# Patient Record
Sex: Male | Born: 1950 | Race: Black or African American | Hispanic: No | Marital: Single | Smoking: Never smoker
Health system: Southern US, Community
[De-identification: ages and names within clinical notes are randomized; demographics above are authoritative.]

## PROBLEM LIST (undated history)

## (undated) DIAGNOSIS — R569 Unspecified convulsions: Secondary | ICD-10-CM

## (undated) DIAGNOSIS — R7309 Other abnormal glucose: Secondary | ICD-10-CM

## (undated) DIAGNOSIS — I639 Cerebral infarction, unspecified: Secondary | ICD-10-CM

## (undated) DIAGNOSIS — I69391 Dysphagia following cerebral infarction: Secondary | ICD-10-CM

## (undated) DIAGNOSIS — N390 Urinary tract infection, site not specified: Secondary | ICD-10-CM

## (undated) DIAGNOSIS — D62 Acute posthemorrhagic anemia: Secondary | ICD-10-CM

## (undated) DIAGNOSIS — E139 Other specified diabetes mellitus without complications: Secondary | ICD-10-CM

## (undated) DIAGNOSIS — I6930 Unspecified sequelae of cerebral infarction: Secondary | ICD-10-CM

## (undated) DIAGNOSIS — E7800 Pure hypercholesterolemia, unspecified: Secondary | ICD-10-CM

## (undated) DIAGNOSIS — E119 Type 2 diabetes mellitus without complications: Secondary | ICD-10-CM

## (undated) DIAGNOSIS — I10 Essential (primary) hypertension: Secondary | ICD-10-CM

## (undated) HISTORY — DX: Unspecified convulsions: R56.9

## (undated) HISTORY — DX: Other abnormal glucose: R73.09

## (undated) HISTORY — DX: Pure hypercholesterolemia, unspecified: E78.00

---

## 2018-03-30 ENCOUNTER — Inpatient Hospital Stay (HOSPITAL_COMMUNITY): Payer: Medicare Other

## 2018-03-30 ENCOUNTER — Encounter (HOSPITAL_COMMUNITY): Payer: Self-pay | Admitting: Registered Nurse

## 2018-03-30 ENCOUNTER — Emergency Department (HOSPITAL_COMMUNITY): Payer: Medicare Other

## 2018-03-30 ENCOUNTER — Encounter (HOSPITAL_COMMUNITY)
Admission: EM | Disposition: A | Discharge status: Rehabilitation Facility | Payer: Self-pay | Source: Home / Self Care | Attending: Neurology

## 2018-03-30 ENCOUNTER — Other Ambulatory Visit: Payer: Self-pay

## 2018-03-30 ENCOUNTER — Inpatient Hospital Stay (HOSPITAL_COMMUNITY)
Admission: EM | Admit: 2018-03-30 | Discharge: 2018-04-04 | DRG: 061 | Disposition: A | Discharge status: Rehabilitation Facility | Payer: Medicare Other | Attending: Neurology | Admitting: Neurology

## 2018-03-30 ENCOUNTER — Encounter (HOSPITAL_COMMUNITY): Payer: Self-pay

## 2018-03-30 DIAGNOSIS — N390 Urinary tract infection, site not specified: Secondary | ICD-10-CM

## 2018-03-30 DIAGNOSIS — I63312 Cerebral infarction due to thrombosis of left middle cerebral artery: Secondary | ICD-10-CM | POA: Diagnosis not present

## 2018-03-30 DIAGNOSIS — I63412 Cerebral infarction due to embolism of left middle cerebral artery: Secondary | ICD-10-CM | POA: Diagnosis present

## 2018-03-30 DIAGNOSIS — I63512 Cerebral infarction due to unspecified occlusion or stenosis of left middle cerebral artery: Secondary | ICD-10-CM | POA: Diagnosis not present

## 2018-03-30 DIAGNOSIS — G8191 Hemiplegia, unspecified affecting right dominant side: Secondary | ICD-10-CM | POA: Diagnosis present

## 2018-03-30 DIAGNOSIS — N3090 Cystitis, unspecified without hematuria: Secondary | ICD-10-CM | POA: Diagnosis not present

## 2018-03-30 DIAGNOSIS — Z8673 Personal history of transient ischemic attack (TIA), and cerebral infarction without residual deficits: Secondary | ICD-10-CM

## 2018-03-30 DIAGNOSIS — E1165 Type 2 diabetes mellitus with hyperglycemia: Secondary | ICD-10-CM | POA: Diagnosis present

## 2018-03-30 DIAGNOSIS — R1310 Dysphagia, unspecified: Secondary | ICD-10-CM | POA: Diagnosis present

## 2018-03-30 DIAGNOSIS — G936 Cerebral edema: Secondary | ICD-10-CM | POA: Diagnosis present

## 2018-03-30 DIAGNOSIS — R0989 Other specified symptoms and signs involving the circulatory and respiratory systems: Secondary | ICD-10-CM | POA: Diagnosis not present

## 2018-03-30 DIAGNOSIS — E785 Hyperlipidemia, unspecified: Secondary | ICD-10-CM | POA: Diagnosis present

## 2018-03-30 DIAGNOSIS — R29810 Facial weakness: Secondary | ICD-10-CM | POA: Diagnosis present

## 2018-03-30 DIAGNOSIS — R29728 NIHSS score 28: Secondary | ICD-10-CM | POA: Diagnosis present

## 2018-03-30 DIAGNOSIS — I69398 Other sequelae of cerebral infarction: Secondary | ICD-10-CM

## 2018-03-30 DIAGNOSIS — I69391 Dysphagia following cerebral infarction: Secondary | ICD-10-CM

## 2018-03-30 DIAGNOSIS — E1142 Type 2 diabetes mellitus with diabetic polyneuropathy: Secondary | ICD-10-CM | POA: Diagnosis not present

## 2018-03-30 DIAGNOSIS — I6389 Other cerebral infarction: Secondary | ICD-10-CM | POA: Diagnosis not present

## 2018-03-30 DIAGNOSIS — E1159 Type 2 diabetes mellitus with other circulatory complications: Secondary | ICD-10-CM | POA: Diagnosis present

## 2018-03-30 DIAGNOSIS — Z794 Long term (current) use of insulin: Secondary | ICD-10-CM

## 2018-03-30 DIAGNOSIS — I639 Cerebral infarction, unspecified: Secondary | ICD-10-CM | POA: Diagnosis present

## 2018-03-30 DIAGNOSIS — I10 Essential (primary) hypertension: Secondary | ICD-10-CM | POA: Diagnosis present

## 2018-03-30 DIAGNOSIS — R509 Fever, unspecified: Secondary | ICD-10-CM

## 2018-03-30 DIAGNOSIS — E139 Other specified diabetes mellitus without complications: Secondary | ICD-10-CM | POA: Diagnosis present

## 2018-03-30 DIAGNOSIS — I6930 Unspecified sequelae of cerebral infarction: Secondary | ICD-10-CM

## 2018-03-30 DIAGNOSIS — N179 Acute kidney failure, unspecified: Secondary | ICD-10-CM | POA: Diagnosis present

## 2018-03-30 DIAGNOSIS — H5347 Heteronymous bilateral field defects: Secondary | ICD-10-CM | POA: Diagnosis present

## 2018-03-30 DIAGNOSIS — I69320 Aphasia following cerebral infarction: Secondary | ICD-10-CM | POA: Diagnosis not present

## 2018-03-30 DIAGNOSIS — D62 Acute posthemorrhagic anemia: Secondary | ICD-10-CM | POA: Diagnosis not present

## 2018-03-30 DIAGNOSIS — E1169 Type 2 diabetes mellitus with other specified complication: Secondary | ICD-10-CM | POA: Diagnosis present

## 2018-03-30 DIAGNOSIS — I69998 Other sequelae following unspecified cerebrovascular disease: Secondary | ICD-10-CM | POA: Diagnosis not present

## 2018-03-30 DIAGNOSIS — I152 Hypertension secondary to endocrine disorders: Secondary | ICD-10-CM | POA: Diagnosis present

## 2018-03-30 DIAGNOSIS — R4701 Aphasia: Secondary | ICD-10-CM | POA: Diagnosis present

## 2018-03-30 DIAGNOSIS — N289 Disorder of kidney and ureter, unspecified: Secondary | ICD-10-CM

## 2018-03-30 DIAGNOSIS — I6300 Cerebral infarction due to thrombosis of unspecified precerebral artery: Secondary | ICD-10-CM | POA: Diagnosis not present

## 2018-03-30 DIAGNOSIS — R7309 Other abnormal glucose: Secondary | ICD-10-CM | POA: Diagnosis not present

## 2018-03-30 DIAGNOSIS — I69351 Hemiplegia and hemiparesis following cerebral infarction affecting right dominant side: Secondary | ICD-10-CM | POA: Diagnosis not present

## 2018-03-30 DIAGNOSIS — E669 Obesity, unspecified: Secondary | ICD-10-CM | POA: Diagnosis not present

## 2018-03-30 HISTORY — DX: Essential (primary) hypertension: I10

## 2018-03-30 HISTORY — DX: Cerebral infarction, unspecified: I63.9

## 2018-03-30 HISTORY — DX: Type 2 diabetes mellitus without complications: E11.9

## 2018-03-30 HISTORY — PX: IR PATIENT EVAL TECH 0-60 MINS: IMG5564

## 2018-03-30 LAB — PROTIME-INR
INR: 1.04
Prothrombin Time: 13.5 seconds (ref 11.4–15.2)

## 2018-03-30 LAB — DIFFERENTIAL
Abs Immature Granulocytes: 0 10*3/uL (ref 0.00–0.07)
Basophils Absolute: 0 10*3/uL (ref 0.0–0.1)
Basophils Relative: 0 %
Eosinophils Absolute: 0 10*3/uL (ref 0.0–0.5)
Eosinophils Relative: 0 %
Lymphocytes Relative: 41 %
Lymphs Abs: 2.4 10*3/uL (ref 0.7–4.0)
Monocytes Absolute: 0.3 10*3/uL (ref 0.1–1.0)
Monocytes Relative: 5 %
NEUTROS ABS: 3.2 10*3/uL (ref 1.7–7.7)
Neutrophils Relative %: 54 %
nRBC: 0 /100 WBC

## 2018-03-30 LAB — GLUCOSE, CAPILLARY
Glucose-Capillary: 306 mg/dL — ABNORMAL HIGH (ref 70–99)
Glucose-Capillary: 314 mg/dL — ABNORMAL HIGH (ref 70–99)
Glucose-Capillary: 203 mg/dL — ABNORMAL HIGH (ref 70–99)

## 2018-03-30 LAB — CBC
HCT: 45 % (ref 39.0–52.0)
Hemoglobin: 13.9 g/dL (ref 13.0–17.0)
MCH: 28.1 pg (ref 26.0–34.0)
MCHC: 30.9 g/dL (ref 30.0–36.0)
MCV: 91.1 fL (ref 80.0–100.0)
PLATELETS: 224 10*3/uL (ref 150–400)
RBC: 4.94 MIL/uL (ref 4.22–5.81)
RDW: 12.6 % (ref 11.5–15.5)
WBC: 5.9 10*3/uL (ref 4.0–10.5)
nRBC: 0 % (ref 0.0–0.2)

## 2018-03-30 LAB — CBG MONITORING, ED: Glucose-Capillary: 219 mg/dL — ABNORMAL HIGH (ref 70–99)

## 2018-03-30 LAB — COMPREHENSIVE METABOLIC PANEL
ALBUMIN: 4.1 g/dL (ref 3.5–5.0)
ALT: 25 U/L (ref 0–44)
AST: 24 U/L (ref 15–41)
Alkaline Phosphatase: 69 U/L (ref 38–126)
Anion gap: 9 (ref 5–15)
BUN: 23 mg/dL (ref 8–23)
CO2: 23 mmol/L (ref 22–32)
Calcium: 9.4 mg/dL (ref 8.9–10.3)
Chloride: 105 mmol/L (ref 98–111)
Creatinine, Ser: 1.8 mg/dL — ABNORMAL HIGH (ref 0.61–1.24)
GFR calc Af Amer: 44 mL/min — ABNORMAL LOW (ref >60)
GFR calc non Af Amer: 38 mL/min — ABNORMAL LOW (ref >60)
Glucose, Bld: 243 mg/dL — ABNORMAL HIGH (ref 70–99)
Potassium: 3.7 mmol/L (ref 3.5–5.1)
Sodium: 137 mmol/L (ref 135–145)
Total Bilirubin: 0.9 mg/dL (ref 0.3–1.2)
Total Protein: 7.1 g/dL (ref 6.5–8.1)

## 2018-03-30 LAB — APTT: aPTT: 25 seconds (ref 24–36)

## 2018-03-30 LAB — I-STAT CHEM 8, ED
BUN: 24 mg/dL — ABNORMAL HIGH (ref 8–23)
Calcium, Ion: 1.15 mmol/L (ref 1.15–1.40)
Chloride: 104 mmol/L (ref 98–111)
Creatinine, Ser: 1.8 mg/dL — ABNORMAL HIGH (ref 0.61–1.24)
Glucose, Bld: 247 mg/dL — ABNORMAL HIGH (ref 70–99)
HCT: 43 % (ref 39.0–52.0)
Hemoglobin: 14.6 g/dL (ref 13.0–17.0)
POTASSIUM: 3.6 mmol/L (ref 3.5–5.1)
Sodium: 139 mmol/L (ref 135–145)
TCO2: 24 mmol/L (ref 22–32)

## 2018-03-30 LAB — ECHOCARDIOGRAM COMPLETE
Height: 74 in
Weight: 3171.1 oz

## 2018-03-30 LAB — I-STAT TROPONIN, ED: Troponin i, poc: 0.01 ng/mL (ref 0.00–0.08)

## 2018-03-30 LAB — MRSA PCR SCREENING: MRSA by PCR: NEGATIVE

## 2018-03-30 SURGERY — IR WITH ANESTHESIA
Anesthesia: General

## 2018-03-30 MED ORDER — ALTEPLASE (STROKE) FULL DOSE INFUSION
0.9000 mg/kg | Freq: Once | INTRAVENOUS | Status: AC
  Administered 2018-03-30: 80.9 mg via INTRAVENOUS
  Filled 2018-03-30: qty 80.9

## 2018-03-30 MED ORDER — ACETAMINOPHEN 650 MG RE SUPP
650.0000 mg | RECTAL | Status: DC | PRN
  Administered 2018-03-31 – 2018-04-01 (×3): 650 mg via RECTAL
  Filled 2018-03-30 (×4): qty 1

## 2018-03-30 MED ORDER — PANTOPRAZOLE SODIUM 40 MG IV SOLR
40.0000 mg | Freq: Every day | INTRAVENOUS | Status: DC
  Administered 2018-03-30 – 2018-04-01 (×3): 40 mg via INTRAVENOUS
  Filled 2018-03-30 (×3): qty 40

## 2018-03-30 MED ORDER — NICARDIPINE HCL IN NACL 20-0.86 MG/200ML-% IV SOLN
0.0000 mg/h | INTRAVENOUS | Status: DC | PRN
  Administered 2018-03-30: 5 mg/h via INTRAVENOUS
  Administered 2018-03-30 (×2): 7.5 mg/h via INTRAVENOUS
  Administered 2018-03-31 (×7): 10 mg/h via INTRAVENOUS
  Administered 2018-03-31: 7 mg/h via INTRAVENOUS
  Administered 2018-03-31: 10 mg/h via INTRAVENOUS
  Administered 2018-04-01: 7.5 mg/h via INTRAVENOUS
  Administered 2018-04-01 (×2): 10 mg/h via INTRAVENOUS
  Filled 2018-03-30 (×18): qty 200

## 2018-03-30 MED ORDER — INSULIN ASPART 100 UNIT/ML ~~LOC~~ SOLN
0.0000 [IU] | Freq: Three times a day (TID) | SUBCUTANEOUS | Status: DC
  Administered 2018-03-30: 11 [IU] via SUBCUTANEOUS
  Administered 2018-03-31: 3 [IU] via SUBCUTANEOUS
  Administered 2018-03-31 – 2018-04-01 (×2): 2 [IU] via SUBCUTANEOUS
  Administered 2018-04-01: 5 [IU] via SUBCUTANEOUS
  Administered 2018-04-01: 3 [IU] via SUBCUTANEOUS
  Administered 2018-04-02: 5 [IU] via SUBCUTANEOUS
  Administered 2018-04-02 (×2): 3 [IU] via SUBCUTANEOUS
  Administered 2018-04-03 (×2): 5 [IU] via SUBCUTANEOUS
  Administered 2018-04-03: 8 [IU] via SUBCUTANEOUS
  Administered 2018-04-04: 3 [IU] via SUBCUTANEOUS
  Administered 2018-04-04: 5 [IU] via SUBCUTANEOUS

## 2018-03-30 MED ORDER — IOPAMIDOL (ISOVUE-370) INJECTION 76%
80.0000 mL | Freq: Once | INTRAVENOUS | Status: AC | PRN
  Administered 2018-03-30: 80 mL via INTRAVENOUS

## 2018-03-30 MED ORDER — LABETALOL HCL 5 MG/ML IV SOLN
10.0000 mg | Freq: Once | INTRAVENOUS | Status: AC | PRN
  Administered 2018-03-31: 10 mg via INTRAVENOUS
  Filled 2018-03-30: qty 4

## 2018-03-30 MED ORDER — SODIUM CHLORIDE 0.9 % IV SOLN
50.0000 mL/h | INTRAVENOUS | Status: DC
  Administered 2018-03-30 – 2018-03-31 (×2): 50 mL/h via INTRAVENOUS

## 2018-03-30 MED ORDER — STROKE: EARLY STAGES OF RECOVERY BOOK
Freq: Once | Status: AC
  Administered 2018-03-30: 1
  Filled 2018-03-30 (×2): qty 1

## 2018-03-30 MED ORDER — ACETAMINOPHEN 160 MG/5ML PO SOLN: 650.0000 mg | ORAL | Status: DC | PRN

## 2018-03-30 MED ORDER — ACETAMINOPHEN 325 MG PO TABS
650.0000 mg | ORAL_TABLET | ORAL | Status: DC | PRN
  Administered 2018-04-01 – 2018-04-02 (×3): 650 mg via ORAL
  Filled 2018-03-30 (×3): qty 2

## 2018-03-30 NOTE — Progress Notes (Signed)
Patient arrived on 4N ICU to room 18 at 1317 from ED. CHG bath done, sacral foam placed, MRSA swab done, and connected to monitor. 2 IV's in place and Foley catheter.

## 2018-03-30 NOTE — ED Triage Notes (Signed)
Pt from home with c/o R sided facial droop, R sided wkness, AMS, and vomiting x 1 per EMS; pt LSW 0900 per EMS and walked downstairs for bkfst; pt has hx of CVA with bilat wkness and can move all extremities; no blood thinners; CBG 273, 111/68, 74, 100% on RA, 14; no access

## 2018-03-30 NOTE — Progress Notes (Signed)
*  PRELIMINARY RESULTS* Echocardiogram 2D Echocardiogram has been performed.  Jeryl Columbialliott, Shamel Galyean 03/30/2018, 4:03 PM

## 2018-03-30 NOTE — H&P (Signed)
Neurology H&P  CC: Aphasia  History is obtained from: Family  HPI: Gamble Enderle is a 67 y.o. male with a history of previous stroke, hypertension, diabetes, hyperlipidemia who presents with right-sided weakness and aphasia that started abruptly at 9 AM.  He was in his normal state of health and came downstairs and when his eating breakfast when he suddenly stopped responding to his daughters.  He is here visiting from [place] for Thanksgiving.    LKW: 9 AM tpa given?:  Yes Modified Rankin Scale: 3-Moderate disability-requires help but walks WITHOUT assistance  ROS:  Unable to obtain due to altered mental status.   Past medical history: Diabetes Hypertension Hyperlipidemia   Family history: Unable to obtain due to altered mental status   Social History: Lives in [place] with family, here visiting.   Exam: Current vital signs: BP 112/67 (BP Location: Left Arm)   Ht 6\' 2"  (1.88 m)   Wt 89.9 kg   BMI 25.45 kg/m  Vital signs in last 24 hours: BP: (112)/(67) 112/67 (11/29 1026) Weight:  [89.9 kg] 89.9 kg (11/29 1000)  Physical Exam  Constitutional: Appears well-developed and well-nourished.  Psych: Affect appropriate to situation Eyes: No scleral injection HENT: No OP obstrucion Head: Normocephalic.  Cardiovascular: Normal rate and regular rhythm.  Respiratory: Effort normal and breath sounds normal to anterior ascultation GI: Soft.  No distension. There is no tenderness.  Skin: WDI  Neuro: Mental Status: Patient is awake, alert, completely a phasic, says yes repeatedly but does not follow commands Cranial Nerves: II: Right hemianopia pupils are equal, round, and reactive to light.   III,IV, VI: Left gaze preference  VII: Significant right facial weakness  Motor: He has 2/5 weakness of the right upper extremity 1/5 in right lower extremity, he moves his left side against gravity and spontaneously with good strength sensory: He does not respond to noxious  stimulation as much on the right his left  Cerebellar: Does not perform  I have reviewed labs in epic and the results pertinent to this consultation are: CMP creatinine 1.8 CBC-unremarkable   I have reviewed the images obtained: CTA/CTP-large infarct with only slightly larger penumbra.  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  His MRS was 3 already placing him outside the typical criteria for intervention and on CTP/MRI the entire MCA distribution is already infarcted suggesting that interventional radiology would not be of benefit to him.  He is already received IV TPA.  Given the multifocal stenosis of his intracranial vessels I suspect that this was due to large vessel atherosclerosis, though the lack of collateralization would suggest that embolism is also certainly possible.  I suspect that he is going to have significant cerebral edema, I broached the concept of DNR with the family, but they are not ready to make a decision such as that at this time.  Recommendations: - HgbA1c, fasting lipid panel - MRI  of the brain without contrast at the 24-hour mark - Frequent neuro checks - Echocardiogram - Prophylactic therapy-none for 24 hours- Risk factor modification - Telemetry monitoring - PT consult, OT consult, Speech consult - Stroke team to follow - SSI for DM -Labetalol/nicardipine as needed for hypertension  This patient is critically ill and at significant  risk of neurological worsening, death and care requires constant monitoring of vital signs, hemodynamics,respiratory and cardiac monitoring, neurological assessment, discussion with family, other specialists and  medical decision making of high complexity. I spent 90 minutes of neurocritical care time  in the care of  this patient.  Ritta SlotMcNeill Kayona Foor, MD [facility] [phone]  If 7pm- 7am, please page neurology on call as listed in AMION. 03/30/2018  11:51 AM

## 2018-03-30 NOTE — ED Provider Notes (Signed)
[facility] EMERGENCY DEPARTMENT Provider Note   CSN: [id number] Arrival date & time: 03/30/18  1024     History   Chief Complaint Chief Complaint  Patient presents with  . Code Stroke    HPI Hershy Flenner is a 67 y.o. male. Level 5 caveat secondary to sever illness,inablity to communicate HPI 67 yo male with unknown history who presents today with report that he had sudden onset of right sided weakness, ltered mental status, and vomited x1 this am.  [place].  REport is from EMS and no family is present on initial evaluation.  Prehospital blood sugar was reported to be 273 and no report of seizure has been given patient is not speaking. 12:00 PM Is at bedside.  They state patient last known normal at 9 AM this morning.  He is generally weak from previous stroke.  He was eating breakfast when he became abruptly weak on the right side and had an episode of vomiting. History reviewed. No pertinent past medical history.  There are no active problems to display for this patient.    Past medical history- Not known Home Medications   Not known Prior to Admission medications   Not on File   Not known Family History History reviewed. No pertinent family history.  Social History Social History   Tobacco Use  . Smoking status: Not on file  Substance Use Topics  . Alcohol use: Not on file  . Drug use: Not on file   unknown  Allergies   Patient has no allergy information on record.   Review of Systems Review of Systems  Unable to perform ROS: Patient nonverbal     Physical Exam Updated Vital Signs BP 112/67 (BP Location: Left Arm)   Ht 1.88 m (6\' 2" )   Wt 89.9 kg   BMI 25.45 kg/m  Patient in scanner on my initial exam  Physical Exam  Constitutional: He appears well-developed and well-nourished. No distress.  HENT:  Head: Normocephalic.  Neck: Normal range of motion.  Cardiovascular: Normal rate and  regular rhythm.  Pulmonary/Chest: Effort normal.  Breath sounds decreased bilaterally   Abdominal: Soft.  Musculoskeletal: Normal range of motion.  Neurological: He is alert. He displays normal reflexes. A cranial nerve deficit is present. He exhibits normal muscle tone. Coordination normal.  Skin: Skin is warm. Capillary refill takes less than 2 seconds.  Nursing note and vitals reviewed.    ED Treatments / Results  Labs (all labs ordered are listed, but only abnormal results are displayed) Labs Reviewed  CBG MONITORING, ED - Abnormal; Notable for the following components:      Result Value   Glucose-Capillary 219 (*)    All other components within normal limits  I-STAT CHEM 8, ED - Abnormal; Notable for the following components:   BUN 24 (*)    Creatinine, Ser 1.80 (*)    Glucose, Bld 247 (*)    All other components within normal limits  PROTIME-INR  APTT  CBC  DIFFERENTIAL  COMPREHENSIVE METABOLIC PANEL  I-STAT TROPONIN, ED    EKG EKG Interpretation  Date/Time:  Friday March 30 2018 11:56:38 EST Ventricular Rate:  85 PR Interval:    QRS Duration: 98 QT Interval:  374 QTC Calculation: 445 R Axis:   74 Text Interpretation:  Normal sinus rhythm Non-specific ST-t changes No old tracing to compare Confirmed by Margarita Grizzle [phone]) on 03/30/2018 12:02:41 PM   Radiology Ct Head Code Stroke  Wo Contrast  Result Date: 03/30/2018 CLINICAL DATA:  Code stroke. Code stroke. Left arm and leg weakness. Left facial numbness. Symptoms resolved in 15 minutes. EXAM: CT HEAD WITHOUT CONTRAST TECHNIQUE: Contiguous axial images were obtained from the base of the skull through the vertex without intravenous contrast. COMPARISON:  None. FINDINGS: Brain: Moderate diffuse white matter disease is present. A remote left PCA territory infarct is present. Ex vacuo dilation of the left lateral ventricle is evident. No acute or focal cortical abnormality is present. White matter changes extend  into the brainstem. Cerebellum is unremarkable. No significant extra-axial fluid collections are present. Basal ganglia are intact.  Insular ribbon is normal. Vascular: Dense atherosclerotic calcifications are present at the dural margin of the right greater than left vertebral arteries. Right greater than left cavernous calcifications are also present. There is no asymmetric hyperdense vessel. Skull: Calvarium is intact. No focal lytic or blastic lesions are present. Sinuses/Orbits: The paranasal sinuses and mastoid air cells are clear. Globes and orbits are within normal limits. ASPECTS [facility]([place] Stroke Program Early CT Score) - Ganglionic level infarction (caudate, lentiform nuclei, internal capsule, insula, M1-M3 cortex): 7/7 - Supraganglionic infarction (M4-M6 cortex): 3/3 Total score (0-10 with 10 being normal): 10/10 IMPRESSION: 1. Remote left PCA territory nonhemorrhagic infarct 2. Moderate diffuse white matter disease bilaterally. 3. No acute cortical abnormality. 4. ASPECTS is 10/10 These results were called by telephone at the time of interpretation on 03/30/2018 at 10:44 am to Dr. Amada JupiterKIRKPATRICK, who verbally acknowledged these results. Electronically Signed   By: Marin Robertshristopher  Mattern M.D.   On: 03/30/2018 10:45    Procedures .Critical Care Performed by: Margarita Grizzleay, Fenix Ruppe, MD Authorized by: Margarita Grizzleay, Trevione Wert, MD   Critical care provider statement:    Critical care time (minutes):  30   Critical care was necessary to treat or prevent imminent or life-threatening deterioration of the following conditions:  CNS failure or compromise and dehydration   Critical care was time spent personally by me on the following activities:  Discussions with consultants, evaluation of patient's response to treatment, examination of patient, ordering and performing treatments and interventions, ordering and review of laboratory studies, ordering and review of radiographic studies, pulse oximetry, re-evaluation of patient's  condition, obtaining history from patient or surrogate and review of old charts   (including critical care time)  Medications Ordered in ED Medications  iopamidol (ISOVUE-370) 76 % injection 80 mL (80 mLs Intravenous Contrast Given 03/30/18 1040)     Initial Impression / Assessment and Plan / ED Course  I have reviewed the triage vital signs and the nursing notes.  Pertinent labs & imaging results that were available during my care of the patient were reviewed by me and considered in my medical decision making (see chart for details).    Patient with acute large mca stroke presented here as code stroke.  Dr. Amada JupiterKirkpatrick comanaged from arrival.  Patient appears to be managing airway and Dr.Kirpatrick does not want patient intubated at this time. Patient taken to ir but procedure aborted due to completion of infarct. Patient received iv tpa and will be admitted by neurology to neuro icu.  Patient with borderline hypotenison in ED and receiving iv fluids.  1- acute left mca stroke 2- elevated creatinine- baseline unkown, but patient with some apparent volume depleiton with decreased bp and hydration given here 3- hyperglycemia- bs 247 Vitals:   03/30/18 1026 03/30/18 1100  BP: 112/67 122/77  Pulse:  80  Resp:  16  SpO2:  100%  Final Clinical Impressions(s) / ED Diagnoses   Final diagnoses:  Acute embolic stroke [facility])    ED Discharge Orders    None       Margarita Grizzle, MD 03/30/18 1214

## 2018-03-30 NOTE — Procedures (Signed)
IR code stroke team activated, opened supplies, and getting procedure prepared to start and code stroke for IR team cancelled.

## 2018-03-30 NOTE — Code Documentation (Signed)
67 year old male presents to [facility] as code stroke called by EMS in the field.  EMS reports family states patient is here visiting from [place] - he has previous history of a stroke with some residual bilateral weakness and some aphasia - he can communicate but does not talk fluently.  This AM family reports patient had gotten up - dressed himself -  came downstairs and ate breakfast at 0900 and was in his normal state of health.  About 1000 they noticed he was different - would not talk and had increased right side weakness.  On arrival to ED he is somewhat drowsy - will open eyes - does not follow commands - has right facial droop and right arm weakness with right leg weakness - he will not focus or follow commands.  Moves left side spontaneously and purposeful.Has left gaze preference - right field cut - right neglect and sensory loss. Met at the bridge by the stroke team and ED staff.  Was taken to CT scan after labs drawn. CBG 219.  Post initial CT scan 2 IV's were started - left antecubital #18 gauge and right hand #20 angio cath. BP was initially 110/85 - NS 500 cc bolus IV started per protocol - Dr. Leonel Ramsay aware.   CTA and CTP were performed.  Dr. Leonel Ramsay is present - speaking with daughters on the phone.  Initial NIHSS 28.  Post CT he is more alert - will attempt to verbalize but incomprehensible.  tPA ordered at 1055 - to room at 1059 - initiated at 1100 - see MAR for details.  Initial decision was to take patient to IR - foley inserted - pedals marked - and transported to IR suite.  On arrival to IR suite - Dr. Kathi Ludwig and Dr. Leonel Ramsay to bedside - IR cancelled due to CTP findings.  Transported to ED - decision made to do stat DWI/flair MRI - transported to MRI - scans completed - no intervention per Dr. Leonel Ramsay.  Transported back to ED - continued monitoring with tPA infusion.  VSS - now with some effort of right leg - right arm remains without movement although has some tone. Remains  mute - can hold left arm and leg up but not to command - does attempt to lift right leg off bed - gaze preference remains - right neglect.   Daughters in room - updated by Dr. Leonel Ramsay.  Handoff to Trai RN - for admission to ICU.

## 2018-03-30 NOTE — Progress Notes (Signed)
PHARMACIST CODE STROKE RESPONSE  Notified to mix tPA at 1055 by Dr. Amada JupiterKirkpatrick Delivered tPA to RN at 1059  tPA dose = 8.1mg  bolus over 1 minute followed by 72.8mg  for a total dose of 80.9mg  over 1 hour   Daylene PoseyJonathan  Rayelle Armor 03/30/18 11:03 AM

## 2018-03-31 ENCOUNTER — Encounter (HOSPITAL_COMMUNITY): Payer: Self-pay

## 2018-03-31 ENCOUNTER — Inpatient Hospital Stay (HOSPITAL_COMMUNITY): Payer: Medicare Other

## 2018-03-31 DIAGNOSIS — I63312 Cerebral infarction due to thrombosis of left middle cerebral artery: Secondary | ICD-10-CM

## 2018-03-31 LAB — LIPID PANEL
Cholesterol: 109 mg/dL (ref 0–200)
HDL: 36 mg/dL — ABNORMAL LOW (ref >40)
LDL Cholesterol: 60 mg/dL (ref 0–99)
TRIGLYCERIDES: 64 mg/dL (ref <150)
Total CHOL/HDL Ratio: 3 RATIO
VLDL: 13 mg/dL (ref 0–40)

## 2018-03-31 LAB — CBC
HCT: 40.4 % (ref 39.0–52.0)
Hemoglobin: 13.2 g/dL (ref 13.0–17.0)
MCH: 28.8 pg (ref 26.0–34.0)
MCHC: 32.7 g/dL (ref 30.0–36.0)
MCV: 88.2 fL (ref 80.0–100.0)
Platelets: 214 10*3/uL (ref 150–400)
RBC: 4.58 MIL/uL (ref 4.22–5.81)
RDW: 12.6 % (ref 11.5–15.5)
WBC: 9.3 10*3/uL (ref 4.0–10.5)
nRBC: 0 % (ref 0.0–0.2)

## 2018-03-31 LAB — GLUCOSE, CAPILLARY
GLUCOSE-CAPILLARY: 139 mg/dL — AB (ref 70–99)
Glucose-Capillary: 157 mg/dL — ABNORMAL HIGH (ref 70–99)
Glucose-Capillary: 166 mg/dL — ABNORMAL HIGH (ref 70–99)
Glucose-Capillary: 124 mg/dL — ABNORMAL HIGH (ref 70–99)

## 2018-03-31 LAB — SODIUM
Sodium: 140 mmol/L (ref 135–145)
Sodium: 143 mmol/L (ref 135–145)

## 2018-03-31 LAB — RAPID URINE DRUG SCREEN, HOSP PERFORMED
AMPHETAMINES: NOT DETECTED
BENZODIAZEPINES: NOT DETECTED
Barbiturates: NOT DETECTED
COCAINE: NOT DETECTED
Opiates: NOT DETECTED
Tetrahydrocannabinol: NOT DETECTED

## 2018-03-31 LAB — HEMOGLOBIN A1C
Hgb A1c MFr Bld: 7.3 % — ABNORMAL HIGH (ref 4.8–5.6)
Mean Plasma Glucose: 162.81 mg/dL

## 2018-03-31 LAB — OSMOLALITY: Osmolality: 300 mOsm/kg — ABNORMAL HIGH (ref 275–295)

## 2018-03-31 LAB — HIV ANTIBODY (ROUTINE TESTING W REFLEX): HIV SCREEN 4TH GENERATION: NONREACTIVE

## 2018-03-31 MED ORDER — ORAL CARE MOUTH RINSE
15.0000 mL | Freq: Two times a day (BID) | OROMUCOSAL | Status: DC
  Administered 2018-03-31 – 2018-04-04 (×9): 15 mL via OROMUCOSAL

## 2018-03-31 MED ORDER — SODIUM CHLORIDE 3 % IV SOLN
INTRAVENOUS | Status: DC
  Administered 2018-03-31 – 2018-04-01 (×3): 75 mL/h via INTRAVENOUS
  Administered 2018-04-01: 37.5 mL/h via INTRAVENOUS
  Filled 2018-03-31 (×7): qty 500

## 2018-03-31 NOTE — Progress Notes (Signed)
Pt. Oral Temp 100.6, gave tylenol and recheck one hour later.  Temp. 100.3.  Alerted on call Neuro MD.  Orders given to recheck as scheduled and report back.

## 2018-03-31 NOTE — Progress Notes (Signed)
STROKE TEAM PROGRESS NOTE   HISTORY OF PRESENT ILLNESS (per record) Douglas Douglas [name] is Douglas [name] 67 y.o. male with Douglas [name] history of previous stroke, hypertension, diabetes, hyperlipidemia who presents with right-sided weakness and aphasia that started abruptly at 9 AM. 0n 03/30/18  He was in his normal state of health and came downstairs and when his eating breakfast when he suddenly stopped responding to his daughters.  He is here visiting from [place] for Thanksgiving.    LKW: 9 AM tpa given?:  Yes Modified Rankin Scale: 3-Moderate disability-requires help but walks WITHOUT assistance   SUBJECTIVE (INTERVAL HISTORY) Pt is aphasic. Dr Douglas Douglas [name] spoke at length with his 2 daughters.he remains neurologically stable. Blood pressure is well controlled. He is scheduled to have follow-up MRI this morning   OBJECTIVE Vitals:   03/31/18 1015 03/31/18 1029 03/31/18 1045 03/31/18 1100  BP: (!) 185/67 (!) 171/75  (!) 155/70  Pulse:  (!) 106 (!) 107 (!) 102  Resp:   16 16  Temp:    99.4 F (37.4 C)  TempSrc:    Oral  SpO2:   100% 100%  Weight:      Height:        CBC:  Recent Labs  Lab 03/30/18 1026 03/30/18 1036 03/31/18 0223  WBC 5.9  --  9.3  NEUTROABS 3.2  --   --   HGB 13.9 14.6 13.2  HCT 45.0 43.0 40.4  MCV 91.1  --  88.2  PLT 224  --  214    Basic Metabolic Panel:  Recent Labs  Lab 03/30/18 1026 03/30/18 1036 03/31/18 1103  NA 137 139 140  K 3.7 3.6  --   CL 105 104  --   CO2 23  --   --   GLUCOSE 243* 247*  --   BUN 23 24*  --   CREATININE 1.80* 1.80*  --   CALCIUM 9.4  --   --     Lipid Panel:     Component Value Date/Time   CHOL 109 03/31/2018 0223   TRIG 64 03/31/2018 0223   HDL 36 (L) 03/31/2018 0223   CHOLHDL 3.0 03/31/2018 0223   VLDL 13 03/31/2018 0223   LDLCALC 60 03/31/2018 0223   HgbA1c:  Lab Results  Component Value Date   HGBA1C 7.3 (H) 03/31/2018   Urine Drug Screen:     Component Value Date/Time   LABOPIA NONE DETECTED 03/31/2018 0927    COCAINSCRNUR NONE DETECTED 03/31/2018 0927   LABBENZ NONE DETECTED 03/31/2018 0927   AMPHETMU NONE DETECTED 03/31/2018 0927   THCU NONE DETECTED 03/31/2018 0927   LABBARB NONE DETECTED 03/31/2018 0927    Alcohol Level No results found for: ETH  IMAGING  Ct Angio Head W Or Wo Contrast Ct Angio Neck W Or Wo Contrast Ct Cerebral Perfusion W Contrast 03/30/2018  IMPRESSION:  1. Large left MCA territory infarct with extremely poor collaterals.  2. Emergent large vessel occlusion of the proximal left M1 segment.  3. Moderate to high-grade segmental stenoses of the right M1 segment.  4. Moderate proximal A1 stenoses bilaterally.  5. High-grade stenosis and occlusion of the right vertebral artery at the dural margin and V4 segment.  6. High-grade stenosis of the left vertebral artery with small irregular basilar artery feeding the residual right PCA.  7. High-grade stenosis of distal right P2 segment.  8. 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     Mr Brain Wo Contrast  03/31/2018 IMPRESSION:  1. Evolving large acute left MCA infarct with new cytotoxic edema and minimal petechial hemorrhage. No midline shift.  2. Large chronic left PCA infarct.  3. Severe chronic small vessel ischemic disease.  Coexistent multiple sclerosis is not excluded by imaging.    Mr Brain Wo Contrast 03/30/2018 IMPRESSION:  1. Acute nonhemorrhagic large left MCA territory infarct.  2. Remote left PCA territory infarct.  3. Advanced white matter disease consistent with chronic microvascular ischemia.  4. Vascular changes as above.    Ir Patient Eval Tech 0-60 Mins 03/30/2018 Douglas Douglas [name]  03/30/2018 11:19 AM IR code stroke team activated, opened supplies, and getting procedure prepared to start and code stroke for IR team cancelled.   Ct Head Code Stroke Wo Contrast 03/30/2018 IMPRESSION:  1. Remote left PCA territory nonhemorrhagic  infarct  2. Moderate diffuse white matter disease bilaterally.  3. No acute cortical abnormality.  4. ASPECTS is 10/10     Transthoracic Echocardiogram  03/30/2018 Study Conclusions - Left ventricle: The cavity size was normal. Wall thickness was   increased in Douglas [name] pattern of moderate LVH. Systolic function was   normal. The estimated ejection fraction was in the range of 60%   to 65%. Wall motion was normal; there were no regional wall   motion abnormalities. Doppler parameters are consistent with   abnormal left ventricular relaxation (grade 1 diastolic   dysfunction). The Ee&' ratio is between 8-15, suggesting   indeterminate LV filling pressure. - Left atrium: The atrium was normal in size. - Atrial septum: No defect or patent foramen ovale was identified. - Inferior vena cava: The vessel was normal in size. The   respirophasic diameter changes were in the normal range (>= 50%),   consistent with normal central venous pressure. Impressions: - LVEF 60-65%, moderate LVH, normal wall motion, grade 1 DD,   indeterminate LV filling pressure, normal LA size, normal IVC.     PHYSICAL EXAM Blood pressure (!) 155/70, pulse (!) 102, temperature 99.4 F (37.4 C), temperature source Oral, resp. rate 16, height 6\' 2"  (1.88 m), weight 86.7 kg, SpO2 100 %.  Pleasant middle-aged African-American male currently not in distress. . Afebrile. Head is nontraumatic. Neck is supple without bruit.    Cardiac exam no murmur or gallop. Lungs are clear to auscultation. Distal pulses are well felt. Neurological Exam :  Awake alert globally aphasic. Speaks only occasional words but not sentences. Will follow only simple midline and occasional one-step commands. Blinks to threat on the left but not the right. Left gaze preference but able to look to the right past midline. Right lower facial weakness. Dense right hemiplegia with only trace withdrawal in the right lower extremity. Antigravity strength on the  left side with intermittent action tremor of the left upper extremity. Tone is diminished on the right and deep tendon reflexes are depressed on the right. Right plantar upgoing left downgoing. Gait not tested.       ASSESSMENT/PLAN Douglas Douglas [name] is Douglas [name] 67 y.o. male with history of previous stroke, hypertension, diabetes, hyperlipidemia who presents with right-sided weakness and aphasia.  IV tPA 03/30/2018 at 11:59 AM  Stroke:  Large left MCA territory infarct - possibly embolic - unknown source  Resultant  Global aphasia and right hemiplegia  CT head - Remote left PCA territory nonhemorrhagic infarct   MRI head - Acute nonhemorrhagic large left MCA territory infarct.   MRA head - not performed  CTA H&N - diffuse cerebrovascular disease as  described above.  Carotid Doppler - CTA neck performed - carotid dopplers not indicated.  2D Echo - EF 60 - 65%. No cardiac source of emboli identified.   LDL - 60  HgbA1c - 7.3  UDS - negative  VTE prophylaxis - SCDs  Diet - NPO  clopidogrel 75 mg daily prior to admission, now on No antithrombotic S/P tPA  Ongoing aggressive stroke risk factor management  Therapy recommendations:  pending  Disposition:  Pending  Hypertension  Stable (Cardene) . SBP goal < 180 mmHg initially after tPA . Long-term BP goal normotensive  Hyperlipidemia  Lipid lowering medication PTA:  Lipitor 20 mg daily  LDL 60, goal < 70  Current lipid lowering medication: NPO  Continue statin at discharge   Other Stroke Risk Factors  Advanced age  Hx stroke/TIA   Other Active Problems  Creatinine -1.80 - monitor  Hyperglycemia - monitor - monitor - may need to increase insulin  Hypertonic saline 3% at 75 cc/hr - peripheral IV  Plan  Add ASA suppository if F/U imaging is without hemorrhage until swallowing is cleared.  Start hypertonic saline  Check MRI   Hospital day # 1  Delton SeeDavid Rinehuls PA-C [facility] Pager [phone] 03/31/2018, 2:13 PM I have personally examined this patient, reviewed notes, independently viewed imaging studies, participated in medical decision making and plan of care.ROS completed by me personally and pertinent positives fully documented  I have made any additions or clarifications directly to the above note. Agree with note above.  He presented with aphasia and right hemiplegia due to large left MCA infarct due to left M1 occlusion but unfortunately pupils significantly large core infarct on CT perfusion was not Douglas [name] candidate for mechanical thrombectomy. He did receive TPA. Continue strict blood pressure control and close neurological monitoring as per post TPA protocol. Start hypertonic saline or cytotoxic edema with serum sodium goal 150-155. Long discussion at the bedside with the patient's 2 daughters who wanted full support and aggressive care. I discussed patient's poor prognosis and large infarct size with them and answered questions. Discussed with Dr. Michae KavaAgarwal of critical care medicine.This patient is critically ill and at significant risk of neurological worsening, death and care requires constant monitoring of vital signs, hemodynamics,respiratory and cardiac monitoring, extensive review of multiple databases, frequent neurological assessment, discussion with family, other specialists and medical decision making of high complexity.I have made any additions or clarifications directly to the above note.This critical care time does not reflect procedure time, or teaching time or supervisory time of PA/NP/Med Resident etc but could involve care discussion time.  I spent 50 minutes of neurocritical care time  in the care of  this patient.      Delia HeadyPramod , MD Medical Director [facility] Pager: [phone] 03/31/2018 2:50 PM   To contact Stroke Continuity provider, please refer to [email or web address]. After hours, contact General Neurology

## 2018-03-31 NOTE — Evaluation (Addendum)
Physical Therapy Evaluation Patient Details Name: Douglas [name] MRN: [id number] DOB: 04/30/1951 Today's Date: 03/31/2018   History of Present Illness  67 y.o. male with a history of previous stroke, hypertension, diabetes, hyperlipidemia who presents with right-sided weakness and aphasia that started abruptly at 9 AM.  He was in his normal state of health and came downstairs and when his eating breakfast when he suddenly stopped responding to his daughters.  MRI brain 03/31/18 indicated Evolving large acute left MCA infarct with new cytotoxic edema and minimal petechial hemorrhage. No midline shift.  Clinical Impression  Pt admitted with/for s/s of stroke due to Large left MCA CVA with significant right sided weakness.  Basic mobility requiring mod assist of 2 persons at best..  Pt currently limited functionally due to the problems listed. ( See problems list.)   Pt will benefit from PT to maximize function and safety in order to get ready for next venue listed below. I have discussed the patient's current level of function related to pt's stroke with the pt's family.  They acknowledge understanding of this and do not feel the patient would be able to have their care needs met at home.  They are interested in post-acute rehab in an inpatient setting.      Follow Up Recommendations CIR;Supervision/Assistance - 24 hour;Other (comment)(pt from [place])    Equipment Recommendations  Other (comment)(TBA)    Recommendations for Other Services Rehab consult     Precautions / Restrictions Precautions Precautions: Fall      Mobility  Bed Mobility Overal bed mobility: Needs Assistance Bed Mobility: Supine to Sit;Sit to Supine     Supine to sit: Mod assist;+2 for safety/equipment Sit to supine: Mod assist;+2 for safety/equipment   General bed mobility comments: pt followed tactile and gestural cue to get up.  needed truncal assist and scoot assist  Transfers Overall transfer level:  Needs assistance   Transfers: Sit to/from Stand Sit to Stand: Mod assist;+2 physical assistance         General transfer comment: hand over hand cues for hand placement, assist for forward and up.  Ambulation/Gait             General Gait Details: NT  Stairs            Wheelchair Mobility    Modified Rankin (Stroke Patients Only) Modified Rankin (Stroke Patients Only) Pre-Morbid Rankin Score: No significant disability Modified Rankin: Severe disability     Balance Overall balance assessment: Needs assistance Sitting-balance support: No upper extremity supported;Single extremity supported Sitting balance-Leahy Scale: Fair       Standing balance-Leahy Scale: Poor Standing balance comment: stood at EOB for `31mn with 2 person assist working on uprights stance.  Needing assist to fully extend R knee.                             Pertinent Vitals/Pain Pain Assessment: Faces Faces Pain Scale: No hurt Pain Descriptors / Indicators: Discomfort Pain Intervention(s): Limited activity within patient's tolerance;Monitored during session    Home Living Family/patient expects to be discharged to:: Inpatient rehab Living Arrangements: Alone Available Help at Discharge: Family;Friend(s);Available PRN/intermittently Type of Home: Other(Comment)([place]         Home Equipment: Walker - 2 wheels      Prior Function Level of Independence: Independent with assistive device(s)(in a supervised setting)         Comments: per family, pt uses RW to ambulate  independently, does all his basic ADL;s,  the facility does meds and meals.     Hand Dominance   Dominant Hand: Right    Extremity/Trunk Assessment   Upper Extremity Assessment Upper Extremity Assessment: Defer to OT evaluation(some right UE assoc. reactions only)    Lower Extremity Assessment Lower Extremity Assessment: RLE deficits/detail;LLE deficits/detail RLE Deficits / Details:  moves spontaneously in synergy.  pt bore some weight in standing, but unable to fully extend his knee. RLE Coordination: decreased fine motor LLE Deficits / Details: WFL       Communication   Communication: Expressive difficulties  Cognition Arousal/Alertness: Awake/alert Behavior During Therapy: Restless Overall Cognitive Status: Impaired/Different from baseline(NT formally) Area of Impairment: Attention;Following commands;Safety/judgement;Awareness;Problem solving                   Current Attention Level: Focused;Sustained   Following Commands: Follows one step commands inconsistently;Follows one step commands with increased time Safety/Judgement: Decreased awareness of safety;Decreased awareness of deficits Awareness: Intellectual Problem Solving: Slow processing;Requires tactile cues;Difficulty sequencing;Requires verbal cues        General Comments General comments (skin integrity, edema, etc.): pt short on focus or attention.  Has a long delay before following a direction when he does.    Exercises     Assessment/Plan    PT Assessment Patient needs continued PT services  PT Problem List Decreased strength;Decreased activity tolerance;Decreased balance;Decreased mobility;Decreased coordination;Decreased knowledge of use of DME       PT Treatment Interventions DME instruction;Gait training;Functional mobility training;Therapeutic activities;Therapeutic exercise;Balance training;Neuromuscular re-education;Patient/family education    PT Goals (Current goals can be found in the Care Plan section)  Acute Rehab PT Goals PT Goal Formulation: Patient unable to participate in goal setting Time For Goal Achievement: 04/14/18 Potential to Achieve Goals: Good    Frequency Min 3X/week   Barriers to discharge        Co-evaluation               AM-PAC PT "6 Clicks" Mobility  Outcome Measure Help needed turning from your back to your side while in a flat bed  without using bedrails?: A Lot Help needed moving from lying on your back to sitting on the side of a flat bed without using bedrails?: A Lot Help needed moving to and from a bed to a chair (including a wheelchair)?: A Lot Help needed standing up from a chair using your arms (e.g., wheelchair or bedside chair)?: A Lot Help needed to walk in hospital room?: Total Help needed climbing 3-5 steps with a railing? : Total 6 Click Score: 10    End of Session   Activity Tolerance: Patient tolerated treatment well Patient left: in bed;with call bell/phone within reach;with bed alarm set;with family/visitor present Nurse Communication: Mobility status PT Visit Diagnosis: Other abnormalities of gait and mobility (R26.89);Hemiplegia and hemiparesis Hemiplegia - Right/Left: Right Hemiplegia - dominant/non-dominant: Dominant Hemiplegia - caused by: Cerebral infarction    Time: 9323-5573 PT Time Calculation (min) (ACUTE ONLY): 36 min   Charges:   PT Evaluation $PT Eval Moderate Complexity: 1 Mod PT Treatments $Therapeutic Activity: 8-22 mins        03/31/2018  Donnella Sham, PT Acute Rehabilitation Services [phone]  (pager) [phone]  (office)  Tessie Fass Tayvian Holycross 03/31/2018, 4:49 PM

## 2018-03-31 NOTE — Plan of Care (Signed)
  Problem: Education: Goal: Knowledge of General Education information will improve Description Including pain rating scale, medication(s)/side effects and non-pharmacologic comfort measures 03/31/2018 1013 by Faylene MillionWhiteside, Ger Ringenberg A, RN Outcome: Progressing 03/31/2018 1011 by Faylene MillionWhiteside, Laikynn Pollio A, RN Outcome: Progressing   Problem: Clinical Measurements: Goal: Will remain free from infection 03/31/2018 1013 by Faylene MillionWhiteside, Zury Fazzino A, RN Outcome: Progressing 03/31/2018 1011 by Faylene MillionWhiteside, Amand Lemoine A, RN Outcome: Progressing   Problem: Activity: Goal: Risk for activity intolerance will decrease 03/31/2018 1013 by Faylene MillionWhiteside, Jansel Vonstein A, RN Outcome: Progressing 03/31/2018 1011 by Faylene MillionWhiteside, Gerry Heaphy A, RN Outcome: Progressing   Problem: Nutrition: Goal: Adequate nutrition will be maintained Outcome: Progressing   Problem: Coping: Goal: Level of anxiety will decrease 03/31/2018 1013 by Faylene MillionWhiteside, Deionte Spivack A, RN Outcome: Progressing 03/31/2018 1011 by Faylene MillionWhiteside, Briahnna Harries A, RN Outcome: Progressing Note:  Patient displays no signs and symptoms of anxiety.

## 2018-03-31 NOTE — Evaluation (Signed)
Speech Language Pathology Evaluation Patient Details Name: Douglas [name] L Levengood MRN: [id number] DOB: 1950-12-07 Today's Date: 03/31/2018 Time: [id number] SLP Time Calculation (min) (ACUTE ONLY): 37 min  Problem List:  Patient Active Problem List   Diagnosis Date Noted  . Stroke (cerebrum) (HCC) 03/30/2018   Past Medical History: History reviewed. No pertinent past medical history. Past Surgical History:  Past Surgical History:  Procedure Laterality Date  . IR PATIENT EVAL TECH 0-60 MINS  03/30/2018   HPI:  67 y.o. male with a history of previous stroke, hypertension, diabetes, hyperlipidemia who presents with right-sided weakness and aphasia that started abruptly at 9 AM.  He was in his normal state of health and came downstairs and when his eating breakfast when he suddenly stopped responding to his daughters MRI brain 03/31/18 indicated Evolving large acute left MCA infarct with new cytotoxic edema and minimal petechial hemorrhage. No midline shift. 2. Large chronic left PCA infarct. 3. Severe chronic small vessel ischemic disease. Coexistent multiple sclerosis is not excluded by imaging  Assessment / Plan / Recommendation Clinical Impression   Pt presents with global aphasia comprised of inconsistent completion of 1-step directions paired with decreased overall processing of information (pt could follow simple directives with a delay; I.e.: "raise your left hand" if given adequate time to process and complete command); inconsistent yes/no questions re: basic information; pt repeatedly stated "yea" when asked various questions, was unable to repeat words, but would oftentimes laugh if the answer was "no" when asked certain questions vs. Stating "yea"; pt was unable to repeat name, but attempted to say when SLP asked and then located his armband; difficult to assess cognition as pt had difficulty attending to simple tasks during the evaluation.  Graphic expression and reading were not assessed  d/t global aphasia present; pt has a hx of previous CVA per his daughters and residual effects were mild dysarthria and decreased processing of information.  ST will f/u while in acute setting for speech/cognitive deficits and dysphagia; MBS pending; plan is TBD [place]    SLP Assessment  SLP Recommendation/Assessment: Patient needs continued Speech Language Pathology Services SLP Visit Diagnosis: Dysphagia, oropharyngeal phase (R13.12);Aphasia (R47.01);Cognitive communication deficit (R41.841)    Follow Up Recommendations  Other (comment)(TBD )    Frequency and Duration min 2x/week  2 weeks      SLP Evaluation Cognition  Overall Cognitive Status: Impaired/Different from baseline Arousal/Alertness: Awake/alert Orientation Level: Oriented to person;Other (comment)(DTA d/t global aphasia) Attention: Sustained Sustained Attention: Impaired Sustained Attention Impairment: Verbal basic;Functional basic Memory: (DTA) Awareness: (DTA) Behaviors: Impulsive Safety/Judgment: (DTA)       Comprehension  Auditory Comprehension Overall Auditory Comprehension: Impaired Yes/No Questions: Impaired Other Yes/No Questions Comments`: Moderate Commands: Impaired One Step Basic Commands: 50-74% accurate Conversation: Other (comment)(Pt only stated "yea" when asked questions; unable to imitate) Interfering Components: Processing speed;Motor planning;Attention EffectiveTechniques: Extra processing time;Repetition;Pausing;Visual/Gestural cues Visual Recognition/Discrimination Discrimination: Not tested Reading Comprehension Reading Status: Not tested    Expression Expression Primary Mode of Expression: Nonverbal - gestures Verbal Expression Overall Verbal Expression: Impaired Initiation: Impaired Level of Generative/Spontaneous Verbalization: Word Repetition: Impaired Level of Impairment: Word level Naming: Impairment Responsive: 0-25% accurate Confrontation:  Impaired Convergent: 0-24% accurate Divergent: 0-24% accurate Verbal Errors: Not aware of errors;Perseveration Pragmatics: Unable to assess Non-Verbal Means of Communication: Gestures Written Expression Dominant Hand: Right Written Expression: Not tested   Oral / Motor  Oral Motor/Sensory Function Overall Oral Motor/Sensory Function: Moderate impairment Facial ROM: Reduced right Facial Symmetry: Abnormal symmetry  right Facial Strength: Reduced right Facial Sensation: Reduced right Lingual ROM: Other (Comment)(DTA) Lingual Symmetry: Other (Comment)(DTA) Lingual Strength: Reduced Lingual Sensation: Reduced Mandible: Impaired Motor Speech Overall Motor Speech: Impaired Respiration: Within functional limits Phonation: Other (comment)(DTA d/t limited verbalizations) Resonance: (Grossly WFL) Articulation: Impaired Level of Impairment: Word Intelligibility: Unable to assess (comment) Motor Planning: Impaired Level of Impairment: Word Motor Speech Errors: Unaware                       Tressie Stalker, M.S., CCC-SLP 03/31/2018, 2:44 PM

## 2018-03-31 NOTE — Progress Notes (Signed)
3% started at 1400. Patient more consistent with following commands. Patient still repeating "yes" when asked simple questions.

## 2018-03-31 NOTE — Plan of Care (Signed)
  Problem: Education: Goal: Knowledge of General Education information will improve Description Including pain rating scale, medication(s)/side effects and non-pharmacologic comfort measures Outcome: Progressing   Problem: Clinical Measurements: Goal: Will remain free from infection Outcome: Progressing   Problem: Activity: Goal: Risk for activity intolerance will decrease Outcome: Progressing   Problem: Coping: Goal: Level of anxiety will decrease Outcome: Progressing Note:  Patient displays no signs and symptoms of anxiety.

## 2018-03-31 NOTE — Evaluation (Signed)
Clinical/Bedside Swallow Evaluation Patient Details  Name: Douglas [name] MRN: [id number] Date of Birth: 1950-09-07  Today's Date: 03/31/2018 Time: SLP Start Time (ACUTE ONLY): 1312 SLP Stop Time (ACUTE ONLY): 1349 SLP Time Calculation (min) (ACUTE ONLY): 37 min  Past Medical History: History reviewed. No pertinent past medical history. Past Surgical History:  Past Surgical History:  Procedure Laterality Date  . IR PATIENT EVAL TECH 0-60 MINS  03/30/2018   HPI:  67 y.o. male with a history of previous stroke, hypertension, diabetes, hyperlipidemia who presents with right-sided weakness and aphasia that started abruptly at 9 AM.  He was in his normal state of health and came downstairs and when his eating breakfast when he suddenly stopped responding to his daughters  MRI brain 03/31/18 indicated Evolving large acute left MCA infarct with new cytotoxic edema and minimal petechial hemorrhage. No midline shift. 2. Large chronic left PCA infarct. 3. Severe chronic small vessel ischemic disease. Coexistent multiple sclerosis is not excluded by imaging Assessment / Plan / Recommendation Clinical Impression   Pt with overt s/s of aspiration noted including immediate/delayed cough, delay in the initiation of the swallow, right sensory/motor impairment, oral holding, decreased oral manipulation/propulsion, pooling of saliva prior to and post PO intake paired with cognitive deficits placing pt at moderate risk for aspiration during PO intake; recommend NPO until objective assessment is completed to determine aspiration risk and safest diet consistency at this time. SLP Visit Diagnosis: Dysphagia, oropharyngeal phase (R13.12)    Aspiration Risk  Moderate aspiration risk    Diet Recommendation   NPO until objective assessment is completed  Medication Administration: Via alternative means    Other  Recommendations Oral Care Recommendations: Oral care QID;Oral care prior to ice chip/H20   Follow  up Recommendations Other (comment)(TBD)      Frequency and Duration min 2x/week  2 weeks       Prognosis Prognosis for Safe Diet Advancement: Fair Barriers to Reach Goals: Cognitive deficits;Severity of deficits      Swallow Study   General Date of Onset: 03/30/18 HPI: 67 y.o. male with a history of previous stroke, hypertension, diabetes, hyperlipidemia who presents with right-sided weakness and aphasia that started abruptly at 9 AM.  He was in his normal state of health and came downstairs and when his eating breakfast when he suddenly stopped responding to his daughters Type of Study: Bedside Swallow Evaluation Previous Swallow Assessment: (Failed [facility] 03/30/18) Diet Prior to this Study: NPO Temperature Spikes Noted: Yes Respiratory Status: Room air History of Recent Intubation: No Behavior/Cognition: Alert;Cooperative;Distractible;Requires cueing Oral Cavity Assessment: Excessive secretions Oral Care Completed by SLP: Yes Oral Cavity - Dentition: Adequate natural dentition;Missing dentition Vision: Functional for self-feeding Self-Feeding Abilities: Able to feed self;Needs assist Patient Positioning: Upright in bed Baseline Vocal Quality: Low vocal intensity Volitional Cough: Cognitively unable to elicit Volitional Swallow: Unable to elicit    Oral/Motor/Sensory Function Overall Oral Motor/Sensory Function: Moderate impairment Facial ROM: Reduced right Facial Symmetry: Abnormal symmetry right Facial Strength: Reduced right Facial Sensation: Reduced right Lingual ROM: Other (Comment)(DTA) Lingual Symmetry: Other (Comment)(DTA) Lingual Strength: Reduced Lingual Sensation: Reduced Mandible: Impaired   Ice Chips Ice chips: Impaired Presentation: Spoon Oral Phase Impairments: Reduced lingual movement/coordination Oral Phase Functional Implications: Right lateral sulci pocketing;Prolonged oral transit Pharyngeal Phase Impairments: Suspected delayed Swallow;Cough -  Delayed   Thin Liquid Thin Liquid: Impaired Presentation: Cup;Spoon Oral Phase Impairments: Reduced lingual movement/coordination Oral Phase Functional Implications: Prolonged oral transit;Oral holding;Right lateral sulci pocketing Pharyngeal  Phase Impairments: Suspected delayed  Swallow;Cough - Immediate    Nectar Thick Nectar Thick Liquid: Impaired Presentation: Cup Oral Phase Impairments: Reduced lingual movement/coordination Oral phase functional implications: Prolonged oral transit Pharyngeal Phase Impairments: Suspected delayed Swallow;Cough - Delayed   Honey Thick Honey Thick Liquid: Not tested   Puree Puree: Impaired Presentation: Self Fed Oral Phase Impairments: Reduced lingual movement/coordination Oral Phase Functional Implications: Prolonged oral transit;Oral holding Pharyngeal Phase Impairments: Suspected delayed Swallow;Cough - Delayed   Solid     Solid: Not tested      Douglas StalkerPat [name], M.S., CCC-SLP 03/31/2018,2:31 PM

## 2018-04-01 ENCOUNTER — Inpatient Hospital Stay (HOSPITAL_COMMUNITY): Payer: Medicare Other

## 2018-04-01 DIAGNOSIS — I6300 Cerebral infarction due to thrombosis of unspecified precerebral artery: Secondary | ICD-10-CM

## 2018-04-01 LAB — BASIC METABOLIC PANEL
Anion gap: 8 (ref 5–15)
BUN: 8 mg/dL (ref 8–23)
CO2: 23 mmol/L (ref 22–32)
Calcium: 9 mg/dL (ref 8.9–10.3)
Chloride: 113 mmol/L — ABNORMAL HIGH (ref 98–111)
Creatinine, Ser: 1.29 mg/dL — ABNORMAL HIGH (ref 0.61–1.24)
GFR calc Af Amer: >60 mL/min (ref >60)
GFR calc non Af Amer: 57 mL/min — ABNORMAL LOW (ref >60)
Glucose, Bld: 149 mg/dL — ABNORMAL HIGH (ref 70–99)
Potassium: 3.7 mmol/L (ref 3.5–5.1)
Sodium: 144 mmol/L (ref 135–145)

## 2018-04-01 LAB — GLUCOSE, CAPILLARY
Glucose-Capillary: 135 mg/dL — ABNORMAL HIGH (ref 70–99)
Glucose-Capillary: 194 mg/dL — ABNORMAL HIGH (ref 70–99)
Glucose-Capillary: 219 mg/dL — ABNORMAL HIGH (ref 70–99)
Glucose-Capillary: 167 mg/dL — ABNORMAL HIGH (ref 70–99)

## 2018-04-01 LAB — SODIUM
Sodium: 146 mmol/L — ABNORMAL HIGH (ref 135–145)
Sodium: 145 mmol/L (ref 135–145)
Sodium: 138 mmol/L (ref 135–145)

## 2018-04-01 MED ORDER — LABETALOL HCL 5 MG/ML IV SOLN: 20.0000 mg | INTRAVENOUS | Status: DC | PRN

## 2018-04-01 MED ORDER — GLIPIZIDE 5 MG PO TABS
5.0000 mg | ORAL_TABLET | Freq: Two times a day (BID) | ORAL | Status: DC
  Administered 2018-04-01 – 2018-04-04 (×5): 5 mg via ORAL
  Filled 2018-04-01 (×6): qty 1

## 2018-04-01 MED ORDER — CLONIDINE HCL 0.1 MG PO TABS
0.2000 mg | ORAL_TABLET | Freq: Two times a day (BID) | ORAL | Status: DC
  Administered 2018-04-01 – 2018-04-04 (×7): 0.2 mg via ORAL
  Filled 2018-04-01 (×7): qty 2

## 2018-04-01 MED ORDER — ASPIRIN EC 81 MG PO TBEC
81.0000 mg | DELAYED_RELEASE_TABLET | Freq: Every day | ORAL | Status: DC
  Administered 2018-04-01 – 2018-04-02 (×2): 81 mg via ORAL
  Filled 2018-04-01 (×2): qty 1

## 2018-04-01 MED ORDER — ATORVASTATIN CALCIUM 10 MG PO TABS
20.0000 mg | ORAL_TABLET | Freq: Every day | ORAL | Status: DC
  Administered 2018-04-01 – 2018-04-03 (×3): 20 mg via ORAL
  Filled 2018-04-01 (×3): qty 2

## 2018-04-01 MED ORDER — METOPROLOL SUCCINATE ER 25 MG PO TB24
25.0000 mg | ORAL_TABLET | Freq: Every day | ORAL | Status: DC
  Administered 2018-04-01 – 2018-04-02 (×2): 25 mg via ORAL
  Filled 2018-04-01 (×2): qty 1

## 2018-04-01 MED ORDER — RESOURCE THICKENUP CLEAR PO POWD
ORAL | Status: DC | PRN
  Filled 2018-04-01: qty 125

## 2018-04-01 MED ORDER — AMLODIPINE BESYLATE 10 MG PO TABS
10.0000 mg | ORAL_TABLET | Freq: Every day | ORAL | Status: DC
  Administered 2018-04-01 – 2018-04-04 (×4): 10 mg via ORAL
  Filled 2018-04-01 (×4): qty 1

## 2018-04-01 NOTE — Progress Notes (Signed)
STROKE TEAM PROGRESS NOTE   HISTORY OF PRESENT ILLNESS (per record) Douglas [name] is a 67 y.o. male with a history of previous stroke, hypertension, diabetes, hyperlipidemia who presents with right-sided weakness and aphasia that started abruptly at 9 AM. 0n 03/30/18  He was in his normal state of health and came downstairs and when his eating breakfast when he suddenly stopped responding to his daughters.  He is here visiting from [place] for Thanksgiving.    LKW: 9 AM tpa given?:  Yes Modified Rankin Scale: 3-Moderate disability-requires help but walks WITHOUT assistance   SUBJECTIVE (INTERVAL HISTORY) Pt is aphasic. Dr Pearlean Brownie spoke at length with his 2 daughters.he remains neurologically stable. Blood pressure is well controlled. He is scheduled to have follow-up MRI this morning   OBJECTIVE Vitals:   04/01/18 1115 04/01/18 1130 04/01/18 1145 04/01/18 1200  BP: (!) 167/74 (!) 154/70 (!) 151/74 (!) 152/70  Pulse: (!) 103 (!) 101 97 99  Resp: 17 13 11 15   Temp:      TempSrc:      SpO2: 100% 100% 100% 99%  Weight:      Height:        CBC:  Recent Labs  Lab 03/30/18 1026 03/30/18 1036 03/31/18 0223  WBC 5.9  --  9.3  NEUTROABS 3.2  --   --   HGB 13.9 14.6 13.2  HCT 45.0 43.0 40.4  MCV 91.1  --  88.2  PLT 224  --  214    Basic Metabolic Panel:  Recent Labs  Lab 03/30/18 1026 03/30/18 1036  04/01/18 0407 04/01/18 1034  NA 137 139   < > 144 146*  K 3.7 3.6  --  3.7  --   CL 105 104  --  113*  --   CO2 23  --   --  23  --   GLUCOSE 243* 247*  --  149*  --   BUN 23 24*  --  8  --   CREATININE 1.80* 1.80*  --  1.29*  --   CALCIUM 9.4  --   --  9.0  --    < > = values in this interval not displayed.    Lipid Panel:     Component Value Date/Time   CHOL 109 03/31/2018 0223   TRIG 64 03/31/2018 0223   HDL 36 (L) 03/31/2018 0223   CHOLHDL 3.0 03/31/2018 0223   VLDL 13 03/31/2018 0223   LDLCALC 60 03/31/2018 0223   HgbA1c:  Lab Results  Component Value  Date   HGBA1C 7.3 (H) 03/31/2018   Urine Drug Screen:     Component Value Date/Time   LABOPIA NONE DETECTED 03/31/2018 0927   COCAINSCRNUR NONE DETECTED 03/31/2018 0927   LABBENZ NONE DETECTED 03/31/2018 0927   AMPHETMU NONE DETECTED 03/31/2018 0927   THCU NONE DETECTED 03/31/2018 0927   LABBARB NONE DETECTED 03/31/2018 0927    Alcohol Level No results found for: ETH  IMAGING  Ct Angio Head W Or Wo Contrast Ct Angio Neck W Or Wo Contrast Ct Cerebral Perfusion W Contrast 03/30/2018  IMPRESSION:  1. Large left MCA territory infarct with extremely poor collaterals.  2. Emergent large vessel occlusion of the proximal left M1 segment.  3. Moderate to high-grade segmental stenoses of the right M1 segment.  4. Moderate proximal A1 stenoses bilaterally.  5. High-grade stenosis and occlusion of the right vertebral artery at the dural margin and V4 segment.  6. High-grade stenosis of the left vertebral artery  with small irregular basilar artery feeding the residual right PCA.  7. High-grade stenosis of distal right P2 segment.  8. 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     Mr Brain Wo Contrast 03/31/2018 IMPRESSION:  1. Evolving large acute left MCA infarct with new cytotoxic edema and minimal petechial hemorrhage. No midline shift.  2. Large chronic left PCA infarct.  3. Severe chronic small vessel ischemic disease.  Coexistent multiple sclerosis is not excluded by imaging.    Mr Brain Wo Contrast 03/30/2018 IMPRESSION:  1. Acute nonhemorrhagic large left MCA territory infarct.  2. Remote left PCA territory infarct.  3. Advanced white matter disease consistent with chronic microvascular ischemia.  4. Vascular changes as above.    Ir Patient Eval Tech 0-60 Mins 03/30/2018 Gaspar SkeetersSandling, Cory A  03/30/2018 11:19 AM IR code stroke team activated, opened supplies, and getting procedure prepared to start and code stroke  for IR team cancelled.   Ct Head Code Stroke Wo Contrast 03/30/2018 IMPRESSION:  1. Remote left PCA territory nonhemorrhagic infarct  2. Moderate diffuse white matter disease bilaterally.  3. No acute cortical abnormality.  4. ASPECTS is 10/10     Transthoracic Echocardiogram  03/30/2018 Study Conclusions - Left ventricle: The cavity size was normal. Wall thickness was   increased in a pattern of moderate LVH. Systolic function was   normal. The estimated ejection fraction was in the range of 60%   to 65%. Wall motion was normal; there were no regional wall   motion abnormalities. Doppler parameters are consistent with   abnormal left ventricular relaxation (grade 1 diastolic   dysfunction). The Ee&' ratio is between 8-15, suggesting   indeterminate LV filling pressure. - Left atrium: The atrium was normal in size. - Atrial septum: No defect or patent foramen ovale was identified. - Inferior vena cava: The vessel was normal in size. The   respirophasic diameter changes were in the normal range (>= 50%),   consistent with normal central venous pressure. Impressions: - LVEF 60-65%, moderate LVH, normal wall motion, grade 1 DD,   indeterminate LV filling pressure, normal LA size, normal IVC.     PHYSICAL EXAM Blood pressure (!) 152/70, pulse 99, temperature 99.9 F (37.7 C), temperature source Oral, resp. rate 15, height 6\' 2"  (1.88 m), weight 86.7 kg, SpO2 99 %.  Pleasant middle-aged African-American male currently not in distress. . Afebrile. Head is nontraumatic. Neck is supple without bruit.    Cardiac exam no murmur or gallop. Lungs are clear to auscultation. Distal pulses are well felt. Neurological Exam :  Awake alert globally aphasic. Speaks only occasional words but not sentences. Will follow only simple midline and occasional one-step commands. Blinks to threat on the left but not the right. Left gaze preference but able to look to the right past midline. Right lower  facial weakness. Dense right hemiplegia with only trace withdrawal in the right lower extremity. Antigravity strength on the left side with intermittent action tremor of the left upper extremity. Tone is diminished on the right and deep tendon reflexes are depressed on the right. Right plantar upgoing left downgoing. Gait not tested.     ASSESSMENT/PLAN [name] is a 67 y.o. male with history of previous stroke, hypertension, diabetes, hyperlipidemia who presents with right-sided weakness and aphasia.  IV tPA 03/30/2018 at 11:59 AM  Stroke:  Large left MCA territory infarct - possibly embolic - unknown source  Resultant  Global  aphasia and right hemiplegia  CT head - Remote left PCA territory nonhemorrhagic infarct   MRI head - Acute nonhemorrhagic large left MCA territory infarct.   MRA head - not performed  CTA H&N - diffuse cerebrovascular disease as described above.  Carotid Doppler - CTA neck performed - carotid dopplers not indicated.  2D Echo - EF 60 - 65%. No cardiac source of emboli identified.   LDL - 60  HgbA1c - 7.3  UDS - negative  VTE prophylaxis - SCDs  Diet - NPO  clopidogrel 75 mg daily prior to admission, now on No antithrombotic S/P tPA  Ongoing aggressive stroke risk factor management  Therapy recommendations:  pending  Disposition:  Pending  Hypertension  Stable (Cardene) . SBP goal < 180 mmHg initially after tPA . Long-term BP goal normotensive  Hyperlipidemia  Lipid lowering medication PTA:  Lipitor 20 mg daily  LDL 60, goal < 70  Current lipid lowering medication: NPO  Continue statin at discharge   Other Stroke Risk Factors  Advanced age  Hx stroke/TIA   Other Active Problems  Creatinine -1.80 - monitor  Hyperglycemia - monitor - monitor - may need to increase insulin  Hypertonic saline 3% at 75 cc/hr - peripheral IV  Plan  Pt passed swallowing study. Gradually resume home meds.   Now on hypertonic  saline at 75 cc / hr. Will need central line if plan is to continue.  TEE Monday - message sent to Trish (hold glucatrol in AM)  Pt on Plavix PTA - discuss antiplatelet therapy - will start with ASA 81 mg.  Greater than 24 hours post TPA. Allow permissive hypertension.   Hospital day # 2  Delton See [facility] Pager [phone] 04/01/2018, 3:07 PM    Transfer to floor bed. Mobilize out of bed. Pt/OT/Rehab consults.Taper hypertonic saline over next 24 hrs and DC. No family available at bedside. This patient is critically ill and at significant risk of neurological worsening, death and care requires constant monitoring of vital signs, hemodynamics,respiratory and cardiac monitoring, extensive review of multiple databases, frequent neurological assessment, discussion with family, other specialists and medical decision making of high complexity.I have made any additions or clarifications directly to the above note.This critical care time does not reflect procedure time, or teaching time or supervisory time of PA/NP/Med Resident etc but could involve care discussion time.  I spent 30 minutes of neurocritical care time  in the care of  this patient.    Delia Heady, MD        To contact Stroke Continuity provider, please refer to [email or web address]. After hours, contact General Neurology

## 2018-04-01 NOTE — Progress Notes (Addendum)
Modified Barium Swallow Progress Note  Patient Details  Name: Douglas [name] MRN: [id number] Date of Birth: 11-May-1950  Today's Date: 04/01/2018  Modified Barium Swallow completed.  Full report located under Chart Review in the Imaging Section.  Brief recommendations include the following:  Clinical Impression  Pt demosntrates a primary oral dysphagia with sensory/motor impairment on the right leading to decreased labial seal and mild pooling in right buccal cavity. Extra time is needed for bolus formation, but pt uses a poterior head tilt instinctively to facilitate transit. Even with large impulsive sips, though liquids pooled in the pyriforms prior to swallow, only trace silent aspiration occured with thin liquids and not at all with nectar over various trials. Pt could not follow instruction to cough/clear aspirate. Given pts ability to masticate and orally manipulate soldis with extra time, recomemnd intiating a dys 2 (fine chopped) diet with nectar thick liquids with supervision. Exra care needed to clear right buccal cavity after intake. SLP will f/u for tolerance.    Swallow Evaluation Recommendations       SLP Diet Recommendations: Dysphagia 2 (Fine chop) solids;Nectar thick liquid   Liquid Administration via: Cup;Straw   Medication Administration: Whole meds with liquid   Supervision: Staff to assist with self feeding;Full supervision/cueing for compensatory strategies   Compensations: Slow rate;Small sips/bites;Minimize environmental distractions;Monitor for anterior loss;Lingual sweep for clearance of pocketing   Postural Changes: Seated upright at 90 degrees   Oral Care Recommendations: Oral care before and after PO   Other Recommendations: Order thickener from pharmacy;Have oral suction available   Harlon DittyBonnie Jaques Mineer, MA CCC-SLP  Acute Rehabilitation Services Pager [phone] Office [phone]  Claudine MoutonDeBlois, Marlette Curvin Caroline 04/01/2018,10:19 AM

## 2018-04-01 NOTE — Progress Notes (Signed)
Patient passed barium swallow test. Stroke PA paged requesting home medications be resumed.

## 2018-04-01 NOTE — Progress Notes (Signed)
3% infusion decreased by 50% per verbal by Dr. Pearlean BrownieSethi. Neurology request we decrease by 50% one more time at 2030 tonight before discontinuing infusion completely. Neurology states he does not want a central line or PICC due to the aforementioned plan.   Neurology wants to keep current BP parameters (SBP >180/ DBP >105). Will treat with labetalol PRN for heart rate and attempt to wean Cardene gtt.

## 2018-04-01 NOTE — Progress Notes (Signed)
Rehab Admissions Coordinator Note:  Patient was screened by Clois DupesBoyette, Runell Kovich Godwin for appropriateness for an Inpatient Acute Rehab Consult per PT recommendation.  At this time, we are recommending Inpatient Rehab consult.  Clois DupesBoyette, Cortlandt Capuano Godwin 04/01/2018, 9:54 AM  I can be reached at [phone].

## 2018-04-01 NOTE — Plan of Care (Signed)
  Problem: Education: Goal: Knowledge of General Education information will improve Description Including pain rating scale, medication(s)/side effects and non-pharmacologic comfort measures Outcome: Progressing   Problem: Health Behavior/Discharge Planning: Goal: Ability to manage health-related needs will improve Outcome: Progressing   Problem: Clinical Measurements: Goal: Diagnostic test results will improve Outcome: Progressing Goal: Respiratory complications will improve Outcome: Completed/Met   Problem: Activity: Goal: Risk for activity intolerance will decrease Outcome: Progressing Note:  Patient able to follow commands more regularly today.    Problem: Nutrition: Goal: Adequate nutrition will be maintained Outcome: Progressing Note:  Patient passed barium swallow test and placed on diet.    Problem: Safety: Goal: Ability to remain free from injury will improve Outcome: Progressing   Problem: Skin Integrity: Goal: Risk for impaired skin integrity will decrease Outcome: Progressing   Problem: Ischemic Stroke/TIA Tissue Perfusion: Goal: Complications of ischemic stroke/TIA will be minimized Outcome: Progressing Note:  Patient showing an improvement, measured by decreased NIH score today.

## 2018-04-02 ENCOUNTER — Inpatient Hospital Stay (HOSPITAL_COMMUNITY): Payer: Medicare Other

## 2018-04-02 ENCOUNTER — Encounter (HOSPITAL_COMMUNITY): Payer: Self-pay

## 2018-04-02 ENCOUNTER — Inpatient Hospital Stay (HOSPITAL_COMMUNITY): Payer: Medicare Other | Admitting: Certified Registered"

## 2018-04-02 ENCOUNTER — Encounter (HOSPITAL_COMMUNITY)
Admission: EM | Disposition: A | Discharge status: Rehabilitation Facility | Payer: Self-pay | Source: Home / Self Care | Attending: Neurology

## 2018-04-02 DIAGNOSIS — I63412 Cerebral infarction due to embolism of left middle cerebral artery: Principal | ICD-10-CM

## 2018-04-02 DIAGNOSIS — E785 Hyperlipidemia, unspecified: Secondary | ICD-10-CM

## 2018-04-02 DIAGNOSIS — I6389 Other cerebral infarction: Secondary | ICD-10-CM

## 2018-04-02 DIAGNOSIS — I69391 Dysphagia following cerebral infarction: Secondary | ICD-10-CM

## 2018-04-02 DIAGNOSIS — I10 Essential (primary) hypertension: Secondary | ICD-10-CM

## 2018-04-02 DIAGNOSIS — E1159 Type 2 diabetes mellitus with other circulatory complications: Secondary | ICD-10-CM | POA: Diagnosis present

## 2018-04-02 DIAGNOSIS — I639 Cerebral infarction, unspecified: Secondary | ICD-10-CM

## 2018-04-02 DIAGNOSIS — E139 Other specified diabetes mellitus without complications: Secondary | ICD-10-CM | POA: Diagnosis present

## 2018-04-02 DIAGNOSIS — Z8673 Personal history of transient ischemic attack (TIA), and cerebral infarction without residual deficits: Secondary | ICD-10-CM

## 2018-04-02 DIAGNOSIS — I6930 Unspecified sequelae of cerebral infarction: Secondary | ICD-10-CM

## 2018-04-02 DIAGNOSIS — I152 Hypertension secondary to endocrine disorders: Secondary | ICD-10-CM | POA: Diagnosis present

## 2018-04-02 DIAGNOSIS — E1169 Type 2 diabetes mellitus with other specified complication: Secondary | ICD-10-CM | POA: Diagnosis present

## 2018-04-02 HISTORY — PX: TEE WITHOUT CARDIOVERSION: SHX5443

## 2018-04-02 LAB — CBC
HCT: 44.1 % (ref 39.0–52.0)
Hemoglobin: 14.1 g/dL (ref 13.0–17.0)
MCH: 28.8 pg (ref 26.0–34.0)
MCHC: 32 g/dL (ref 30.0–36.0)
MCV: 90.2 fL (ref 80.0–100.0)
Platelets: 202 10*3/uL (ref 150–400)
RBC: 4.89 MIL/uL (ref 4.22–5.81)
RDW: 12.9 % (ref 11.5–15.5)
WBC: 9.6 10*3/uL (ref 4.0–10.5)
nRBC: 0 % (ref 0.0–0.2)

## 2018-04-02 LAB — GLUCOSE, CAPILLARY
Glucose-Capillary: 182 mg/dL — ABNORMAL HIGH (ref 70–99)
Glucose-Capillary: 185 mg/dL — ABNORMAL HIGH (ref 70–99)
Glucose-Capillary: 187 mg/dL — ABNORMAL HIGH (ref 70–99)

## 2018-04-02 LAB — BASIC METABOLIC PANEL
Anion gap: 9 (ref 5–15)
BUN: 10 mg/dL (ref 8–23)
CO2: 28 mmol/L (ref 22–32)
Calcium: 9.7 mg/dL (ref 8.9–10.3)
Chloride: 105 mmol/L (ref 98–111)
Creatinine, Ser: 1.22 mg/dL (ref 0.61–1.24)
GFR calc Af Amer: >60 mL/min (ref >60)
GFR calc non Af Amer: >60 mL/min (ref >60)
Glucose, Bld: 203 mg/dL — ABNORMAL HIGH (ref 70–99)
Potassium: 3.9 mmol/L (ref 3.5–5.1)
Sodium: 142 mmol/L (ref 135–145)

## 2018-04-02 LAB — SODIUM: Sodium: 141 mmol/L (ref 135–145)

## 2018-04-02 SURGERY — ECHOCARDIOGRAM, TRANSESOPHAGEAL
Anesthesia: Monitor Anesthesia Care

## 2018-04-02 MED ORDER — PROPOFOL 10 MG/ML IV BOLUS
INTRAVENOUS | Status: DC | PRN
  Administered 2018-04-02: 50 mg via INTRAVENOUS
  Administered 2018-04-02: 10 mg via INTRAVENOUS

## 2018-04-02 MED ORDER — SODIUM CHLORIDE 0.9 % IV SOLN
INTRAVENOUS | Status: DC
  Administered 2018-04-02 – 2018-04-04 (×5): via INTRAVENOUS

## 2018-04-02 MED ORDER — BUTAMBEN-TETRACAINE-BENZOCAINE 2-2-14 % EX AERO
INHALATION_SPRAY | CUTANEOUS | Status: DC | PRN
  Administered 2018-04-02: 2 via TOPICAL

## 2018-04-02 MED ORDER — LIDOCAINE 2% (20 MG/ML) 5 ML SYRINGE
INTRAMUSCULAR | Status: DC | PRN
  Administered 2018-04-02: 60 mg via INTRAVENOUS

## 2018-04-02 MED ORDER — SODIUM CHLORIDE 0.9 % IV SOLN: INTRAVENOUS | Status: DC

## 2018-04-02 MED ORDER — PANTOPRAZOLE SODIUM 40 MG PO TBEC
40.0000 mg | DELAYED_RELEASE_TABLET | Freq: Every day | ORAL | Status: DC
  Administered 2018-04-02 – 2018-04-04 (×3): 40 mg via ORAL
  Filled 2018-04-02 (×3): qty 1

## 2018-04-02 MED ORDER — ASPIRIN EC 325 MG PO TBEC
325.0000 mg | DELAYED_RELEASE_TABLET | Freq: Every day | ORAL | Status: DC
  Administered 2018-04-03 – 2018-04-04 (×2): 325 mg via ORAL
  Filled 2018-04-02 (×2): qty 1

## 2018-04-02 MED ORDER — PROPOFOL 500 MG/50ML IV EMUL
INTRAVENOUS | Status: DC | PRN
  Administered 2018-04-02: 70 ug/kg/min via INTRAVENOUS

## 2018-04-02 MED ORDER — LACTATED RINGERS IV SOLN: INTRAVENOUS | Status: DC | PRN

## 2018-04-02 MED ORDER — GLYCOPYRROLATE 0.2 MG/ML IJ SOLN
INTRAMUSCULAR | Status: DC | PRN
  Administered 2018-04-02 (×2): 0.1 mg via INTRAVENOUS

## 2018-04-02 MED ORDER — CLOPIDOGREL BISULFATE 75 MG PO TABS
75.0000 mg | ORAL_TABLET | Freq: Every day | ORAL | Status: DC
  Administered 2018-04-02 – 2018-04-04 (×3): 75 mg via ORAL
  Filled 2018-04-02 (×3): qty 1

## 2018-04-02 MED ORDER — ENOXAPARIN SODIUM 40 MG/0.4ML ~~LOC~~ SOLN
40.0000 mg | SUBCUTANEOUS | Status: DC
  Administered 2018-04-02 – 2018-04-04 (×3): 40 mg via SUBCUTANEOUS
  Filled 2018-04-02 (×3): qty 0.4

## 2018-04-02 MED ORDER — PROPOFOL 500 MG/50ML IV EMUL
INTRAVENOUS | Status: AC
  Filled 2018-04-02: qty 100

## 2018-04-02 MED ORDER — PROPOFOL 1000 MG/100ML IV EMUL
INTRAVENOUS | Status: AC
  Filled 2018-04-02: qty 100

## 2018-04-02 MED ORDER — PHENYLEPHRINE HCL 10 MG/ML IJ SOLN
INTRAMUSCULAR | Status: DC | PRN
  Administered 2018-04-02 (×2): 80 ug via INTRAVENOUS

## 2018-04-02 NOTE — CV Procedure (Signed)
Brief TEE Note  LVEF 55-60% Thrombus noted in the left atrial appendage No PFO by color flow Doppler or saline microcavitation study. Trivial MR and AR. Mild PR  For additional details see full report.   Johathon Overturf C. Duke Salviaandolph, MD, [facility]FACC 04/02/2018 12:47 PM

## 2018-04-02 NOTE — Consult Note (Signed)
Physical Medicine and Rehabilitation Consult Reason for Consult: Decreased functional mobility Referring Physician:  Dr. Pearlean Brownie   HPI: Douglas [name] is a 67 y.o.right hand male with history of hypertension, diabetes mellitus, hyperlipidemia and previous CVA maintained on Plavix. Per chart review and daughter, patient lives alone independent with assistive device prior to admission. Patient lives in [place]/ALF and was visiting family in the area. Presented 03/30/2018 with increased speech difficulty and limited mobility. Cranial CT showing remote left PCA territory nonhemorrhagic infarction. CT angiogram of head and neck showed large left MCA territory infarct with extremely poor collaterals. Emergent large vessel occlusion proximal left M1 segment. Echocardiogram with ejection fraction 65% without emboli. Follow-up MRI reviewed, showing large MCA infarct.  Dysphagia #2 nectar thick liquids. Plan for TEE today. Currently on aspirin for CVA prophylaxis. Therapy evaluation completed with recommendations of physical medicine rehabilitation consult.  Review of Systems  Unable to perform ROS: Language   Per chart: Hypertension, diabetes mellitus, hyperlipidemia, previous CVA,  Unable to obtain from patient Past Surgical History:  Procedure Laterality Date  . IR PATIENT EVAL TECH 0-60 MINS  03/30/2018   History reviewed. No pertinent family history.,  Unable to obtain from patient Social History:  has no tobacco, alcohol, and drug history on file.,  Unable to obtain from patient Allergies: No Known Allergies Medications Prior to Admission  Medication Sig Dispense Refill  . amLODipine (NORVASC) 10 MG tablet Take 10 mg by mouth daily.  3  . atorvastatin (LIPITOR) 20 MG tablet Take 20 mg by mouth at bedtime.  3  . busPIRone (BUSPAR) 5 MG tablet Take 7.5 mg by mouth 2 (two) times daily.  3  . cloNIDine (CATAPRES) 0.2 MG tablet Take 0.2 mg by mouth 2 (two) times daily.  6  . diclofenac  (FLECTOR) 1.3 % PTCH Place 1 patch onto the skin every 12 (twelve) hours.  3  . glipiZIDE (GLUCOTROL) 10 MG tablet Take 10 mg by mouth 2 (two) times daily.  3  . hydrALAZINE (APRESOLINE) 100 MG tablet Take 100 mg by mouth 3 (three) times daily.    Marland Kitchen LEVEMIR FLEXTOUCH 100 UNIT/ML Pen Inject 15-30 Units into the skin See admin instructions. Take 30 units in the am and 15 units at bedtime.  3  . metoprolol succinate (TOPROL-XL) 25 MG 24 hr tablet Take 25 mg by mouth daily.  3  . NOVOLIN R 100 UNIT/ML injection Inject 0-12 Units into the skin See admin instructions. Per sliding scale <60 call MD and follow hypoglycemia protocol  60-149=0 units 150-200= 2 units 201-250= 4 units 251-300= 6 units 301-350= 8 units 351-400= 10 units 400> 12 units and call MD  0  . clopidogrel (PLAVIX) 75 MG tablet Take 75 mg by mouth daily.  0    Home: Home Living Family/patient expects to be discharged to:: Inpatient rehab Living Arrangements: Alone Available Help at Discharge: Family, Friend(s), Available PRN/intermittently Type of Home: Other(Comment)(ALF in [place]) Home Equipment: Dan Humphreys - 2 wheels  Functional History: Prior Function Level of Independence: Independent with assistive device(s)(in a supervised setting) Comments: per family, pt uses RW to ambulate independently, does all his basic ADL;s,  the facility does meds and meals. Functional Status:  Mobility: Bed Mobility Overal bed mobility: Needs Assistance Bed Mobility: Supine to Sit, Sit to Supine Supine to sit: Mod assist, +2 for safety/equipment Sit to supine: Mod assist, +2 for safety/equipment General bed mobility comments: pt followed tactile and gestural cue to get up.  needed truncal assist and scoot assist Transfers Overall transfer level: Needs assistance Transfers: Sit to/from Stand Sit to Stand: Mod assist, +2 physical assistance General transfer comment: hand over hand cues for hand placement, assist for forward and  up. Ambulation/Gait General Gait Details: NT    ADL:    Cognition: Cognition Overall Cognitive Status: Impaired/Different from baseline(NT formally) Arousal/Alertness: Awake/alert Orientation Level: Other (comment)(UTA expressive aphasia) Attention: Sustained Sustained Attention: Impaired Sustained Attention Impairment: Verbal basic, Functional basic Memory: (DTA) Awareness: (DTA) Behaviors: Impulsive Safety/Judgment: (DTA) Cognition Arousal/Alertness: Awake/alert Behavior During Therapy: Restless Overall Cognitive Status: Impaired/Different from baseline(NT formally) Area of Impairment: Attention, Following commands, Safety/judgement, Awareness, Problem solving Current Attention Level: Focused, Sustained Following Commands: Follows one step commands inconsistently, Follows one step commands with increased time Safety/Judgement: Decreased awareness of safety, Decreased awareness of deficits Awareness: Intellectual Problem Solving: Slow processing, Requires tactile cues, Difficulty sequencing, Requires verbal cues  Blood pressure (!) 161/82, pulse 76, temperature 99.4 F (37.4 C), temperature source Oral, resp. rate 11, height 6\' 2"  (1.88 m), weight 86.7 kg, SpO2 100 %. Physical Exam  Constitutional: He appears well-developed and well-nourished.  HENT:  Head: Normocephalic and atraumatic.  Eyes:  Only briefly opens eyes  Neck: Normal range of motion. Neck supple. No thyromegaly present.  Cardiovascular: Normal rate and regular rhythm.  Respiratory: Effort normal and breath sounds normal. No respiratory distress.  GI: Soft. Bowel sounds are normal. He exhibits no distension.  Musculoskeletal:  No edema or tenderness in extremities  Neurological:  Patient is extremely lethargic.  Aphasic with limited verbal output.  Does not follow commands. No spontaneous movement noted  Skin: Skin is warm and dry.  Psychiatric:  Unable to assess due to mentation    Results for  orders placed or performed during the hospital encounter of 03/30/18 (from the past 24 hour(s))  Sodium     Status: Abnormal   Collection Time: 04/01/18 10:34 AM  Result Value Ref Range   Sodium 146 (H) 135 - 145 mmol/L  Glucose, capillary     Status: Abnormal   Collection Time: 04/01/18 11:18 AM  Result Value Ref Range   Glucose-Capillary 194 (H) 70 - 99 mg/dL   Comment 1 Notify RN    Comment 2 Document in Chart   Sodium     Status: None   Collection Time: 04/01/18  4:25 PM  Result Value Ref Range   Sodium 145 135 - 145 mmol/L  Glucose, capillary     Status: Abnormal   Collection Time: 04/01/18  4:33 PM  Result Value Ref Range   Glucose-Capillary 219 (H) 70 - 99 mg/dL   Comment 1 Notify RN    Comment 2 Document in Chart   Glucose, capillary     Status: Abnormal   Collection Time: 04/01/18  8:44 PM  Result Value Ref Range   Glucose-Capillary 167 (H) 70 - 99 mg/dL  Sodium     Status: None   Collection Time: 04/01/18 10:40 PM  Result Value Ref Range   Sodium 138 135 - 145 mmol/L  CBC     Status: None   Collection Time: 04/02/18  4:00 AM  Result Value Ref Range   WBC 9.6 4.0 - 10.5 K/uL   RBC 4.89 4.22 - 5.81 MIL/uL   Hemoglobin 14.1 13.0 - 17.0 g/dL   HCT 16.1 09.6 - 04.5 %   MCV 90.2 80.0 - 100.0 fL   MCH 28.8 26.0 - 34.0 pg   MCHC 32.0 30.0 - 36.0 g/dL  RDW 12.9 11.5 - 15.5 %   Platelets 202 150 - 400 K/uL   nRBC 0.0 0.0 - 0.2 %  Basic metabolic panel     Status: Abnormal   Collection Time: 04/02/18  4:00 AM  Result Value Ref Range   Sodium 142 135 - 145 mmol/L   Potassium 3.9 3.5 - 5.1 mmol/L   Chloride 105 98 - 111 mmol/L   CO2 28 22 - 32 mmol/L   Glucose, Bld 203 (H) 70 - 99 mg/dL   BUN 10 8 - 23 mg/dL   Creatinine, Ser 4.091.22 0.61 - 1.24 mg/dL   Calcium 9.7 8.9 - 81.110.3 mg/dL   GFR calc non Af Amer >60 >60 mL/min   GFR calc Af Amer >60 >60 mL/min   Anion gap 9 5 - 15   Ct Head Wo Contrast  Result Date: 04/02/2018 CLINICAL DATA:  Follow-up examination for  acute stroke. EXAM: CT HEAD WITHOUT CONTRAST TECHNIQUE: Contiguous axial images were obtained from the base of the skull through the vertex without intravenous contrast. COMPARISON:  Previous MRI from 03/31/2018. FINDINGS: Brain: Extensive of all veins cytotoxic edema throughout the majority of the left MCA territory, consistent with evolving acute ischemic infarct. Overall, distribution relatively stable from previous. No evidence for hemorrhagic transformation by CT. Localized mass effect with mild attenuation of the adjacent left lateral ventricle with trace 1-2 mm left-to-right shift. No hydrocephalus or ventricular trapping. Basilar cisterns remain patent. No other new acute large vessel territory infarct. No intracranial hemorrhage. Underlying atrophy with chronic microvascular ischemic disease with chronic left PCA territory infarct noted. No extra-axial fluid collection. Vascular: Abnormal hyperdensity within the left MCA branches, compatible thrombus. Calcified atherosclerosis at the skull base. Skull: Scalp soft tissues and calvarium demonstrate no acute finding. Sinuses/Orbits: Globes and orbital soft tissues within normal limits. Paranasal sinuses and mastoids are clear. Other: None. IMPRESSION: 1. Continued interval evolution of large acute left MCA territory infarct. No evidence for hemorrhagic transformation by CT. Localized mass effect with trace 1-2 mm left-to-right shift. 2. Otherwise stable appearance of the brain. No other new acute intracranial abnormality. Electronically Signed   By: Rise MuBenjamin  McClintock M.D.   On: 04/02/2018 06:41   Mr Brain Wo Contrast  Result Date: 03/31/2018 CLINICAL DATA:  Follow-up large left MCA infarct.  Status post tPA. EXAM: MRI HEAD WITHOUT CONTRAST TECHNIQUE: Multiplanar, multiecho pulse sequences of the brain and surrounding structures were obtained without intravenous contrast. COMPARISON:  Abbreviated brain MRI 03/30/2018. Head CT, CTA, and cerebral  perfusion 03/30/2018. FINDINGS: Brain: As seen on yesterday's MRI, there is a large acute infarct involving nearly the entirety of the left MCA territory. There is new cytotoxic edema with mild sulcal effacement but no midline shift or other significant mass effect. There is minimal associated scattered petechial hemorrhage. A large chronic left PCA infarct is again noted with ex vacuo dilatation of the left lateral ventricle. There is a background of extensive chronic small vessel ischemic disease throughout the cerebral white matter and pons. Numerous chronic microhemorrhages are present in the cerebellum, brainstem, and both cerebral hemispheres including deep gray nuclei (particularly right thalamus), likely related to longstanding hypertension. Tiny chronic infarcts are noted at the posterior aspect of the left middle cerebellar peduncle. Small chronic infarcts are also present in the thalami. There is moderate cerebral atrophy, and there is extensive atrophy of the corpus callosum. There is no extra-axial fluid collection. Vascular: Abnormal appearance of the left MCA corresponding to occlusion shown on CTA. Skull and  upper cervical spine: No suspicious marrow lesion. Sinuses/Orbits: Unremarkable orbits. Paranasal sinuses and mastoid air cells are clear. Other: None. IMPRESSION: 1. Evolving large acute left MCA infarct with new cytotoxic edema and minimal petechial hemorrhage. No midline shift. 2. Large chronic left PCA infarct. 3. Severe chronic small vessel ischemic disease. Coexistent multiple sclerosis is not excluded by imaging. Electronically Signed   By: Sebastian Ache M.D.   On: 03/31/2018 11:52   Dg Swallowing Func-speech Pathology  Result Date: 04/01/2018 Objective Swallowing Evaluation: Type of Study: MBS-Modified Barium Swallow Study  Patient Details Name: ATLEY NEUBERT MRN: [id number] Date of Birth: 1950-10-06 Today's Date: 04/01/2018 Time: SLP Start Time (ACUTE ONLY): 0945 -SLP Stop Time  (ACUTE ONLY): 1007 SLP Time Calculation (min) (ACUTE ONLY): 22 min Past Medical History: No past medical history on file. Past Surgical History: Past Surgical History: Procedure Laterality Date . IR PATIENT EVAL TECH 0-60 MINS  03/30/2018 HPI: 67 y.o. male with a history of previous stroke, hypertension, diabetes, hyperlipidemia who presents with right-sided weakness and aphasia that started abruptly at 9 AM.  He was in his normal state of health and came downstairs and when his eating breakfast when he suddenly stopped responding. MRI brain 03/31/18 indicated Evolving large acute left MCA infarct with new cytotoxic edema and minimal petechial hemorrhage. No midline shift. 2. Large chronic left PCA infarct. 3. Severe chronic small vessel ischemic disease.  No data recorded Assessment / Plan / Recommendation CHL IP CLINICAL IMPRESSIONS 04/01/2018 Clinical Impression Pt demosntrates a primary oral dysphagia with sensory/motor impairment on the right leading to decreased labial seal and mild pooling in right buccal cavity. Extra time is needed for bolus formation, but pt uses a poterior head tilt instinctively to facilitate transit. Even with large impulsive sips, though liquids pooled in the pyriforms prior to swallow, only trace silent aspiration occured with thin liquids and not at all with nectar over various trials. Pt could not follow instruction to cough/clear aspirate. Given pts ability to masticate and orally manipulate soldis with extra time, recomemnd intiating a dys 2 (fine chopped) diet with nectar thick liquids with supervision. Exra care needed to clear right buccal cavity after intake. SLP will f/u for tolerance.  SLP Visit Diagnosis Dysphagia, oropharyngeal phase (R13.12) Attention and concentration deficit following -- Frontal lobe and executive function deficit following -- Impact on safety and function Mild aspiration risk   CHL IP TREATMENT RECOMMENDATION 04/01/2018 Treatment Recommendations Therapy  as outlined in treatment plan below   Prognosis 04/01/2018 Prognosis for Safe Diet Advancement Good Barriers to Reach Goals -- Barriers/Prognosis Comment -- CHL IP DIET RECOMMENDATION 04/01/2018 SLP Diet Recommendations Dysphagia 2 (Fine chop) solids;Nectar thick liquid Liquid Administration via Cup;Straw Medication Administration Whole meds with liquid Compensations Slow rate;Small sips/bites;Minimize environmental distractions;Monitor for anterior loss;Lingual sweep for clearance of pocketing Postural Changes Seated upright at 90 degrees   CHL IP OTHER RECOMMENDATIONS 04/01/2018 Recommended Consults -- Oral Care Recommendations Oral care before and after PO Other Recommendations Order thickener from pharmacy;Have oral suction available   CHL IP FOLLOW UP RECOMMENDATIONS 04/01/2018 Follow up Recommendations Inpatient Rehab   CHL IP FREQUENCY AND DURATION 04/01/2018 Speech Therapy Frequency (ACUTE ONLY) min 2x/week Treatment Duration 2 weeks      CHL IP ORAL PHASE 04/01/2018 Oral Phase Impaired Oral - Pudding Teaspoon -- Oral - Pudding Cup -- Oral - Honey Teaspoon -- Oral - Honey Cup -- Oral - Nectar Teaspoon Right pocketing in lateral sulci;Decreased bolus cohesion;Premature spillage;Delayed oral transit Oral - Nectar  Cup Right pocketing in lateral sulci;Decreased bolus cohesion;Premature spillage;Delayed oral transit Oral - Nectar Straw Right pocketing in lateral sulci;Decreased bolus cohesion;Premature spillage;Delayed oral transit Oral - Thin Teaspoon -- Oral - Thin Cup Right pocketing in lateral sulci;Decreased bolus cohesion;Premature spillage;Delayed oral transit Oral - Thin Straw Right pocketing in lateral sulci;Decreased bolus cohesion;Premature spillage;Delayed oral transit Oral - Puree Right pocketing in lateral sulci;Decreased bolus cohesion;Delayed oral transit Oral - Mech Soft Right pocketing in lateral sulci;Decreased bolus cohesion;Delayed oral transit;Lingual/palatal residue Oral - Regular -- Oral -  Multi-Consistency -- Oral - Pill Right pocketing in lateral sulci;Decreased bolus cohesion;Premature spillage;Delayed oral transit Oral Phase - Comment --  CHL IP PHARYNGEAL PHASE 04/01/2018 Pharyngeal Phase Impaired Pharyngeal- Pudding Teaspoon -- Pharyngeal -- Pharyngeal- Pudding Cup -- Pharyngeal -- Pharyngeal- Honey Teaspoon -- Pharyngeal -- Pharyngeal- Honey Cup -- Pharyngeal -- Pharyngeal- Nectar Teaspoon Delayed swallow initiation-pyriform sinuses Pharyngeal -- Pharyngeal- Nectar Cup Delayed swallow initiation-pyriform sinuses Pharyngeal -- Pharyngeal- Nectar Straw Delayed swallow initiation-pyriform sinuses Pharyngeal -- Pharyngeal- Thin Teaspoon -- Pharyngeal -- Pharyngeal- Thin Cup Delayed swallow initiation-pyriform sinuses;Penetration/Aspiration before swallow;Trace aspiration Pharyngeal Material enters airway, passes BELOW cords without attempt by patient to eject out (silent aspiration) Pharyngeal- Thin Straw Delayed swallow initiation-pyriform sinuses;Penetration/Aspiration before swallow;Trace aspiration Pharyngeal Material enters airway, passes BELOW cords without attempt by patient to eject out (silent aspiration) Pharyngeal- Puree Delayed swallow initiation-vallecula Pharyngeal -- Pharyngeal- Mechanical Soft -- Pharyngeal -- Pharyngeal- Regular -- Pharyngeal -- Pharyngeal- Multi-consistency -- Pharyngeal -- Pharyngeal- Pill -- Pharyngeal -- Pharyngeal Comment --  No flowsheet data found. Harlon Ditty, MA CCC-SLP Acute Rehabilitation Services Pager [phone] Office [phone] Claudine Mouton 04/01/2018, 10:20 AM               Assessment/Plan: Diagnosis: Large left MCA infarct Labs and images (see above) independently reviewed.  Records reviewed and summated above. Stroke: Continue secondary stroke prophylaxis and Risk Factor Modification listed below:   Antiplatelet therapy:   Blood Pressure Management:  Continue current medication with prn's with permisive HTN per primary  team Statin Agent:   Diabetes management:   ?  Right sided hemiparesis: fit for orthosis to prevent contractures (resting hand splint for day, wrist cock up splint at night, PRAFO, etc) Motor recovery: Fluoxetine  1. Does the need for close, 24 hr/day medical supervision in concert with the patient's rehab needs make it unreasonable for this patient to be served in a less intensive setting? Yes  2. Co-Morbidities requiring supervision/potential complications: HTN (monitor and provide prns in accordance with increased physical exertion and pain), DM (Monitor in accordance with exercise and adjust meds as necessary), hyperlipidemia, previous CVA with residual deficits, post-stroke dysphagia (advance diet as tolerated) 3. Due to bladder management, bowel management, safety, skin/wound care, disease management, medication administration and patient education, does the patient require 24 hr/day rehab nursing? Yes 4. Does the patient require coordinated care of a physician, rehab nurse, PT (1-2 hrs/day, 5 days/week), OT (1-2 hrs/day, 5 days/week) and SLP (1-2 hrs/day, 5 days/week) to address physical and functional deficits in the context of the above medical diagnosis(es)? Yes Addressing deficits in the following areas: balance, endurance, locomotion, strength, transferring, bowel/bladder control, bathing, dressing, feeding, grooming, toileting, cognition, speech, language, swallowing and psychosocial support 5. Can the patient actively participate in an intensive therapy program of at least 3 hrs of therapy per day at least 5 days per week? Potentially 6. The potential for patient to make measurable gains while on inpatient rehab is excellent 7. Anticipated functional outcomes upon discharge  from inpatient rehab are min assist  with PT, min assist with OT, min assist and mod assist with SLP. 8. Estimated rehab length of stay to reach the above functional goals is: 22-27 days. 9. Anticipated D/C setting:  Home 10. Anticipated post D/C treatments: HH therapy and Home excercise program 11. Overall Rehab/Functional Prognosis: good and fair  RECOMMENDATIONS: This patient's condition is appropriate for continued rehabilitative care in the following setting: Potentially CIR after completion of medical work-up, able to tolerate 3 hours of therapy per day, and discussion with daughter regarding long-term discharge disposition. Patient has agreed to participate in recommended program. Potentially Note that insurance prior authorization may be required for reimbursement for recommended care.  Comment: Rehab Admissions Coordinator to follow up.   I have personally performed a face to face diagnostic evaluation, including, but not limited to relevant history and physical exam findings, of this patient and developed relevant assessment and plan.  Additionally, I have reviewed and concur with the physician assistant's documentation above.   Maryla Morrow, MD, ABPMR Mcarthur Rossetti Angiulli, PA-C 04/02/2018

## 2018-04-02 NOTE — Progress Notes (Signed)
SLP Cancellation Note  Patient Details Name: Douglas [name] MRN: [id number] DOB: 11-30-1950   Cancelled treatment:       Reason Eval/Treat Not Completed: Patient at procedure or test/unavailable   Sahvannah Rieser, Riley NearingBonnie Caroline 04/02/2018, 12:29 PM

## 2018-04-02 NOTE — Progress Notes (Signed)
Blood sugar 241. Glucose meter not syncing to patient's chart.

## 2018-04-02 NOTE — Progress Notes (Signed)
Inpatient Rehabilitation Admissions Coordinator  I met with patient and his daughter, Maudie Mercury, at bedside. Patient had a previous CVA in 2013 and received inpt rehab services in [place]. Went home with daughter after rehab and eventually went to an ILF where they provide meals but patient was independent otherwise. Daughter was already making plans to move Dad in with her in January, but she can move him in with her at her current location. I will follow up to verify dispo tomorrow.  Danne Baxter, RN, MSN Rehab Admissions Coordinator [phone] 04/02/2018 2:51 PM

## 2018-04-02 NOTE — Progress Notes (Signed)
STROKE TEAM PROGRESS NOTE   SUBJECTIVE (INTERVAL HISTORY) Pt daughter at bedside. Pt initially sleeping but easily arousable. He is still globally aphasic and right hemiplegic. TEE pending this am. DVT also pending. Pt travelled from [facility] here for Thanksgiving, had long car ride of 13 hours. Pt had stroke in 2013 with residue right side weakness and right hemianopia. Needs help for some daily needs and walks with walker.    OBJECTIVE Vitals:   04/02/18 0700 04/02/18 0800 04/02/18 0830 04/02/18 0900  BP: (!) 161/82 (!) 164/90  (!) 161/82  Pulse: 76 83  84  Resp: 11 13  14   Temp:   99.7 F (37.6 C)   TempSrc:   Oral   SpO2: 100% 100%  99%  Weight:      Height:        CBC:  Recent Labs  Lab 03/30/18 1026  03/31/18 0223 04/02/18 0400  WBC 5.9  --  9.3 9.6  NEUTROABS 3.2  --   --   --   HGB 13.9   < > 13.2 14.1  HCT 45.0   < > 40.4 44.1  MCV 91.1  --  88.2 90.2  PLT 224  --  214 202   < > = values in this interval not displayed.    Basic Metabolic Panel:  Recent Labs  Lab 04/01/18 0407  04/01/18 2240 04/02/18 0400  NA 144   < > 138 142  K 3.7  --   --  3.9  CL 113*  --   --  105  CO2 23  --   --  28  GLUCOSE 149*  --   --  203*  BUN 8  --   --  10  CREATININE 1.29*  --   --  1.22  CALCIUM 9.0  --   --  9.7   < > = values in this interval not displayed.    Lipid Panel:     Component Value Date/Time   CHOL 109 03/31/2018 0223   TRIG 64 03/31/2018 0223   HDL 36 (L) 03/31/2018 0223   CHOLHDL 3.0 03/31/2018 0223   VLDL 13 03/31/2018 0223   LDLCALC 60 03/31/2018 0223   HgbA1c:  Lab Results  Component Value Date   HGBA1C 7.3 (H) 03/31/2018   Urine Drug Screen:     Component Value Date/Time   LABOPIA NONE DETECTED 03/31/2018 0927   COCAINSCRNUR NONE DETECTED 03/31/2018 0927   LABBENZ NONE DETECTED 03/31/2018 0927   AMPHETMU NONE DETECTED 03/31/2018 0927   THCU NONE DETECTED 03/31/2018 0927   LABBARB NONE DETECTED 03/31/2018 0927    Alcohol Level No  results found for: ETH  IMAGING  Ct Angio Head W Or Wo Contrast Ct Angio Neck W Or Wo Contrast Ct Cerebral Perfusion W Contrast 03/30/2018  IMPRESSION:  1. Large left MCA territory infarct with extremely poor collaterals.  2. Emergent large vessel occlusion of the proximal left M1 segment.  3. Moderate to high-grade segmental stenoses of the right M1 segment.  4. Moderate proximal A1 stenoses bilaterally.  5. High-grade stenosis and occlusion of the right vertebral artery at the dural margin and V4 segment.  6. High-grade stenosis of the left vertebral artery with small irregular basilar artery feeding the residual right PCA.  7. High-grade stenosis of distal right P2 segment.  8. 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     Mr Brain Wo Contrast 03/31/2018 IMPRESSION:  1. Evolving large acute left MCA infarct with new cytotoxic edema and minimal petechial hemorrhage. No midline shift.  2. Large chronic left PCA infarct.  3. Severe chronic small vessel ischemic disease.  Coexistent multiple sclerosis is not excluded by imaging.    Mr Brain Wo Contrast 03/30/2018 IMPRESSION:  1. Acute nonhemorrhagic large left MCA territory infarct.  2. Remote left PCA territory infarct.  3. Advanced white matter disease consistent with chronic microvascular ischemia.  4. Vascular changes as above.    Ir Patient Eval Tech 0-60 Mins 03/30/2018 Gaspar Skeeters A  03/30/2018 11:19 AM IR code stroke team activated, opened supplies, and getting procedure prepared to start and code stroke for IR team cancelled.   Ct Head Code Stroke Wo Contrast 03/30/2018 IMPRESSION:  1. Remote left PCA territory nonhemorrhagic infarct  2. Moderate diffuse white matter disease bilaterally.  3. No acute cortical abnormality.  4. ASPECTS is 10/10    Transthoracic Echocardiogram  03/30/2018 Study Conclusions - Left ventricle: The cavity size was  normal. Wall thickness was   increased in a pattern of moderate LVH. Systolic function was   normal. The estimated ejection fraction was in the range of 60%   to 65%. Wall motion was normal; there were no regional wall   motion abnormalities. Doppler parameters are consistent with   abnormal left ventricular relaxation (grade 1 diastolic   dysfunction). The Ee&' ratio is between 8-15, suggesting   indeterminate LV filling pressure. - Left atrium: The atrium was normal in size. - Atrial septum: No defect or patent foramen ovale was identified. - Inferior vena cava: The vessel was normal in size. The   respirophasic diameter changes were in the normal range (>= 50%),   consistent with normal central venous pressure. Impressions: - LVEF 60-65%, moderate LVH, normal wall motion, grade 1 DD,   indeterminate LV filling pressure, normal LA size, normal IVC.     PHYSICAL EXAM Blood pressure (!) 161/82, pulse 84, temperature 99.7 F (37.6 C), temperature source Oral, resp. rate 14, height 6\' 2"  (1.88 m), weight 86.7 kg, SpO2 99 %.  Pleasant middle-aged African-American male initially sleeping during round, but easily arousable. Afebrile. Head is nontraumatic. Neck is supple without bruit.    Cardiac exam no murmur or gallop, regular rate and rhythm. Lungs are clear to auscultation. Distal pulses are well felt.  Neurological Exam :  Initially sleeping but easily arousable, still globally aphasic, not following commands, able to have "yah" sound to all questions. Blinks to threat on the left but not the right. Left gaze preference barely pass midline. Right lower facial weakness. Dense right hemiplegia with only trace withdrawal in the right lower extremity. Antigravity strength on the left side upper and lower extremity. Tone is diminished on the right and deep tendon reflexes are depressed on the right. No babinski. Sensation, coordination and gait not tested.    ASSESSMENT/PLAN Mr. YGNACIO FECTEAU is a 67 y.o. male with history of previous stroke, hypertension, diabetes, hyperlipidemia who presents with right-sided weakness and aphasia. IV tPA 03/30/2018 at 11:59 AM  Stroke:  Large left MCA territory infarct - embolic pattern - unknown source, cardioembolic vs. Diffuse severe intracranial athero  Resultant  Global aphasia and right hemiplegia  CT head - Remote left PCA territory nonhemorrhagic infarct   MRI head - Acute nonhemorrhagic large left MCA territory infarct. And old left PCA infarct  CTA H&N - left M1 cut off, high grade stenosis of right M1, right P2, b/l A1,  and left VA. Chronic occlusion of right V4 and left P1  Repeat CT 04/02/18 - evolution of left large MCA infarct, trace MLS  2D Echo - EF 60 - 65%. No cardiac source of emboli identified.   TEE pending  LE venous doppler pending - given long car ride prior to stroke onset  [place] good candidate for loop recorder here  LDL - 60  HgbA1c - 7.3  UDS - negative  VTE prophylaxis - lovenox  clopidogrel 75 mg daily prior to admission, now on ASA 325 and plavix 75 given significant intracranial stenosis.   Ongoing aggressive stroke risk factor management  Therapy recommendations:  pending  Disposition:  Pending  Cerebral edema  CT and MRI showed large left MCA infarct with mild MLS  Was on 3% saline   Na 144->142  3% saline discontinued overnight  Continue IVF for now  Hx of stroke  2013 in [place]  CT showed old left PCA territory infarct  Residue right hemiparesis, hemianopia  Could walk with walker and need some help ADLs at baseline  Hypertension  Stable off cardene . SBP goal < 180 mmHg . On norvasc, clonidine and metoprolol . Long-term BP goal 130-150 given left M1 cut off  Hyperlipidemia  Lipid lowering medication PTA:  Lipitor 20 mg daily  LDL 60, goal < 70  Current lipid lowering medication: lipitor 20  Continue statin at discharge  Diabetes  A1C  7.3, goal < 7.0  Uncontrolled  Home meds - glipizide and levemir  SSI  CBG monitoring  On home glipizide  Other Stroke Risk Factors  Advanced age  Hx stroke/TIA  Other Active Problems  Elevated Creatinine -1.80->1.22   Hospital day # 3  This patient is critically ill and at significant risk of neurological worsening, death and care requires constant monitoring of vital signs, hemodynamics,respiratory and cardiac monitoring, extensive review of multiple databases, frequent neurological assessment, discussion with family, other specialists and medical decision making of high complexity. I had long discussion with daughter at bedside, updated pt current condition, treatment plan and potential prognosis. She expressed understanding and appreciation. I spent 30 minutes of neurocritical care time  in the care of  this patient.  Marvel PlanJindong Cephus Tupy, MD PhD Stroke Neurology 04/02/2018 10:39 AM    To contact Stroke Continuity provider, please refer to [email or web address]. After hours, contact General Neurology

## 2018-04-02 NOTE — Anesthesia Preprocedure Evaluation (Addendum)
Anesthesia Evaluation  Patient identified by MRN, date of birth, ID band Patient awake    Reviewed: Allergy & Precautions, NPO status , Patient's Chart, lab work & pertinent test results, reviewed documented beta blocker date and time   History of Anesthesia Complications Negative for: history of anesthetic complications  Airway Mallampati: II  TM Distance: >3 FB     Dental  (+) Dental Advisory Given, Chipped   Pulmonary neg pulmonary ROS,    breath sounds clear to auscultation       Cardiovascular hypertension, Pt. on medications and Pt. on home beta blockers  Rhythm:Regular Rate:Normal   '19 TTE - Moderate LVH. EF 60% to 65%. Grade 1 diastolic dysfunction.    Neuro/Psych CVA (right sided weakness, speech difficulties), Residual Symptoms negative psych ROS   GI/Hepatic negative GI ROS, Neg liver ROS,   Endo/Other  diabetes, Type 2, Oral Hypoglycemic Agents, Insulin Dependent  Renal/GU negative Renal ROS     Musculoskeletal negative musculoskeletal ROS (+)   Abdominal   Peds  Hematology negative hematology ROS (+)   Anesthesia Other Findings   Reproductive/Obstetrics                            Anesthesia Physical Anesthesia Plan  ASA: III  Anesthesia Plan: MAC   Post-op Pain Management:    Induction: Intravenous  PONV Risk Score and Plan: 1 and Propofol infusion and Treatment may vary due to age or medical condition  Airway Management Planned: Nasal Cannula and Natural Airway  Additional Equipment: None  Intra-op Plan:   Post-operative Plan:   Informed Consent: I have reviewed the patients History and Physical, chart, labs and discussed the procedure including the risks, benefits and alternatives for the proposed anesthesia with the patient or authorized representative who has indicated his/her understanding and acceptance.     Plan Discussed with: CRNA and  Anesthesiologist  Anesthesia Plan Comments:        Anesthesia Quick Evaluation

## 2018-04-02 NOTE — Progress Notes (Addendum)
Pt transferred from 4N ICU with the diagnosis of stroke, pt alert with global aphasia but follows simple command, settled in bed with call light within pt's reach, tele monitor put and verified on pt, safety concern addressed, was however reassured and will continue to monitor, v/s stable. Obasogie-Asidi, Brittanny Levenhagen Efe  Pt had a temp of 102.1 on arrival to unit, had tylenol 650mg  earlier prior to transfer at 1948, Dr Aroor paged and notified, said to recheck temp in a couple of hours, pt still drowsy but easy to arouse, will continue to monitor. Obasogie-Asidi, Berl Bonfanti Efe

## 2018-04-02 NOTE — H&P (Signed)
Douglas [name] is a 67 y.o. male who has presented today for surgery, with the diagnosis of stroke. The various methods of treatment have been discussed with the patient and family. After consideration of risks, benefits and other options for treatment, the patient has consented to Procedure(s): TRANSESOPHAGEAL ECHOCARDIOGRAM (TEE) (N/A) as a surgical intervention . The patient's history has been reviewed, patient examined, no change in status, stable for surgery. I have reviewed the patient's chart and labs. Questions were answered to the patient's satisfaction.   Frazier Balfour C. Duke Salviaandolph, MD, [facility]  04/02/2018 9:18 AM

## 2018-04-02 NOTE — Progress Notes (Signed)
Inpatient Diabetes Program Recommendations  AACE/ADA: New Consensus Statement on Inpatient Glycemic Control (2015)  Target Ranges:  Prepandial:   less than 140 mg/dL      Peak postprandial:   less than 180 mg/dL (1-2 hours)      Critically ill patients:  140 - 180 mg/dL   Lab Results  Component Value Date   GLUCAP 182 (H) 04/02/2018   HGBA1C 7.3 (H) 03/31/2018    Review of Glycemic Control Results for Ennis FortsFLAKES, WILLIAMS L (MRN [id number]) as of 04/02/2018 11:14  Ref. Range 04/01/2018 16:33 04/01/2018 20:44 04/02/2018 08:40  Glucose-Capillary Latest Ref Range: 70 - 99 mg/dL 409219 (H) 811167 (H) 914182 (H)   Diabetes history: Type 2 DM Outpatient Diabetes medications: Novolin R 0-12 units TID, Glipizide 10 mg BID, Levemir 30 units QAM, 15 units QAM Current orders for Inpatient glycemic control: Novolog 0-15 units TID, Glipizide 5 mg BID  Inpatient Diabetes Program Recommendations:    Consider restarting portion of home basal: Levemir 10 units QD.  Thanks, Lujean RaveLauren Terril Amaro, MSN, RNC-OB Diabetes Coordinator [phone] (8a-5p)

## 2018-04-02 NOTE — Progress Notes (Signed)
Physical Therapy Treatment Patient Details Name: Douglas [name] MRN: [id number] DOB: 07-Aug-1950 Today's Date: 04/02/2018    History of Present Illness 67 y.o. male with a history of previous stroke, hypertension, diabetes, hyperlipidemia who presents with right-sided weakness and aphasia that started abruptly at 9 AM.  He was in his normal state of health and came downstairs and when his eating breakfast when he suddenly stopped responding to his daughters.  MRI brain 03/31/18 indicated Evolving large acute left MCA infarct with new cytotoxic edema and minimal petechial hemorrhage. No midline shift.    PT Comments    Pt still improving overall, but today he was not as participative.  Pushing behaviors continue in sitting and standing.  At times they are inhibited well and at other times the L side overpowers the right side.  Emphasis on sitting balance, and  standing in the steady.      Follow Up Recommendations  CIR;Supervision/Assistance - 24 hour;Other (comment)     Equipment Recommendations  Other (comment)(TBA)    Recommendations for Other Services       Precautions / Restrictions Precautions Precautions: Fall Precaution Comments: Right hemi    Mobility  Bed Mobility Overal bed mobility: Needs Assistance Bed Mobility: Rolling;Sidelying to Sit Rolling: Mod assist;+2 for safety/equipment Sidelying to sit: Mod assist;+2 for safety/equipment       General bed mobility comments: Mod A +2 for rolling to left and then pushing up into sitting  Transfers Overall transfer level: Needs assistance   Transfers: Sit to/from Stand Sit to Stand: Max assist;+2 physical assistance         General transfer comment: Max A +2 to power up into standing and then gain balance. Pt with significant pushing to left   Ambulation/Gait             General Gait Details: NT   Stairs             Wheelchair Mobility    Modified Rankin (Stroke Patients Only) Modified  Rankin (Stroke Patients Only) Pre-Morbid Rankin Score: No significant disability Modified Rankin: Severe disability     Balance Overall balance assessment: Needs assistance Sitting-balance support: No upper extremity supported;Single extremity supported Sitting balance-Leahy Scale: Fair     Standing balance support: During functional activity;Bilateral upper extremity supported Standing balance-Leahy Scale: Poor Standing balance comment: Like in sitting, pt pushes fairly heavily to the R standing in the STEDY.  He is not able to coordinate holding with the R UE.  We will have to use the STEDY sparingly at present                            Cognition Arousal/Alertness: Awake/alert Behavior During Therapy: Flat affect Overall Cognitive Status: Impaired/Different from baseline Area of Impairment: Attention;Following commands;Safety/judgement;Awareness;Problem solving                   Current Attention Level: Focused;Sustained   Following Commands: Follows one step commands inconsistently;Follows one step commands with increased time Safety/Judgement: Decreased awareness of safety;Decreased awareness of deficits Awareness: Intellectual Problem Solving: Slow processing;Requires tactile cues;Difficulty sequencing;Requires verbal cues General Comments: Pt requiring increased time and cues throughout ADLs.       Exercises Other Exercises Other Exercises: bil LE ROM exercise prior to mobility    General Comments General comments (skin integrity, edema, etc.): Daughter present and reporting that he has good family support      Pertinent Vitals/Pain Pain Assessment: Faces Faces  Pain Scale: No hurt Pain Intervention(s): Monitored during session    Home Living                      Prior Function            PT Goals (current goals can now be found in the care plan section) Acute Rehab PT Goals Patient Stated Goal: unstated PT Goal Formulation:  Patient unable to participate in goal setting Time For Goal Achievement: 04/14/18 Potential to Achieve Goals: Fair Progress towards PT goals: Progressing toward goals    Frequency    Min 3X/week      PT Plan Current plan remains appropriate    Co-evaluation PT/OT/SLP Co-Evaluation/Treatment: Yes Reason for Co-Treatment: Complexity of the patient's impairments (multi-system involvement) PT goals addressed during session: Mobility/safety with mobility OT goals addressed during session: ADL's and self-care      AM-PAC PT "6 Clicks" Mobility   Outcome Measure  Help needed turning from your back to your side while in a flat bed without using bedrails?: A Lot Help needed moving from lying on your back to sitting on the side of a flat bed without using bedrails?: A Lot Help needed moving to and from a bed to a chair (including a wheelchair)?: A Lot Help needed standing up from a chair using your arms (e.g., wheelchair or bedside chair)?: A Lot Help needed to walk in hospital room?: Total Help needed climbing 3-5 steps with a railing? : Total 6 Click Score: 10    End of Session   Activity Tolerance: Patient tolerated treatment well Patient left: in chair;with call bell/phone within reach;with family/visitor present;with chair alarm set Nurse Communication: Mobility status PT Visit Diagnosis: Other abnormalities of gait and mobility (R26.89);Hemiplegia and hemiparesis Hemiplegia - Right/Left: Right Hemiplegia - dominant/non-dominant: Dominant Hemiplegia - caused by: Cerebral infarction     Time: [id number] PT Time Calculation (min) (ACUTE ONLY): 33 min  Charges:  $Therapeutic Activity: 8-22 mins                     04/02/2018  Berkey BingKen Andreal Vultaggio, PT Acute Rehabilitation Services [phone]  (pager) [phone]  (office)   Douglas [name] 04/02/2018, 6:17 PM

## 2018-04-02 NOTE — Transfer of Care (Signed)
Immediate Anesthesia Transfer of Care Note  Patient: Douglas [name]  Procedure(s) Performed: TRANSESOPHAGEAL ECHOCARDIOGRAM (TEE) (N/A )  Patient Location: PACU  Anesthesia Type:MAC  Level of Consciousness: drowsy  Airway & Oxygen Therapy: Patient Spontanous Breathing and Patient connected to nasal cannula oxygen  Post-op Assessment: Report given to RN and Post -op Vital signs reviewed and stable  Post vital signs: Reviewed and stable  Last Vitals:  Vitals Value Taken Time  BP 141/55 04/02/2018 12:52 PM  Temp    Pulse 65 04/02/2018 12:53 PM  Resp 18 04/02/2018 12:53 PM  SpO2 100 % 04/02/2018 12:53 PM  Vitals shown include unvalidated device data.  Last Pain:  Vitals:   04/02/18 1139  TempSrc: Oral  PainSc: 0-No pain      Patients Stated Pain Goal: 0 (76/72/09 4709)  Complications: No apparent anesthesia complications

## 2018-04-02 NOTE — Progress Notes (Signed)
Occupational Therapy Treatment Patient Details Name: Douglas [name] MRN: [id number] DOB: 1950/11/21 Today's Date: 04/02/2018    History of present illness 67 y.o. male with a history of previous stroke, hypertension, diabetes, hyperlipidemia who presents with right-sided weakness and aphasia that started abruptly at 9 AM.  He was in his normal state of health and came downstairs and when his eating breakfast when he suddenly stopped responding to his daughters.  MRI brain 03/31/18 indicated Evolving large acute left MCA infarct with new cytotoxic edema and minimal petechial hemorrhage. No midline shift.   OT comments  Returned for second visit to assess OOB activity and functional mobility. Daughter present throughout session and reporting that pt has good family support and was independent with BADLs PTA. Pt requiring Mod A +2 for bed mobility. Requiring Max A +2 for stand pivot to [facility] and then of stedy to perform transfer to recliner. Pt requiring Max A +2 for toileting/toilet hygiene. Pt presenting with significant push to right in standing. Continue to recommend dc to CIR and will continue to follow acutely as admitted.    Follow Up Recommendations  CIR;Supervision/Assistance - 24 hour    Equipment Recommendations  Other (comment)(Defer to next venue)    Recommendations for Other Services PT consult;Rehab consult;Speech consult    Precautions / Restrictions Precautions Precautions: Fall Precaution Comments: Right hemi       Mobility Bed Mobility Overal bed mobility: Needs Assistance Bed Mobility: Rolling;Sidelying to Sit Rolling: Mod assist;+2 for safety/equipment Sidelying to sit: Mod assist;+2 for safety/equipment       General bed mobility comments: Mod A +2 for rolling to left and then pushing up into sitting  Transfers Overall transfer level: Needs assistance   Transfers: Sit to/from Stand Sit to Stand: Max assist;+2 physical assistance         General  transfer comment: Max A +2 to power up into standing and then gain balance. Pt with significant pushing to left     Balance Overall balance assessment: Needs assistance Sitting-balance support: No upper extremity supported;Single extremity supported Sitting balance-Leahy Scale: Fair     Standing balance support: During functional activity;Bilateral upper extremity supported Standing balance-Leahy Scale: Poor Standing balance comment: Requiring physical A to maintain standing                           ADL either performed or assessed with clinical judgement   ADL Overall ADL's : Needs assistance/impaired     Grooming: Minimal assistance;Oral care;Bed level Grooming Details (indicate cue type and reason): Min A to manage swab and drip into cup.  Max A for holding cup in R hand. Pt able to bring swab to mouth and perform brushing motion but requires Max cues to dip swab into cup. Upper Body Bathing: Moderate assistance;Bed level   Lower Body Bathing: Maximal assistance;Bed level   Upper Body Dressing : Moderate assistance;Bed level   Lower Body Dressing: Maximal assistance;Bed level Lower Body Dressing Details (indicate cue type and reason): Max A for donning socks Toilet Transfer: Maximal assistance;+2 for physical assistance;BSC(stedy) Toilet Transfer Details (indicate cue type and reason): Max A +2 to perform sit<>Stand with stedy and maintain standing balance. Pt with increased strength at left side and tendency for pushing to the right. Requiring Max cues for postural corrections.  Toileting- Clothing Manipulation and Hygiene: Maximal assistance;+2 for physical assistance Toileting - Clothing Manipulation Details (indicate cue type and reason): Max A +2 for sit<>stand and then  to perform toilet hygiene. Pt requiring Max A to maintain standing balance.      Functional mobility during ADLs: Maximal assistance;+2 for physical assistance(stedy) General ADL Comments: Pt  performing sit<>stand with stedy and requiring Max A +2. Pt requiring Max A +2 for toilet hygiene.      Vision   Vision Assessment?: Yes Tracking/Visual Pursuits: Requires cues, head turns, or add eye shifts to track;Other (comment)(Decreased tracking to right visual field) Additional Comments: Pt with decreased gaze to right; turning hear right, but not moving eyes laterally to right once past midline. Need to continue to assess.   Perception     Praxis      Cognition Arousal/Alertness: Awake/alert Behavior During Therapy: Flat affect Overall Cognitive Status: Impaired/Different from baseline Area of Impairment: Attention;Following commands;Safety/judgement;Awareness;Problem solving                   Current Attention Level: Focused;Sustained   Following Commands: Follows one step commands inconsistently;Follows one step commands with increased time Safety/Judgement: Decreased awareness of safety;Decreased awareness of deficits Awareness: Intellectual Problem Solving: Slow processing;Requires tactile cues;Difficulty sequencing;Requires verbal cues General Comments: Pt requiring increased time and cues throughout ADLs.         Exercises     Shoulder Instructions       General Comments Daughter present and reporting that he has good family support    Pertinent Vitals/ Pain       Pain Assessment: Faces Faces Pain Scale: No hurt Pain Intervention(s): Monitored during session;Limited activity within patient's tolerance;Repositioned  Home Living Family/patient expects to be discharged to:: Inpatient rehab Living Arrangements: Alone Available Help at Discharge: Family;Friend(s);Available PRN/intermittently Type of [place]: [place](Comment[place]                       Home Equipment: Dan HumphreysWalker - 2 wheels   Additional Comments: Information collected from chart review. No family present to confirm      Prior Functioning/Environment Level of  Independence: Independent(in a supervised setting)        Comments: per family, pt uses RW to ambulate independently, does all his basic ADL;s,  the facility does meds and meals.   Frequency  Min 2X/week        Progress Toward Goals  OT Goals(current goals can now be found in the care plan section)  Progress towards OT goals: Progressing toward goals  Acute Rehab OT Goals Patient Stated Goal: unstated OT Goal Formulation: Patient unable to participate in goal setting Time For Goal Achievement: 04/16/18 Potential to Achieve Goals: Good  Plan Discharge plan remains appropriate    Co-evaluation    PT/OT/SLP Co-Evaluation/Treatment: Yes Reason for Co-Treatment: Complexity of the patient's impairments (multi-system involvement);For patient/therapist safety;Necessary to address cognition/behavior during functional activity;To address functional/ADL transfers   OT goals addressed during session: ADL's and self-care      AM-PAC OT "6 Clicks" Daily Activity     Outcome Measure   Help from another person eating meals?: A Lot Help from another person taking care of personal grooming?: A Lot Help from another person toileting, which includes using toliet, bedpan, or urinal?: A Lot Help from another person bathing (including washing, rinsing, drying)?: A Lot Help from another person to put on and taking off regular upper body clothing?: A Lot Help from another person to put on and taking off regular lower body clothing?: A Lot 6 Click Score: 12    End of Session Equipment Utilized During Treatment: Other (comment)(Stedy)  OT Visit Diagnosis: Unsteadiness on feet (R26.81);Other abnormalities of gait and mobility (R26.89);Muscle weakness (generalized) (M62.81);Other symptoms and signs involving cognitive function;Hemiplegia and hemiparesis Hemiplegia - Right/Left: Right   Activity Tolerance Patient tolerated treatment well   Patient Left with call bell/phone within reach;with  nursing/sitter in room;in chair;with chair alarm set;with family/visitor present   Nurse Communication Mobility status        Time: 1610-9604 OT Time Calculation (min): 33 min  Charges: OT General Charges $OT Visit: 1 Visit OT Evaluation $OT Eval Moderate Complexity: 1 Mod OT Treatments $Self Care/Home Management : 8-22 mins  Raziya Aveni MSOT, OTR/L Acute Rehab Pager: [phone] Office: [phone]   Theodoro Grist Renn Dirocco 04/02/2018, 4:05 PM

## 2018-04-02 NOTE — Anesthesia Postprocedure Evaluation (Signed)
Anesthesia Post Note  Patient: Douglas [name]  Procedure(s) Performed: TRANSESOPHAGEAL ECHOCARDIOGRAM (TEE) (N/A )     Patient location during evaluation: PACU Anesthesia Type: MAC Level of consciousness: awake and alert Pain management: pain level controlled Vital Signs Assessment: post-procedure vital signs reviewed and stable Respiratory status: spontaneous breathing, nonlabored ventilation and respiratory function stable Cardiovascular status: stable and blood pressure returned to baseline Anesthetic complications: no    Last Vitals:  Vitals:   04/02/18 1254 04/02/18 1300  BP: (!) 141/55 134/66  Pulse: 65 63  Resp: 18 15  Temp: 36.9 C   SpO2: 100% 100%    Last Pain:  Vitals:   04/02/18 1300  TempSrc:   PainSc: 0-No pain                 Audry Pili

## 2018-04-02 NOTE — Evaluation (Addendum)
Occupational Therapy Evaluation Patient Details Name: Douglas [name] L Marschall MRN: [id number] DOB: [birth year] Today's Date: 04/02/2018    History of Present Illness 67 y.o. male with a history of previous stroke, hypertension, diabetes, hyperlipidemia who presents with right-sided weakness and aphasia that started abruptly at 9 AM.  He was in his normal state of health and came downstairs and when his eating breakfast when he suddenly stopped responding to his daughters.  MRI brain 03/31/18 indicated Evolving large acute left MCA infarct with new cytotoxic edema and minimal petechial hemorrhage. No midline shift.   Clinical Impression   Per chart review, pt was living at an AFL alone and performed BADLs; no family present to confirm and pt with speech deficits. Pt currently requiring Min A for grooming at bed level with Max cues, Mod A for UB ADLs, and Max A for LB ADLs. Pt presenting with decreased cognition, vision, active movement at RUE, and balance. Pt agreeable to participate and stating "yeah" in answer to questions. Defered OOB activity for safety and will return to assess functional mobility with increased assistance for safety. Pt will require further acute OT to facilitate safe dc. Recommend dc to CIR for intensive OT to optimize safety, independence with ADLs, and return to PLOF.      Follow Up Recommendations  CIR;Supervision/Assistance - 24 hour    Equipment Recommendations  Other (comment)(Defer to next venue)    Recommendations for Other Services PT consult;Rehab consult;Speech consult     Precautions / Restrictions Precautions Precautions: Fall Precaution Comments: Right hemi      Mobility Bed Mobility                  Transfers                      Balance                                           ADL either performed or assessed with clinical judgement   ADL Overall ADL's : Needs assistance/impaired     Grooming: Minimal  assistance;Oral care;Bed level Grooming Details (indicate cue type and reason): Min A to manage swab and drip into cup.  Max A for holding cup in R hand. Pt able to bring swab to mouth and perform brushing motion but requires Max cues to dip swab into cup. Upper Body Bathing: Moderate assistance;Bed level   Lower Body Bathing: Maximal assistance;Bed level   Upper Body Dressing : Moderate assistance;Bed level   Lower Body Dressing: Maximal assistance;Bed level Lower Body Dressing Details (indicate cue type and reason): Max A for donning socks               General ADL Comments: Pt presenting with decreased balance, functional use of RUE, cognition, and vision.     Vision   Vision Assessment?: Yes Tracking/Visual Pursuits: Requires cues, head turns, or add eye shifts to track;Other (comment)(Decreased tracking to right visual field) Additional Comments: Pt with decreased gaze to right; turning hear right, but not moving eyes laterally to right once past midline. Need to continue to assess.     Perception     Praxis      Pertinent Vitals/Pain Pain Assessment: Faces Faces Pain Scale: No hurt Pain Intervention(s): Monitored during session;Limited activity within patient's tolerance;Repositioned     Hand Dominance Right   Extremity/Trunk Assessment Upper Extremity  Assessment Upper Extremity Assessment: RUE deficits/detail RUE Deficits / Details: Increased tone and noted flexion. No active movement RUE Coordination: decreased fine motor;decreased gross motor   Lower Extremity Assessment Lower Extremity Assessment: Defer to PT evaluation       Communication Communication Communication: Expressive difficulties   Cognition Arousal/Alertness: Awake/alert Behavior During Therapy: Flat affect Overall Cognitive Status: Impaired/Different from baseline Area of Impairment: Attention;Following commands;Safety/judgement;Awareness;Problem solving                    Current Attention Level: Focused;Sustained   Following Commands: Follows one step commands inconsistently;Follows one step commands with increased time Safety/Judgement: Decreased awareness of safety;Decreased awareness of deficits Awareness: Intellectual Problem Solving: Slow processing;Requires tactile cues;Difficulty sequencing;Requires verbal cues General Comments: Pt requiring increased time and cues throughout ADLs.    General Comments  RN present throughout. elevating RUE on pillow to optimize positioning    Exercises     Shoulder Instructions      Home Living Family/patient expects to be discharged to:: Inpatient rehab Living Arrangements: Alone Available Help at Discharge: Family;Friend(s);Available PRN/intermittently Type of Home: Other(Comment)(ALF in [place])                       Home Equipment: Dan Humphreys - 2 wheels   Additional Comments: Information collected from chart review. No family present to confirm      Prior Functioning/Environment Level of Independence: Independent(in a supervised setting)        Comments: per family, pt uses RW to ambulate independently, does all his basic ADL;s,  the facility does meds and meals.        OT Problem List: Decreased strength;Decreased range of motion;Decreased activity tolerance;Impaired balance (sitting and/or standing);Decreased cognition;Decreased safety awareness;Decreased knowledge of use of DME or AE;Decreased coordination;Impaired vision/perception;Decreased knowledge of precautions;Impaired UE functional use      OT Treatment/Interventions: Self-care/ADL training;Therapeutic exercise;Energy conservation;DME and/or AE instruction;Therapeutic activities;Patient/family education    OT Goals(Current goals can be found in the care plan section) Acute Rehab OT Goals Patient Stated Goal: unstated OT Goal Formulation: Patient unable to participate in goal setting Time For Goal Achievement:  04/16/18 Potential to Achieve Goals: Good  OT Frequency: Min 2X/week   Barriers to D/C:            Co-evaluation              AM-PAC OT "6 Clicks" Daily Activity     Outcome Measure Help from another person eating meals?: A Lot Help from another person taking care of personal grooming?: A Lot Help from another person toileting, which includes using toliet, bedpan, or urinal?: A Lot Help from another person bathing (including washing, rinsing, drying)?: A Lot Help from another person to put on and taking off regular upper body clothing?: A Lot Help from another person to put on and taking off regular lower body clothing?: A Lot 6 Click Score: 12   End of Session Nurse Communication: Mobility status  Activity Tolerance: Patient tolerated treatment well Patient left: in bed;with call bell/phone within reach;with nursing/sitter in room  OT Visit Diagnosis: Unsteadiness on feet (R26.81);Other abnormalities of gait and mobility (R26.89);Muscle weakness (generalized) (M62.81);Other symptoms and signs involving cognitive function;Hemiplegia and hemiparesis Hemiplegia - Right/Left: Right                Time: 9604-5409 OT Time Calculation (min): 11 min Charges:  OT General Charges $OT Visit: 1 Visit OT Evaluation $OT Eval Moderate Complexity: 1 Mod  Dinari Stgermaine  Elisandra Deshmukh MSOT, OTR/L Acute Rehab Pager: [phone] Office: [phone]  Theodoro Grist Madissen Wyse 04/02/2018, 1:25 PM

## 2018-04-02 NOTE — Plan of Care (Signed)
  Problem: Coping: Goal: Level of anxiety will decrease Outcome: Progressing   Problem: Pain Managment: Goal: General experience of comfort will improve Outcome: Progressing   Problem: Self-Care: Goal: Ability to participate in self-care as condition permits will improve Outcome: Progressing   Problem: Ischemic Stroke/TIA Tissue Perfusion: Goal: Complications of ischemic stroke/TIA will be minimized Outcome: Progressing

## 2018-04-02 NOTE — Progress Notes (Signed)
Neurology paged about patient's NIH going from 19 to 21. Aroor reviewed am CT and is okay with patient still getting transferred to 3W. Reported to RN taking patient and will transfer patient as planned.

## 2018-04-03 ENCOUNTER — Inpatient Hospital Stay (HOSPITAL_COMMUNITY): Payer: Medicare Other

## 2018-04-03 ENCOUNTER — Encounter (HOSPITAL_COMMUNITY): Payer: Self-pay

## 2018-04-03 DIAGNOSIS — R509 Fever, unspecified: Secondary | ICD-10-CM

## 2018-04-03 LAB — BASIC METABOLIC PANEL
ANION GAP: 11 (ref 5–15)
BUN: 11 mg/dL (ref 8–23)
CALCIUM: 8.5 mg/dL — AB (ref 8.9–10.3)
CO2: 24 mmol/L (ref 22–32)
Chloride: 105 mmol/L (ref 98–111)
Creatinine, Ser: 1.33 mg/dL — ABNORMAL HIGH (ref 0.61–1.24)
GFR calc Af Amer: >60 mL/min (ref >60)
GFR calc non Af Amer: 55 mL/min — ABNORMAL LOW (ref >60)
Glucose, Bld: 206 mg/dL — ABNORMAL HIGH (ref 70–99)
Potassium: 3.5 mmol/L (ref 3.5–5.1)
Sodium: 140 mmol/L (ref 135–145)

## 2018-04-03 LAB — GLUCOSE, CAPILLARY
Glucose-Capillary: 241 mg/dL — ABNORMAL HIGH (ref 70–99)
Glucose-Capillary: 204 mg/dL — ABNORMAL HIGH (ref 70–99)
Glucose-Capillary: 222 mg/dL — ABNORMAL HIGH (ref 70–99)
Glucose-Capillary: 252 mg/dL — ABNORMAL HIGH (ref 70–99)
Glucose-Capillary: 151 mg/dL — ABNORMAL HIGH (ref 70–99)

## 2018-04-03 LAB — CBC
HCT: 37.5 % — ABNORMAL LOW (ref 39.0–52.0)
Hemoglobin: 12.2 g/dL — ABNORMAL LOW (ref 13.0–17.0)
MCH: 28.9 pg (ref 26.0–34.0)
MCHC: 32.5 g/dL (ref 30.0–36.0)
MCV: 88.9 fL (ref 80.0–100.0)
NRBC: 0 % (ref 0.0–0.2)
Platelets: 160 10*3/uL (ref 150–400)
RBC: 4.22 MIL/uL (ref 4.22–5.81)
RDW: 12.2 % (ref 11.5–15.5)
WBC: 8 10*3/uL (ref 4.0–10.5)

## 2018-04-03 LAB — URINALYSIS, ROUTINE W REFLEX MICROSCOPIC
Bilirubin Urine: NEGATIVE
Glucose, UA: >=500 mg/dL — AB
Hgb urine dipstick: NEGATIVE
Ketones, ur: NEGATIVE mg/dL
Leukocytes, UA: NEGATIVE
Nitrite: POSITIVE — AB
PROTEIN: NEGATIVE mg/dL
Specific Gravity, Urine: 1.015 (ref 1.005–1.030)
pH: 5 (ref 5.0–8.0)

## 2018-04-03 MED ORDER — HYDRALAZINE HCL 50 MG PO TABS
50.0000 mg | ORAL_TABLET | Freq: Three times a day (TID) | ORAL | Status: DC
  Administered 2018-04-03 – 2018-04-04 (×2): 50 mg via ORAL
  Filled 2018-04-03 (×2): qty 1

## 2018-04-03 MED ORDER — INSULIN DETEMIR 100 UNIT/ML ~~LOC~~ SOLN
15.0000 [IU] | Freq: Two times a day (BID) | SUBCUTANEOUS | Status: DC
  Administered 2018-04-04: 15 [IU] via SUBCUTANEOUS
  Filled 2018-04-03 (×2): qty 0.15

## 2018-04-03 MED ORDER — HYDRALAZINE HCL 100 MG PO TABS: 100.0000 mg | ORAL_TABLET | Freq: Three times a day (TID) | ORAL | Status: DC

## 2018-04-03 MED ORDER — METOPROLOL SUCCINATE ER 25 MG PO TB24
50.0000 mg | ORAL_TABLET | Freq: Every day | ORAL | Status: DC
  Administered 2018-04-03 – 2018-04-04 (×2): 50 mg via ORAL
  Filled 2018-04-03 (×2): qty 2

## 2018-04-03 MED ORDER — INSULIN DETEMIR 100 UNIT/ML ~~LOC~~ SOLN
10.0000 [IU] | Freq: Every day | SUBCUTANEOUS | Status: DC
  Administered 2018-04-03: 10 [IU] via SUBCUTANEOUS
  Filled 2018-04-03: qty 0.1

## 2018-04-03 NOTE — Progress Notes (Signed)
Inpatient Rehabilitation Admissions Coordinator  Noted patient with max temp last pm of 102.1. I will follow up tomorrow for possible admit pending fever work up.  Ottie GlazierBarbara Lavoy Bernards, RN, MSN Rehab Admissions Coordinator [phone] 04/03/2018 11:55 AM

## 2018-04-03 NOTE — Progress Notes (Signed)
  Speech Language Pathology Treatment: Dysphagia;Cognitive-Linquistic  Patient Details Name: Douglas [name] MRN: [id number] DOB: October 17, 1950 Today's Date: 04/03/2018 Time: [id number] SLP Time Calculation (min) (ACUTE ONLY): 19 min  Assessment / Plan / Recommendation Clinical Impression  Skilled treatment session focused on dysphagia and communication goals. SLP facilitiated session by providing set-up assist for breakfast tray. Pt with right in attention and benefited from having items placed on his left with plate rotated to increase self-feeding. Notably decreased right pocketing with only minimal residuals that pt cleared with liquid wash. Pt with improved labial seal when consuming nectar thick liquids by cup. No overt s/s of aspiration noted with PO intake. Education provided to nurse on current diet and recommendations. SLP also facilitiated session by providing Max A multimodal cues for use of thumbs up/down for yes/no. Pt unable to imitate or implement. Pt with verbal yes to several questions related to condiments but indication of "no."    HPI HPI: 67 y.o. male with a history of previous stroke, hypertension, diabetes, hyperlipidemia who presents with right-sided weakness and aphasia that started abruptly at 9 AM.  He was in his normal state of health and came downstairs and when his eating breakfast when he suddenly stopped responding. MRI brain 03/31/18 indicated Evolving large acute left MCA infarct with new cytotoxic edema and minimal petechial hemorrhage. No midline shift. 2. Large chronic left PCA infarct. 3. Severe chronic small vessel ischemic disease.       SLP Plan  Continue with current plan of care       Recommendations  Diet recommendations: Dysphagia 2 (fine chop);Nectar-thick liquid Liquids provided via: Cup;Straw Medication Administration: Whole meds with puree Supervision: Full supervision/cueing for compensatory strategies Compensations: Slow rate;Small  sips/bites;Minimize environmental distractions;Monitor for anterior loss;Lingual sweep for clearance of pocketing Postural Changes and/or Swallow Maneuvers: Seated upright 90 degrees                Oral Care Recommendations: Oral care BID Follow up Recommendations: Inpatient Rehab SLP Visit Diagnosis: Dysphagia, oropharyngeal phase (R13.12);Aphasia (R47.01) Plan: Continue with current plan of care       GO                Antanasia Kaczynski 04/03/2018, 9:11 AM

## 2018-04-03 NOTE — Progress Notes (Addendum)
STROKE TEAM PROGRESS NOTE   SUBJECTIVE (INTERVAL HISTORY) Pt daughter at bedside. Pt awake, attempting to speak, though is still globally aphasic and right hemiplegic. TEE and DVT neg. Temp last night kept him from rehab transfer.   OBJECTIVE Vitals:   04/03/18 0015 04/03/18 0321 04/03/18 0414 04/03/18 0735  BP: (!) 160/67 (!) 156/80 (!) 154/91 (!) 166/76  Pulse: 67 66 62   Resp: 17 20 15    Temp: 98.5 F (36.9 C) 98.9 F (37.2 C) 98.7 F (37.1 C) (!) 97.4 F (36.3 C)  TempSrc: Oral Oral Oral Oral  SpO2: 99% 100% 98%   Weight:      Height:        CBC:  Recent Labs  Lab 03/30/18 1026  04/02/18 0400 04/03/18 0344  WBC 5.9   < > 9.6 8.0  NEUTROABS 3.2  --   --   --   HGB 13.9   < > 14.1 12.2*  HCT 45.0   < > 44.1 37.5*  MCV 91.1   < > 90.2 88.9  PLT 224   < > 202 160   < > = values in this interval not displayed.    Basic Metabolic Panel:  Recent Labs  Lab 04/02/18 0400 04/02/18 0951 04/03/18 0344  NA 142 141 140  K 3.9  --  3.5  CL 105  --  105  CO2 28  --  24  GLUCOSE 203*  --  206*  BUN 10  --  11  CREATININE 1.22  --  1.33*  CALCIUM 9.7  --  8.5*    Lipid Panel:     Component Value Date/Time   CHOL 109 03/31/2018 0223   TRIG 64 03/31/2018 0223   HDL 36 (L) 03/31/2018 0223   CHOLHDL 3.0 03/31/2018 0223   VLDL 13 03/31/2018 0223   LDLCALC 60 03/31/2018 0223   HgbA1c:  Lab Results  Component Value Date   HGBA1C 7.3 (H) 03/31/2018   Urine Drug Screen:     Component Value Date/Time   LABOPIA NONE DETECTED 03/31/2018 0927   COCAINSCRNUR NONE DETECTED 03/31/2018 0927   LABBENZ NONE DETECTED 03/31/2018 0927   AMPHETMU NONE DETECTED 03/31/2018 0927   THCU NONE DETECTED 03/31/2018 0927   LABBARB NONE DETECTED 03/31/2018 0927    Alcohol Level No results found for: ETH  IMAGING Ct Head Code Stroke Wo Contrast 03/30/2018 1. Remote left PCA territory nonhemorrhagic infarct  2. Moderate diffuse white matter disease bilaterally.  3. No acute  cortical abnormality.  4. ASPECTS is 10/10   Ct Angio Head W Or Wo Contrast Ct Angio Neck W Or Wo Contrast Ct Cerebral Perfusion W Contrast 03/30/2018  1. Large left MCA territory infarct with extremely poor collaterals.  2. Emergent large vessel occlusion of the proximal left M1 segment.  3. Moderate to high-grade segmental stenoses of the right M1 segment.  4. Moderate proximal A1 stenoses bilaterally.  5. High-grade stenosis and occlusion of the right vertebral artery at the dural margin and V4 segment.  6. High-grade stenosis of the left vertebral artery with small irregular basilar artery feeding the residual right PCA.  7. High-grade stenosis of distal right P2 segment.  8. 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   Mr Brain Wo Contrast 03/30/2018 1. Acute nonhemorrhagic large left MCA territory infarct.  2. Remote left PCA territory infarct.  3. Advanced white matter disease consistent with chronic microvascular ischemia.  4. Vascular  changes as above.   Mr Brain Wo Contrast 03/31/2018 1. Evolving large acute left MCA infarct with new cytotoxic edema and minimal petechial hemorrhage. No midline shift.  2. Large chronic left PCA infarct.  3. Severe chronic small vessel ischemic disease.  Coexistent multiple sclerosis is not excluded by imaging.   Transthoracic Echocardiogram  03/30/2018 - Left ventricle: The cavity size was normal. Wall thickness was increased in a pattern of moderate LVH. Systolic function was normal. The estimated ejection fraction was in the range of 60% to 65%. Wall motion was normal; there were no regional wall motion abnormalities. Doppler parameters are consistent with abnormal left ventricular relaxation (grade 1 diastolic dysfunction). The Ee&' ratio is between 8-15, suggesting indeterminate LV filling pressure. - Left atrium: The atrium was normal in size. - Atrial septum: No defect or  patent foramen ovale was identified. - Inferior vena cava: The vessel was normal in size. The respirophasic diameter changes were in the normal range (>= 50%), consistent with normal central venous pressure. Impressions:  LVEF 60-65%, moderate LVH, normal wall motion, grade 1 DD, indeterminate LV filling pressure, normal LA size, normal IVC.  TEE LVEF 55-60% Thrombus noted in the left atrial appendage No PFO by color flow Doppler or saline microcavitation study. Trivial MR and AR. Mild PR  LE venous dopplers Neg DVT bilaterally  PHYSICAL EXAM Pleasant middle-aged African-American male sitting up awake in bed. Attempting to communicate. Afebrile now, had TM 102 during the night. Head is nontraumatic. Neck is supple without bruit.    Cardiac exam no murmur or gallop, regular rate and rhythm. Lungs arelear to auscultation. Distal pulses are well felt.  Neurological Exam :  Awake and alert. still globally aphasic, not following commands, able to have "yah" sound to all questions. Blinks to threat on the left but not the right. Left gaze preference barely pass midline. Right lower facial weakness. Dense right hemiplegia with only trace withdrawal in the right lower extremity. Antigravity strength on the left side upper and lower extremity. Tone is diminished on the right and deep tendon reflexes are depressed on the right. No babinski. Sensation, coordination and gait not tested.   ASSESSMENT/PLAN [name] is a 67 y.o. male with history of previous stroke, hypertension, diabetes, hyperlipidemia who presents with right-sided weakness and aphasia. IV tPA 03/30/2018 at 11:59 AM  Stroke:  Large left MCA territory infarct - embolic pattern - unknown source, cardioembolic vs. Diffuse severe intracranial athero  Resultant  Global aphasia and right hemiplegia  CT head - Remote left PCA territory nonhemorrhagic infarct   MRI head - Acute nonhemorrhagic large left MCA territory infarct.  And old left PCA infarct  CTA H&N - left M1 cut off, high grade stenosis of right M1, right P2, b/l A1, and left VA. Chronic occlusion of right V4 and left P1  Repeat CT 04/02/18 - evolution of left large MCA infarct, trace MLS  2D Echo - EF 60 - 65%. No cardiac source of emboli identified.   TEE no PFO or SOE  LE venous doppler no DVT  Pt lives in [place], may not good candidate for loop recorder here  LDL - 60  HgbA1c - 7.3  UDS - negative  VTE prophylaxis - lovenox  clopidogrel 75 mg daily prior to admission, now on ASA 325 and plavix 75 x 3 mons given significant intracranial stenosis.   Therapy recommendations:  CIR  Disposition:  Pending - transfer to CIR delayed d/t fever  Hope for  d/c to CIR in am  Fever  One time temp 102.1   CXR ok  WBC normal 8.0  UA pending   Cerebral edema  CT and MRI showed large left MCA infarct with mild MLS  Was on 3% saline   Na 144->142->140  Continue IVF for now - increased to NS @ 75  Hx of stroke  2013 in [place]  CT showed old left PCA territory infarct  Residue right hemiparesis, hemianopia  Could walk with walker and need some help ADLs at baseline  Hypertension  BP 140-160s past 24h  Treated with cardene in the ICU . SBP goal < 180 mmHg . On norvasc 10, clonidine 0.2 bid and metoprolol 25 . Increased metoprolol to 50 today . Long-term BP goal 130-150 given left M1 cut off  Hyperlipidemia  Lipid lowering medication PTA:  Lipitor 20 mg daily  LDL 60, at goal < 70  Current lipid lowering medication: lipitor 20  Continue statin at discharge  Diabetes  A1C 7.3, goal < 7.0  Uncontrolled  Home meds - Novolin R 0-12 units TID, Glipizide 10 mg BID, Levemir 30 units QAM, 15 units QAM  Current meds - Novolog 0-15 units TID, Glipizide 5 mg BID  Glucoses 187-222  DB RN Coord rec restarting portion of home insulin : levemir 10 QD ordered  SSI  CBG monitoring  Other Stroke Risk Factors  Advanced  age  Other Active Problems  Elevated Creatinine -1.80->1.22-> 1.33, increased IVF  Hospital day # 4  Douglas MainSharon Biby, MSN, APRN, ANVP-BC, AGPCNP-BC Advanced Practice Stroke Nurse [facility] See Amion for Schedule & Pager information 04/03/2018 4:28 PM   ATTENDING NOTE: I reviewed above note and agree with the assessment and plan. Pt was seen and examined.   Pt daughter and niece are at bedside. Pt lying in bed, awake alert, able to say "yes" to all questions. Not following commands. Still has global aphasia and right hemiplegia. Had fever last night, but afebrile today. CXR no pneumonia and UA pending.  BP still on the high end, will resume hydralazine 50 mg 3 times daily.  Glucose still high, will increase Levemir to 15 units twice daily.  PT/OT recommend CIR.  Plan for transfer CIR tomorrow if no more fever.  Marvel PlanJindong Kedron Uno, MD PhD Stroke Neurology 04/03/2018 5:33 PM     To contact Stroke Continuity provider, please refer to [email or web address]. After hours, contact General Neurology

## 2018-04-04 ENCOUNTER — Inpatient Hospital Stay (HOSPITAL_COMMUNITY)
Admission: RE | Admit: 2018-04-04 | Discharge: 2018-05-04 | DRG: 057 | Disposition: A | Discharge status: Skilled Nursing Facility | Payer: Medicare Other | Source: Intra-hospital | Attending: Physical Medicine & Rehabilitation | Admitting: Physical Medicine & Rehabilitation

## 2018-04-04 ENCOUNTER — Other Ambulatory Visit: Payer: Self-pay

## 2018-04-04 ENCOUNTER — Encounter (HOSPITAL_COMMUNITY): Payer: Self-pay

## 2018-04-04 DIAGNOSIS — I639 Cerebral infarction, unspecified: Secondary | ICD-10-CM

## 2018-04-04 DIAGNOSIS — D62 Acute posthemorrhagic anemia: Secondary | ICD-10-CM

## 2018-04-04 DIAGNOSIS — I63512 Cerebral infarction due to unspecified occlusion or stenosis of left middle cerebral artery: Secondary | ICD-10-CM

## 2018-04-04 DIAGNOSIS — E1142 Type 2 diabetes mellitus with diabetic polyneuropathy: Secondary | ICD-10-CM | POA: Diagnosis present

## 2018-04-04 DIAGNOSIS — I69391 Dysphagia following cerebral infarction: Secondary | ICD-10-CM | POA: Diagnosis not present

## 2018-04-04 DIAGNOSIS — Z7902 Long term (current) use of antithrombotics/antiplatelets: Secondary | ICD-10-CM

## 2018-04-04 DIAGNOSIS — G8191 Hemiplegia, unspecified affecting right dominant side: Secondary | ICD-10-CM | POA: Diagnosis not present

## 2018-04-04 DIAGNOSIS — G936 Cerebral edema: Secondary | ICD-10-CM

## 2018-04-04 DIAGNOSIS — E785 Hyperlipidemia, unspecified: Secondary | ICD-10-CM | POA: Diagnosis present

## 2018-04-04 DIAGNOSIS — N3090 Cystitis, unspecified without hematuria: Secondary | ICD-10-CM | POA: Diagnosis not present

## 2018-04-04 DIAGNOSIS — I69351 Hemiplegia and hemiparesis following cerebral infarction affecting right dominant side: Secondary | ICD-10-CM | POA: Diagnosis present

## 2018-04-04 DIAGNOSIS — N289 Disorder of kidney and ureter, unspecified: Secondary | ICD-10-CM

## 2018-04-04 DIAGNOSIS — I69320 Aphasia following cerebral infarction: Secondary | ICD-10-CM | POA: Diagnosis not present

## 2018-04-04 DIAGNOSIS — Z79899 Other long term (current) drug therapy: Secondary | ICD-10-CM | POA: Diagnosis not present

## 2018-04-04 DIAGNOSIS — R7309 Other abnormal glucose: Secondary | ICD-10-CM | POA: Diagnosis not present

## 2018-04-04 DIAGNOSIS — N390 Urinary tract infection, site not specified: Secondary | ICD-10-CM | POA: Diagnosis present

## 2018-04-04 DIAGNOSIS — E139 Other specified diabetes mellitus without complications: Secondary | ICD-10-CM

## 2018-04-04 DIAGNOSIS — R1311 Dysphagia, oral phase: Secondary | ICD-10-CM | POA: Diagnosis present

## 2018-04-04 DIAGNOSIS — R4701 Aphasia: Secondary | ICD-10-CM | POA: Diagnosis not present

## 2018-04-04 DIAGNOSIS — E669 Obesity, unspecified: Secondary | ICD-10-CM | POA: Diagnosis not present

## 2018-04-04 DIAGNOSIS — I10 Essential (primary) hypertension: Secondary | ICD-10-CM | POA: Diagnosis present

## 2018-04-04 DIAGNOSIS — R0989 Other specified symptoms and signs involving the circulatory and respiratory systems: Secondary | ICD-10-CM

## 2018-04-04 DIAGNOSIS — E1169 Type 2 diabetes mellitus with other specified complication: Secondary | ICD-10-CM | POA: Diagnosis not present

## 2018-04-04 DIAGNOSIS — R1313 Dysphagia, pharyngeal phase: Secondary | ICD-10-CM | POA: Diagnosis present

## 2018-04-04 DIAGNOSIS — Z794 Long term (current) use of insulin: Secondary | ICD-10-CM | POA: Diagnosis not present

## 2018-04-04 HISTORY — DX: Disorder of kidney and ureter, unspecified: N28.9

## 2018-04-04 HISTORY — DX: Cerebral infarction due to unspecified occlusion or stenosis of left middle cerebral artery: I63.512

## 2018-04-04 HISTORY — DX: Cerebral edema: G93.6

## 2018-04-04 LAB — CBC
HCT: 38 % — ABNORMAL LOW (ref 39.0–52.0)
HCT: 37.8 % — ABNORMAL LOW (ref 39.0–52.0)
Hemoglobin: 12.6 g/dL — ABNORMAL LOW (ref 13.0–17.0)
Hemoglobin: 12.1 g/dL — ABNORMAL LOW (ref 13.0–17.0)
MCH: 28.8 pg (ref 26.0–34.0)
MCH: 28.1 pg (ref 26.0–34.0)
MCHC: 33.2 g/dL (ref 30.0–36.0)
MCHC: 32 g/dL (ref 30.0–36.0)
MCV: 87 fL (ref 80.0–100.0)
MCV: 87.7 fL (ref 80.0–100.0)
NRBC: 0 % (ref 0.0–0.2)
PLATELETS: 174 10*3/uL (ref 150–400)
Platelets: 176 10*3/uL (ref 150–400)
RBC: 4.37 MIL/uL (ref 4.22–5.81)
RBC: 4.31 MIL/uL (ref 4.22–5.81)
RDW: 11.9 % (ref 11.5–15.5)
RDW: 11.9 % (ref 11.5–15.5)
WBC: 7.2 10*3/uL (ref 4.0–10.5)
WBC: 6.7 10*3/uL (ref 4.0–10.5)
nRBC: 0 % (ref 0.0–0.2)

## 2018-04-04 LAB — GLUCOSE, CAPILLARY
GLUCOSE-CAPILLARY: 195 mg/dL — AB (ref 70–99)
Glucose-Capillary: 239 mg/dL — ABNORMAL HIGH (ref 70–99)
Glucose-Capillary: 255 mg/dL — ABNORMAL HIGH (ref 70–99)
Glucose-Capillary: 177 mg/dL — ABNORMAL HIGH (ref 70–99)

## 2018-04-04 LAB — BASIC METABOLIC PANEL
ANION GAP: 11 (ref 5–15)
BUN: 11 mg/dL (ref 8–23)
CO2: 27 mmol/L (ref 22–32)
Calcium: 8.9 mg/dL (ref 8.9–10.3)
Chloride: 99 mmol/L (ref 98–111)
Creatinine, Ser: 1.19 mg/dL (ref 0.61–1.24)
GFR calc Af Amer: >60 mL/min (ref >60)
Glucose, Bld: 221 mg/dL — ABNORMAL HIGH (ref 70–99)
Potassium: 3.6 mmol/L (ref 3.5–5.1)
Sodium: 137 mmol/L (ref 135–145)

## 2018-04-04 LAB — CREATININE, SERUM
Creatinine, Ser: 1.28 mg/dL — ABNORMAL HIGH (ref 0.61–1.24)
GFR calc Af Amer: >60 mL/min (ref >60)
GFR calc non Af Amer: 58 mL/min — ABNORMAL LOW (ref >60)

## 2018-04-04 MED ORDER — ASPIRIN 325 MG PO TBEC: 325.0000 mg | DELAYED_RELEASE_TABLET | Freq: Every day | ORAL | 0 refills | Status: DC

## 2018-04-04 MED ORDER — ORAL CARE MOUTH RINSE
15.0000 mL | Freq: Two times a day (BID) | OROMUCOSAL | Status: DC
  Administered 2018-04-04 – 2018-05-04 (×52): 15 mL via OROMUCOSAL

## 2018-04-04 MED ORDER — INSULIN ASPART 100 UNIT/ML ~~LOC~~ SOLN
0.0000 [IU] | Freq: Three times a day (TID) | SUBCUTANEOUS | Status: DC
  Administered 2018-04-04: 8 [IU] via SUBCUTANEOUS
  Administered 2018-04-05: 3 [IU] via SUBCUTANEOUS
  Administered 2018-04-05 (×2): 8 [IU] via SUBCUTANEOUS
  Administered 2018-04-06: 5 [IU] via SUBCUTANEOUS
  Administered 2018-04-06 – 2018-04-07 (×3): 3 [IU] via SUBCUTANEOUS
  Administered 2018-04-07: 2 [IU] via SUBCUTANEOUS
  Administered 2018-04-07: 3 [IU] via SUBCUTANEOUS
  Administered 2018-04-08: 5 [IU] via SUBCUTANEOUS
  Administered 2018-04-08 – 2018-04-09 (×3): 3 [IU] via SUBCUTANEOUS
  Administered 2018-04-09: 5 [IU] via SUBCUTANEOUS
  Administered 2018-04-10 (×2): 3 [IU] via SUBCUTANEOUS
  Administered 2018-04-10: 5 [IU] via SUBCUTANEOUS
  Administered 2018-04-11: 3 [IU] via SUBCUTANEOUS
  Administered 2018-04-11: 5 [IU] via SUBCUTANEOUS
  Administered 2018-04-11: 2 [IU] via SUBCUTANEOUS
  Administered 2018-04-12 (×2): 5 [IU] via SUBCUTANEOUS
  Administered 2018-04-12: 2 [IU] via SUBCUTANEOUS
  Administered 2018-04-13: 5 [IU] via SUBCUTANEOUS
  Administered 2018-04-13: 2 [IU] via SUBCUTANEOUS
  Administered 2018-04-13 – 2018-04-14 (×3): 3 [IU] via SUBCUTANEOUS
  Administered 2018-04-14: 2 [IU] via SUBCUTANEOUS
  Administered 2018-04-15: 5 [IU] via SUBCUTANEOUS
  Administered 2018-04-15: 2 [IU] via SUBCUTANEOUS
  Administered 2018-04-16: 11 [IU] via SUBCUTANEOUS
  Administered 2018-04-16: 2 [IU] via SUBCUTANEOUS
  Administered 2018-04-16 – 2018-04-17 (×2): 3 [IU] via SUBCUTANEOUS
  Administered 2018-04-17: 2 [IU] via SUBCUTANEOUS
  Administered 2018-04-17: 3 [IU] via SUBCUTANEOUS
  Administered 2018-04-18 (×2): 5 [IU] via SUBCUTANEOUS
  Administered 2018-04-18: 2 [IU] via SUBCUTANEOUS
  Administered 2018-04-19: 5 [IU] via SUBCUTANEOUS
  Administered 2018-04-19: 3 [IU] via SUBCUTANEOUS
  Administered 2018-04-20: 5 [IU] via SUBCUTANEOUS
  Administered 2018-04-20: 3 [IU] via SUBCUTANEOUS
  Administered 2018-04-20 – 2018-04-21 (×3): 5 [IU] via SUBCUTANEOUS
  Administered 2018-04-21 – 2018-04-22 (×2): 3 [IU] via SUBCUTANEOUS
  Administered 2018-04-22: 5 [IU] via SUBCUTANEOUS
  Administered 2018-04-23: 3 [IU] via SUBCUTANEOUS
  Administered 2018-04-23: 2 [IU] via SUBCUTANEOUS
  Administered 2018-04-23: 3 [IU] via SUBCUTANEOUS
  Administered 2018-04-24 (×2): 5 [IU] via SUBCUTANEOUS
  Administered 2018-04-24: 2 [IU] via SUBCUTANEOUS
  Administered 2018-04-25: 3 [IU] via SUBCUTANEOUS
  Administered 2018-04-25: 5 [IU] via SUBCUTANEOUS
  Administered 2018-04-26 (×2): 3 [IU] via SUBCUTANEOUS
  Administered 2018-04-27: 8 [IU] via SUBCUTANEOUS
  Administered 2018-04-27: 5 [IU] via SUBCUTANEOUS
  Administered 2018-04-27: 2 [IU] via SUBCUTANEOUS
  Administered 2018-04-28 (×2): 3 [IU] via SUBCUTANEOUS
  Administered 2018-04-28 – 2018-04-29 (×2): 5 [IU] via SUBCUTANEOUS
  Administered 2018-04-29 – 2018-04-30 (×4): 3 [IU] via SUBCUTANEOUS
  Administered 2018-04-30: 5 [IU] via SUBCUTANEOUS
  Administered 2018-05-01 (×3): 3 [IU] via SUBCUTANEOUS
  Administered 2018-05-02: 2 [IU] via SUBCUTANEOUS
  Administered 2018-05-02 (×2): 5 [IU] via SUBCUTANEOUS
  Administered 2018-05-03 (×3): 3 [IU] via SUBCUTANEOUS
  Administered 2018-05-04 (×3): 2 [IU] via SUBCUTANEOUS

## 2018-04-04 MED ORDER — ENOXAPARIN SODIUM 40 MG/0.4ML ~~LOC~~ SOLN: 40.0000 mg | SUBCUTANEOUS | Status: DC

## 2018-04-04 MED ORDER — HYDRALAZINE HCL 50 MG PO TABS: 50.0000 mg | ORAL_TABLET | Freq: Three times a day (TID) | ORAL | Status: DC

## 2018-04-04 MED ORDER — INSULIN ASPART 100 UNIT/ML ~~LOC~~ SOLN: 0.0000 [IU] | Freq: Three times a day (TID) | SUBCUTANEOUS | 11 refills | Status: DC

## 2018-04-04 MED ORDER — RESOURCE THICKENUP CLEAR PO POWD
ORAL | Status: DC | PRN
  Filled 2018-04-04: qty 125

## 2018-04-04 MED ORDER — CLOPIDOGREL BISULFATE 75 MG PO TABS
75.0000 mg | ORAL_TABLET | Freq: Every day | ORAL | Status: DC
  Administered 2018-04-05 – 2018-05-04 (×30): 75 mg via ORAL
  Filled 2018-04-04 (×30): qty 1

## 2018-04-04 MED ORDER — INSULIN DETEMIR 100 UNIT/ML ~~LOC~~ SOLN: 15.0000 [IU] | Freq: Two times a day (BID) | SUBCUTANEOUS | 11 refills | Status: DC

## 2018-04-04 MED ORDER — INSULIN DETEMIR 100 UNIT/ML ~~LOC~~ SOLN
15.0000 [IU] | Freq: Two times a day (BID) | SUBCUTANEOUS | Status: DC
  Administered 2018-04-04 – 2018-04-06 (×4): 15 [IU] via SUBCUTANEOUS
  Filled 2018-04-04 (×4): qty 0.15

## 2018-04-04 MED ORDER — RESOURCE THICKENUP CLEAR PO POWD: 1.0000 | ORAL | Status: DC | PRN

## 2018-04-04 MED ORDER — ACETAMINOPHEN 650 MG RE SUPP: 650.0000 mg | RECTAL | Status: DC | PRN

## 2018-04-04 MED ORDER — ACETAMINOPHEN 325 MG PO TABS
650.0000 mg | ORAL_TABLET | ORAL | Status: DC | PRN
  Administered 2018-04-05 – 2018-04-06 (×2): 650 mg via ORAL
  Filled 2018-04-04 (×2): qty 2

## 2018-04-04 MED ORDER — ASPIRIN EC 325 MG PO TBEC
325.0000 mg | DELAYED_RELEASE_TABLET | Freq: Every day | ORAL | Status: DC
  Administered 2018-04-05 – 2018-05-04 (×30): 325 mg via ORAL
  Filled 2018-04-04 (×30): qty 1

## 2018-04-04 MED ORDER — CLONIDINE HCL 0.2 MG PO TABS
0.2000 mg | ORAL_TABLET | Freq: Two times a day (BID) | ORAL | Status: DC
  Administered 2018-04-04 – 2018-05-04 (×60): 0.2 mg via ORAL
  Filled 2018-04-04 (×63): qty 1

## 2018-04-04 MED ORDER — HYDRALAZINE HCL 50 MG PO TABS
50.0000 mg | ORAL_TABLET | Freq: Three times a day (TID) | ORAL | Status: DC
  Administered 2018-04-04 – 2018-05-04 (×89): 50 mg via ORAL
  Filled 2018-04-04 (×91): qty 1

## 2018-04-04 MED ORDER — PANTOPRAZOLE SODIUM 40 MG PO TBEC
40.0000 mg | DELAYED_RELEASE_TABLET | Freq: Every day | ORAL | Status: DC
  Administered 2018-04-05 – 2018-05-04 (×30): 40 mg via ORAL
  Filled 2018-04-04 (×30): qty 1

## 2018-04-04 MED ORDER — METOPROLOL SUCCINATE ER 50 MG PO TB24
50.0000 mg | ORAL_TABLET | Freq: Every day | ORAL | Status: DC
  Administered 2018-04-05 – 2018-05-04 (×30): 50 mg via ORAL
  Filled 2018-04-04 (×30): qty 1

## 2018-04-04 MED ORDER — AMOXICILLIN 250 MG PO CAPS
250.0000 mg | ORAL_CAPSULE | Freq: Three times a day (TID) | ORAL | Status: DC
  Administered 2018-04-04 – 2018-04-05 (×3): 250 mg via ORAL
  Filled 2018-04-04 (×3): qty 1

## 2018-04-04 MED ORDER — ATORVASTATIN CALCIUM 10 MG PO TABS
20.0000 mg | ORAL_TABLET | Freq: Every day | ORAL | Status: DC
  Administered 2018-04-04 – 2018-05-03 (×30): 20 mg via ORAL
  Filled 2018-04-04 (×32): qty 2

## 2018-04-04 MED ORDER — ACETAMINOPHEN 160 MG/5ML PO SOLN: 650.0000 mg | ORAL | Status: DC | PRN

## 2018-04-04 MED ORDER — BUSPIRONE HCL 5 MG PO TABS
7.5000 mg | ORAL_TABLET | Freq: Two times a day (BID) | ORAL | Status: DC
  Administered 2018-04-04 – 2018-04-11 (×14): 7.5 mg via ORAL
  Filled 2018-04-04 (×14): qty 2

## 2018-04-04 MED ORDER — ENOXAPARIN SODIUM 40 MG/0.4ML ~~LOC~~ SOLN
40.0000 mg | SUBCUTANEOUS | Status: DC
  Administered 2018-04-05 – 2018-05-04 (×30): 40 mg via SUBCUTANEOUS
  Filled 2018-04-04 (×31): qty 0.4

## 2018-04-04 MED ORDER — GLIPIZIDE 5 MG PO TABS
5.0000 mg | ORAL_TABLET | Freq: Two times a day (BID) | ORAL | Status: DC
  Administered 2018-04-04 – 2018-04-12 (×16): 5 mg via ORAL
  Filled 2018-04-04 (×16): qty 1

## 2018-04-04 MED ORDER — LIVING WELL WITH DIABETES BOOK
Freq: Once | Status: AC
  Administered 2018-04-04: 19:00:00
  Filled 2018-04-04: qty 1

## 2018-04-04 MED ORDER — SODIUM CHLORIDE 0.9 % IV SOLN: 75.0000 mL | INTRAVENOUS | 0 refills | Status: DC

## 2018-04-04 MED ORDER — METOPROLOL SUCCINATE ER 50 MG PO TB24: 50.0000 mg | ORAL_TABLET | Freq: Every day | ORAL | Status: DC

## 2018-04-04 MED ORDER — AMLODIPINE BESYLATE 10 MG PO TABS
10.0000 mg | ORAL_TABLET | Freq: Every day | ORAL | Status: DC
  Administered 2018-04-05 – 2018-05-04 (×30): 10 mg via ORAL
  Filled 2018-04-04 (×30): qty 1

## 2018-04-04 MED ORDER — BLOOD PRESSURE CONTROL BOOK
Freq: Once | Status: AC
  Administered 2018-04-04: 19:00:00
  Filled 2018-04-04: qty 1

## 2018-04-04 MED ORDER — SORBITOL 70 % SOLN: 30.0000 mL | Freq: Every day | Status: DC | PRN

## 2018-04-04 NOTE — Care Management Important Message (Signed)
Important Message  Patient Details  Name: Douglas [name] [name] MRN: [id number] Date of Birth: 04/15/1951   Medicare Important Message Given:  Yes    Quindarrius Joplin 04/04/2018, 11:07 AM

## 2018-04-04 NOTE — PMR Pre-admission (Signed)
PMR Admission Coordinator Pre-Admission Assessment  Patient: Douglas [name] is an 67 y.o., male MRN: [id number] DOB: April 04, 1951 Height: 6\' 2"  (188 cm) Weight: 90.4 kg              Insurance Information HMO:     PPO:      PCP:      IPA:      80/20:      OTHER: no HMO PRIMARY: Medicare a and b      Policy#: [id number]      Subscriber: pt Patient from [place]/Daughter to obtain new Medicare number and provide to staff Benefits:  Phone #: passport one online     Name: 04/04/2018 Eff. Date: 12/31/2013     Deduct: $1364      Out of Pocket Max: none      Life Max: none CIR: 100%      SNF: 20 full days Outpatient: 80%     Co-Pay: 20% Home Health: 100%      Co-Pay: none DME: 80%     Co-Pay: 20% Providers: pt choice  SECONDARY: C[employer]      Policy#: [id number]      Subscriber: pt group # [id number] I emailed to [employer] at pre service center on 04/04/18 the Cign[employer] information to be added to the system  Medicaid Application Date:       Case Manager:  Disability Application Date:       Case Worker:   Emergency Conservator, museum/gallery Information    Name Relation Home Work Mobile   Macalister, Arnaud Daughter   [phone]     Current Medical History  Patient Admitting Diagnosis: Left MCA infarct  History of Present Illness:Douglas [name] is a 67 year old right-handed male with history of hypertension, diabetes mellitus, hyperlipidemia and 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 . Presented 03/30/2018 with increasing speech difficulty and limited mobility. Cranial CT scan showed remote left PCA territory nonhemorrhagic infarction. CT angiogram of head and neck showed large left MCA territory infarction with extremely poor collaterals. Emergent large vessel occlusion proximal left M1 segment. Echocardiogram with ejection fraction of 65% without emboli. Follow-up MRI reviewed showing large MCA infarction. Dysphagia #2 nectar thick liquid diet.TEE completed  04/02/2018 ejection fraction 65% thrombus noted in the left atrial appendage no PFO. Patient currently maintained on aspirin and Plavix for CVA prophylaxis. Subcutaneous Lovenox for DVT prophylaxis. Patient spiked a low-grade fever 04/03/2018 with chest x-ray negative and urinalysis positive nitrite.Cultures and sensitivities pending.   Complete NIHSS TOTAL: 15    Past Medical History  Past Medical History:  Diagnosis Date  . Diabetes mellitus without complication (HCC)   . Hypertension   . Stroke [facility])     Family History  family history is not on file.  Prior Rehab/Hospitalizations:  Has the patient had major surgery during 100 days prior to admission? No After first CVA in [place], patient received inpatient rehab services for a month and went home with daughter, [name]. Eventually moved out to this "ALF/ILF" where he was Mod I on RW.  Current Medications   Current Facility-Administered Medications:  .  0.9 %  sodium chloride infusion, , Intravenous, Continuous, [name], MD, Last Rate: 75 mL/hr at 04/04/18 0400 .  acetaminophen (TYLENOL) tablet 650 mg, 650 mg, Oral, Q4H PRN, 650 mg at 04/02/18 1948 **OR** acetaminophen (TYLENOL) solution 650 mg, 650 mg, Per Tube, Q4H PRN **OR** acetaminophen (TYLENOL) suppository 650 mg, 650 mg, Rectal, Q4H PRN, [name], MD, [phone]  mg at 04/01/18 [id number] .  amLODipine (NORVASC) tablet 10 mg, 10 mg, Oral, Daily, Chilton Siandolph, Tiffany, MD, 10 mg at 04/03/18 0910 .  aspirin EC tablet 325 mg, 325 mg, Oral, Daily, Chilton Siandolph, Tiffany, MD, 325 mg at 04/03/18 0910 .  atorvastatin (LIPITOR) tablet 20 mg, 20 mg, Oral, QHS, Chilton Siandolph, Tiffany, MD, 20 mg at 04/03/18 2241 .  cloNIDine (CATAPRES) tablet 0.2 mg, 0.2 mg, Oral, BID, Chilton Siandolph, Tiffany, MD, 0.2 mg at 04/03/18 2240 .  clopidogrel (PLAVIX) tablet 75 mg, 75 mg, Oral, Daily, Chilton Siandolph, Tiffany, MD, 75 mg at 04/03/18 0910 .  enoxaparin (LOVENOX) injection 40 mg, 40 mg, Subcutaneous, Q24H, Chilton Siandolph, Tiffany,  MD, 40 mg at 04/03/18 0950 .  glipiZIDE (GLUCOTROL) tablet 5 mg, 5 mg, Oral, BID AC, Chilton Siandolph, Tiffany, MD, 5 mg at 04/03/18 1642 .  hydrALAZINE (APRESOLINE) tablet 50 mg, 50 mg, Oral, TID, Marvel PlanXu, Jindong, MD, 50 mg at 04/03/18 2241 .  insulin aspart (novoLOG) injection 0-15 Units, 0-15 Units, Subcutaneous, TID WC, Chilton Siandolph, Tiffany, MD, 8 Units at 04/03/18 1653 .  insulin detemir (LEVEMIR) injection 15 Units, 15 Units, Subcutaneous, BID, Marvel PlanXu, Jindong, MD .  labetalol (NORMODYNE,TRANDATE) injection 20 mg, 20 mg, Intravenous, Q2H PRN, Chilton Siandolph, Tiffany, MD .  MEDLINE mouth rinse, 15 mL, Mouth Rinse, BID, Chilton Siandolph, Tiffany, MD, 15 mL at 04/03/18 2254 .  metoprolol succinate (TOPROL-XL) 24 hr tablet 50 mg, 50 mg, Oral, Daily, Marvel PlanXu, Jindong, MD, 50 mg at 04/03/18 0951 .  pantoprazole (PROTONIX) EC tablet 40 mg, 40 mg, Oral, Daily, Chilton Siandolph, Tiffany, MD, 40 mg at 04/03/18 0911 .  RESOURCE THICKENUP CLEAR, , Oral, PRN, Chilton Siandolph, Tiffany, MD  Patients Current Diet:  Diet Order            DIET DYS 2 Room service appropriate? Yes; Fluid consistency: Nectar Thick  Diet effective now              Precautions / Restrictions Precautions Precautions: Fall Precaution Comments: Right hemi Restrictions Weight Bearing Restrictions: No   Has the patient had 2 or more falls or a fall with injury in the past year?No  Prior Activity Level Limited Community (1-2x/wk): Pateitn was independnet with RW; lived in ILF(Daughter states a duplex that was turned into a "ALF")   Patient Mod I on RW and did own adls. Lives in a duplex which is an ALF/ILF. He is provided meals and his meds, but otherwise Mod I with all adls.   Home Assistive Devices / Equipment Home Assistive Devices/Equipment: CBG Meter, Environmental consultantWalker (specify type) Home Equipment: Walker - 2 wheels  Prior Device Use: Indicate devices/aids used by the patient prior to current illness, exacerbation or injury? Walker  Prior Functional Level Prior  Function Level of Independence: Independent with assistive device(s)(RW) Comments: per family, pt uses RW to ambulate independently, does all his basic ADL;s,  the facility does meds and meals.  Self Care: Did the patient need help bathing, dressing, using the toilet or eating?  Independent  Indoor Mobility: Did the patient need assistance with walking from room to room (with or without device)? Independent  Stairs: Did the patient need assistance with internal or external stairs (with or without device)? Needed some help  Functional Cognition: Did the patient need help planning regular tasks such as shopping or remembering to take medications? Needed some help  Current Functional Level Cognition  Arousal/Alertness: Awake/alert Overall Cognitive Status: Impaired/Different from baseline Current Attention Level: Focused, Sustained Orientation Level: Other (comment) Following Commands: Follows one step commands inconsistently, Follows one  step commands with increased time Safety/Judgement: Decreased awareness of safety, Decreased awareness of deficits General Comments: Pt requiring increased time and cues throughout ADLs.  Attention: Sustained Sustained Attention: Impaired Sustained Attention Impairment: Verbal basic, Functional basic Memory: (DTA) Awareness: (DTA) Behaviors: Impulsive Safety/Judgment: (DTA)    Extremity Assessment (includes Sensation/Coordination)  Upper Extremity Assessment: RUE deficits/detail RUE Deficits / Details: Increased tone and noted flexion. No active movement RUE Coordination: decreased fine motor, decreased gross motor  Lower Extremity Assessment: Defer to PT evaluation RLE Deficits / Details: moves spontaneously in synergy.  pt bore some weight in standing, but unable to fully extend his knee. RLE Coordination: decreased fine motor LLE Deficits / Details: WFL    ADLs  Overall ADL's : Needs assistance/impaired Grooming: Minimal assistance, Oral  care, Bed level Grooming Details (indicate cue type and reason): Min A to manage swab and drip into cup.  Max A for holding cup in R hand. Pt able to bring swab to mouth and perform brushing motion but requires Max cues to dip swab into cup. Upper Body Bathing: Moderate assistance, Bed level Lower Body Bathing: Maximal assistance, Bed level Upper Body Dressing : Moderate assistance, Bed level Lower Body Dressing: Maximal assistance, Bed level Lower Body Dressing Details (indicate cue type and reason): Max A for donning socks Toilet Transfer: Maximal assistance, +2 for physical assistance, BSC(stedy) Toilet Transfer Details (indicate cue type and reason): Max A +2 to perform sit<>Stand with stedy and maintain standing balance. Pt with increased strength at left side and tendency for pushing to the right. Requiring Max cues for postural corrections.  Toileting- Clothing Manipulation and Hygiene: Maximal assistance, +2 for physical assistance Toileting - Clothing Manipulation Details (indicate cue type and reason): Max A +2 for sit<>stand and then to perform toilet hygiene. Pt requiring Max A to maintain standing balance.  Functional mobility during ADLs: Maximal assistance, +2 for physical assistance(stedy) General ADL Comments: Pt performing sit<>stand with stedy and requiring Max A +2. Pt requiring Max A +2 for toilet hygiene.     Mobility  Overal bed mobility: Needs Assistance Bed Mobility: Rolling, Sidelying to Sit Rolling: Mod assist, +2 for safety/equipment Sidelying to sit: Mod assist, +2 for safety/equipment Supine to sit: Mod assist, +2 for safety/equipment Sit to supine: Mod assist, +2 for safety/equipment General bed mobility comments: Mod A +2 for rolling to left and then pushing up into sitting    Transfers  Overall transfer level: Needs assistance Transfer via Lift Equipment: Stedy Transfers: Sit to/from Stand Sit to Stand: Max assist, +2 physical assistance General transfer  comment: Max A +2 to power up into standing and then gain balance. Pt with significant pushing to left     Ambulation / Gait / Stairs / Wheelchair Mobility  Ambulation/Gait General Gait Details: NT    Posture / Balance Balance Overall balance assessment: Needs assistance Sitting-balance support: No upper extremity supported, Single extremity supported Sitting balance-Leahy Scale: Fair Standing balance support: During functional activity, Bilateral upper extremity supported Standing balance-Leahy Scale: Poor Standing balance comment: Like in sitting, pt pushes fairly heavily to the R standing in the STEDY.  He is not able to coordinate holding with the R UE.  We will have to use the STEDY sparingly at present    Special needs/care consideration BiPAP/CPAP n/a CPM n/a Continuous Drip IV IVF at 75 cc/hr NS Dialysis n/a Life Vest n/a Oxygen n/a Special Bed seizure pads on bed and floor Trach Size n/a Wound Vac  N/a Skin intact Bowel mgmt:  LBM 12/2; incontinent Bladder mgmt: external catheter Diabetic mgmt Hgb A1c 7.3    Previous Home Environment Living Arrangements: Other (Comment)(lives in an "ALF" daughter describes a duplex turned into an)  Lives With: Other (Comment) Available Help at Discharge: Family, Available 24 hours/day(daughter to provide 24/7 min assist in her home) Type of Home: Other(Comment) Home Layout: One level Home Care Services: No Additional Comments: a duplex turned into an "ALF" per daughter. He will not return there   Patient was living in ALF/ILF in [place]. Duplex turned into a facility. Daughter was planning to move patient into a new apartment with her the first of the year even pta.  Discharge Living Setting Plans for Discharge Living Setting: Lives with (comment)(Patietn to d/c home with daughter to her apartment) Type of Home at Discharge: Apartment Discharge Home Layout: One level Discharge Home Access: Level entry Discharge Bathroom  Shower/Tub: Tub/shower unit, Curtain Discharge Bathroom Toilet: Standard Discharge Bathroom Accessibility: Yes How Accessible: Accessible via wheelchair, Accessible via walker(daughter to check ability via wheelchair) Does the patient have any problems obtaining your medications?: No   Daughter currently lives in 3 bedroom apartment with her Mom ( patient and Mom divorced) and her sister who has three children. [name] plans for patient to d/c home with her into this apartment  Until over the next 2 months she plans to move herself and her Dad into another apartment which was already planned before this admission. [name]'s sister is moving to [place] Estate agent family ) and [name]'s Mom... Undecided. Patient has 5 children. [name] is 43 years old and can work remotely from home. She plans to get her Dad settled here in CIR for a few days and then to fly home to [place] to make preparations for his discharge. She plans to drive back as to have transportation to drive him back to [place]. I have advised her to verify if driving or flying would be best option for his discharge to [place] before bring her car to [facility] for transport. [name] has requested her siblings to help her for she is overwhelmed at this time with the responsibility.  Social/Family/Support Systems Patient Roles: Parent(divorced; has five children) Contact Information: daughter, [name] Anticipated Caregiver: daughter, [name] Anticipated Caregiver's Contact Information: [phone] Ability/Limitations of Caregiver: [name] works but can work remotely from home Caregiver Availability: 24/7 Discharge Plan Discussed with Primary Caregiver: Yes Is Caregiver In Agreement with Plan?: Yes Does Caregiver/Family have Issues with Lodging/Transportation while Pt is in Rehab?: No  Goals/Additional Needs Patient/Family Goal for Rehab: Min PT and OT, min to mod SLP Expected length of stay: ELOS 14 to 20 days Special Service Needs: Patietn and daughter form [place],  visitng family in [place] over the Thanksgiving Holiday Pt/Family Agrees to Admission and willing to participate: Yes Program Orientation Provided & Reviewed with Pt/Caregiver Including Roles  & Responsibilities: Yes  Barriers to Discharge: (Visiting from [place])  Decrease burden of Care through IP rehab admission: n/a  Possible need for SNF placement upon discharge: [name] wants to avoid SNF locally and/or in [place].   Patient Condition: This patient's medical and functional status has changed since the consult dated 04/02/2018 in which the Rehabilitation Physician determined and documented that the patient was potentially appropriate for intensive rehabilitative care in an inpatient rehabilitation facility. Issues have been addressed and update has been discussed with Dr. Allena Katz and patient now appropriate for inpatient rehabilitation. Will admit to inpatient rehab today.   Preadmission Screen Completed By:  Clois Dupes, 04/04/2018 11:13  AM ______________________________________________________________________   Discussed status with Dr. Allena Katz on 04/04/2018 at  1127 and received telephone approval for admission today.  Admission Coordinator:  Clois Dupes, time 1610 Date 04/04/2018

## 2018-04-04 NOTE — Progress Notes (Signed)
Inpatient Rehabilitation Admissions Coordinator  I met at bedside with patient and his daughter. Daughter in agreement to admit to inpt rehab today. I have notified Burnetta Sabin, GNP, RN CM and SW. I will make the arrangements to admit today.  Danne Baxter, RN, MSN Rehab Admissions Coordinator [phone] 04/04/2018 10:41 AM

## 2018-04-04 NOTE — H&P (Addendum)
Physical Medicine and Rehabilitation Admission H&P    Chief Complaint  Patient presents with  . 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 Per chart review and daughter, patient lives alone independent with assistive device prior to admission. Patient lives in [place] and was visiting family in the area. Presented 03/30/2018 with increasing speech difficulty and limited mobility. Cranial CT scan showed remote left PCA territory nonhemorrhagic infarction. CT angiogram of head and neck showed large left MCA territory infarction with extremely poor collaterals. Emergent large vessel occlusion proximal left M1 segment.Patient was initially maintained on 3% saline for a short time. Echocardiogram with ejection fraction of 65% without emboli. Follow-up MRI reviewed showing large MCA infarction. Dysphagia #2 nectar thick liquid diet.TEE completed 04/02/2018 ejection fraction 65% thrombus noted in the left atrial appendage no PFO. Patient currently maintained on aspirin and Plavix for CVA prophylaxis. Subcutaneous Lovenox for DVT prophylaxis. Patient spiked a low-grade fever 04/03/2018 with chest x-ray negative and urinalysis positive nitrite.Cultures and sensitivities pending. Therapy evaluations completed with recommendations of physical medicine rehabilitation consult. Patient was admitted for a comprehensive rehabilitation program.  Review of Systems  Unable to perform ROS: Language   Past Medical History:  Diagnosis Date  . Diabetes mellitus without complication (HCC)   . Hypertension   . Stroke [facility])    Past Surgical History:  Procedure Laterality Date  . IR PATIENT EVAL TECH 0-60 MINS  03/30/2018  . TEE WITHOUT CARDIOVERSION N/A 04/02/2018   Procedure: TRANSESOPHAGEAL ECHOCARDIOGRAM (TEE);  Surgeon: [name],  MD;  Location: [facility] ENDOSCOPY;  Service: Cardiovascular;  Laterality: N/A;   History reviewed. No pertinent family history.,  Unable to obtain from patient. Social History:  reports that he has never smoked. He has never used smokeless tobacco. He reports that he drank alcohol. He reports that he does not use drugs. Allergies: No Known Allergies Medications Prior to Admission  Medication Sig Dispense Refill  . amLODipine (NORVASC) 10 MG tablet Take 10 mg by mouth daily.  3  . atorvastatin (LIPITOR) 20 MG tablet Take 20 mg by mouth at bedtime.  3  . busPIRone (BUSPAR) 5 MG tablet Take 7.5 mg by mouth 2 (two) times daily.  3  . cloNIDine (CATAPRES) 0.2 MG tablet Take 0.2 mg by mouth 2 (two) times daily.  6  . diclofenac (FLECTOR) 1.3 % PTCH Place 1 patch onto the skin every 12 (twelve) hours.  3  . glipiZIDE (GLUCOTROL) 10 MG tablet Take 10 mg by mouth 2 (two) times daily.  3  . hydrALAZINE (APRESOLINE) 100 MG tablet Take 100 mg by mouth 3 (three) times daily.    Marland Kitchen LEVEMIR FLEXTOUCH 100 UNIT/ML Pen Inject 15-30 Units into the skin See admin instructions. Take 30 units in the am and 15 units at bedtime.  3  . metoprolol succinate (TOPROL-XL) 25 MG 24 hr tablet Take 25 mg by mouth daily.  3  . NOVOLIN R 100 UNIT/ML injection Inject 0-12 Units into the skin See admin instructions. Per sliding scale <60 call MD and follow hypoglycemia protocol  60-149=0 units 150-200= 2 units 201-250= 4 units 251-300= 6 units 301-350= 8 units 351-400= 10 units 400> 12 units and call MD  0  . clopidogrel (PLAVIX) 75 MG tablet Take 75 mg by mouth daily.  0    Drug Regimen Review Drug regimen was reviewed  and remains appropriate with no significant issues identified  Home: Home Living Family/patient expects to be discharged to:: Inpatient rehab Living Arrangements: Alone Available Help at Discharge: Family, Friend(s), Available PRN/intermittently Type of Home: Other(Comment)(ALF in [place]) Home Equipment:  Dan Humphreys - 2 wheels Additional Comments: Information collected from chart review. No family present to confirm   Functional History: Prior Function Level of Independence: Independent(in a supervised setting) Comments: per family, pt uses RW to ambulate independently, does all his basic ADL;s,  the facility does meds and meals.  Functional Status:  Mobility: Bed Mobility Overal bed mobility: Needs Assistance Bed Mobility: Rolling, Sidelying to Sit Rolling: Mod assist, +2 for safety/equipment Sidelying to sit: Mod assist, +2 for safety/equipment Supine to sit: Mod assist, +2 for safety/equipment Sit to supine: Mod assist, +2 for safety/equipment General bed mobility comments: Mod A +2 for rolling to left and then pushing up into sitting Transfers Overall transfer level: Needs assistance Transfer via Lift Equipment: Stedy Transfers: Sit to/from Stand Sit to Stand: Max assist, +2 physical assistance General transfer comment: Max A +2 to power up into standing and then gain balance. Pt with significant pushing to left  Ambulation/Gait General Gait Details: NT    ADL: ADL Overall ADL's : Needs assistance/impaired Grooming: Minimal assistance, Oral care, Bed level Grooming Details (indicate cue type and reason): Min A to manage swab and drip into cup.  Max A for holding cup in R hand. Pt able to bring swab to mouth and perform brushing motion but requires Max cues to dip swab into cup. Upper Body Bathing: Moderate assistance, Bed level Lower Body Bathing: Maximal assistance, Bed level Upper Body Dressing : Moderate assistance, Bed level Lower Body Dressing: Maximal assistance, Bed level Lower Body Dressing Details (indicate cue type and reason): Max A for donning socks Toilet Transfer: Maximal assistance, +2 for physical assistance, BSC(stedy) Toilet Transfer Details (indicate cue type and reason): Max A +2 to perform sit<>Stand with stedy and maintain standing balance. Pt with  increased strength at left side and tendency for pushing to the right. Requiring Max cues for postural corrections.  Toileting- Clothing Manipulation and Hygiene: Maximal assistance, +2 for physical assistance Toileting - Clothing Manipulation Details (indicate cue type and reason): Max A +2 for sit<>stand and then to perform toilet hygiene. Pt requiring Max A to maintain standing balance.  Functional mobility during ADLs: Maximal assistance, +2 for physical assistance(stedy) General ADL Comments: Pt performing sit<>stand with stedy and requiring Max A +2. Pt requiring Max A +2 for toilet hygiene.   Cognition: Cognition Overall Cognitive Status: Impaired/Different from baseline Arousal/Alertness: Awake/alert Orientation Level: Other (comment) Attention: Sustained Sustained Attention: Impaired Sustained Attention Impairment: Verbal basic, Functional basic Memory: (DTA) Awareness: (DTA) Behaviors: Impulsive Safety/Judgment: (DTA) Cognition Arousal/Alertness: Awake/alert Behavior During Therapy: Flat affect Overall Cognitive Status: Impaired/Different from baseline Area of Impairment: Attention, Following commands, Safety/judgement, Awareness, Problem solving Current Attention Level: Focused, Sustained Following Commands: Follows one step commands inconsistently, Follows one step commands with increased time Safety/Judgement: Decreased awareness of safety, Decreased awareness of deficits Awareness: Intellectual Problem Solving: Slow processing, Requires tactile cues, Difficulty sequencing, Requires verbal cues General Comments: Pt requiring increased time and cues throughout ADLs.   Physical Exam: Blood pressure (!) 142/78, pulse 66, temperature 98.6 F (37 C), temperature source Oral, resp. rate 15, height 6\' 2"  (1.88 m), weight 90.4 kg, SpO2 98 %. Physical Exam  Vitals reviewed. Constitutional: He appears well-developed and well-nourished.  HENT:  Head: Normocephalic and  atraumatic.  Eyes: Right eye  exhibits no discharge. Left eye exhibits no discharge.  Pupils reactive to light  Neck: Normal range of motion. Neck supple. No thyromegaly present.  Cardiovascular: Normal rate and regular rhythm.  Respiratory: Effort normal and breath sounds normal. No respiratory distress.  GI: Soft. Bowel sounds are normal. He exhibits no distension.  Musculoskeletal:  No edema or tenderness in extremities  Neurological: He is alert.  Global aphasia   MMT limited by ability to follow commands.  Spontaneously moving left upper extremity.  Skin: Skin is warm and dry.  Psychiatric:  Unable to assess due to mentation    Results for orders placed or performed during the hospital encounter of 03/30/18 (from the past 48 hour(s))  Glucose, capillary     Status: Abnormal   Collection Time: 04/02/18  8:40 AM  Result Value Ref Range   Glucose-Capillary 182 (H) 70 - 99 mg/dL  Sodium     Status: None   Collection Time: 04/02/18  9:51 AM  Result Value Ref Range   Sodium 141 135 - 145 mmol/L    Comment: Performed at [facility], [address] [place], [place] [address]  Glucose, capillary     Status: Abnormal   Collection Time: 04/02/18 11:27 AM  Result Value Ref Range   Glucose-Capillary 185 (H) 70 - 99 mg/dL  Glucose, capillary     Status: Abnormal   Collection Time: 04/02/18  5:31 PM  Result Value Ref Range   Glucose-Capillary 241 (H) 70 - 99 mg/dL  Glucose, capillary     Status: Abnormal   Collection Time: 04/02/18  9:37 PM  Result Value Ref Range   Glucose-Capillary 187 (H) 70 - 99 mg/dL   Comment 1 Notify RN    Comment 2 Document in Chart   CBC     Status: Abnormal   Collection Time: 04/03/18  3:44 AM  Result Value Ref Range   WBC 8.0 4.0 - 10.5 K/uL   RBC 4.22 4.22 - 5.81 MIL/uL   Hemoglobin 12.2 (L) 13.0 - 17.0 g/dL   HCT 60.437.5 (L) 54.039.0 - 98.152.0 %   MCV 88.9 80.0 - 100.0 fL   MCH 28.9 26.0 - 34.0 pg   MCHC 32.5 30.0 - 36.0 g/dL   RDW 19.112.2 47.811.5 - 29.515.5  %   Platelets 160 150 - 400 K/uL   nRBC 0.0 0.0 - 0.2 %    Comment: Performed at [facility], [address] [place], [place] [address]  Basic metabolic panel     Status: Abnormal   Collection Time: 04/03/18  3:44 AM  Result Value Ref Range   Sodium 140 135 - 145 mmol/L   Potassium 3.5 3.5 - 5.1 mmol/L   Chloride 105 98 - 111 mmol/L   CO2 24 22 - 32 mmol/L   Glucose, Bld 206 (H) 70 - 99 mg/dL   BUN 11 8 - 23 mg/dL   Creatinine, Ser 8.651.33 (H) 0.61 - 1.24 mg/dL   Calcium 8.5 (L) 8.9 - 10.3 mg/dL   GFR calc non Af Amer 55 (L) >60 mL/min   GFR calc Af Amer >60 >60 mL/min   Anion gap 11 5 - 15    Comment: Performed at [facility], [address] [place], [place] [address]  Glucose, capillary     Status: Abnormal   Collection Time: 04/03/18  6:05 AM  Result Value Ref Range   Glucose-Capillary 204 (H) 70 - 99 mg/dL   Comment 1 Notify RN  Comment 2 Document in Chart   Urinalysis, Routine w reflex microscopic     Status: Abnormal   Collection Time: 04/03/18  8:49 AM  Result Value Ref Range   Color, Urine YELLOW YELLOW   APPearance CLEAR CLEAR   Specific Gravity, Urine 1.015 1.005 - 1.030   pH 5.0 5.0 - 8.0   Glucose, UA >=500 (A) NEGATIVE mg/dL   Hgb urine dipstick NEGATIVE NEGATIVE   Bilirubin Urine NEGATIVE NEGATIVE   Ketones, ur NEGATIVE NEGATIVE mg/dL   Protein, ur NEGATIVE NEGATIVE mg/dL   Nitrite POSITIVE (A) NEGATIVE   Leukocytes, UA NEGATIVE NEGATIVE   RBC / HPF 0-5 0 - 5 RBC/hpf   WBC, UA 0-5 0 - 5 WBC/hpf   Bacteria, UA MANY (A) NONE SEEN   Squamous Epithelial / LPF 0-5 0 - 5   Mucus PRESENT     Comment: Performed at [facility], [address], [place], [place] [address]  Glucose, capillary     Status: Abnormal   Collection Time: 04/03/18 11:59 AM  Result Value Ref Range   Glucose-Capillary 222 (H) 70 - 99 mg/dL   Comment 1 Notify RN    Comment 2 Document in Chart   Glucose, capillary     Status: Abnormal   Collection Time: 04/03/18  4:49  PM  Result Value Ref Range   Glucose-Capillary 252 (H) 70 - 99 mg/dL   Comment 1 Notify RN    Comment 2 Document in Chart   Glucose, capillary     Status: Abnormal   Collection Time: 04/03/18  9:20 PM  Result Value Ref Range   Glucose-Capillary 151 (H) 70 - 99 mg/dL   Comment 1 Notify RN    Comment 2 Document in Chart   CBC     Status: Abnormal   Collection Time: 04/04/18  4:36 AM  Result Value Ref Range   WBC 7.2 4.0 - 10.5 K/uL   RBC 4.37 4.22 - 5.81 MIL/uL   Hemoglobin 12.6 (L) 13.0 - 17.0 g/dL   HCT 60.4 (L) 54.0 - 98.1 %   MCV 87.0 80.0 - 100.0 fL   MCH 28.8 26.0 - 34.0 pg   MCHC 33.2 30.0 - 36.0 g/dL   RDW 19.1 47.8 - 29.5 %   Platelets 174 150 - 400 K/uL   nRBC 0.0 0.0 - 0.2 %    Comment: Performed at [facility], [address], [place], [place] [address]  Basic metabolic panel     Status: Abnormal   Collection Time: 04/04/18  4:36 AM  Result Value Ref Range   Sodium 137 135 - 145 mmol/L   Potassium 3.6 3.5 - 5.1 mmol/L   Chloride 99 98 - 111 mmol/L   CO2 27 22 - 32 mmol/L   Glucose, Bld 221 (H) 70 - 99 mg/dL   BUN 11 8 - 23 mg/dL   Creatinine, Ser 8.65 0.61 - 1.24 mg/dL   Calcium 8.9 8.9 - 78.4 mg/dL   GFR calc non Af Amer >60 >60 mL/min   GFR calc Af Amer >60 >60 mL/min   Anion gap 11 5 - 15    Comment: Performed at [facility], [address], [place], [place] [address]   Dg Chest Port 1 View  Result Date: 04/03/2018 CLINICAL DATA:  Fevers EXAM: PORTABLE CHEST 1 VIEW COMPARISON:  None. FINDINGS: The heart size and mediastinal contours are within normal limits. Both lungs are clear. The visualized skeletal structures are unremarkable.  IMPRESSION: No active disease. Electronically Signed   By: Alcide Clever M.D.   On: 04/03/2018 09:50   Vas [place] Lower Extremity Venous (dvt)  Result Date: 04/02/2018  Lower Venous Study Indications: Stroke.  Performing Technologist: Marilynne Halsted RDMS, RVT  Examination Guidelines: A complete evaluation includes  B-mode imaging, spectral Doppler, color Doppler, and power Doppler as needed of all accessible portions of each vessel. Bilateral testing is considered an integral part of a complete examination. Limited examinations for reoccurring indications may be performed as noted.  Right Venous Findings: +---------+---------------+---------+-----------+----------+-------+          CompressibilityPhasicitySpontaneityPropertiesSummary +---------+---------------+---------+-----------+----------+-------+ CFV      Full           Yes      Yes                          +---------+---------------+---------+-----------+----------+-------+ SFJ      Full                                                 +---------+---------------+---------+-----------+----------+-------+ FV Prox  Full                                                 +---------+---------------+---------+-----------+----------+-------+ FV Mid   Full                                                 +---------+---------------+---------+-----------+----------+-------+ FV DistalFull                                                 +---------+---------------+---------+-----------+----------+-------+ PFV      Full                                                 +---------+---------------+---------+-----------+----------+-------+ POP      Full           Yes      Yes                          +---------+---------------+---------+-----------+----------+-------+ PTV      Full                                                 +---------+---------------+---------+-----------+----------+-------+ PERO     Full                                                 +---------+---------------+---------+-----------+----------+-------+  Left Venous Findings: +---------+---------------+---------+-----------+----------+-------+  CompressibilityPhasicitySpontaneityPropertiesSummary  +---------+---------------+---------+-----------+----------+-------+ CFV      Full           Yes      Yes                          +---------+---------------+---------+-----------+----------+-------+ SFJ      Full                                                 +---------+---------------+---------+-----------+----------+-------+ FV Prox  Full                                                 +---------+---------------+---------+-----------+----------+-------+ FV Mid   Full                                                 +---------+---------------+---------+-----------+----------+-------+ FV DistalFull                                                 +---------+---------------+---------+-----------+----------+-------+ PFV      Full                                                 +---------+---------------+---------+-----------+----------+-------+ POP      Full           Yes      Yes                          +---------+---------------+---------+-----------+----------+-------+ PTV      Full                                                 +---------+---------------+---------+-----------+----------+-------+ PERO     Full                                                 +---------+---------------+---------+-----------+----------+-------+    Summary: Right: There is no evidence of deep vein thrombosis in the lower extremity. No cystic structure found in the popliteal fossa. Left: There is no evidence of deep vein thrombosis in the lower extremity. No cystic structure found in the popliteal fossa.  *See table(s) above for measurements and observations. Electronically signed by Fabienne Bruns MD on 04/02/2018 at 5:39:13 PM.    Final        Medical Problem List and Plan: 1.  Right side weakness with aphasia/dysphagia secondary to large left MCA infarction as well as history of CVA 2013.aspirin and Plavix 3 months 2.  DVT Prophylaxis/Anticoagulation:  subcutaneous Lovenox. Monitor for any bleeding  episodes 3. Pain Management:  Tylenol as needed 4. Mood: BuSpar 7.5 mg twice a day, Provide emotional support 5. Neuropsych: This patient is capable of making decisions on his own behalf. 6. Skin/Wound Care:  Routine skin checks 7. Fluids/Electrolytes/Nutrition:  Routine in and out's with follow-up chemistries 8.Hypertension. Norvasc 10 mg daily, clonidine 0.2 mg daily, Toprol-XL 50 mg daily 9. Dysphagia. Dysphagia #2 nectar thick liquids. Follow-up speech therapy 10. Diabetes mellitus with peripheral neuropathy. Hemoglobin A1c 7.3. Glucotrol 5 mg twice a day,Levemir 15 units twice a day. Check blood sugars before meals and at bedtime. Diabetic teaching 11. Hyperlipidemia. Lipitor 12.Suspect UTI. Urine study positive nitrites cultures and sensitivities pending.Begin empiric coverage   Post Admission Physician Evaluation: 1. Preadmission assessment reviewed and changes made below. 2. Functional deficits secondary  to large left MCA infarction with history of CVA. 3. Patient is admitted to receive collaborative, interdisciplinary care between the physiatrist, rehab nursing staff, and therapy team. 4. Patient's level of medical complexity and substantial therapy needs in context of that medical necessity cannot be provided at a lesser intensity of care such as a SNF. 5. Patient has experienced substantial functional loss from his/her baseline which was documented above under the "Functional History" and "Functional Status" headings.  Judging by the patient's diagnosis, physical exam, and functional history, the patient has potential for functional progress which will result in measurable gains while on inpatient rehab.  These gains will be of substantial and practical use upon discharge  in facilitating mobility and self-care at the household level. 6. Physiatrist will provide 24 hour management of medical needs as well as oversight of the therapy  plan/treatment and provide guidance as appropriate regarding the interaction of the two. 7. 24 hour rehab nursing will assist with bladder management, bowel management, safety, skin/wound care, disease management, medication administration and patient education  and help integrate therapy concepts, techniques,education, etc. 8. PT will assess and treat for/with: Lower extremity strength, range of motion, stamina, balance, functional mobility, safety, adaptive techniques and equipment, coping skills, pain control, education. Goals are: Min/mod A. 9. OT will assess and treat for/with: ADL's, functional mobility, safety, upper extremity strength, adaptive techniques and equipment, ego support, and community reintegration.   Goals are: Min/mod a. Therapy may proceed with showering this patient. 10. SLP will assess and treat for/with: Speech, language, swallowing, cognition.  Goals are: Min/Mod A. 11. Case Management and Social Worker will assess and treat for psychological issues and discharge planning. 12. Team conference will be held weekly to assess progress toward goals and to determine barriers to discharge. 13. Patient will receive at least 3 hours of therapy per day at least 5 days per week. 14. ELOS: 23-27 days.       15. Prognosis:  good and fair  I have personally performed a face to face diagnostic evaluation, including, but not limited to relevant history and physical exam findings, of this patient and developed relevant assessment and plan.  Additionally, I have reviewed and concur with the physician assistant's documentation above.  The patient's status has not changed. The original post admission physician evaluation remains appropriate, and any changes from the pre-admission screening or documentation from the acute chart are noted above.    Maryla Morrow, MD, ABPMR Mcarthur Rossetti Angiulli, PA-C 04/04/2018

## 2018-04-04 NOTE — Progress Notes (Signed)
Marcello Fennel, MD  Physician  Physical Medicine and Rehabilitation  Consult Note  Signed  Date of Service:  04/02/2018 8:00 AM       Related encounter: ED to Hosp-Admission (Discharged) from 03/30/2018 in [facility]      Signed      Expand All Collapse All    Show:Clear all [x] Manual[x] Template[] Copied  Added by: [x] Angiulli, Mcarthur Rossetti, PA-C[x] Marcello Fennel, MD  [] Hover for details      Physical Medicine and Rehabilitation Consult Reason for Consult: Decreased functional mobility Referring Physician:  Dr. Pearlean Brownie   HPI: GORAN OLDEN is a 67 y.o.right hand male with history of hypertension, diabetes mellitus, hyperlipidemia and previous CVA maintained on Plavix. Per chart review and daughter, patient lives alone independent with assistive device prior to admission. Patient lives in [place]/ALF and was visiting family in the area. Presented 03/30/2018 with increased speech difficulty and limited mobility. Cranial CT showing remote left PCA territory nonhemorrhagic infarction. CT angiogram of head and neck showed large left MCA territory infarct with extremely poor collaterals. Emergent large vessel occlusion proximal left M1 segment. Echocardiogram with ejection fraction 65% without emboli. Follow-up MRI reviewed, showing large MCA infarct.  Dysphagia #2 nectar thick liquids. Plan for TEE today. Currently on aspirin for CVA prophylaxis. Therapy evaluation completed with recommendations of physical medicine rehabilitation consult.  Review of Systems  Unable to perform ROS: Language   Per chart: Hypertension, diabetes mellitus, hyperlipidemia, previous CVA,  Unable to obtain from patient      Past Surgical History:  Procedure Laterality Date  . IR PATIENT EVAL TECH 0-60 MINS  03/30/2018   History reviewed. No pertinent family history.,  Unable to obtain from patient Social History:  has no tobacco, alcohol, and drug history on file.,   Unable to obtain from patient Allergies: No Known Allergies       Medications Prior to Admission  Medication Sig Dispense Refill  . amLODipine (NORVASC) 10 MG tablet Take 10 mg by mouth daily.  3  . atorvastatin (LIPITOR) 20 MG tablet Take 20 mg by mouth at bedtime.  3  . busPIRone (BUSPAR) 5 MG tablet Take 7.5 mg by mouth 2 (two) times daily.  3  . cloNIDine (CATAPRES) 0.2 MG tablet Take 0.2 mg by mouth 2 (two) times daily.  6  . diclofenac (FLECTOR) 1.3 % PTCH Place 1 patch onto the skin every 12 (twelve) hours.  3  . glipiZIDE (GLUCOTROL) 10 MG tablet Take 10 mg by mouth 2 (two) times daily.  3  . hydrALAZINE (APRESOLINE) 100 MG tablet Take 100 mg by mouth 3 (three) times daily.    Marland Kitchen LEVEMIR FLEXTOUCH 100 UNIT/ML Pen Inject 15-30 Units into the skin See admin instructions. Take 30 units in the am and 15 units at bedtime.  3  . metoprolol succinate (TOPROL-XL) 25 MG 24 hr tablet Take 25 mg by mouth daily.  3  . NOVOLIN R 100 UNIT/ML injection Inject 0-12 Units into the skin See admin instructions. Per sliding scale <60 call MD and follow hypoglycemia protocol  60-149=0 units 150-200= 2 units 201-250= 4 units 251-300= 6 units 301-350= 8 units 351-400= 10 units 400> 12 units and call MD  0  . clopidogrel (PLAVIX) 75 MG tablet Take 75 mg by mouth daily.  0    Home: Home Living Family/patient expects to be discharged to:: Inpatient rehab Living Arrangements: Alone Available Help at Discharge: Family, Friend(s), Available PRN/intermittently Type of Home: Other(Comment)(ALF in [place])  Home Equipment: Walker - 2 wheels  Functional History: Prior Function Level of Independence: Independent with assistive device(s)(in a supervised setting) Comments: per family, pt uses RW to ambulate independently, does all his basic ADL;s,  the facility does meds and meals. Functional Status:  Mobility: Bed Mobility Overal bed mobility: Needs Assistance Bed Mobility: Supine to Sit,  Sit to Supine Supine to sit: Mod assist, +2 for safety/equipment Sit to supine: Mod assist, +2 for safety/equipment General bed mobility comments: pt followed tactile and gestural cue to get up.  needed truncal assist and scoot assist Transfers Overall transfer level: Needs assistance Transfers: Sit to/from Stand Sit to Stand: Mod assist, +2 physical assistance General transfer comment: hand over hand cues for hand placement, assist for forward and up. Ambulation/Gait General Gait Details: NT  ADL:  Cognition: Cognition Overall Cognitive Status: Impaired/Different from baseline(NT formally) Arousal/Alertness: Awake/alert Orientation Level: Other (comment)(UTA expressive aphasia) Attention: Sustained Sustained Attention: Impaired Sustained Attention Impairment: Verbal basic, Functional basic Memory: (DTA) Awareness: (DTA) Behaviors: Impulsive Safety/Judgment: (DTA) Cognition Arousal/Alertness: Awake/alert Behavior During Therapy: Restless Overall Cognitive Status: Impaired/Different from baseline(NT formally) Area of Impairment: Attention, Following commands, Safety/judgement, Awareness, Problem solving Current Attention Level: Focused, Sustained Following Commands: Follows one step commands inconsistently, Follows one step commands with increased time Safety/Judgement: Decreased awareness of safety, Decreased awareness of deficits Awareness: Intellectual Problem Solving: Slow processing, Requires tactile cues, Difficulty sequencing, Requires verbal cues  Blood pressure (!) 161/82, pulse 76, temperature 99.4 F (37.4 C), temperature source Oral, resp. rate 11, height 6\' 2"  (1.88 m), weight 86.7 kg, SpO2 100 %. Physical Exam  Constitutional: He appears well-developed and well-nourished.  HENT:  Head: Normocephalic and atraumatic.  Eyes:  Only briefly opens eyes  Neck: Normal range of motion. Neck supple. No thyromegaly present.  Cardiovascular: Normal rate and regular  rhythm.  Respiratory: Effort normal and breath sounds normal. No respiratory distress.  GI: Soft. Bowel sounds are normal. He exhibits no distension.  Musculoskeletal:  No edema or tenderness in extremities  Neurological:  Patient is extremely lethargic.  Aphasic with limited verbal output.  Does not follow commands. No spontaneous movement noted  Skin: Skin is warm and dry.  Psychiatric:  Unable to assess due to mentation    LabResultsLast24Hours  Results for orders placed or performed during the hospital encounter of 03/30/18 (from the past 24 hour(s))  Sodium     Status: Abnormal   Collection Time: 04/01/18 10:34 AM  Result Value Ref Range   Sodium 146 (H) 135 - 145 mmol/L  Glucose, capillary     Status: Abnormal   Collection Time: 04/01/18 11:18 AM  Result Value Ref Range   Glucose-Capillary 194 (H) 70 - 99 mg/dL   Comment 1 Notify RN    Comment 2 Document in Chart   Sodium     Status: None   Collection Time: 04/01/18  4:25 PM  Result Value Ref Range   Sodium 145 135 - 145 mmol/L  Glucose, capillary     Status: Abnormal   Collection Time: 04/01/18  4:33 PM  Result Value Ref Range   Glucose-Capillary 219 (H) 70 - 99 mg/dL   Comment 1 Notify RN    Comment 2 Document in Chart   Glucose, capillary     Status: Abnormal   Collection Time: 04/01/18  8:44 PM  Result Value Ref Range   Glucose-Capillary 167 (H) 70 - 99 mg/dL  Sodium     Status: None   Collection Time: 04/01/18 10:40 PM  Result Value Ref Range   Sodium 138 135 - 145 mmol/L  CBC     Status: None   Collection Time: 04/02/18  4:00 AM  Result Value Ref Range   WBC 9.6 4.0 - 10.5 K/uL   RBC 4.89 4.22 - 5.81 MIL/uL   Hemoglobin 14.1 13.0 - 17.0 g/dL   HCT 16.144.1 09.639.0 - 04.552.0 %   MCV 90.2 80.0 - 100.0 fL   MCH 28.8 26.0 - 34.0 pg   MCHC 32.0 30.0 - 36.0 g/dL   RDW 40.912.9 81.111.5 - 91.415.5 %   Platelets 202 150 - 400 K/uL   nRBC 0.0 0.0 - 0.2 %  Basic metabolic panel     Status:  Abnormal   Collection Time: 04/02/18  4:00 AM  Result Value Ref Range   Sodium 142 135 - 145 mmol/L   Potassium 3.9 3.5 - 5.1 mmol/L   Chloride 105 98 - 111 mmol/L   CO2 28 22 - 32 mmol/L   Glucose, Bld 203 (H) 70 - 99 mg/dL   BUN 10 8 - 23 mg/dL   Creatinine, Ser 7.821.22 0.61 - 1.24 mg/dL   Calcium 9.7 8.9 - 95.610.3 mg/dL   GFR calc non Af Amer >60 >60 mL/min   GFR calc Af Amer >60 >60 mL/min   Anion gap 9 5 - 15      ImagingResults(Last48hours)  Ct Head Wo Contrast  Result Date: 04/02/2018 CLINICAL DATA:  Follow-up examination for acute stroke. EXAM: CT HEAD WITHOUT CONTRAST TECHNIQUE: Contiguous axial images were obtained from the base of the skull through the vertex without intravenous contrast. COMPARISON:  Previous MRI from 03/31/2018. FINDINGS: Brain: Extensive of all veins cytotoxic edema throughout the majority of the left MCA territory, consistent with evolving acute ischemic infarct. Overall, distribution relatively stable from previous. No evidence for hemorrhagic transformation by CT. Localized mass effect with mild attenuation of the adjacent left lateral ventricle with trace 1-2 mm left-to-right shift. No hydrocephalus or ventricular trapping. Basilar cisterns remain patent. No other new acute large vessel territory infarct. No intracranial hemorrhage. Underlying atrophy with chronic microvascular ischemic disease with chronic left PCA territory infarct noted. No extra-axial fluid collection. Vascular: Abnormal hyperdensity within the left MCA branches, compatible thrombus. Calcified atherosclerosis at the skull base. Skull: Scalp soft tissues and calvarium demonstrate no acute finding. Sinuses/Orbits: Globes and orbital soft tissues within normal limits. Paranasal sinuses and mastoids are clear. Other: None. IMPRESSION: 1. Continued interval evolution of large acute left MCA territory infarct. No evidence for hemorrhagic transformation by CT. Localized mass effect with  trace 1-2 mm left-to-right shift. 2. Otherwise stable appearance of the brain. No other new acute intracranial abnormality. Electronically Signed   By: Rise MuBenjamin  McClintock M.D.   On: 04/02/2018 06:41   Mr Brain Wo Contrast  Result Date: 03/31/2018 CLINICAL DATA:  Follow-up large left MCA infarct.  Status post tPA. EXAM: MRI HEAD WITHOUT CONTRAST TECHNIQUE: Multiplanar, multiecho pulse sequences of the brain and surrounding structures were obtained without intravenous contrast. COMPARISON:  Abbreviated brain MRI 03/30/2018. Head CT, CTA, and cerebral perfusion 03/30/2018. FINDINGS: Brain: As seen on yesterday's MRI, there is a large acute infarct involving nearly the entirety of the left MCA territory. There is new cytotoxic edema with mild sulcal effacement but no midline shift or other significant mass effect. There is minimal associated scattered petechial hemorrhage. A large chronic left PCA infarct is again noted with ex vacuo dilatation of the left lateral ventricle. There is a background of extensive  chronic small vessel ischemic disease throughout the cerebral white matter and pons. Numerous chronic microhemorrhages are present in the cerebellum, brainstem, and both cerebral hemispheres including deep gray nuclei (particularly right thalamus), likely related to longstanding hypertension. Tiny chronic infarcts are noted at the posterior aspect of the left middle cerebellar peduncle. Small chronic infarcts are also present in the thalami. There is moderate cerebral atrophy, and there is extensive atrophy of the corpus callosum. There is no extra-axial fluid collection. Vascular: Abnormal appearance of the left MCA corresponding to occlusion shown on CTA. Skull and upper cervical spine: No suspicious marrow lesion. Sinuses/Orbits: Unremarkable orbits. Paranasal sinuses and mastoid air cells are clear. Other: None. IMPRESSION: 1. Evolving large acute left MCA infarct with new cytotoxic edema and minimal  petechial hemorrhage. No midline shift. 2. Large chronic left PCA infarct. 3. Severe chronic small vessel ischemic disease. Coexistent multiple sclerosis is not excluded by imaging. Electronically Signed   By: Sebastian Ache M.D.   On: 03/31/2018 11:52   Dg Swallowing Func-speech Pathology  Result Date: 04/01/2018 Objective Swallowing Evaluation: Type of Study: MBS-Modified Barium Swallow Study  Patient Details Name: EFREN KROSS MRN: [id number] Date of Birth: Feb 04, 1951 Today's Date: 04/01/2018 Time: SLP Start Time (ACUTE ONLY): 0945 -SLP Stop Time (ACUTE ONLY): 1007 SLP Time Calculation (min) (ACUTE ONLY): 22 min Past Medical History: No past medical history on file. Past Surgical History: Past Surgical History: Procedure Laterality Date . IR PATIENT EVAL TECH 0-60 MINS  03/30/2018 HPI: 67 y.o. male with a history of previous stroke, hypertension, diabetes, hyperlipidemia who presents with right-sided weakness and aphasia that started abruptly at 9 AM.  He was in his normal state of health and came downstairs and when his eating breakfast when he suddenly stopped responding. MRI brain 03/31/18 indicated Evolving large acute left MCA infarct with new cytotoxic edema and minimal petechial hemorrhage. No midline shift. 2. Large chronic left PCA infarct. 3. Severe chronic small vessel ischemic disease.  No data recorded Assessment / Plan / Recommendation CHL IP CLINICAL IMPRESSIONS 04/01/2018 Clinical Impression Pt demosntrates a primary oral dysphagia with sensory/motor impairment on the right leading to decreased labial seal and mild pooling in right buccal cavity. Extra time is needed for bolus formation, but pt uses a poterior head tilt instinctively to facilitate transit. Even with large impulsive sips, though liquids pooled in the pyriforms prior to swallow, only trace silent aspiration occured with thin liquids and not at all with nectar over various trials. Pt could not follow instruction to cough/clear  aspirate. Given pts ability to masticate and orally manipulate soldis with extra time, recomemnd intiating a dys 2 (fine chopped) diet with nectar thick liquids with supervision. Exra care needed to clear right buccal cavity after intake. SLP will f/u for tolerance.  SLP Visit Diagnosis Dysphagia, oropharyngeal phase (R13.12) Attention and concentration deficit following -- Frontal lobe and executive function deficit following -- Impact on safety and function Mild aspiration risk   CHL IP TREATMENT RECOMMENDATION 04/01/2018 Treatment Recommendations Therapy as outlined in treatment plan below   Prognosis 04/01/2018 Prognosis for Safe Diet Advancement Good Barriers to Reach Goals -- Barriers/Prognosis Comment -- CHL IP DIET RECOMMENDATION 04/01/2018 SLP Diet Recommendations Dysphagia 2 (Fine chop) solids;Nectar thick liquid Liquid Administration via Cup;Straw Medication Administration Whole meds with liquid Compensations Slow rate;Small sips/bites;Minimize environmental distractions;Monitor for anterior loss;Lingual sweep for clearance of pocketing Postural Changes Seated upright at 90 degrees   CHL IP OTHER RECOMMENDATIONS 04/01/2018 Recommended Consults -- Oral Care Recommendations Oral  care before and after PO Other Recommendations Order thickener from pharmacy;Have oral suction available   CHL IP FOLLOW UP RECOMMENDATIONS 04/01/2018 Follow up Recommendations Inpatient Rehab   CHL IP FREQUENCY AND DURATION 04/01/2018 Speech Therapy Frequency (ACUTE ONLY) min 2x/week Treatment Duration 2 weeks      CHL IP ORAL PHASE 04/01/2018 Oral Phase Impaired Oral - Pudding Teaspoon -- Oral - Pudding Cup -- Oral - Honey Teaspoon -- Oral - Honey Cup -- Oral - Nectar Teaspoon Right pocketing in lateral sulci;Decreased bolus cohesion;Premature spillage;Delayed oral transit Oral - Nectar Cup Right pocketing in lateral sulci;Decreased bolus cohesion;Premature spillage;Delayed oral transit Oral - Nectar Straw Right pocketing in lateral  sulci;Decreased bolus cohesion;Premature spillage;Delayed oral transit Oral - Thin Teaspoon -- Oral - Thin Cup Right pocketing in lateral sulci;Decreased bolus cohesion;Premature spillage;Delayed oral transit Oral - Thin Straw Right pocketing in lateral sulci;Decreased bolus cohesion;Premature spillage;Delayed oral transit Oral - Puree Right pocketing in lateral sulci;Decreased bolus cohesion;Delayed oral transit Oral - Mech Soft Right pocketing in lateral sulci;Decreased bolus cohesion;Delayed oral transit;Lingual/palatal residue Oral - Regular -- Oral - Multi-Consistency -- Oral - Pill Right pocketing in lateral sulci;Decreased bolus cohesion;Premature spillage;Delayed oral transit Oral Phase - Comment --  CHL IP PHARYNGEAL PHASE 04/01/2018 Pharyngeal Phase Impaired Pharyngeal- Pudding Teaspoon -- Pharyngeal -- Pharyngeal- Pudding Cup -- Pharyngeal -- Pharyngeal- Honey Teaspoon -- Pharyngeal -- Pharyngeal- Honey Cup -- Pharyngeal -- Pharyngeal- Nectar Teaspoon Delayed swallow initiation-pyriform sinuses Pharyngeal -- Pharyngeal- Nectar Cup Delayed swallow initiation-pyriform sinuses Pharyngeal -- Pharyngeal- Nectar Straw Delayed swallow initiation-pyriform sinuses Pharyngeal -- Pharyngeal- Thin Teaspoon -- Pharyngeal -- Pharyngeal- Thin Cup Delayed swallow initiation-pyriform sinuses;Penetration/Aspiration before swallow;Trace aspiration Pharyngeal Material enters airway, passes BELOW cords without attempt by patient to eject out (silent aspiration) Pharyngeal- Thin Straw Delayed swallow initiation-pyriform sinuses;Penetration/Aspiration before swallow;Trace aspiration Pharyngeal Material enters airway, passes BELOW cords without attempt by patient to eject out (silent aspiration) Pharyngeal- Puree Delayed swallow initiation-vallecula Pharyngeal -- Pharyngeal- Mechanical Soft -- Pharyngeal -- Pharyngeal- Regular -- Pharyngeal -- Pharyngeal- Multi-consistency -- Pharyngeal -- Pharyngeal- Pill -- Pharyngeal --  Pharyngeal Comment --  No flowsheet data found. Harlon Ditty, MA CCC-SLP Acute Rehabilitation Services Pager [phone] Office [phone] Claudine Mouton 04/01/2018, 10:20 AM                Assessment/Plan: Diagnosis: Large left MCA infarct Labs and images (see above) independently reviewed.  Records reviewed and summated above. Stroke: Continue secondary stroke prophylaxis and Risk Factor Modification listed below:   Antiplatelet therapy:   Blood Pressure Management:  Continue current medication with prn's with permisive HTN per primary team Statin Agent:   Diabetes management:   ?  Right sided hemiparesis: fit for orthosis to prevent contractures (resting hand splint for day, wrist cock up splint at night, PRAFO, etc) Motor recovery: Fluoxetine  1. Does the need for close, 24 hr/day medical supervision in concert with the patient's rehab needs make it unreasonable for this patient to be served in a less intensive setting? Yes  2. Co-Morbidities requiring supervision/potential complications: HTN (monitor and provide prns in accordance with increased physical exertion and pain), DM (Monitor in accordance with exercise and adjust meds as necessary), hyperlipidemia, previous CVA with residual deficits, post-stroke dysphagia (advance diet as tolerated) 3. Due to bladder management, bowel management, safety, skin/wound care, disease management, medication administration and patient education, does the patient require 24 hr/day rehab nursing? Yes 4. Does the patient require coordinated care of a physician, rehab nurse, PT (1-2 hrs/day, 5 days/week), OT (1-2  hrs/day, 5 days/week) and SLP (1-2 hrs/day, 5 days/week) to address physical and functional deficits in the context of the above medical diagnosis(es)? Yes Addressing deficits in the following areas: balance, endurance, locomotion, strength, transferring, bowel/bladder control, bathing, dressing, feeding, grooming, toileting,  cognition, speech, language, swallowing and psychosocial support 5. Can the patient actively participate in an intensive therapy program of at least 3 hrs of therapy per day at least 5 days per week? Potentially 6. The potential for patient to make measurable gains while on inpatient rehab is excellent 7. Anticipated functional outcomes upon discharge from inpatient rehab are min assist  with PT, min assist with OT, min assist and mod assist with SLP. 8. Estimated rehab length of stay to reach the above functional goals is: 22-27 days. 9. Anticipated D/C setting: Home 10. Anticipated post D/C treatments: HH therapy and Home excercise program 11. Overall Rehab/Functional Prognosis: good and fair  RECOMMENDATIONS: This patient's condition is appropriate for continued rehabilitative care in the following setting: Potentially CIR after completion of medical work-up, able to tolerate 3 hours of therapy per day, and discussion with daughter regarding long-term discharge disposition. Patient has agreed to participate in recommended program. Potentially Note that insurance prior authorization may be required for reimbursement for recommended care.  Comment: Rehab Admissions Coordinator to follow up.   I have personally performed a face to face diagnostic evaluation, including, but not limited to relevant history and physical exam findings, of this patient and developed relevant assessment and plan.  Additionally, I have reviewed and concur with the physician assistant's documentation above.   Maryla Morrow, MD, ABPMR Mcarthur Rossetti Angiulli, PA-C 04/02/2018        Revision History                        Routing History

## 2018-04-04 NOTE — Care Management Note (Signed)
Case Management Note  Patient Details  Name: Douglas [name] MRN: [id number] Date of Birth: 31-Mar-1951  Subjective/Objective:                    Action/Plan: Pt discharging to CIR today. CM signing off.   Expected Discharge Date:  04/04/18               Expected Discharge Plan:  IP Rehab Facility  In-House Referral:     Discharge planning Services  CM Consult  Post Acute Care Choice:    Choice offered to:     DME Arranged:    DME Agency:     HH Arranged:    HH Agency:     Status of Service:  Completed, signed off  If discussed at [employer] Meetings, dates discussed:    Additional Comments:  Douglas BaloKelli F Nettye Flegal, RN 04/04/2018, 12:21 PM

## 2018-04-04 NOTE — Progress Notes (Signed)
Standley Brooking, RN  Rehab Admission Coordinator  Physical Medicine and Rehabilitation  PMR Pre-admission  Signed  Date of Service:  04/04/2018 11:12 AM       Related encounter: ED to Hosp-Admission (Discharged) from 03/30/2018 in [facility]      Signed         Show:Clear all [x] Manual[x] Template[x] Copied  Added by: [x] Standley Brooking, RN  [] Hover for details PMR Admission Coordinator Pre-Admission Assessment  Patient: Douglas [name] is an 67 y.o., male MRN: [id number] DOB: May 30, 1950 Height: 6\' 2"  (188 cm) Weight: 90.4 kg                                                                                                                                                  Insurance Information HMO:     PPO:      PCP:      IPA:      80/20:      OTHER: no HMO PRIMARY: Medicare a and b      Policy#: [id number]      Subscriber: pt Patient from [place]/Daughter to obtain new Medicare number and provide to staff Benefits:  Phone #: passport one online     Name: 04/04/2018 Eff. Date: 12/31/2013     Deduct: $1364      Out of Pocket Max: none      Life Max: none CIR: 100%      SNF: 20 full days Outpatient: 80%     Co-Pay: 20% Home Health: 100%      Co-Pay: none DME: 80%     Co-Pay: 20% Providers: pt choice  SECONDARY: [employer]      Policy#: [id number]      Subscriber: pt group # [id number] I emailed to [employer] at pre service center on 04/04/18 the [employer] information to be added to the system  Medicaid Application Date:       Case Manager:  Disability Application Date:       Case Worker:   Emergency Chief Operating Officer Information    Name Relation Home Work Mobile   [name], [name] Daughter   [phone]     Current Medical History  Patient Admitting Diagnosis: Left MCA infarct  History of Present Illness:Douglas [name] is a 67 year old right-handed male with history of hypertension, diabetes mellitus, hyperlipidemia and  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 . Presented 03/30/2018 with increasing speech difficulty and limited mobility. Cranial CT scan showed remote left PCA territory nonhemorrhagic infarction. CT angiogram of head and neck showed large left MCA territory infarction with extremely poor collaterals. Emergent large vessel occlusion proximal left M1 segment. Echocardiogram with ejection fraction of 65% without emboli. Follow-up MRI reviewed showing large MCA infarction. Dysphagia #2 nectar thick liquid diet.TEE completed 04/02/2018 ejection  fraction 65% thrombus noted in the left atrial appendage no PFO. Patient currently maintained on aspirin and Plavix for CVA prophylaxis. Subcutaneous Lovenox for DVT prophylaxis.Patient spiked a low-grade fever 04/03/2018 with chest x-ray negative and urinalysis positive nitrite.Cultures and sensitivities pending.  Complete NIHSS TOTAL: 15  Past Medical History      Past Medical History:  Diagnosis Date  . Diabetes mellitus without complication (HCC)   . Hypertension   . Stroke [facility])     Family History  family history is not on file.  Prior Rehab/Hospitalizations:  Has the patient had major surgery during 100 days prior to admission? No After first CVA in [place], patient received inpatient rehab services for a month and went home with daughter, Douglas [name]. Eventually moved out to this "ALF/ILF" where he was Mod I on RW.  Current Medications   Current Facility-Administered Medications:  .  0.9 %  sodium chloride infusion, , Intravenous, Continuous, [name], MD, Last Rate: 75 mL/hr at 04/04/18 0400 .  acetaminophen (TYLENOL) tablet 650 mg, 650 mg, Oral, Q4H PRN, 650 mg at 04/02/18 1948 **OR** acetaminophen (TYLENOL) solution 650 mg, 650 mg, Per Tube, Q4H PRN **OR** acetaminophen (TYLENOL) suppository 650 mg, 650 mg, Rectal, Q4H PRN, [name], MD, 650 mg at 04/01/18 1610 .  amLODipine (NORVASC) tablet 10  mg, 10 mg, Oral, Daily, [name], MD, 10 mg at 04/03/18 0910 .  aspirin EC tablet 325 mg, 325 mg, Oral, Daily, [name], MD, 325 mg at 04/03/18 0910 .  atorvastatin (LIPITOR) tablet 20 mg, 20 mg, Oral, QHS, [name], MD, 20 mg at 04/03/18 2241 .  cloNIDine (CATAPRES) tablet 0.2 mg, 0.2 mg, Oral, BID, [name], MD, 0.2 mg at 04/03/18 2240 .  clopidogrel (PLAVIX) tablet 75 mg, 75 mg, Oral, Daily, [name], MD, 75 mg at 04/03/18 0910 .  enoxaparin (LOVENOX) injection 40 mg, 40 mg, Subcutaneous, Q24H, [name], MD, 40 mg at 04/03/18 0950 .  glipiZIDE (GLUCOTROL) tablet 5 mg, 5 mg, Oral, BID AC, [name], MD, 5 mg at 04/03/18 1642 .  hydrALAZINE (APRESOLINE) tablet 50 mg, 50 mg, Oral, TID, [name], MD, 50 mg at 04/03/18 2241 .  insulin aspart (novoLOG) injection 0-15 Units, 0-15 Units, Subcutaneous, TID WC, [name], MD, 8 Units at 04/03/18 1653 .  insulin detemir (LEVEMIR) injection 15 Units, 15 Units, Subcutaneous, BID, [name], MD .  labetalol (NORMODYNE,TRANDATE) injection 20 mg, 20 mg, Intravenous, Q2H PRN, [name], MD .  MEDLINE mouth rinse, 15 mL, Mouth Rinse, BID, [name], MD, 15 mL at 04/03/18 2254 .  metoprolol succinate (TOPROL-XL) 24 hr tablet 50 mg, 50 mg, Oral, Daily, [name], MD, 50 mg at 04/03/18 0951 .  pantoprazole (PROTONIX) EC tablet 40 mg, 40 mg, Oral, Daily, [name], MD, 40 mg at 04/03/18 0911 .  RESOURCE THICKENUP CLEAR, , Oral, PRN, [name], MD  Patients Current Diet:     Diet Order             DIET DYS 2 Room service appropriate? Yes; Fluid consistency: Nectar Thick  Diet effective now               Precautions / Restrictions Precautions Precautions: Fall Precaution Comments: Right hemi Restrictions Weight Bearing Restrictions: No   Has the patient had 2 or more falls or a fall with injury in the past year?No  Prior Activity  Level Limited Community (1-2x/wk): Pateitn was independnet with RW; lived in ILF(Daughter states a duplex that was  turned into a "ALF")   Patient Mod I on RW and did own adls. Lives in a duplex which is an ALF/ILF. He is provided meals and his meds, but otherwise Mod I with all adls.   Home Assistive Devices / Equipment Home Assistive Devices/Equipment: CBG Meter, Environmental consultant (specify type) Home Equipment: Walker - 2 wheels  Prior Device Use: Indicate devices/aids used by the patient prior to current illness, exacerbation or injury? Walker  Prior Functional Level Prior Function Level of Independence: Independent with assistive device(s)(RW) Comments: per family, pt uses RW to ambulate independently, does all his basic ADL;s,  the facility does meds and meals.  Self Care: Did the patient need help bathing, dressing, using the toilet or eating?  Independent  Indoor Mobility: Did the patient need assistance with walking from room to room (with or without device)? Independent  Stairs: Did the patient need assistance with internal or external stairs (with or without device)? Needed some help  Functional Cognition: Did the patient need help planning regular tasks such as shopping or remembering to take medications? Needed some help  Current Functional Level Cognition  Arousal/Alertness: Awake/alert Overall Cognitive Status: Impaired/Different from baseline Current Attention Level: Focused, Sustained Orientation Level: Other (comment) Following Commands: Follows one step commands inconsistently, Follows one step commands with increased time Safety/Judgement: Decreased awareness of safety, Decreased awareness of deficits General Comments: Pt requiring increased time and cues throughout ADLs.  Attention: Sustained Sustained Attention: Impaired Sustained Attention Impairment: Verbal basic, Functional basic Memory: (DTA) Awareness: (DTA) Behaviors: Impulsive Safety/Judgment: (DTA)     Extremity Assessment (includes Sensation/Coordination)  Upper Extremity Assessment: RUE deficits/detail RUE Deficits / Details: Increased tone and noted flexion. No active movement RUE Coordination: decreased fine motor, decreased gross motor  Lower Extremity Assessment: Defer to PT evaluation RLE Deficits / Details: moves spontaneously in synergy.  pt bore some weight in standing, but unable to fully extend his knee. RLE Coordination: decreased fine motor LLE Deficits / Details: WFL    ADLs  Overall ADL's : Needs assistance/impaired Grooming: Minimal assistance, Oral care, Bed level Grooming Details (indicate cue type and reason): Min A to manage swab and drip into cup.  Max A for holding cup in R hand. Pt able to bring swab to mouth and perform brushing motion but requires Max cues to dip swab into cup. Upper Body Bathing: Moderate assistance, Bed level Lower Body Bathing: Maximal assistance, Bed level Upper Body Dressing : Moderate assistance, Bed level Lower Body Dressing: Maximal assistance, Bed level Lower Body Dressing Details (indicate cue type and reason): Max A for donning socks Toilet Transfer: Maximal assistance, +2 for physical assistance, BSC(stedy) Toilet Transfer Details (indicate cue type and reason): Max A +2 to perform sit<>Stand with stedy and maintain standing balance. Pt with increased strength at left side and tendency for pushing to the right. Requiring Max cues for postural corrections.  Toileting- Clothing Manipulation and Hygiene: Maximal assistance, +2 for physical assistance Toileting - Clothing Manipulation Details (indicate cue type and reason): Max A +2 for sit<>stand and then to perform toilet hygiene. Pt requiring Max A to maintain standing balance.  Functional mobility during ADLs: Maximal assistance, +2 for physical assistance(stedy) General ADL Comments: Pt performing sit<>stand with stedy and requiring Max A +2. Pt requiring Max A +2 for toilet  hygiene.     Mobility  Overal bed mobility: Needs Assistance Bed Mobility: Rolling, Sidelying to Sit Rolling: Mod assist, +2 for safety/equipment Sidelying to sit: Mod assist, +2 for safety/equipment Supine to sit:  Mod assist, +2 for safety/equipment Sit to supine: Mod assist, +2 for safety/equipment General bed mobility comments: Mod A +2 for rolling to left and then pushing up into sitting    Transfers  Overall transfer level: Needs assistance Transfer via Lift Equipment: Stedy Transfers: Sit to/from Stand Sit to Stand: Max assist, +2 physical assistance General transfer comment: Max A +2 to power up into standing and then gain balance. Pt with significant pushing to left     Ambulation / Gait / Stairs / Wheelchair Mobility  Ambulation/Gait General Gait Details: NT    Posture / Balance Balance Overall balance assessment: Needs assistance Sitting-balance support: No upper extremity supported, Single extremity supported Sitting balance-Leahy Scale: Fair Standing balance support: During functional activity, Bilateral upper extremity supported Standing balance-Leahy Scale: Poor Standing balance comment: Like in sitting, pt pushes fairly heavily to the R standing in the STEDY.  He is not able to coordinate holding with the R UE.  We will have to use the STEDY sparingly at present    Special needs/care consideration BiPAP/CPAP n/a CPM n/a Continuous Drip IV IVF at 75 cc/hr NS Dialysis n/a Life Vest n/a Oxygen n/a Special Bed seizure pads on bed and floor Trach Size n/a Wound Vac  N/a Skin intact Bowel mgmt: LBM 12/2; incontinent Bladder mgmt: external catheter Diabetic mgmt Hgb A1c 7.3    Previous Home Environment Living Arrangements: Other (Comment)(lives in an "ALF" daughter describes a duplex turned into an)  Lives With: Other (Comment) Available Help at Discharge: Family, Available 24 hours/day(daughter to provide 24/7 min assist in her home) Type of Home:  Other(Comment) Home Layout: One level Home Care Services: No Additional Comments: a duplex turned into an "ALF" per daughter. He will not return there   Patient was living in ALF[place]ILF in [place]. Duplex turned into a facility. Daughter was planning to move patient into a new apartment with her the first of the year even pta.  Discharge Living Setting Plans for Discharge Living Setting: Lives with (comment)(Patietn to d/c home with daughter to her apartment) Type of Home at Discharge: Apartment Discharge Home Layout: One level Discharge Home Access: Level entry Discharge Bathroom Shower/Tub: Tub/shower unit, Curtain Discharge Bathroom Toilet: Standard Discharge Bathroom Accessibility: Yes How Accessible: Accessible via wheelchair, Accessible via walker(daughter to check ability via wheelchair) Does the patient have any problems obtaining your medications?: No   Daughter currently lives in 3 bedroom apartment with her Mom ( patient and Mom divorced) and her sister who has three children. [name] plans for patient to d/c home with her into this apartment  Until over the next 2 months she plans to move herself and her Dad into another apartment which was already planned before this admission. [name]'s sister is moving to [place]( military family ) and [name]'s Mom... Undecided. Patient has 5 children. Douglas BattenKim is 67 years old and can work remotely from home. She plans to get her Dad settled here in [place] for a few days and then to fly home to [place] to make preparations for his discharge. She plans to drive back as to have transportation to drive him back to [place]. I have advised her to verify if driving or flying would be best option for his discharge to [place] before [place] [facility] [place] [facility] [place]. Douglas BattenKim has requested her siblings to help her for she is overwhelmed at this time with the responsibility.  Social/Family/Support Systems Patient Roles: Parent(divorced; has five children) Contact Information:  daughter, Douglas BattenKim Anticipated Caregiver: daughter,  [name] Anticipated Caregiver's Contact Information: [phone] Ability/Limitations of Caregiver: Douglas [name] works but can work remotely from home Caregiver Availability: 24/7 Discharge Plan Discussed with Primary Caregiver: Yes Is Caregiver In Agreement with Plan?: Yes Does Caregiver/Family have Issues with Lodging/Transportation while Pt is in Rehab?: No  Goals/Additional Needs Patient/Family Goal for Rehab: Min PT and OT, min to mod SLP Expected length of stay: [facility]OS 14 to 20 days Special Service Needs: Patietn and daughter form [place], visitng family in [place] over the Thanksgiving Holiday Pt/Family Agrees to Admission and willing to participate: Yes Program Orientation Provided & Reviewed with Pt/Caregiver Including Roles  & Responsibilities: Yes  Barriers to Discharge: (Visiting from [place])  Decrease burden of Care through IP rehab admission: n/a  Possible need for SNF placement upon discharge: Douglas [name] wants to avoid SNF locally and/or in [place].   Patient Condition: This patient's medical and functional status has changed since the consult dated 04/02/2018 in which the Rehabilitation Physician determined and documented that the patient was potentially appropriate for intensive rehabilitative care in an inpatient rehabilitation facility. Issues have been addressed and update has been discussed with Dr. Allena Katz and patient now appropriate for inpatient rehabilitation. Will admit to inpatient rehab today.   Preadmission Screen Completed By:  Clois Dupes, 04/04/2018 11:13 AM ______________________________________________________________________   Discussed status with Dr. Allena Katz on 04/04/2018 at  1127 and received telephone approval for admission today.  Admission Coordinator:  Clois Dupes, time 0981 Date 04/04/2018           Cosigned by: Marcello Fennel, MD at 04/04/2018 12:07 PM  Revision History

## 2018-04-04 NOTE — Discharge Summary (Addendum)
Stroke Discharge Summary  Patient ID: Douglas [name]   MRN: [id number]      DOB: [birth year]  Date of Admission: 03/30/2018 Date of Discharge: 04/04/2018  Attending Physician:  [name], MD, Stroke MD Consultant(s):   Maryla Morrow, MD (Physical Medicine & Rehabtilitation)  Patient's PCP:  Has PCP in [facility]  Discharge Diagnoses:  Principal Problem:   Acute embolic stroke [facility]) Active Problems:   Diabetes 1.5, managed as type 2 (HCC)   Essential hypertension   Dyslipidemia   History of CVA with residual deficit   Dysphagia, post-stroke   Cerebral edema (HCC)   Renal insufficiency  Past Medical History:  Diagnosis Date  . Diabetes mellitus without complication (HCC)   . Hypertension   . Stroke [facility])    Past Surgical History:  Procedure Laterality Date  . IR PATIENT EVAL TECH 0-60 MINS  03/30/2018  . TEE WITHOUT CARDIOVERSION N/A 04/02/2018   Procedure: TRANSESOPHAGEAL ECHOCARDIOGRAM (TEE);  Surgeon: [name], MD;  Location: [facility] ENDOSCOPY;  Service: Cardiovascular;  Laterality: N/A;    Medications to be continued on Rehab Allergies as of 04/04/2018   No Known Allergies     Medication List    STOP taking these medications   LEVEMIR FLEXTOUCH 100 UNIT/ML Pen Generic drug:  Insulin Detemir Replaced by:  insulin detemir 100 UNIT/ML injection   NOVOLIN R 100 units/mL injection Generic drug:  insulin regular     TAKE these medications   amLODipine 10 MG tablet Commonly known as:  NORVASC Take 10 mg by mouth daily.   aspirin 325 MG EC tablet Take 1 tablet (325 mg total) by mouth daily.   atorvastatin 20 MG tablet Commonly known as:  LIPITOR Take 20 mg by mouth at bedtime.   busPIRone 5 MG tablet Commonly known as:  BUSPAR Take 7.5 mg by mouth 2 (two) times daily.   cloNIDine 0.2 MG tablet Commonly known as:  CATAPRES Take 0.2 mg by mouth 2 (two) times daily.   clopidogrel 75 MG tablet Commonly known as:  PLAVIX Take 75 mg by mouth daily.    diclofenac 1.3 % Ptch Commonly known as:  FLECTOR Place 1 patch onto the skin every 12 (twelve) hours.   enoxaparin 40 MG/0.4ML injection Commonly known as:  LOVENOX Inject 0.4 mLs (40 mg total) into the skin daily.   glipiZIDE 10 MG tablet Commonly known as:  GLUCOTROL Take 10 mg by mouth 2 (two) times daily.   hydrALAZINE 50 MG tablet Commonly known as:  APRESOLINE Take 1 tablet (50 mg total) by mouth 3 (three) times daily. What changed:    medication strength  how much to take   insulin aspart 100 UNIT/ML injection Commonly known as:  novoLOG Inject 0-15 Units into the skin 3 (three) times daily with meals.   insulin detemir 100 UNIT/ML injection Commonly known as:  LEVEMIR Inject 0.15 mLs (15 Units total) into the skin 2 (two) times daily. Replaces:  LEVEMIR FLEXTOUCH 100 UNIT/ML Pen   metoprolol succinate 50 MG 24 hr tablet Commonly known as:  TOPROL-XL Take 1 tablet (50 mg total) by mouth daily. What changed:    medication strength  how much to take   RESOURCE THICKENUP CLEAR Powd Take 120 g by mouth as needed (nectar thick liquid consistency).   sodium chloride 0.9 % infusion Inject 75 mLs into the vein continuous.       LABORATORY STUDIES CBC    Component Value Date/Time   WBC 7.2 04/04/2018  0436   RBC 4.37 04/04/2018 0436   HGB 12.6 (L) 04/04/2018 0436   HCT 38.0 (L) 04/04/2018 0436   PLT 174 04/04/2018 0436   MCV 87.0 04/04/2018 0436   MCH 28.8 04/04/2018 0436   MCHC 33.2 04/04/2018 0436   RDW 11.9 04/04/2018 0436   LYMPHSABS 2.4 03/30/2018 1026   MONOABS 0.3 03/30/2018 1026   EOSABS 0.0 03/30/2018 1026   BASOSABS 0.0 03/30/2018 1026   CMP    Component Value Date/Time   NA 137 04/04/2018 0436   K 3.6 04/04/2018 0436   CL 99 04/04/2018 0436   CO2 27 04/04/2018 0436   GLUCOSE 221 (H) 04/04/2018 0436   BUN 11 04/04/2018 0436   CREATININE 1.19 04/04/2018 0436   CALCIUM 8.9 04/04/2018 0436   PROT 7.1 03/30/2018 1026   ALBUMIN 4.1  03/30/2018 1026   AST 24 03/30/2018 1026   ALT 25 03/30/2018 1026   ALKPHOS 69 03/30/2018 1026   BILITOT 0.9 03/30/2018 1026   GFRNONAA >60 04/04/2018 0436   GFRAA >60 04/04/2018 0436   COAGS Lab Results  Component Value Date   INR 1.04 03/30/2018   Lipid Panel    Component Value Date/Time   CHOL 109 03/31/2018 0223   TRIG 64 03/31/2018 0223   HDL 36 (L) 03/31/2018 0223   CHOLHDL 3.0 03/31/2018 0223   VLDL 13 03/31/2018 0223   LDLCALC 60 03/31/2018 0223   HgbA1C  Lab Results  Component Value Date   HGBA1C 7.3 (H) 03/31/2018   Urinalysis    Component Value Date/Time   COLORURINE YELLOW 04/03/2018 0849   APPEARANCEUR CLEAR 04/03/2018 0849   LABSPEC 1.015 04/03/2018 0849   PHURINE 5.0 04/03/2018 0849   GLUCOSEU >=500 (A) 04/03/2018 0849   HGBUR NEGATIVE 04/03/2018 0849   BILIRUBINUR NEGATIVE 04/03/2018 0849   KETONESUR NEGATIVE 04/03/2018 0849   PROTEINUR NEGATIVE 04/03/2018 0849   NITRITE POSITIVE (A) 04/03/2018 0849   LEUKOCYTESUR NEGATIVE 04/03/2018 0849   Urine Drug Screen     Component Value Date/Time   LABOPIA NONE DETECTED 03/31/2018 0927   COCAINSCRNUR NONE DETECTED 03/31/2018 0927   LABBENZ NONE DETECTED 03/31/2018 0927   AMPHETMU NONE DETECTED 03/31/2018 0927   THCU NONE DETECTED 03/31/2018 0927   LABBARB NONE DETECTED 03/31/2018 0927      SIGNIFICANT DIAGNOSTIC STUDIES Ct Head Code Stroke Wo Contrast 03/30/2018 1. Remote left PCA territory nonhemorrhagic infarct  2. Moderate diffuse white matter disease bilaterally.  3. No acute cortical abnormality.  4. ASPECTS is 10/10   Ct Angio Head W Or Wo Contrast Ct Angio Neck W Or Wo Contrast Ct Cerebral Perfusion W Contrast 03/30/2018  1. Large left MCA territory infarct with extremely poor collaterals.  2. Emergent large vessel occlusion of the proximal left M1 segment.  3. Moderate to high-grade segmental stenoses of the right M1 segment.  4. Moderate proximal A1 stenoses bilaterally.  5.  High-grade stenosis and occlusion of the right vertebral artery at the dural margin and V4 segment.  6. High-grade stenosis of the left vertebral artery with small irregular basilar artery feeding the residual right PCA.  7. High-grade stenosis of distal right P2 segment.  8. 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   Mr Brain Wo Contrast 03/30/2018 1. Acute nonhemorrhagic large left MCA territory infarct.  2. Remote left PCA territory infarct.  3. Advanced white matter disease consistent with chronic microvascular ischemia.  4. Vascular changes as above.  Mr Brain Wo Contrast 03/31/2018 1. Evolving large acute left MCA infarct with new cytotoxic edema and minimal petechial hemorrhage. No midline shift.  2. Large chronic left PCA infarct.  3. Severe chronic small vessel ischemic disease.  Coexistent multiple sclerosis is not excluded by imaging.   Transthoracic Echocardiogram  03/30/2018 - Left ventricle: The cavity size was normal. Wall thickness wasincreased in a pattern of moderate LVH. Systolic function wasnormal. The estimated ejection fraction was in the range of 60%to 65%. Wall motion was normal; there were no regional wallmotion abnormalities. Doppler parameters are consistent with abnormal left ventricular relaxation (grade 1 diastolicdysfunction). The Ee&' ratio is between 8-15, suggesting indeterminate LV filling pressure. - Left atrium: The atrium was normal in size. - Atrial septum: No defect or patent foramen ovale was identified. - Inferior vena cava: The vessel was normal in size. Therespirophasic diameter changes were in the normal range (>= 50%),consistent with normal central venous pressure. Impressions:  LVEF 60-65%, moderate LVH, normal wall motion, grade 1 DD,indeterminate LV filling pressure, normal LA size, normal IVC.  TEE LVEF 55-60% Thrombus noted in the left atrial appendage No PFO by  color flow Doppler or saline microcavitation study. Trivial MR and AR. Mild PR  LE venous dopplers Neg DVT bilaterally     HISTORY OF PRESENT ILLNESS Douglas [name] is a 67 y.o. male with a history of previous stroke, hypertension, diabetes, hyperlipidemia who presents with right-sided weakness and aphasia that started abruptly at 9 AM on  11/29/019.  He was in his normal state of health and came downstairs and when his eating breakfast when he suddenly stopped responding to his daughters. He is here visiting from [place] for Thanksgiving. Modified Rankin Scale: 3-Moderate disability-requires help but walks WITHOUT assistance. IV tPA was administered. He was admitted for further evaluation and treatment.    HOSPITAL COURSE Douglas [name] is a 67 y.o. male with history of previous stroke, hypertension, diabetes, hyperlipidemia who presents with right-sided weakness and aphasia. IV tPA 03/30/2018 at 11:59 AM  Stroke:  Large left MCA territory infarct - embolic pattern - unknown source, cardioembolic vs. Diffuse severe intracranial athero  Resultant  Global aphasia and right hemiplegia  CT head - Remote left PCA territory nonhemorrhagic infarct   MRI head - Acute nonhemorrhagic large left MCA territory infarct. And old left PCA infarct  CTA H&N - left M1 cut off, high grade stenosis of right M1, right P2, b/l A1, and left VA. Chronic occlusion of right V4 and left P1  Repeat CT 04/02/18 - evolution of left large MCA infarct, trace MLS  2D Echo - EF 60 - 65%. No cardiac source of emboli identified.   TEE no PFO or SOE  LE venous doppler no DVT  Pt lives in [place], may not good candidate for loop recorder here  LDL - 60  HgbA1c - 7.3  UDS - negative  clopidogrel 75 mg daily prior to admission, now on ASA 325 and plavix 75 x 3 months given significant intracranial stenosis.   Therapy recommendations:  CIR  Disposition:  CIR   Fever  One time temp 102.1   CXR ok  WBC  normal 8.0  UA + nitrates, many bacteria. No WBC  Asymptomatic   Will not treat at this time  Cerebral edema  CT and MRI showed large left MCA infarct with mild MLS  Was on 3% saline   Na 144->142->140->137  Continue IVF - NS @ 75  Hx of stroke  2013  in [place]  CT showed old left PCA territory infarct  Residue right hemiparesis, hemianopia  Could walk with walker and need some help ADLs at baseline  Hypertension  BP 140-160s past 24h  Treated with cardene in the ICU  SBP goal < 180 mmHg  Mult med adjustments during hospitalization  Now on:  norvasc 10, clonidine 0.2 bid, metoprolol 50 XL, hydralazine 50 mg 3 times daily  Continue to monitor BP  Long-term BP goal 130-150 given left M1 cut off  Hyperlipidemia  Lipid lowering medication PTA:  Lipitor 20 mg daily  LDL 60, at goal < 70  Current lipid lowering medication: lipitor 20  Continue statin at discharge  Diabetes  A1C 7.3, goal < 7.0  Uncontrolled  Home meds - Novolin R 0-12 units TID, Glipizide 10 mg BID, Levemir 30 units QAM, 15 units QAM  Current meds - Novolog 0-15 units TID, Glipizide 5 mg BID, levemir 10 QD   Glucoses 150-250s  DB RN Coord consulte  Other Stroke Risk Factors  Advanced age  Other Active Problems  Elevated Creatinine -1.80->1.22-> 1.33->1.19, on IVF   DISCHARGE EXAM Blood pressure (!) 142/78, pulse 66, temperature 98.6 F (37 C), temperature source Oral, resp. rate 15, height 6\' 2"  (1.88 m), weight 90.4 kg, SpO2 98 %. Pleasant middle-aged African-American male sitting up awake in bed, feeding self. Afebrile. Head is nontraumatic. Neck is supple without bruit.    Cardiac exam no murmur or gallop, regular rate and rhythm. Lungs arelear to auscultation. Distal pulses are well felt.  Neurological Exam :  Awake and alert. Globally aphasic, not following commands, able to have "yah" sound to all questions. Blinks to threat on the left but not the right. Left gaze  preference barely pass midline. Right lower facial weakness. Dense right hemiplegia with only trace withdrawal in the right lower extremity. Antigravity strength on the left side upper and lower extremity. Tone is diminished on the right and deep tendon reflexes are depressed on the right. No babinski. Sensation, coordination and gait not tested.  Discharge Diet  Dysphagia 2 Nectar thick liquids  DISCHARGE PLAN  Disposition:  [facility] for ongoing PT, OT and ST  aspirin 325 mg daily and clopidogrel 75 mg daily for secondary stroke prevention x 3 MONTHS then PLAVIX alone  Evaluation for further evaluation of atrial fibrillation as source of stroke recommended at d/c - loop vs 30d monitor - follow in [place] after return home  Recommend ongoing risk factor control by Primary Care Physician at time of discharge from inpatient rehabilitation.  Follow-up PCP in 2 weeks following discharge from rehab.  Follow-up in [facility] in 4 weeks following discharge from rehab, will ask office to schedule an appointment. [facility] [place] [facility] [place] [facility] up there  40 minutes were spent preparing discharge.  Annie MainSharon Biby, MSN, APRN, ANVP-BC, AGPCNP-BC Advanced Practice Stroke Nurse [facility] See Amion for Schedule & Pager information 04/04/2018 11:34 AM   ATTENDING NOTE: I reviewed above note and agree with the assessment and plan. Pt was seen and examined.   Pt daughter is at bedside. Pt lying in bed, awake alert, neuro stable, no significant change, still has global aphasia and right hemiplegia.  No fever for the last 48 hours. CXR no pneumonia and UA no UTI.  BP still on the high end, resumed hydralazine 50 mg 3 times daily.  Glucose still high, has increased Levemir to  15 units twice daily.    Will need to continue monitor BP and glucose at CIR, and adjusting medication accordingly.  PT/OT recommend CIR, will arrange  CIR admission today.  Patient will follow with [facility] stroke clinic in 4 weeks.  [name], MD PhD Stroke Neurology 04/04/2018 4:27 PM

## 2018-04-04 NOTE — Progress Notes (Signed)
Patient ID: Douglas [name], male   DOB: 06-26-1950, 67 y.o.   MRN: [id number] Admit to unit, oriented to rehab, schedule medications and plan of care.notified daughter of admission. [place] A Merline [name]

## 2018-04-05 ENCOUNTER — Inpatient Hospital Stay (HOSPITAL_COMMUNITY): Payer: Commercial Indemnity | Admitting: Physical Therapy

## 2018-04-05 ENCOUNTER — Inpatient Hospital Stay (HOSPITAL_COMMUNITY): Payer: Commercial Indemnity | Admitting: Speech Pathology

## 2018-04-05 ENCOUNTER — Inpatient Hospital Stay (HOSPITAL_COMMUNITY): Payer: Commercial Indemnity | Admitting: Occupational Therapy

## 2018-04-05 DIAGNOSIS — I63512 Cerebral infarction due to unspecified occlusion or stenosis of left middle cerebral artery: Secondary | ICD-10-CM

## 2018-04-05 DIAGNOSIS — N3090 Cystitis, unspecified without hematuria: Secondary | ICD-10-CM

## 2018-04-05 DIAGNOSIS — G8191 Hemiplegia, unspecified affecting right dominant side: Secondary | ICD-10-CM

## 2018-04-05 DIAGNOSIS — I69320 Aphasia following cerebral infarction: Secondary | ICD-10-CM

## 2018-04-05 LAB — COMPREHENSIVE METABOLIC PANEL
ALT: 18 U/L (ref 0–44)
AST: 20 U/L (ref 15–41)
Albumin: 3.1 g/dL — ABNORMAL LOW (ref 3.5–5.0)
Alkaline Phosphatase: 57 U/L (ref 38–126)
Anion gap: 12 (ref 5–15)
BUN: 15 mg/dL (ref 8–23)
CO2: 25 mmol/L (ref 22–32)
Calcium: 9 mg/dL (ref 8.9–10.3)
Chloride: 97 mmol/L — ABNORMAL LOW (ref 98–111)
Creatinine, Ser: 1.26 mg/dL — ABNORMAL HIGH (ref 0.61–1.24)
GFR calc Af Amer: >60 mL/min (ref >60)
GFR, EST NON AFRICAN AMERICAN: 59 mL/min — AB (ref >60)
Glucose, Bld: 181 mg/dL — ABNORMAL HIGH (ref 70–99)
Potassium: 3.6 mmol/L (ref 3.5–5.1)
Sodium: 134 mmol/L — ABNORMAL LOW (ref 135–145)
Total Bilirubin: 1 mg/dL (ref 0.3–1.2)
Total Protein: 6.3 g/dL — ABNORMAL LOW (ref 6.5–8.1)

## 2018-04-05 LAB — CBC WITH DIFFERENTIAL/PLATELET
Abs Immature Granulocytes: 0.02 10*3/uL (ref 0.00–0.07)
Basophils Absolute: 0 10*3/uL (ref 0.0–0.1)
Basophils Relative: 0 %
Eosinophils Absolute: 0.2 10*3/uL (ref 0.0–0.5)
Eosinophils Relative: 3 %
HCT: 37.5 % — ABNORMAL LOW (ref 39.0–52.0)
Hemoglobin: 12.5 g/dL — ABNORMAL LOW (ref 13.0–17.0)
Immature Granulocytes: 0 %
Lymphocytes Relative: 19 %
Lymphs Abs: 1.3 10*3/uL (ref 0.7–4.0)
MCH: 28.9 pg (ref 26.0–34.0)
MCHC: 33.3 g/dL (ref 30.0–36.0)
MCV: 86.6 fL (ref 80.0–100.0)
Monocytes Absolute: 0.6 10*3/uL (ref 0.1–1.0)
Monocytes Relative: 9 %
Neutro Abs: 4.8 10*3/uL (ref 1.7–7.7)
Neutrophils Relative %: 69 %
Platelets: 176 10*3/uL (ref 150–400)
RBC: 4.33 MIL/uL (ref 4.22–5.81)
RDW: 11.8 % (ref 11.5–15.5)
WBC: 6.9 10*3/uL (ref 4.0–10.5)
nRBC: 0 % (ref 0.0–0.2)

## 2018-04-05 LAB — GLUCOSE, CAPILLARY
GLUCOSE-CAPILLARY: 176 mg/dL — AB (ref 70–99)
Glucose-Capillary: 262 mg/dL — ABNORMAL HIGH (ref 70–99)
Glucose-Capillary: 265 mg/dL — ABNORMAL HIGH (ref 70–99)
Glucose-Capillary: 186 mg/dL — ABNORMAL HIGH (ref 70–99)

## 2018-04-05 MED ORDER — SULFAMETHOXAZOLE-TRIMETHOPRIM 800-160 MG PO TABS
1.0000 | ORAL_TABLET | Freq: Two times a day (BID) | ORAL | Status: DC
  Filled 2018-04-05: qty 1

## 2018-04-05 MED ORDER — SODIUM CHLORIDE 0.9 % IV BOLUS
500.0000 mL | Freq: Once | INTRAVENOUS | Status: AC
  Administered 2018-04-05: 500 mL via INTRAVENOUS

## 2018-04-05 MED ORDER — SODIUM CHLORIDE 0.9 % IV SOLN
1.0000 g | INTRAVENOUS | Status: AC
  Administered 2018-04-05 – 2018-04-12 (×8): 1 g via INTRAVENOUS
  Filled 2018-04-05 (×8): qty 10

## 2018-04-05 NOTE — Plan of Care (Signed)
  Problem: Consults Goal: RH STROKE PATIENT EDUCATION Description See Patient Education module for education specifics  Outcome: Progressing   Problem: RH BOWEL ELIMINATION Goal: RH STG MANAGE BOWEL WITH ASSISTANCE Description STG Manage Bowel with min Assistance.  Outcome: Progressing Goal: RH STG MANAGE BOWEL W/MEDICATION W/ASSISTANCE Description STG Manage Bowel with Medication with mod I Assistance.  Outcome: Progressing   Problem: RH BLADDER ELIMINATION Goal: RH STG MANAGE BLADDER WITH ASSISTANCE Description STG Manage Bladder With  Min Assistance  Outcome: Progressing   Problem: RH SAFETY Goal: RH STG ADHERE TO SAFETY PRECAUTIONS W/ASSISTANCE/DEVICE Description STG Adhere to Safety Precautions With cues/reminders supervision/Assistance/Device.  Outcome: Progressing   Problem: RH KNOWLEDGE DEFICIT Goal: RH STG INCREASE KNOWLEDGE OF DIABETES Description Daughter to be able to explain management of dm using medications na d dietary restrictions independently using handouts and resources. Dtr will be able to administer insulin independently  Outcome: Progressing Goal: RH STG INCREASE KNOWLEDGE OF HYPERTENSION Description Daughter to be able to explain management of HTN using medications na d dietary restrictions independently using handouts and resources  Outcome: Progressing Goal: RH STG INCREASE KNOWLEDGE OF DYSPHAGIA/FLUID INTAKE Description Dtr and pt will be able to demonstrate understanding of dysphagia diet restrictions and medication administration to avoid aspiration independently using handouts/resources  Outcome: Progressing Goal: RH STG INCREASE KNOWLEGDE OF HYPERLIPIDEMIA Description Daughter to be able to explain management of HLD using medications and dietary restrictions independently using handouts and resources  Outcome: Progressing Goal: RH STG INCREASE KNOWLEDGE OF STROKE PROPHYLAXIS Description Daughter to be able to explain management of  secondary stroke prevention using medications and dietary restrictions independently using handouts and resources  Outcome: Progressing

## 2018-04-05 NOTE — Care Management Note (Addendum)
Inpatient Rehabilitation Center Individual Statement of Services  Patient Name:  Douglas [name]  Date:  04/05/2018  Welcome to the Inpatient Rehabilitation Center.  Our goal is to provide you with an individualized program based on your diagnosis and situation, designed to meet your specific needs.  With this comprehensive rehabilitation program, you will be expected to participate in at least 3 hours of rehabilitation therapies Monday-Friday, with modified therapy programming on the weekends.  Your rehabilitation program will include the following services:  Physical Therapy (PT), Occupational Therapy (OT), Speech Therapy (ST), 24 hour per day rehabilitation nursing, Neuropsychology, Case Management (Social Worker), Rehabilitation Medicine, Nutrition Services and Pharmacy Services  Weekly team conferences will be held on Wednesday to discuss your progress.  Your Social Worker will talk with you frequently to get your input and to update you on team discussions.  Team conferences with you and your family in attendance may also be held.  Expected length of stay: 4 weeks  Overall anticipated outcome: min/mod level of care  Depending on your progress and recovery, your program may change. Your Social Worker will coordinate services and will keep you informed of any changes. Your Social Worker's name and contact numbers are listed  below.  The following services may also be recommended but are not provided by the Inpatient Rehabilitation Center:    Home Health Rehabiltiation Services  Outpatient Rehabilitation Services    Arrangements will be made to provide these services after discharge if needed.  Arrangements include referral to agencies that provide these services.  Your insurance has been verified to be:  Medicare Part A & B Your primary doctor [facility]:  [facility]  Pertinent information will be shared with your doctor and your insurance company.  Social Worker:  Dossie DerBecky Kiannah Grunow, SW  [phone] or [phone]) [phone]  Information discussed with and copy given to patient by: Lucy Chrisupree, Shamari Trostel G, 04/05/2018, 9:33 AM

## 2018-04-05 NOTE — Discharge Instructions (Signed)
Inpatient Rehab Discharge Instructions  Douglas CordiaWilliam L Fjelstad [name] Discharge date and time: No discharge date for patient encounter.   Activities/Precautions/ Functional Status: Activity: activity as tolerated Diet:  Wound Care: none needed Functional status:  ___ No restrictions     ___ Walk up steps independently ___ 24/7 supervision/assistance   ___ Walk up steps with assistance ___ Intermittent supervision/assistance  ___ Bathe/dress independently ___ Walk with walker     _x STROKE/TIA DISCHARGE INSTRUCTIONS SMOKING Cigarette smoking nearly doubles your risk of having a stroke & is the single most alterable risk factor  If you smoke or have smoked in the last 12 months, you are advised to quit smoking for your health.  Most of the excess cardiovascular risk related to smoking disappears within a year of stopping.  Ask you doctor about anti-smoking medications  [place] Quit Line: 1-800-QUIT NOW  Free Smoking Cessation Classes [phone]  CHOLESTEROL Know your levels; limit fat & cholesterol in your diet  Lipid Panel     Component Value Date/Time   CHOL 109 03/31/2018 0223   TRIG 64 03/31/2018 0223   HDL 36 (L) 03/31/2018 0223   CHOLHDL 3.0 03/31/2018 0223   VLDL 13 03/31/2018 0223   LDLCALC 60 03/31/2018 0223      Many patients benefit from treatment even if their cholesterol is at goal.  Goal: Total Cholesterol (CHOL) less than 160  Goal:  Triglycerides (TRIG) less than 150  Goal:  HDL greater than 40  Goal:  LDL (LDLCALC) less than 100   BLOOD PRESSURE American Stroke Association blood pressure target is less that 120/80 mm/Hg  Your discharge blood pressure is:  BP: 132/75  Monitor your blood pressure  Limit your salt and alcohol intake  Many individuals will require more than one medication for high blood pressure  DIABETES (A1c is a blood sugar average for last 3 months) Goal HGBA1c is under 7% (HBGA1c is blood sugar average for last 3 months)  Diabetes:      Lab Results  Component Value Date   HGBA1C 7.3 (H) 03/31/2018     Your HGBA1c can be lowered with medications, healthy diet, and exercise.  Check your blood sugar as directed by your physician  Call your physician if you experience unexplained or low blood sugars.  PHYSICAL ACTIVITY/REHABILITATION Goal is 30 minutes at least 4 days per week  Activity: Increase activity slowly, Therapies: Physical Therapy: Home Health Return to work:   Activity decreases your risk of heart attack and stroke and makes your heart stronger.  It helps control your weight and blood pressure; helps you relax and can improve your mood.  Participate in a regular exercise program.  Talk with your doctor about the best form of exercise for you (dancing, walking, swimming, cycling).  DIET/WEIGHT Goal is to maintain a healthy weight  Your discharge diet is:  Diet Order            DIET DYS 2 Room service appropriate? Yes; Fluid consistency: Nectar Thick  Diet effective now              liquids Your height is:  Height: 5\' 10"  (177.8 cm) Your current weight is: Weight: 88.9 kg Your Body Mass Index (BMI) is:  BMI (Calculated): 28.12  Following the type of diet specifically designed for you will help prevent another stroke.  Your goal weight range is:    Your goal Body Mass Index (BMI) is 19-24.  Healthy food habits can help reduce 3 risk  factors for stroke:  High cholesterol, hypertension, and excess weight.  RESOURCES Stroke/Support Group:  Call [phone]   STROKE EDUCATION PROVIDED/REVIEWED AND GIVEN TO PATIENT Stroke warning signs and symptoms How to activate emergency medical system (call 911). Medications prescribed at discharge. Need for follow-up after discharge. Personal risk factors for stroke. Pneumonia vaccine given:  Flu vaccine given:  My questions have been answered, the writing is legible, and I understand these instructions.  I will adhere to these goals & educational materials  that have been provided to me after my discharge from the hospital.   __ Bathe/dress with assistance ___ Walk Independently    ___ Shower independently ___ Walk with assistance    ___ Shower with assistance ___ No alcohol     ___ Return to work/school ________  Special Instructions:    My questions have been answered and I understand these instructions. I will adhere to these goals and the provided educational materials after my discharge from the hospital.  Patient/Caregiver Signature _______________________________ Date __________  Clinician Signature _______________________________________ Date __________  Please bring this form and your medication list with you to all your follow-up doctor's appointments.

## 2018-04-05 NOTE — Evaluation (Addendum)
Physical Therapy Assessment and Plan  Patient Details  Name: Douglas [name] MRN: [id number] Date of Birth: 1950-10-24  PT Diagnosis: Abnormal posture, Abnormality of gait, Cognitive deficits, Coordination disorder, Hemiplegia non-dominant, Impaired cognition, Impaired sensation and Muscle weakness Rehab Potential: Good ELOS: 4 weeks   Today's Date: 04/05/2018 PT Individual Time: 1660-6301 PT Individual Time Calculation (min): 60 min    Problem List:  Patient Active Problem List   Diagnosis Date Noted  . Cerebral edema ([place]) 04/04/2018  . Renal insufficiency 04/04/2018  . 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 Acute lower UTI   . Type 2 diabetes mellitus with peripheral neuropathy (HCC)   . Acute embolic stroke ([place])   . Diabetes 1.5, managed as type 2 ([place])   . Essential hypertension   . Dyslipidemia   . History of CVA with residual deficit   . Dysphagia, post-stroke     Past Medical History:  Past Medical History:  Diagnosis Date  . Diabetes mellitus without complication ([place])   . Hypertension   . Stroke [facility])    Past Surgical History:  Past Surgical History:  Procedure Laterality Date  . IR PATIENT EVAL TECH 0-60 MINS  03/30/2018  . TEE WITHOUT CARDIOVERSION N/A 04/02/2018   Procedure: TRANSESOPHAGEAL ECHOCARDIOGRAM (TEE);  Surgeon: Skeet Latch, MD;  Location: [place];  Service: Cardiovascular;  Laterality: N/A;    Assessment & Plan Clinical Impression: Patient is a 67 year old right-handed male with history of hypertension, diabetes mellitus, hyperlipidemia and 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 Per chart review and daughter, patient lives alone independent with assistive device prior to admission. Patient lives in [place] and was visiting family in the area. Presented 03/30/2018 with increasing speech difficulty and limited mobility. Cranial CT scan showed remote left PCA territory  nonhemorrhagic infarction. CT angiogram of head and neck showed large left MCA territory infarction with extremely poor collaterals. Emergent large vessel occlusion proximal left M1 segment.Patient was initially maintained on 3% saline for a short time. Echocardiogram with ejection fraction of 65% without emboli. Follow-up MRI reviewed showing large MCA infarction. Dysphagia #2 nectar thick liquid diet.TEE completed 04/02/2018 ejection fraction 65% thrombus noted in the left atrial appendage no PFO. Patient currently maintained on aspirin and Plavix for CVA prophylaxis. Subcutaneous Lovenox for DVT prophylaxis. Patient spiked a low-grade fever 04/03/2018 with chest x-ray negative and urinalysis positive nitrite.Cultures and sensitivities pending. Therapy evaluations completed with recommendations of physical medicine rehabilitation consult. Patient transferred to CIR on 04/04/2018 .   Patient currently requires total with mobility secondary to muscle weakness, decreased cardiorespiratoy endurance, unbalanced muscle activation, decreased coordination and decreased motor planning, decreased attention to right, decreased initiation, decreased attention, decreased awareness, decreased problem solving, decreased safety awareness, decreased memory and delayed processing and decreased sitting balance, decreased standing balance, decreased postural control, hemiplegia and decreased balance strategies.  Prior to hospitalization, patient was modified independent  with mobility and lived with Daughter(daughter planning to take him home to live with her in a level entry apartment or house) in a [place] home.  Home access is  Level entry.  Patient will benefit from skilled PT intervention to maximize safe functional mobility, minimize fall risk and decrease caregiver burden for planned discharge home with 24 hour assist.  Anticipate patient will benefit from follow up [facility] at discharge.  PT - End of Session Activity  Tolerance: Tolerates < 10 min activity, no significant change in vital signs Endurance Deficit: Yes Endurance Deficit Description:  lethargic this session, unsure if endurance related or 2/2 medical status PT Assessment Rehab Potential (ACUTE/IP ONLY): Good PT Barriers to Discharge: Incontinence;Medical stability;Decreased caregiver support PT Patient demonstrates impairments in the following area(s): Balance;Behavior;Endurance;Motor;Perception;Safety;Sensory PT Transfers Functional Problem(s): Bed Mobility;Bed to Chair;Furniture;Car;Floor PT Locomotion Functional Problem(s): Stairs;Wheelchair Mobility;Ambulation PT Plan PT Intensity: Minimum of 1-2 x/day ,45 to 90 minutes PT Frequency: 5 out of 7 days PT Duration Estimated Length of Stay: 4 weeks PT Treatment/Interventions: Disease management/prevention;Ambulation/gait training;Pain management;Stair training;Visual/perceptual remediation/compensation;Balance/vestibular training;DME/adaptive equipment instruction;Patient/family education;Therapeutic Activities;Wheelchair propulsion/positioning;Therapeutic Exercise;Psychosocial support;Functional electrical stimulation;Cognitive remediation/compensation;Community reintegration;Functional mobility training;Skin care/wound management;UE/LE Strength taining/ROM;UE/LE Coordination activities;Splinting/orthotics;Neuromuscular re-education;Discharge planning PT Transfers Anticipated Outcome(s): min assist PT Locomotion Anticipated Outcome(s): min assist short distance gait, supervision w/c mobility  PT Recommendation Follow Up Recommendations: Home health PT Patient destination: Home(home w/ daughter) Equipment Recommended: To be determined Equipment Details: has rollator   Skilled Therapeutic Intervention  Pt asleep upon arrival and unable to open eyes more than a few seconds when responding to name and vestibular stimulation. Performed bed mobility w/ total assist 2/2 lethargy and required mod  assist to maintain static sitting balance at EOB. Made multiple attempts to stand to stedy, ultimately required max assist +2. Transferred to TIS w/c via stedy, minimal increase in arousal while in w/c. Adjusted w/c to pt's height and size for positioning and pressure relief. Instructed daughter on d/c parts management including tilt function and brakes so she can utilize w/c to work on pt's arousal. Deferred gait and further standing activity 2/2 lethargy level. Instructed daughter in results of PT evaluation as detailed below, PT POC, rehab potential, rehab goals, and discharge recommendations. Additionally discussed CIR's policies regarding fall safety and use of chair alarm and/or quick release belt. Daughter verbalized understanding and in agreement. Ended session tilted in TIS and in care of daughter, all needs in reach.  PT Evaluation Precautions/Restrictions Precautions Precautions: Fall Precaution Comments: dense R hemi Restrictions Weight Bearing Restrictions: No Pain Pain Assessment Pain Scale: Faces Pain Score: 0-No pain Faces Pain Scale: No hurt Home Living/Prior Functioning Home Living Available Help at Discharge: Family;Available 24 hours/day Type of Home: Apartment Home Access: Level entry Home Layout: One level  Lives With: Daughter(daughter planning to take him home to live with her in a level entry apartment or house) Prior Function Level of Independence: Requires assistive device for independence;Independent with basic ADLs;Independent with gait;Independent with transfers Comments: per family, pt uses rollator to ambulate independently, does all his basic ADLs,  the facility does meds and meals. Vision/Perception  Perception Perception: Impaired Inattention/Neglect: Does not attend to right side of body, Does not attend to R visual field Praxis: Impaired motor planning Cognition Overall Cognitive Status: Impaired/Different from baseline Arousal/Alertness:  Lethargic Orientation Level: Other (comment)(unable to assess 2/2 lethargy) Awareness: Impaired(could be 2/2 lethargy) Safety/Judgment: Impaired Comments: Pt very lethargic during session, unable to formally assess cognition Sensation Sensation Light Touch: (unable to assess 2/2 cognition, pt not responding to touch of affected extremities) Coordination Gross Motor Movements are Fluid and Coordinated: No Fine Motor Movements are Fluid and Coordinated: No Motor  Motor Motor: Hemiplegia, abnormal tone Motor - Skilled Clinical Observations: Dense R hemi   Mobility Bed Mobility Bed Mobility: Rolling Right;Rolling Left;Supine to Sit;Sit to Supine Rolling Right: Total Assistance - Patient < 25% Rolling Left: Total Assistance - Patient < 25% Supine to Sit: Total Assistance - Patient < 25% Sit to Supine: Total Assistance - Patient < 25% Transfers Transfers: Sit to Stand;Stand to Sit;Stand Pivot Transfers Sit to Stand:  2 Helpers Stand to Sit: 2 Teacher, music Transfers: 2 Press photographer via Geophysicist/field seismologist: Animal nutritionist: No Gait Gait: No Stairs / Scientist, research (life sciences): No Architect: No  Trunk/Postural Assessment  Cervical Assessment Cervical Assessment: Exceptions to WFL(forward head) Thoracic Assessment Thoracic Assessment: Exceptions to WFL(rounded shoulders) Lumbar Assessment Lumbar Assessment: Exceptions to WFL(posterior pelvic tilt) Postural Control Postural Control: Deficits on evaluation(Moderate pushing to R in static sitting, absent righting reactions)  Balance Balance Balance Assessed: Yes Static Sitting Balance Static Sitting - Balance Support: Left upper extremity supported;Feet supported Static Sitting - Level of Assistance: 3: Mod assist Static Standing Balance Static Standing - Balance Support: Left upper extremity supported;During functional activity Static Standing - Level of Assistance: 2:  Max assist Extremity Assessment  RLE Assessment RLE Assessment: Exceptions to [facility] Passive Range of Motion (PROM) Comments: [facility] General Strength Comments: 0/5 throughout (difficult to assess 2/2 lethargy, motor planning impairments, and R inattention) LLE Assessment LLE Assessment: Within Functional Limits Passive Range of Motion (PROM) Comments: WFL General Strength Comments: (unable to formally assess 2/2 cognition/lethargy, pt moving extremity against gravity throughout session, suspect WFL)    Refer to Care Plan for Long Term Goals  Recommendations for other services: None   Discharge Criteria: Patient will be discharged from PT if patient refuses treatment 3 consecutive times without medical reason, if treatment goals not met, if there is a change in medical status, if patient makes no progress towards goals or if patient is discharged from hospital.  The above assessment, treatment plan, treatment alternatives and goals were discussed and mutually agreed upon: by patient and by family  Norwin Aleman K Patches Mcdonnell 04/05/2018, 10:29 AM

## 2018-04-05 NOTE — Evaluation (Signed)
Occupational Therapy Assessment and Plan  Patient Details  Name: Douglas [name] MRN: [id number] Date of Birth: Sep 10, 1950  OT Diagnosis: abnormal posture, apraxia, cognitive deficits, disturbance of vision, flaccid hemiplegia and hemiparesis, hemiplegia affecting dominant side and muscle weakness (generalized) Rehab Potential: Rehab Potential (ACUTE ONLY): Fair ELOS: 4 weeks   Today's Date: 04/05/2018 OT Individual Time: 0867-6195 OT Individual Time Calculation (min): 75 min     Problem List:  Patient Active Problem List   Diagnosis Date Noted  . Cerebral edema ([place]) 04/04/2018  . Renal insufficiency 04/04/2018  . 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 Acute lower UTI   . Type 2 diabetes mellitus with peripheral neuropathy (HCC)   . Acute embolic stroke ([place])   . Diabetes 1.5, managed as type 2 ([place])   . Essential hypertension   . Dyslipidemia   . History of CVA with residual deficit   . Dysphagia, post-stroke     Past Medical History:  Past Medical History:  Diagnosis Date  . Diabetes mellitus without complication ([place])   . Hypertension   . Stroke [facility])    Past Surgical History:  Past Surgical History:  Procedure Laterality Date  . IR PATIENT EVAL TECH 0-60 MINS  03/30/2018  . TEE WITHOUT CARDIOVERSION N/A 04/02/2018   Procedure: TRANSESOPHAGEAL ECHOCARDIOGRAM (TEE);  Surgeon: Skeet Latch, MD;  Location: [facility] ENDOSCOPY;  Service: Cardiovascular;  Laterality: N/A;    Assessment & Plan Clinical Impression:Douglas [name] is a 67 year old right-handed male with history of hypertension, diabetes mellitus, hyperlipidemia and 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 Per chart review and daughter, patient lives alone independent with assistive device prior to admission. Patient lives in [place] and was visiting family in the area. Presented 03/30/2018 with increasing speech difficulty and limited mobility. Cranial CT  scan showed remote left PCA territory nonhemorrhagic infarction. CT angiogram of head and neck showed large left MCA territory infarction with extremely poor collaterals. Emergent large vessel occlusion proximal left M1 segment.Patient was initially maintained on 3% saline for a short time. Echocardiogram with ejection fraction of 65% without emboli. Follow-up MRI reviewed showing large MCA infarction. Dysphagia #2 nectar thick liquid diet.TEE completed 04/02/2018 ejection fraction 65% thrombus noted in the left atrial appendage no PFO. Patient currently maintained on aspirin and Plavix for CVA prophylaxis. Subcutaneous Lovenox for DVT prophylaxis. Patient spiked a low-grade fever 04/03/2018 with chest x-ray negative and urinalysis positive nitrite.Cultures and sensitivities pending. Therapy evaluations completed with recommendations of physical medicine rehabilitation consult. Patient was admitted for a comprehensive rehabilitation program. Patient transferred to CIR on 04/04/2018 .    Patient currently requires total +2 with basic self-care skills secondary to muscle weakness, decreased cardiorespiratoy endurance, abnormal tone, motor apraxia, decreased coordination and decreased motor planning, decreased midline orientation, decreased attention to right, right side neglect and decreased motor planning, decreased attention, decreased awareness, decreased problem solving, decreased safety awareness, decreased memory and delayed processing and decreased sitting balance, decreased standing balance, decreased postural control, hemiplegia and decreased balance strategies.  Prior to hospitalization, patient could complete ADLs mod I with RW living in independent living at ALF with assist for meal and medication management. Patient will benefit from skilled intervention to decrease level of assist with basic self-care skills prior to discharge home with care partner.  Anticipate patient will require moderate physical  assestance and follow up home health.  OT - End of Session Activity Tolerance: Tolerates 10 - 20 min activity with multiple rests  Endurance Deficit: Yes OT Assessment Rehab Potential (ACUTE ONLY): Fair OT Barriers to Discharge: Incontinence OT Patient demonstrates impairments in the following area(s): Balance;Perception;Safety;Cognition;Sensory;Endurance;Motor;Vision OT Basic ADL's Functional Problem(s): Eating;Grooming;Bathing;Dressing;Toileting OT Transfers Functional Problem(s): Toilet OT Additional Impairment(s): Fuctional Use of Upper Extremity OT Plan OT Intensity: Minimum of 1-2 x/day, 45 to 90 minutes OT Frequency: 5 out of 7 days OT Duration/Estimated Length of Stay: 4 weeks OT Treatment/Interventions: Balance/vestibular training;Discharge planning;Functional electrical stimulation;Pain management;Self Care/advanced ADL retraining;Therapeutic Activities;UE/LE Coordination activities;Therapeutic Exercise;Visual/perceptual remediation/compensation;Patient/family education;Functional mobility training;Disease mangement/prevention;Cognitive remediation/compensation;DME/adaptive equipment instruction;Neuromuscular re-education;Psychosocial support;Splinting/orthotics;UE/LE Strength taining/ROM;Wheelchair propulsion/positioning;Skin care/wound managment OT Self Feeding Anticipated Outcome(s): Set-up/supervision OT Basic Self-Care Anticipated Outcome(s): Min-mod A OT Toileting Anticipated Outcome(s): Mod A OT Bathroom Transfers Anticipated Outcome(s): Min A to toilet/BSC OT Recommendation Patient destination: Home Follow Up Recommendations: Home health OT Equipment Recommended: To be determined   Skilled Therapeutic Intervention Pt seen for OT eval and ADL bathing/dressing session. Pt in tilt-in-space w/c upon arrival with family present. Pt alert and in no signs of distress. He required +2 total A for all functional mobility including stand pivot transfer to [facility] over toilet, sliding  board transfer w/c <> EOM in therapy gym and with use of STEDY when transferring from toilet to [facility] in shower for bathing task.  Pt initially requiring mod A for sitting balance on BSC over toilet 2/2 pushing tendencies, however, progressing to close supervision.  Required max multi-modal cuing for bathing tasks in shower, ultimately +2 total A for bathing as pt unable to attend to R side of body and apraxia limiting pt's ability to self bathe.  He dressed seated in w/c able to thread L UE into shirt with cuing, max A for remainder of shirt. +2 total A to don pants and stood 3-muskateer style to have pants pulled up. Pt unable to obtain erect standing posture, using L LE in non-functional manner, pushing against therapist's assis 2/2 pushing tendencies. In therapy gym, sliding board transfer to EOM. Addressed dynamic sitting balance and motor planning during dynamic sitting balance, mod-max A overall for sitting balance. Pt returned to room at end of session, left in tilt-in-space w/c with family present. Education provided to family throughout session regarding role of OT, POC, OT goals, and d/c planning.   OT Evaluation Precautions/Restrictions  Precautions Precautions: Fall Precaution Comments: dense R hemi Restrictions Weight Bearing Restrictions: No General Chart Reviewed: Yes Additional Pertinent History: hx L CVA with minimal residual weakness per family Home Living/Prior [place] expects to be discharged to:: Private residence Living Arrangements: Alone Available Help at Discharge: Family, Available 24 hours/day Type of Home: Apartment Home Access: Level entry Home Layout: One level Additional Comments: Lived in independent living at ALF PTA. Plan to d/c with daughter to apartment in [place] With: Alone IADL History Homemaking Responsibilities: No Current License: No Prior Function Level of Independence: Requires assistive device for  independence, Independent with basic ADLs, Independent with gait, Independent with transfers Driving: No Vocation: Retired Comments: per family, pt uses rollator to ambulate independently, does all his basic ADLs,  the facility does meds and meals. Vision Baseline Vision/History: Wears glasses Wears Glasses: At all times(Per daughter, pt's glassess are in [facility] so no access to these during tx) Patient Visual Report: Other (comment)(Unable to formally assess 2/2 cognitive and communication impairments) Vision Assessment?: Vision impaired- to be further tested in functional context Tracking/Visual Pursuits: Requires cues, head turns, or add eye shifts to track;Other (comment);Impaired - to be further tested in functional context Additional  Comments: Max-total A cuing to scan to R for head turn. Did not oserve pt's eyes tracking past midline to R Perception  Perception: Impaired Inattention/Neglect: Does not attend to right side of body;Does not attend to right visual field Praxis Praxis: Impaired Praxis Impairment Details: Motor planning;Ideomotor;Perseveration Cognition Overall Cognitive Status: Impaired/Different from baseline Arousal/Alertness: Lethargic Orientation Level: Nonverbal/unable to assess Memory: Impaired Attention: Sustained Focused Attention: Impaired Sustained Attention: Impaired Sustained Attention Impairment: Verbal basic;Functional basic Awareness: Impaired Awareness Impairment: Intellectual impairment Problem Solving: Impaired Problem Solving Impairment: Functional basic Executive Function: Sequencing Sequencing: Impaired Sequencing Impairment: Functional basic Safety/Judgment: Impaired Comments: R inattention, impaired midline orientation Sensation Sensation Light Touch: Impaired Detail Light Touch Impaired Details: Absent RUE;Absent RLE Proprioception: Impaired Detail Proprioception Impaired Details: Absent RUE;Absent RLE Stereognosis: Impaired by gross  assessment Coordination Gross Motor Movements are Fluid and Coordinated: No Fine Motor Movements are Fluid and Coordinated: No Coordination and Movement Description: Observed flaccid R UE. Unable to attend to R side of body for muscle activation.  Finger Nose Finger Test: Unable to follow commands to formally assess; flaccid R UE Motor  Motor Motor: Hemiplegia;Abnormal tone;Motor apraxia Motor - Skilled Clinical Observations: Dense R hemi  Trunk/Postural Assessment  Cervical Assessment Cervical Assessment: Exceptions to WFL(Forward head) Thoracic Assessment Thoracic Assessment: Exceptions to WFL(Rounded shoulders; kyphotic) Lumbar Assessment Lumbar Assessment: Exceptions to WFL(Posterior pelvic tilt; pelvic obliquity 2/2 midline orientation deficits) Postural Control Postural Control: Deficits on evaluation(Pusher; absent righting reactions in unsupported sitting)  Balance Balance Balance Assessed: Yes Static Sitting Balance Static Sitting - Balance Support: Left upper extremity supported;Feet supported Static Sitting - Level of Assistance: 2: Max assist;4: Min assist Static Sitting - Comment/# of Minutes: Sitting EOM, initially max A progressing to guarding assist with multi-modal cuing Dynamic Sitting Balance Dynamic Sitting - Balance Support: During functional activity;Feet supported Dynamic Sitting - Level of Assistance: 2: Max assist Sitting balance - Comments: Sitting on EOM completing functional reaching tasks Static Standing Balance Static Standing - Balance Support: During functional activity;Left upper extremity supported Static Standing - Level of Assistance: 1: +2 Total assist Static Standing - Comment/# of Minutes: Standing 3 muskateer style in order to pull up pants. Pushing tendencies with L LE Dynamic Standing Balance Dynamic Standing - Balance Support: During functional activity Dynamic Standing - Level of Assistance: 1: +2 Total assist Dynamic Standing -  Comments: Standing 3 muskateer style in order to pull up pants. Pushing tendencies with L LE Extremity/Trunk Assessment RUE Assessment RUE Assessment: Exceptions to [facility] General Strength Comments: Observe 0/5. Unable to formally assess 2/2 cognitive deficits and severe R inattention RUE Body System: Neuro Brunstrum levels for arm and hand: Arm;Hand Brunstrum level for hand: Stage I Flaccidity LUE Assessment LUE Assessment: Exceptions to [facility] General Strength Comments: ROM observed to be [facility]. Impaired functional use 2/2 apraxia     Refer to Care Plan for Long Term Goals  Recommendations for other services: None    Discharge Criteria: Patient will be discharged from OT if patient refuses treatment 3 consecutive times without medical reason, if treatment goals not met, if there is a change in medical status, if patient makes no progress towards goals or if patient is discharged from hospital.  The above assessment, treatment plan, treatment alternatives and goals were discussed and mutually agreed upon: by patient and by family  Briena Swingler L 04/05/2018, 5:49 PM

## 2018-04-05 NOTE — Progress Notes (Signed)
Inpatient Rehabilitation  Patient information reviewed and entered into eRehab system by [facility] M. Karen KaysBowie, M.A., CCC/SLP, PPS Coordinator.  Information including medical coding, functional ability and quality indicators will be reviewed and updated through discharge.    Per nursing patient was given "Data Collection Information Summary" for Patients in Inpatient Rehabilitation Facilities with attached "Privacy Act Statement-Health Care Records" upon admission provided in education notebook.

## 2018-04-05 NOTE — Progress Notes (Signed)
[place] PHYSICAL MEDICINE & REHABILITATION PROGRESS NOTE   Subjective/Complaints: Lethargy noted after breakfast, was able to eat nd was interactive per daughter and LPN.  No coughing reported, Slept well last noc   Objective:   Dg Chest Port 1 View  Result Date: 04/03/2018 CLINICAL DATA:  Fevers EXAM: PORTABLE CHEST 1 VIEW COMPARISON:  None. FINDINGS: The heart size and mediastinal contours are within normal limits. Both lungs are clear. The visualized skeletal structures are unremarkable. IMPRESSION: No active disease. Electronically Signed   By: Alcide CleverMark  Lukens M.D.   On: 04/03/2018 09:50   Recent Labs    04/04/18 1545 04/05/18 0627  WBC 6.7 6.9  HGB 12.1* 12.5*  HCT 37.8* 37.5*  PLT 176 176   Recent Labs    04/04/18 0436 04/04/18 1545 04/05/18 0627  NA 137  --  134*  K 3.6  --  3.6  CL 99  --  97*  CO2 27  --  25  GLUCOSE 221*  --  181*  BUN 11  --  15  CREATININE 1.19 1.28* 1.26*  CALCIUM 8.9  --  9.0    Intake/Output Summary (Last 24 hours) at 04/05/2018 [id number] Last data filed at 04/05/2018 0530 Gross per 24 hour  Intake 360 ml  Output 800 ml  Net -440 ml     Physical Exam: Vital Signs Blood pressure 132/75, pulse 62, temperature 98 F (36.7 C), resp. rate 15, height 5\' 10"  (1.778 m), weight 88.9 kg, SpO2 99 %.    General: No acute distress Mood and affect lethargy Heart: Regular rate and rhythm no rubs murmurs or extra sounds Lungs: Clear to auscultation, breathing unlabored, no rales or wheezes Abdomen: Positive bowel sounds, soft nontender to palpation, nondistended Extremities: No clubbing, cyanosis, or edema Skin: No evidence of breakdown, no evidence of rash Neurologic: lethargic no grip on right moderate grip on left , some antigravity on Left flaccid RLESensory exam normal sensation to light touch and proprioception in bilateral upper and lower extremities Cerebellar exam cannot assess lethargy Musculoskeletal: no pain with shoulder or LE  ROM  Assessment/Plan: 1. Functional deficits secondary to Left MCA infarct R HP and Aphasia which require 3+ hours per day of interdisciplinary therapy in a comprehensive inpatient rehab setting.  Physiatrist is providing close team supervision and 24 hour management of active medical problems listed below.  Physiatrist and rehab team continue to assess barriers to discharge/monitor patient progress toward functional and medical goals  Care Tool:  Bathing              Bathing assist       Upper Body Dressing/Undressing Upper body dressing        Upper body assist      Lower Body Dressing/Undressing Lower body dressing            Lower body assist       Toileting Toileting    Toileting assist Assist for toileting: Moderate Assistance - Patient 50 - 74%     Transfers Chair/bed transfer  Transfers assist           Locomotion Ambulation   Ambulation assist              Walk 10 feet activity   Assist           Walk 50 feet activity   Assist           Walk 150 feet activity   Assist  Walk 10 feet on uneven surface  activity   Assist           Wheelchair     Assist               Wheelchair 50 feet with 2 turns activity    Assist            Wheelchair 150 feet activity     Assist           Medical Problem List and Plan: 1.  Right side weakness with aphasia/dysphagia secondary to large left MCA infarction as well as history of CVA 2013.aspirin and Plavix 3 months CIR eval PT, OT, SLP 2.  DVT Prophylaxis/Anticoagulation: subcutaneous Lovenox. Monitor for any bleeding episodes 3. Pain Management:  Tylenol as needed 4. Mood: BuSpar 7.5 mg twice a day, Provide emotional support 5. Neuropsych: This patient is capable of making decisions on his own behalf. 6. Skin/Wound Care:  Routine skin checks 7. Fluids/Electrolytes/Nutrition:  Routine in and out's with follow-up chemistries  poor intake , increased creat , start IVF  8.Hypertension. Norvasc 10 mg daily, clonidine 0.2 mg daily, Toprol-XL 50 mg daily Vitals:   04/04/18 1951 04/05/18 0529  BP: (!) 152/79 132/75  Pulse: 72 62  Resp: 17 15  Temp: (!) 89.9 F (32.2 C) 98 F (36.7 C)  SpO2: 100% 99%  Controlled but will check orthostatics 9. Dysphagia. Dysphagia #2 nectar thick liquids. Follow-up speech therapy 10. Diabetes mellitus with peripheral neuropathy. Hemoglobin A1c 7.3. Glucotrol 5 mg twice a day,Levemir 15 units twice a day. Check blood sugars before meals and at bedtime. Diabetic teaching CBG (last 3)  Recent Labs    04/04/18 1652 04/04/18 2142 04/05/18 0634  GLUCAP 255* 177* 176*  monitor for now 11. Hyperlipidemia. Lipitor 12.Suspect UTI. Urine study positive nitrites cultures and sensitivities pending.Begin empiric coverage- change to Rocephin due to poor intake,CBC ok  LOS: 1 days A FACE TO FACE EVALUATION WAS PERFORMED  Erick Colace 04/05/2018, 8:33 AM

## 2018-04-05 NOTE — Evaluation (Signed)
Speech Language Pathology Assessment and Plan  Patient Details  Name: Douglas [name] MRN: [id number] Date of Birth: 11-27-1950  SLP Diagnosis: Aphasia;Apraxia;Dysphagia;Cognitive Impairments  Rehab Potential: Good ELOS: 28 days     Today's Date: 04/05/2018 SLP Individual Time: 1010-1105 SLP Individual Time Calculation (min): 55 min   Problem List:  Patient Active Problem List   Diagnosis Date Noted  . Cerebral edema ([place]) 04/04/2018  . Renal insufficiency 04/04/2018  . Left middle cerebral artery stroke ([place]) 04/04/2018  . Acute lower UTI   . Type 2 diabetes mellitus with peripheral neuropathy (HCC)   . Acute embolic stroke ([place])   . Diabetes 1.5, managed as type 2 ([place])   . Essential hypertension   . Dyslipidemia   . History of CVA with residual deficit   . Dysphagia, post-stroke    Past Medical History:  Past Medical History:  Diagnosis Date  . Diabetes mellitus without complication ([place])   . Hypertension   . Stroke [facility])    Past Surgical History:  Past Surgical History:  Procedure Laterality Date  . IR PATIENT EVAL TECH 0-60 MINS  03/30/2018  . TEE WITHOUT CARDIOVERSION N/A 04/02/2018   Procedure: TRANSESOPHAGEAL ECHOCARDIOGRAM (TEE);  Surgeon: Skeet Latch, MD;  Location: [facility] ENDOSCOPY;  Service: Cardiovascular;  Laterality: N/A;    Assessment / Plan / Recommendation Clinical Impression   Douglas [name] is a 67 year old right-handed male with history of hypertension, diabetes mellitus, hyperlipidemia and 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 Per chart review and daughter, patient lives alone independent with assistive device prior to admission. Patient lives in [place] and was visiting family in the area. Presented 03/30/2018 with increasing speech difficulty and limited mobility. Cranial CT scan showed remote left PCA territory nonhemorrhagic infarction. CT angiogram of head and neck showed large left MCA territory  infarction with extremely poor collaterals. Emergent large vessel occlusion proximal left M1 segment.Patient was initially maintained on 3% saline for a short time. Echocardiogram with ejection fraction of 65% without emboli. Follow-up MRI reviewed showing large MCA infarction. Dysphagia #2 nectar thick liquid diet.TEE completed 04/02/2018 ejection fraction 65% thrombus noted in the left atrial appendage no PFO. Patient currently maintained on aspirin and Plavix for CVA prophylaxis. Subcutaneous Lovenox for DVT prophylaxis. Patient spiked a low-grade fever 04/03/2018 with chest x-ray negative and urinalysis positive nitrite.Cultures and sensitivities pending. Therapy evaluations completed with recommendations of physical medicine rehabilitation consult. Patient was admitted for a comprehensive rehabilitation program on 04/04/2018.  SLP evaluation was completed on 04/05/2018 with the following results:  Evaluation was limited today secondary to lethargy.  Pt presents with immediate coughing following initial trials of ice chips which appeared to be related to impaired timing of swallow response in the setting of decreased oral motor control.  Coughing subsided as task progressed and pt tolerated trials of thin liquids via cup without overt s/s of aspiration.  Pt also appeared to tolerate dys 2 textures and nectar thick liquids without overt s/s of aspiration.  Pt has significant anterior spillage and prolonged oral transit of all boluses from the oral cavity due to decreased labial seal and moderately severe right sided oral motor deficits for which he needs mod assist to self monitor and correct.   Pt also presents with global aphasia and apraxia.  He was minimally verbal today and output was limited to sporadic yes/no responses.  His yes/no response accuracy and his ability to follow 1 step commands is decreased to <50% despite max  multimodal cues to improve accuracy.  These appear to be influenced at least in part  by motor planning impairment.   Pt also moderately severe cognitive deficits characterized by right inattention, decreased intellectual awareness of deficits, decreased functional problem solving, and decreased sustained attention to tasks.   As a result, pt would benefit from skilled ST while inpatient in order to maximize functional independence and reduce burden of care prior to discharge.  Anticipate that pt will need 24/7 supervision at discharge in addition to Combine follow up at next level of care.   Discussed recommendations with daughter who was in agreement with plan of care.    Skilled Therapeutic Interventions          Cognitive-linguistic evaluation completed with results and recommendations reviewed with family.     SLP Assessment  Patient will need skilled Speech Lanaguage Pathology Services during CIR admission    Recommendations  SLP Diet Recommendations: Dysphagia 2 (Fine chop);Nectar Liquid Administration via: Cup;Straw Medication Administration: Whole meds with puree Supervision: Full supervision/cueing for compensatory strategies Compensations: Slow rate;Small sips/bites;Minimize environmental distractions;Monitor for anterior loss;Lingual sweep for clearance of pocketing Postural Changes and/or Swallow Maneuvers: Seated upright 90 degrees Oral Care Recommendations: Oral care BID Patient destination: Home Follow up Recommendations: Home Health SLP;24 hour supervision/assistance Equipment Recommended: None recommended by SLP    SLP Frequency 3 to 5 out of 7 days   SLP Duration  SLP Intensity  SLP Treatment/Interventions 28 days   Minumum of 1-2 x/day, 30 to 90 minutes  Cognitive remediation/compensation;Dysphagia/aspiration precaution training;Cueing hierarchy;Environmental controls;Internal/external aids;Multimodal communication approach;Patient/family education;Speech/Language facilitation    Pain Pain Assessment Pain Scale: 0-10 Pain Score: 0-No pain  Prior  Functioning Cognitive/Linguistic Baseline: Baseline deficits Baseline deficit details: hx of CVA, lived in an ILF/ALF Type of Home: Apartment  Lives With: Alone Available Help at Discharge: Family;Available 24 hours/day Vocation: Retired  Industrial/product designer Term Goals: Week 1: SLP Short Term Goal 1 (Week 1): Pt will consume therapeutic trials of thin liquids with minimal overt s/s of aspiration and min cues for use of swallowing precautions.  SLP Short Term Goal 2 (Week 1): Pt will consume dys 2 textures and nectar thick liquids with minimal overt s/s of aspiration and supervision cues for use of swallowing precautions.   SLP Short Term Goal 3 (Week 1): Pt will verbalize at the word level in >50% of opportunities with mod assist multimodal cues.   SLP Short Term Goal 4 (Week 1): Pt will follow 1 step commands in >75% of opportunities with mod assist multimodal cues.   SLP Short Term Goal 5 (Week 1): Pt will answer basic, immediately relevant yes/no questions for >50% accuracy with min assist multimodal cues.   SLP Short Term Goal 6 (Week 1): Pt will match object to word from a field of three for 75% accuracy with mod assist multimodal cues.    Refer to Care Plan for Long Term Goals  Recommendations for other services: None   Discharge Criteria: Patient will be discharged from SLP if patient refuses treatment 3 consecutive times without medical reason, if treatment goals not met, if there is a change in medical status, if patient makes no progress towards goals or if patient is discharged from hospital.  The above assessment, treatment plan, treatment alternatives and goals were discussed and mutually agreed upon: by family  Emilio Math 04/05/2018, 12:37 PM

## 2018-04-05 NOTE — Progress Notes (Signed)
Social Work  Social Work Assessment and Plan  Patient Details  Name: Douglas [name] MRN: [id number] Date of Birth: 14-Oct-1950  Today's Date: 04/05/2018  Problem List:  Patient Active Problem List   Diagnosis Date Noted  . Cerebral edema (HCC) 04/04/2018  . Renal insufficiency 04/04/2018  . 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 Acute lower UTI   . Type 2 diabetes mellitus with peripheral neuropathy (HCC)   . Acute embolic stroke (HCC)   . Diabetes 1.5, managed as type 2 (HCC)   . Essential hypertension   . Dyslipidemia   . History of CVA with residual deficit   . Dysphagia, post-stroke    Past Medical History:  Past Medical History:  Diagnosis Date  . Diabetes mellitus without complication (HCC)   . Hypertension   . Stroke [facility](HCC)    Past Surgical History:  Past Surgical History:  Procedure Laterality Date  . IR PATIENT EVAL TECH 0-60 MINS  03/30/2018  . TEE WITHOUT CARDIOVERSION N/A 04/02/2018   Procedure: TRANSESOPHAGEAL ECHOCARDIOGRAM (TEE);  Surgeon: Chilton Siandolph, Tiffany, MD;  Location: [facility] ENDOSCOPY;  Service: Cardiovascular;  Laterality: N/A;   Social History:  reports that he has never smoked. He has never used smokeless tobacco. He reports that he drank alcohol. He reports that he does not use drugs.  Family / Support Systems Marital Status: Divorced Patient Roles: Parent Children: Douglas [name]-daughter [phone] Other Supports: Four other children-[place] daughter in [place] Anticipated Caregiver: [name] Ability/Limitations of Caregiver: [name] can work remotely from home, aware pt will need 24 hr care at discharge Caregiver Availability: 24/7 Family Dynamics: Pt has a close knit fmaily currently [name] her sister and their Mom-pt's ex-wife in an apartment together. Pt was in a duplex and was taking care of himself. 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 Legal Issues: No issues Guardian/Conservator: None-according to MD pt is capable of making his own decisions while here, daughter to be here for a little bit for transition to CIR then [facility] [facility]   Abuse/Neglect Abuse/Neglect Assessment Can Be Completed: Yes Physical Abuse: Denies Verbal Abuse: Denies Sexual Abuse: Denies Exploitation of patient/patient's resources: Denies Self-Neglect: Denies  Emotional Status Pt's affect, behavior adn adjustment status: Pt does try to communicate and nods and makes eye contact when worker was in obtaining information. Daughter answers the questions and reports he was doing well prior to this stroke. He was able to manage in the ALF prior to this stroke. Recent Psychosocial Issues: CVA in 2013 had deficits from this but managed Pyschiatric History: No history deferred depression screen due to adjusting to rehab and not communicating well from his stroke. He probably would benefit from seeing neuro-psych while here when more appropriate Substance Abuse History: No issues  Patient / Family Perceptions, Expectations & Goals Pt/Family understanding of illness & functional limitations: Pt and daughter have a good understanding of his stroke and deficits. [name] talks with the MD's daily and feels she has a good understanding of his plan going forward. Both are hopeful he will do well here and make a lot of progress. Premorbid pt/family roles/activities: Father, friend, grandfather, etc Anticipated changes in roles/activities/participation: resume Pt/family expectations/goals: Pt nodded and made eye contact, daughter explained his morning is going better now. [name] states: "  I will do what I need to do for him, I know he will need care at discharge from here."  [employer]: Other (Comment)(resident of ALF) Premorbid Home Care/DME Agencies: Other (Comment)(used a RW prior to admission) Transportation available at discharge: Family Resource referrals recommended: Neuropsychology, Support group (specify)  Discharge Planning Living Arrangements: Alone Support Systems: Children, Other relatives, Friends/neighbors, Church/faith community Type of Residence: Private residence Insurance Resources: [employer] Financial Resources: [employer] Financial Screen Referred: No Living Expenses: Rent Money Management: Family Does the patient have any problems obtaining your medications?: No Home Management: family Patient/Family Preliminary Plans: Plan now is for pt and daughter-[name] to move in together. [name] is looking for an handicapped accessible apartment. Her sister is moving to [place] with her military husband and her Mom is unsure what she is doing now. [name] plans to transition him here and then go back to [facility] and then come back here. Social Work Anticipated Follow Up Needs: HH/OP, Support Group  Clinical Impression Pleasant gentleman who is motivated to make progress and do well here. His daughter-[name] is here to assist with the evaluations by providing baseline information and function. She plans to be here or may return to [facility] once settled here on rehab. Her plan is to find an handicapped accessible apartment and is looking into caregivers, but can also work remotely. Will await therapy evaluations and work on best plan for him  Lucy Chris 04/05/2018, 11:30 AM

## 2018-04-05 NOTE — Plan of Care (Signed)
  Problem: Consults Goal: [facility] Description See Patient Education module for education specifics  04/05/2018 1524 by Kalman Shanotten, Varonica Siharath A, LPN Outcome: Progressing 04/05/2018 1522 by Doran Durandotten, Verner Mccrone A, LPN Outcome: Progressing   Problem: RH BOWEL ELIMINATION Goal: RH STG MANAGE BOWEL WITH ASSISTANCE Description STG Manage Bowel with min Assistance.  04/05/2018 1524 by Kalman Shanotten, Damyon Mullane A, LPN Outcome: Progressing 04/05/2018 1522 by Kalman Shanotten, Nessie Nong A, LPN Outcome: Progressing Goal: RH STG MANAGE BOWEL W/MEDICATION W/ASSISTANCE Description STG Manage Bowel with Medication with mod I Assistance.  04/05/2018 1524 by Kalman Shanotten, Gillian Kluever A, LPN Outcome: Progressing 04/05/2018 1522 by Kalman Shanotten, Esli Clements A, LPN Outcome: Progressing   Problem: RH BLADDER ELIMINATION Goal: RH STG MANAGE BLADDER WITH ASSISTANCE Description STG Manage Bladder With  Min Assistance  04/05/2018 1524 by Kalman Shanotten, Dmiya Malphrus A, LPN Outcome: Progressing 04/05/2018 1522 by Doran Durandotten, Atavia Poppe A, LPN Outcome: Progressing   Problem: RH SAFETY Goal: RH STG ADHERE TO SAFETY PRECAUTIONS W/ASSISTANCE/DEVICE Description STG Adhere to Safety Precautions With cues/reminders supervision/Assistance/Device.  04/05/2018 1524 by Kalman Shanotten, Satia Winger A, LPN Outcome: Progressing 04/05/2018 1522 by Doran Durandotten, De Libman A, LPN Outcome: Progressing   Problem: RH KNOWLEDGE DEFICIT Goal: RH STG INCREASE KNOWLEDGE OF DIABETES Description Daughter to be able to explain management of dm using medications na d dietary restrictions independently using handouts and resources. Dtr will be able to administer insulin independently  04/05/2018 1524 by Doran Durandotten, Hollan Philipp A, LPN Outcome: Progressing 04/05/2018 1522 by Doran Durandotten, Kalyiah Saintil A, LPN Outcome: Progressing Goal: RH STG INCREASE KNOWLEDGE OF HYPERTENSION Description Daughter to be able to explain management of HTN using medications na d dietary restrictions independently using handouts and  resources  04/05/2018 1524 by Doran Durandotten, Nikole Swartzentruber A, LPN Outcome: Progressing 04/05/2018 1522 by Kalman Shanotten, Neil Brickell A, LPN Outcome: Progressing Goal: RH STG INCREASE KNOWLEDGE OF DYSPHAGIA/FLUID INTAKE Description Dtr and pt will be able to demonstrate understanding of dysphagia diet restrictions and medication administration to avoid aspiration independently using handouts/resources  04/05/2018 1524 by Doran Durandotten, Mae Cianci A, LPN Outcome: Progressing 04/05/2018 1522 by Doran Durandotten, Johnnae Impastato A, LPN Outcome: Progressing Goal: RH STG INCREASE KNOWLEGDE OF HYPERLIPIDEMIA Description Daughter to be able to explain management of HLD using medications and dietary restrictions independently using handouts and resources  04/05/2018 1524 by Doran Durandotten, Martha Soltys A, LPN Outcome: Progressing 04/05/2018 1522 by Doran Durandotten, Jinan Biggins A, LPN Outcome: Progressing Goal: RH STG INCREASE KNOWLEDGE OF STROKE PROPHYLAXIS Description Daughter to be able to explain management of secondary stroke prevention using medications and dietary restrictions independently using handouts and resources  04/05/2018 1524 by Kalman Shanotten, Dionna Wiedemann A, LPN Outcome: Progressing 04/05/2018 1522 by Kalman Shanotten, Johnice Riebe A, LPN Outcome: Progressing

## 2018-04-06 ENCOUNTER — Inpatient Hospital Stay (HOSPITAL_COMMUNITY): Payer: Commercial Indemnity | Admitting: Physical Therapy

## 2018-04-06 ENCOUNTER — Inpatient Hospital Stay (HOSPITAL_COMMUNITY): Payer: Commercial Indemnity | Admitting: Speech Pathology

## 2018-04-06 LAB — GLUCOSE, CAPILLARY
Glucose-Capillary: 189 mg/dL — ABNORMAL HIGH (ref 70–99)
Glucose-Capillary: 190 mg/dL — ABNORMAL HIGH (ref 70–99)
Glucose-Capillary: 236 mg/dL — ABNORMAL HIGH (ref 70–99)
Glucose-Capillary: 204 mg/dL — ABNORMAL HIGH (ref 70–99)

## 2018-04-06 MED ORDER — INSULIN DETEMIR 100 UNIT/ML ~~LOC~~ SOLN
17.0000 [IU] | Freq: Two times a day (BID) | SUBCUTANEOUS | Status: DC
  Administered 2018-04-06 – 2018-04-09 (×6): 17 [IU] via SUBCUTANEOUS
  Filled 2018-04-06 (×7): qty 0.17

## 2018-04-06 NOTE — Plan of Care (Signed)
  Problem: Consults Goal: RH STROKE PATIENT EDUCATION Description See Patient Education module for education specifics  Outcome: Progressing   Problem: RH BOWEL ELIMINATION Goal: RH STG MANAGE BOWEL WITH ASSISTANCE Description STG Manage Bowel with min Assistance.  Outcome: Progressing Goal: RH STG MANAGE BOWEL W/MEDICATION W/ASSISTANCE Description STG Manage Bowel with Medication with mod I Assistance.  Outcome: Progressing   Problem: RH BLADDER ELIMINATION Goal: RH STG MANAGE BLADDER WITH ASSISTANCE Description STG Manage Bladder With  Min Assistance  Outcome: Progressing   Problem: RH SAFETY Goal: RH STG ADHERE TO SAFETY PRECAUTIONS W/ASSISTANCE/DEVICE Description STG Adhere to Safety Precautions With cues/reminders supervision/Assistance/Device.  Outcome: Progressing   Problem: RH KNOWLEDGE DEFICIT Goal: RH STG INCREASE KNOWLEDGE OF DIABETES Description Daughter to be able to explain management of dm using medications na d dietary restrictions independently using handouts and resources. Dtr will be able to administer insulin independently  Outcome: Progressing Goal: RH STG INCREASE KNOWLEDGE OF HYPERTENSION Description Daughter to be able to explain management of HTN using medications na d dietary restrictions independently using handouts and resources  Outcome: Progressing Goal: RH STG INCREASE KNOWLEDGE OF DYSPHAGIA/FLUID INTAKE Description Dtr and pt will be able to demonstrate understanding of dysphagia diet restrictions and medication administration to avoid aspiration independently using handouts/resources  Outcome: Progressing Goal: RH STG INCREASE KNOWLEGDE OF HYPERLIPIDEMIA Description Daughter to be able to explain management of HLD using medications and dietary restrictions independently using handouts and resources  Outcome: Progressing Goal: RH STG INCREASE KNOWLEDGE OF STROKE PROPHYLAXIS Description Daughter to be able to explain management of  secondary stroke prevention using medications and dietary restrictions independently using handouts and resources  Outcome: Progressing   

## 2018-04-06 NOTE — Progress Notes (Signed)
Occupational Therapy Session Note  Patient Details  Name: Douglas [name] MRN: [id number] Date of Birth: Sep 04, 1950  Today's Date: 04/06/2018 OT Individual Time: 1045-1200 OT Individual Time Calculation (min): 75 min    Short Term Goals: Week 1:  OT Short Term Goal 1 (Week 1): Pt will complete basic transfer with +1 assist in order to decrease caregiver burden OT Short Term Goal 2 (Week 1): Pt will attend to R UE during functional task with max multi-modal cuing OT Short Term Goal 3 (Week 1): Pt will locate 1 item R of midline during functional task with mod cuing  Skilled Therapeutic Interventions/Progress Updates:    OT intervention with focus on BADL retraining, bed mobility, sitting balance, sit<>stand, functional transfers, following one step commands, task initiation, sequencing, and activity tolerance to increase independence with BADLs. Pt requires tot A+2 for bed mobility, sit<>stand, and functional transfers.  Pt requires max multimodal cues to initiate tasks.  Pt non verbal.  Pt requires max multimodal cues for sequencing during bathing tasks but initiates dressing tasks when presented with clothing items.  Pt requires tot A +2 with max multimodal cues for sit<>stand and functional transfers.  Pt remained seated in TIS with belt alarm activated.   Therapy Documentation Precautions:  Precautions Precautions: Fall Precaution Comments: dense R hemi Restrictions Weight Bearing Restrictions: Yes Pain:  Pt denies pain   Therapy/Group: Individual Therapy  Rich BraveLanier, Hilary Milks Chappell 04/06/2018, 12:11 PM

## 2018-04-06 NOTE — Progress Notes (Addendum)
Late note;  I created the IPOC, initiated the Plan of care and initiated the patient education for this patient on 04/04/18 @ 1550. Pamelia HoitSharp, Kaston Faughn B

## 2018-04-06 NOTE — IPOC Note (Signed)
Overall Plan of Care [facility](IPOC) Patient Details Name: Douglas [name] MRN: [id number] DOB: 1950/05/12  Admitting Diagnosis: <principal problem not specified>  Hospital Problems: Active Problems:   Left middle cerebral artery stroke [facility](HCC)     Functional Problem List: Nursing Bladder, Bowel, Safety, Endurance, Medication Management  PT Balance, Behavior, Endurance, Motor, Perception, Safety, Sensory  OT Balance, Perception, Safety, Cognition, Sensory, Endurance, Motor, Vision  SLP Cognition, Linguistic, Nutrition  TR         Basic ADL's: OT Eating, Grooming, Bathing, Dressing, Toileting     Advanced  ADL's: OT       Transfers: PT Bed Mobility, Bed to Chair, Furniture, Set designerCar, Floor  OT Toilet     Locomotion: PT Stairs, Psychologist, prison and probation servicesWheelchair Mobility, Ambulation     Additional Impairments: OT Fuctional Use of Upper Extremity  SLP Swallowing, Communication, Social Cognition comprehension, expression Problem Solving, Attention, Awareness  TR      Anticipated Outcomes Item Anticipated Outcome  Self Feeding Set-up/supervision  Swallowing  supervision    Basic self-care  Min-mod A  Toileting  Mod A   Bathroom Transfers Min A to toilet/BSC  Bowel/Bladder  manage bowel with mod I assist and bladder with min assist  Transfers  min assist  Locomotion  min assist short distance gait, supervision w/c mobility   Communication  min assist   Cognition  min assist   Pain     Safety/Judgment  maintain safety with cues/supervision   Therapy Plan: PT Intensity: Minimum of 1-2 x/day ,45 to 90 minutes PT Frequency: 5 out of 7 days PT Duration Estimated Length of Stay: 4 weeks OT Intensity: Minimum of 1-2 x/day, 45 to 90 minutes OT Frequency: 5 out of 7 days OT Duration/Estimated Length of Stay: 4 weeks SLP Intensity: Minumum of 1-2 x/day, 30 to 90 minutes SLP Frequency: 3 to 5 out of 7 days SLP Duration/Estimated Length of Stay: 28 days     Team Interventions: Nursing  Interventions Patient/Family Education, Disease Management/Prevention, Discharge Planning, Bladder Management, Bowel Management, Medication Management, Dysphagia/Aspiration Precaution Training  PT interventions Disease management/prevention, Ambulation/gait training, Pain management, Stair training, Visual/perceptual remediation/compensation, Warden/rangerBalance/vestibular training, DME/adaptive equipment instruction, Patient/family education, Therapeutic Activities, Wheelchair propulsion/positioning, Therapeutic Exercise, Psychosocial support, Functional electrical stimulation, Cognitive remediation/compensation, Community reintegration, Functional mobility training, Skin care/wound management, UE/LE Strength taining/ROM, UE/LE Coordination activities, Splinting/orthotics, Neuromuscular re-education, Discharge planning  OT Interventions Balance/vestibular training, Discharge planning, Functional electrical stimulation, Pain management, Self Care/advanced ADL retraining, Therapeutic Activities, UE/LE Coordination activities, Therapeutic Exercise, Visual/perceptual remediation/compensation, Patient/family education, Functional mobility training, Disease mangement/prevention, Cognitive remediation/compensation, DME/adaptive equipment instruction, Neuromuscular re-education, Psychosocial support, Splinting/orthotics, UE/LE Strength taining/ROM, Wheelchair propulsion/positioning, Skin care/wound managment  SLP Interventions Cognitive remediation/compensation, Dysphagia/aspiration precaution training, Cueing hierarchy, Environmental controls, Internal/external aids, Multimodal communication approach, Patient/family education, Speech/Language facilitation  TR Interventions    SW/CM Interventions Discharge Planning, Psychosocial Support, Patient/Family Education   Barriers to Discharge MD  Medical stability and Nutritional means  Nursing      PT Incontinence, Medical stability, Decreased caregiver support    OT  Incontinence    SLP      SW       Team Discharge Planning: Destination: PT-Home(home w/ daughter) ,OT- Home , SLP-Home Projected Follow-up: PT-Home health PT, OT-  Home health OT, SLP-Home Health SLP, 24 hour supervision/assistance Projected Equipment Needs: PT-To be determined, OT- To be determined, SLP-None recommended by SLP Equipment Details: PT-has rollator , OT-  Patient/family involved in discharge planning: PT- Patient, Family member/caregiver,  OT-Patient, Family member/caregiver, SLP-Patient, Family  member/caregiver  MD ELOS: 21-24d Medical Rehab Prognosis:  Fair Assessment:  67 year old right-handed male with history of hypertension, diabetes mellitus, hyperlipidemia and 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 Per chart review and daughter, patient lives alone independent with assistive device prior to admission. Patient lives in [place] and was visiting family in the area. Presented 03/30/2018 with increasing speech difficulty and limited mobility. Cranial CT scan showed remote left PCA territory nonhemorrhagic infarction. CT angiogram of head and neck showed large left MCA territory infarction with extremely poor collaterals. Emergent large vessel occlusion proximal left M1 segment.Patient was initially maintained on 3% saline for a short time. Echocardiogram with ejection fraction of 65% without emboli. Follow-up MRI reviewed showing large MCA infarction. Dysphagia #2 nectar thick liquid diet.TEE completed 04/02/2018 ejection fraction 65% thrombus noted in the left atrial appendage no PFO. Patient currently maintained on aspirin and Plavix for CVA prophylaxis. Subcutaneous Lovenox for DVT prophylaxis. Patient spiked a low-grade fever 04/03/2018 with chest x-ray negative and urinalysis positive nitrite.Cultures and sensitivities pending   Now requiring 24/7 Rehab RN,MD, as well as CIR level PT, OT and SLP.  Treatment team will focus on ADLs and  mobility with goals set at Min/modA See Team Conference Notes for weekly updates to the plan of care

## 2018-04-06 NOTE — Progress Notes (Signed)
[place] PHYSICAL MEDICINE & REHABILITATION PROGRESS NOTE   Subjective/Complaints: Lethargy improved per nsg after fluid blous and IV abx  ROS cannot obtain due to aphasia  Objective:   No results found. Recent Labs    04/04/18 1545 04/05/18 0627  WBC 6.7 6.9  HGB 12.1* 12.5*  HCT 37.8* 37.5*  PLT 176 176   Recent Labs    04/04/18 0436 04/04/18 1545 04/05/18 0627  NA 137  --  134*  K 3.6  --  3.6  CL 99  --  97*  CO2 27  --  25  GLUCOSE 221*  --  181*  BUN 11  --  15  CREATININE 1.19 1.28* 1.26*  CALCIUM 8.9  --  9.0    Intake/Output Summary (Last 24 hours) at 04/06/2018 0908 Last data filed at 04/05/2018 1736 Gross per 24 hour  Intake 609.1 ml  Output -  Net 609.1 ml     Physical Exam: Vital Signs Blood pressure 131/76, pulse 64, temperature (!) 97.5 F (36.4 C), temperature source Oral, resp. rate 15, height 5\' 10"  (1.778 m), weight 88.9 kg, SpO2 100 %.    General: No acute distress Mood and affect lethargy Heart: Regular rate and rhythm no rubs murmurs or extra sounds Lungs: Clear to auscultation, breathing unlabored, no rales or wheezes Abdomen: Positive bowel sounds, soft nontender to palpation, nondistended Extremities: No clubbing, cyanosis, or edema Skin: No evidence of breakdown, no evidence of rash Neurologic: lethargic no grip on right moderate grip on left , some antigravity on Left flaccid RLESensory exam normal sensation to light touch and proprioception in bilateral upper and lower extremities Cerebellar exam cannot assess lethargy Musculoskeletal: no pain with shoulder or LE ROM  Assessment/Plan: 1. Functional deficits secondary to Left MCA infarct R HP and Aphasia which require 3+ hours per day of interdisciplinary therapy in a comprehensive inpatient rehab setting.  Physiatrist is providing close team supervision and 24 hour management of active medical problems listed below.  Physiatrist and rehab team continue to assess barriers  to discharge/monitor patient progress toward functional and medical goals  Care Tool:  Bathing    Body parts bathed by patient: Chest, Abdomen, Front perineal area, Face   Body parts bathed by helper: Right arm, Left arm, Buttocks, Right upper leg, Left upper leg, Right lower leg, Left lower leg     Bathing assist Assist Level: 2 Helpers     Upper Body Dressing/Undressing Upper body dressing   What is the patient wearing?: Pull over shirt    Upper body assist Assist Level: Moderate Assistance - Patient 50 - 74%    Lower Body Dressing/Undressing Lower body dressing      What is the patient wearing?: Pants, Underwear/pull up     Lower body assist Assist for lower body dressing: 2 Helpers     Toileting Toileting    Toileting assist Assist for toileting: 2 Helpers     Transfers Chair/bed transfer  Transfers assist     Chair/bed transfer assist level: 2 Helpers(stedy)     Locomotion Ambulation   Ambulation assist   Ambulation activity did not occur: Safety/medical concerns          Walk 10 feet activity   Assist  Walk 10 feet activity did not occur: Safety/medical concerns        Walk 50 feet activity   Assist Walk 50 feet with 2 turns activity did not occur: Safety/medical concerns         Walk  150 feet activity   Assist Walk 150 feet activity did not occur: Safety/medical concerns         Walk 10 feet on uneven surface  activity   Assist Walk 10 feet on uneven surfaces activity did not occur: Safety/medical concerns         Wheelchair     Assist Will patient use wheelchair at discharge?: Yes Type of Wheelchair: Manual Wheelchair activity did not occur: Safety/medical concerns         Wheelchair 50 feet with 2 turns activity    Assist    Wheelchair 50 feet with 2 turns activity did not occur: Safety/medical concerns       Wheelchair 150 feet activity     Assist Wheelchair 150 feet activity did not  occur: Safety/medical concerns         Medical Problem List and Plan: 1.  Right side weakness with aphasia/dysphagia secondary to large left MCA infarction as well as history of CVA 2013.aspirin and Plavix 3 months CIR eval PT, OT, SLP 2.  DVT Prophylaxis/Anticoagulation: subcutaneous Lovenox. Monitor for any bleeding episodes 3. Pain Management:  Tylenol as needed 4. Mood: BuSpar 7.5 mg twice a day, Provide emotional support 5. Neuropsych: This patient is capable of making decisions on his own behalf. 6. Skin/Wound Care:  Routine skin checks 7. Fluids/Electrolytes/Nutrition:  Routine in and out's with follow-up chemistries poor intake 360ml, increased creat , start IVF  8.Hypertension. Norvasc 10 mg daily, clonidine 0.2 mg daily, Toprol-XL 50 mg daily Vitals:   04/05/18 2137 04/06/18 0602  BP: (!) 160/82 131/76  Pulse: 81 64  Resp: 18 15  Temp: 99 F (37.2 C) (!) 97.5 F (36.4 C)  SpO2: 100% 100%  Controlled 12/6  9. Dysphagia. Dysphagia #2 nectar thick liquids. Follow-up speech therapy 10. Diabetes mellitus with peripheral neuropathy. Hemoglobin A1c 7.3. Glucotrol 5 mg twice a day,Levemir 15 units twice a day. Check blood sugars before meals and at bedtime. Diabetic teaching CBG (last 3)  Recent Labs    04/05/18 1635 04/05/18 2133 04/06/18 0605  GLUCAP 265* 186* 189*  increase levimir to 17U Twice a day 11. Hyperlipidemia. Lipitor 12.Suspect UTI. Urine study positive nitrites cultures and sensitivities pending.Begin empiric coverage- change to Rocephin due to poor intake,CBC ok  LOS: 2 days A FACE TO FACE EVALUATION WAS PERFORMED  Erick Colacendrew E Kirsteins 04/06/2018, 9:08 AM

## 2018-04-06 NOTE — Progress Notes (Signed)
Speech Language Pathology Daily Session Note  Patient Details  Name: Douglas [name] MRN: [id number] Date of Birth: 1950-11-19  Today's Date: 04/06/2018 SLP Individual Time: 1330-1415 SLP Individual Time Calculation (min): 45 min  Short Term Goals: Week 1: SLP Short Term Goal 1 (Week 1): Pt will consume therapeutic trials of thin liquids with minimal overt s/s of aspiration and min cues for use of swallowing precautions.  SLP Short Term Goal 2 (Week 1): Pt will consume dys 2 textures and nectar thick liquids with minimal overt s/s of aspiration and supervision cues for use of swallowing precautions.   SLP Short Term Goal 3 (Week 1): Pt will verbalize at the word level in >50% of opportunities with mod assist multimodal cues.   SLP Short Term Goal 4 (Week 1): Pt will follow 1 step commands in >75% of opportunities with mod assist multimodal cues.   SLP Short Term Goal 5 (Week 1): Pt will answer basic, immediately relevant yes/no questions for >50% accuracy with min assist multimodal cues.   SLP Short Term Goal 6 (Week 1): Pt will match object to word from a field of three for 75% accuracy with mod assist multimodal cues.    Skilled Therapeutic Interventions:  Skilled treatment session focused on dysphagia and communication goals. SLP facilitated session by providing skilled observation of pt consuming ice chips. Pt able to self-feed. Pt with intermittent coughing and right anterior spillage. Despite being repositioned pt continued to hold head forward. With max A cues, pt able to verbalize intermittently but speech was unintelligible d/t possible grabbled and decreased vocal intensity (speech < 25% at the simple word level). [name] present with further information on residual deficits following CVA in 2013 including deficits in higher level word finding, inability to read and inability to write (except his name). Plan to perform MBS on 04/09/18 to assess possibility of diet advancement. Pt  left upright in wheelchair, lap belt alarm on and all needs within reach. Continue per current plan of care.      Pain Pain Assessment Pain Scale: Faces Faces Pain Scale: No hurt  Therapy/Group: Individual Therapy  Serenity Batley 04/06/2018, 2:08 PM

## 2018-04-06 NOTE — Progress Notes (Signed)
Physical Therapy Session Note  Patient Details  Name: Douglas [name] MRN: [id number] Date of Birth: 1950/07/01  Today's Date: 04/06/2018 PT Individual Time: 1975-8832 PT Individual Time Calculation (min): 75 min   Short Term Goals: Week 1:  PT Short Term Goal 1 (Week 1): Pt will tolerate 30 min of upright activity w/o increase in fatigue PT Short Term Goal 2 (Week 1): Pt will transfer max assist x1 using LRAD PT Short Term Goal 3 (Week 1): Pt will initiate gait training PT Short Term Goal 4 (Week 1): Pt will follow simple, 1-step commands 50% of the time PT Short Term Goal 5 (Week 1): Pt will perform bed mobility w/ max assist x1  Skilled Therapeutic Interventions/Progress Updates:   Pt in w/c and agreeable to therapy, no c/o or evidence of pain throughout session. Pt much more alert and engaging this session. Eyes staying open throughout session, pt spontaneously tracking to R visual field w/ auditory stimulation, and attending to simple tasks w/ mod cues. Session focused on postural control, overcoming R pushing tendencies and facilitation of anterior and L lateral weight shifting. Performed multiple sit<>stands w/ stedy x2 helpers w/ manual and verbal facilitation of anterior weight shifting and maintaining midline on way up to standing, max assist x2 to maintain static standing prior to setting up stedy. Stayed in supported standing on stedy for 10+ minutes at a time, to work on BLE WB, while working on midline control and LUE reaching tasks to anterior and L lateral directions. Max multimodal cues at first to initiate and attend to task, fading to verbal cues only w/ repetition. Mod assist fading to supervision to facilitate L weight shifting and to maintain midline in static stance. During seated rest breaks, performed same L reaching tasks and performed static RLE stretching of hamstring, quad, and gastroc musculature. Moderate tone noticed this session. Returned to room via w/c and  ended session tilted in TIS, in care of daughter and all needs met. Adjusted room set-up to promote attention to R environment.   Therapy Documentation Precautions:  Precautions Precautions: Fall Precaution Comments: dense R hemi Restrictions Weight Bearing Restrictions: No Vital Signs: Therapy Vitals Temp: (!) 97.2 F (36.2 C) Pulse Rate: 72 Resp: 18 BP: 135/75 Patient Position (if appropriate): Sitting Oxygen Therapy SpO2: 100 % O2 Device: Room Air Pain: Pain Assessment Pain Scale: Faces Faces Pain Scale: No hurt  Therapy/Group: Individual Therapy  [name] Clent Demark 04/06/2018, 4:38 PM

## 2018-04-07 ENCOUNTER — Inpatient Hospital Stay (HOSPITAL_COMMUNITY): Payer: Commercial Indemnity | Admitting: Speech Pathology

## 2018-04-07 ENCOUNTER — Inpatient Hospital Stay (HOSPITAL_COMMUNITY): Payer: Commercial Indemnity | Admitting: Physical Therapy

## 2018-04-07 ENCOUNTER — Inpatient Hospital Stay (HOSPITAL_COMMUNITY): Payer: Commercial Indemnity | Admitting: Occupational Therapy

## 2018-04-07 LAB — GLUCOSE, CAPILLARY
GLUCOSE-CAPILLARY: 146 mg/dL — AB (ref 70–99)
Glucose-Capillary: 157 mg/dL — ABNORMAL HIGH (ref 70–99)
Glucose-Capillary: 141 mg/dL — ABNORMAL HIGH (ref 70–99)
Glucose-Capillary: 206 mg/dL — ABNORMAL HIGH (ref 70–99)

## 2018-04-07 MED ORDER — TIZANIDINE HCL 2 MG PO TABS: 2.0000 mg | ORAL_TABLET | Freq: Three times a day (TID) | ORAL | Status: DC

## 2018-04-07 MED ORDER — SODIUM CHLORIDE 0.45 % IV SOLN
INTRAVENOUS | Status: DC
  Administered 2018-04-07 – 2018-04-17 (×12): via INTRAVENOUS

## 2018-04-07 NOTE — Progress Notes (Signed)
Speech Language Pathology Daily Session Note  Patient Details  Name: Ashok CordiaWilliam L Lotz Jr MRN: [id number] Date of Birth: [birth year]  Today's Date: 04/07/2018 SLP Individual Time: [id number] SLP Individual Time Calculation (min): 60 min  Short Term Goals: Week 1: SLP Short Term Goal 1 (Week 1): Pt will consume therapeutic trials of thin liquids with minimal overt s/s of aspiration and min cues for use of swallowing precautions.  SLP Short Term Goal 2 (Week 1): Pt will consume dys 2 textures and nectar thick liquids with minimal overt s/s of aspiration and supervision cues for use of swallowing precautions.   SLP Short Term Goal 3 (Week 1): Pt will verbalize at the word level in >50% of opportunities with mod assist multimodal cues.   SLP Short Term Goal 4 (Week 1): Pt will follow 1 step commands in >75% of opportunities with mod assist multimodal cues.   SLP Short Term Goal 5 (Week 1): Pt will answer basic, immediately relevant yes/no questions for >50% accuracy with min assist multimodal cues.   SLP Short Term Goal 6 (Week 1): Pt will match object to word from a field of three for 75% accuracy with mod assist multimodal cues.    Skilled Therapeutic Interventions:  Skilled treatment session focused on dysphagia and cognition goals. SLP received pt upright in bed with bed repositioned in room to promote increased attention to pt's right. Pt self-feeding breakfast of dysphagia 2 with nectar thick liquids. Pt required Min A cues for rate and Mod A cues for bolus size. At end of consumption, pt with increased overall decreased alertness and increased widespread residue d/t decreased attention to mastication of bolus. Pt able to clear with Total A liquid wash and cues for alertness and sustained attention to task. Pt largely unable to follow specific compensatory strategies or 1 step directions. Pt with increased verbalization when tray taken but speech was grabbled. Pt was left upright in bed, bed alarm  on and all needs within reach with oral cavity clear of residue. Continue per current plan of care.      Pain Pain Assessment Pain Scale: Faces Pain Score: 0-No pain Faces Pain Scale: (no indication)  Therapy/Group: Individual Therapy  Allisa Einspahr 04/07/2018, 10:11 AM

## 2018-04-07 NOTE — Progress Notes (Signed)
Physical Therapy Session Note  Patient Details  Name: Douglas [name] MRN: [id number] Date of Birth: Jan 08, 1951  Today's Date: 04/07/2018 PT Individual Time: 1415-1520 PT Individual Time Calculation (min): 65 min   Short Term Goals: Week 1:  PT Short Term Goal 1 (Week 1): Pt will tolerate 30 min of upright activity w/o increase in fatigue PT Short Term Goal 2 (Week 1): Pt will transfer max assist x1 using LRAD PT Short Term Goal 3 (Week 1): Pt will initiate gait training PT Short Term Goal 4 (Week 1): Pt will follow simple, 1-step commands 50% of the time PT Short Term Goal 5 (Week 1): Pt will perform bed mobility w/ max assist x1  Skilled Therapeutic Interventions/Progress Updates:   Pt in w/c and agreeable to therapy, no c/o or evidence of pain throughout session. Pt awake and alert throughout session, following simple, 1-step commands w/ increased time 25% of the time. Performed sit<>stands in standing frame while working on midline orientation, upright posture, and standing tolerance/weight-bearing. Stood for 3-5 min at a time (x5 reps) w/ visual cues from mirror for midline orientation and manual and tactile cues for upright posture. Able to self-correct to midline orientation and neutral upright posture w/ verbal cues and max assist to facilitate LUE flexion onto forearm weight-bearing. Played music pt likes, per daughter, and worked on lateral weight shifting on forearms on standing frame, mod-max assist to facilitate. Intermittent manual assist to maintain RLE in neutral 2/2 flexor tone. Pt's shorts noted to be soiled, returned to room and transferred back to EOB w/ total assist x2 using slide board. Verbal and manual cues for technique, weight-shifting, and to functionally utilize LUE. Max assist rolling R/L for LE garment management and pericare. Pt expressed desire to stay in bed. Ended session sleeping in supine, all needs in reach and in care of daughter.   Therapy  Documentation Precautions:  Precautions Precautions: Fall Precaution Comments: dense R hemi Restrictions Weight Bearing Restrictions: No Vital Signs: Therapy Vitals Temp: 98.2 F (36.8 C) Pulse Rate: 66 Resp: 17 BP: (!) 141/77 Patient Position (if appropriate): Sitting Oxygen Therapy SpO2: 100 % O2 Device: Room Air  Therapy/Group: Individual Therapy  Douglas [name] 04/07/2018, 3:25 PM

## 2018-04-07 NOTE — Progress Notes (Signed)
Occupational Therapy Session Note  Patient Details  Name: Douglas [name] MRN: [id number] Date of Birth: Nov 07, 1950  Today's Date: 04/07/2018 OT Individual Time: [id number] OT Individual Time Calculation (min): 62 min   Short Term Goals: Week 1:  OT Short Term Goal 1 (Week 1): Pt will complete basic transfer with +1 assist in order to decrease caregiver burden OT Short Term Goal 2 (Week 1): Pt will attend to R UE during functional task with max multi-modal cuing OT Short Term Goal 3 (Week 1): Pt will locate 1 item R of midline during functional task with mod cuing  Skilled Therapeutic Interventions/Progress Updates:    Pt greeted in bed with dtr present. Appearing alert, though nonverbal throughout session. 2 helpers for supine<sit and sit<stand in [place] with manual facilitation for forward weight shifting. Worked on midline orientation, balance, and ADL retraining while pt bathed in perched position in [place]. Manual and tactile cues required to decrease Rt lean, also used mirror for visual feedback. He required max cues and setup for positioning and supporting affected limb throughout. Step by step cuing required for all bathing and dressing tasks due to perseverative tendencies, decreased initiation, attention, and apraxia. 2 assist for sit<stand in [place] for perihygiene post incontinent B+B. The same assist required for elevating pants over hips sit<stand from TIS with manual facilitation for decreasing Rt pushing. He was able to initiate threading L LE into pants while seated. Total A for Teds and footwear. Max multimodal cues for carryover of hemi techniques, with pt only initiating dressing of Lt side. Sensory input and vcs provided throughout to increase attention to affected side. At end of session pt was reclined in TIS with safety belt fastened and dtr present.   Therapy Documentation Precautions:  Precautions Precautions: Fall Precaution Comments: dense R hemi Restrictions Weight  Bearing Restrictions: No Pain: No s/s pain during tx  Pain Assessment Pain Scale: Faces Faces Pain Scale: (no indication) ADL:     Therapy/Group: Individual Therapy  Ardith Lewman A Phyllicia Dudek 04/07/2018, 12:46 PM

## 2018-04-07 NOTE — Progress Notes (Signed)
[facility] PHYSICAL MEDICINE & REHABILITATION PROGRESS NOTE   Subjective/Complaints: Lethargy improved but tone increasing RUE and RLE.  Flexor withdrawl noted per PT  ROS cannot obtain due to aphasia  Objective:   No results found. Recent Labs    04/04/18 1545 04/05/18 0627  WBC 6.7 6.9  HGB 12.1* 12.5*  HCT 37.8* 37.5*  PLT 176 176   Recent Labs    04/04/18 1545 04/05/18 0627  NA  --  134*  K  --  3.6  CL  --  97*  CO2  --  25  GLUCOSE  --  181*  BUN  --  15  CREATININE 1.28* 1.26*  CALCIUM  --  9.0    Intake/Output Summary (Last 24 hours) at 04/07/2018 1024 Last data filed at 04/06/2018 1746 Gross per 24 hour  Intake 120 ml  Output -  Net 120 ml     Physical Exam: Vital Signs Blood pressure 138/80, pulse 65, temperature 98.1 F (36.7 C), resp. rate 17, height 5\' 10"  (1.778 m), weight 88.9 kg, SpO2 99 %.    General: No acute distress Mood and affect lethargy Heart: Regular rate and rhythm no rubs murmurs or extra sounds Lungs: Clear to auscultation, breathing unlabored, no rales or wheezes Abdomen: Positive bowel sounds, soft nontender to palpation, nondistended Extremities: No clubbing, cyanosis, or edema Skin: No evidence of breakdown, no evidence of rash Neurologic: lethargic no grip on right moderate grip on left , some antigravity on Left flaccid RLESensory exam normal sensation to light touch and proprioception in bilateral upper and lower extremities Cerebellar exam cannot assess lethargy Musculoskeletal: no pain with shoulder or LE ROM  Assessment/Plan: 1. Functional deficits secondary to Left MCA infarct R HP and Aphasia which require 3+ hours per day of interdisciplinary therapy in a comprehensive inpatient rehab setting.  Physiatrist is providing close team supervision and 24 hour management of active medical problems listed below.  Physiatrist and rehab team continue to assess barriers to discharge/monitor patient progress toward  functional and medical goals  Care Tool:  Bathing    Body parts bathed by patient: Face, Chest, Abdomen, Left upper leg, Right upper leg, Front perineal area   Body parts bathed by helper: Right arm, Left arm, Chest, Abdomen, Front perineal area, Buttocks, Right upper leg, Left upper leg, Right lower leg, Left lower leg, Face     Bathing assist Assist Level: 2 Helpers     Upper Body Dressing/Undressing Upper body dressing   What is the patient wearing?: Pull over shirt    Upper body assist Assist Level: Total Assistance - Patient < 25%    Lower Body Dressing/Undressing Lower body dressing      What is the patient wearing?: (Shorts)     Lower body assist Assist for lower body dressing: Total Assistance - Patient < 25%     Toileting Toileting    Toileting assist Assist for toileting: 2 Helpers     Transfers Chair/bed transfer  Transfers assist     Chair/bed transfer assist level: 2 Helpers     Locomotion Ambulation   Ambulation assist   Ambulation activity did not occur: Safety/medical concerns          Walk 10 feet activity   Assist  Walk 10 feet activity did not occur: Safety/medical concerns        Walk 50 feet activity   Assist Walk 50 feet with 2 turns activity did not occur: Safety/medical concerns  Walk 150 feet activity   Assist Walk 150 feet activity did not occur: Safety/medical concerns         Walk 10 feet on uneven surface  activity   Assist Walk 10 feet on uneven surfaces activity did not occur: Safety/medical concerns         Wheelchair     Assist Will patient use wheelchair at discharge?: Yes Type of Wheelchair: Manual Wheelchair activity did not occur: Safety/medical concerns         Wheelchair 50 feet with 2 turns activity    Assist    Wheelchair 50 feet with 2 turns activity did not occur: Safety/medical concerns       Wheelchair 150 feet activity     Assist Wheelchair  150 feet activity did not occur: Safety/medical concerns         Medical Problem List and Plan: 1.  Right side weakness with aphasia/dysphagia secondary to large left MCA infarction as well as history of CVA 2013.aspirin and Plavix 3 months CIR eval PT, OT, SLP 2.  DVT Prophylaxis/Anticoagulation: subcutaneous Lovenox. Monitor for any bleeding episodes 3. Pain Management:  Tylenol as needed 4. Mood: BuSpar 7.5 mg twice a day, Provide emotional support 5. Neuropsych: This patient is capable of making decisions on his own behalf. 6. Skin/Wound Care:  Routine skin checks 7. Fluids/Electrolytes/Nutrition:  Routine in and out's with follow-up chemistries poor intake ~493ml, increased creat , start IVF at noc   8.Hypertension. Norvasc 10 mg daily, clonidine 0.2 mg daily, Toprol-XL 50 mg daily Vitals:   04/06/18 1934 04/07/18 0549  BP: (!) 161/87 138/80  Pulse: 74 65  Resp: 15 17  Temp: 98.8 F (37.1 C) 98.1 F (36.7 C)  SpO2: 98% 99%  Controlled 12/7  9. Dysphagia. Dysphagia #2 nectar thick liquids. Follow-up speech therapy 10. Diabetes mellitus with peripheral neuropathy. Hemoglobin A1c 7.3. Glucotrol 5 mg twice a day,Levemir 15 units twice a day. Check blood sugars before meals and at bedtime. Diabetic teaching CBG (last 3)  Recent Labs    04/06/18 1650 04/06/18 2134 04/07/18 0552  GLUCAP 236* 204* 157*  increase levimir to 17U Twice a day, CBG better this am 11. Hyperlipidemia. Lipitor 12.Suspect UTI. Urine study positive nitrites + bact .Begin empiric coverage- change to Rocephin due to poor intake,CBC ok  LOS: 3 days A FACE TO FACE EVALUATION WAS PERFORMED  Erick Colace 04/07/2018, 10:24 AM

## 2018-04-08 LAB — BASIC METABOLIC PANEL
Anion gap: 10 (ref 5–15)
BUN: 12 mg/dL (ref 8–23)
CO2: 29 mmol/L (ref 22–32)
Calcium: 9.2 mg/dL (ref 8.9–10.3)
Chloride: 97 mmol/L — ABNORMAL LOW (ref 98–111)
Creatinine, Ser: 1.16 mg/dL (ref 0.61–1.24)
GFR calc Af Amer: >60 mL/min (ref >60)
GFR calc non Af Amer: >60 mL/min (ref >60)
Glucose, Bld: 135 mg/dL — ABNORMAL HIGH (ref 70–99)
POTASSIUM: 3.7 mmol/L (ref 3.5–5.1)
Sodium: 136 mmol/L (ref 135–145)

## 2018-04-08 LAB — CBC WITH DIFFERENTIAL/PLATELET
Abs Immature Granulocytes: 0.04 10*3/uL (ref 0.00–0.07)
Basophils Absolute: 0 10*3/uL (ref 0.0–0.1)
Basophils Relative: 1 %
Eosinophils Absolute: 0.2 10*3/uL (ref 0.0–0.5)
Eosinophils Relative: 3 %
HCT: 38.3 % — ABNORMAL LOW (ref 39.0–52.0)
Hemoglobin: 12.8 g/dL — ABNORMAL LOW (ref 13.0–17.0)
Immature Granulocytes: 1 %
Lymphocytes Relative: 21 %
Lymphs Abs: 1.3 10*3/uL (ref 0.7–4.0)
MCH: 29.1 pg (ref 26.0–34.0)
MCHC: 33.4 g/dL (ref 30.0–36.0)
MCV: 87 fL (ref 80.0–100.0)
Monocytes Absolute: 0.6 10*3/uL (ref 0.1–1.0)
Monocytes Relative: 10 %
NEUTROS ABS: 4.1 10*3/uL (ref 1.7–7.7)
Neutrophils Relative %: 64 %
Platelets: 237 10*3/uL (ref 150–400)
RBC: 4.4 MIL/uL (ref 4.22–5.81)
RDW: 12 % (ref 11.5–15.5)
WBC: 6.4 10*3/uL (ref 4.0–10.5)
nRBC: 0 % (ref 0.0–0.2)

## 2018-04-08 LAB — GLUCOSE, CAPILLARY
GLUCOSE-CAPILLARY: 198 mg/dL — AB (ref 70–99)
Glucose-Capillary: 117 mg/dL — ABNORMAL HIGH (ref 70–99)
Glucose-Capillary: 187 mg/dL — ABNORMAL HIGH (ref 70–99)
Glucose-Capillary: 243 mg/dL — ABNORMAL HIGH (ref 70–99)

## 2018-04-08 MED ORDER — SODIUM CHLORIDE 0.9 % IV SOLN
INTRAVENOUS | Status: DC | PRN
  Administered 2018-04-08 – 2018-04-12 (×2): 250 mL via INTRAVENOUS

## 2018-04-08 NOTE — Progress Notes (Signed)
Incontinent with timed toileting. Voids incontinently on floor and in bed. Non-verbal. +/- sleep R/T incontinence. Alfredo MartinezMurray, Jamiesha Victoria A

## 2018-04-08 NOTE — Progress Notes (Signed)
[facility] PHYSICAL MEDICINE & REHABILITATION PROGRESS NOTE   Subjective/Complaints:  No issues overnite  ROS cannot obtain due to aphasia  Objective:   No results found. Recent Labs    04/08/18 0618  WBC 6.4  HGB 12.8*  HCT 38.3*  PLT 237   Recent Labs    04/08/18 0618  NA 136  K 3.7  CL 97*  CO2 29  GLUCOSE 135*  BUN 12  CREATININE 1.16  CALCIUM 9.2    Intake/Output Summary (Last 24 hours) at 04/08/2018 0938 Last data filed at 04/08/2018 0919 Gross per 24 hour  Intake 2093.59 ml  Output 525 ml  Net 1568.59 ml     Physical Exam: Vital Signs Blood pressure (!) 145/82, pulse 61, temperature 98.2 F (36.8 C), resp. rate 16, height 5\' 10"  (1.778 m), weight 88.9 kg, SpO2 100 %.    General: No acute distress Mood and affect lethargy Heart: Regular rate and rhythm no rubs murmurs or extra sounds Lungs: Clear to auscultation, breathing unlabored, no rales or wheezes Abdomen: Positive bowel sounds, soft nontender to palpation, nondistended Extremities: No clubbing, cyanosis, or edema Skin: No evidence of breakdown, no evidence of rash Neurologic: lethargic no grip on right moderate grip on left , some antigravity on Left flaccid RLESensory exam normal sensation to light touch and proprioception in bilateral upper and lower extremities Cerebellar exam cannot assess lethargy Musculoskeletal: no pain with shoulder or LE ROM  Assessment/Plan: 1. Functional deficits secondary to Left MCA infarct R HP and Aphasia which require 3+ hours per day of interdisciplinary therapy in a comprehensive inpatient rehab setting.  Physiatrist is providing close team supervision and 24 hour management of active medical problems listed below.  Physiatrist and rehab team continue to assess barriers to discharge/monitor patient progress toward functional and medical goals  Care Tool:  Bathing    Body parts bathed by patient: Face, Chest, Abdomen, Left upper leg, Front perineal  area   Body parts bathed by helper: Right arm, Left arm, Chest, Abdomen, Front perineal area, Buttocks, Right upper leg, Left upper leg, Right lower leg, Left lower leg, Face     Bathing assist Assist Level: 2 Helpers     Upper Body Dressing/Undressing Upper body dressing   What is the patient wearing?: Pull over shirt    Upper body assist Assist Level: Maximal Assistance - Patient 25 - 49%    Lower Body Dressing/Undressing Lower body dressing      What is the patient wearing?: Pants     Lower body assist Assist for lower body dressing: Maximal Assistance - Patient 25 - 49%     Toileting Toileting    Toileting assist Assist for toileting: 2 Helpers     Transfers Chair/bed transfer  Transfers assist     Chair/bed transfer assist level: 2 Helpers     Locomotion Ambulation   Ambulation assist   Ambulation activity did not occur: Safety/medical concerns          Walk 10 feet activity   Assist  Walk 10 feet activity did not occur: Safety/medical concerns        Walk 50 feet activity   Assist Walk 50 feet with 2 turns activity did not occur: Safety/medical concerns         Walk 150 feet activity   Assist Walk 150 feet activity did not occur: Safety/medical concerns         Walk 10 feet on uneven surface  activity   Assist Walk 10  feet on uneven surfaces activity did not occur: Safety/medical concerns         Wheelchair     Assist Will patient use wheelchair at discharge?: Yes Type of Wheelchair: Manual Wheelchair activity did not occur: Safety/medical concerns         Wheelchair 50 feet with 2 turns activity    Assist    Wheelchair 50 feet with 2 turns activity did not occur: Safety/medical concerns       Wheelchair 150 feet activity     Assist Wheelchair 150 feet activity did not occur: Safety/medical concerns         Medical Problem List and Plan: 1.  Right side weakness with aphasia/dysphagia  secondary to large left MCA infarction as well as history of CVA 2013.aspirin and Plavix 3 months CIR eval PT, OT, SLP 2.  DVT Prophylaxis/Anticoagulation: subcutaneous Lovenox. Monitor for any bleeding episodes 3. Pain Management:  Tylenol as needed 4. Mood: BuSpar 7.5 mg twice a day, Provide emotional support 5. Neuropsych: This patient is capable of making decisions on his own behalf. 6. Skin/Wound Care:  Routine skin checks 7. Fluids/Electrolytes/Nutrition:  Routine in and out's with follow-up chemistries poor intake ~474ml, increased creat , start IVF at noc , BMET normal 8.Hypertension. Norvasc 10 mg daily, clonidine 0.2 mg daily, Toprol-XL 50 mg daily Vitals:   04/07/18 2030 04/08/18 0350  BP: (!) 145/84 (!) 145/82  Pulse: 73 61  Resp: 18 16  Temp: 98.6 F (37 C) 98.2 F (36.8 C)  SpO2: 100% 100%  Controlled 12/8 9. Dysphagia. Dysphagia #2 nectar thick liquids. Follow-up speech therapy 10. Diabetes mellitus with peripheral neuropathy. Hemoglobin A1c 7.3. Glucotrol 5 mg twice a day,Levemir 15 units twice a day. Check blood sugars before meals and at bedtime. Diabetic teaching CBG (last 3)  Recent Labs    04/07/18 1213 04/07/18 1629 04/07/18 2105  GLUCAP 141* 206* 146*  increase levimir to 17U Twice a day, CBG pnd, serum glucose 135 this am, pre prandial, cont current dose 11. Hyperlipidemia. Lipitor 12.Suspect UTI. Urine study positive nitrites + bact .Begin empiric coverage- change to Rocephin due to poor intake,CBC ok  LOS: 4 days A FACE TO FACE EVALUATION WAS PERFORMED  Erick Colace 04/08/2018, 9:38 AM

## 2018-04-09 ENCOUNTER — Inpatient Hospital Stay (HOSPITAL_COMMUNITY): Payer: Commercial Indemnity | Admitting: Occupational Therapy

## 2018-04-09 ENCOUNTER — Inpatient Hospital Stay (HOSPITAL_COMMUNITY): Payer: Medicare Other

## 2018-04-09 ENCOUNTER — Encounter (HOSPITAL_COMMUNITY): Payer: Commercial Indemnity | Admitting: Speech Pathology

## 2018-04-09 ENCOUNTER — Inpatient Hospital Stay (HOSPITAL_COMMUNITY): Payer: Commercial Indemnity

## 2018-04-09 LAB — GLUCOSE, CAPILLARY
GLUCOSE-CAPILLARY: 205 mg/dL — AB (ref 70–99)
Glucose-Capillary: 198 mg/dL — ABNORMAL HIGH (ref 70–99)
Glucose-Capillary: 176 mg/dL — ABNORMAL HIGH (ref 70–99)
Glucose-Capillary: 235 mg/dL — ABNORMAL HIGH (ref 70–99)

## 2018-04-09 MED ORDER — INSULIN DETEMIR 100 UNIT/ML ~~LOC~~ SOLN
19.0000 [IU] | Freq: Two times a day (BID) | SUBCUTANEOUS | Status: DC
  Administered 2018-04-09 – 2018-04-11 (×4): 19 [IU] via SUBCUTANEOUS
  Filled 2018-04-09 (×4): qty 0.19

## 2018-04-09 NOTE — Progress Notes (Signed)
Physical Therapy Session Note  Patient Details  Name: Douglas [name] MRN: [id number] Date of Birth: 1950-07-16  Today's Date: 04/09/2018 PT Individual Time: 1400-1515 PT Individual Time Calculation (min): 75 min   Short Term Goals: Week 1:  PT Short Term Goal 1 (Week 1): Pt will tolerate 30 min of upright activity w/o increase in fatigue PT Short Term Goal 2 (Week 1): Pt will transfer max assist x1 using LRAD PT Short Term Goal 3 (Week 1): Pt will initiate gait training PT Short Term Goal 4 (Week 1): Pt will follow simple, 1-step commands 50% of the time PT Short Term Goal 5 (Week 1): Pt will perform bed mobility w/ max assist x1  Skilled Therapeutic Interventions/Progress Updates:    Pt supine in bed upon PT arrival, agreeable to therapy tx and no evidence of pain, pt unable to report. Pt appears incontinent of bladder, rolling in each direction total assist and dependent for peri care/clothing management. Pt transferred supine>sitting EOB with total assist. Pt transferred to TIS w/c total assist +2 lateral scoot with slideboard. Pt transported to the dayroom. Pt used standing frame this session x 8 minutes while working on upright activity tolerance, midline in standing, and reaching with L UE to L side and overhead for bean bags, hand over hand assist for initiation to grab bean bags. Pt worked on sitting balance and anterior weightshifting to lean forward reaching for bean bags while sitting in TIS, tactile/hand over hand cueing. Pt transported to gym. Pt performed slideboard transfer from TIS to the R, therapist transferred pt while pt was reaching for horseshoes from the tech for anterior weightshift, total assist. Seated edge of mat pt able to maintain sitting balance with CGA-min assist while reaching forward and to the left for horseshoes, x 1 LOB posteriorly this session with max cueing/assist to correct. Transfer back to TIS total assist +2 with slideboard and transported back to  room. Pt left in TIS with chair alarm set and needs in reach, daughter present for entire session.   Therapy Documentation Precautions:  Precautions Precautions: Fall Precaution Comments: dense R hemi Restrictions Weight Bearing Restrictions: No    Therapy/Group: Individual Therapy  Cresenciano GenreEmily van Schagen, PT, DPT 04/09/2018, 9:29 AM

## 2018-04-09 NOTE — Progress Notes (Signed)
Occupational Therapy Session Note  Patient Details  Name: Douglas CordiaWilliam L Peel Jr MRN: [id number] Date of Birth: 1950-07-31  Today's Date: 04/09/2018 OT Individual Time: 1308-65780830-0930 OT Individual Time Calculation (min): 60 min    Short Term Goals: Week 1:  OT Short Term Goal 1 (Week 1): Pt will complete basic transfer with +1 assist in order to decrease caregiver burden OT Short Term Goal 2 (Week 1): Pt will attend to R UE during functional task with max multi-modal cuing OT Short Term Goal 3 (Week 1): Pt will locate 1 item R of midline during functional task with mod cuing  Skilled Therapeutic Interventions/Progress Updates:    Pt resting in bed upon arrival with RN present.  Pt incontinent of bowel/bladder with no awareness.  Pt required tot A+2 for hygiene and rolling bed.  Pt required max multimodal cues to initiate assisting with rolling in bed.  OT intervention with focus on bed mobility, sitting balance, functional transfers, task initiation with dressing tasks, sequencing, sit<>stand, attention to R, activity tolerance, and awareness to increase independence with BADLs. Pt required max A for supine>sit with HOB elevated but able to maintain sitting balance with CGA. Pt completed dressing tasks seated EOB.  Pt initiated threading LUE into shirt sleeve and pulling over head.  Pt required tot A for threading RUE into shirt sleeve and min A for pulling over trunk.  Pt initiated threading LLE into pants but required max A to complete tasks.  Pt required tot A for threading RLE into pants and tot A+2 for pulling pants over hips.  Pt required tot A+2 for scooting on bed to prepare for squat pivot transfer to w/c.  Pt returned to bed for transport for MBS.    Therapy Documentation Precautions:  Precautions Precautions: Fall Precaution Comments: dense R hemi Restrictions Weight Bearing Restrictions: No  Pain:  Pt with no s/s of pain   Therapy/Group: Individual Therapy  Rich BraveLanier, Serinity Ware  Chappell 04/09/2018, 9:39 AM

## 2018-04-09 NOTE — Progress Notes (Signed)
Modified Barium Swallow Progress Note  Patient Details  Name: Ashok CordiaWilliam L Arnell Jr MRN: [id number] Date of Birth: April 02, 1951  Today's Date: 04/09/2018  Modified Barium Swallow completed.  Full report located under Chart Review in the Imaging Section.  Brief recommendations include the following:  Clinical Impression  Pt presents with significant oral and pharyngeal dysphagia. Pt's oral phase is c/b decreased bolus cohesion, lingual pumping, premature spillage and right anterior spillage/pooling. Pt's pharyngeal phase is severely dleayed with thin liquids resting in the pyriform sinuses before swallow is initiated (after lots of lingual pumping). This results in aspiration during swallow that is largely unsensed. Right head turn attempted but pt was not able to comprehend strategy. Recommend pt continuing dysphagia 2 with nectar thick liquids.   Swallow Evaluation Recommendations       SLP Diet Recommendations: Dysphagia 2 (Fine chop) solids;Nectar thick liquid   Liquid Administration via: Cup   Medication Administration: Whole meds with liquid   Supervision: Staff to assist with self feeding;Full supervision/cueing for compensatory strategies   Compensations: Slow rate;Small sips/bites;Minimize environmental distractions;Monitor for anterior loss;Lingual sweep for clearance of pocketing   Postural Changes: Seated upright at 90 degrees   Oral Care Recommendations: Oral care before and after PO   Other Recommendations: Order thickener from pharmacy;Have oral suction available    Mitchel Delduca 04/09/2018,12:00 PM

## 2018-04-09 NOTE — Progress Notes (Signed)
Attempts to time toilet unsuccessful. Incontinent of urine, in bed or on floor. Few sips of water at HS. HS IVF's.Alfredo MartinezMurray, Subhan Hoopes A

## 2018-04-09 NOTE — Progress Notes (Signed)
Occupational Therapy Session Note  Patient Details  Name: Douglas [name] MRN: [id number] Date of Birth: 07-27-1950  Today's Date: 04/09/2018 OT Individual Time: 1448-1856 OT Individual Time Calculation (min): 28 min    Short Term Goals: Week 1:  OT Short Term Goal 1 (Week 1): Pt will complete basic transfer with +1 assist in order to decrease caregiver burden OT Short Term Goal 2 (Week 1): Pt will attend to R UE during functional task with max multi-modal cuing OT Short Term Goal 3 (Week 1): Pt will locate 1 item R of midline during functional task with mod cuing  Skilled Therapeutic Interventions/Progress Updates:    Pt received in bed and nodded yes to therapy. Pt did need a cue to scan eyes to R to see this therapist.   Pt has increased tone in RUE especially in shoulder and elbow flexors.  PROM and extended stretching for UE.  [name] present and educated her on how to do stretching especially at his elbow and hand.  During PROM, pt kept his eyes on his R arm the entire time with no cuing.  Had pt clasp his hands together for self ROM with total A to guide arms, but again good attention to R arm.  Suggested to his daughter to do a lot of gentle touch to his arm. Pt's R arm positioned with a pillow under axilla to prevent pecs from getting over tight.  Pt in bed with all needs met and daughter in the room.     Therapy Documentation Precautions:  Precautions Precautions: Fall Precaution Comments: dense R hemi Restrictions Weight Bearing Restrictions: No  Pain: Pain Assessment Pain Scale: Faces Faces Pain Scale: No hurt  Therapy/Group: Individual Therapy  [name] 04/09/2018, 12:42 PM

## 2018-04-09 NOTE — Progress Notes (Signed)
Speech Language Pathology Daily Session Note  Patient Details  Name: Douglas [name] MRN: [id number] Date of Birth: 1951-02-26  Today's Date: 04/09/2018 SLP Individual Time: 1010-1020 SLP Individual Time Calculation (min): 10 min  Short Term Goals: Week 1: SLP Short Term Goal 1 (Week 1): Pt will consume therapeutic trials of thin liquids with minimal overt s/s of aspiration and min cues for use of swallowing precautions.  SLP Short Term Goal 2 (Week 1): Pt will consume dys 2 textures and nectar thick liquids with minimal overt s/s of aspiration and supervision cues for use of swallowing precautions.   SLP Short Term Goal 3 (Week 1): Pt will verbalize at the word level in >50% of opportunities with mod assist multimodal cues.   SLP Short Term Goal 4 (Week 1): Pt will follow 1 step commands in >75% of opportunities with mod assist multimodal cues.   SLP Short Term Goal 5 (Week 1): Pt will answer basic, immediately relevant yes/no questions for >50% accuracy with min assist multimodal cues.   SLP Short Term Goal 6 (Week 1): Pt will match object to word from a field of three for 75% accuracy with mod assist multimodal cues.    Skilled Therapeutic Interventions:  Skilled treatment session focused on education with pt and daughter regarding MBS. Video of study played for daughter with explanation of deficits. Recommend pt continue on nectar thick liquids, explained that pt would benefit from cognitive-linguistic therapy to improve overall ability. All questions answered to daughter's satisfaction.      Pain Pain Assessment Pain Scale: Faces Faces Pain Scale: No hurt  Therapy/Group: Individual Therapy  Brandalyn Harting 04/09/2018, 12:05 PM

## 2018-04-09 NOTE — Progress Notes (Signed)
[facility] PHYSICAL MEDICINE & REHABILITATION PROGRESS NOTE   Subjective/Complaints:  Remains globally aphasic  ROS cannot obtain due to aphasia  Objective:   No results found. Recent Labs    04/08/18 0618  WBC 6.4  HGB 12.8*  HCT 38.3*  PLT 237   Recent Labs    04/08/18 0618  NA 136  K 3.7  CL 97*  CO2 29  GLUCOSE 135*  BUN 12  CREATININE 1.16  CALCIUM 9.2    Intake/Output Summary (Last 24 hours) at 04/09/2018 0827 Last data filed at 04/09/2018 0601 Gross per 24 hour  Intake 1155.07 ml  Output 550 ml  Net 605.07 ml     Physical Exam: Vital Signs Blood pressure (!) 155/84, pulse 66, temperature 99 F (37.2 C), temperature source Oral, resp. rate 18, height 5\' 10"  (1.778 m), weight 88.9 kg, SpO2 100 %.    General: No acute distress Mood and affect lethargy Heart: Regular rate and rhythm no rubs murmurs or extra sounds Lungs: Clear to auscultation, breathing unlabored, no rales or wheezes Abdomen: Positive bowel sounds, soft nontender to palpation, nondistended Extremities: No clubbing, cyanosis, or edema Skin: No evidence of breakdown, no evidence of rash Neurologic: lethargic no grip on right moderate grip on left , some antigravity on Left flaccid RLESensory exam normal sensation to light touch and proprioception in bilateral upper and lower extremities Cerebellar exam cannot assess lethargy Musculoskeletal: no pain with shoulder or LE ROM Unable to close eyes to command  Assessment/Plan: 1. Functional deficits secondary to Left MCA infarct R HP and Aphasia which require 3+ hours per day of interdisciplinary therapy in a comprehensive inpatient rehab setting.  Physiatrist is providing close team supervision and 24 hour management of active medical problems listed below.  Physiatrist and rehab team continue to assess barriers to discharge/monitor patient progress toward functional and medical goals  Care Tool:  Bathing    Body parts bathed by  patient: Face, Chest, Abdomen, Left upper leg, Front perineal area   Body parts bathed by helper: Right arm, Left arm, Chest, Abdomen, Front perineal area, Buttocks, Right upper leg, Left upper leg, Right lower leg, Left lower leg, Face     Bathing assist Assist Level: 2 Helpers     Upper Body Dressing/Undressing Upper body dressing   What is the patient wearing?: Pull over shirt    Upper body assist Assist Level: Maximal Assistance - Patient 25 - 49%    Lower Body Dressing/Undressing Lower body dressing      What is the patient wearing?: Pants     Lower body assist Assist for lower body dressing: Maximal Assistance - Patient 25 - 49%     Toileting Toileting    Toileting assist Assist for toileting: 2 Helpers     Transfers Chair/bed transfer  Transfers assist     Chair/bed transfer assist level: 2 Helpers     Locomotion Ambulation   Ambulation assist   Ambulation activity did not occur: Safety/medical concerns          Walk 10 feet activity   Assist  Walk 10 feet activity did not occur: Safety/medical concerns        Walk 50 feet activity   Assist Walk 50 feet with 2 turns activity did not occur: Safety/medical concerns         Walk 150 feet activity   Assist Walk 150 feet activity did not occur: Safety/medical concerns         Walk 10 feet on  uneven surface  activity   Assist Walk 10 feet on uneven surfaces activity did not occur: Safety/medical concerns         Wheelchair     Assist Will patient use wheelchair at discharge?: Yes Type of Wheelchair: Manual Wheelchair activity did not occur: Safety/medical concerns         Wheelchair 50 feet with 2 turns activity    Assist    Wheelchair 50 feet with 2 turns activity did not occur: Safety/medical concerns       Wheelchair 150 feet activity     Assist Wheelchair 150 feet activity did not occur: Safety/medical concerns         Medical Problem  List and Plan: 1.  Right side weakness with aphasia/dysphagia secondary to large left MCA infarction 03/30/18 as well as history of CVA 2013.aspirin and Plavix 3 months CIR eval PT, OT, SLP 2.  DVT Prophylaxis/Anticoagulation: subcutaneous Lovenox. Monitor for any bleeding episodes 3. Pain Management:  Tylenol as needed 4. Mood: BuSpar 7.5 mg twice a day, Provide emotional support 5. Neuropsych: This patient is capable of making decisions on his own behalf. 6. Skin/Wound Care:  Routine skin checks 7. Fluids/Electrolytes/Nutrition:  Routine in and out's with follow-up chemistries poor intake ~95400ml, increased creat , start IVF at noc , BMET normal 8.Hypertension. Norvasc 10 mg daily, clonidine 0.2 mg daily, Toprol-XL 50 mg daily Vitals:   04/08/18 1935 04/09/18 0459  BP: (!) 171/76 (!) 155/84  Pulse: 72 66  Resp: 18 18  Temp: 99.1 F (37.3 C) 99 F (37.2 C)  SpO2: 100% 100%  systolic elevated 12/9, may make further adjustment if this remains elevated 9. Dysphagia. Dysphagia #2 nectar thick liquids. Follow-up speech therapy 10. Diabetes mellitus with peripheral neuropathy. Hemoglobin A1c 7.3. Glucotrol 5 mg twice a day,Levemir 15 units twice a day. Check blood sugars before meals and at bedtime. Diabetic teaching CBG (last 3)  Recent Labs    04/08/18 1654 04/08/18 2111 04/09/18 0629  GLUCAP 243* 198* 198*  increase levimir to 17U Twice a day, CBG pnd, serum glucose increase to 19U BID11. Hyperlipidemia. Lipitor 12.Suspect UTI. Urine study positive nitrites + bact .Begin empiric coverage- change to Rocephin due to poor intake,CBC ok  LOS: 5 days A FACE TO FACE EVALUATION WAS PERFORMED  Erick Colacendrew E Sadira Standard 04/09/2018, 8:27 AM

## 2018-04-10 ENCOUNTER — Inpatient Hospital Stay (HOSPITAL_COMMUNITY): Payer: Self-pay

## 2018-04-10 ENCOUNTER — Inpatient Hospital Stay (HOSPITAL_COMMUNITY): Payer: Commercial Indemnity

## 2018-04-10 ENCOUNTER — Inpatient Hospital Stay (HOSPITAL_COMMUNITY): Payer: Commercial Indemnity | Admitting: Speech Pathology

## 2018-04-10 LAB — GLUCOSE, CAPILLARY
GLUCOSE-CAPILLARY: 178 mg/dL — AB (ref 70–99)
Glucose-Capillary: 155 mg/dL — ABNORMAL HIGH (ref 70–99)
Glucose-Capillary: 211 mg/dL — ABNORMAL HIGH (ref 70–99)
Glucose-Capillary: 174 mg/dL — ABNORMAL HIGH (ref 70–99)

## 2018-04-10 NOTE — Progress Notes (Signed)
Physical Therapy Session Note  Patient Details  Name: Douglas CordiaWilliam L Lantz Jr MRN: [id number] Date of Birth: 01-Jan-1951  Today's Date: 04/10/2018 PT Individual Time: 1100-1125 PT Individual Time Calculation (min): 25 min  Missed time: 35 minutes (fatigue)  Short Term Goals: Week 1:  PT Short Term Goal 1 (Week 1): Pt will tolerate 30 min of upright activity w/o increase in fatigue PT Short Term Goal 2 (Week 1): Pt will transfer max assist x1 using LRAD PT Short Term Goal 3 (Week 1): Pt will initiate gait training PT Short Term Goal 4 (Week 1): Pt will follow simple, 1-step commands 50% of the time PT Short Term Goal 5 (Week 1): Pt will perform bed mobility w/ max assist x1  Skilled Therapeutic Interventions/Progress Updates:    Pt seated in TIS w/c upon PT arrival. Pt asleep, opens eyes to verbal/tactile stimuli but unable to sustain attention and falls back asleep. Therapist transported pt to gym total assist in TIS w/c, therapist attempting to wake pt. Pt continues to briefly open eyes, does not make eyes contact or visually scan, falling back asleep quickly. Pt transported back to room. Pt performed slideboard transfer to bed with total assist +2, therapists attempting to stimulate pt and get pt to engage in task, pt unable secondary to fatigue. Pt seated EOB with mod assist for balance, therapist continuing to stimulate pt, however pt remains lethargic. Pt transferred to supine with +2 assist, left supine with needs in reach and bed alarm set. Pt missed 35 minutes this session secondary to fatigue.   Therapy Documentation Precautions:  Precautions Precautions: Fall Precaution Comments: dense R hemi Restrictions Weight Bearing Restrictions: No    Therapy/Group: Individual Therapy  Cresenciano GenreEmily van Schagen, PT, DPT 04/10/2018, 7:52 AM

## 2018-04-10 NOTE — Progress Notes (Signed)
Speech Language Pathology Daily Session Note  Patient Details  Name: Douglas [name] MRN: [id number] Date of Birth: 08-18-1950  Today's Date: 04/10/2018 SLP Individual Time: 1300-1355 SLP Individual Time Calculation (min): 55 min  Short Term Goals: Week 1: SLP Short Term Goal 1 (Week 1): Pt will consume therapeutic trials of thin liquids with minimal overt s/s of aspiration and min cues for use of swallowing precautions.  SLP Short Term Goal 2 (Week 1): Pt will consume dys 2 textures and nectar thick liquids with minimal overt s/s of aspiration and supervision cues for use of swallowing precautions.   SLP Short Term Goal 3 (Week 1): Pt will verbalize at the word level in >50% of opportunities with mod assist multimodal cues.   SLP Short Term Goal 4 (Week 1): Pt will follow 1 step commands in >75% of opportunities with mod assist multimodal cues.   SLP Short Term Goal 5 (Week 1): Pt will answer basic, immediately relevant yes/no questions for >50% accuracy with min assist multimodal cues.   SLP Short Term Goal 6 (Week 1): Pt will match object to word from a field of three for 75% accuracy with mod assist multimodal cues.    Skilled Therapeutic Interventions: Skilled treatment session focused on dysphagia and speech goals. SLP facilitated session by providing Min-Mod A verbal and visual cues for a slow rate of self-feeding and to clear right buccal pocketing with lunch meal of Dys. 2 textures with nectar-thick liquids. Patient consumed meal with overt cough X 2, suspect due to full oral cavity with decreased attention to bolus. Recommend patient continue current diet. SLP also facilitated session by providing Max A verbal and tactile cues for vocalization on command and to produce the phonemes /m/ and /ah/ and was able to produce the CV word "mah" X 1. Patient lethargic at end of session and required Max A verbal cues for sustained attention to task. Patient left upright in bed with alarm on and  family present. Continue with current plan of care.      Pain No/Denies Pain   Therapy/Group: Individual Therapy  Costas Sena 04/10/2018, 2:55 PM

## 2018-04-10 NOTE — Progress Notes (Signed)
Occupational Therapy Session Note  Patient Details  Name: Douglas [name] MRN: [id number] Date of Birth: 09/02/1950  Today's Date: 04/10/2018 OT Individual Time: [id number] OT Individual Time Calculation (min): 75 min    Short Term Goals: Week 1:  OT Short Term Goal 1 (Week 1): Pt will complete basic transfer with +1 assist in order to decrease caregiver burden OT Short Term Goal 2 (Week 1): Pt will attend to R UE during functional task with max multi-modal cuing OT Short Term Goal 3 (Week 1): Pt will locate 1 item R of midline during functional task with mod cuing  Skilled Therapeutic Interventions/Progress Updates:    OT intervention with focus on bed mobility, sitting balance, sit<>stand, functional transfers, BADL retraining (dressing), task initiation, attention to R, attention to task, and activity tolerance to increase independence with BADLs. Pt indicated he needed to use urinal.  Urinal provided and pt used appropriately. Pt required max A for supine>sit EOB to engaged in dressing tasks (pt on night bath).  Pt initiated donning shirt but threading LUE and attempting to pull over head.  Pt required assistance to complete task.  Pt attempted to initiate threading LLE into pants but unable to problem solve sequence.  Pt required tot A for LB dressing tasks including sit<>stand from EOB.  Pt required tot A +2 for squat pivot transfer to w/c.  Pt transitioned to gym and transferred to mat.  Pt initiated reaching for horseshoe with max multimodal cues to initiate.  Pt initiated catching small beach ball but unable to toss ball back at rehab tech.  Pt returned to w/c and remained in w/c with belt alarm activated and all needs within reach.  NT notified that pt returned to room.   Therapy Documentation Precautions:  Precautions Precautions: Fall Precaution Comments: dense R hemi Restrictions Weight Bearing Restrictions: No Pain:  no s/s of pain   Therapy/Group: Individual  Therapy  Rich BraveLanier, Diana Armijo Chappell 04/10/2018, 9:50 AM

## 2018-04-10 NOTE — Progress Notes (Signed)
[place] PHYSICAL MEDICINE & REHABILITATION PROGRESS NOTE   Subjective/Complaints:  Appreciate MBS, discussed pt with OT, lethargy has improved, but RUE and RLE tone is an issue  ROS cannot obtain due to aphasia  Objective:   Dg Swallowing Func-speech Pathology  Result Date: 04/09/2018 Objective Swallowing Evaluation: Type of Study: MBS-Modified Barium Swallow Study  Patient Details Name: Douglas [name] MRN: [id number] Date of Birth: [birth year] Today's Date: 04/09/2018 Time: SLP Start Time (ACUTE ONLY): 0736 -SLP Stop Time (ACUTE ONLY): 0755 SLP Time Calculation (min) (ACUTE ONLY): 19 min Past Medical History: Past Medical History: Diagnosis Date . Diabetes mellitus without complication (HCC)  . Hypertension  . Stroke [facility])  Past Surgical History: Past Surgical History: Procedure Laterality Date . IR PATIENT EVAL TECH 0-60 MINS  03/30/2018 . TEE WITHOUT CARDIOVERSION N/A 04/02/2018  Procedure: TRANSESOPHAGEAL ECHOCARDIOGRAM (TEE);  Surgeon: [name], MD;  Location: [facility] ENDOSCOPY;  Service: Cardiovascular;  Laterality: N/A; HPI: 67 y.o. male with a history of previous stroke, hypertension, diabetes, hyperlipidemia who presents with right-sided weakness and aphasia that started abruptly at 9 AM.  He was in his normal state of health and came downstairs and when his eating breakfast when he suddenly stopped responding. MRI brain 03/31/18 indicated Evolving large acute left MCA infarct with new cytotoxic edema and minimal petechial hemorrhage. No midline shift. 2. Large chronic left PCA infarct. 3. Severe chronic small vessel ischemic disease.  No data recorded Assessment / Plan / Recommendation CHL IP CLINICAL IMPRESSIONS 04/09/2018 Clinical Impression Pt presents with significant oral and pharyngeal dysphagia. Pt's oral phase is c/b decreased bolus cohesion, lingual pumping, premature spillage and right anterior spillage/pooling. Pt's pharyngeal phase is severely dleayed with thin liquids  resting in the pyriform sinuses before swallow is initiated (after lots of lingual pumping). This results in aspiration during swallow that is largely unsensed. Right head turn attempted but pt was not able to comprehend strategy. Recommend pt continuing dysphagia 2 with nectar thick liquids. SLP Visit Diagnosis Dysphagia, oropharyngeal phase (R13.12) Attention and concentration deficit following -- Frontal lobe and executive function deficit following -- Impact on safety and function Moderate aspiration risk   CHL IP TREATMENT RECOMMENDATION 04/09/2018 Treatment Recommendations Therapy as outlined in treatment plan below   Prognosis 04/01/2018 Prognosis for Safe Diet Advancement Good Barriers to Reach Goals -- Barriers/Prognosis Comment -- CHL IP DIET RECOMMENDATION 04/09/2018 SLP Diet Recommendations Dysphagia 2 (Fine chop) solids;Nectar thick liquid Liquid Administration via Cup Medication Administration Whole meds with liquid Compensations Slow rate;Small sips/bites;Minimize environmental distractions;Monitor for anterior loss;Lingual sweep for clearance of pocketing Postural Changes Seated upright at 90 degrees   CHL IP OTHER RECOMMENDATIONS 04/09/2018 Recommended Consults -- Oral Care Recommendations Oral care before and after PO Other Recommendations Order thickener from pharmacy;Have oral suction available   CHL IP FOLLOW UP RECOMMENDATIONS 04/09/2018 Follow up Recommendations Inpatient Rehab   CHL IP FREQUENCY AND DURATION 04/01/2018 Speech Therapy Frequency (ACUTE ONLY) min 2x/week Treatment Duration 2 weeks      CHL IP ORAL PHASE 04/09/2018 Oral Phase Impaired Oral - Pudding Teaspoon -- Oral - Pudding Cup -- Oral - Honey Teaspoon -- Oral - Honey Cup -- Oral - Nectar Teaspoon Right anterior bolus loss;Weak lingual manipulation Oral - Nectar Cup Right anterior bolus loss;Lingual pumping;Delayed oral transit Oral - Nectar Straw NT Oral - Thin Teaspoon Right anterior bolus loss;Lingual pumping;Weak lingual  manipulation;Incomplete tongue to palate contact;Decreased bolus cohesion;Premature spillage;Delayed oral transit Oral - Thin Cup Right anterior bolus loss;Weak lingual manipulation;Lingual pumping;Incomplete tongue  to palate contact;Delayed oral transit;Decreased bolus cohesion;Premature spillage Oral - Thin Straw NT Oral - Puree -- Oral - Mech Soft -- Oral - Regular -- Oral - Multi-Consistency -- Oral - Pill -- Oral Phase - Comment --  CHL IP PHARYNGEAL PHASE 04/09/2018 Pharyngeal Phase Impaired Pharyngeal- Pudding Teaspoon -- Pharyngeal -- Pharyngeal- Pudding Cup -- Pharyngeal -- Pharyngeal- Honey Teaspoon -- Pharyngeal -- Pharyngeal- Honey Cup -- Pharyngeal -- Pharyngeal- Nectar Teaspoon Delayed swallow initiation-pyriform sinuses Pharyngeal -- Pharyngeal- Nectar Cup Delayed swallow initiation-pyriform sinuses Pharyngeal -- Pharyngeal- Nectar Straw NT Pharyngeal -- Pharyngeal- Thin Teaspoon Delayed swallow initiation-pyriform sinuses;Penetration/Aspiration during swallow;Moderate aspiration;Reduced tongue base retraction Pharyngeal Material enters airway, passes BELOW cords without attempt by patient to eject out (silent aspiration) Pharyngeal- Thin Cup Delayed swallow initiation-pyriform sinuses;Reduced tongue base retraction;Penetration/Aspiration during swallow;Moderate aspiration Pharyngeal Material enters airway, passes BELOW cords without attempt by patient to eject out (silent aspiration) Pharyngeal- Thin Straw NT Pharyngeal -- Pharyngeal- Puree NT Pharyngeal -- Pharyngeal- Mechanical Soft -- Pharyngeal -- Pharyngeal- Regular -- Pharyngeal -- Pharyngeal- Multi-consistency -- Pharyngeal -- Pharyngeal- Pill -- Pharyngeal -- Pharyngeal Comment --  CHL IP CERVICAL ESOPHAGEAL PHASE 04/09/2018 Cervical Esophageal Phase WFL Pudding Teaspoon -- Pudding Cup -- Honey Teaspoon -- Honey Cup -- Nectar Teaspoon -- Nectar Cup -- Nectar Straw -- Thin Teaspoon -- Thin Cup -- Thin Straw -- Puree -- Mechanical Soft -- Regular  -- Multi-consistency -- Pill -- Cervical Esophageal Comment -- Douglas [name] 04/09/2018, 12:01 PM              Recent Labs    04/08/18 0618  WBC 6.4  HGB 12.8*  HCT 38.3*  PLT 237   Recent Labs    04/08/18 0618  NA 136  K 3.7  CL 97*  CO2 29  GLUCOSE 135*  BUN 12  CREATININE 1.16  CALCIUM 9.2    Intake/Output Summary (Last 24 hours) at 04/10/2018 0858 Last data filed at 04/10/2018 0852 Gross per 24 hour  Intake 1230 ml  Output 250 ml  Net 980 ml     Physical Exam: Vital Signs Blood pressure 126/77, pulse 66, temperature 98.5 F (36.9 C), temperature source Oral, resp. rate 16, height 5\' 10"  (1.778 m), weight 88.9 kg, SpO2 99 %.    General: No acute distress Mood and affect lethargy Heart: Regular rate and rhythm no rubs murmurs or extra sounds Lungs: Clear to auscultation, breathing unlabored, no rales or wheezes Abdomen: Positive bowel sounds, soft nontender to palpation, nondistended Extremities: No clubbing, cyanosis, or edema Skin: No evidence of breakdown, no evidence of rash Neurologic: lethargic no grip on right moderate grip on left , some antigravity on Left flaccid RLESensory exam normal sensation to light touch and proprioception in bilateral upper and lower extremities Cerebellar exam cannot assess lethargy Musculoskeletal: no pain with shoulder or LE ROM Unable to close eyes to command  Assessment/Plan: 1. Functional deficits secondary to Left MCA infarct R HP and Aphasia which require 3+ hours per day of interdisciplinary therapy in a comprehensive inpatient rehab setting.  Physiatrist is providing close team supervision and 24 hour management of active medical problems listed below.  Physiatrist and rehab team continue to assess barriers to discharge/monitor patient progress toward functional and medical goals  Care Tool:  Bathing  Bathing activity did not occur: Safety/medical concerns(night bath) Body parts bathed by patient: Face, Chest,  Abdomen, Left upper leg, Front perineal area   Body parts bathed by helper: Right arm, Left arm, Chest, Abdomen, Front perineal area, Left  upper leg, Buttocks, Right upper leg, Right lower leg, Left lower leg, Face(pt given cloth did not participate)     Bathing assist Assist Level: 2 Helpers     Upper Body Dressing/Undressing Upper body dressing   What is the patient wearing?: Pull over shirt    Upper body assist Assist Level: Maximal Assistance - Patient 25 - 49%    Lower Body Dressing/Undressing Lower body dressing      What is the patient wearing?: Pants     Lower body assist Assist for lower body dressing: 2 Helpers     Toileting Toileting    Toileting assist Assist for toileting: 2 Helpers     Transfers Chair/bed transfer  Transfers assist     Chair/bed transfer assist level: 2 Helpers     Locomotion Ambulation   Ambulation assist   Ambulation activity did not occur: Safety/medical concerns          Walk 10 feet activity   Assist  Walk 10 feet activity did not occur: Safety/medical concerns        Walk 50 feet activity   Assist Walk 50 feet with 2 turns activity did not occur: Safety/medical concerns         Walk 150 feet activity   Assist Walk 150 feet activity did not occur: Safety/medical concerns         Walk 10 feet on uneven surface  activity   Assist Walk 10 feet on uneven surfaces activity did not occur: Safety/medical concerns         Wheelchair     Assist Will patient use wheelchair at discharge?: Yes Type of Wheelchair: Manual Wheelchair activity did not occur: Safety/medical concerns         Wheelchair 50 feet with 2 turns activity    Assist    Wheelchair 50 feet with 2 turns activity did not occur: Safety/medical concerns       Wheelchair 150 feet activity     Assist Wheelchair 150 feet activity did not occur: Safety/medical concerns         Medical Problem List and  Plan: 1.  Right side weakness with aphasia/dysphagia secondary to large left MCA infarction 03/30/18 as well as history of CVA 2013.aspirin and Plavix 3 months CIR  PT, OT, SLP team conf in am 2.  DVT Prophylaxis/Anticoagulation: subcutaneous Lovenox. Monitor for any bleeding episodes 3. Pain Management:  Tylenol as needed 4. Mood: BuSpar 7.5 mg twice a day, Provide emotional support 5. Neuropsych: This patient is capable of making decisions on his own behalf. 6. Skin/Wound Care:  Routine skin checks 7. Fluids/Electrolytes/Nutrition:  Routine in and out's with follow-up chemistries poor intake ~942ml, increased creat , start IVF at noc , BMET normal 8.Hypertension. Norvasc 10 mg daily, clonidine 0.2 mg daily, Toprol-XL 50 mg daily Vitals:   04/09/18 1953 04/10/18 0426  BP: (!) 141/78 126/77  Pulse: 74 66  Resp: 16 16  Temp: 98.1 F (36.7 C) 98.5 F (36.9 C)  SpO2: 100% 99%  systolic elevated 12/9, may make further adjustment if this remains elevated 9. Dysphagia. Dysphagia #2 nectar thick liquids. Follow-up speech therapy 10. Diabetes mellitus with peripheral neuropathy. Hemoglobin A1c 7.3. Glucotrol 5 mg twice a day,Levemir 15 units twice a day. Check blood sugars before meals and at bedtime. Diabetic teaching CBG (last 3)  Recent Labs    04/09/18 1641 04/09/18 2051 04/10/18 0613  GLUCAP 176* 235* 155*  improving control levimir  19U BID11. Hyperlipidemia. Lipitor 12.Suspect UTI. Urine  study positive nitrites + bact .Begin empiric coverage-  Rocephin day 6/7 13.  Spasticity RUE and RLE start zanaflex, monitor sedation LOS: 6 days A FACE TO FACE EVALUATION WAS PERFORMED  Erick Colace 04/10/2018, 8:58 AM

## 2018-04-11 ENCOUNTER — Inpatient Hospital Stay (HOSPITAL_COMMUNITY): Payer: Commercial Indemnity | Admitting: Speech Pathology

## 2018-04-11 ENCOUNTER — Inpatient Hospital Stay (HOSPITAL_COMMUNITY): Payer: Self-pay

## 2018-04-11 ENCOUNTER — Inpatient Hospital Stay (HOSPITAL_COMMUNITY): Payer: Commercial Indemnity

## 2018-04-11 ENCOUNTER — Inpatient Hospital Stay (HOSPITAL_COMMUNITY): Payer: Commercial Indemnity | Admitting: Occupational Therapy

## 2018-04-11 LAB — BASIC METABOLIC PANEL
Anion gap: 10 (ref 5–15)
BUN: 12 mg/dL (ref 8–23)
CO2: 26 mmol/L (ref 22–32)
Calcium: 9.3 mg/dL (ref 8.9–10.3)
Chloride: 101 mmol/L (ref 98–111)
Creatinine, Ser: 1.14 mg/dL (ref 0.61–1.24)
GFR calc Af Amer: >60 mL/min (ref >60)
GFR calc non Af Amer: >60 mL/min (ref >60)
Glucose, Bld: 154 mg/dL — ABNORMAL HIGH (ref 70–99)
Potassium: 4 mmol/L (ref 3.5–5.1)
Sodium: 137 mmol/L (ref 135–145)

## 2018-04-11 LAB — GLUCOSE, CAPILLARY
Glucose-Capillary: 143 mg/dL — ABNORMAL HIGH (ref 70–99)
Glucose-Capillary: 181 mg/dL — ABNORMAL HIGH (ref 70–99)

## 2018-04-11 MED ORDER — INSULIN DETEMIR 100 UNIT/ML ~~LOC~~ SOLN
21.0000 [IU] | Freq: Two times a day (BID) | SUBCUTANEOUS | Status: DC
  Administered 2018-04-11 – 2018-04-20 (×18): 21 [IU] via SUBCUTANEOUS
  Filled 2018-04-11 (×19): qty 0.21

## 2018-04-11 MED ORDER — METHYLPHENIDATE HCL 5 MG PO TABS
5.0000 mg | ORAL_TABLET | Freq: Two times a day (BID) | ORAL | Status: DC
  Administered 2018-04-11 – 2018-04-15 (×8): 5 mg via ORAL
  Filled 2018-04-11 (×8): qty 1

## 2018-04-11 NOTE — Progress Notes (Signed)
Speech Language Pathology Daily Session Note  Patient Details  Name: Douglas [name] MRN: [id number] Date of Birth: 06/20/1950  Today's Date: 04/11/2018 SLP Individual Time: 1300-1355 SLP Individual Time Calculation (min): 55 min  Short Term Goals: Week 1: SLP Short Term Goal 1 (Week 1): Pt will consume therapeutic trials of thin liquids with minimal overt s/s of aspiration and min cues for use of swallowing precautions.  SLP Short Term Goal 2 (Week 1): Pt will consume dys 2 textures and nectar thick liquids with minimal overt s/s of aspiration and supervision cues for use of swallowing precautions.   SLP Short Term Goal 3 (Week 1): Pt will verbalize at the word level in >50% of opportunities with mod assist multimodal cues.   SLP Short Term Goal 4 (Week 1): Pt will follow 1 step commands in >75% of opportunities with mod assist multimodal cues.   SLP Short Term Goal 5 (Week 1): Pt will answer basic, immediately relevant yes/no questions for >50% accuracy with min assist multimodal cues.   SLP Short Term Goal 6 (Week 1): Pt will match object to word from a field of three for 75% accuracy with mod assist multimodal cues.    Skilled Therapeutic Interventions: Skilled treatment session focused on communication goals. Upon arrival, patient was awake while supine in bed. Patient transferred to til-in-space wheelchair with maximize arousal and participation. SLP facilitated session by providing Max A multimodal cues for patient to vocalize at the phoneme and word level. Patient able to achieve in 25% of opportunities. Although patient demonstrated difficulty with coordinating phonation, patient was able to count from 1 to 5 X 3 with Mod A verbal and visual cues and verbalized "ready," "me" and "may" with Max verbal, tactile and visual cues. Patient required frequent rest breaks due to maximize attention to tasks.  Patient handed off to PT. Continue with current plan of care.      Pain No/Denies  Pain   Therapy/Group: Individual Therapy  [name] 04/11/2018, 3:18 PM

## 2018-04-11 NOTE — Progress Notes (Signed)
Social Work Patient ID: Douglas [name], male   DOB: 1950/05/27, 67 y.o.   MRN: [id number] Met with pt and daughter to discuss team conference goals mod assist level and target discharge date 1/1. Daughter reports he is trying hard and doing his best. Team can see he is working hard and doing his best, they do not deny this. She is having Mom in [facility] look for handicapped accessible apartment, while she is telling her which ones to go too. She is hoping Dad will get better than the team thinks. She is aware of his deficits from this stroke and how it compounds the deficits from his other stroke. Will work on discharge plans.

## 2018-04-11 NOTE — Progress Notes (Signed)
Occupational Therapy Session Note  Patient Details  Name: Douglas [name] MRN: [id number] Date of Birth: 06/19/1950  Today's Date: 04/11/2018 OT Individual Time: 0900-1000 OT Individual Time Calculation (min): 60 min    Short Term Goals: Week 1:  OT Short Term Goal 1 (Week 1): Pt will complete basic transfer with +1 assist in order to decrease caregiver burden OT Short Term Goal 2 (Week 1): Pt will attend to R UE during functional task with max multi-modal cuing OT Short Term Goal 3 (Week 1): Pt will locate 1 item R of midline during functional task with mod cuing  Skilled Therapeutic Interventions/Progress Updates:    Pt supine in bed upon arrival with OTR finishing up session.  OTR assisted with pt sitting EOB and transferring to w/c to completed UB dressing tasks.  Pt initiates donning shirt with placement of LUE into sleeve and donning over head.  Pt required assistance to complete task.  Pt transitioned to Day Room to eat breakfast.  Pt required mod multimodal cues to locate food items on R side of breakfast tray.  Pt required mod verbal cues for portion control and rate of self feeding.  Pt required max verbal cues to wipe R side of mouth with spillage.  Pt returned to room and remained in w/c with all needs within reach and belt alarm activated.   Therapy Documentation Precautions:  Precautions Precautions: Fall Precaution Comments: dense R hemi Restrictions Weight Bearing Restrictions: No  Pain: Pain Assessment Pain Scale: Faces Faces Pain Scale: No hurt   Therapy/Group: Individual Therapy  Douglas [name], Douglas [name] 04/11/2018, 11:04 AM

## 2018-04-11 NOTE — Progress Notes (Signed)
[place] PHYSICAL MEDICINE & REHABILITATION PROGRESS NOTE   Subjective/Complaints:  Pt was reortedly more lethargic with therapy  ROS cannot obtain due to aphasia  Objective:   Dg Swallowing Func-speech Pathology  Result Date: 04/09/2018 Objective Swallowing Evaluation: Type of Study: MBS-Modified Barium Swallow Study  Patient Details Name: Douglas [name] MRN: [id number] Date of Birth: 03-26-1951 Today's Date: 04/09/2018 Time: SLP Start Time (ACUTE ONLY): 0736 -SLP Stop Time (ACUTE ONLY): 0755 SLP Time Calculation (min) (ACUTE ONLY): 19 min Past Medical History: Past Medical History: Diagnosis Date . Diabetes mellitus without complication ([employer])  . Hypertension  . Stroke [facility])  Past Surgical History: Past Surgical History: Procedure Laterality Date . IR PATIENT EVAL TECH 0-60 MINS  03/30/2018 . TEE WITHOUT CARDIOVERSION N/A 04/02/2018  Procedure: TRANSESOPHAGEAL ECHOCARDIOGRAM (TEE);  Surgeon: Skeet Latch, MD;  Location: [facility] ENDOSCOPY;  Service: Cardiovascular;  Laterality: N/A; HPI: 67 y.o. male with a history of previous stroke, hypertension, diabetes, hyperlipidemia who presents with right-sided weakness and aphasia that started abruptly at 9 AM.  He was in his normal state of health and came downstairs and when his eating breakfast when he suddenly stopped responding. MRI brain 03/31/18 indicated Evolving large acute left MCA infarct with new cytotoxic edema and minimal petechial hemorrhage. No midline shift. 2. Large chronic left PCA infarct. 3. Severe chronic small vessel ischemic disease.  No data recorded Assessment / Plan / Recommendation CHL IP CLINICAL IMPRESSIONS 04/09/2018 Clinical Impression Pt presents with significant oral and pharyngeal dysphagia. Pt's oral phase is c/b decreased bolus cohesion, lingual pumping, premature spillage and right anterior spillage/pooling. Pt's pharyngeal phase is severely dleayed with thin liquids resting in the pyriform sinuses before swallow is  initiated (after lots of lingual pumping). This results in aspiration during swallow that is largely unsensed. Right head turn attempted but pt was not able to comprehend strategy. Recommend pt continuing dysphagia 2 with nectar thick liquids. SLP Visit Diagnosis Dysphagia, oropharyngeal phase (R13.12) Attention and concentration deficit following -- Frontal lobe and executive function deficit following -- Impact on safety and function Moderate aspiration risk   CHL IP TREATMENT RECOMMENDATION 04/09/2018 Treatment Recommendations Therapy as outlined in treatment plan below   Prognosis 04/01/2018 Prognosis for Safe Diet Advancement Good Barriers to Reach Goals -- Barriers/Prognosis Comment -- CHL IP DIET RECOMMENDATION 04/09/2018 SLP Diet Recommendations Dysphagia 2 (Fine chop) solids;Nectar thick liquid Liquid Administration via Cup Medication Administration Whole meds with liquid Compensations Slow rate;Small sips/bites;Minimize environmental distractions;Monitor for anterior loss;Lingual sweep for clearance of pocketing Postural Changes Seated upright at 90 degrees   CHL IP OTHER RECOMMENDATIONS 04/09/2018 Recommended Consults -- Oral Care Recommendations Oral care before and after PO Other Recommendations Order thickener from pharmacy;Have oral suction available   CHL IP FOLLOW UP RECOMMENDATIONS 04/09/2018 Follow up Recommendations Inpatient Rehab   CHL IP FREQUENCY AND DURATION 04/01/2018 Speech Therapy Frequency (ACUTE ONLY) min 2x/week Treatment Duration 2 weeks      CHL IP ORAL PHASE 04/09/2018 Oral Phase Impaired Oral - Pudding Teaspoon -- Oral - Pudding Cup -- Oral - Honey Teaspoon -- Oral - Honey Cup -- Oral - Nectar Teaspoon Right anterior bolus loss;Weak lingual manipulation Oral - Nectar Cup Right anterior bolus loss;Lingual pumping;Delayed oral transit Oral - Nectar Straw NT Oral - Thin Teaspoon Right anterior bolus loss;Lingual pumping;Weak lingual manipulation;Incomplete tongue to palate contact;Decreased  bolus cohesion;Premature spillage;Delayed oral transit Oral - Thin Cup Right anterior bolus loss;Weak lingual manipulation;Lingual pumping;Incomplete tongue to palate contact;Delayed oral transit;Decreased bolus cohesion;Premature spillage Oral -  Thin Straw NT Oral - Puree -- Oral - Mech Soft -- Oral - Regular -- Oral - Multi-Consistency -- Oral - Pill -- Oral Phase - Comment --  CHL IP PHARYNGEAL PHASE 04/09/2018 Pharyngeal Phase Impaired Pharyngeal- Pudding Teaspoon -- Pharyngeal -- Pharyngeal- Pudding Cup -- Pharyngeal -- Pharyngeal- Honey Teaspoon -- Pharyngeal -- Pharyngeal- Honey Cup -- Pharyngeal -- Pharyngeal- Nectar Teaspoon Delayed swallow initiation-pyriform sinuses Pharyngeal -- Pharyngeal- Nectar Cup Delayed swallow initiation-pyriform sinuses Pharyngeal -- Pharyngeal- Nectar Straw NT Pharyngeal -- Pharyngeal- Thin Teaspoon Delayed swallow initiation-pyriform sinuses;Penetration/Aspiration during swallow;Moderate aspiration;Reduced tongue base retraction Pharyngeal Material enters airway, passes BELOW cords without attempt by patient to eject out (silent aspiration) Pharyngeal- Thin Cup Delayed swallow initiation-pyriform sinuses;Reduced tongue base retraction;Penetration/Aspiration during swallow;Moderate aspiration Pharyngeal Material enters airway, passes BELOW cords without attempt by patient to eject out (silent aspiration) Pharyngeal- Thin Straw NT Pharyngeal -- Pharyngeal- Puree NT Pharyngeal -- Pharyngeal- Mechanical Soft -- Pharyngeal -- Pharyngeal- Regular -- Pharyngeal -- Pharyngeal- Multi-consistency -- Pharyngeal -- Pharyngeal- Pill -- Pharyngeal -- Pharyngeal Comment --  CHL IP CERVICAL ESOPHAGEAL PHASE 04/09/2018 Cervical Esophageal Phase WFL Pudding Teaspoon -- Pudding Cup -- Honey Teaspoon -- Honey Cup -- Nectar Teaspoon -- Nectar Cup -- Nectar Straw -- Thin Teaspoon -- Thin Cup -- Thin Straw -- Puree -- Mechanical Soft -- Regular -- Multi-consistency -- Pill -- Cervical Esophageal  Comment -- Douglas [name] 04/09/2018, 12:01 PM              No results for input(s): WBC, HGB, HCT, PLT in the last 72 hours. Recent Labs    04/11/18 0659  NA 137  K 4.0  CL 101  CO2 26  GLUCOSE 154*  BUN 12  CREATININE 1.14  CALCIUM 9.3    Intake/Output Summary (Last 24 hours) at 04/11/2018 0940 Last data filed at 04/11/2018 8338 Gross per 24 hour  Intake 1875 ml  Output 450 ml  Net 1425 ml     Physical Exam: Vital Signs Blood pressure (!) 179/75, pulse 66, temperature 98 F (36.7 C), resp. rate 18, height '5\' 10"'  (1.778 m), weight 88.9 kg, SpO2 100 %.    General: No acute distress Mood and affect lethargy Heart: Regular rate and rhythm no rubs murmurs or extra sounds Lungs: Clear to auscultation, breathing unlabored, no rales or wheezes Abdomen: Positive bowel sounds, soft nontender to palpation, nondistended Extremities: No clubbing, cyanosis, or edema Skin: No evidence of breakdown, no evidence of rash Neurologic: lethargic no grip on right moderate grip on left , some antigravity on Left flaccid RLESensory exam normal sensation to light touch and proprioception in bilateral upper and lower extremities Cerebellar exam cannot assess lethargy Musculoskeletal: no pain with shoulder or LE ROM Unable to close eyes to command  Assessment/Plan: 1. Functional deficits secondary to Left MCA infarct R HP and Aphasia which require 3+ hours per day of interdisciplinary therapy in a comprehensive inpatient rehab setting.  Physiatrist is providing close team supervision and 24 hour management of active medical problems listed below.  Physiatrist and rehab team continue to assess barriers to discharge/monitor patient progress toward functional and medical goals  Care Tool:  Bathing  Bathing activity did not occur: Safety/medical concerns(night bath) Body parts bathed by patient: Face, Chest, Abdomen, Left upper leg, Front perineal area   Body parts bathed by helper: Right arm,  Chest, Left arm, Abdomen, Front perineal area, Buttocks, Right upper leg, Left upper leg, Right lower leg, Left lower leg, Face     Bathing assist Assist  Level: 2 Helpers     Upper Body Dressing/Undressing Upper body dressing   What is the patient wearing?: Pull over shirt    Upper body assist Assist Level: Moderate Assistance - Patient 50 - 74%    Lower Body Dressing/Undressing Lower body dressing      What is the patient wearing?: Pants     Lower body assist Assist for lower body dressing: 2 Helpers     Toileting Toileting    Toileting assist Assist for toileting: 2 Helpers     Transfers Chair/bed transfer  Transfers assist     Chair/bed transfer assist level: 2 Helpers     Locomotion Ambulation   Ambulation assist   Ambulation activity did not occur: Safety/medical concerns          Walk 10 feet activity   Assist  Walk 10 feet activity did not occur: Safety/medical concerns        Walk 50 feet activity   Assist Walk 50 feet with 2 turns activity did not occur: Safety/medical concerns         Walk 150 feet activity   Assist Walk 150 feet activity did not occur: Safety/medical concerns         Walk 10 feet on uneven surface  activity   Assist Walk 10 feet on uneven surfaces activity did not occur: Safety/medical concerns         Wheelchair     Assist Will patient use wheelchair at discharge?: Yes Type of Wheelchair: Manual Wheelchair activity did not occur: Safety/medical concerns         Wheelchair 50 feet with 2 turns activity    Assist    Wheelchair 50 feet with 2 turns activity did not occur: Safety/medical concerns       Wheelchair 150 feet activity     Assist Wheelchair 150 feet activity did not occur: Safety/medical concerns         Medical Problem List and Plan: 1.  Right side weakness with aphasia/dysphagia secondary to large left MCA infarction 03/30/18 as well as history of CVA  2013.aspirin and Plavix 3 months CIR  PT, OT, SLP Team conference today please see physician documentation under team conference tab, met with team face-to-face to discuss problems,progress, and goals. Formulized individual treatment plan based on medical history, underlying problem and comorbidities. 2.  DVT Prophylaxis/Anticoagulation: subcutaneous Lovenox. Monitor for any bleeding episodes 3. Pain Management:  Tylenol as needed 4. Mood: BuSpar 7.5 mg twice a day, Provide emotional support 5. Neuropsych: This patient is capable of making decisions on his own behalf. 6. Skin/Wound Care:  Routine skin checks 7. Fluids/Electrolytes/Nutrition:  Routine in and out's with follow-up chemistries poor intake ~978m, increased creat , start IVF at noc , BMET normal Takes 100% meals 8.Hypertension. Norvasc 10 mg daily, clonidine 0.2 mg daily, Toprol-XL 50 mg daily Vitals:   04/10/18 1934 04/11/18 0533  BP: (!) 165/80 (!) 179/75  Pulse: 75 66  Resp: 19 18  Temp: 99.2 F (37.3 C) 98 F (36.7 C)  SpO2: 1976%1734% systolic elevated 119/37 make further adjustment  9. Dysphagia. Dysphagia #2 nectar thick liquids. Follow-up speech therapy 10. Diabetes mellitus with peripheral neuropathy. Hemoglobin A1c 7.3. Glucotrol 5 mg twice a day,Levemir 15 units twice a day. Check blood sugars before meals and at bedtime. Diabetic teaching CBG (last 3)  Recent Labs    04/10/18 1636 04/10/18 2114 04/11/18 0627  GLUCAP 211* 174* 143*  improving control levimir  19U BID will  increase to 21U, may need to increase glucotrol to home dose of 73m  11. Hyperlipidemia. Lipitor 12.Suspect UTI. Urine study positive nitrites + bact .Begin empiric coverage-  Rocephin day 6/7 13.  Spasticity RUE and RLE start zanaflex, monitor sedation LOS: 7 days A FACE TO FACE EVALUATION WAS PERFORMED  ACharlett Blake12/03/2018, 9:40 AM

## 2018-04-11 NOTE — Patient Care Conference (Signed)
Inpatient RehabilitationTeam Conference and Plan of Care Update Date: 04/11/2018   Time: 11:15 AM    Patient Name: Douglas [name]      Medical Record Number: [id number]  Date of Birth: 1950-09-16 Sex: Male         Room/Bed: 4W22C/4W22C-01 Payor Info: Payor: MEDICARE / Plan: MEDICARE PART A AND B / Product Type: *No Product type* /    Admitting Diagnosis: CVA  Admit Date/Time:  04/04/2018  3:09 PM Admission Comments: No comment available   Primary Diagnosis:  <principal problem not specified> Principal Problem: <principal problem not specified>  Patient Active Problem List   Diagnosis Date Noted  . Cerebral edema (HCC) 04/04/2018  . Renal insufficiency 04/04/2018  . 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 Acute lower UTI   . Type 2 diabetes mellitus with peripheral neuropathy (HCC)   . Acute embolic stroke (HCC)   . Diabetes 1.5, managed as type 2 (HCC)   . Essential hypertension   . Dyslipidemia   . History of CVA with residual deficit   . Dysphagia, post-stroke     Expected Discharge Date: Expected Discharge Date: 05/02/18  Team Members Present: Physician leading conference: Dr. Claudette LawsAndrew Kirsteins Social Worker Present: Dossie DerBecky Bevin Mayall, LCSW Nurse Present: Ronny BaconWhitney Reardon, RN PT Present: Woodfin GanjaEmily Van Shagen, PT OT Present: Ardis Rowanom [name], COTA SLP Present: Feliberto Gottronourtney Payne, SLP PPS Coordinator present : Tora DuckMarie Noel, RN, CRRN;Melissa Greta DoomBowie     Current Status/Progress Goal Weekly Team Focus  Medical   Severe aphasia, level of alertness fluctuates question med related versus mood versus CVA, likely multifactorial  Improved communication, adequate fluid intake  trial of Ritalin   Bowel/Bladder   Incontinent of B/B LBM 12/10  pt will be continent of B/B normal bowel function  timed toileting laxatives prn    Swallow/Nutrition/ Hydration   Dys. 2 textures with nectar-thick liquids, Min-Mod A for safety   Supervision  Use of swallowing strategies    ADL's   tot A+2  bed mobility, mod A UB dressing, tot A+2, bathing-night bath (tot A+2); max verbal cues to attend to R, max verbal cues for task initiation  mod A overall; min A toilet transfers  attention, activity tolerance, sitting balance, sit<>stand, functinal transfers, BADL retraining   Mobility   total assist +2 with all mobility  min assist  attention, midline, activity tolerance, R NMR, standing   Communication   Max-Total A   Min A   vocalizing on command, producing single phonemes and simple CV combintations   Safety/Cognition/ Behavioral Observations  Max A  Min A  attention    Pain   Pt appears comfortable denies pain  pt will be free of pain   assess pain q shift medicate prn   Skin   skin intact  dry  pt will remain free of breakdown or infection  assess skin q shift and prn barrier cream to peri area      *See Care Plan and progress notes for long and short-term goals.     Barriers to Discharge  Current Status/Progress Possible Resolutions Date Resolved   Physician    Medical stability;Behavior     Slow progress toward goals  See above      Nursing                  PT  Incontinence;Medical stability;Decreased caregiver support                 OT  SLP                SW                Discharge Planning/Teaching Needs:  HOme with daughter who is here to provide support and begin learning his care. Daughter plans to find a handicapped apartment for the both of them in [place] upon discharge.      Team Discussion:  Goals mod assist overall. Currently total plus 2 assist. Tone is an issue-MD started spascity meds and may inject with botox later if meds do not work. Dys 2 nectar. Sitting balance good continues to push. MBS this week leaving on current diet. Daughter here daily and providing support, looking for handicapped apartment for them in [facility].   Revisions to Treatment Plan:  DC 1/1    Continued Need for Acute Rehabilitation Level of Care: The patient requires  daily medical management by a physician with specialized training in physical medicine and rehabilitation for the following conditions: Daily direction of a multidisciplinary physical rehabilitation program to ensure safe treatment while eliciting the highest outcome that is of practical value to the patient.: Yes Daily medical management of patient stability for increased activity during participation in an intensive rehabilitation regime.: Yes Daily analysis of laboratory values and/or radiology reports with any subsequent need for medication adjustment of medical intervention for : Neurological problems;Blood pressure problems;Mood/behavior problems   I attest that I was present, lead the team conference, and concur with the assessment and plan of the team.   Lucy Chris 04/11/2018, 2:17 PM

## 2018-04-11 NOTE — Progress Notes (Signed)
Physical Therapy Session Note  Patient Details  Name: Douglas CordiaWilliam L Raz Jr MRN: [id number] Date of Birth: [birth year]  Today's Date: 04/11/2018 PT Individual Time: 1400-1455 PT Individual Time Calculation (min): 55 min   Short Term Goals: Week 1:  PT Short Term Goal 1 (Week 1): Pt will tolerate 30 min of upright activity w/o increase in fatigue PT Short Term Goal 2 (Week 1): Pt will transfer max assist x1 using LRAD PT Short Term Goal 3 (Week 1): Pt will initiate gait training PT Short Term Goal 4 (Week 1): Pt will follow simple, 1-step commands 50% of the time PT Short Term Goal 5 (Week 1): Pt will perform bed mobility w/ max assist x1  Skilled Therapeutic Interventions/Progress Updates:    Pt received in therapy gym from ST, sitting in TIS w/c and agreeable to therapy tx, denies pain. Pt performed slideboard transfer to mat with total assist +2, increased time to complete secondary to impaired motor planning and apraxia. Used cones on the floor as tactile target to encourage anterior lean/weightshift. Pt seated on elevated mat in high perch position for LE weightbearing working on reaching overhead for cones and then placing cones in stacked pile, min assist and cueing for techniques. Mirror used for visual feedback this session to maintain sitting midline, upright posture and to prevent L LE extension/pushing. Pt performed x 2 sit<>stands total assist+2 via 3 musketeers, mirror for visual feedback in standing with pt pushing through L LE. Pt transferred back to TIS with total assist +2 and slideboard. Pt transported back to room and transferred to bed with slideboard total assist +2. Pt transferred to supine with max assist, left supine with needs in reach and bed alarm set.   Therapy Documentation Precautions:  Precautions Precautions: Fall Precaution Comments: dense R hemi Restrictions Weight Bearing Restrictions: No    Therapy/Group: Individual Therapy  Cresenciano GenreEmily van Schagen, PT,  DPT 04/11/2018, 7:52 AM

## 2018-04-11 NOTE — Progress Notes (Signed)
Occupational Therapy Session Note  Patient Details  Name: Douglas [name] MRN: [id number] Date of Birth: 02-Nov-1950  Today's Date: 04/11/2018 OT Individual Time: 0830-0900 OT Individual Time Calculation (min): 30 min    Short Term Goals: Week 1:  OT Short Term Goal 1 (Week 1): Pt will complete basic transfer with +1 assist in order to decrease caregiver burden OT Short Term Goal 2 (Week 1): Pt will attend to R UE during functional task with max multi-modal cuing OT Short Term Goal 3 (Week 1): Pt will locate 1 item R of midline during functional task with mod cuing  Skilled Therapeutic Interventions/Progress Updates:    Pt received in bed and smiled in agreement to begin therapy. His brief was soaked so pt worked on rolling to have brief changed.  He was able to wash his front perineal area himself, therapist A with buttocks.  Pt has severe tone in his LUE and LLE which makes rolling extremely challenging.  PROM to L hip and knee to stretch his leg. Pt has very restricted ROM due to extensor tone in leg and flexor tone in arm. From bed,  Donned pants for patient in which he did help to place his L leg in and pull over his L hip.   Worked on self ROM of arm with B hand holds with max A to guide pt.  Pt's next OT arrived and A with a +2 transfer sq pivot to w/c.  Handoff to next therapist.   Therapy Documentation Precautions:  Precautions Precautions: Fall Precaution Comments: dense R hemi Restrictions Weight Bearing Restrictions: No      Pain: no facial signs of pain        Therapy/Group: Individual Therapy  Nathen Balaban 04/11/2018, 12:09 PM

## 2018-04-12 ENCOUNTER — Inpatient Hospital Stay (HOSPITAL_COMMUNITY): Payer: Commercial Indemnity | Admitting: Occupational Therapy

## 2018-04-12 ENCOUNTER — Inpatient Hospital Stay (HOSPITAL_COMMUNITY): Payer: Commercial Indemnity

## 2018-04-12 ENCOUNTER — Inpatient Hospital Stay (HOSPITAL_COMMUNITY): Payer: Commercial Indemnity | Admitting: Speech Pathology

## 2018-04-12 ENCOUNTER — Inpatient Hospital Stay (HOSPITAL_COMMUNITY): Payer: Commercial Indemnity | Admitting: Physical Therapy

## 2018-04-12 LAB — GLUCOSE, CAPILLARY
GLUCOSE-CAPILLARY: 215 mg/dL — AB (ref 70–99)
Glucose-Capillary: 150 mg/dL — ABNORMAL HIGH (ref 70–99)
Glucose-Capillary: 220 mg/dL — ABNORMAL HIGH (ref 70–99)
Glucose-Capillary: 180 mg/dL — ABNORMAL HIGH (ref 70–99)

## 2018-04-12 MED ORDER — GLIPIZIDE 5 MG PO TABS
10.0000 mg | ORAL_TABLET | Freq: Two times a day (BID) | ORAL | Status: DC
  Administered 2018-04-12 – 2018-04-18 (×12): 10 mg via ORAL
  Filled 2018-04-12 (×14): qty 2

## 2018-04-12 NOTE — Progress Notes (Signed)
Occupational Therapy Session Note  Patient Details  Name: Douglas [name] MRN: [id number] Date of Birth: 1950-10-05  Today's Date: 04/12/2018 OT Individual Time: [id number] OT Individual Time Calculation (min): 75 min    Short Term Goals: Week 2:  OT Short Term Goal 1 (Week 2): Pt will complete basic transfer with +1 assist in order to decrease caregiver burden OT Short Term Goal 2 (Week 2): Pt will attend to R UE during functional task with max multi-modal cuing OT Short Term Goal 3 (Week 2): Pt will locate 1 item R of midline during functional task with mod cuing  Skilled Therapeutic Interventions/Progress Updates:    OT intervention with focus on toilet transfers, dressing tasks, sit<>stand, attention to R, following one step commands, and activity tolerance to increase independence with BADLs.  Pt required tot A +2 for squat pivot tranfser to toilet and back to w/c.  Pt requires max multimodal cues to initiate one step commands.  Pt requires max multimodal cues to place weight over RLE and onto RUE. Pt engaged in standing task with Huntley DecSara Plus with +2 assist for weight shifts.  Pt incontinent of bladder while standing and required tot A for hygiene.  Pt returned to room and remained in w/c with NT present.   Therapy Documentation Precautions:  Precautions Precautions: Fall Precaution Comments: dense R hemi Restrictions Weight Bearing Restrictions: No  Pain:  Pt with no s/s of pain   Therapy/Group: Individual Therapy  Douglas [name], Douglas [name] 04/12/2018, 10:45 AM

## 2018-04-12 NOTE — Progress Notes (Addendum)
Speech Language Pathology Weekly Progress and Session Note  Patient Details  Name: Jaelynn Currier MRN: [id number] Date of Birth: 02/07/1951  Beginning of progress report period: 04/12/2018  End of progress report period: 04/19/2018  Today's Date: 04/12/2018 SLP Individual Time: 1500-1520 SLP Individual Time Calculation (min): 20 min  Short Term Goals: Week 1: SLP Short Term Goal 1 (Week 1): Pt will consume therapeutic trials of thin liquids with minimal overt s/s of aspiration and min cues for use of swallowing precautions.  SLP Short Term Goal 1 - Progress (Week 1): Progressing toward goal SLP Short Term Goal 2 (Week 1): Pt will consume dys 2 textures and nectar thick liquids with minimal overt s/s of aspiration and supervision cues for use of swallowing precautions.   SLP Short Term Goal 2 - Progress (Week 1): Progressing toward goal SLP Short Term Goal 3 (Week 1): Pt will verbalize at the word level in >50% of opportunities with mod assist multimodal cues.   SLP Short Term Goal 3 - Progress (Week 1): Progressing toward goal SLP Short Term Goal 4 (Week 1): Pt will follow 1 step commands in >75% of opportunities with mod assist multimodal cues.   SLP Short Term Goal 4 - Progress (Week 1): Progressing toward goal SLP Short Term Goal 5 (Week 1): Pt will answer basic, immediately relevant yes/no questions for >50% accuracy with min assist multimodal cues.   SLP Short Term Goal 5 - Progress (Week 1): Progressing toward goal SLP Short Term Goal 6 (Week 1): Pt will match object to word from a field of three for 75% accuracy with mod assist multimodal cues.      New Short Term Goals: Week 2: SLP Short Term Goal 1 (Week 2): Pt will consume therapeutic trials of thin liquids with minimal overt s/s of aspiration and min cues for use of swallowing precautions.  SLP Short Term Goal 2 (Week 2): Pt will consume dys 2 textures and nectar thick liquids with minimal overt s/s of aspiration and min  cues for use of swallowing precautions.   SLP Short Term Goal 3 (Week 2): Pt will verbalize at the word level in >50% of opportunities with mod assist multimodal cues.   SLP Short Term Goal 4 (Week 2): Pt will follow 1 step commands in >75% of opportunities with mod assist multimodal cues.   SLP Short Term Goal 5 (Week 2): Pt will answer basic, immediately relevant yes/no questions for >50% accuracy with min assist multimodal cues.   SLP Short Term Goal 6 (Week 2): Pt will match object to word from a field of three for 75% accuracy with mod assist multimodal cues.    Weekly Progress Updates:   Pt has made limited gains this reporting period and has met 0 out of 6 short term goals.  Pt is currently max assist for tasks due to global aphasia, apraxia, and cognitive deficits.  Pt is consuming dys 2, nectar thick liquids diet with mod cues for use of swallowing precautions.  Pt and family education is ongoing.  Pt would continue to benefit from skilled ST while inpatient in order to maximize functional independence and reduce burden of care prior to discharge.  Anticipate that pt will need 24/7 supervision at discharge in addition to [place] follow up at next level of care      Intensity: Minumum of 1-2 x/day, 30 to 90 minutes Frequency: 3 to 5 out of 7 days Duration/Length of Stay: 28 days  Treatment/Interventions: Cognitive remediation/compensation;Dysphagia/aspiration  precaution training;Cueing hierarchy;Environmental controls;Internal/external aids;Multimodal communication approach;Patient/family education;Speech/Language facilitation   Daily Session  Skilled Therapeutic Interventions: Pt was seen for skilled ST targeting dysphagia goals.  Pt appeared upset upon therapist's arrival and frequently shook his head no when therapist introduced herself and informed pt that he had a therapy appointment scheduled.  Therapist facilitated the session with trials of thin liquids via teaspoon to continue working  towards diet progression.  Pt consumed 1 teaspoon of thins without overt s/s of aspiration but then began holding his hand up and would not participate in trials any further.  When attempting to engage pt in any other structured therapy task, pt would continue to hold his hand up.  Pt made little eye contact with therapist and repeatedly shook his head.  No vocalizations were appreciated on this date, spontaneous or elicited.  Pt missed remaining 10 min of therapy session due to refusal to participate.  Continue per current plan of care.      General    Pain Pain Assessment Pain Scale: Faces Faces Pain Scale: No hurt  Therapy/Group: Individual Therapy  Rawlins Stuard, Selinda Orion 04/12/2018, 2:56 PM

## 2018-04-12 NOTE — Progress Notes (Signed)
[facility] PHYSICAL MEDICINE & REHABILITATION PROGRESS NOTE   Subjective/Complaints:  Remains aphasic, discussed prognosis with OT  ROS cannot obtain due to aphasia  Objective:   No results found. No results for input(s): WBC, HGB, HCT, PLT in the last 72 hours. Recent Labs    04/11/18 0659  NA 137  K 4.0  CL 101  CO2 26  GLUCOSE 154*  BUN 12  CREATININE 1.14  CALCIUM 9.3    Intake/Output Summary (Last 24 hours) at 04/12/2018 1544 Last data filed at 04/12/2018 1500 Gross per 24 hour  Intake 1214.2 ml  Output 300 ml  Net 914.2 ml     Physical Exam: Vital Signs Blood pressure 132/71, pulse 72, temperature 97.6 F (36.4 C), resp. rate 18, height 5\' 10"  (1.778 m), weight 89.3 kg, SpO2 100 %.    General: No acute distress Mood and affect lethargy Heart: Regular rate and rhythm no rubs murmurs or extra sounds Lungs: Clear to auscultation, breathing unlabored, no rales or wheezes Abdomen: Positive bowel sounds, soft nontender to palpation, nondistended Extremities: No clubbing, cyanosis, or edema Skin: No evidence of breakdown, no evidence of rash Neurologic: lethargic no grip on right moderate grip on left , some antigravity on Left flaccid RLESensory exam normal sensation to light touch and proprioception in bilateral upper and lower extremities Cerebellar exam cannot assess lethargy Musculoskeletal: no pain with shoulder or LE ROM Unable to close eyes to command  Assessment/Plan: 1. Functional deficits secondary to Left MCA infarct R HP and Aphasia which require 3+ hours per day of interdisciplinary therapy in a comprehensive inpatient rehab setting.  Physiatrist is providing close team supervision and 24 hour management of active medical problems listed below.  Physiatrist and rehab team continue to assess barriers to discharge/monitor patient progress toward functional and medical goals  Care Tool:  Bathing  Bathing activity did not occur: Safety/medical  concerns(night bath) Body parts bathed by patient: Front perineal area   Body parts bathed by helper: Right arm, Chest, Left arm, Abdomen, Front perineal area, Buttocks, Right upper leg, Left upper leg, Right lower leg, Left lower leg, Face     Bathing assist Assist Level: 2 Helpers     Upper Body Dressing/Undressing Upper body dressing   What is the patient wearing?: Pull over shirt    Upper body assist Assist Level: Moderate Assistance - Patient 50 - 74%    Lower Body Dressing/Undressing Lower body dressing      What is the patient wearing?: Pants     Lower body assist Assist for lower body dressing: 2 Helpers     Toileting Toileting    Toileting assist Assist for toileting: 2 Helpers     Transfers Chair/bed transfer  Transfers assist     Chair/bed transfer assist level: 2 Helpers(slide board)     Locomotion Ambulation   Ambulation assist   Ambulation activity did not occur: Safety/medical concerns          Walk 10 feet activity   Assist  Walk 10 feet activity did not occur: Safety/medical concerns        Walk 50 feet activity   Assist Walk 50 feet with 2 turns activity did not occur: Safety/medical concerns         Walk 150 feet activity   Assist Walk 150 feet activity did not occur: Safety/medical concerns         Walk 10 feet on uneven surface  activity   Assist Walk 10 feet on uneven surfaces  activity did not occur: Safety/medical concerns         Wheelchair     Assist Will patient use wheelchair at discharge?: Yes Type of Wheelchair: Manual Wheelchair activity did not occur: Safety/medical concerns  Wheelchair assist level: Total Assistance - Patient < 25% Max wheelchair distance: 100'    Wheelchair 50 feet with 2 turns activity    Assist    Wheelchair 50 feet with 2 turns activity did not occur: Safety/medical concerns   Assist Level: Total Assistance - Patient < 25%   Wheelchair 150 feet  activity     Assist Wheelchair 150 feet activity did not occur: Safety/medical concerns         Medical Problem List and Plan: 1.  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  DVT Prophylaxis/Anticoagulation: subcutaneous Lovenox. Monitor for any bleeding episodes 3. Pain Management:  Tylenol as needed 4. Mood: BuSpar 7.5 mg twice a day, Provide emotional support 5. Neuropsych: This patient is capable of making decisions on his own behalf. 6. Skin/Wound Care:  Routine skin checks 7. Fluids/Electrolytes/Nutrition:  Routine in and out's with follow-up chemistries poor intake ~911ml, increased creat , start IVF at noc , BMET normal Takes 100% meals 8.Hypertension. Norvasc 10 mg daily, clonidine 0.2 mg daily, Toprol-XL 50 mg daily Vitals:   04/12/18 0416 04/12/18 1436  BP: (!) 146/70 132/71  Pulse: 67 72  Resp: 18 18  Temp: 98 F (36.7 C) 97.6 F (36.4 C)  SpO2: 100% 100%  systolic elevated 12/12  make further adjustment  9. Dysphagia. Dysphagia #2 nectar thick liquids. Follow-up speech therapy 10. Diabetes mellitus with peripheral neuropathy. Hemoglobin A1c 7.3. Glucotrol 5 mg twice a day,Levemir 15 units twice a day. Check blood sugars before meals and at bedtime. Diabetic teaching CBG (last 3)  Recent Labs    04/11/18 1629 04/12/18 0622 04/12/18 1153  GLUCAP 181* 150* 215*  improving control levimir  19U BID will increase to 21U,  need to increase glucotrol to home dose of 10mg   11. Hyperlipidemia. Lipitor 12.Suspect UTI. Urine study positive nitrites + bact .Begin empiric coverage-  Rocephin day 7/7 13.  Spasticity RUE and RLE start zanaflex, monitor sedation LOS: 8 days A FACE TO FACE EVALUATION WAS PERFORMED  Erick Colace 04/12/2018, 3:44 PM

## 2018-04-12 NOTE — Progress Notes (Signed)
Occupational Therapy Session Note  Patient Details  Name: Douglas [name] MRN: [id number] Date of Birth: 1950-10-13  Today's Date: 04/12/2018 OT Individual Time: 1345-1410 OT Individual Time Calculation (min): 25 min  and Today's Date: 04/12/2018 OT Missed Time: 20 Minutes Missed Time Reason: Patient unwilling/refused to participate without medical reason   Short Term Goals: Week 2:  OT Short Term Goal 1 (Week 2): Pt will complete basic transfer with +1 assist in order to decrease caregiver burden OT Short Term Goal 2 (Week 2): Pt will attend to R UE during functional task with max multi-modal cuing OT Short Term Goal 3 (Week 2): Pt will locate 1 item R of midline during functional task with mod cuing  Skilled Therapeutic Interventions/Progress Updates:    Session focused on toileting tasks at bed level. Pt received supine in bed, alert and looking at therapist. Upon initiation of therapy, pt began holding hand up and mumbling. Pt motioned to needing urinal. Mod A provided to position urinal while supine, max A clothing management, however pt unable to void. Attempted to begin bed mobility with pt holding up hand with each attempt. Terminated session d/t pt refusing, unable to state why. Edu pt on importance of participation in OT on CVA recovery.   Therapy Documentation Precautions:  Precautions Precautions: Fall Precaution Comments: dense R hemi Restrictions Weight Bearing Restrictions: No General: General OT Amount of Missed Time: 20 Minutes   Pain: Pain Assessment Pain Scale: Faces Faces Pain Scale: No hurt   Therapy/Group: Individual Therapy  Crissie ReeseSandra H Shirleen Mcfaul 04/12/2018, 2:15 PM

## 2018-04-12 NOTE — Progress Notes (Signed)
Physical Therapy Weekly Progress Note  Patient Details  Name: Douglas [name] MRN: [id number] Date of Birth: 04-Feb-1951  Beginning of progress report period: April 05, 2018 End of progress report period: April 12, 2018  Today's Date: 04/12/2018 PT Individual Time: 7253-6644 PT Individual Time Calculation (min): 70 min   Patient has met 2 of 5 short term goals. Pt has made limited progress towards LTGs over last week 2/2 decreased OOB tolerance, severely impaired midline orientation, motor apraxia, and decreased command following. He continues to require +2 assist for all upright mobility and max multimodal cues for initiation and apraxia. Despite this, he does demonstrate some carryover from session to session while performing functional movement patterns and is able to meaningfully participate in therapy sessions.   Patient continues to demonstrate the following deficits muscle weakness and muscle joint tightness, decreased cardiorespiratoy endurance, impaired timing and sequencing, abnormal tone, unbalanced muscle activation, motor apraxia, decreased coordination and decreased motor planning, decreased midline orientation and decreased attention to right, decreased initiation, decreased attention, decreased awareness, decreased problem solving, decreased safety awareness, decreased memory and delayed processing and decreased sitting balance, decreased standing balance, decreased postural control, hemiplegia and decreased balance strategies and therefore will continue to benefit from skilled PT intervention to increase functional independence with mobility.  Patient progressing toward long term goals..  Continue plan of care.  PT Short Term Goals Week 1:  PT Short Term Goal 1 (Week 1): Pt will tolerate 30 min of upright activity w/o increase in fatigue PT Short Term Goal 1 - Progress (Week 1): Met PT Short Term Goal 2 (Week 1): Pt will transfer max assist x1 using LRAD PT Short Term  Goal 2 - Progress (Week 1): Progressing toward goal PT Short Term Goal 3 (Week 1): Pt will initiate gait training PT Short Term Goal 3 - Progress (Week 1): Progressing toward goal PT Short Term Goal 4 (Week 1): Pt will follow simple, 1-step commands 50% of the time PT Short Term Goal 4 - Progress (Week 1): Met PT Short Term Goal 5 (Week 1): Pt will perform bed mobility w/ max assist x1 PT Short Term Goal 5 - Progress (Week 1): Met Week 2:  PT Short Term Goal 1 (Week 2): Pt will transfer max assist x1 using LRAD PT Short Term Goal 2 (Week 2): Pt will initiate gait training PT Short Term Goal 3 (Week 2): Pt will self-propel manual w/c 50' w/ mod assist  PT Short Term Goal 4 (Week 2): Pt will maintain static sitting balance w/ supervision PT Short Term Goal 5 (Week 2): Pt will transfer sit<>stand w/ max assist x1  Skilled Therapeutic Interventions/Progress Updates:   Pt asleep in supine, easily woken up and agreeable to therapy, no c/o or evidence of pain during session. Supine to sit w/ max assist +1, total assist +2 slide board transfer to manual w/c. Manual and verbal cues for anterior weight shifting during transfer. Attempted to work on sit<>stands at rail in hallway +2 assist. Unable to prevent L pushing w/ LUE to boost into standing. Transitioned to sit<>stands at standing frame x2 reps, 6-8 minutes each, to work on RLE WB and midline orientation. Worked on L weight shifting in standing while reaching for cups at various eye levels. Max verbal, tactile, and manual cues to initiate 75% of the time. Self-propelled w/c back to room, ~100', via L hemi technique to work on independence w/ locomotion. Pt ultimately performed 20-25% himself w/ therapist providing manual assist on R  side to assist w/ steering. Max verbal, visual, tactile, and manual cues for technique throughout. Ended session in w/c and in care of daughter/NT, all needs met.   Therapy Documentation Precautions:   Precautions Precautions: Fall Precaution Comments: dense R hemi Restrictions Weight Bearing Restrictions: No  Therapy/Group: Individual Therapy  Shatasia Cutshaw Clent Demark 04/12/2018, 12:25 PM

## 2018-04-12 NOTE — Progress Notes (Signed)
Occupational Therapy Weekly Progress Note  Patient Details  Name: Douglas [name] MRN: [id number] Date of Birth: [birth year]  Beginning of progress report period: April 05, 2018 End of progress report period: April 12, 2018  Patient has met 0 of 3 short term goals. Pt is progressing towards STG but progress is slow and inconsistent.  Pt continues to require tot multimodal cues to attend to his RUE and does not attempt to engage in functional tasks.  Pt inconsistent with yes/no responses to questions.  Pt follows one step commands with approx 25% accuracy and max multimodal cues.  Pt currently requires tot A+2 for squat pivot transfers.  Pt initiates dressing tasks but requires max multimodal cues for bathing except washing face.   Patient continues to demonstrate the following deficits: muscle weakness and communication deficits, decreased cardiorespiratoy endurance, impaired timing and sequencing, abnormal tone, unbalanced muscle activation, motor apraxia, decreased coordination and decreased motor planning, decreased visual perceptual skills and decreased visual motor skills, decreased midline orientation, decreased attention to right and decreased motor planning, decreased initiation, decreased attention, decreased awareness, decreased problem solving and decreased safety awareness and decreased sitting balance, decreased standing balance, decreased postural control, hemiplegia and decreased balance strategies and therefore will continue to benefit from skilled OT intervention to enhance overall performance with BADL and Reduce care partner burden.  Patient progressing toward long term goals..  Continue plan of care.  OT Short Term Goals Week 1:  OT Short Term Goal 1 (Week 1): Pt will complete basic transfer with +1 assist in order to decrease caregiver burden OT Short Term Goal 1 - Progress (Week 1): Progressing toward goal OT Short Term Goal 2 (Week 1): Pt will attend to R UE during  functional task with max multi-modal cuing OT Short Term Goal 2 - Progress (Week 1): Progressing toward goal OT Short Term Goal 3 (Week 1): Pt will locate 1 item R of midline during functional task with mod cuing OT Short Term Goal 3 - Progress (Week 1): Progressing toward goal Week 2:  OT Short Term Goal 1 (Week 2): Pt will complete basic transfer with +1 assist in order to decrease caregiver burden OT Short Term Goal 2 (Week 2): Pt will attend to R UE during functional task with max multi-modal cuing OT Short Term Goal 3 (Week 2): Pt will locate 1 item R of midline during functional task with mod cuing      Therapy Documentation Precautions:  Precautions Precautions: Fall Precaution Comments: dense R hemi Restrictions Weight Bearing Restrictions: No   Therapy/Group:   [name] 04/12/2018, 7:01 AM

## 2018-04-13 ENCOUNTER — Inpatient Hospital Stay (HOSPITAL_COMMUNITY): Payer: Commercial Indemnity | Admitting: Speech Pathology

## 2018-04-13 ENCOUNTER — Inpatient Hospital Stay (HOSPITAL_COMMUNITY): Payer: Commercial Indemnity | Admitting: Physical Therapy

## 2018-04-13 ENCOUNTER — Inpatient Hospital Stay (HOSPITAL_COMMUNITY): Payer: Commercial Indemnity | Admitting: *Deleted

## 2018-04-13 LAB — GLUCOSE, CAPILLARY
GLUCOSE-CAPILLARY: 178 mg/dL — AB (ref 70–99)
Glucose-Capillary: 241 mg/dL — ABNORMAL HIGH (ref 70–99)
Glucose-Capillary: 247 mg/dL — ABNORMAL HIGH (ref 70–99)
Glucose-Capillary: 127 mg/dL — ABNORMAL HIGH (ref 70–99)
Glucose-Capillary: 152 mg/dL — ABNORMAL HIGH (ref 70–99)
Glucose-Capillary: 201 mg/dL — ABNORMAL HIGH (ref 70–99)

## 2018-04-13 NOTE — Progress Notes (Signed)
Speech Language Pathology Daily Session Note  Patient Details  Name: Douglas [name] MRN: [id number] Date of Birth: 1950-08-15  Today's Date: 04/13/2018 SLP Individual Time: 1430-1455 SLP Individual Time Calculation (min): 25 min  Short Term Goals: Week 2: SLP Short Term Goal 1 (Week 2): Pt will consume therapeutic trials of thin liquids with minimal overt s/s of aspiration and min cues for use of swallowing precautions.  SLP Short Term Goal 2 (Week 2): Pt will consume dys 2 textures and nectar thick liquids with minimal overt s/s of aspiration and min cues for use of swallowing precautions.   SLP Short Term Goal 3 (Week 2): Pt will verbalize at the word level in >50% of opportunities with mod assist multimodal cues.   SLP Short Term Goal 4 (Week 2): Pt will follow 1 step commands in >75% of opportunities with mod assist multimodal cues.   SLP Short Term Goal 5 (Week 2): Pt will answer basic, immediately relevant yes/no questions for >50% accuracy with min assist multimodal cues.   SLP Short Term Goal 6 (Week 2): Pt will match object to word from a field of three for 75% accuracy with mod assist multimodal cues.    Skilled Therapeutic Interventions: Skilled treatment session focused on communication goals. SLP facilitated session by providing Max A multimodal cues for patient to vocalize at the phoneme level. Patient unable to consistently and vocalized in only 20% of opportunities. Patient also required Max A multimodal cues to follow basic commands not within context with 30% accuracy. Suspect patient's function impacted by fatigue this session. Patient left upright in bed with alarm on and all needs within reach. Continue with current plan of care.      Pain No/Denies Pain   Therapy/Group: Individual Therapy  Lastacia Solum 04/13/2018, 3:20 PM

## 2018-04-13 NOTE — Progress Notes (Signed)
Physical Therapy Session Note  Patient Details  Name: Douglas [name] MRN: [id number] Date of Birth: 11/19/1950  Today's Date: 04/13/2018 PT Individual Time: 0820-0900 PT Individual Time Calculation (min): 40 min   Short Term Goals: Week 2:  PT Short Term Goal 1 (Week 2): Pt will transfer max assist x1 using LRAD PT Short Term Goal 2 (Week 2): Pt will initiate gait training PT Short Term Goal 3 (Week 2): Pt will self-propel manual w/c 50' w/ mod assist  PT Short Term Goal 4 (Week 2): Pt will maintain static sitting balance w/ supervision PT Short Term Goal 5 (Week 2): Pt will transfer sit<>stand w/ max assist x1  Skilled Therapeutic Interventions/Progress Updates:    Patient received in bed, willing to participate in therapy. TotalA to don hospital socks, MaxA-totalAx1 for supine to sit with HOB elevated, did participate initially by moving L LE but then increased to totalA to complete transfer. MaxA to maintain sitting balance at EOB this morning due to strong posterior lean and pushing with L UE, appeared anxious about mobility this morning. Attempted sliding board but unable due to patient's strong posterior lean, instead used bari-Stedy with PT, PT tech, and NT for safety due to strong lean/pushing today. [facility] in [facility]. [facility] and stood for 1-2 minutes in standing frame with 4 inch box under forearms to improve standing posture- otherwise tends to slump and cannot correct- and time in standing frame limited by time restraints of session. Left patient in TIS WC in care of PT tech preparing for next therapy session.   Therapy Documentation Precautions:  Precautions Precautions: Fall Precaution Comments: dense R hemi Restrictions Weight Bearing Restrictions: No General: PT Amount of Missed Time (min): 20 Minutes PT Missed Treatment Reason: Unavailable (Comment);Nursing care(just waking up and starting to eat  breakfast, getting meds from nursing ) Pain: Pain Assessment Pain Scale: Faces Pain Score: 0-No pain Faces Pain Scale: No hurt    Therapy/Group: Individual Therapy  Nedra HaiKristen Levana Minetti PT, DPT, CBIS  Supplemental Physical Therapist [facility]    Pager [phone] Acute Rehab Office [phone]   04/13/2018, 9:19 AM

## 2018-04-13 NOTE — Progress Notes (Signed)
Occupational Therapy Session Note  Patient Details  Name: Douglas [name] MRN: [id number] Date of Birth: 12-16-1950  Today's Date: 04/13/2018 OT Individual Time: 0900-1015 OT Individual Time Calculation (min): 75 min    Short Term Goals: Week 2:  OT Short Term Goal 1 (Week 2): Pt will complete basic transfer with +1 assist in order to decrease caregiver burden OT Short Term Goal 2 (Week 2): Pt will attend to R UE during functional task with max multi-modal cuing OT Short Term Goal 3 (Week 2): Pt will locate 1 item R of midline during functional task with mod cuing  Skilled Therapeutic Interventions/Progress Updates:    OT intervention with focus on UB bathing, dressing, sit<>stand, task initiation, sequencing, attention to R, attention to task, and activity tolerance to increase independence with BADLs.  Pt perseverates on washing face and required max multimodal cues to bathe UB.  Pt initiates dressing tasks but requires max multimodal cues to attend to his R and max A to thread RUE and pull over trunk.  Pt requires tot A + 2 for sit<>stand and pulling pants over hips.  Pt continues to push through his LUE when provided opportunity but is able to push through BLE into standing when LUE placed in neutral position.  Attempted to engage pt in shaving activity but required max multimodal cues to attend to task.  Pt remained seated in TIS with all needs within reach and belt alarm activated.   Therapy Documentation Precautions:  Precautions Precautions: Fall Precaution Comments: dense R hemi Restrictions Weight Bearing Restrictions: No   Pain: Pain Assessment Pain Scale: Faces Pain Score: 0-No pain Faces Pain Scale: No hurt  Therapy/Group: Individual Therapy  Rich BraveLanier, [name] 04/13/2018, 10:15 AM

## 2018-04-13 NOTE — Progress Notes (Signed)
Physical Therapy Session Note  Patient Details  Name: Douglas [name] MRN: [id number] Date of Birth: February 16, 1951  Today's Date: 04/13/2018 PT Individual Time: 1100-1200 PT Individual Time Calculation (min): 60 min   Short Term Goals: Week 2:  PT Short Term Goal 1 (Week 2): Pt will transfer max assist x1 using LRAD PT Short Term Goal 2 (Week 2): Pt will initiate gait training PT Short Term Goal 3 (Week 2): Pt will self-propel manual w/c 50' w/ mod assist  PT Short Term Goal 4 (Week 2): Pt will maintain static sitting balance w/ supervision PT Short Term Goal 5 (Week 2): Pt will transfer sit<>stand w/ max assist x1  Skilled Therapeutic Interventions/Progress Updates:   Pt in TIS and appeared agreeable to therapy, no evidence or c/o pain. Pt participating minimally w/ verbalizations and/or facial expressions this session. SB transfer to edge of mat x2 helpers w/ multimodal cues for technique and weight shifting. Maintained sitting balance w/ close supervision to CGA for 10 min x2 while participating in LUE reaching tasks towards L to facilitate decreased R pushing tendencies. Max multimodal cues to initiate, attend to task, and successfully complete task. Set-up for SB transfer back to w/c when pt initiated lateral scoot w/o prompting, completed max assist +2 lateral scoot to w/c w/o slide board. Gait training on Litegait, +2 helpers to stand (3-musketeers style) and 3rd helper doing brace set-up. Ambulated 60' w/ max manual assist for L lateral weight shifting during R single limb stance and total assist for RLE step placement. Pt w/ some initiation of lateral weight shifting in both directions during gait and required max fading to min verbal cues to take L step 2/2 fear of putting weight on RLE. Returned to room via w/c, ended session resting in TIS and all needs met.   Therapy Documentation Precautions:  Precautions Precautions: Fall Precaution Comments: dense R hemi Restrictions Weight  Bearing Restrictions: No General: PT Amount of Missed Time (min): 20 Minutes PT Missed Treatment Reason: Unavailable (Comment);Nursing care(just waking up and starting to eat breakfast, getting meds from nursing ) Pain: Pain Assessment Pain Scale: Faces Pain Score: 0-No pain Faces Pain Scale: No hurt  Therapy/Group: Individual Therapy  Micky Sheller Clent Demark 04/13/2018, 12:06 PM

## 2018-04-13 NOTE — Progress Notes (Addendum)
[place] PHYSICAL MEDICINE & REHABILITATION PROGRESS NOTE   Subjective/Complaints:  Remains aphasic, just received ritalin, still drowsy  ROS cannot obtain due to aphasia  Objective:   No results found. No results for input(s): WBC, HGB, HCT, PLT in the last 72 hours. Recent Labs    04/11/18 0659  NA 137  K 4.0  CL 101  CO2 26  GLUCOSE 154*  BUN 12  CREATININE 1.14  CALCIUM 9.3    Intake/Output Summary (Last 24 hours) at 04/13/2018 1639 Last data filed at 04/13/2018 1252 Gross per 24 hour  Intake 1705 ml  Output -  Net 1705 ml     Physical Exam: Vital Signs Blood pressure 138/78, pulse 71, temperature 97.7 F (36.5 C), resp. rate 18, height 5\' 10"  (1.778 m), weight 89.3 kg, SpO2 100 %.    General: No acute distress Mood and affect lethargy Heart: Regular rate and rhythm no rubs murmurs or extra sounds Lungs: Clear to auscultation, breathing unlabored, no rales or wheezes Abdomen: Positive bowel sounds, soft nontender to palpation, nondistended Extremities: No clubbing, cyanosis, or edema Skin: No evidence of breakdown, no evidence of rash Neurologic: lethargic no grip on right moderate grip on left , some antigravity on Left flaccid RLESensory exam normal sensation to light touch and proprioception in bilateral upper and lower extremities Cerebellar exam cannot assess lethargy Musculoskeletal: no pain with shoulder or LE ROM Unable to close eyes to command  Assessment/Plan: 1. Functional deficits secondary to Left MCA infarct R HP and Aphasia which require 3+ hours per day of interdisciplinary therapy in a comprehensive inpatient rehab setting.  Physiatrist is providing close team supervision and 24 hour management of active medical problems listed below.  Physiatrist and rehab team continue to assess barriers to discharge/monitor patient progress toward functional and medical goals  Care Tool:  Bathing  Bathing activity did not occur: Safety/medical  concerns(night bath) Body parts bathed by patient: Front perineal area   Body parts bathed by helper: Right arm, Left arm, Abdomen, Chest, Front perineal area, Buttocks, Right upper leg, Left upper leg, Right lower leg, Left lower leg, Face(pt refuses to help will not take face cloth)     Bathing assist Assist Level: 2 Helpers     Upper Body Dressing/Undressing Upper body dressing   What is the patient wearing?: Hospital gown only    Upper body assist Assist Level: Maximal Assistance - Patient 25 - 49%    Lower Body Dressing/Undressing Lower body dressing      What is the patient wearing?: Pants     Lower body assist Assist for lower body dressing: 2 Helpers     Toileting Toileting    Toileting assist Assist for toileting: Total Assistance - Patient < 25%     Transfers Chair/bed transfer  Transfers assist     Chair/bed transfer assist level: 2 Helpers(slide board)     Locomotion Ambulation   Ambulation assist   Ambulation activity did not occur: Safety/medical concerns  Assist level: Dependent - Patient 0% Assistive device: Lite Gait Max distance: 50'   Walk 10 feet activity   Assist  Walk 10 feet activity did not occur: Safety/medical concerns  Assist level: Dependent - Patient 0% Assistive device: Lite Gait   Walk 50 feet activity   Assist Walk 50 feet with 2 turns activity did not occur: Safety/medical concerns  Assist level: Dependent - Patient 0% Assistive device: Lite Gait    Walk 150 feet activity   Assist Walk 150 feet  activity did not occur: Safety/medical concerns         Walk 10 feet on uneven surface  activity   Assist Walk 10 feet on uneven surfaces activity did not occur: Safety/medical concerns         Wheelchair     Assist Will patient use wheelchair at discharge?: Yes Type of Wheelchair: Manual Wheelchair activity did not occur: Safety/medical concerns  Wheelchair assist level: Total Assistance -  Patient < 25% Max wheelchair distance: 100'    Wheelchair 50 feet with 2 turns activity    Assist    Wheelchair 50 feet with 2 turns activity did not occur: Safety/medical concerns   Assist Level: Total Assistance - Patient < 25%   Wheelchair 150 feet activity     Assist Wheelchair 150 feet activity did not occur: Safety/medical concerns   Assist Level: Total Assistance - Patient < 25%     Medical Problem List and Plan: 1.  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  DVT Prophylaxis/Anticoagulation: subcutaneous Lovenox. Monitor for any bleeding episodes 3. Pain Management:  Tylenol as needed 4. Mood: BuSpar 7.5 mg twice a day, Provide emotional support 5. Neuropsych: This patient is capable of making decisions on his own behalf. 6. Skin/Wound Care:  Routine skin checks 7. Fluids/Electrolytes/Nutrition:  Routine in and out's with follow-up chemistries poor intake increased creat  IVF at noc ,  Takes 100% meals 8.Hypertension. Norvasc 10 mg daily, clonidine 0.2 mg daily, Toprol-XL 50 mg daily Vitals:   04/13/18 0539 04/13/18 1405  BP: 120/64 138/78  Pulse: 62 71  Resp: 18 18  Temp: 97.7 F (36.5 C) 97.7 F (36.5 C)  SpO2: 98% 100%  BP controlled 12/13  9. Dysphagia. Dysphagia #2 nectar thick liquids. Follow-up speech therapy 10. Diabetes mellitus with peripheral neuropathy. Hemoglobin A1c 7.3. Glucotrol 5 mg twice a day,Levemir 15 units twice a day. Check blood sugars before meals and at bedtime. Diabetic teaching CBG (last 3)  Recent Labs    04/13/18 0627 04/13/18 1209 04/13/18 1610  GLUCAP 127* 152* 201*  improving control levimir  19U BID will increase to 21U,  need to increase glucotrol to home dose of 10mg   11. Hyperlipidemia. Lipitor 12.Suspect UTI. Urine study positive nitrites + bact .Begin empiric coverage-  Off abx 13.   Spasticity RUE and RLE start zanaflex, monitor sedation LOS: 9 days A FACE TO FACE EVALUATION WAS PERFORMED  Erick Colacendrew E Alysha Doolan 04/13/2018, 4:39 PM

## 2018-04-14 DIAGNOSIS — I10 Essential (primary) hypertension: Secondary | ICD-10-CM

## 2018-04-14 DIAGNOSIS — E669 Obesity, unspecified: Secondary | ICD-10-CM

## 2018-04-14 DIAGNOSIS — E1169 Type 2 diabetes mellitus with other specified complication: Secondary | ICD-10-CM

## 2018-04-14 LAB — GLUCOSE, CAPILLARY
GLUCOSE-CAPILLARY: 175 mg/dL — AB (ref 70–99)
Glucose-Capillary: 136 mg/dL — ABNORMAL HIGH (ref 70–99)
Glucose-Capillary: 175 mg/dL — ABNORMAL HIGH (ref 70–99)
Glucose-Capillary: 157 mg/dL — ABNORMAL HIGH (ref 70–99)

## 2018-04-14 NOTE — Progress Notes (Signed)
[place] PHYSICAL MEDICINE & REHABILITATION PROGRESS NOTE   Subjective/Complaints:  Awake. No new new issues per rn  ROS: limited due to language/communication    Objective:   No results found. No results for input(s): WBC, HGB, HCT, PLT in the last 72 hours. No results for input(s): NA, K, CL, CO2, GLUCOSE, BUN, CREATININE, CALCIUM in the last 72 hours.  Intake/Output Summary (Last 24 hours) at 04/14/2018 1231 Last data filed at 04/14/2018 1610 Gross per 24 hour  Intake 520 ml  Output 150 ml  Net 370 ml     Physical Exam: Vital Signs Blood pressure 131/81, pulse 67, temperature 97.7 F (36.5 C), resp. rate 18, height 5\' 10"  (1.778 m), weight 89.3 kg, SpO2 100 %.    Constitutional: No distress . Vital signs reviewed. HEENT: EOMI, oral membranes moist Neck: supple Cardiovascular: RRR without murmur. No JVD    Respiratory: CTA Bilaterally without wheezes or rales. Normal effort    GI: BS +, non-tender, non-distended  Extremities: No clubbing, cyanosis, or edema Skin: No evidence of breakdown, no evidence of rash Neurologic:  Right hemiparesis. Moves left side fairly freely. More alert. Makes eye contact when aroused.  Musculoskeletal: no pain with shoulder or LE ROM Unable to close eyes to command    Assessment/Plan: 1. Functional deficits secondary to Left MCA infarct R HP and Aphasia which require 3+ hours per day of interdisciplinary therapy in a comprehensive inpatient rehab setting.  Physiatrist is providing close team supervision and 24 hour management of active medical problems listed below.  Physiatrist and rehab team continue to assess barriers to discharge/monitor patient progress toward functional and medical goals  Care Tool:  Bathing  Bathing activity did not occur: Safety/medical concerns(night bath) Body parts bathed by patient: Front perineal area   Body parts bathed by helper: Right arm, Left arm, Abdomen, Chest, Front perineal area,  Buttocks, Right upper leg, Left upper leg, Right lower leg, Left lower leg, Face(pt refuses to help will not take face cloth)     Bathing assist Assist Level: 2 Helpers     Upper Body Dressing/Undressing Upper body dressing   What is the patient wearing?: Hospital gown only    Upper body assist Assist Level: Maximal Assistance - Patient 25 - 49%    Lower Body Dressing/Undressing Lower body dressing      What is the patient wearing?: Pants     Lower body assist Assist for lower body dressing: 2 Helpers     Toileting Toileting    Toileting assist Assist for toileting: Total Assistance - Patient < 25%     Transfers Chair/bed transfer  Transfers assist     Chair/bed transfer assist level: 2 Helpers     Locomotion Ambulation   Ambulation assist   Ambulation activity did not occur: Safety/medical concerns  Assist level: Dependent - Patient 0% Assistive device: Lite Gait Max distance: 50'   Walk 10 feet activity   Assist  Walk 10 feet activity did not occur: Safety/medical concerns  Assist level: Dependent - Patient 0% Assistive device: Lite Gait   Walk 50 feet activity   Assist Walk 50 feet with 2 turns activity did not occur: Safety/medical concerns  Assist level: Dependent - Patient 0% Assistive device: Lite Gait    Walk 150 feet activity   Assist Walk 150 feet activity did not occur: Safety/medical concerns         Walk 10 feet on uneven surface  activity   Assist Walk 10 feet on  uneven surfaces activity did not occur: Safety/medical concerns         Wheelchair     Assist Will patient use wheelchair at discharge?: Yes Type of Wheelchair: Manual Wheelchair activity did not occur: Safety/medical concerns  Wheelchair assist level: Total Assistance - Patient < 25% Max wheelchair distance: 100'    Wheelchair 50 feet with 2 turns activity    Assist    Wheelchair 50 feet with 2 turns activity did not occur:  Safety/medical concerns   Assist Level: Total Assistance - Patient < 25%   Wheelchair 150 feet activity     Assist Wheelchair 150 feet activity did not occur: Safety/medical concerns   Assist Level: Total Assistance - Patient < 25%     Medical Problem List and Plan: 1.  Right side weakness with aphasia/dysphagia secondary to large left MCA infarction 03/30/18 as well as history of CVA 2013.aspirin and Plavix 3 months CIR  PT, OT, SLP  2.  DVT Prophylaxis/Anticoagulation: subcutaneous Lovenox. Monitor for any bleeding episodes 3. Pain Management:  Tylenol as needed 4. Mood: BuSpar 7.5 mg twice a day, Provide emotional support  -ritalin to improve arousal 5. Neuropsych: This patient is capable of making decisions on his own behalf. 6. Skin/Wound Care:  Routine skin checks 7. Fluids/Electrolytes/Nutrition:  Routine in and out's with follow-up chemistries poor intake increased creat   -continue IVF at noc ,   -eating well 8.Hypertension. Norvasc 10 mg daily, clonidine 0.2 mg daily, Toprol-XL 50 mg daily Vitals:   04/13/18 1944 04/14/18 0504  BP: (!) 154/81 131/81  Pulse: 94 67  Resp: 18 18  Temp: 98.2 F (36.8 C) 97.7 F (36.5 C)  SpO2: 100% 100%  BP controlled 12/14  9. Dysphagia. Dysphagia #2 nectar thick liquids. Follow-up speech therapy 10. Diabetes mellitus with peripheral neuropathy. Hemoglobin A1c 7.3. Glucotrol 5 mg twice a day,Levemir 15 units twice a day. Check blood sugars before meals and at bedtime. Diabetic teaching CBG (last 3)  Recent Labs    04/13/18 2115 04/14/18 0625 04/14/18 1153  GLUCAP 178* 136* 175*  improving control. levimir   21U BID,   glucotrol at home dose of 10mg    11. Hyperlipidemia. Lipitor 12.Suspect UTI. Urine study positive nitrites + bact .Begin empiric coverage-  Off abx 13.  Spasticity RUE and RLE --rom, tizanidine stopped due to sedation   LOS: 10 days A FACE TO FACE EVALUATION WAS PERFORMED  Ranelle OysterZachary T Michele Judy 04/14/2018,  12:31 PM

## 2018-04-15 ENCOUNTER — Inpatient Hospital Stay (HOSPITAL_COMMUNITY): Payer: Commercial Indemnity | Admitting: Occupational Therapy

## 2018-04-15 LAB — GLUCOSE, CAPILLARY
Glucose-Capillary: 95 mg/dL (ref 70–99)
Glucose-Capillary: 126 mg/dL — ABNORMAL HIGH (ref 70–99)

## 2018-04-15 MED ORDER — METHYLPHENIDATE HCL 5 MG PO TABS
10.0000 mg | ORAL_TABLET | Freq: Two times a day (BID) | ORAL | Status: DC
  Administered 2018-04-15 – 2018-05-04 (×37): 10 mg via ORAL
  Filled 2018-04-15 (×37): qty 2

## 2018-04-15 NOTE — Progress Notes (Signed)
Occupational Therapy Session Note  Patient Details  Name: Douglas [name] MRN: [id number] Date of Birth: April 14, 1951  Today's Date: 04/15/2018 OT Group Time: 1101-1201 OT Group Time Calculation (min): 60 min  Skilled Therapeutic Interventions/Progress Updates:    Pt engaged in therapeutic w/c level dance group focusing on patient choice, UE/LE strengthening, salience, activity tolerance, and social participation. Pt was guided through various dance-based exercises involving UEs/LEs and trunk. All music was selected by group members. Emphasis placed on sustained attention, motor planning, Rt attention, and initiation. [facility] and [facility] facilitation for self ROM exercises. Once OT started him off, he was able to continue with exercise for 10 second windows before needing manual guidance again. Pt initiated tapping Lt foot during multiple songs. Encouraged him to sing along to familiar Christmas songs, however pt unable to mouth words or produce vocalization. He initiated head turn/scan to Rt to watch a group member stand to dance! At end of session he was taken back to room, reclined in TIS, and left with safety belt fastened and call bell in lap.   Before OT left, asked him if he had fun and he nodded head while squeezing OT's hand.      Therapy Documentation Precautions:  Precautions Precautions: Fall Precaution Comments: dense R hemi Restrictions Weight Bearing Restrictions: No Vital Signs: Therapy Vitals BP: (!) 150/74 Pain: No s/s pain during tx    ADL:       Therapy/Group: Group Therapy  Rasheedah Reis A Rhylin Venters 04/15/2018, 12:48 PM

## 2018-04-15 NOTE — Progress Notes (Signed)
[place] PHYSICAL MEDICINE & REHABILITATION PROGRESS NOTE   Subjective/Complaints:  Pt slept fairly well. Sleeping when I arrived. Awoke easily  ROS: limited due to language/communication    Objective:   No results found. No results for input(s): WBC, HGB, HCT, PLT in the last 72 hours. No results for input(s): NA, K, CL, CO2, GLUCOSE, BUN, CREATININE, CALCIUM in the last 72 hours.  Intake/Output Summary (Last 24 hours) at 04/15/2018 1034 Last data filed at 04/15/2018 0806 Gross per 24 hour  Intake 840 ml  Output 200 ml  Net 640 ml     Physical Exam: Vital Signs Blood pressure (!) 150/74, pulse 63, temperature 97.7 F (36.5 C), temperature source Oral, resp. rate 18, height 5\' 10"  (1.778 m), weight 89.1 kg, SpO2 100 %.    Constitutional: No distress . Vital signs reviewed. HEENT: EOMI, oral membranes moist Neck: supple Cardiovascular: RRR without murmur. No JVD    Respiratory: CTA Bilaterally without wheezes or rales. Normal effort    GI: BS +, non-tender, non-distended  Extremities: No clubbing, cyanosis, or edema Skin: No evidence of breakdown, no evidence of rash Neurologic:  Right hemiparesis. Moves left side fairly freely. More alert. Makes eye contact when aroused. aphasic Musculoskeletal: no pain with shoulder or LE ROM     Assessment/Plan: 1. Functional deficits secondary to Left MCA infarct R HP and Aphasia which require 3+ hours per day of interdisciplinary therapy in a comprehensive inpatient rehab setting.  Physiatrist is providing close team supervision and 24 hour management of active medical problems listed below.  Physiatrist and rehab team continue to assess barriers to discharge/monitor patient progress toward functional and medical goals  Care Tool:  Bathing  Bathing activity did not occur: Safety/medical concerns(night bath) Body parts bathed by patient: Front perineal area   Body parts bathed by helper: Right arm, Left arm, Abdomen,  Chest, Front perineal area, Buttocks, Right upper leg, Left upper leg, Right lower leg, Left lower leg, Face(pt refuses to help will not take face cloth)     Bathing assist Assist Level: 2 Helpers     Upper Body Dressing/Undressing Upper body dressing   What is the patient wearing?: Hospital gown only    Upper body assist Assist Level: Maximal Assistance - Patient 25 - 49%    Lower Body Dressing/Undressing Lower body dressing      What is the patient wearing?: Pants     Lower body assist Assist for lower body dressing: 2 Helpers     Toileting Toileting    Toileting assist Assist for toileting: Total Assistance - Patient < 25%     Transfers Chair/bed transfer  Transfers assist     Chair/bed transfer assist level: 2 Helpers     Locomotion Ambulation   Ambulation assist   Ambulation activity did not occur: Safety/medical concerns  Assist level: Dependent - Patient 0% Assistive device: Lite Gait Max distance: 50'   Walk 10 feet activity   Assist  Walk 10 feet activity did not occur: Safety/medical concerns  Assist level: Dependent - Patient 0% Assistive device: Lite Gait   Walk 50 feet activity   Assist Walk 50 feet with 2 turns activity did not occur: Safety/medical concerns  Assist level: Dependent - Patient 0% Assistive device: Lite Gait    Walk 150 feet activity   Assist Walk 150 feet activity did not occur: Safety/medical concerns         Walk 10 feet on uneven surface  activity   Assist Walk 10  feet on uneven surfaces activity did not occur: Safety/medical concerns         Wheelchair     Assist Will patient use wheelchair at discharge?: Yes Type of Wheelchair: Manual Wheelchair activity did not occur: Safety/medical concerns  Wheelchair assist level: Total Assistance - Patient < 25% Max wheelchair distance: 100'    Wheelchair 50 feet with 2 turns activity    Assist    Wheelchair 50 feet with 2 turns activity  did not occur: Safety/medical concerns   Assist Level: Total Assistance - Patient < 25%   Wheelchair 150 feet activity     Assist Wheelchair 150 feet activity did not occur: Safety/medical concerns   Assist Level: Total Assistance - Patient < 25%     Medical Problem List and Plan: 1.  Right side weakness with aphasia/dysphagia secondary to large left MCA infarction 03/30/18 as well as history of CVA 2013.aspirin and Plavix 3 months  CIR  PT, OT, SLP  2.  DVT Prophylaxis/Anticoagulation: subcutaneous Lovenox. Monitor for any bleeding episodes 3. Pain Management:  Tylenol as needed 4. Mood: BuSpar 7.5 mg twice a day, Provide emotional support  -ritalin to improve arousal, increase to 10mg  5. Neuropsych: This patient is capable of making decisions on his own behalf. 6. Skin/Wound Care:  Routine skin checks 7. Fluids/Electrolytes/Nutrition:  Routine in and out's with follow-up chemistries poor intake increased creat   -continue IVF at noc ,   -eating well 8.Hypertension. Norvasc 10 mg daily, clonidine 0.2 mg daily, Toprol-XL 50 mg daily Vitals:   04/15/18 0349 04/15/18 0958  BP: 124/76 (!) 150/74  Pulse: 63   Resp: 18   Temp: 97.7 F (36.5 C)   SpO2: 100%   BP controlled 12/15 9. Dysphagia. Dysphagia #2 nectar thick liquids. Follow-up speech therapy 10. Diabetes mellitus with peripheral neuropathy. Hemoglobin A1c 7.3. Glucotrol 5 mg twice a day,Levemir 15 units twice a day. Check blood sugars before meals and at bedtime. Diabetic teaching CBG (last 3)  Recent Labs    04/14/18 1627 04/14/18 2157 04/15/18 0624  GLUCAP 175* 157* 95  improving control 12/15 levimir   21U BID,   glucotrol at home dose of 10mg     11. Hyperlipidemia. Lipitor 12.Suspect UTI. Urine study positive nitrites + bact .Begin empiric coverage-  Off abx 13.  Spasticity RUE and RLE --rom, tizanidine stopped due to sedation   LOS: 11 days A FACE TO FACE EVALUATION WAS PERFORMED  Ranelle Oyster 04/15/2018, 10:34 AM

## 2018-04-16 ENCOUNTER — Inpatient Hospital Stay (HOSPITAL_COMMUNITY): Payer: Commercial Indemnity | Admitting: Speech Pathology

## 2018-04-16 ENCOUNTER — Inpatient Hospital Stay (HOSPITAL_COMMUNITY): Payer: Commercial Indemnity

## 2018-04-16 DIAGNOSIS — I69391 Dysphagia following cerebral infarction: Secondary | ICD-10-CM

## 2018-04-16 DIAGNOSIS — R7309 Other abnormal glucose: Secondary | ICD-10-CM

## 2018-04-16 DIAGNOSIS — I69351 Hemiplegia and hemiparesis following cerebral infarction affecting right dominant side: Principal | ICD-10-CM

## 2018-04-16 DIAGNOSIS — R4701 Aphasia: Secondary | ICD-10-CM

## 2018-04-16 DIAGNOSIS — E139 Other specified diabetes mellitus without complications: Secondary | ICD-10-CM

## 2018-04-16 DIAGNOSIS — I639 Cerebral infarction, unspecified: Secondary | ICD-10-CM

## 2018-04-16 LAB — GLUCOSE, CAPILLARY
GLUCOSE-CAPILLARY: 216 mg/dL — AB (ref 70–99)
Glucose-Capillary: 248 mg/dL — ABNORMAL HIGH (ref 70–99)
Glucose-Capillary: 236 mg/dL — ABNORMAL HIGH (ref 70–99)
Glucose-Capillary: 140 mg/dL — ABNORMAL HIGH (ref 70–99)
Glucose-Capillary: 143 mg/dL — ABNORMAL HIGH (ref 70–99)
Glucose-Capillary: 157 mg/dL — ABNORMAL HIGH (ref 70–99)
Glucose-Capillary: 306 mg/dL — ABNORMAL HIGH (ref 70–99)

## 2018-04-16 NOTE — Progress Notes (Signed)
Occupational Therapy Session Note  Patient Details  Name: Douglas [name] MRN: [id number] Date of Birth: 06/28/1950  Today's Date: 04/16/2018 OT Individual Time: [id number] OT Individual Time Calculation (min): 75 min    Short Term Goals: Week 2:  OT Short Term Goal 1 (Week 2): Pt will complete basic transfer with +1 assist in order to decrease caregiver burden OT Short Term Goal 2 (Week 2): Pt will attend to R UE during functional task with max multi-modal cuing OT Short Term Goal 3 (Week 2): Pt will locate 1 item R of midline during functional task with mod cuing  Skilled Therapeutic Interventions/Progress Updates:    Pt resting in bed upon arrival.  OT intervention with focus on bed mobility, sitting balance, functional transfers, sit<>stand, standing balance, dressing tasks, task initiation, attention to R, following one step commands, activity tolerance, and safety awareness to increase independence with BADLs.  Pt required mod A for supine>sit EOB with HOB elevated and max multimodal cues for LUE placement to inhibit pushing tendency. Pt required tot A +2 for squat pivot transfer.  Pt's LUE initially placed on w/c arm rest to assist with transfer but repositioned on therapist's shoulder to eliminate pushing from pt.  Pt initiated donning shirt and pants but does not attend to R side and requires assistance to complete tasks.  Attempted to engage pt in grooming tasks but pt perseverates on washing face.  Shaving cream placed in pt's L hand but pt did not initiate placing on face.  [facility] pt unable to complete tasks.  Pt tot A for shaving task. Pt remained in TIS w/c with belt alarm activated and all needs within reach.   Therapy Documentation Precautions:  Precautions Precautions: Fall Precaution Comments: dense R hemi Restrictions Weight Bearing Restrictions: No Pain:  Pt with no s/s of pain   Therapy/Group: Individual Therapy  Rich BraveLanier, Ranae Casebier  Chappell 04/16/2018, 10:17 AM

## 2018-04-16 NOTE — Progress Notes (Signed)
Speech Language Pathology Daily Session Note  Patient Details  Name: Douglas [name] MRN: [id number] Date of Birth: 06/23/1950  Today's Date: 04/16/2018 SLP Individual Time: 0950-1030 SLP Individual Time Calculation (min): 40 min  Short Term Goals: Week 2: SLP Short Term Goal 1 (Week 2): Pt will consume therapeutic trials of thin liquids with minimal overt s/s of aspiration and min cues for use of swallowing precautions.  SLP Short Term Goal 2 (Week 2): Pt will consume dys 2 textures and nectar thick liquids with minimal overt s/s of aspiration and min cues for use of swallowing precautions.   SLP Short Term Goal 3 (Week 2): Pt will verbalize at the word level in >50% of opportunities with mod assist multimodal cues.   SLP Short Term Goal 4 (Week 2): Pt will follow 1 step commands in >75% of opportunities with mod assist multimodal cues.   SLP Short Term Goal 5 (Week 2): Pt will answer basic, immediately relevant yes/no questions for >50% accuracy with min assist multimodal cues.   SLP Short Term Goal 6 (Week 2): Pt will match object to word from a field of three for 75% accuracy with mod assist multimodal cues.    Skilled Therapeutic Interventions: Skilled treatment session focused on communication goals. SLP facilitated session by providing Max A multimodal cues for patient to identify functional items from a field of 2 with 40% accuracy. Patient also required Max A multimodal cues to vocalize on command in which he was able to do in less then 25% of opportunities. However, patient did demonstrate increased sustained attention to tasks with increased eye contact with clinician throughout session. Patient left upright in wheelchair with alarm on and all needs within reach. Continue with current plan of care.      Pain Pain Assessment Pain Scale: Faces Pain Score: 0-No pain Faces Pain Scale: No hurt  Therapy/Group: Individual Therapy  Douglas [name] 04/16/2018, 12:38 PM

## 2018-04-16 NOTE — Progress Notes (Signed)
Physical Therapy Session Note  Patient Details  Name: Douglas CordiaWilliam L Neuser Jr MRN: [id number] Date of Birth: [birth year]  Today's Date: 04/16/2018 PT Individual Time: 1345-1455 PT Individual Time Calculation (min): 70 min   Short Term Goals: Week 2:  PT Short Term Goal 1 (Week 2): Pt will transfer max assist x1 using LRAD PT Short Term Goal 2 (Week 2): Pt will initiate gait training PT Short Term Goal 3 (Week 2): Pt will self-propel manual w/c 50' w/ mod assist  PT Short Term Goal 4 (Week 2): Pt will maintain static sitting balance w/ supervision PT Short Term Goal 5 (Week 2): Pt will transfer sit<>stand w/ max assist x1  Skilled Therapeutic Interventions/Progress Updates:    Pt seated in TIS w/c upon PT arrival, agreeable to therapy tx and no evidence of pain, pt unable to report. Pt transported to dayroom. Session focused on standing tolerance and midline orientation within standing frame with strategies to minimize L UE/LE pushers syndrome. Pt used standing frame x 3 trials this session for 5-15 minute bouts. While in standing frame therapist provided manual facilitation and tactile cueing for upright trunk positioning, used mirror for visual feedback, second person holding L UE to minimize pushing. Pt performed one trial in standing frame with dynadisc under R LE to minimize LE pushing to the R and increase weightbearing through L LE. Pt required max assist throughout standing frame use in order to get into midline and maintain midline/upright posture. Attempted to have pt perform functional tasks with L UE while standing such as washing face with cloth and fixing hat, however pt quickly fights tasks and reverts to UE pushing. Pt transported back to room and transferred to bed with slideboard total assist +2. Pt transferred to supine with +2 assist and left with needs in reach and bed alarm set.   Therapy Documentation Precautions:  Precautions Precautions: Fall Precaution Comments: dense R  hemi Restrictions Weight Bearing Restrictions: No   Therapy/Group: Individual Therapy  Cresenciano GenreEmily van Schagen, PT, DPT 04/16/2018, 7:50 AM

## 2018-04-16 NOTE — Progress Notes (Signed)
[place] PHYSICAL MEDICINE & REHABILITATION PROGRESS NOTE   Subjective/Complaints: Patient seen sitting up in bed this morning.  No reported issues overnight.  ROS: limited due to cognition   Objective:   No results found. No results for input(s): WBC, HGB, HCT, PLT in the last 72 hours. No results for input(s): NA, K, CL, CO2, GLUCOSE, BUN, CREATININE, CALCIUM in the last 72 hours.  Intake/Output Summary (Last 24 hours) at 04/16/2018 1229 Last data filed at 04/16/2018 0810 Gross per 24 hour  Intake 3358.6 ml  Output -  Net 3358.6 ml     Physical Exam: Vital Signs Blood pressure (!) 152/89, pulse 75, temperature 97.8 F (36.6 C), resp. rate 17, height 5\' 10"  (1.778 m), weight 89.1 kg, SpO2 100 %.  Constitutional: No distress . Vital signs reviewed. HENT: Normocephalic.  Atraumatic. Eyes: EOMI. No discharge. Cardiovascular: RRR. No JVD. Respiratory: CTA Bilaterally. Normal effort. GI: BS +. Non-distended. Musc: No edema or tenderness in extremities. Neurologic:  Alert. Motor: Limited due to ability to participate Not moving right side Moving left side spontaneously Global aphasia Skin: No evidence of breakdown, no evidence of rash  Assessment/Plan: 1. Functional deficits secondary to Left MCA infarct R HP and Aphasia which require 3+ hours per day of interdisciplinary therapy in a comprehensive inpatient rehab setting.  Physiatrist is providing close team supervision and 24 hour management of active medical problems listed below.  Physiatrist and rehab team continue to assess barriers to discharge/monitor patient progress toward functional and medical goals  Care Tool:  Bathing  Bathing activity did not occur: Safety/medical concerns(night bath) Body parts bathed by patient: Front perineal area   Body parts bathed by helper: Right arm, Left arm, Abdomen, Chest, Front perineal area, Buttocks, Right upper leg, Left upper leg, Right lower leg, Left lower leg,  Face(pt refuses to help will not take face cloth)     Bathing assist Assist Level: 2 Helpers     Upper Body Dressing/Undressing Upper body dressing   What is the patient wearing?: Pull over shirt    Upper body assist Assist Level: Moderate Assistance - Patient 50 - 74%    Lower Body Dressing/Undressing Lower body dressing      What is the patient wearing?: Pants     Lower body assist Assist for lower body dressing: 2 Helpers     Toileting Toileting    Toileting assist Assist for toileting: Total Assistance - Patient < 25%     Transfers Chair/bed transfer  Transfers assist     Chair/bed transfer assist level: 2 Helpers     Locomotion Ambulation   Ambulation assist   Ambulation activity did not occur: Safety/medical concerns  Assist level: Dependent - Patient 0% Assistive device: Lite Gait Max distance: 50'   Walk 10 feet activity   Assist  Walk 10 feet activity did not occur: Safety/medical concerns  Assist level: Dependent - Patient 0% Assistive device: Lite Gait   Walk 50 feet activity   Assist Walk 50 feet with 2 turns activity did not occur: Safety/medical concerns  Assist level: Dependent - Patient 0% Assistive device: Lite Gait    Walk 150 feet activity   Assist Walk 150 feet activity did not occur: Safety/medical concerns         Walk 10 feet on uneven surface  activity   Assist Walk 10 feet on uneven surfaces activity did not occur: Safety/medical concerns         Wheelchair     Assist Will  patient use wheelchair at discharge?: Yes Type of Wheelchair: Manual Wheelchair activity did not occur: Safety/medical concerns  Wheelchair assist level: Total Assistance - Patient < 25% Max wheelchair distance: 100'    Wheelchair 50 feet with 2 turns activity    Assist    Wheelchair 50 feet with 2 turns activity did not occur: Safety/medical concerns   Assist Level: Total Assistance - Patient < 25%   Wheelchair  150 feet activity     Assist Wheelchair 150 feet activity did not occur: Safety/medical concerns   Assist Level: Total Assistance - Patient < 25%     Medical Problem List and Plan: 1.  Right side weakness with aphasia/dysphagia secondary to large left MCA infarction 03/30/18 as well as history of CVA 2013.aspirin and Plavix 3 months  Continue CIR 2.  DVT Prophylaxis/Anticoagulation: subcutaneous Lovenox. Monitor for any bleeding episodes 3. Pain Management:  Tylenol as needed 4. Mood: BuSpar 7.5 mg twice a day, Provide emotional support  -ritalin to improve arousal, increase to 10mg  5. Neuropsych: This patient is capable of making decisions on his own behalf. 6. Skin/Wound Care:  Routine skin checks 7. Fluids/Electrolytes/Nutrition:  Routine in and out's with follow-up chemistries poor intake increased creat   -continue IVF at noc 8.Hypertension. Norvasc 10 mg daily, clonidine 0.2 mg daily, Toprol-XL 50 mg daily Vitals:   04/16/18 0532 04/16/18 0807  BP: (!) 143/79 (!) 152/89  Pulse: 64 75  Resp: 17   Temp: 97.8 F (36.6 C)   SpO2: 100%    Labile on 12/16, will consider medication adjustments if persistently elevated. 9. Dysphagia. Dysphagia #2 nectar thick liquids. Follow-up speech therapy 10. Diabetes mellitus with peripheral neuropathy. Hemoglobin A1c 7.3. Glucotrol 5 mg twice a day,Levemir 15 units twice a day. Check blood sugars before meals and at bedtime. Diabetic teaching CBG (last 3)  Recent Labs    04/16/18 0641 04/16/18 0746 04/16/18 1126  GLUCAP 140* 143* 157*   levimir   21U BID,   glucotrol at home dose of 10mg      Labile on 12/16 11. Hyperlipidemia. Lipitor 12.Suspect UTI. Urine study positive nitrites + bact .Begin empiric coverage-  Off abx 13.  Spasticity RUE and RLE --rom, tizanidine stopped due to sedation   LOS: 12 days A FACE TO FACE EVALUATION WAS PERFORMED   Douglas [name]  04/16/2018, 12:29 PM

## 2018-04-17 ENCOUNTER — Inpatient Hospital Stay (HOSPITAL_COMMUNITY): Payer: Self-pay

## 2018-04-17 ENCOUNTER — Inpatient Hospital Stay (HOSPITAL_COMMUNITY): Payer: Commercial Indemnity | Admitting: Speech Pathology

## 2018-04-17 ENCOUNTER — Inpatient Hospital Stay (HOSPITAL_COMMUNITY): Payer: Commercial Indemnity

## 2018-04-17 LAB — GLUCOSE, CAPILLARY
GLUCOSE-CAPILLARY: 176 mg/dL — AB (ref 70–99)
GLUCOSE-CAPILLARY: 178 mg/dL — AB (ref 70–99)
Glucose-Capillary: 143 mg/dL — ABNORMAL HIGH (ref 70–99)
Glucose-Capillary: 163 mg/dL — ABNORMAL HIGH (ref 70–99)

## 2018-04-17 NOTE — Progress Notes (Signed)
[place] PHYSICAL MEDICINE & REHABILITATION PROGRESS NOTE   Subjective/Complaints: Patient seen laying in bed this morning.  No reported issues overnight.  ROS: limited due to cognition   Objective:   No results found. No results for input(s): WBC, HGB, HCT, PLT in the last 72 hours. No results for input(s): NA, K, CL, CO2, GLUCOSE, BUN, CREATININE, CALCIUM in the last 72 hours.  Intake/Output Summary (Last 24 hours) at 04/17/2018 0955 Last data filed at 04/17/2018 0825 Gross per 24 hour  Intake 1245 ml  Output 400 ml  Net 845 ml     Physical Exam: Vital Signs Blood pressure 132/73, pulse 62, temperature 98.4 F (36.9 C), temperature source Oral, resp. rate 18, height 5\' 10"  (1.778 m), weight 89.1 kg, SpO2 98 %.  Constitutional: No distress . Vital signs reviewed. HENT: Normocephalic.  Atraumatic. Eyes: EOMI. No discharge. Cardiovascular: RRR.  No JVD. Respiratory: CTA bilaterally. Normal effort. GI: BS +. Non-distended. Musc: No edema or tenderness in extremities. Neurologic:  Alert. Motor: Limited due to ability to participate Not moving right side, unchanged Moving left side spontaneously, unchanged Global aphasia Skin: No evidence of breakdown, no evidence of rash  Assessment/Plan: 1. Functional deficits secondary to Left MCA infarct R HP and Aphasia which require 3+ hours per day of interdisciplinary therapy in a comprehensive inpatient rehab setting.  Physiatrist is providing close team supervision and 24 hour management of active medical problems listed below.  Physiatrist and rehab team continue to assess barriers to discharge/monitor patient progress toward functional and medical goals  Care Tool:  Bathing  Bathing activity did not occur: Safety/medical concerns(night bath) Body parts bathed by patient: Front perineal area   Body parts bathed by helper: Right arm, Left arm, Chest, Abdomen, Front perineal area, Buttocks, Right upper leg, Left upper  leg, Right lower leg, Left lower leg, Face     Bathing assist Assist Level: 2 Helpers     Upper Body Dressing/Undressing Upper body dressing   What is the patient wearing?: Pull over shirt    Upper body assist Assist Level: Maximal Assistance - Patient 25 - 49%    Lower Body Dressing/Undressing Lower body dressing      What is the patient wearing?: Pants     Lower body assist Assist for lower body dressing: 2 Helpers     Toileting Toileting    Toileting assist Assist for toileting: Total Assistance - Patient < 25%     Transfers Chair/bed transfer  Transfers assist     Chair/bed transfer assist level: 2 Helpers     Locomotion Ambulation   Ambulation assist   Ambulation activity did not occur: Safety/medical concerns  Assist level: Dependent - Patient 0% Assistive device: Lite Gait Max distance: 50'   Walk 10 feet activity   Assist  Walk 10 feet activity did not occur: Safety/medical concerns  Assist level: Dependent - Patient 0% Assistive device: Lite Gait   Walk 50 feet activity   Assist Walk 50 feet with 2 turns activity did not occur: Safety/medical concerns  Assist level: Dependent - Patient 0% Assistive device: Lite Gait    Walk 150 feet activity   Assist Walk 150 feet activity did not occur: Safety/medical concerns         Walk 10 feet on uneven surface  activity   Assist Walk 10 feet on uneven surfaces activity did not occur: Safety/medical concerns         Wheelchair     Assist Will patient use wheelchair  at discharge?: Yes Type of Wheelchair: Manual Wheelchair activity did not occur: Safety/medical concerns  Wheelchair assist level: Total Assistance - Patient < 25% Max wheelchair distance: 100'    Wheelchair 50 feet with 2 turns activity    Assist    Wheelchair 50 feet with 2 turns activity did not occur: Safety/medical concerns   Assist Level: Total Assistance - Patient < 25%   Wheelchair 150 feet  activity     Assist Wheelchair 150 feet activity did not occur: Safety/medical concerns   Assist Level: Total Assistance - Patient < 25%     Medical Problem List and Plan: 1.  Right side weakness with aphasia/dysphagia secondary to large left MCA infarction 03/30/18 as well as history of CVA 2013.aspirin and Plavix 3 months  Continue CIR 2.  DVT Prophylaxis/Anticoagulation: subcutaneous Lovenox. Monitor for any bleeding episodes 3. Pain Management:  Tylenol as needed 4. Mood: BuSpar 7.5 mg twice a day, Provide emotional support  -ritalin to improve arousal, increased to 10mg  5. Neuropsych: This patient is capable of making decisions on his own behalf. 6. Skin/Wound Care:  Routine skin checks 7. Fluids/Electrolytes/Nutrition:  Routine in and out's with follow-up chemistries poor intake increased creat   -continue IVF at noc 8.Hypertension. Norvasc 10 mg daily, clonidine 0.2 mg daily, Toprol-XL 50 mg daily Vitals:   04/16/18 1928 04/17/18 0434  BP: (!) 144/112 132/73  Pulse: 84 62  Resp: 19 18  Temp: 98.4 F (36.9 C) 98.4 F (36.9 C)  SpO2: 100% 98%   Labile on 12/17 9. Dysphagia. Dysphagia #2 nectar thick liquids. Follow-up speech therapy 10. Diabetes mellitus with peripheral neuropathy. Hemoglobin A1c 7.3. Glucotrol 5 mg twice a day,Levemir 15 units twice a day. Check blood sugars before meals and at bedtime. Diabetic teaching CBG (last 3)  Recent Labs    04/16/18 1639 04/16/18 2108 04/17/18 0620  GLUCAP 306* 216* 143*   levimir   21U BID,   glucotrol at home dose of 10mg      Labile on 12/17, will likely require further adjustments tomorrow 11. Hyperlipidemia. Lipitor 12.Suspect UTI. Urine study positive nitrites + bact .Begin empiric coverage-  Off abx 13.  Spasticity RUE and RLE --rom, tizanidine stopped due to sedation   LOS: 13 days A FACE TO FACE EVALUATION WAS PERFORMED   Karis Jubanil  04/17/2018, 9:55 AM

## 2018-04-17 NOTE — Progress Notes (Signed)
Physical Therapy Session Note  Patient Details  Name: Douglas [name] MRN: [id number] Date of Birth: 12/20/1950  Today's Date: 04/17/2018 PT Individual Time: 1100-1158 PT Individual Time Calculation (min): 58 min   Short Term Goals: Week 2:  PT Short Term Goal 1 (Week 2): Pt will transfer max assist x1 using LRAD PT Short Term Goal 2 (Week 2): Pt will initiate gait training PT Short Term Goal 3 (Week 2): Pt will self-propel manual w/c 50' w/ mod assist  PT Short Term Goal 4 (Week 2): Pt will maintain static sitting balance w/ supervision PT Short Term Goal 5 (Week 2): Pt will transfer sit<>stand w/ max assist x1  Skilled Therapeutic Interventions/Progress Updates:    Pt seated in TIS w/c upon PT arrival, agreeable to therapy tx and no evidence of pain. Pt transported to gym. Pt performed stand pivot to mat with total assist +2. Pt seated on elevated mat for weightbearing through LEs working on L lateral leans to touch shoulder to wall with mirror for visual feedback, working on midline with therapist behind pt providing facilitation for upright posture and trunk extension. Pt performed x 2 sit<>stands within stedy this session with max assist +2. While standing therapist behind pt providing manual facilitation for upright posture, hip extension and holding L UE to prevent from grabbing stedy, pt worked on L lateral weightshifting in stedy to touch L shoulder to wall, mirror for visual feedback, pt maintained standing x 8 minutes and x 5 minutes with max assist for balance and midline. Pt seated on elevated stedy seat working on dynamic balance, midline and reaching to grasp cones with L UE and place behind him. Pt transferred to TIS and transported back to room, left seated with needs in reach and family present.   Therapy Documentation Precautions:  Precautions Precautions: Fall Precaution Comments: dense R hemi Restrictions Weight Bearing Restrictions: No   Therapy/Group: Individual  Therapy  Cresenciano GenreEmily van Schagen, PT, DPT 04/17/2018, 8:01 AM

## 2018-04-17 NOTE — Progress Notes (Signed)
Speech Language Pathology Daily Session Note  Patient Details  Name: Douglas [name] MRN: [id number] Date of Birth: [birth year]  Today's Date: 04/17/2018 SLP Individual Time: [id number] SLP Individual Time Calculation (min): 27 min  Short Term Goals: Week 2: SLP Short Term Goal 1 (Week 2): Pt will consume therapeutic trials of thin liquids with minimal overt s/s of aspiration and min cues for use of swallowing precautions.  SLP Short Term Goal 2 (Week 2): Pt will consume dys 2 textures and nectar thick liquids with minimal overt s/s of aspiration and min cues for use of swallowing precautions.   SLP Short Term Goal 3 (Week 2): Pt will verbalize at the word level in >50% of opportunities with mod assist multimodal cues.   SLP Short Term Goal 4 (Week 2): Pt will follow 1 step commands in >75% of opportunities with mod assist multimodal cues.   SLP Short Term Goal 5 (Week 2): Pt will answer basic, immediately relevant yes/no questions for >50% accuracy with min assist multimodal cues.   SLP Short Term Goal 6 (Week 2): Pt will match object to word from a field of three for 75% accuracy with mod assist multimodal cues.    Skilled Therapeutic Interventions:  Pt was seen for skilled ST targeting goals for communication and dysphagia.  SLP facilitated the session with trials of thin liquids following oral care.  Pt consumed teaspoons of thin liquids with no overt s/s of aspiration.  Trials of thin liquids via cup sips were limited but also did not elicit overt s/s of aspiration.  Pt would vocalize "yea" in response to therapist's questions; however, responses were inaccurate as they were perseverative despite max multimodal cues in attempts to elicit "no" responses.  Singing tasks also did not elicit any additional language today.  Pt was left in bed with bed alarm set and daughter at bedside.  Continue per current plan of care.    Pain Pain Assessment Pain Scale: 0-10 Pain Score: 0-No  pain  Therapy/Group: Individual Therapy  Aeriel Boulay, Melanee SpryNicole L 04/17/2018, 4:18 PM

## 2018-04-17 NOTE — Plan of Care (Signed)
  Problem: Consults Goal: RH STROKE PATIENT EDUCATION Description See Patient Education module for education specifics  Outcome: Not Progressing   Problem: RH BOWEL ELIMINATION Goal: RH STG MANAGE BOWEL WITH ASSISTANCE Description STG Manage Bowel with min Assistance.  Outcome: Not Progressing Goal: RH STG MANAGE BOWEL W/MEDICATION W/ASSISTANCE Description STG Manage Bowel with Medication with mod I Assistance.  Outcome: Not Progressing   Problem: RH BLADDER ELIMINATION Goal: RH STG MANAGE BLADDER WITH ASSISTANCE Description STG Manage Bladder With  Min Assistance  Outcome: Not Progressing   Problem: RH SAFETY Goal: RH STG ADHERE TO SAFETY PRECAUTIONS W/ASSISTANCE/DEVICE Description STG Adhere to Safety Precautions With cues/reminders supervision/Assistance/Device.  Outcome: Not Progressing   Problem: RH KNOWLEDGE DEFICIT Goal: RH STG INCREASE KNOWLEDGE OF DIABETES Description Daughter to be able to explain management of dm using medications na d dietary restrictions independently using handouts and resources. Dtr will be able to administer insulin independently  Outcome: Not Progressing Goal: RH STG INCREASE KNOWLEDGE OF HYPERTENSION Description Daughter to be able to explain management of HTN using medications na d dietary restrictions independently using handouts and resources  Outcome: Not Progressing Goal: RH STG INCREASE KNOWLEDGE OF DYSPHAGIA/FLUID INTAKE Description Dtr and pt will be able to demonstrate understanding of dysphagia diet restrictions and medication administration to avoid aspiration independently using handouts/resources  Outcome: Not Progressing Goal: RH STG INCREASE KNOWLEGDE OF HYPERLIPIDEMIA Description Daughter to be able to explain management of HLD using medications and dietary restrictions independently using handouts and resources  Outcome: Not Progressing Goal: RH STG INCREASE KNOWLEDGE OF STROKE PROPHYLAXIS Description Daughter to be  able to explain management of secondary stroke prevention using medications and dietary restrictions independently using handouts and resources  Outcome: Not Progressing

## 2018-04-17 NOTE — Progress Notes (Signed)
Occupational Therapy Session Note  Patient Details  Name: Douglas [name] MRN: [id number] Date of Birth: 18-Feb-1951  Today's Date: 04/17/2018 OT Individual Time: [id number] OT Individual Time Calculation (min): 75 min    Short Term Goals: Week 2:  OT Short Term Goal 1 (Week 2): Pt will complete basic transfer with +1 assist in order to decrease caregiver burden OT Short Term Goal 2 (Week 2): Pt will attend to R UE during functional task with max multi-modal cuing OT Short Term Goal 3 (Week 2): Pt will locate 1 item R of midline during functional task with mod cuing  Skilled Therapeutic Interventions/Progress Updates:    OT intervention with focus on dressing tasks at bed level, task initiation, bed mobility, sequencing, sitting balance, functional transfers, and activity tolerance to increase independence with BADLs.  Dressing tasks completed at bed level this morning.  Pt initiated threading pants but required assistance to complete task.  Pt initiated bridging with LLE and was able to pull pants over L hip. Pt required tot A for pulling pants over R hip.  Pt initiated donning shirt.  Pt does not attend to R side of body during dressing tasks and requires tot A when engaging RUE/RLE in functional tasks.  Pt sat EOB with max A and required tot A+2 for squat pivot transfer to w/c.  Pt transitioned to gym.  Attempted to engage pt in boxing task but pt unable to initiate task.  Pt can also catch small ball but unable to initate tossing ball back to therapist.  Pt remained in w/c with belt alarm activated and all needs within reach.  Therapy Documentation Precautions:  Precautions Precautions: Fall Precaution Comments: dense R hemi Restrictions Weight Bearing Restrictions: No   Pain:  Pt with no s/s of pain   Therapy/Group: Individual Therapy  Rich BraveLanier, Douglas [name] 04/17/2018, 12:37 PM

## 2018-04-17 NOTE — Progress Notes (Signed)
Speech Language Pathology Daily Session Note  Patient Details  Name: Douglas [name] MRN: [id number] Date of Birth: 1950-11-03  Today's Date: 04/17/2018 SLP Individual Time: 1300-1400 SLP Individual Time Calculation (min): 60 min  Short Term Goals: Week 2: SLP Short Term Goal 1 (Week 2): Pt will consume therapeutic trials of thin liquids with minimal overt s/s of aspiration and min cues for use of swallowing precautions.  SLP Short Term Goal 2 (Week 2): Pt will consume dys 2 textures and nectar thick liquids with minimal overt s/s of aspiration and min cues for use of swallowing precautions.   SLP Short Term Goal 3 (Week 2): Pt will verbalize at the word level in >50% of opportunities with mod assist multimodal cues.   SLP Short Term Goal 4 (Week 2): Pt will follow 1 step commands in >75% of opportunities with mod assist multimodal cues.   SLP Short Term Goal 5 (Week 2): Pt will answer basic, immediately relevant yes/no questions for >50% accuracy with min assist multimodal cues.   SLP Short Term Goal 6 (Week 2): Pt will match object to word from a field of three for 75% accuracy with mod assist multimodal cues.    Skilled Therapeutic Interventions: Skilled treatment session focused on auditory comprehension and expressive communication goals. Patient followed basic commands within a novel task with 50% accuracy and Max A multimodal cues. Patient also scanned to his right visual field throughout task with Max A verbal and tactile cues. Patient vocalized in 25% of opportunities despite Max A multimodal cues with focus on breath support. Patient's daughter present and educated on progress. Patient left upright in wheelchair with all needs within reach. Continue with current plan of care.      Pain No/Denies Pain   Therapy/Group: Individual Therapy  Jezabel Lecker 04/17/2018, 3:37 PM

## 2018-04-18 ENCOUNTER — Inpatient Hospital Stay (HOSPITAL_COMMUNITY): Payer: Commercial Indemnity | Admitting: *Deleted

## 2018-04-18 ENCOUNTER — Inpatient Hospital Stay (HOSPITAL_COMMUNITY): Payer: Self-pay

## 2018-04-18 ENCOUNTER — Inpatient Hospital Stay (HOSPITAL_COMMUNITY): Payer: Commercial Indemnity

## 2018-04-18 ENCOUNTER — Inpatient Hospital Stay (HOSPITAL_COMMUNITY): Payer: Commercial Indemnity | Admitting: Speech Pathology

## 2018-04-18 LAB — GLUCOSE, CAPILLARY
GLUCOSE-CAPILLARY: 233 mg/dL — AB (ref 70–99)
Glucose-Capillary: 147 mg/dL — ABNORMAL HIGH (ref 70–99)
Glucose-Capillary: 233 mg/dL — ABNORMAL HIGH (ref 70–99)
Glucose-Capillary: 144 mg/dL — ABNORMAL HIGH (ref 70–99)

## 2018-04-18 NOTE — Progress Notes (Signed)
Speech Language Pathology Daily Session Note  Patient Details  Name: Douglas [name] Handyside Jr MRN: [id number] Date of Birth: [birth year]  Today's Date: 04/18/2018 SLP Individual Time: 1300-1400 SLP Individual Time Calculation (min): 60 min  Short Term Goals: Week 2: SLP Short Term Goal 1 (Week 2): Pt will consume therapeutic trials of thin liquids with minimal overt s/s of aspiration and min cues for use of swallowing precautions.  SLP Short Term Goal 2 (Week 2): Pt will consume dys 2 textures and nectar thick liquids with minimal overt s/s of aspiration and min cues for use of swallowing precautions.   SLP Short Term Goal 3 (Week 2): Pt will verbalize at the word level in >50% of opportunities with mod assist multimodal cues.   SLP Short Term Goal 4 (Week 2): Pt will follow 1 step commands in >75% of opportunities with mod assist multimodal cues.   SLP Short Term Goal 5 (Week 2): Pt will answer basic, immediately relevant yes/no questions for >50% accuracy with min assist multimodal cues.   SLP Short Term Goal 6 (Week 2): Pt will match object to word from a field of three for 75% accuracy with mod assist multimodal cues.    Skilled Therapeutic Interventions: Skilled treatment session focused on cognitive-linguistic goals. Upon arrival, patient attempting to undo his chair alarm and indicating with gestures he needed to use the bathroom. Urinal was set in place but patient did not void and continued to gesture to the bathroom. Patient transferred to the bed via the [facility] and was continent of bowel. SLP also facilitated session by providing Min A verbal cues to identify coins from a field of 4 and Max A multimodal cues to make the correct amount of money from 2 coins. Patient able to utilize enough breath support to blow a whistle but unable to to generate breath support for vocalizations/verbalizations at the word level. Patient able to vocalize in 20% of opportunities. Patient appeared lethargic and  less engaged this session. Patient left upright in bed with all needs within reach and alarm on. Continue with current plan of care.        Pain Pain Assessment Faces Pain Scale: No hurt  Therapy/Group: Individual Therapy  Zacharey Jensen 04/18/2018, 2:04 PM

## 2018-04-18 NOTE — Progress Notes (Signed)
Physical Therapy Session Note  Patient Details  Name: Douglas [name] MRN: [id number] Date of Birth: 1950-06-03  Today's Date: 04/18/2018 PT Individual Time: 1000-1100 PT Individual Time Calculation (min): 60 min   Short Term Goals: Week 2:  PT Short Term Goal 1 (Week 2): Pt will transfer max assist x1 using LRAD PT Short Term Goal 2 (Week 2): Pt will initiate gait training PT Short Term Goal 3 (Week 2): Pt will self-propel manual w/c 50' w/ mod assist  PT Short Term Goal 4 (Week 2): Pt will maintain static sitting balance w/ supervision PT Short Term Goal 5 (Week 2): Pt will transfer sit<>stand w/ max assist x1  Skilled Therapeutic Interventions/Progress Updates:   Pt seated in TIS w/c upon PT arrival, agreeable to therapy tx and denies pain. Pt transported to the gym. Pt performed squat pivot from TIS w/c<>mat with max assist +2, pt noticed to be incontinent and transported back to room. Pt performed squat pivot to bed with total assist +2. Pt transferred to supine with max assist +2. Pt able to initiate bridging while therapist doffed pants. Pt performed rolling with max assist to doff brief. Pt given wash cloth and able to perform peri care. Pt also noted to be incontinent of bowel, total assist for hygiene. Pt performed rolling with max assist to don clean brief. Donned clean pants and shoes. Pt transferred to sitting EOB with max assist. Pt donned clean shirt with mod assist to loop R UE through. Pt able to initiate scooting to the Left towards w/c for transfer, continued to require max +2 for transfer to w/c. Pt transported to gym and used standing frame x 8 minutes with mirror for visual feedback, therapist holding pts L UE in the air to minimize pushing and pt able to maintain midline the entire time with supervision-min assist for positioning. Pt left in TIS at end of session with chair alarm set in care of daughter.   Therapy Documentation Precautions:  Precautions Precautions:  Fall Precaution Comments: dense R hemi Restrictions Weight Bearing Restrictions: No   Therapy/Group: Individual Therapy  Cresenciano GenreEmily van [name], PT, DPT 04/18/2018, 7:54 AM

## 2018-04-18 NOTE — Progress Notes (Signed)
Speech Language Pathology Daily Session Note  Patient Details  Name: Douglas [name] MRN: [id number] Date of Birth: 22-May-1950  Today's Date: 04/18/2018 SLP Individual Time: 1505-1530 SLP Individual Time Calculation (min): 25 min  Short Term Goals: Week 2: SLP Short Term Goal 1 (Week 2): Pt will consume therapeutic trials of thin liquids with minimal overt s/s of aspiration and min cues for use of swallowing precautions.  SLP Short Term Goal 2 (Week 2): Pt will consume dys 2 textures and nectar thick liquids with minimal overt s/s of aspiration and min cues for use of swallowing precautions.   SLP Short Term Goal 3 (Week 2): Pt will verbalize at the word level in >50% of opportunities with mod assist multimodal cues.   SLP Short Term Goal 4 (Week 2): Pt will follow 1 step commands in >75% of opportunities with mod assist multimodal cues.   SLP Short Term Goal 5 (Week 2): Pt will answer basic, immediately relevant yes/no questions for >50% accuracy with min assist multimodal cues.   SLP Short Term Goal 6 (Week 2): Pt will match object to word from a field of three for 75% accuracy with mod assist multimodal cues.    Skilled Therapeutic Interventions:Skilled ST services focused on swallow and cognitive skills. Pt was holding remote/call bell upon entering, however given max A multimodal cues was unable to express want/needs. SLP facilitated matching object to word in a field of 2 and 3, however pt demonstrated less than 50% accuracy. SLP facilitated response to yes/no questions pertaining to immediate environment and self, demonstrated less than 50% accuracy. SLP provided NTL via cup, pt demonstrated no overt s/s aspiration and no anterior spillage or pooling noted. Pt was left in room with call bell within reach and bed alarm set. SLP reccomends to continue skilled services.     Pain Pain Assessment Pain Score: 0-No pain Faces Pain Scale: No hurt  Therapy/Group: Individual  Therapy  [name]  [facility] 04/18/2018, 3:50 PM

## 2018-04-18 NOTE — Progress Notes (Signed)
Social Work Patient ID: Douglas [name], male   DOB: July 15, 1950, 67 y.o.   MRN: [id number] Met with pt and daughter to discuss team conference progression toward his goals and the progress he has made since last week. Discussion will be how to get pt back to [facility] will begin to look at options for this. Daughter still looking for handicapped apartment has choice of two. Discussed home health, NHP and other alternatives. Unsure if it will be doable to get pt back to [facility] directly form [facility].

## 2018-04-18 NOTE — Patient Care Conference (Signed)
Inpatient RehabilitationTeam Conference and Plan of Care Update Date: 04/18/2018   Time: 11:10 AM    Patient Name: Douglas [name]      Medical Record Number: [id number]  Date of Birth: Oct 24, 1950 Sex: Male         Room/Bed: 4W22C/4W22C-01 Payor Info: Payor: MEDICARE / Plan: MEDICARE PART A AND B / Product Type: *No Product type* /    Admitting Diagnosis: CVA  Admit Date/Time:  04/04/2018  3:09 PM Admission Comments: No comment available   Primary Diagnosis:  <principal problem not specified> Principal Problem: <principal problem not specified>  Patient Active Problem List   Diagnosis Date Noted  . 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 Labile blood glucose   . Global aphasia   . Cerebral edema (HCC) 04/04/2018  . Renal insufficiency 04/04/2018  . 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 Acute lower UTI   . Type 2 diabetes mellitus with peripheral neuropathy (HCC)   . Acute embolic stroke (HCC)   . Diabetes 1.5, managed as type 2 (HCC)   . Essential hypertension   . Dyslipidemia   . History of CVA with residual deficit   . Dysphagia, post-stroke     Expected Discharge Date: Expected Discharge Date: 05/02/18  Team Members Present: Physician leading conference: Dr. Claudette LawsAndrew Kirsteins Social Worker Present: Dossie DerBecky Abad Manard, LCSW Nurse Present: Tennis MustLuz Rosero, RN PT Present: Woodfin GanjaEmily Van Shagen, PT OT Present: Ardis Rowanom [name], COTA SLP Present: Feliberto Gottronourtney Payne, SLP PPS Coordinator present : Fae PippinMelissa Bowie     Current Status/Progress Goal Weekly Team Focus  Medical   Aphasia unchanged, improved oral intake  Stabilized blood sugar, reduce fall risk  Ritalin trial not very helpful, maintain hydration off IV fluids   Bowel/Bladder   Incontinent of B/B lbm 04/17/18, Continent at times with urinal  pt will be continent of B/B normal bowel function  Time roileting QS and prn, Laxative as schedule and prn   Swallow/Nutrition/  Hydration   Dys. 3 textures with nectar-thick liquids, Min-Mod A verbal cues for use of strategies  Supervision  use of swallowing strategies    ADL's   bed mobility-max A; UB dressing-mod A; LB dressing-max A bed level/+2 sit<>stand; max verbal cues to attend to R; max verbal cues for task initiation  mod A overall; min A toilet transfers  attention, activity tolerance, sitting balance, sit<>stand, functional transfers, BADL retraining, education   Mobility   max assist bed mobility, total assist transfers, total +2 for standing  min assist  attention, midline, activity tolerance, R NMR, standing, pushers syndrome treatment   Communication   Total A   Min A   vocalizing on commands, producing single phonemes, multimodal communication    Safety/Cognition/ Behavioral Observations  Max A   Min A  attention, problem solving, awareness    Pain   Denies pain   pt will be free of pain   Assess QS AND PRN    Skin   Skin intact, assess qs AND PRN address any new issues          Rehab Goals Patient on target to meet rehab goals: Yes *See Care Plan and progress notes for long and short-term goals.     Barriers to Discharge  Current Status/Progress Possible Resolutions Date Resolved   Physician    Inaccessible home environment;Medical stability  [place]  Progressing towards goals very slowly,  Question  Zenaida Niece transportation to [place]      Nursing                  PT  Decreased caregiver support;Inaccessible home environment;Home environment access/layout;Medical stability                 OT                  SLP                SW                Discharge Planning/Teaching Needs:  Daughter here to observe in therapies and provide support. Pt making slow gains in therpaies. Daughter working on finding a handicapped apartment in [facility]      Team Discussion:  Progressing slowly towards his goals, starting to iniatiation some task. Making more  sounds. Pushing is less and starting to scoot now. Still max to total plus 2 for safety. Drinking more MD to discharge IV's at night. Nursing working on continence. More aware of his deficits. Question how he will get back to [facility].  Revisions to Treatment Plan:  DC 1/1    Continued Need for Acute Rehabilitation Level of Care: The patient requires daily medical management by a physician with specialized training in physical medicine and rehabilitation for the following conditions: Daily direction of a multidisciplinary physical rehabilitation program to ensure safe treatment while eliciting the highest outcome that is of practical value to the patient.: Yes Daily medical management of patient stability for increased activity during participation in an intensive rehabilitation regime.: Yes Daily analysis of laboratory values and/or radiology reports with any subsequent need for medication adjustment of medical intervention for : Neurological problems   I attest that I was present, lead the team conference, and concur with the assessment and plan of the team.   Lucy Chris 04/18/2018, 1:03 PM

## 2018-04-18 NOTE — Progress Notes (Signed)
Occupational Therapy Session Note  Patient Details  Name: Douglas [name] MRN: [id number] Date of Birth: 03/30/1951  Today's Date: 04/18/2018 OT Individual Time: 0800-0900 OT Individual Time Calculation (min): 60 min    Short Term Goals: Week 2:  OT Short Term Goal 1 (Week 2): Pt will complete basic transfer with +1 assist in order to decrease caregiver burden OT Short Term Goal 2 (Week 2): Pt will attend to R UE during functional task with max multi-modal cuing OT Short Term Goal 3 (Week 2): Pt will locate 1 item R of midline during functional task with mod cuing  Skilled Therapeutic Interventions/Progress Updates:    OT intervention with focus on bed mobility, sitting balance, functional transfers, BADL retraining, task initiation, sequencing, attention to R, attention to task, and activity tolerance to increase independence with BADLs.  Pt initiates removing incontinence brief, donning pants, and donning shirt.  When presented with wash cloth pt washes face perseveratively.  When wash cloth placed on other body parts, pt will was that body part.  Duriing dressing tasks pt requires tot A for attending to R side of body.  Pt did apply shaving cream to face when placed in his L hand today but did not attend to or initiate shaving task.  Pt remained in TIS w/c with all needs within reach and belt alarm activated.   Therapy Documentation Precautions:  Precautions Precautions: Fall Precaution Comments: dense R hemi Restrictions Weight Bearing Restrictions: No   Pain: Pain Assessment Faces Pain Scale: No hurt   Therapy/Group: Individual Therapy  Rich BraveLanier, Romey Mathieson Chappell 04/18/2018, 3:04 PM

## 2018-04-18 NOTE — Progress Notes (Signed)
[place] PHYSICAL MEDICINE & REHABILITATION PROGRESS NOTE   Subjective/Complaints: Drank 2 containers of liquid and started on a third ,no cough  ROS: limited due to cognition   Objective:   No results found. No results for input(s): WBC, HGB, HCT, PLT in the last 72 hours. No results for input(s): NA, K, CL, CO2, GLUCOSE, BUN, CREATININE, CALCIUM in the last 72 hours.  Intake/Output Summary (Last 24 hours) at 04/18/2018 0935 Last data filed at 04/18/2018 0900 Gross per 24 hour  Intake 600 ml  Output 200 ml  Net 400 ml     Physical Exam: Vital Signs Blood pressure 133/77, pulse 63, temperature 98.2 F (36.8 C), temperature source Oral, resp. rate 17, height 5\' 10"  (1.778 m), weight 89.1 kg, SpO2 100 %.  Constitutional: No distress . Vital signs reviewed. HENT: Normocephalic.  Atraumatic. Eyes: EOMI. No discharge. Cardiovascular: RRR.  No JVD. Respiratory: CTA bilaterally. Normal effort. GI: BS +. Non-distended. Musc: No edema or tenderness in extremities. Neurologic:  Alert. Motor:trace right biceps o/w    0/5  , 3- RLE  Hip/knee ext synergy                Moving left side spontaneously, unchanged Global aphasia Skin: No evidence of breakdown, no evidence of rash  Assessment/Plan: 1. Functional deficits secondary to Left MCA infarct R HP and Aphasia which require 3+ hours per day of interdisciplinary therapy in a comprehensive inpatient rehab setting.  Physiatrist is providing close team supervision and 24 hour management of active medical problems listed below.  Physiatrist and rehab team continue to assess barriers to discharge/monitor patient progress toward functional and medical goals  Care Tool:  Bathing  Bathing activity did not occur: Safety/medical concerns(night bath) Body parts bathed by patient: Front perineal area   Body parts bathed by helper: Right arm, Left arm, Chest, Abdomen, Front perineal area, Buttocks, Right upper leg, Left upper leg,  Right lower leg, Left lower leg, Face     Bathing assist Assist Level: 2 Helpers     Upper Body Dressing/Undressing Upper body dressing   What is the patient wearing?: Pull over shirt    Upper body assist Assist Level: Moderate Assistance - Patient 50 - 74%    Lower Body Dressing/Undressing Lower body dressing      What is the patient wearing?: Pants, Incontinence brief     Lower body assist Assist for lower body dressing: Maximal Assistance - Patient 25 - 49%(bed level)     Toileting Toileting    Toileting assist Assist for toileting: Total Assistance - Patient < 25%     Transfers Chair/bed transfer  Transfers assist     Chair/bed transfer assist level: 2 Helpers     Locomotion Ambulation   Ambulation assist   Ambulation activity did not occur: Safety/medical concerns  Assist level: Dependent - Patient 0% Assistive device: Lite Gait Max distance: 50'   Walk 10 feet activity   Assist  Walk 10 feet activity did not occur: Safety/medical concerns  Assist level: Dependent - Patient 0% Assistive device: Lite Gait   Walk 50 feet activity   Assist Walk 50 feet with 2 turns activity did not occur: Safety/medical concerns  Assist level: Dependent - Patient 0% Assistive device: Lite Gait    Walk 150 feet activity   Assist Walk 150 feet activity did not occur: Safety/medical concerns         Walk 10 feet on uneven surface  activity   Assist Walk 10 feet  on uneven surfaces activity did not occur: Safety/medical concerns         Wheelchair     Assist Will patient use wheelchair at discharge?: Yes Type of Wheelchair: Manual Wheelchair activity did not occur: Safety/medical concerns  Wheelchair assist level: Total Assistance - Patient < 25% Max wheelchair distance: 100'    Wheelchair 50 feet with 2 turns activity    Assist    Wheelchair 50 feet with 2 turns activity did not occur: Safety/medical concerns   Assist Level:  Total Assistance - Patient < 25%   Wheelchair 150 feet activity     Assist Wheelchair 150 feet activity did not occur: Safety/medical concerns   Assist Level: Total Assistance - Patient < 25%     Medical Problem List and Plan: 1.  Right side weakness with aphasia/dysphagia secondary to large left MCA infarction 03/30/18 as well as history of CVA 2013.aspirin and Plavix 3 months  Continue CIR 2.  DVT Prophylaxis/Anticoagulation: subcutaneous Lovenox. Monitor for any bleeding episodes 3. Pain Management:  Tylenol as needed 4. Mood: BuSpar 7.5 mg twice a day, Provide emotional support  -ritalin to improve arousal, increased to 10mg  5. Neuropsych: This patient is capable of making decisions on his own behalf. 6. Skin/Wound Care:  Routine skin checks 7. Fluids/Electrolytes/Nutrition:  Routine in and out's with follow-up chemistries poor intake increased creat   -fluid intake po improved will d/c IVF at noc 8.Hypertension. Norvasc 10 mg daily, clonidine 0.2 mg daily, Toprol-XL 50 mg daily Vitals:   04/17/18 2048 04/18/18 0519  BP: (!) 158/83 133/77  Pulse: 87 63  Resp:  17  Temp:  98.2 F (36.8 C)  SpO2: 100% 100%   Labile on 12/18 9. Dysphagia. Dysphagia #2 nectar thick liquids. Follow-up speech therapy 10. Diabetes mellitus with peripheral neuropathy. Hemoglobin A1c 7.3. Glucotrol 5 mg twice a day,Levemir 15 units twice a day. Check blood sugars before meals and at bedtime. Diabetic teaching CBG (last 3)  Recent Labs    04/17/18 1640 04/17/18 2132 04/18/18 0621  GLUCAP 176* 178* 147*   levimir   21U BID,   glucotrol at home dose of 10mg      improving , will monitor 11. Hyperlipidemia. Lipitor 12.Suspect UTI. Urine study positive nitrites + bact .Begin empiric coverage-  Off abx 13.  Spasticity RUE and RLE --ROM with PT, OT, tizanidine stopped due to sedation   LOS: 14 days A FACE TO FACE EVALUATION WAS PERFORMED  Erick Colace 04/18/2018, 9:35 AM

## 2018-04-19 ENCOUNTER — Inpatient Hospital Stay (HOSPITAL_COMMUNITY): Payer: Commercial Indemnity | Admitting: Physical Therapy

## 2018-04-19 ENCOUNTER — Inpatient Hospital Stay (HOSPITAL_COMMUNITY): Payer: Commercial Indemnity | Admitting: Speech Pathology

## 2018-04-19 LAB — GLUCOSE, CAPILLARY
GLUCOSE-CAPILLARY: 105 mg/dL — AB (ref 70–99)
GLUCOSE-CAPILLARY: 217 mg/dL — AB (ref 70–99)
Glucose-Capillary: 182 mg/dL — ABNORMAL HIGH (ref 70–99)
Glucose-Capillary: 225 mg/dL — ABNORMAL HIGH (ref 70–99)

## 2018-04-19 MED ORDER — GLIPIZIDE 5 MG PO TABS
10.0000 mg | ORAL_TABLET | Freq: Two times a day (BID) | ORAL | Status: DC
  Administered 2018-04-19 – 2018-05-04 (×31): 10 mg via ORAL
  Filled 2018-04-19 (×32): qty 2

## 2018-04-19 NOTE — Progress Notes (Signed)
Physical Therapy Weekly Progress Note  Patient Details  Name: Douglas [name] MRN: [id number] Date of Birth: 09/30/1950  Beginning of progress report period: April 12, 2018 End of progress report period: April 19, 2018  Today's Date: 04/19/2018 PT Individual Time: 1100-1200 PT Individual Time Calculation (min): 60 min   Patient has met 2 of 5 short term goals. Pt is making limited progress towards LTGs. However, he has demonstrated improved command following w/ simple and automatic tasks, attention/awareness, and midline orientation over the last week. This has allowed pt to more meaningfully participate in therapy. He continues to require +2 assist w/ transfers and sit<>stands 2/2 pushing tendencies, RLE tone, and impaired motor planning.   Patient continues to demonstrate the following deficits muscle weakness and muscle joint tightness, decreased cardiorespiratoy endurance, impaired timing and sequencing, abnormal tone, unbalanced muscle activation, motor apraxia, decreased coordination and decreased motor planning, decreased midline orientation and right side neglect, decreased initiation, decreased attention, decreased awareness, decreased problem solving, decreased safety awareness, decreased memory and delayed processing and decreased sitting balance, decreased standing balance, decreased postural control, hemiplegia and decreased balance strategies and therefore will continue to benefit from skilled PT intervention to increase functional independence with mobility.  Patient not progressing toward long term goals.  See goal revision..  Plan of care revisions: goals downgraded to mod assist overall 2/2 lack of progress, household gait goal discontinued.  PT Short Term Goals Week 2:  PT Short Term Goal 1 (Week 2): Pt will transfer max assist x1 using LRAD PT Short Term Goal 1 - Progress (Week 2): Progressing toward goal PT Short Term Goal 2 (Week 2): Pt will initiate gait  training PT Short Term Goal 2 - Progress (Week 2): Met PT Short Term Goal 3 (Week 2): Pt will self-propel manual w/c 50' w/ mod assist  PT Short Term Goal 3 - Progress (Week 2): Progressing toward goal PT Short Term Goal 4 (Week 2): Pt will maintain static sitting balance w/ supervision PT Short Term Goal 4 - Progress (Week 2): Met PT Short Term Goal 5 (Week 2): Pt will transfer sit<>stand w/ max assist x1 PT Short Term Goal 5 - Progress (Week 2): Progressing toward goal Week 3:  PT Short Term Goal 1 (Week 3): Pt will maintain midline during functional mobility, 50% of the time w/ min cues  PT Short Term Goal 2 (Week 3): Pt will transfer bed<>chair w/ max assist x1 PT Short Term Goal 3 (Week 3): Pt will perform sit<>stand w/ max assist x1 PT Short Term Goal 4 (Week 3): Pt will follow simple 1-step commands during functional mobility 75% of the time   Skilled Therapeutic Interventions/Progress Updates:   Pt in w/c and appeared agreeable to therapy, waving and interacting w/ therapist with his facial expressions this session. No evidence of pain. Session focused on L weight shifting, postural control, and midline orientation. Transferred to edge of mat against wall on L side, via stedy +2 assist for sit<>stand in stedy. Worked on L reaching to facilitate forward and L lateral weight shifting, both from seated and WB in stedy. Visual and tactile cues for initiation, attention to task, motor planning, and weight shifting. Mirror provided for visual feedback. Max cues fading to min w/ repetition. Pt able to lean forward 50% of time and <25% of time when attempting to facilitate L trunk weight shifting. Towel roll added under R hip w/ improved midline trunk posture at rest. Placed ball in between L shoulder and wall  to facilitate increased L pelvis weight shifting, pt able to keep ball from falling in 75% of trials for 20-30 sec at a time w/ almost constant verbal cues. Returned to room via w/c, ended  session reclined in TIS, all needs in reach.   Therapy Documentation Precautions:  Precautions Precautions: Fall Precaution Comments: dense R hemi Restrictions Weight Bearing Restrictions: No  Therapy/Group: Individual Therapy  Larue Drawdy Clent Demark 04/19/2018, 12:26 PM

## 2018-04-19 NOTE — Progress Notes (Signed)
Occupational Therapy Session Note  Patient Details  Name: Douglas [name] MRN: [id number] Date of Birth: 02/20/1951  Today's Date: 04/19/2018 OT Individual Time: 1000-1100 OT Individual Time Calculation (min): 60 min    Short Term Goals: Week 2:  OT Short Term Goal 1 (Week 2): Pt will complete basic transfer with +1 assist in order to decrease caregiver burden OT Short Term Goal 2 (Week 2): Pt will attend to R UE during functional task with max multi-modal cuing OT Short Term Goal 3 (Week 2): Pt will locate 1 item R of midline during functional task with mod cuing  Skilled Therapeutic Interventions/Progress Updates:    OT intervention with focus on BADL retraining, bed mobility, sitting balance, functional transfers, following one step commands, task initiation, sequencing, attention to task, and activity tolerance to increase independence with BADLs. Pt incontinent of bladder and required tot A for hygiene.  Pt requires max A for rolling in bed to facilitate donning brief and pulling pants over hips.  Pt requires max a for supine>sit EOB in preparation for squat pivot transfer to w/c.  Pt initiated threading pants and donning shirt but did not attend to his R and required assistance to complete task.  Pt required tot A for squat pivot transfer to w/c.  Pt remained in w/c when PT arrived for session.   Therapy Documentation Precautions:  Precautions Precautions: Fall Precaution Comments: dense R hemi Restrictions Weight Bearing Restrictions: No Pain:  Pt with no s/s of pain   Therapy/Group: Individual Therapy  Rich BraveLanier, Bertie Mcconathy Chappell 04/19/2018, 2:54 PM

## 2018-04-19 NOTE — Progress Notes (Signed)
Speech Language Pathology Daily Session Note  Patient Details  Name: Douglas CordiaWilliam L Martian Jr MRN: [id number] Date of Birth: 02-16-1951  Today's Date: 04/19/2018 SLP Individual Time: 1430-1500 SLP Individual Time Calculation (min): 30 min  Short Term Goals: Week 3: SLP Short Term Goal 1 (Week 3): Pt will consume therapeutic trials of thin liquids with minimal overt s/s of aspiration and min cues for use of swallowing precautions.  SLP Short Term Goal 2 (Week 3): Patient will consume current diet with minimal overt s/s of aspiration with Mod A verbal cues for use of swallowing compensatory strategies.  SLP Short Term Goal 3 (Week 3): Patient will match a functional item to an object drawing with 50% accuracy and Mod A multimodal cues.  SLP Short Term Goal 4 (Week 3): Pt will follow 1 step commands in >75% of opportunities with min assist multimodal cues.   SLP Short Term Goal 5 (Week 3): Pt will vocalize at the word level in >50% of opportunities with mod assist multimodal cues.   SLP Short Term Goal 6 (Week 3): Pt will answer basic, immediately relevant yes/no questions for >50% accuracy with min assist multimodal cues.    Skilled Therapeutic Interventions:  Skilled treatment session targeted cognition, communication and dysphagia. SLP facilitated session by providing Total A for any attempt at communication. While SLP sang religious song, pt raised his hand but not attempts to voice. SLP further facilitated session by providing skilled observation of pt consuming thin liquids via cup sips. Pt with delayed cough suggestive of delayed swallow initiation. Pt was left upright in wheelchair, lap alarm on and all needs within reach. Continue per current plan of care.        Pain Pain Assessment Pain Scale: Faces Faces Pain Scale: (unable)  Therapy/Group: Individual Therapy  Shambhavi Salley 04/19/2018, 4:19 PM

## 2018-04-19 NOTE — Progress Notes (Signed)
Speech Language Pathology Weekly Progress and Session Note  Patient Details  Name: Douglas [name] MRN: [id number] Date of Birth: 1950/06/15  Beginning of progress report period: April 12, 2018 End of progress report period: April 19, 2018  Today's Date: 04/19/2018 SLP Individual Time: 1430-1500 SLP Individual Time Calculation (min): 30 min  Short Term Goals: Week 2: SLP Short Term Goal 1 (Week 2): Pt will consume therapeutic trials of thin liquids with minimal overt s/s of aspiration and min cues for use of swallowing precautions.  SLP Short Term Goal 1 - Progress (Week 2): Not met SLP Short Term Goal 2 (Week 2): Pt will consume dys 2 textures and nectar thick liquids with minimal overt s/s of aspiration and min cues for use of swallowing precautions.   SLP Short Term Goal 2 - Progress (Week 2): Not met SLP Short Term Goal 3 (Week 2): Pt will verbalize at the word level in >50% of opportunities with mod assist multimodal cues.   SLP Short Term Goal 3 - Progress (Week 2): Not met SLP Short Term Goal 4 (Week 2): Pt will follow 1 step commands in >75% of opportunities with mod assist multimodal cues.   SLP Short Term Goal 4 - Progress (Week 2): Met SLP Short Term Goal 5 (Week 2): Pt will answer basic, immediately relevant yes/no questions for >50% accuracy with min assist multimodal cues.   SLP Short Term Goal 5 - Progress (Week 2): Not met SLP Short Term Goal 6 (Week 2): Pt will match object to word from a field of three for 75% accuracy with mod assist multimodal cues.   SLP Short Term Goal 6 - Progress (Week 2): Not met    New Short Term Goals: Week 3: SLP Short Term Goal 1 (Week 3): Pt will consume therapeutic trials of thin liquids with minimal overt s/s of aspiration and min cues for use of swallowing precautions.  SLP Short Term Goal 2 (Week 3): Patient will consume current diet with minimal overt s/s of aspiration with Mod A verbal cues for use of swallowing compensatory  strategies.  SLP Short Term Goal 3 (Week 3): Patient will match a functional item to an object drawing with 50% accuracy and Mod A multimodal cues.  SLP Short Term Goal 4 (Week 3): Pt will follow 1 step commands in >75% of opportunities with min assist multimodal cues.   SLP Short Term Goal 5 (Week 3): Pt will vocalize at the word level in >50% of opportunities with mod assist multimodal cues.   SLP Short Term Goal 6 (Week 3): Pt will answer basic, immediately relevant yes/no questions for >50% accuracy with min assist multimodal cues.    Weekly Progress Updates: Patient has made minimal gains and has met 1 of 6 STGs this reporting period. Currently, patient is consuming Dys. 2 textures with nectar-thick liquids and requires Max A verbal cues for use of swallowing compensatory strategies. Patient remains essentially nonverbal and vocalizes on command in less than 25% of opportunities. Patient also continues to demonstrate moderate-severe impairments in respective ability with Mod-Max A multimodal cues needed to follow commands and answer yes/no questions accurately. Moderate-severe deficits also continue to be noted in sustained attention, visual scanning to the right, problem solving and ability to self-monitor and correct motor perseveration. Patient and family education ongoing. Patient would benefit from continued skilled SLP intervention in order to maximize his cognitive and swallowing function and overall functional independence prior to discharge.      Intensity:  Minumum of 1-2 x/day, 30 to 90 minutes Frequency: 3 to 5 out of 7 days Duration/Length of Stay: 05/02/18 Treatment/Interventions: Cognitive remediation/compensation;Dysphagia/aspiration precaution training;Cueing hierarchy;Environmental controls;Internal/external aids;Multimodal communication approach;Patient/family education;Speech/Language facilitation;Functional tasks;Therapeutic Activities   Daily Session  Skilled Therapeutic  Interventions: Skilled treatment session focused on dysphagia and cognitive goals. SLP facilitated session by providing skilled observation with breakfast meal of Dys. 2 textures with nectar-thick liqiuds. Patient demonstrated 2 overt and large coughing episodes with solid textures due to a full oral cavity from impulsivity. Despite Max A multimodal cues, patient would not slow down or utilize a liquid wash, therefore, SLP had to remove the patient's plate until patient took several sips of nectar-thick liquids to clear his oral cavity. No overt s/s of aspiration observed with nectar-thick liquids. Recommend patient continue current diet. SLP also facilitated session by providing Max A multimodal cues during voiding. Patient indicated he needed to void and grabbed his penis out of his brief in attempt to stop the flow of urine. Patient then attempted to utilize the urinal but continued to void in the air and on himself as he removed his hand from his penis. Patient was total A for hygiene and changing of gown. Patient left upright in bed with alarm on and all needs within reach. Continue with current plan of care.      Pain No/Denies Pain   Therapy/Group: Individual Therapy  Lukasz Rogus 04/19/2018, 4:19 PM

## 2018-04-19 NOTE — Progress Notes (Signed)
[facility] PHYSICAL MEDICINE & REHABILITATION PROGRESS NOTE   Subjective/Complaints:  fair fluid intake on I/O but does not appear to be completely recorded  ROS: limited due to cognition   Objective:   No results found. No results for input(s): WBC, HGB, HCT, PLT in the last 72 hours. No results for input(s): NA, K, CL, CO2, GLUCOSE, BUN, CREATININE, CALCIUM in the last 72 hours.  Intake/Output Summary (Last 24 hours) at 04/19/2018 0842 Last data filed at 04/18/2018 1800 Gross per 24 hour  Intake 560 ml  Output -  Net 560 ml     Physical Exam: Vital Signs Blood pressure 126/78, pulse 63, temperature 97.7 F (36.5 C), resp. rate 17, height 5\' 10"  (1.778 m), weight 89.1 kg, SpO2 100 %.  Constitutional: No distress . Vital signs reviewed. HENT: Normocephalic.  Atraumatic. Eyes: EOMI. No discharge. Cardiovascular: RRR.  No JVD. Respiratory: CTA bilaterally. Normal effort. GI: BS +. Non-distended. Musc: No edema or tenderness in extremities. Neurologic:  Alert. Motor:trace right biceps o/w    0/5  , 3- RLE  Hip/knee ext synergy                Moving left side spontaneously, unchanged Global aphasia Skin: No evidence of breakdown, no evidence of rash  Assessment/Plan: 1. Functional deficits secondary to Left MCA infarct R HP and Aphasia which require 3+ hours per day of interdisciplinary therapy in a comprehensive inpatient rehab setting.  Physiatrist is providing close team supervision and 24 hour management of active medical problems listed below.  Physiatrist and rehab team continue to assess barriers to discharge/monitor patient progress toward functional and medical goals  Care Tool:  Bathing  Bathing activity did not occur: Safety/medical concerns(night bath) Body parts bathed by patient: Front perineal area   Body parts bathed by helper: Right arm, Left arm, Chest, Abdomen, Front perineal area, Buttocks, Right upper leg, Left upper leg, Right lower leg, Left  lower leg, Face     Bathing assist Assist Level: 2 Helpers     Upper Body Dressing/Undressing Upper body dressing   What is the patient wearing?: Pull over shirt    Upper body assist Assist Level: Moderate Assistance - Patient 50 - 74%    Lower Body Dressing/Undressing Lower body dressing      What is the patient wearing?: Pants, Incontinence brief     Lower body assist Assist for lower body dressing: Maximal Assistance - Patient 25 - 49%(bed level)     Toileting Toileting    Toileting assist Assist for toileting: Total Assistance - Patient < 25%     Transfers Chair/bed transfer  Transfers assist     Chair/bed transfer assist level: 2 Helpers     Locomotion Ambulation   Ambulation assist   Ambulation activity did not occur: Safety/medical concerns  Assist level: Dependent - Patient 0% Assistive device: Lite Gait Max distance: 50'   Walk 10 feet activity   Assist  Walk 10 feet activity did not occur: Safety/medical concerns  Assist level: Dependent - Patient 0% Assistive device: Lite Gait   Walk 50 feet activity   Assist Walk 50 feet with 2 turns activity did not occur: Safety/medical concerns  Assist level: Dependent - Patient 0% Assistive device: Lite Gait    Walk 150 feet activity   Assist Walk 150 feet activity did not occur: Safety/medical concerns         Walk 10 feet on uneven surface  activity   Assist Walk 10 feet on uneven  surfaces activity did not occur: Safety/medical concerns         Wheelchair     Assist Will patient use wheelchair at discharge?: Yes Type of Wheelchair: Manual Wheelchair activity did not occur: Safety/medical concerns  Wheelchair assist level: Total Assistance - Patient < 25% Max wheelchair distance: 100'    Wheelchair 50 feet with 2 turns activity    Assist    Wheelchair 50 feet with 2 turns activity did not occur: Safety/medical concerns   Assist Level: Total Assistance -  Patient < 25%   Wheelchair 150 feet activity     Assist Wheelchair 150 feet activity did not occur: Safety/medical concerns   Assist Level: Total Assistance - Patient < 25%     Medical Problem List and Plan: 1.  Right side weakness with aphasia/dysphagia secondary to large left MCA infarction 03/30/18 as well as history of CVA 2013.aspirin and Plavix 3 months  Continue CIR PT, OT, SLP- rec SNF [facility] 2.  DVT Prophylaxis/Anticoagulation: subcutaneous Lovenox. Monitor for any bleeding episodes 3. Pain Management:  Tylenol as needed 4. Mood: BuSpar 7.5 mg twice a day, Provide emotional support  -ritalin to improve arousal, increased to 10mg  5. Neuropsych: This patient is capable of making decisions on his own behalf. 6. Skin/Wound Care:  Routine skin checks 7. Fluids/Electrolytes/Nutrition:  Routine in and out's with follow-up chemistries poor intake increased creat   -fluid intake po improved off IVF at noc, BMET in am 8.Hypertension. Norvasc 10 mg daily, clonidine 0.2 mg daily, Toprol-XL 50 mg daily Vitals:   04/18/18 2021 04/19/18 0503  BP: (!) 146/86 126/78  Pulse: 80 63  Resp: 18 17  Temp: 98.4 F (36.9 C) 97.7 F (36.5 C)  SpO2: 100% 100%   Labile on 12/19 9. Dysphagia. Dysphagia #2 nectar thick liquids. Follow-up speech therapy 10. Diabetes mellitus with peripheral neuropathy. Hemoglobin A1c 7.3. Glucotrol 5 mg twice a day,Levemir 15 units twice a day. Check blood sugars before meals and at bedtime. Diabetic teaching CBG (last 3)  Recent Labs    04/18/18 1718 04/18/18 2022 04/19/18 0635  GLUCAP 233* 144* 105*   levimir   21U BID,   glucotrol at home dose of 10mg      improving , will monitor 11. Hyperlipidemia. Lipitor 12.Suspect UTI. Urine study positive nitrites + bact .Begin empiric coverage-  Off abx 13.  Spasticity RUE and RLE --ROM with PT, OT, tizanidine stopped due to sedation, flexor withdrawal RLE   LOS: 15 days A FACE TO FACE  EVALUATION WAS PERFORMED  Erick Colacendrew E Starlee Corralejo 04/19/2018, 8:42 AM

## 2018-04-20 ENCOUNTER — Inpatient Hospital Stay (HOSPITAL_COMMUNITY): Payer: Commercial Indemnity | Admitting: Physical Therapy

## 2018-04-20 ENCOUNTER — Inpatient Hospital Stay (HOSPITAL_COMMUNITY): Payer: Commercial Indemnity | Admitting: *Deleted

## 2018-04-20 ENCOUNTER — Inpatient Hospital Stay (HOSPITAL_COMMUNITY): Payer: Commercial Indemnity | Admitting: Speech Pathology

## 2018-04-20 ENCOUNTER — Inpatient Hospital Stay (HOSPITAL_COMMUNITY): Payer: Commercial Indemnity

## 2018-04-20 LAB — GLUCOSE, CAPILLARY
Glucose-Capillary: 157 mg/dL — ABNORMAL HIGH (ref 70–99)
Glucose-Capillary: 217 mg/dL — ABNORMAL HIGH (ref 70–99)
Glucose-Capillary: 244 mg/dL — ABNORMAL HIGH (ref 70–99)
Glucose-Capillary: 190 mg/dL — ABNORMAL HIGH (ref 70–99)

## 2018-04-20 MED ORDER — INSULIN DETEMIR 100 UNIT/ML ~~LOC~~ SOLN
23.0000 [IU] | Freq: Two times a day (BID) | SUBCUTANEOUS | Status: DC
  Administered 2018-04-20 – 2018-04-25 (×10): 23 [IU] via SUBCUTANEOUS
  Administered 2018-04-25: 22 [IU] via SUBCUTANEOUS
  Administered 2018-04-26 – 2018-05-03 (×15): 23 [IU] via SUBCUTANEOUS
  Filled 2018-04-20 (×28): qty 0.23

## 2018-04-20 NOTE — Progress Notes (Signed)
Occupational Therapy Session Note  Patient Details  Name: Douglas [name] MRN: [id number] Date of Birth: Oct 08, 1950  Today's Date: 04/20/2018 OT Individual Time: 1530-1556 OT Individual Time Calculation (min): 26 min    Short Term Goals: Week 1:  OT Short Term Goal 1 (Week 1): Pt will complete basic transfer with +1 assist in order to decrease caregiver burden OT Short Term Goal 1 - Progress (Week 1): Progressing toward goal OT Short Term Goal 2 (Week 1): Pt will attend to R UE during functional task with max multi-modal cuing OT Short Term Goal 2 - Progress (Week 1): Progressing toward goal OT Short Term Goal 3 (Week 1): Pt will locate 1 item R of midline during functional task with mod cuing OT Short Term Goal 3 - Progress (Week 1): Progressing toward goal  Skilled Therapeutic Interventions/Progress Updates:    1:1. pt seated on bed pan with NT present. +2 A provided for rolling with max VC for reaching LUE to R bed rail to facilitate rolling for hygiene/clothing management. Pt able to initate dressing LLE into pants. Pt rolls B as stated above to change chuck pad. Exited session with pt seated in bed, call light in reach, exit alarm on and UE supported on pillow for proper positioning.   Therapy Documentation Precautions:  Precautions Precautions: Fall Precaution Comments: dense R hemi Restrictions Weight Bearing Restrictions: No General:   Vital Signs: Therapy Vitals Temp: 98.1 F (36.7 C) Pulse Rate: 84 Resp: 18 BP: 130/79 Patient Position (if appropriate): Lying Oxygen Therapy SpO2: 100 % O2 Device: Room Air Pain:   ADL:   Vision   Perception    Praxis   Exercises:   Other Treatments:     Therapy/Group: Individual Therapy  Shon HaleStephanie M Trilby Way 04/20/2018, 4:06 PM

## 2018-04-20 NOTE — Progress Notes (Signed)
[facility] PHYSICAL MEDICINE & REHABILITATION PROGRESS NOTE   Subjective/Complaints:  Patient remains severely aphasic.  He very much wanted to eat today and was upset when he was taken out of the room for his breakfast.  Had a conversation yesterday with patient's daughter about transporting the patient to [place].  We discussed that due to his incontinence and mobility issues it would be very difficult for her to transport him in a regular car.  We also discussed skilled nursing facility placement for 3 to 4 weeks to allow for further recovery prior to attempting transport down to [place] which is a 13-hour drive.  ROS: limited due to cognition   Objective:   No results found. No results for input(s): WBC, HGB, HCT, PLT in the last 72 hours. No results for input(s): NA, K, CL, CO2, GLUCOSE, BUN, CREATININE, CALCIUM in the last 72 hours.  Intake/Output Summary (Last 24 hours) at 04/20/2018 1244 Last data filed at 04/20/2018 1213 Gross per 24 hour  Intake 480 ml  Output 225 ml  Net 255 ml     Physical Exam: Vital Signs Blood pressure 114/71, pulse 66, temperature 97.7 F (36.5 C), temperature source Oral, resp. rate 14, height 5\' 10"  (1.778 m), weight 89.1 kg, SpO2 100 %.  Constitutional: No distress . Vital signs reviewed. HENT: Normocephalic.  Atraumatic. Eyes: EOMI. No discharge. Cardiovascular: RRR.  No JVD. Respiratory: CTA bilaterally. Normal effort. GI: BS +. Non-distended. Musc: No edema or tenderness in extremities. Neurologic:  Alert. Motor:trace right biceps o/w    0/5  , 3- RLE  Hip/knee ext synergy                Moving left side spontaneously, unchanged Global aphasia Skin: No evidence of breakdown, no evidence of rash  Assessment/Plan: 1. Functional deficits secondary to Left MCA infarct R HP and Aphasia which require 3+ hours per day of interdisciplinary therapy in a comprehensive inpatient rehab setting.  Physiatrist is providing close team  supervision and 24 hour management of active medical problems listed below.  Physiatrist and rehab team continue to assess barriers to discharge/monitor patient progress toward functional and medical goals  Care Tool:  Bathing  Bathing activity did not occur: Refused(Charge nurse notified) Body parts bathed by patient: Front perineal area   Body parts bathed by helper: Right arm, Left arm, Chest, Abdomen, Front perineal area, Buttocks, Right upper leg, Left upper leg, Right lower leg, Left lower leg, Face     Bathing assist Assist Level: 2 Helpers     Upper Body Dressing/Undressing Upper body dressing   What is the patient wearing?: Pull over shirt    Upper body assist Assist Level: Moderate Assistance - Patient 50 - 74%    Lower Body Dressing/Undressing Lower body dressing      What is the patient wearing?: Pants, Incontinence brief     Lower body assist Assist for lower body dressing: Maximal Assistance - Patient 25 - 49%(bed level)     Toileting Toileting    Toileting assist Assist for toileting: Total Assistance - Patient < 25%     Transfers Chair/bed transfer  Transfers assist     Chair/bed transfer assist level: Dependent - mechanical lift     Locomotion Ambulation   Ambulation assist   Ambulation activity did not occur: Safety/medical concerns  Assist level: Dependent - Patient 0% Assistive device: Lite Gait Max distance: 50'   Walk 10 feet activity   Assist  Walk 10 feet activity did not occur:  Safety/medical concerns  Assist level: Dependent - Patient 0% Assistive device: Lite Gait   Walk 50 feet activity   Assist Walk 50 feet with 2 turns activity did not occur: Safety/medical concerns  Assist level: Dependent - Patient 0% Assistive device: Lite Gait    Walk 150 feet activity   Assist Walk 150 feet activity did not occur: Safety/medical concerns         Walk 10 feet on uneven surface  activity   Assist Walk 10 feet on  uneven surfaces activity did not occur: Safety/medical concerns         Wheelchair     Assist Will patient use wheelchair at discharge?: Yes Type of Wheelchair: Manual Wheelchair activity did not occur: Safety/medical concerns  Wheelchair assist level: Total Assistance - Patient < 25% Max wheelchair distance: 100'    Wheelchair 50 feet with 2 turns activity    Assist    Wheelchair 50 feet with 2 turns activity did not occur: Safety/medical concerns   Assist Level: Total Assistance - Patient < 25%   Wheelchair 150 feet activity     Assist Wheelchair 150 feet activity did not occur: Safety/medical concerns   Assist Level: Total Assistance - Patient < 25%     Medical Problem List and Plan: 1.  Right side weakness with aphasia/dysphagia secondary to large left MCA infarction 03/30/18 as well as history of CVA 2013.aspirin and Plavix 3 months  Continue CIR PT, OT, SLP- rec SNF [facility] 2.  DVT Prophylaxis/Anticoagulation: subcutaneous Lovenox. Monitor for any bleeding episodes 3. Pain Management:  Tylenol as needed 4. Mood: BuSpar 7.5 mg twice a day, Provide emotional support  -ritalin to improve arousal, increased to 10mg  5. Neuropsych: This patient is capable of making decisions on his own behalf. 6. Skin/Wound Care:  Routine skin checks 7. Fluids/Electrolytes/Nutrition:   -Good fluid and caloric intake 8.Hypertension. Norvasc 10 mg daily, clonidine 0.2 mg daily, Toprol-XL 50 mg daily Vitals:   04/19/18 2132 04/20/18 0536  BP: (!) 152/90 114/71  Pulse:  66  Resp:  14  Temp:  97.7 F (36.5 C)  SpO2:  100%   Labile on 12/19, controlled on 04/20/2018 9. Dysphagia. Dysphagia #2 nectar thick liquids. Follow-up speech therapy 10. Diabetes mellitus with peripheral neuropathy. Hemoglobin A1c 7.3. Glucotrol 5 mg twice a day,Levemir 15 units twice a day. Check blood sugars before meals and at bedtime. Diabetic teaching CBG (last 3)  Recent  Labs    04/19/18 2053 04/20/18 0705 04/20/18 1145  GLUCAP 217* 157* 217*   levimir   21U BID,   glucotrol at home dose of 10mg      Will increase Levemir, patient is eating more 11. Hyperlipidemia. Lipitor 12.Suspect UTI. Urine study positive nitrites + bact .Begin empiric coverage-  Off abx 13.  Spasticity RUE and RLE --ROM with PT, OT, tizanidine stopped due to sedation, flexor withdrawal RLE, he may be a good candidate for Botox but would likely have to have this done as an outpatient   LOS: 16 days A FACE TO FACE EVALUATION WAS PERFORMED  Erick Colacendrew E Kirsteins 04/20/2018, 12:44 PM

## 2018-04-20 NOTE — Plan of Care (Signed)
  Problem: Consults Goal: RH STROKE PATIENT EDUCATION Description See Patient Education module for education specifics  Outcome: Progressing   Problem: RH SAFETY Goal: RH STG ADHERE TO SAFETY PRECAUTIONS W/ASSISTANCE/DEVICE Description STG Adhere to Safety Precautions With cues/reminders supervision/Assistance/Device.  Outcome: Progressing   Problem: RH KNOWLEDGE DEFICIT Goal: RH STG INCREASE KNOWLEDGE OF DIABETES Description Daughter to be able to explain management of dm using medications na d dietary restrictions independently using handouts and resources. Dtr will be able to administer insulin independently  Outcome: Not Progressing Goal: RH STG INCREASE KNOWLEDGE OF HYPERTENSION Description Daughter to be able to explain management of HTN using medications na d dietary restrictions independently using handouts and resources  Outcome: Not Progressing Goal: RH STG INCREASE KNOWLEDGE OF DYSPHAGIA/FLUID INTAKE Description Dtr and pt will be able to demonstrate understanding of dysphagia diet restrictions and medication administration to avoid aspiration independently using handouts/resources  Outcome: Not Progressing Goal: RH STG INCREASE KNOWLEDGE OF STROKE PROPHYLAXIS Description Daughter to be able to explain management of secondary stroke prevention using medications and dietary restrictions independently using handouts and resources  Outcome: Not Progressing

## 2018-04-20 NOTE — Progress Notes (Signed)
Occupational Therapy Weekly Progress Note  Patient Details  Name: Douglas [name] MRN: [id number] Date of Birth: 10-29-1950  Beginning of progress report period: April 12, 2018 End of progress report period: April 20, 2018  Patient has met 0 of 4 short term goals.  Pt has made minimal progress with bed mobility, sitting balance, task initiation, and attention to R, although there has been no functional gains with BADLs. Pt initiates threading pants on LLE but does not attend to RLE when threaded into pants.  Pt initiates donning shirt but does not attend to his RUE during task.  Pt requires max multimodal cues to attend to objects on his meal tray.  Pt continues to require tot A+2 for squat pivot transfers.  Patient continues to demonstrate the following deficits: muscle weakness and muscle paralysis, decreased cardiorespiratoy endurance, impaired timing and sequencing, abnormal tone, unbalanced muscle activation, motor apraxia, decreased coordination and decreased motor planning, decreased visual perceptual skills, decreased midline orientation, decreased attention to right, right side neglect, decreased motor planning and ideational apraxia, decreased initiation, decreased attention, decreased awareness, decreased problem solving, decreased safety awareness, decreased memory and delayed processing and decreased sitting balance, decreased standing balance, decreased postural control, hemiplegia and decreased balance strategies and therefore will continue to benefit from skilled OT intervention to enhance overall performance with BADL.  Patient progressing toward long term goals..  Continue plan of care.  OT Short Term Goals Week 2:  OT Short Term Goal 1 (Week 2): Pt will complete basic transfer with +1 assist in order to decrease caregiver burden OT Short Term Goal 1 - Progress (Week 2): Progressing toward goal OT Short Term Goal 2 (Week 2): Pt will attend to R UE during functional task  with max multi-modal cuing OT Short Term Goal 2 - Progress (Week 2): Progressing toward goal OT Short Term Goal 3 (Week 2): Pt will locate 1 item R of midline during functional task with mod cuing OT Short Term Goal 3 - Progress (Week 2): Progressing toward goal Week 3:  OT Short Term Goal 1 (Week 3): Pt will complete basic transfer with +1 assist in order to decrease caregiver burden OT Short Term Goal 2 (Week 3): Pt will attend to R UE during functional task with max multi-modal cuing OT Short Term Goal 3 (Week 3): Pt will locate 1 item R of midline during functional task with mod cuing  Precautions:  Precautions Precautions: Fall Precaution Comments: dense R hemi Restrictions Weight Bearing Restrictions: No   [name] 04/20/2018, 12:43 PM

## 2018-04-20 NOTE — Progress Notes (Signed)
Occupational Therapy Session Note  Patient Details  Name: Douglas [name] MRN: [id number] Date of Birth: Sep 15, 1950  Today's Date: 04/20/2018 OT Individual Time: 0800-0850 OT Individual Time Calculation (min): 50 min    Short Term Goals: Week 2:  OT Short Term Goal 1 (Week 2): Pt will complete basic transfer with +1 assist in order to decrease caregiver burden OT Short Term Goal 2 (Week 2): Pt will attend to R UE during functional task with max multi-modal cuing OT Short Term Goal 3 (Week 2): Pt will locate 1 item R of midline during functional task with mod cuing  Skilled Therapeutic Interventions/Progress Updates:    OT intervention with focus on bed mobility, sitting balance, functional transfers, initiating dressing tasks, self feeding, and attention to R.  Pt indicating unknown need upon entry in room, pointing and repeating "yes." Pt refused use of urinal but engaged in assisting with dressing at bed level.  Pt required max A for squat pivot transfer to w/c.  Pt noticed breakfast tray in room and began gesturing that he wanted to eat.  Pt engaged in self feeding with focus on portion management and attention to R to locate fluids.  Pt completed self feeding with supervision.  Pt remained seated in recliner for SLP session.   Therapy Documentation Precautions:  Precautions Precautions: Fall Precaution Comments: dense R hemi Restrictions Weight Bearing Restrictions: No   Pain: Pain Assessment Pain Scale: Faces Pain Score: 0-No pain Faces Pain Scale: No hurt   Therapy/Group: Individual Therapy  Rich BraveLanier, Yocelyn Brocious Chappell 04/20/2018, 12:29 PM

## 2018-04-20 NOTE — Progress Notes (Signed)
Speech Language Pathology Daily Session Note  Patient Details  Name: Douglas [name] MRN: [id number] Date of Birth: August 20, 1950  Today's Date: 04/20/2018 SLP Individual Time: 0850-0930 SLP Individual Time Calculation (min): 40 min  Short Term Goals: Week 3: SLP Short Term Goal 1 (Week 3): Pt will consume therapeutic trials of thin liquids with minimal overt s/s of aspiration and min cues for use of swallowing precautions.  SLP Short Term Goal 2 (Week 3): Patient will consume current diet with minimal overt s/s of aspiration with Mod A verbal cues for use of swallowing compensatory strategies.  SLP Short Term Goal 3 (Week 3): Patient will match a functional item to an object drawing with 50% accuracy and Mod A multimodal cues.  SLP Short Term Goal 4 (Week 3): Pt will follow 1 step commands in >75% of opportunities with min assist multimodal cues.   SLP Short Term Goal 5 (Week 3): Pt will vocalize at the word level in >50% of opportunities with mod assist multimodal cues.   SLP Short Term Goal 6 (Week 3): Pt will answer basic, immediately relevant yes/no questions for >50% accuracy with min assist multimodal cues.    Skilled Therapeutic Interventions:  Skilled treatment session focused on cognition goals. SLP facilitated session by providing Max A multimodal cues to place numbers in sequential order on large calendar. Despite Total A multimodal cues, pt was not able to turn over numbered cards to attempt game of War. During MD assessment, pt with no volitional attempt to follow directions or gesture any responses. Pt was left upright in tilt-in-space wheelchair with lap alarm in place and all needs within reach. Continue per current plan of care.      Pain Pain Assessment Pain Scale: Faces Pain Score: 0-No pain Faces Pain Scale: No hurt  Therapy/Group: Individual Therapy  Douglas [name] 04/20/2018, 10:11 AM

## 2018-04-20 NOTE — Progress Notes (Signed)
Physical Therapy Session Note  Patient Details  Name: Douglas [name] MRN: [id number] Date of Birth: Dec 21, 1950  Today's Date: 04/20/2018 PT Individual Time: 1415-1520 PT Individual Time Calculation (min): 65 min   Short Term Goals: Week 3:  PT Short Term Goal 1 (Week 3): Pt will maintain midline during functional mobility, 50% of the time w/ min cues  PT Short Term Goal 2 (Week 3): Pt will transfer bed<>chair w/ max assist x1 PT Short Term Goal 3 (Week 3): Pt will perform sit<>stand w/ max assist x1 PT Short Term Goal 4 (Week 3): Pt will follow simple 1-step commands during functional mobility 75% of the time   Skilled Therapeutic Interventions/Progress Updates:   Pt in TIS and agreeable to therapy, no evidence or c/o pain this session. Worked on gait training in Lite gait. Required +3 helpers to don sling, sit<>stand "3-musketeers" style. Ambulated 25' in Lite gait, w/c follow for safety and another helper steering w/c. Therapist providing total assist for RLE placement 2/2 adductor tone. Pt unable to reach full R knee extension in single leg stance despite max manual assist. Max manual assist also needed for L lateral weight shifting during single leg stance. Minimal R hip flexor initiation during swing limb advancement, however pt initiating movement. Pt noticed to be urinating down pant leg and onto floor. Returned to seated in w/c and to room for pericare/hygiene. Total +2 slide board transfer to EOB, max manual and verbal cues for forward lean and technique. Max assist +1 for R/L rolling, total assist for pericare. Pt incontinent of bowel and bladder. Pt actively having BM during pericare and bed pan was placed while in supine for pt to finish voiding. Ended session on bed pan, all needs in reach. Next therapist arriving in 10 min and agreeable to assist pt off bed pan.   Therapy Documentation Precautions:  Precautions Precautions: Fall Precaution Comments: dense R  hemi Restrictions Weight Bearing Restrictions: No  Therapy/Group: Individual Therapy  Francheska Villeda Melton KrebsK Zoraida Havrilla 04/20/2018, 3:40 PM

## 2018-04-21 ENCOUNTER — Inpatient Hospital Stay (HOSPITAL_COMMUNITY): Payer: Commercial Indemnity | Admitting: Physical Therapy

## 2018-04-21 LAB — GLUCOSE, CAPILLARY
Glucose-Capillary: 164 mg/dL — ABNORMAL HIGH (ref 70–99)
Glucose-Capillary: 219 mg/dL — ABNORMAL HIGH (ref 70–99)
Glucose-Capillary: 203 mg/dL — ABNORMAL HIGH (ref 70–99)
Glucose-Capillary: 119 mg/dL — ABNORMAL HIGH (ref 70–99)

## 2018-04-21 NOTE — Progress Notes (Addendum)
Douglas CordiaWilliam L Lage [name] is a 67 y.o. male [birth year] [id number]  Subjective: Aphasic. No new problems.  Objective: Vital signs in last 24 hours: Temp:  [98.1 F (36.7 C)-98.8 F (37.1 C)] 98.5 F (36.9 C) (12/21 0643) Pulse Rate:  [70-84] 70 (12/21 0643) Resp:  [14-18] 14 (12/21 0643) BP: (127-156)/(72-89) 127/73 (12/21 1451) SpO2:  [100 %] 100 % (12/21 0643) Weight change:  Last BM Date: 04/20/18  Intake/Output from previous day: 12/20 0701 - 12/21 0700 In: 360 [P.O.:360] Out: -  Last cbgs: CBG (last 3)  Recent Labs    04/20/18 2155 04/21/18 0647 04/21/18 1156  GLUCAP 190* 164* 219*     Physical Exam General: No apparent distress.  The patient is eating breakfast and seems to have a good appetite. HEENT: not dry Lungs: Normal effort. Lungs clear to auscultation, no crackles or wheezes. Cardiovascular: Regular rate and rhythm, no edema Abdomen: S/NT/ND; BS(+) Musculoskeletal:  unchanged Neurological: No new neurological deficits.  He is aphasic Wounds: N/A    Skin: clear  Mental state: Alert, cooperative    Lab Results: BMET    Component Value Date/Time   NA 137 04/11/2018 0659   K 4.0 04/11/2018 0659   CL 101 04/11/2018 0659   CO2 26 04/11/2018 0659   GLUCOSE 154 (H) 04/11/2018 0659   BUN 12 04/11/2018 0659   CREATININE 1.14 04/11/2018 0659   CALCIUM 9.3 04/11/2018 0659   GFRNONAA >60 04/11/2018 0659   GFRAA >60 04/11/2018 0659   CBC    Component Value Date/Time   WBC 6.4 04/08/2018 0618   RBC 4.40 04/08/2018 0618   HGB 12.8 (L) 04/08/2018 0618   HCT 38.3 (L) 04/08/2018 0618   PLT 237 04/08/2018 0618   MCV 87.0 04/08/2018 0618   MCH 29.1 04/08/2018 0618   MCHC 33.4 04/08/2018 0618   RDW 12.0 04/08/2018 0618   LYMPHSABS 1.3 04/08/2018 0618   MONOABS 0.6 04/08/2018 0618   EOSABS 0.2 04/08/2018 0618   BASOSABS 0.0 04/08/2018 0618    Studies/Results: No results found.  Medications: I have reviewed the patient's current  medications.  Assessment/Plan:   1.  Large left MCA infarction.  Right-sided weakness.  Continue with CIR 2.  DVT prophylaxis with Lovenox 3.  Pain management with Tylenol 4.  Emotional support.  Continue with BuSpar twice a day.  Ritalin to improve arousal 5.  Routine skin check/care 6.  Hypertension.  Continue with Norvasc, clonidine, Toprol 7.  Dysphagia.  On dysphagia #2. Thick liquids. 8.  Type 2 diabetes with neuropathy.  Continue with Glucotrol, Levemir. 9.  Hyperlipidemia.  Continue with Lipitor 10.  UTI.  Currently off antibiotics 11.  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  He may be a candidate for Botox.     Length of stay, days: 17  Sonda PrimesAlex Jove Beyl , MD 04/21/2018, 3:36 PM

## 2018-04-21 NOTE — Plan of Care (Signed)
  Problem: RH SAFETY Goal: RH STG ADHERE TO SAFETY PRECAUTIONS W/ASSISTANCE/DEVICE Description STG Adhere to Safety Precautions With cues/reminders supervision/Assistance/Device.  Outcome: Progressing   Problem: Consults Goal: RH STROKE PATIENT EDUCATION Description See Patient Education module for education specifics  Outcome: Not Progressing   Problem: RH BOWEL ELIMINATION Goal: RH STG MANAGE BOWEL WITH ASSISTANCE Description STG Manage Bowel with min Assistance.  Outcome: Not Progressing Goal: RH STG MANAGE BOWEL W/MEDICATION W/ASSISTANCE Description STG Manage Bowel with Medication with mod I Assistance.  Outcome: Not Progressing   Problem: RH BLADDER ELIMINATION Goal: RH STG MANAGE BLADDER WITH ASSISTANCE Description STG Manage Bladder With  Min Assistance  Outcome: Not Progressing   Problem: RH KNOWLEDGE DEFICIT Goal: RH STG INCREASE KNOWLEDGE OF DIABETES Description Daughter to be able to explain management of dm using medications na d dietary restrictions independently using handouts and resources. Dtr will be able to administer insulin independently  Outcome: Not Progressing Goal: RH STG INCREASE KNOWLEDGE OF HYPERTENSION Description Daughter to be able to explain management of HTN using medications na d dietary restrictions independently using handouts and resources  Outcome: Not Progressing Goal: RH STG INCREASE KNOWLEDGE OF DYSPHAGIA/FLUID INTAKE Description Dtr and pt will be able to demonstrate understanding of dysphagia diet restrictions and medication administration to avoid aspiration independently using handouts/resources  Outcome: Not Progressing Goal: RH STG INCREASE KNOWLEGDE OF HYPERLIPIDEMIA Description Daughter to be able to explain management of HLD using medications and dietary restrictions independently using handouts and resources  Outcome: Not Progressing Goal: RH STG INCREASE KNOWLEDGE OF STROKE PROPHYLAXIS Description Daughter to be able  to explain management of secondary stroke prevention using medications and dietary restrictions independently using handouts and resources  Outcome: Not Progressing

## 2018-04-22 ENCOUNTER — Inpatient Hospital Stay (HOSPITAL_COMMUNITY): Payer: Commercial Indemnity | Admitting: Physical Therapy

## 2018-04-22 LAB — GLUCOSE, CAPILLARY
GLUCOSE-CAPILLARY: 222 mg/dL — AB (ref 70–99)
Glucose-Capillary: 100 mg/dL — ABNORMAL HIGH (ref 70–99)
Glucose-Capillary: 217 mg/dL — ABNORMAL HIGH (ref 70–99)
Glucose-Capillary: 172 mg/dL — ABNORMAL HIGH (ref 70–99)

## 2018-04-22 NOTE — Progress Notes (Signed)
Douglas [name] is a 67 y.o. male [birth year] [id number]  Subjective:  No new problems.  He is aphasic  Objective: Vital signs in last 24 hours: Temp:  [97.5 F (36.4 C)-98.3 F (36.8 C)] 97.5 F (36.4 C) (12/22 0512) Pulse Rate:  [67-77] 67 (12/22 0512) Resp:  [12-18] 14 (12/22 0512) BP: (127-146)/(73-80) 144/73 (12/22 0512) SpO2:  [100 %] 100 % (12/22 0512) Weight change:  Last BM Date: 04/21/18  Intake/Output from previous day: 12/21 0701 - 12/22 0700 In: 420 [P.O.:420] Out: -  Last cbgs: CBG (last 3)  Recent Labs    04/21/18 2133 04/22/18 0628 04/22/18 1136  GLUCAP 119* 100* 217*     Physical Exam General: No apparent distress.  He is a phasic HEENT: not dry Lungs: Normal effort. Lungs clear to auscultation, no crackles or wheezes. Cardiovascular: Regular rate and rhythm, no edema Abdomen: S/NT/ND; BS(+) Musculoskeletal:  unchanged Neurological: No new neurological deficits Wounds: N/A    Skin: clear  Aging changes Mental state: Alert, cooperative    Lab Results: BMET    Component Value Date/Time   NA 137 04/11/2018 0659   K 4.0 04/11/2018 0659   CL 101 04/11/2018 0659   CO2 26 04/11/2018 0659   GLUCOSE 154 (H) 04/11/2018 0659   BUN 12 04/11/2018 0659   CREATININE 1.14 04/11/2018 0659   CALCIUM 9.3 04/11/2018 0659   GFRNONAA >60 04/11/2018 0659   GFRAA >60 04/11/2018 0659   CBC    Component Value Date/Time   WBC 6.4 04/08/2018 0618   RBC 4.40 04/08/2018 0618   HGB 12.8 (L) 04/08/2018 0618   HCT 38.3 (L) 04/08/2018 0618   PLT 237 04/08/2018 0618   MCV 87.0 04/08/2018 0618   MCH 29.1 04/08/2018 0618   MCHC 33.4 04/08/2018 0618   RDW 12.0 04/08/2018 0618   LYMPHSABS 1.3 04/08/2018 0618   MONOABS 0.6 04/08/2018 0618   EOSABS 0.2 04/08/2018 0618   BASOSABS 0.0 04/08/2018 0618    Studies/Results: No results found.  Medications: I have reviewed the patient's current medications.  Assessment/Plan:    1.  Large left MCA  infarction.  Right-sided weakness.  Continue with CIR. 2.  DVT prophylactic Lovenox 3.  Pain management with Tylenol 4.  Emotional support.  Continue with BuSpar twice a day.  Ritalin to improve arousal 5.  Routine skin care check/care 6.  Hypertension.  Continue with Norvasc, Toprol, clonidine. 7.  Dysphagia.  On dysphagia #2 thick liquids diet 8.  Type 2 diabetes with neuropathy.  Continue with Glucotrol, Levemir 9.  Hyperlipidemia.  Continue with Lipitor 10.  Urinary tract infection not on antibiotics at present 11.  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  He will be reevaluated for Botox injections later    Length of stay, days: 18  Sonda PrimesAlex Nevyn Bossman , MD 04/22/2018, 12:01 PM

## 2018-04-22 NOTE — Plan of Care (Signed)
  Problem: Consults Goal: RH STROKE PATIENT EDUCATION Description See Patient Education module for education specifics  Outcome: Progressing   Problem: RH BOWEL ELIMINATION Goal: RH STG MANAGE BOWEL WITH ASSISTANCE Description STG Manage Bowel with min Assistance.  Outcome: Progressing Goal: RH STG MANAGE BOWEL W/MEDICATION W/ASSISTANCE Description STG Manage Bowel with Medication with mod I Assistance.  Outcome: Progressing   Problem: RH BLADDER ELIMINATION Goal: RH STG MANAGE BLADDER WITH ASSISTANCE Description STG Manage Bladder With  Min Assistance  Outcome: Progressing   Problem: RH SAFETY Goal: RH STG ADHERE TO SAFETY PRECAUTIONS W/ASSISTANCE/DEVICE Description STG Adhere to Safety Precautions With cues/reminders supervision/Assistance/Device.  Outcome: Progressing   Problem: RH KNOWLEDGE DEFICIT Goal: RH STG INCREASE KNOWLEDGE OF DIABETES Description Daughter to be able to explain management of dm using medications na d dietary restrictions independently using handouts and resources. Dtr will be able to administer insulin independently  Outcome: Progressing Goal: RH STG INCREASE KNOWLEDGE OF HYPERTENSION Description Daughter to be able to explain management of HTN using medications na d dietary restrictions independently using handouts and resources  Outcome: Progressing Goal: RH STG INCREASE KNOWLEDGE OF DYSPHAGIA/FLUID INTAKE Description Dtr and pt will be able to demonstrate understanding of dysphagia diet restrictions and medication administration to avoid aspiration independently using handouts/resources  Outcome: Progressing Goal: RH STG INCREASE KNOWLEGDE OF HYPERLIPIDEMIA Description Daughter to be able to explain management of HLD using medications and dietary restrictions independently using handouts and resources  Outcome: Progressing Goal: RH STG INCREASE KNOWLEDGE OF STROKE PROPHYLAXIS Description Daughter to be able to explain management of  secondary stroke prevention using medications and dietary restrictions independently using handouts and resources  Outcome: Progressing   

## 2018-04-22 NOTE — Progress Notes (Signed)
Occupational Therapy Session Note  Patient Details  Name: Douglas CordiaWilliam L Nedeau Jr MRN: [id number] Date of Birth: 21-Jun-1950  Today's Date: 04/22/2018 OT Individual Time: 0915-1000 OT Individual Time Calculation (min): 45 min    Short Term Goals: Week 3:  OT Short Term Goal 1 (Week 3): Pt will complete basic transfer with +1 assist in order to decrease caregiver burden OT Short Term Goal 2 (Week 3): Pt will attend to R UE during functional task with max multi-modal cuing OT Short Term Goal 3 (Week 3): Pt will locate 1 item R of midline during functional task with mod cuing  Skilled Therapeutic Interventions/Progress Updates:    OT intervention with focus on bed mobility, sitting balance, dressing retraining, functional transfers, following one step commands, and activity tolerance to increase independence with BADLs.  Pt incontinent of bladder but pt able to perform hygiene when wash cloth placed in groin area. Pt offered urinal with no results.  Pt requires max A for rolling in bed to don hospital brief and pull pants over hips.  Pt assists with LB dressing tasks but requires max multimodal cues to attend to RLE during dressing.  Pt initiates pulling pants over hips while bridging in bed.  Pt engages in UB dressing tasks and initiates threading LUE and pulling shirt over his head.  Again, pt does not attend to his RUE during dressing tasks.  Pt required max A for squat pivot transfer.  Pt incontinent of bladder after positioning in w/c and transferred back to bed for toileting tasks.  Pt remained in bed with all needs within reach and bed alarm activated.   Therapy Documentation Precautions:  Precautions Precautions: Fall Precaution Comments: dense R hemi Restrictions Weight Bearing Restrictions: No Pain: Pain Assessment Pain Scale: Faces Faces Pain Scale: No hurt   Therapy/Group: Individual Therapy  Rich BraveLanier, Ulys Favia Chappell 04/22/2018, 12:24 PM

## 2018-04-23 ENCOUNTER — Inpatient Hospital Stay (HOSPITAL_COMMUNITY): Payer: Commercial Indemnity | Admitting: *Deleted

## 2018-04-23 ENCOUNTER — Inpatient Hospital Stay (HOSPITAL_COMMUNITY): Payer: Commercial Indemnity

## 2018-04-23 ENCOUNTER — Inpatient Hospital Stay (HOSPITAL_COMMUNITY): Payer: Commercial Indemnity | Admitting: Speech Pathology

## 2018-04-23 LAB — GLUCOSE, CAPILLARY
Glucose-Capillary: 150 mg/dL — ABNORMAL HIGH (ref 70–99)
Glucose-Capillary: 176 mg/dL — ABNORMAL HIGH (ref 70–99)
Glucose-Capillary: 152 mg/dL — ABNORMAL HIGH (ref 70–99)
Glucose-Capillary: 315 mg/dL — ABNORMAL HIGH (ref 70–99)

## 2018-04-23 NOTE — Progress Notes (Signed)
Speech Language Pathology Daily Session Note  Patient Details  Name: Douglas CordiaWilliam L Pincus [name] MRN: [id number] Date of Birth: 1951/02/14  Today's Date: 04/23/2018 SLP Individual Time: 0915-1000 SLP Individual Time Calculation (min): 45 min  Short Term Goals: Week 3: SLP Short Term Goal 1 (Week 3): Pt will consume therapeutic trials of thin liquids with minimal overt s/s of aspiration and min cues for use of swallowing precautions.  SLP Short Term Goal 2 (Week 3): Patient will consume current diet with minimal overt s/s of aspiration with Mod A verbal cues for use of swallowing compensatory strategies.  SLP Short Term Goal 3 (Week 3): Patient will match a functional item to an object drawing with 50% accuracy and Mod A multimodal cues.  SLP Short Term Goal 4 (Week 3): Pt will follow 1 step commands in >75% of opportunities with min assist multimodal cues.   SLP Short Term Goal 5 (Week 3): Pt will vocalize at the word level in >50% of opportunities with mod assist multimodal cues.   SLP Short Term Goal 6 (Week 3): Pt will answer basic, immediately relevant yes/no questions for >50% accuracy with min assist multimodal cues.    Skilled Therapeutic Interventions:  Skilled treatment session focused on dysphagia, communication and cognition goals. SLP facilitated session by providing yes/no questions about drinking. Pt vocalized "yea" x 1 for drinking. Pt consumed 12 oz of nectar thick liquids without any overt s/s of aspiration. Question perseverative behaviors when drinking but pt is maintaining hydration and no longer requires IV. Would have attempted trials of thin but pt consuming nectar when SLP entering room. Pt with no task initiation during any further attempted tasks and required Max A for sustained attention to tasks presented. Pt left upright in TIS chair, lap alarm on and all needs within reach. Continue per current plan of care.      Pain Pain Assessment Pain Scale: Faces Faces Pain Scale:  No hurt  Therapy/Group: Individual Therapy  Douglas [name] 04/23/2018, 9:51 AM

## 2018-04-23 NOTE — Progress Notes (Signed)
[facility] PHYSICAL MEDICINE & REHABILITATION PROGRESS NOTE   Subjective/Complaints:  Patient remains severely aphasic.   According to speech therapy who is working with patient today he is handling his nectar liquids quite well.  She may try thin liquids later this week  ROS: limited due to cognition/aphasia   Objective:   No results found. No results for input(s): WBC, HGB, HCT, PLT in the last 72 hours. No results for input(s): NA, K, CL, CO2, GLUCOSE, BUN, CREATININE, CALCIUM in the last 72 hours.  Intake/Output Summary (Last 24 hours) at 04/23/2018 1645 Last data filed at 04/23/2018 1300 Gross per 24 hour  Intake 360 ml  Output -  Net 360 ml     Physical Exam: Vital Signs Blood pressure 131/78, pulse 76, temperature 98.4 F (36.9 C), temperature source Oral, resp. rate 18, height 5\' 10"  (1.778 m), weight 89.1 kg, SpO2 100 %.  Constitutional: No distress . Vital signs reviewed. HENT: Normocephalic.  Atraumatic. Eyes: EOMI. No discharge. Cardiovascular: RRR.  No JVD. Respiratory: CTA bilaterally. Normal effort. GI: BS +. Non-distended. Musc: No edema or tenderness in extremities. Neurologic:  Alert. Motor:trace right biceps o/w    0/5  , 2- RLE  Hip/knee ext synergy, 0/5 at the ankle              moving left side spontaneously, unchanged Global aphasia Skin: No evidence of breakdown, no evidence of rash  Assessment/Plan: 1. Functional deficits secondary to Left MCA infarct R HP and Aphasia which require 3+ hours per day of interdisciplinary therapy in a comprehensive inpatient rehab setting.  Physiatrist is providing close team supervision and 24 hour management of active medical problems listed below.  Physiatrist and rehab team continue to assess barriers to discharge/monitor patient progress toward functional and medical goals  Care Tool:  Bathing  Bathing activity did not occur: Refused(Charge nurse notified) Body parts bathed by patient: Front perineal  area   Body parts bathed by helper: Right arm, Left arm, Chest, Abdomen, Front perineal area, Buttocks, Right upper leg, Left upper leg, Right lower leg, Left lower leg, Face     Bathing assist Assist Level: 2 Helpers     Upper Body Dressing/Undressing Upper body dressing   What is the patient wearing?: Pull over shirt    Upper body assist Assist Level: Moderate Assistance - Patient 50 - 74%    Lower Body Dressing/Undressing Lower body dressing      What is the patient wearing?: Pants, Incontinence brief     Lower body assist Assist for lower body dressing: Maximal Assistance - Patient 25 - 49%(bed level)     Toileting Toileting    Toileting assist Assist for toileting: Total Assistance - Patient < 25%     Transfers Chair/bed transfer  Transfers assist     Chair/bed transfer assist level: 2 Helpers     Locomotion Ambulation   Ambulation assist   Ambulation activity did not occur: Safety/medical concerns  Assist level: Dependent - Patient 0% Assistive device: Lite Gait Max distance: 25'   Walk 10 feet activity   Assist  Walk 10 feet activity did not occur: Safety/medical concerns  Assist level: Dependent - Patient 0% Assistive device: Lite Gait   Walk 50 feet activity   Assist Walk 50 feet with 2 turns activity did not occur: Safety/medical concerns  Assist level: Dependent - Patient 0% Assistive device: Lite Gait    Walk 150 feet activity   Assist Walk 150 feet activity did not occur: Safety/medical  concerns         Walk 10 feet on uneven surface  activity   Assist Walk 10 feet on uneven surfaces activity did not occur: Safety/medical concerns         Wheelchair     Assist Will patient use wheelchair at discharge?: Yes Type of Wheelchair: Manual Wheelchair activity did not occur: Safety/medical concerns  Wheelchair assist level: Total Assistance - Patient < 25% Max wheelchair distance: 100'    Wheelchair 50 feet with  2 turns activity    Assist    Wheelchair 50 feet with 2 turns activity did not occur: Safety/medical concerns   Assist Level: Total Assistance - Patient < 25%   Wheelchair 150 feet activity     Assist Wheelchair 150 feet activity did not occur: Safety/medical concerns   Assist Level: Total Assistance - Patient < 25%     Medical Problem List and Plan: 1.  Right side weakness with aphasia/dysphagia secondary to large left MCA infarction 03/30/18 as well as history of CVA 2013.aspirin and Plavix 3 months  Continue CIR PT, OT, SLP- rec SNF [facility] 2.  DVT Prophylaxis/Anticoagulation: subcutaneous Lovenox. Monitor for any bleeding episodes 3. Pain Management:  Tylenol as needed 4. Mood: BuSpar 7.5 mg twice a day, Provide emotional support  -ritalin to improve arousal, increased to 10mg  5. Neuropsych: This patient is capable of making decisions on his own behalf. 6. Skin/Wound Care:  Routine skin checks 7. Fluids/Electrolytes/Nutrition:   -Good fluid and caloric intake 8.Hypertension. Norvasc 10 mg daily, clonidine 0.2 mg daily, Toprol-XL 50 mg daily Vitals:   04/23/18 0528 04/23/18 1432  BP: 140/80 131/78  Pulse: 67 76  Resp: 18 18  Temp: 97.6 F (36.4 C) 98.4 F (36.9 C)  SpO2: 100% 100%   Labile on 12/19, controlled on 04/20/2018 9. Dysphagia. Dysphagia #2 nectar thick liquids. Follow-up speech therapy 10. Diabetes mellitus with peripheral neuropathy. Hemoglobin A1c 7.3. Glucotrol 5 mg twice a day,Levemir 15 units twice a day. Check blood sugars before meals and at bedtime. Diabetic teaching CBG (last 3)  Recent Labs    04/23/18 0631 04/23/18 1218 04/23/18 1642  GLUCAP 150* 176* 152*   levimir   21U BID,   glucotrol at home dose of 10mg      Will increase Levemir, patient is eating more 11. Hyperlipidemia. Lipitor 12.Suspect UTI. Urine study positive nitrites + bact .Begin empiric coverage-  Off abx 13.  Spasticity RUE and RLE --ROM with  PT, OT, tizanidine stopped due to sedation, flexor withdrawal RLE, he may be a good candidate for Botox but would likely have to have this done as an outpatient   LOS: 19 days A FACE TO FACE EVALUATION WAS PERFORMED  Erick Colacendrew E Anmarie Fukushima 04/23/2018, 4:45 PM

## 2018-04-23 NOTE — Progress Notes (Signed)
Occupational Therapy Session Note  Patient Details  Name: Douglas [name] MRN: [id number] Date of Birth: 05-Nov-1950  Today's Date: 04/23/2018 OT Individual Time: 0800-0900 OT Individual Time Calculation (min): 60 min    Short Term Goals: Week 3:  OT Short Term Goal 1 (Week 3): Pt will complete basic transfer with +1 assist in order to decrease caregiver burden OT Short Term Goal 2 (Week 3): Pt will attend to R UE during functional task with max multi-modal cuing OT Short Term Goal 3 (Week 3): Pt will locate 1 item R of midline during functional task with mod cuing  Skilled Therapeutic Interventions/Progress Updates:    OT intervention with focus on bed mobility, urinal use, sitting balance, functional transfers, LB dressing at bed level, UB dressing seated in w/c, grooming, following one step commands, and activity tolerance to increase independence with BADLs.  Pt indicated he needed to use urinal but was not successful.  Pt requires max A for rolling in bed with max multimodal cues to initiate task.  Pt initiates LB and UB dressing tasks but requires assistance to attend to R and complete tasks.  During dressing tasks pt incontinent of bladder and required tot A + 2 for hygiene and changing brief.  Pt required max A for supine>sit EOB with HOB elevated and max A for squat pivot transfer to w/c.  Pt engaged in shaving task at sink.  Pt washed face and applied shaving cream but required tot A to complete shaving task.  Pt remained in w/c with all needs within reach and belt alarm activated.  Therapy Documentation Precautions:  Precautions Precautions: Fall Precaution Comments: dense R hemi Restrictions Weight Bearing Restrictions: No   Pain: Pain Assessment Pain Scale: Faces Faces Pain Scale: No hurt   Therapy/Group: Individual Therapy  Rich BraveLanier, Elener Custodio Chappell 04/23/2018, 12:45 PM

## 2018-04-23 NOTE — Progress Notes (Signed)
Physical Therapy Session Note  Patient Details  Name: Douglas CordiaWilliam L Hynes Jr MRN: [id number] Date of Birth: [birth year]  Today's Date: 04/23/2018 PT Individual Time: [id number] PT Individual Time Calculation (min): 84 min   Short Term Goals: Week 3:  PT Short Term Goal 1 (Week 3): Pt will maintain midline during functional mobility, 50% of the time w/ min cues  PT Short Term Goal 2 (Week 3): Pt will transfer bed<>chair w/ max assist x1 PT Short Term Goal 3 (Week 3): Pt will perform sit<>stand w/ max assist x1 PT Short Term Goal 4 (Week 3): Pt will follow simple 1-step commands during functional mobility 75% of the time   Skilled Therapeutic Interventions/Progress Updates:    Pt supine in bed upon PT arrival, agreeable to therapy tx and no evidence of pain, unable to report. Pt trying to change his pants, therapist assisted pt to doff dirty pants and don clean shorts. Pt performs bridging with manual facilitation for placement while pt and second helper pull pants over hips. Pt transferred to sitting EOB with max assist, verbal cues for techniques. Pt with posterior lean in sitting, verbal/tactile cues for anterior weightshift to correct. Pt performed max +2 squat pivot to w/c and transported to the gym. Pt performed stand pivot to mat with max assist +2. Pt performed lateral scooting towards the L with mod assist. Pt performed sit<>stand within stedy and max assist +2 from elevated mat, in standing pt worked on maintaining midline with wall on left side for tactile cue to shift to left, therapist providing manual facilitation/max assist for hip extension and second helper blocking R knee. Pt worked on visual scanning, attention to both sides while sitting in high perch position on stedy seat, mirror for visual feedback and hand over hand assist to visually find cones performing reaching task, x 2 trials. Pt moved away from wall. Performed x 2 sit<>stands within stedy max assist +2, in standing continued  work on maintaining midline and upright posture, therapist provided manual facilitation working on lateral weightshifting. Pt performed lateral scoot transfer to a level surface back to w/c with slideboard and mod assist +2, increased time to complete, pt able to initiate and perform lateral scooting to L. Once in the chair pt requiring max +2 for repositioning of hips. Pt transported back to room and left seated in TIS with needs in reach and chair alarm set.   Therapy Documentation Precautions:  Precautions Precautions: Fall Precaution Comments: dense R hemi Restrictions Weight Bearing Restrictions: No    Therapy/Group: Individual Therapy  Cresenciano GenreEmily van Schagen, PT, DPT 04/23/2018, 8:02 AM

## 2018-04-24 ENCOUNTER — Inpatient Hospital Stay (HOSPITAL_COMMUNITY): Payer: Commercial Indemnity | Admitting: Speech Pathology

## 2018-04-24 ENCOUNTER — Inpatient Hospital Stay (HOSPITAL_COMMUNITY): Payer: Self-pay

## 2018-04-24 ENCOUNTER — Inpatient Hospital Stay (HOSPITAL_COMMUNITY): Payer: Commercial Indemnity

## 2018-04-24 ENCOUNTER — Inpatient Hospital Stay (HOSPITAL_COMMUNITY): Payer: Commercial Indemnity | Admitting: Physical Therapy

## 2018-04-24 LAB — GLUCOSE, CAPILLARY
Glucose-Capillary: 128 mg/dL — ABNORMAL HIGH (ref 70–99)
Glucose-Capillary: 228 mg/dL — ABNORMAL HIGH (ref 70–99)
Glucose-Capillary: 216 mg/dL — ABNORMAL HIGH (ref 70–99)

## 2018-04-24 NOTE — Patient Care Conference (Signed)
Inpatient RehabilitationTeam Conference and Plan of Care Update Date: 04/24/2018   Time: 11:00 AM    Patient Name: Douglas [name]      Medical Record Number: [id number]  Date of Birth: 1950/12/31 Sex: Male         Room/Bed: 4W22C/4W22C-01 Payor Info: Payor: MEDICARE / Plan: MEDICARE PART A AND B / Product Type: *No Product type* /    Admitting Diagnosis: CVA  Admit Date/Time:  04/04/2018  3:09 PM Admission Comments: No comment available   Primary Diagnosis:  <principal problem not specified> Principal Problem: <principal problem not specified>  Patient Active Problem List   Diagnosis Date Noted  . 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 Labile blood glucose   . Global aphasia   . Cerebral edema (HCC) 04/04/2018  . Renal insufficiency 04/04/2018  . 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 Acute lower UTI   . Type 2 diabetes mellitus with peripheral neuropathy (HCC)   . Acute embolic stroke (HCC)   . Diabetes 1.5, managed as type 2 (HCC)   . Essential hypertension   . Dyslipidemia   . History of CVA with residual deficit   . Dysphagia, post-stroke     Expected Discharge Date: Expected Discharge Date: 05/02/18  Team Members Present: Physician leading conference: Dr. Claudette LawsAndrew Kirsteins Social Worker Present: Dossie DerBecky Antony Sian, LCSW Nurse Present: Chana Bodeeborah Sharp, RN PT Present: Carlynn PurlAmy Tally, PT OT Present: Ardis Rowanom [name], COTA SLP Present: Reuel DerbyHappi Overton, SLP PPS Coordinator present : Tora DuckMarie Noel, RN, CRRN     Current Status/Progress Goal Weekly Team Focus  Medical   severe aphasia, RIght hemiparesis  maintain hydration po   Increase aciviy toleranc eand participatio   Bowel/Bladder   Pt is incontinent B/B. LBM 04/23/2018.  Maintain regular bowel pattern. Toilet Q 2 while pt is awake.  Assist with toileting needs PRN.   Swallow/Nutrition/ Hydration   Dysphagia 3 with nectar thick liquids - Min A to Mod A verbal cues   Mod A - downgraded 12/23  use of swallow strategies   ADL's   bed mobility-max A, UB dressing-mod A, LB dressing-max A bed level; squat pivot tranfser-max A; max verbal cues for task initiation, sequencing, and attention to R  mod A overall; min A toilet transfers  activity tolerance, attention to R, sitting balance, sit<>stand, functional tranfsers, BADL retraining, education   Mobility   mod-max bed mobility, max+2 transfers, max +2 standing and standing balance/orientation  min assist  standing, midline, attention, scanning, tone management, R NMR   Communication   Total A to Max A   Max A - downgraded 12/23  vocalizing on command, multimodal communication   Safety/Cognition/ Behavioral Observations  Total A to Max A   Max A - downgraded 12/23  attention, problem solving, task initiation, awareness   Pain   No complaints of pain.  Remain free of pain.  Assess pain Q shift and PRN.   Skin   No skin issues.  Maintain skin integrity and prevent skin breakdown.   Assess skin Q shift and PRN.      *See Care Plan and progress notes for long and short-term goals.     Barriers to Discharge  Current Status/Progress Possible Resolutions Date Resolved   Physician    Inaccessible home environment;Medical stability  [facility]  poor progress due to severe deficits  ?local SNF      Nursing  PT  Decreased caregiver support;Inaccessible home environment;Home environment access/layout;Medical stability                 OT                  SLP                SW                Discharge Planning/Teaching Needs:  Daughter here participating in therapies with pt. Slow gains and plan to go back [place] by car-[facility]      Team Discussion:  Slow gains in therapies, still requiring total care. Medically stable. [facility] Would be difficult to go back in car, due to  incontinent. Dys 3 nectar thck diet.  Revisions to Treatment Plan:  DC 1/1    Continued Need for Acute Rehabilitation Level of Care: The patient requires daily medical management by a physician with specialized training in physical medicine and rehabilitation for the following conditions: Daily direction of a multidisciplinary physical rehabilitation program to ensure safe treatment while eliciting the highest outcome that is of practical value to the patient.: Yes Daily medical management of patient stability for increased activity during participation in an intensive rehabilitation regime.: Yes Daily analysis of laboratory values and/or radiology reports with any subsequent need for medication adjustment of medical intervention for : Neurological problems   I attest that I was present, lead the team conference, and concur with the assessment and plan of the team.   Lucy Chrisupree, Rainey Kahrs G 04/24/2018, 12:53 PM

## 2018-04-24 NOTE — Progress Notes (Signed)
Occupational Therapy Session Note  Patient Details  Name: Douglas CordiaWilliam L Pignato Jr MRN: [id number] Date of Birth: November 04, 1950  Today's Date: 04/24/2018 OT Individual Time: 1000-1055 OT Individual Time Calculation (min): 55 min    Short Term Goals: Week 3:  OT Short Term Goal 1 (Week 3): Pt will complete basic transfer with +1 assist in order to decrease caregiver burden OT Short Term Goal 2 (Week 3): Pt will attend to R UE during functional task with max multi-modal cuing OT Short Term Goal 3 (Week 3): Pt will locate 1 item R of midline during functional task with mod cuing  Skilled Therapeutic Interventions/Progress Updates:    [name] present and observed therapy.  Lengthy discussion with daughter regarding pt's functional status (tot A), transfer status (max A/tot A), and cognitive status.  [name] but voiced concerns regarding transfers and toileting.  Discussed recommendation to perform ADLs at bed level including toileting.  OT intervention with focus on following one step commands, trunk mobility, head ROM, and family education.  Pt able to reach to L and R to retrieve bean bag on command.  Pt with delayed initiation of task after command.  Pt with limited head rotation to R/L at ~45 degrees. Pt returned to room and remained in w/c with all needs within reach and daughter present.  Belt alarm activated.   Therapy Documentation Precautions:  Precautions Precautions: Fall Precaution Comments: dense R hemi Restrictions Weight Bearing Restrictions: No  Pain:  Pt denies pain   Therapy/Group: Individual Therapy  Rich BraveLanier, Mallisa Alameda Chappell 04/24/2018, 10:57 AM

## 2018-04-24 NOTE — Progress Notes (Signed)
Physical Therapy Session Note  Patient Details  Name: Douglas [name] MRN: [id number] Date of Birth: 04-03-1951  Today's Date: 04/24/2018 PT Individual Time: 0850-0950 PT Individual Time Calculation (min): 60 min   Short Term Goals: Week 3:  PT Short Term Goal 1 (Week 3): Pt will maintain midline during functional mobility, 50% of the time w/ min cues  PT Short Term Goal 2 (Week 3): Pt will transfer bed<>chair w/ max assist x1 PT Short Term Goal 3 (Week 3): Pt will perform sit<>stand w/ max assist x1 PT Short Term Goal 4 (Week 3): Pt will follow simple 1-step commands during functional mobility 75% of the time   Skilled Therapeutic Interventions/Progress Updates:    Pt supine in bed upon PT arrival, agreeable to therapy tx and denies pain. Pt found to be incontinent of bladder this session. Therapist assisted pt with rolling mod assist to the R and max assist +2 to roll to the L. Pt then reaching for urinal to urinate again. Pt given washcloth, cleans peri area with supervision. Pt performed rolling in each direction and bridging to don briefs/pants. Pt transferred to sitting EOB with max assist. Pt maintains sitting balance with CGA-supervision while therapist dons teds/shoes dependently. Pt dons shirt with max assist. Pt performed slideboard transfer to the L with increased time secondary to slow motor planning, pt able to complete with mod assist. Pt seated in w/c at sink to don deodorant with max assist and wash face with supervision/set up. Pt transported to the gym. Pt performed lateral scoot with slideboard to the R with max assist and increased time. Sit<>stand from elevated mat within stedy max assist +2, mirror for visual feedback. Standing balance with max assist for hip extension, L arm over therapist shoulder and second helper blocking R knee. Pt performed reaching task to the L for cones while seated on elevated stedy seat, verbal/tactile cues for reaching. Pt transferred to w/c  with stedy dependent +2. Pt transported back to room and left seated in TIS with needs in reach and chair alarm set.     Therapy Documentation Precautions:  Precautions Precautions: Fall Precaution Comments: dense R hemi Restrictions Weight Bearing Restrictions: No   Therapy/Group: Individual Therapy  Cresenciano GenreEmily van Schagen, PT, DPT 04/24/2018, 6:46 AM

## 2018-04-24 NOTE — Progress Notes (Signed)
[facility] PHYSICAL MEDICINE & REHABILITATION PROGRESS NOTE   Subjective/Complaints:  Patient remains severely aphasic.   PT notes difficulty tracking bilaterally past 45 degrees.  Pt says yes to neck pain but not consistent with Y/N questions  ROS: limited due to cognition/aphasia   Objective:   No results found. No results for input(s): WBC, HGB, HCT, PLT in the last 72 hours. No results for input(s): NA, K, CL, CO2, GLUCOSE, BUN, CREATININE, CALCIUM in the last 72 hours.  Intake/Output Summary (Last 24 hours) at 04/24/2018 0924 Last data filed at 04/24/2018 0800 Gross per 24 hour  Intake 600 ml  Output -  Net 600 ml     Physical Exam: Vital Signs Blood pressure (!) 142/80, pulse 77, temperature 97.8 F (36.6 C), resp. rate 17, height '5\' 10"'  (1.778 m), weight 89.1 kg, SpO2 100 %.  Constitutional: No distress . Vital signs reviewed. HENT: Normocephalic.  Atraumatic. Eyes: EOMI. No discharge. Cardiovascular: RRR.  No JVD. Respiratory: CTA bilaterally. Normal effort. GI: BS +. Non-distended. Musc: No edema or tenderness in extremities. Neurologic:  Alert. Motor:trace right biceps o/w    0/5  , 2- RLE  Hip/knee ext synergy, 0/5 at the ankle              moving left side spontaneously, unchanged Global aphasia Skin: No evidence of breakdown, no evidence of rash  Assessment/Plan: 1. Functional deficits secondary to Left MCA infarct R HP and Aphasia which require 3+ hours per day of interdisciplinary therapy in a comprehensive inpatient rehab setting.  Physiatrist is providing close team supervision and 24 hour management of active medical problems listed below.  Physiatrist and rehab team continue to assess barriers to discharge/monitor patient progress toward functional and medical goals  Care Tool:  Bathing  Bathing activity did not occur: Refused(Charge nurse notified) Body parts bathed by patient: Front perineal area   Body parts bathed by helper: Right arm,  Left arm, Chest, Abdomen, Front perineal area, Buttocks, Right upper leg, Left upper leg, Right lower leg, Left lower leg, Face     Bathing assist Assist Level: 2 Helpers     Upper Body Dressing/Undressing Upper body dressing   What is the patient wearing?: Pull over shirt    Upper body assist Assist Level: Moderate Assistance - Patient 50 - 74%    Lower Body Dressing/Undressing Lower body dressing      What is the patient wearing?: Pants, Incontinence brief     Lower body assist Assist for lower body dressing: Maximal Assistance - Patient 25 - 49%(bed level)     Toileting Toileting    Toileting assist Assist for toileting: Total Assistance - Patient < 25%     Transfers Chair/bed transfer  Transfers assist     Chair/bed transfer assist level: 2 Helpers     Locomotion Ambulation   Ambulation assist   Ambulation activity did not occur: Safety/medical concerns  Assist level: Dependent - Patient 0% Assistive device: Lite Gait Max distance: 25'   Walk 10 feet activity   Assist  Walk 10 feet activity did not occur: Safety/medical concerns  Assist level: Dependent - Patient 0% Assistive device: Lite Gait   Walk 50 feet activity   Assist Walk 50 feet with 2 turns activity did not occur: Safety/medical concerns  Assist level: Dependent - Patient 0% Assistive device: Lite Gait    Walk 150 feet activity   Assist Walk 150 feet activity did not occur: Safety/medical concerns  Walk 10 feet on uneven surface  activity   Assist Walk 10 feet on uneven surfaces activity did not occur: Safety/medical concerns         Wheelchair     Assist Will patient use wheelchair at discharge?: Yes Type of Wheelchair: Manual Wheelchair activity did not occur: Safety/medical concerns  Wheelchair assist level: Total Assistance - Patient < 25% Max wheelchair distance: 100'    Wheelchair 50 feet with 2 turns activity    Assist    Wheelchair  50 feet with 2 turns activity did not occur: Safety/medical concerns   Assist Level: Total Assistance - Patient < 25%   Wheelchair 150 feet activity     Assist Wheelchair 150 feet activity did not occur: Safety/medical concerns   Assist Level: Total Assistance - Patient < 25%     Medical Problem List and Plan: 1.  Right side weakness with aphasia/dysphagia secondary to large left MCA infarction 03/30/18 as well as history of CVA 2013.aspirin and Plavix 3 months  Continue CIR PT, OT, SLP. Team conference today please see physician documentation under team conference tab, met with team face-to-face to discuss problems,progress, and goals. Formulized individual treatment plan based on medical history, underlying problem and comorbidities.- rec SNF prior to transport back to [facility] 2.  DVT Prophylaxis/Anticoagulation: subcutaneous Lovenox. Monitor for any bleeding episodes 3. Pain Management:  Tylenol as needed 4. Mood: BuSpar 7.5 mg twice a day, Provide emotional support  -ritalin to improve arousal, increased to 77m 5. Neuropsych: This patient is capable of making decisions on his own behalf. 6. Skin/Wound Care:  Routine skin checks 7. Fluids/Electrolytes/Nutrition:   -Good fluid and caloric intake 8.Hypertension. Norvasc 10 mg daily, clonidine 0.2 mg daily, Toprol-XL 50 mg daily Vitals:   04/23/18 1432 04/23/18 1928  BP: 131/78 (!) 142/80  Pulse: 76 77  Resp: 18 17  Temp: 98.4 F (36.9 C) 97.8 F (36.6 C)  SpO2: 100% 100%  controlled on 04/23/2018 9. Dysphagia. Dysphagia #2 nectar thick liquids. Follow-up speech therapy 10. Diabetes mellitus with peripheral neuropathy. Hemoglobin A1c 7.3. Glucotrol 5 mg twice a day,Levemir 15 units twice a day. Check blood sugars before meals and at bedtime. Diabetic teaching CBG (last 3)  Recent Labs    04/23/18 1642 04/23/18 2132 04/24/18 0645  GLUCAP 152* 315* 128*   levimir   23U BID,   glucotrol at home dose of 125m    Elevation at  noc, am CBG controlled, overall improving 11. Hyperlipidemia. Lipitor 12.Suspect UTI. Urine study positive nitrites + bact .Begin empiric coverage-  Off abx 13.  Spasticity RUE and RLE --ROM with PT, OT, tizanidine stopped due to sedation, flexor withdrawal RLE, he may be a good candidate for Botox but would likely have to have this done as an outpatient   LOS: 20 days A FACE TO FACE EVALUATION WAS PERFORMED  AnCharlett Blake2/24/2019, 9:24 AM

## 2018-04-24 NOTE — Progress Notes (Signed)
Social Work Patient ID: Douglas [name], male   DOB: [birth year], 67 y.o.   MRN: [id number] Met with pt and daughter to discuss team conference and slow progress and amount of care pt will require at home. Daughter's boyfriend is coming to assist with transporting him back to [facility]. Team feels this is not the best idea and recommend a handicapped [facility] or non-emergency ambulance both of which daughter can not afford. Pt when talking about this shuts down and feels they are putting him away. Daughter aware pt will need a hoyer lift, hospital bed and wheelchair for home. Have worked on home health agency to cover his therapies. Daughter wants to take pt home and has assist back in [facility] but needs to get him back there. Will ask nursing if can do bowel program prior to getting in the car and have wear a condom cath leg bag. Need to problem solve with staff and daughter.

## 2018-04-24 NOTE — Progress Notes (Signed)
Speech Language Pathology Daily Session Note  Patient Details  Name: Douglas [name] MRN: [id number] Date of Birth: 1951/04/24  Today's Date: 04/24/2018 SLP Individual Time: 1125-1145 SLP Individual Time Calculation (min): 20 min  Short Term Goals: Week 3: SLP Short Term Goal 1 (Week 3): Pt will consume therapeutic trials of thin liquids with minimal overt s/s of aspiration and min cues for use of swallowing precautions.  SLP Short Term Goal 2 (Week 3): Patient will consume current diet with minimal overt s/s of aspiration with Mod A verbal cues for use of swallowing compensatory strategies.  SLP Short Term Goal 3 (Week 3): Patient will match a functional item to an object drawing with 50% accuracy and Mod A multimodal cues.  SLP Short Term Goal 4 (Week 3): Pt will follow 1 step commands in >75% of opportunities with min assist multimodal cues.   SLP Short Term Goal 5 (Week 3): Pt will vocalize at the word level in >50% of opportunities with mod assist multimodal cues.   SLP Short Term Goal 6 (Week 3): Pt will answer basic, immediately relevant yes/no questions for >50% accuracy with min assist multimodal cues.    Skilled Therapeutic Interventions:  Skilled treatment focused on providing education to pt's daughter. She had questions regarding team conference. SLP reviewed pt's current level of care (including Total A for all activities except self-feeding once set-up). Team is not recommending that pt travel out of state d/t severity of deficits. SLP facilitated by suggesting possibility of van etc. CSW will follow up with pt's daughter. After discussing, pt largely refusal trials of thin liquids via cup. Despite Max A cues/encouragement pt consumed 1 sip with immediate and lengthy coughing. Support provided.      Pain Pain Assessment Pain Scale: 0-10  Therapy/Group: Individual Therapy  Jeani Fassnacht 04/24/2018, 12:29 PM

## 2018-04-24 NOTE — Progress Notes (Signed)
Occupational Therapy Session Note  Patient Details  Name: Douglas [name] MRN: [id number] Date of Birth: [birth year]  Today's Date: 04/24/2018 OT Individual Time: 1330-1400 OT Individual Time Calculation (min): 30 min    Short Term Goals: Week 2:  OT Short Term Goal 1 (Week 2): Pt will complete basic transfer with +1 assist in order to decrease caregiver burden OT Short Term Goal 1 - Progress (Week 2): Progressing toward goal OT Short Term Goal 2 (Week 2): Pt will attend to R UE during functional task with max multi-modal cuing OT Short Term Goal 2 - Progress (Week 2): Progressing toward goal OT Short Term Goal 3 (Week 2): Pt will locate 1 item R of midline during functional task with mod cuing OT Short Term Goal 3 - Progress (Week 2): Progressing toward goal Week 3:  OT Short Term Goal 1 (Week 3): Pt will complete basic transfer with +1 assist in order to decrease caregiver burden OT Short Term Goal 2 (Week 3): Pt will attend to R UE during functional task with max multi-modal cuing OT Short Term Goal 3 (Week 3): Pt will locate 1 item R of midline during functional task with mod cuing  Skilled Therapeutic Interventions/Progress Updates:    OT intervention with focus on functional transfers, sitting balance, and bed mobility.  Pt resting in w/c upon arrival.  Pt required max A with max multimodal cues for sequencing.  Pt sat EOB with supervision and practiced anterior/posterior weight shifts before returning to supine with max A.  Pt remained in bed with all needs within reach and bed alarm activated.   Therapy Documentation Precautions:  Precautions Precautions: Fall Precaution Comments: dense R hemi Restrictions Weight Bearing Restrictions: No  Pain: Pain Assessment Pain Scale: 0-10   Therapy/Group: Individual Therapy  Rich BraveLanier, Mattheu Brodersen Chappell 04/24/2018, 2:50 PM

## 2018-04-25 LAB — GLUCOSE, CAPILLARY
Glucose-Capillary: 187 mg/dL — ABNORMAL HIGH (ref 70–99)
Glucose-Capillary: 118 mg/dL — ABNORMAL HIGH (ref 70–99)
Glucose-Capillary: 196 mg/dL — ABNORMAL HIGH (ref 70–99)
Glucose-Capillary: 240 mg/dL — ABNORMAL HIGH (ref 70–99)
Glucose-Capillary: 213 mg/dL — ABNORMAL HIGH (ref 70–99)

## 2018-04-26 ENCOUNTER — Inpatient Hospital Stay (HOSPITAL_COMMUNITY): Payer: Commercial Indemnity | Admitting: Physical Therapy

## 2018-04-26 ENCOUNTER — Inpatient Hospital Stay (HOSPITAL_COMMUNITY): Payer: Commercial Indemnity

## 2018-04-26 ENCOUNTER — Inpatient Hospital Stay (HOSPITAL_COMMUNITY): Payer: Commercial Indemnity | Admitting: Speech Pathology

## 2018-04-26 LAB — GLUCOSE, CAPILLARY
Glucose-Capillary: 126 mg/dL — ABNORMAL HIGH (ref 70–99)
Glucose-Capillary: 181 mg/dL — ABNORMAL HIGH (ref 70–99)
Glucose-Capillary: 194 mg/dL — ABNORMAL HIGH (ref 70–99)

## 2018-04-26 MED ORDER — BACLOFEN 5 MG HALF TABLET
5.0000 mg | ORAL_TABLET | Freq: Two times a day (BID) | ORAL | Status: DC
  Administered 2018-04-26 – 2018-04-28 (×5): 5 mg via ORAL
  Filled 2018-04-26 (×5): qty 1

## 2018-04-26 NOTE — Progress Notes (Signed)
Speech Language Pathology Weekly Progress and Session Note  Patient Details  Name: Douglas [name] MRN: [id number] Date of Birth: 1950-05-03  Beginning of progress report period: April 19, 2018 End of progress report period: April 26, 2018  Today's Date: 04/26/2018 SLP Individual Time: 1400-1455 SLP Individual Time Calculation (min): 55 min  Short Term Goals: Week 3: SLP Short Term Goal 1 (Week 3): Pt will consume therapeutic trials of thin liquids with minimal overt s/s of aspiration and min cues for use of swallowing precautions.  SLP Short Term Goal 1 - Progress (Week 3): Not met SLP Short Term Goal 2 (Week 3): Patient will consume current diet with minimal overt s/s of aspiration with Mod A verbal cues for use of swallowing compensatory strategies.  SLP Short Term Goal 2 - Progress (Week 3): Met SLP Short Term Goal 3 (Week 3): Patient will match a functional item to an object drawing with 50% accuracy and Mod A multimodal cues.  SLP Short Term Goal 3 - Progress (Week 3): Not met SLP Short Term Goal 4 (Week 3): Pt will follow 1 step commands in >75% of opportunities with min assist multimodal cues.   SLP Short Term Goal 4 - Progress (Week 3): Not met SLP Short Term Goal 5 (Week 3): Pt will vocalize at the word level in >50% of opportunities with mod assist multimodal cues.   SLP Short Term Goal 5 - Progress (Week 3): Not met SLP Short Term Goal 6 (Week 3): Pt will answer basic, immediately relevant yes/no questions for >50% accuracy with min assist multimodal cues.   SLP Short Term Goal 6 - Progress (Week 3): Not met    New Short Term Goals: Week 4: SLP Short Term Goal 1 (Week 4): STGs=LTGs  Weekly Progress Updates: Patient has made minimal gains and has not met any STGs this reporting period. Currently, patient continues to be essentially nonverbal and utilizes gestures and intermittent vocalizations for communication of wants/needs. Patient also continues to demonstrate  impaired auditory comprehension and requires Max A multimodal cues to follow commands. Max A multimodal cues are also required for patient to complete functional and familiar tasks safely due impaired initiation, attention, problem solving and awareness. Patient is consuming Dys. 2 textures with nectar-thick liquids with intermittent overt s/s of aspiration and Mod A verbal cues for use of swallowing strategies. Overt s/s of aspiration also noted with trials of thin liquids. Patient's function limited by severe global aphasia, apraxia and cognitive impairments. Patient and family education ongoing, especially in regards to discharge planning. Patient would benefit from continued skilled SLP intervention to maximize his cognitive-linguistic and swallowing function prior to discharge.   Intensity: Minumum of 1-2 x/day, 30 to 90 minutes Frequency: 3 to 5 out of 7 days Duration/Length of Stay: 05/02/18 Treatment/Interventions: Cognitive remediation/compensation;Dysphagia/aspiration precaution training;Cueing hierarchy;Environmental controls;Internal/external aids;Multimodal communication approach;Patient/family education;Speech/Language facilitation;Functional tasks;Therapeutic Activities   Daily Session  Skilled Therapeutic Interventions: Skilled treatment session focused on dysphagia and cognitive goals. SLP facilitated session by providing Min A verbal cues for problem solving with oral care via the suction toothbrush. Patient consumed trials of ice chips without overt s/s of aspiration but intermittent poor awareness of bolus noted with what appeared to be delayed swallow initiation. Recommend ongoing trials with SLP.  Patient required extra time and Mod A verbal cues for a basic cognitive task that focused on making the correct amount of change from a field of 12 coins. Patient also traced basic shapes with extra time but required  total A to copy shape and resistive to assistance from SLP. Patient left  upright in tilt-in-space wheelchair with alarm on and and all needs within reach. Continue with current plan of care.      Pain Pain Assessment Pain Scale: 0-10 Faces Pain Scale: No hurt  Therapy/Group: Individual Therapy  Keyontay Stolz 04/26/2018, 3:04 PM

## 2018-04-26 NOTE — Progress Notes (Signed)
Occupational Therapy Session Note  Patient Details  Name: Douglas [name] MRN: [id number] Date of Birth: 1950/05/14  Today's Date: 04/26/2018 OT Individual Time: 1300-1400 OT Individual Time Calculation (min): 60 min    Short Term Goals: Week 3:  OT Short Term Goal 1 (Week 3): Pt will complete basic transfer with +1 assist in order to decrease caregiver burden OT Short Term Goal 2 (Week 3): Pt will attend to R UE during functional task with max multi-modal cuing OT Short Term Goal 3 (Week 3): Pt will locate 1 item R of midline during functional task with mod cuing  Skilled Therapeutic Interventions/Progress Updates:    Session focused on bathing, dressing, and grooming tasks. Pt received supine in bed with soiled brief- RN and NT alerted. Pt rolled R with min A and L with max A for brief management. Pt required tactile cues to follow directions on which body part to wash. Pants were donned with total A. Pt completed supine to EOB transfer with max A +1. Pt completed squat pivot with max +1, 2nd person for safety. At sink , pt able to doff shirt with mod A and wash all UB with min A. Pt completed shaving task with mod A for safety and cueing. Pt donned shirt with mod A overall. Pt was handed off to SLP in hallway.   Therapy Documentation Precautions:  Precautions Precautions: Fall Precaution Comments: dense R hemi Restrictions Weight Bearing Restrictions: No   Pain: Pain Assessment Pain Scale: Faces Faces Pain Scale: No hurt   Therapy/Group: Individual Therapy  Crissie ReeseSandra H Krissia Schreier 04/26/2018, 3:14 PM

## 2018-04-26 NOTE — Progress Notes (Addendum)
Physical Therapy Weekly Progress Note  Patient Details  Name: Douglas [name] MRN: [id number] Date of Birth: June 28, 1950  Beginning of progress report period: April 19, 2018 End of progress report period: April 26, 2018  Today's Date: 04/26/2018 PT Individual Time: 5329-9242 PT Individual Time Calculation (min): 70 min   Patient has met 3 of 4 short term goals. Pt continues to make limited progress towards LTGs 2/2 impaired cognition, increased R side tone, and L pushing tendencies. He continues to require +2 stand-by or hands-on assist for safety. Otherwise, he is mod-max assist overall for bed mobility and transfers.   Patient continues to demonstrate the following deficits muscle weakness and muscle joint tightness, decreased cardiorespiratoy endurance, impaired timing and sequencing, abnormal tone, unbalanced muscle activation, motor apraxia, decreased coordination and decreased motor planning, decreased visual perceptual skills and decreased visual motor skills, decreased midline orientation, decreased attention to right and decreased motor planning, decreased initiation, decreased attention, decreased awareness, decreased problem solving, decreased safety awareness, decreased memory and delayed processing and decreased sitting balance, decreased postural control, hemiplegia and decreased balance strategies and therefore will continue to benefit from skilled PT intervention to increase functional independence with mobility.  Patient not progressing toward long term goals.  See goal revision..  Plan of care revisions: LTGs downgraded to mod assist overall at w/c level. [name] (primary caregiver) aware of SNF recommendation, however insisting on taking pt home to [place] w/ her. She is also aware of all safety concerns w/ that including toileting during the trip, car transfers, and need for 24/7 assist.   PT Short Term Goals Week 3:  PT Short Term Goal 1 (Week 3): Pt will  maintain midline during functional mobility, 50% of the time w/ min cues  PT Short Term Goal 1 - Progress (Week 3): Met PT Short Term Goal 2 (Week 3): Pt will transfer bed<>chair w/ max assist x1 PT Short Term Goal 2 - Progress (Week 3): Met PT Short Term Goal 3 (Week 3): Pt will perform sit<>stand w/ max assist x1 PT Short Term Goal 3 - Progress (Week 3): Met PT Short Term Goal 4 (Week 3): Pt will follow simple 1-step commands during functional mobility 75% of the time  PT Short Term Goal 4 - Progress (Week 3): Progressing toward goal Week 4:  PT Short Term Goal 1 (Week 4): =LTGs due to ELOS  Skilled Therapeutic Interventions/Progress Updates:   Pt in supine and agreeable to therapy, no evidence or c/o pain. Session focused on functional mobility and d/c planning. Max assist to don pants, pt initiating pulling pants over hips on L side in supine, however unable to perform supine bridge despite max manual assist from therapist. Max assist transfer to EOB and to w/c via slide board transfer towards L side. 2nd helper providing slide board stabilization and max verbal/tactile/manual cues for technique. Worked on manual w/c propulsion via L hemi technique, supervision for 100' w/ increased time and min verbal and visual cues for obstacle negotiation. Educated daughter on benefits of manual w/c for self-propulsion and also for travel. Discussed education that would be required prior to d/c home including manual hoyer lift training, self-care tasks, and car transfer training. Daughter verbalized understanding and in agreement w/ training, states her boyfriend will be present Tuesday afternoon to practice car transfers w/ her car. Set-up car transfer in gym to simulate daughter's car height. Stand pivot transfer to/from car w/ max-total assist w/ 2nd helper providing hands-on for safety and max multimodal  cues for forward lean/anterior weight shifting and to minimize LUE pushing. Daughter's SUV is too high for  a safe SB transfer, ~28 inches. She also states her boyfriend is able to handle that heavy of a stand pivot transfer even when therapist mentioned concerns such as decreased initiation/motor planning, pushing, and RLE tone. Returned to room and performed SB transfer back to EOB, max-total assist x2. Ended session in supine, all needs in reach.   Therapy Documentation Precautions:  Precautions Precautions: Fall Precaution Comments: dense R hemi Restrictions Weight Bearing Restrictions: No  Therapy/Group: Individual Therapy  Shawntee Mainwaring K Cord Wilczynski 04/26/2018, 12:08 PM

## 2018-04-26 NOTE — Progress Notes (Addendum)
[facility] PHYSICAL MEDICINE & REHABILITATION PROGRESS NOTE   Subjective/Complaints: Patient seen sitting up in bed this morning.  No reported issues overnight.  ROS: limited due to aphasia   Objective:   No results found. No results for input(s): WBC, HGB, HCT, PLT in the last 72 hours. No results for input(s): NA, K, CL, CO2, GLUCOSE, BUN, CREATININE, CALCIUM in the last 72 hours.  Intake/Output Summary (Last 24 hours) at 04/26/2018 0841 Last data filed at 04/26/2018 16100832 Gross per 24 hour  Intake 702 ml  Output 150 ml  Net 552 ml     Physical Exam: Vital Signs Blood pressure 131/77, pulse 60, temperature 97.6 F (36.4 C), resp. rate 17, height 5\' 10"  (1.778 m), weight 89.1 kg, SpO2 100 %.  Constitutional: No distress . Vital signs reviewed. HENT: Normocephalic.  Atraumatic. Eyes: EOMI. No discharge. Cardiovascular: RRR.  No JVD. Respiratory: CTA bilaterally.  Normal effort. GI: BS +. Non-distended. Musc: No edema or tenderness in extremities. Neurologic:  Alert. Motor: Moving left side spontaneously, no movement noted on right side Significant tone on right side Global aphasia Skin: No evidence of breakdown, no evidence of rash  Assessment/Plan: 1. Functional deficits secondary to Left MCA infarct R HP and Aphasia which require 3+ hours per day of interdisciplinary therapy in a comprehensive inpatient rehab setting.  Physiatrist is providing close team supervision and 24 hour management of active medical problems listed below.  Physiatrist and rehab team continue to assess barriers to discharge/monitor patient progress toward functional and medical goals  Care Tool:  Bathing  Bathing activity did not occur: Refused(Charge nurse notified) Body parts bathed by patient: Front perineal area   Body parts bathed by helper: Right arm, Left arm, Chest, Abdomen, Front perineal area, Buttocks, Right upper leg, Left upper leg, Right lower leg, Left lower leg, Face     Bathing assist Assist Level: 2 Helpers     Upper Body Dressing/Undressing Upper body dressing   What is the patient wearing?: Pull over shirt    Upper body assist Assist Level: Moderate Assistance - Patient 50 - 74%    Lower Body Dressing/Undressing Lower body dressing      What is the patient wearing?: Pants, Incontinence brief     Lower body assist Assist for lower body dressing: Maximal Assistance - Patient 25 - 49%(bed level)     Toileting Toileting    Toileting assist Assist for toileting: Total Assistance - Patient < 25%     Transfers Chair/bed transfer  Transfers assist     Chair/bed transfer assist level: Maximal Assistance - Patient 25 - 49%     Locomotion Ambulation   Ambulation assist   Ambulation activity did not occur: Safety/medical concerns  Assist level: Dependent - Patient 0% Assistive device: Lite Gait Max distance: 25'   Walk 10 feet activity   Assist  Walk 10 feet activity did not occur: Safety/medical concerns  Assist level: Dependent - Patient 0% Assistive device: Lite Gait   Walk 50 feet activity   Assist Walk 50 feet with 2 turns activity did not occur: Safety/medical concerns  Assist level: Dependent - Patient 0% Assistive device: Lite Gait    Walk 150 feet activity   Assist Walk 150 feet activity did not occur: Safety/medical concerns         Walk 10 feet on uneven surface  activity   Assist Walk 10 feet on uneven surfaces activity did not occur: Safety/medical concerns  Wheelchair     Assist Will patient use wheelchair at discharge?: Yes Type of Wheelchair: Manual Wheelchair activity did not occur: Safety/medical concerns  Wheelchair assist level: Total Assistance - Patient < 25% Max wheelchair distance: 100'    Wheelchair 50 feet with 2 turns activity    Assist    Wheelchair 50 feet with 2 turns activity did not occur: Safety/medical concerns   Assist Level: Total Assistance  - Patient < 25%   Wheelchair 150 feet activity     Assist Wheelchair 150 feet activity did not occur: Safety/medical concerns   Assist Level: Total Assistance - Patient < 25%     Medical Problem List and Plan: 1.  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  DVT Prophylaxis/Anticoagulation: subcutaneous Lovenox. Monitor for any bleeding episodes 3. Pain Management:  Tylenol as needed 4. Mood: BuSpar 7.5 mg twice a day, Provide emotional support  -ritalin to improve arousal, increased to 10mg  5. Neuropsych: This patient is capable of making decisions on his own behalf. 6. Skin/Wound Care:  Routine skin checks 7. Fluids/Electrolytes/Nutrition:   -Good fluid and caloric intake 8.Hypertension. Norvasc 10 mg daily, clonidine 0.2 mg daily, Toprol-XL 50 mg daily Vitals:   04/25/18 1951 04/26/18 0518  BP: (!) 150/92 131/77  Pulse: 77 60  Resp: 17 17  Temp: 97.7 F (36.5 C) 97.6 F (36.4 C)  SpO2: 99% 100%   Labile on 12/26 9. Dysphagia. Dysphagia #2 nectar thick liquids. Follow-up speech therapy 10. Diabetes mellitus with peripheral neuropathy. Hemoglobin A1c 7.3. Glucotrol 5 mg twice a day,Levemir 15 units twice a day. Check blood sugars before meals and at bedtime. Diabetic teaching CBG (last 3)  Recent Labs    04/25/18 1622 04/25/18 2218 04/26/18 0622  GLUCAP 240* 213* 126*   levimir   23U BID,   glucotrol at home dose of 10mg      Labile on 12/26 11. Hyperlipidemia. Lipitor 12.Suspect UTI. Urine study positive nitrites + bact .Begin empiric coverage-  Off abx 13.  Spasticity RUE and RLE --ROM with PT, OT, tizanidine stopped due to sedation, he may be a good candidate for Botox but would likely have to have this done as an outpatient  Trial baclofen on 12/26  Bracing ordered  LOS: 22 days A FACE TO FACE EVALUATION WAS PERFORMED  Ankit Karis Jubanil  Patel 04/26/2018, 8:41 AM

## 2018-04-26 NOTE — Progress Notes (Signed)
Orthopedic Tech Progress Note Patient Details:  Ashok CordiaWilliam L Faivre Jr [birth year] [id number]  Patient ID: Ashok CordiaWilliam L Mierzwa Jr, male   DOB: [birth year], 67 y.o.   MRN: [id number] Called in order to hanger.  Trinna PostMartinez, Janaa Acero J 04/26/2018, 9:24 AM

## 2018-04-27 ENCOUNTER — Inpatient Hospital Stay (HOSPITAL_COMMUNITY): Payer: Commercial Indemnity | Admitting: Physical Therapy

## 2018-04-27 ENCOUNTER — Inpatient Hospital Stay (HOSPITAL_COMMUNITY): Payer: Commercial Indemnity | Admitting: Speech Pathology

## 2018-04-27 ENCOUNTER — Inpatient Hospital Stay (HOSPITAL_COMMUNITY): Payer: Commercial Indemnity | Admitting: *Deleted

## 2018-04-27 ENCOUNTER — Inpatient Hospital Stay (HOSPITAL_COMMUNITY): Payer: Commercial Indemnity

## 2018-04-27 LAB — GLUCOSE, CAPILLARY
GLUCOSE-CAPILLARY: 147 mg/dL — AB (ref 70–99)
Glucose-Capillary: 230 mg/dL — ABNORMAL HIGH (ref 70–99)
Glucose-Capillary: 291 mg/dL — ABNORMAL HIGH (ref 70–99)
Glucose-Capillary: 271 mg/dL — ABNORMAL HIGH (ref 70–99)

## 2018-04-27 NOTE — Progress Notes (Signed)
[facility] PHYSICAL MEDICINE & REHABILITATION PROGRESS NOTE   Subjective/Complaints: Patient seen being transferred back to bed by therapy this morning.  No reported issues overnight.  ROS: limited due to aphasia   Objective:   No results found. No results for input(s): WBC, HGB, HCT, PLT in the last 72 hours. No results for input(s): NA, K, CL, CO2, GLUCOSE, BUN, CREATININE, CALCIUM in the last 72 hours.  Intake/Output Summary (Last 24 hours) at 04/27/2018 1154 Last data filed at 04/27/2018 0840 Gross per 24 hour  Intake 852 ml  Output 50 ml  Net 802 ml     Physical Exam: Vital Signs Blood pressure 137/78, pulse 72, temperature 97.9 F (36.6 C), temperature source Oral, resp. rate 17, height 5\' 10"  (1.778 m), weight 89.1 kg, SpO2 100 %.  Constitutional: No distress . Vital signs reviewed. HENT: Normocephalic.  Atraumatic. Eyes: EOMI. No discharge. Cardiovascular: RRR.  No JVD. Respiratory: CTA bilaterally.  Normal effort. GI: BS +. Non-distended. Musc: No edema or tenderness in extremities. Neurologic:  Alert. Motor: Moving left side spontaneously, no movement noted on right side, unchanged Significant tone on right side, unchanged Global aphasia Skin: No evidence of breakdown, no evidence of rash  Assessment/Plan: 1. Functional deficits secondary to Left MCA infarct R HP and Aphasia which require 3+ hours per day of interdisciplinary therapy in a comprehensive inpatient rehab setting.  Physiatrist is providing close team supervision and 24 hour management of active medical problems listed below.  Physiatrist and rehab team continue to assess barriers to discharge/monitor patient progress toward functional and medical goals  Care Tool:  Bathing  Bathing activity did not occur: Refused(Charge nurse notified) Body parts bathed by patient: Front perineal area, Right arm, Chest, Abdomen   Body parts bathed by helper: Right arm, Buttocks, Right upper leg, Left  upper leg, Right lower leg, Left lower leg     Bathing assist Assist Level: Moderate Assistance - Patient 50 - 74%     Upper Body Dressing/Undressing Upper body dressing   What is the patient wearing?: Pull over shirt    Upper body assist Assist Level: Moderate Assistance - Patient 50 - 74%    Lower Body Dressing/Undressing Lower body dressing      What is the patient wearing?: Pants, Incontinence brief     Lower body assist Assist for lower body dressing: Maximal Assistance - Patient 25 - 49%     Toileting Toileting    Toileting assist Assist for toileting: Total Assistance - Patient < 25%     Transfers Chair/bed transfer  Transfers assist     Chair/bed transfer assist level: 2 Helpers     Locomotion Ambulation   Ambulation assist   Ambulation activity did not occur: Safety/medical concerns  Assist level: Dependent - Patient 0% Assistive device: Lite Gait Max distance: 25'   Walk 10 feet activity   Assist  Walk 10 feet activity did not occur: Safety/medical concerns  Assist level: Dependent - Patient 0% Assistive device: Lite Gait   Walk 50 feet activity   Assist Walk 50 feet with 2 turns activity did not occur: Safety/medical concerns  Assist level: Dependent - Patient 0% Assistive device: Lite Gait    Walk 150 feet activity   Assist Walk 150 feet activity did not occur: Safety/medical concerns         Walk 10 feet on uneven surface  activity   Assist Walk 10 feet on uneven surfaces activity did not occur: Safety/medical concerns  Wheelchair     Assist Will patient use wheelchair at discharge?: Yes Type of Wheelchair: Manual Wheelchair activity did not occur: Safety/medical concerns  Wheelchair assist level: Supervision/Verbal cueing Max wheelchair distance: 100'    Wheelchair 50 feet with 2 turns activity    Assist    Wheelchair 50 feet with 2 turns activity did not occur: Safety/medical  concerns   Assist Level: Supervision/Verbal cueing   Wheelchair 150 feet activity     Assist Wheelchair 150 feet activity did not occur: Safety/medical concerns   Assist Level: Total Assistance - Patient < 25%     Medical Problem List and Plan: 1.  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  DVT Prophylaxis/Anticoagulation: subcutaneous Lovenox. Monitor for any bleeding episodes 3. Pain Management:  Tylenol as needed 4. Mood: BuSpar 7.5 mg twice a day, Provide emotional support  -ritalin to improve arousal, increased to 10mg  5. Neuropsych: This patient is capable of making decisions on his own behalf. 6. Skin/Wound Care:  Routine skin checks 7. Fluids/Electrolytes/Nutrition:   -Good fluid and caloric intake 8.Hypertension. Norvasc 10 mg daily, clonidine 0.2 mg daily, Toprol-XL 50 mg daily Vitals:   04/27/18 0522 04/27/18 0824  BP: 111/85 137/78  Pulse: 61 72  Resp: 17   Temp: 97.9 F (36.6 C)   SpO2: 100%    Relatively controlled on 12/27 9. Dysphagia. Dysphagia #2 nectar thick liquids. Follow-up speech therapy 10. Diabetes mellitus with peripheral neuropathy. Hemoglobin A1c 7.3. Glucotrol 5 mg twice a day,Levemir 15 units twice a day. Check blood sugars before meals and at bedtime. Diabetic teaching CBG (last 3)  Recent Labs    04/26/18 2132 04/27/18 0625 04/27/18 1128  GLUCAP 194* 147* 230*   levimir   23U BID,   glucotrol at home dose of 10mg      Labile on 12/27, will consider further increase in medications tomorrow if persistently elevated 11. Hyperlipidemia. Lipitor 12.Suspect UTI. Urine study positive nitrites + bact .Begin empiric coverage-  Off abx 13.  Spasticity RUE and RLE --ROM with PT, OT, tizanidine stopped due to sedation, he may be a good candidate for Botox but would likely have to have this done as an outpatient  Trial  baclofen on 12/26  Bracing ordered  LOS: 23 days A FACE TO FACE EVALUATION WAS PERFORMED  Ankit Karis JubaAnil Patel 04/27/2018, 11:54 AM

## 2018-04-27 NOTE — Progress Notes (Signed)
Occupational Therapy Session Note  Patient Details  Name: Douglas CordiaWilliam L Dagher Jr MRN: [id number] Date of Birth: February 28, 1951  Today's Date: 04/27/2018 OT Individual Time: 0700-0800 OT Individual Time Calculation (min): 60 min    Short Term Goals: Week 4:  OT Short Term Goal 1 (Week 4): STG=LTG secondary to ELOS (LTG downgraded)  Skilled Therapeutic Interventions/Progress Updates:    OT intervention with focus on bed mobility, sitting balance, functional transfers, BADL retraining, task initiation, sequencing, and following one step commands to increase independence with bADLs and decrease caregiver burden. Pt asleep upon arrival but easily aroused.  Pt indicated he wanted to use urinal but unsuccessful.  LB dressing at bed level with pt initiating threading LLE into pants and pulling over hips.  Pt required max A for supine>sit and CGA for sitting balance.  Squat pivot transfers with max A and max multimodal cues for sequencing.  Pt completed UB dressing tasks seated in w/c with mod A.  Pt indicated he needed to use urinal after seated in w/c and was incontinent.  Pt tranfsered back to bed with max A for squat pivot tranfser.  NT present to completed clothing change, bathing, etc.    Therapy Documentation Precautions:  Precautions Precautions: Fall Precaution Comments: dense R hemi Restrictions Weight Bearing Restrictions: No Pain: Pain Assessment Pain Scale: Faces Pain Score: 0-No pain Faces Pain Scale: No hurt   Therapy/Group: Individual Therapy  Rich BraveLanier, Aydeen Blume Chappell 04/27/2018, 12:00 PM

## 2018-04-27 NOTE — Progress Notes (Signed)
Occupational Therapy Session Note  Patient Details  Name: Douglas [name] MRN: [id number] Date of Birth: 1950/05/30  Today's Date: 04/27/2018 OT Individual Time: 1530-1610 OT Individual Time Calculation (min): 40 min    Short Term Goals: Week 3:  OT Short Term Goal 1 (Week 3): Pt will complete basic transfer with +1 assist in order to decrease caregiver burden OT Short Term Goal 1 - Progress (Week 3): Met OT Short Term Goal 2 (Week 3): Pt will attend to R UE during functional task with max multi-modal cuing OT Short Term Goal 2 - Progress (Week 3): Met OT Short Term Goal 3 (Week 3): Pt will locate 1 item R of midline during functional task with mod cuing OT Short Term Goal 3 - Progress (Week 3): Progressing toward goal  Skilled Therapeutic Interventions/Progress Updates:    1:1. Pt received in TIS with daughter present throughout session. Pt with continent void in urinal and A to place urinal. Pt completes 2x71mn dynavision with stimulus showing in bottom 2 quadrants with round 1 door open to auditory stimuli for selective attention work and round 2 door shut. Pt requires 1.8 seconds to find stimuli on L quadrant (both rounds) and 3.5-3.6 seconds in R quadrant and no VC for head turns/scanning R. Pt transfers total A+2 (to R) via squat pivot. Educated daughter with PROM of RUE and provided family with handout to decrease risk of contracture. Exited session with pt seated in bed, call light in reach and exit alarm on.  Therapy Documentation Precautions:  Precautions Precautions: Fall Precaution Comments: dense R hemi Restrictions Weight Bearing Restrictions: No  Therapy/Group: Individual Therapy  STonny Branch12/27/2019, 4:13 PM

## 2018-04-27 NOTE — Progress Notes (Signed)
Social Work Patient ID: Douglas CordiaWilliam L Labrosse [name], male   DOB: 1950-07-09, 67 y.o.   MRN: [id number] Have made [place] hospital bed and [place] [employer] [place] [facility] [place] and have found [facility] health agency via [facility] [place] [facility]. Daughter will be here for education until discharge date and her boyfriend is coming in Tuesday to do an actual car transfer. [facility]

## 2018-04-27 NOTE — Progress Notes (Signed)
Physical Therapy Session Note  Patient Details  Name: Douglas [name] MRN: [id number] Date of Birth: 1950/07/05  Today's Date: 04/27/2018 PT Individual Time: 0900-1000 PT Individual Time Calculation (min): 60 min   Short Term Goals: Week 4:  PT Short Term Goal 1 (Week 4): =LTGs due to ELOS  Skilled Therapeutic Interventions/Progress Updates:    Patient received in bed, appearing willing to participate in PT session. Able to transfer to EOB with maxA, and required heavy MaxAx1/MinAx1 for initial squat pivot transfer to chair due to difficulty sequencing and bringing head forward. Shoes applied totalA, transported to PT gym [facility]A in [name] and he was then able to tolerate standing in standing frame with upright posture and min guardx2 for approximately 10-12 minutes with Mod cues to reduce pushing trunk backwards into excessive extension in standing frame, incorporated reaching activities with high-fives with L UE while standing. Then transported to PT gym with [facility] in [name], worked on standing in and out of stedy frame with maxAx2 and Max cues to come to full upright standing position. Also worked on activities promoting head-hips relationship to improve transfers. Able to complete one squat-pivot with maxAx1/min guardx1 for safety, after standing and head-hips exercises was then able to complete squat pivot transfer to chair with modAx1/min guard x1 for safety. Daughter present for session and educated on transfer technique and general safety, PT also answered questions regarding e-stim and botox for tone. He was left up in his WC with seat belt alarm active, daughter present, and all needs otherwise met this morning.   Therapy Documentation Precautions:  Precautions Precautions: Fall Precaution Comments: dense R hemi Restrictions Weight Bearing Restrictions: No General: Pain: Pain Assessment Pain Scale: Faces Pain Score: 0-No pain Faces Pain Scale: No hurt    Therapy/Group: Individual  Therapy  Deniece Ree PT, DPT, CBIS  Supplemental Physical Therapist [facility]    Pager [phone] Acute Rehab Office [phone]   04/27/2018, 11:26 AM

## 2018-04-27 NOTE — Progress Notes (Signed)
Occupational Therapy Weekly Progress Note  Patient Details  Name: Douglas [name] MRN: [id number] Date of Birth: [birth year]  Beginning of progress report period: April 20, 2018 End of progress report period: April 27, 2018  Patient has met 2 of 3 short term goals.  Pt continues to make slow gains with sitting balance, functional transfers, initiating dressing tasks, and following one step commands.  Pt perseverates on washing face when presented wash cloth but bathes other areas when wash cloth placed on body part to be washed. Pt initiates UB/LB dressing tasks but requires max multimodal cues to attend to RUE/RLE during dressing tasks.  LB dressing tasks currently completed at bed level and UB dressing tasks seated in w/c.  Pt maintains sitting balance EOB with CGA.  Pt performs squat pivot transfers with max A. Daughter has been present for therapy but has not participated in [facility]. Long tern goals downgraded secondary to lack of progress during this admission.   Patient continues to demonstrate the following deficits: muscle weakness, decreased cardiorespiratoy endurance, abnormal tone, unbalanced muscle activation, decreased coordination and decreased motor planning, decreased visual perceptual skills, decreased attention to right and right side neglect, decreased initiation, decreased attention, decreased awareness, decreased problem solving, decreased safety awareness, decreased memory and delayed processing and decreased sitting balance, decreased standing balance, decreased postural control, hemiplegia and decreased balance strategies and therefore will continue to benefit from skilled OT intervention to enhance overall performance with BADL.  Patient not progressing toward long term goals.  See goal revision..  Plan of care revisions: Pt's sitting balance goals have been downgraded to min A and his dressing goals to max A. . Pt's POC and focus of treatment has been altered to reflect  reducing caregiver burden, as pt's daughter will be taking him home at a high burden of care.  OT Short Term Goals Week 3:  OT Short Term Goal 1 (Week 3): Pt will complete basic transfer with +1 assist in order to decrease caregiver burden OT Short Term Goal 1 - Progress (Week 3): Met OT Short Term Goal 2 (Week 3): Pt will attend to R UE during functional task with max multi-modal cuing OT Short Term Goal 2 - Progress (Week 3): Met OT Short Term Goal 3 (Week 3): Pt will locate 1 item R of midline during functional task with mod cuing OT Short Term Goal 3 - Progress (Week 3): Progressing toward goal Week 4:  OT Short Term Goal 1 (Week 4): STG=LTG secondary to ELOS (LTG downgraded)   [name] 04/27/2018, 11:46 AM

## 2018-04-27 NOTE — Progress Notes (Signed)
Speech Language Pathology Daily Session Note  Patient Details  Name: Douglas [name] MRN: [id number] Date of Birth: [birth year]  Today's Date: 04/27/2018 SLP Individual Time: 1300-1340 SLP Individual Time Calculation (min): 40 min  Short Term Goals: Week 4: SLP Short Term Goal 1 (Week 4): STGs=LTGs  Skilled Therapeutic Interventions: Skilled treatment session focused on dysphagia and communication goals. Upon arrival, patient was awake while upright in the wheelchair but appeared lethargic. SLP facilitated session by providing Min A verbal cues for problem solving with oral care via the suction toothbrush. Patient consumed trials of thin liquids via cup and demonstrated consistent overt s/s of aspiration, suspect due to delayed swallow initiation. Due to continued overt s/s of aspiration and patient's inability to utilize compensatory strategies at this time due to cognitive-linguistic impairments, a repeat MBS is not warranted at this time. SLP also facilitated session by providing Max A multimodal cues for matching an object drawing to a functional item from a field of 2 with 60% accuracy with patient choosing the functional item on the right every time despite Max A multimodal cues. Patient left upright in tilt-in-space wheelchair with all needs within reach. Continue with current plan of care.      Pain Pain Assessment Pain Scale: Faces Pain Score: 0-No pain Faces Pain Scale: No hurt  Therapy/Group: Individual Therapy  Mehr Depaoli 04/27/2018, 3:05 PM

## 2018-04-28 ENCOUNTER — Inpatient Hospital Stay (HOSPITAL_COMMUNITY): Payer: Commercial Indemnity | Admitting: *Deleted

## 2018-04-28 LAB — GLUCOSE, CAPILLARY
GLUCOSE-CAPILLARY: 158 mg/dL — AB (ref 70–99)
Glucose-Capillary: 162 mg/dL — ABNORMAL HIGH (ref 70–99)
Glucose-Capillary: 211 mg/dL — ABNORMAL HIGH (ref 70–99)
Glucose-Capillary: 203 mg/dL — ABNORMAL HIGH (ref 70–99)

## 2018-04-28 MED ORDER — BACLOFEN 10 MG PO TABS
10.0000 mg | ORAL_TABLET | Freq: Two times a day (BID) | ORAL | Status: DC
  Administered 2018-04-28 – 2018-05-01 (×6): 10 mg via ORAL
  Filled 2018-04-28 (×6): qty 1

## 2018-04-28 MED ORDER — INSULIN ASPART 100 UNIT/ML ~~LOC~~ SOLN
2.0000 [IU] | Freq: Three times a day (TID) | SUBCUTANEOUS | Status: DC
  Administered 2018-04-28 – 2018-04-29 (×5): 2 [IU] via SUBCUTANEOUS

## 2018-04-28 NOTE — Progress Notes (Signed)
Patient swinging at staff when attempting to give HS insulin. Able to give later when patient more calm. Alfredo MartinezMurray, Liban Guedes A

## 2018-04-28 NOTE — Progress Notes (Signed)
Occupational Therapy Session Note  Patient Details  Name: Douglas [name] MRN: [id number] Date of Birth: March 14, 1951  Today's Date: 04/28/2018 OT Individual Time: [id number] OT Individual Time Calculation (min): 40 min    Short Term Goals: Week 4:  OT Short Term Goal 1 (Week 4): STG=LTG secondary to ELOS (LTG downgraded)  Skilled Therapeutic Interventions/Progress Updates:    Pt sitting in bed eating breakfast upon arrival with NT present.  Pt continued with eating breakfast and performed grooming tasks supine with HOB elevated.  Attempted to engaged in pt in self care (bathing at bed level) and donning shirt.  Pt required max A for supine>sit EOB and CGA for sitting balance EOB.  Pt adamantly refused to doff shirt, pushing therapist's hand away.  Several options of shirts presented with same results.  Pt returned to supine in bed and urinal offered with no results.  Pants not donned this morning secondary to history of incontinence approx 30-45 mins after eating.  RN notified that pt remained in bed. Other than eating, pt refused to participate in self care tasks this morning.  Pt remained in bed with all needs within reach and bed alarm activated.   Therapy Documentation Precautions:  Precautions Precautions: Fall Precaution Comments: dense R hemi Restrictions Weight Bearing Restrictions: No   Pain:  Pt with no s/s of pain   Therapy/Group: Individual Therapy  Douglas [name], Douglas [name] 04/28/2018, 8:42 AM

## 2018-04-28 NOTE — Progress Notes (Signed)
[place] PHYSICAL MEDICINE & REHABILITATION PROGRESS NOTE   Subjective/Complaints: Patient seen sitting up in bed this morning.  He needs to use the restroom.  No reported issues overnight.  ROS: limited due to aphasia   Objective:   No results found. No results for input(s): WBC, HGB, HCT, PLT in the last 72 hours. No results for input(s): NA, K, CL, CO2, GLUCOSE, BUN, CREATININE, CALCIUM in the last 72 hours.  Intake/Output Summary (Last 24 hours) at 04/28/2018 0920 Last data filed at 04/28/2018 0800 Gross per 24 hour  Intake 822 ml  Output -  Net 822 ml     Physical Exam: Vital Signs Blood pressure 139/82, pulse 62, temperature 98 F (36.7 C), temperature source Oral, resp. rate 17, height 5\' 10"  (1.778 m), weight 89.1 kg, SpO2 100 %.  Constitutional: No distress . Vital signs reviewed. HENT: Normocephalic.  Atraumatic. Eyes: EOMI. No discharge. Cardiovascular: RRR.  No JVD. Respiratory: CTA bilaterally.  Normal effort. GI: BS +. Non-distended. Musc: No edema or tenderness in extremities. Neurologic:  Alert. Motor: Moving left side spontaneously, no movement noted on right side, stable Significant tone on right side, stable Global aphasia Skin: No evidence of breakdown, no evidence of rash  Assessment/Plan: 1. Functional deficits secondary to Left MCA infarct R HP and Aphasia which require 3+ hours per day of interdisciplinary therapy in a comprehensive inpatient rehab setting.  Physiatrist is providing close team supervision and 24 hour management of active medical problems listed below.  Physiatrist and rehab team continue to assess barriers to discharge/monitor patient progress toward functional and medical goals  Care Tool:  Bathing  Bathing activity did not occur: Refused(Charge nurse notified) Body parts bathed by patient: Front perineal area, Right arm, Chest, Abdomen   Body parts bathed by helper: Right arm, Buttocks, Right upper leg, Left upper  leg, Right lower leg, Left lower leg     Bathing assist Assist Level: Moderate Assistance - Patient 50 - 74%     Upper Body Dressing/Undressing Upper body dressing   What is the patient wearing?: Pull over shirt    Upper body assist Assist Level: Moderate Assistance - Patient 50 - 74%    Lower Body Dressing/Undressing Lower body dressing      What is the patient wearing?: Pants, Incontinence brief     Lower body assist Assist for lower body dressing: Maximal Assistance - Patient 25 - 49%     Toileting Toileting    Toileting assist Assist for toileting: Total Assistance - Patient < 25%     Transfers Chair/bed transfer  Transfers assist     Chair/bed transfer assist level: 2 Helpers     Locomotion Ambulation   Ambulation assist   Ambulation activity did not occur: Safety/medical concerns  Assist level: Dependent - Patient 0% Assistive device: Lite Gait Max distance: 25'   Walk 10 feet activity   Assist  Walk 10 feet activity did not occur: Safety/medical concerns  Assist level: Dependent - Patient 0% Assistive device: Lite Gait   Walk 50 feet activity   Assist Walk 50 feet with 2 turns activity did not occur: Safety/medical concerns  Assist level: Dependent - Patient 0% Assistive device: Lite Gait    Walk 150 feet activity   Assist Walk 150 feet activity did not occur: Safety/medical concerns         Walk 10 feet on uneven surface  activity   Assist Walk 10 feet on uneven surfaces activity did not occur: Safety/medical concerns  Wheelchair     Assist Will patient use wheelchair at discharge?: Yes Type of Wheelchair: Manual Wheelchair activity did not occur: Safety/medical concerns  Wheelchair assist level: Supervision/Verbal cueing Max wheelchair distance: 100'    Wheelchair 50 feet with 2 turns activity    Assist    Wheelchair 50 feet with 2 turns activity did not occur: Safety/medical  concerns   Assist Level: Supervision/Verbal cueing   Wheelchair 150 feet activity     Assist Wheelchair 150 feet activity did not occur: Safety/medical concerns   Assist Level: Total Assistance - Patient < 25%     Medical Problem List and Plan: 1.  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  DVT Prophylaxis/Anticoagulation: subcutaneous Lovenox. Monitor for any bleeding episodes 3. Pain Management:  Tylenol as needed 4. Mood: BuSpar 7.5 mg twice a day, Provide emotional support  -ritalin to improve arousal, increased to 10mg  5. Neuropsych: This patient is capable of making decisions on his own behalf. 6. Skin/Wound Care:  Routine skin checks 7. Fluids/Electrolytes/Nutrition:    Good nutritional intake on 12/28 8.Hypertension. Norvasc 10 mg daily, clonidine 0.2 mg daily, Toprol-XL 50 mg daily Vitals:   04/28/18 0354 04/28/18 0355  BP: 139/82 139/82  Pulse: 62 62  Resp: 17 17  Temp: 98 F (36.7 C) 98 F (36.7 C)  SpO2: 100% 100%   Relatively controlled on 12/20,?  Trending up 9. Dysphagia. Dysphagia #2 nectar thick liquids. Follow-up speech therapy 10. Diabetes mellitus with peripheral neuropathy. Hemoglobin A1c 7.3. Glucotrol 5 mg twice a day,Levemir 15 units twice a day. Check blood sugars before meals and at bedtime. Diabetic teaching CBG (last 3)  Recent Labs    04/27/18 1653 04/27/18 2132 04/28/18 0634  GLUCAP 291* 271* 162*   levimir   23U BID,   glucotrol at home dose of 10mg      Humalog 2 units 3 times daily started on 12/28 11. Hyperlipidemia. Lipitor 12.Suspect UTI. Urine study positive nitrites + bact .Begin empiric coverage-  Off abx 13.  Spasticity RUE and RLE --ROM with PT, OT, tizanidine stopped due to sedation, he may be a good candidate for Botox but would likely have to have this done as an outpatient  Trial baclofen on  12/26, increased on 12/28  Bracing ordered  LOS: 24 days A FACE TO FACE EVALUATION WAS PERFORMED  Lekia Nier Karis JubaAnil Jymir Dunaj 04/28/2018, 9:20 AM

## 2018-04-29 ENCOUNTER — Inpatient Hospital Stay (HOSPITAL_COMMUNITY): Payer: Commercial Indemnity

## 2018-04-29 DIAGNOSIS — R0989 Other specified symptoms and signs involving the circulatory and respiratory systems: Secondary | ICD-10-CM

## 2018-04-29 LAB — GLUCOSE, CAPILLARY
Glucose-Capillary: 162 mg/dL — ABNORMAL HIGH (ref 70–99)
Glucose-Capillary: 199 mg/dL — ABNORMAL HIGH (ref 70–99)
Glucose-Capillary: 209 mg/dL — ABNORMAL HIGH (ref 70–99)

## 2018-04-29 NOTE — Progress Notes (Signed)
[place] PHYSICAL MEDICINE & REHABILITATION PROGRESS NOTE   Subjective/Complaints: Patient seen laying in bed this AM.  No report issues.   ROS: Limited due to aphasia.   Objective:   No results found. No results for input(s): WBC, HGB, HCT, PLT in the last 72 hours. No results for input(s): NA, K, CL, CO2, GLUCOSE, BUN, CREATININE, CALCIUM in the last 72 hours.  Intake/Output Summary (Last 24 hours) at 04/29/2018 2144 Last data filed at 04/29/2018 1749 Gross per 24 hour  Intake 786 ml  Output -  Net 786 ml     Physical Exam: Vital Signs Blood pressure (!) 158/84, pulse 79, temperature 98 F (36.7 C), resp. rate 16, height 5\' 10"  (1.778 m), weight 89.1 kg, SpO2 100 %.  Constitutional: No distress . Vital signs reviewed. HENT: Normocephalic.  Atraumatic. Eyes: EOMI. No discharge. Cardiovascular: RRR. No JVD. Respiratory: CTA bilaterally. Normal effort. GI: BS +. Non-distended. Musc: No edema or tenderness in extremities. Neurologic:  Alert. Motor: Moving left side spontaneously, no movement noted on right side, unchanged Significant tone on right side, stable Global aphasia Skin: No evidence of breakdown, no evidence of rash  Assessment/Plan: 1. Functional deficits secondary to Left MCA infarct R HP and Aphasia which require 3+ hours per day of interdisciplinary therapy in a comprehensive inpatient rehab setting.  Physiatrist is providing close team supervision and 24 hour management of active medical problems listed below.  Physiatrist and rehab team continue to assess barriers to discharge/monitor patient progress toward functional and medical goals  Care Tool:  Bathing  Bathing activity did not occur: Refused(Charge nurse notified) Body parts bathed by patient: Front perineal area, Right arm, Chest, Abdomen   Body parts bathed by helper: Right arm, Buttocks, Right upper leg, Left upper leg, Right lower leg, Left lower leg     Bathing assist Assist Level:  Moderate Assistance - Patient 50 - 74%     Upper Body Dressing/Undressing Upper body dressing   What is the patient wearing?: Pull over shirt    Upper body assist Assist Level: Moderate Assistance - Patient 50 - 74%    Lower Body Dressing/Undressing Lower body dressing      What is the patient wearing?: Pants, Incontinence brief     Lower body assist Assist for lower body dressing: Maximal Assistance - Patient 25 - 49%     Toileting Toileting    Toileting assist Assist for toileting: Total Assistance - Patient < 25%     Transfers Chair/bed transfer  Transfers assist     Chair/bed transfer assist level: 2 Helpers     Locomotion Ambulation   Ambulation assist   Ambulation activity did not occur: Safety/medical concerns  Assist level: Dependent - Patient 0% Assistive device: Lite Gait Max distance: 25'   Walk 10 feet activity   Assist  Walk 10 feet activity did not occur: Safety/medical concerns  Assist level: Dependent - Patient 0% Assistive device: Lite Gait   Walk 50 feet activity   Assist Walk 50 feet with 2 turns activity did not occur: Safety/medical concerns  Assist level: Dependent - Patient 0% Assistive device: Lite Gait    Walk 150 feet activity   Assist Walk 150 feet activity did not occur: Safety/medical concerns         Walk 10 feet on uneven surface  activity   Assist Walk 10 feet on uneven surfaces activity did not occur: Safety/medical concerns         Wheelchair  Assist Will patient use wheelchair at discharge?: Yes Type of Wheelchair: Manual Wheelchair activity did not occur: Safety/medical concerns  Wheelchair assist level: Supervision/Verbal cueing Max wheelchair distance: 100'    Wheelchair 50 feet with 2 turns activity    Assist    Wheelchair 50 feet with 2 turns activity did not occur: Safety/medical concerns   Assist Level: Supervision/Verbal cueing   Wheelchair 150 feet activity      Assist Wheelchair 150 feet activity did not occur: Safety/medical concerns   Assist Level: Total Assistance - Patient < 25%     Medical Problem List and Plan: 1.  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  DVT Prophylaxis/Anticoagulation: subcutaneous Lovenox. Monitor for any bleeding episodes 3. Pain Management:  Tylenol as needed 4. Mood: BuSpar 7.5 mg twice a day, Provide emotional support  -ritalin to improve arousal, increased to 10mg  5. Neuropsych: This patient is capable of making decisions on his own behalf. 6. Skin/Wound Care:  Routine skin checks 7. Fluids/Electrolytes/Nutrition:    Good nutritional intake on 12/29 8.Hypertension. Norvasc 10 mg daily, clonidine 0.2 mg daily, Toprol-XL 50 mg daily Vitals:   04/29/18 2009 04/29/18 2015  BP: 130/70 (!) 158/84  Pulse:  79  Resp:  16  Temp:  98 F (36.7 C)  SpO2:  100%   Labile on 12/29 9. Dysphagia. Dysphagia #2 nectar thick liquids. Follow-up speech therapy 10. Diabetes mellitus with peripheral neuropathy. Hemoglobin A1c 7.3. Glucotrol 5 mg twice a day,Levemir 15 units twice a day. Check blood sugars before meals and at bedtime. Diabetic teaching CBG (last 3)  Recent Labs    04/29/18 0623 04/29/18 1203 04/29/18 1641  GLUCAP 162* 199* 209*   levimir   23U BID,   glucotrol at home dose of 10mg      Humalog 2 units 3 times daily started on 12/28  Cont to monitor, consider further increase tomorrow 11. Hyperlipidemia. Lipitor 12.Suspect UTI. Urine study positive nitrites + bact .Begin empiric coverage-  Off abx 13.  Spasticity RUE and RLE --ROM with PT, OT, tizanidine stopped due to sedation, he may be a good candidate for Botox but would likely have to have this done as an outpatient  Trial baclofen on 12/26, increased on 12/28  Bracing ordered  LOS: 25 days A FACE TO FACE  EVALUATION WAS PERFORMED  Douglas [name] 04/29/2018, 9:44 PM

## 2018-04-29 NOTE — Progress Notes (Signed)
Occupational Therapy Session Note  Patient Details  Name: Douglas [name] MRN: [id number] Date of Birth: [birth year]  Today's Date: 04/29/2018 OT Individual Time: 3744-5146 OT Individual Time Calculation (min): 29 min     Skilled Therapeutic Interventions/Progress Updates:    1:1. Focus of session on R attention, and hemi dressing. Pt selects preferred clothing with MOD VC for scanning to locate options in R visual field. Pt presented with wash cloth to wash UB on R and pt washes chest and stomach and face. Attempt HOH A to wash RUE with LUE but pt tosses rag back at therapist. Total A provided to wash B arms/buttocks. Pt washes peri area. Pt dons shirt with MAX A overall at bed level as pt unwilling to wait for OT to provide A to thread RUE. Total A to don LB clothing and max A of 1 to roll to advance pants past hips with manual placement of LUE on R bedrail to facilitate rolling. Exited session with pt seated in bed, call light in reach exit alram on and all needs met  Therapy Documentation Precautions:  Precautions Precautions: Fall Precaution Comments: dense R hemi Restrictions Weight Bearing Restrictions: No General:   Vital Signs:   Pain: Pain Assessment Pain Scale: Faces Faces Pain Scale: No hurt ADL:   Therapy/Group: Individual Therapy  [name] 04/29/2018, 10:46 AM

## 2018-04-29 NOTE — Progress Notes (Signed)
Good night, calm and cooperative. Increase tone to right side. Incontinent of urine, doesn't call when wet. Alfredo MartinezMurray, Miriam Kestler A

## 2018-04-30 ENCOUNTER — Inpatient Hospital Stay (HOSPITAL_COMMUNITY): Payer: Commercial Indemnity

## 2018-04-30 ENCOUNTER — Inpatient Hospital Stay (HOSPITAL_COMMUNITY): Payer: Commercial Indemnity | Admitting: Speech Pathology

## 2018-04-30 ENCOUNTER — Inpatient Hospital Stay (HOSPITAL_COMMUNITY): Payer: Commercial Indemnity | Admitting: *Deleted

## 2018-04-30 LAB — GLUCOSE, CAPILLARY
Glucose-Capillary: 232 mg/dL — ABNORMAL HIGH (ref 70–99)
Glucose-Capillary: 141 mg/dL — ABNORMAL HIGH (ref 70–99)
Glucose-Capillary: 196 mg/dL — ABNORMAL HIGH (ref 70–99)
Glucose-Capillary: 160 mg/dL — ABNORMAL HIGH (ref 70–99)
Glucose-Capillary: 204 mg/dL — ABNORMAL HIGH (ref 70–99)
Glucose-Capillary: 224 mg/dL — ABNORMAL HIGH (ref 70–99)

## 2018-04-30 MED ORDER — CLONIDINE HCL 0.2 MG PO TABS: 0.2000 mg | ORAL_TABLET | Freq: Two times a day (BID) | ORAL | 6 refills | Status: DC

## 2018-04-30 MED ORDER — BACLOFEN 10 MG PO TABS: 10.0000 mg | ORAL_TABLET | Freq: Two times a day (BID) | ORAL | 0 refills | Status: DC

## 2018-04-30 MED ORDER — METOPROLOL SUCCINATE ER 50 MG PO TB24: 50.0000 mg | ORAL_TABLET | Freq: Every day | ORAL | 0 refills | Status: DC

## 2018-04-30 MED ORDER — PANTOPRAZOLE SODIUM 40 MG PO TBEC: 40.0000 mg | DELAYED_RELEASE_TABLET | Freq: Every day | ORAL | 0 refills | Status: DC

## 2018-04-30 MED ORDER — INSULIN DETEMIR 100 UNIT/ML FLEXPEN: 23.0000 [IU] | PEN_INJECTOR | Freq: Every day | SUBCUTANEOUS | 11 refills | Status: DC

## 2018-04-30 MED ORDER — INSULIN ASPART 100 UNIT/ML ~~LOC~~ SOLN
3.0000 [IU] | Freq: Three times a day (TID) | SUBCUTANEOUS | Status: DC
  Administered 2018-04-30 – 2018-05-01 (×3): 3 [IU] via SUBCUTANEOUS

## 2018-04-30 MED ORDER — ATORVASTATIN CALCIUM 20 MG PO TABS: 20.0000 mg | ORAL_TABLET | Freq: Every day | ORAL | 3 refills | Status: DC

## 2018-04-30 MED ORDER — HYDRALAZINE HCL 50 MG PO TABS: 50.0000 mg | ORAL_TABLET | Freq: Three times a day (TID) | ORAL | 0 refills | Status: DC

## 2018-04-30 MED ORDER — METHYLPHENIDATE HCL 10 MG PO TABS: 10.0000 mg | ORAL_TABLET | Freq: Two times a day (BID) | ORAL | 0 refills | Status: DC

## 2018-04-30 MED ORDER — AMLODIPINE BESYLATE 10 MG PO TABS: 10.0000 mg | ORAL_TABLET | Freq: Every day | ORAL | 3 refills | Status: DC

## 2018-04-30 MED ORDER — GLIPIZIDE 10 MG PO TABS: 10.0000 mg | ORAL_TABLET | Freq: Two times a day (BID) | ORAL | 3 refills | Status: DC

## 2018-04-30 MED ORDER — CLOPIDOGREL BISULFATE 75 MG PO TABS: 75.0000 mg | ORAL_TABLET | Freq: Every day | ORAL | 0 refills | Status: DC

## 2018-04-30 NOTE — Progress Notes (Signed)
Occupational Therapy Session Note  Patient Details  Name: Douglas [name] MRN: [id number] Date of Birth: [birth year]  Today's Date: 04/30/2018 OT Individual Time: 0700-0755 OT Individual Time Calculation (min): 55 min    Short Term Goals: Week 4:  OT Short Term Goal 1 (Week 4): STG=LTG secondary to ELOS (LTG downgraded)  Skilled Therapeutic Interventions/Progress Updates:    Pt resting in bed upon arrival.  Attempted to engage pt in dressing tasks.  Attempted to pull down covers to assist with donning pants but pt persistently pulled covers back up.  Presented urinal to pt but pt persistently pushed urinal towards therapist.  Pt presented with breakfast tray and completed eating breakfast with supervision after setup.  Upon completion of breakfast pt indicated he wanted to use urinal.  Brief loosened and pt placed urinal but was unsuccessful after approx 10 mins.  Pt placed urinal on bed rail and after encouragement, pt allowed therapist to refasten brief.  Pt continued to refuse to engage in dressing tasks.  Nursing staff notified. Pt remained in bed with all needs within reach and bed alarm activated.   Therapy Documentation Precautions:  Precautions Precautions: Fall Precaution Comments: dense R hemi Restrictions Weight Bearing Restrictions: No  Pain:  Pt denies pain   Therapy/Group: Individual Therapy  Douglas [name], Douglas [name] 04/30/2018, 7:57 AM

## 2018-04-30 NOTE — NC FL2 (Signed)
[place] MEDICAID FL2 LEVEL OF CARE SCREENING TOOL     IDENTIFICATION  Patient Name: Douglas [name] Birthdate: [birth year] Sex: male Admission Date (Current Location): 04/04/2018  [facility] [place]   Facility and Address:  [facility] [place], [address], [place], [place] [address]      Provider Number: [id number]  Attending Physician Name and Address:  Erick ColaceKirsteins, Andrew E, MD  Relative Name and Phone Number:  Heide ScalesKim Macqueen-daughter [phone]    Current Level of Care: Other (Comment)(Rehab) Recommended Level of Care: Skilled Nursing Facility Prior Approval Number:    Date Approved/Denied:   PASRR Number:    Discharge Plan: SNF    Current Diagnoses: Patient Active Problem List   Diagnosis Date Noted  . Labile blood pressure   . 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 Labile blood glucose   . Global aphasia   . Cerebral edema (HCC) 04/04/2018  . Renal insufficiency 04/04/2018  . 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 Acute lower UTI   . Type 2 diabetes mellitus with peripheral neuropathy (HCC)   . Acute embolic stroke (HCC)   . Diabetes 1.5, managed as type 2 (HCC)   . Essential hypertension   . Dyslipidemia   . History of CVA with residual deficit   . Dysphagia, post-stroke     Orientation RESPIRATION BLADDER Height & Weight     Self, Place, Situation  Normal Incontinent Weight: 196 lb 6.9 oz (89.1 kg) Height:  5\' 10"  (177.8 cm)  BEHAVIORAL SYMPTOMS/MOOD NEUROLOGICAL BOWEL NUTRITION STATUS      Incontinent Diet(Dys 3 nectar thick liquids)  AMBULATORY STATUS COMMUNICATION OF NEEDS Skin   Total Care Non-Verbally Normal                       Personal Care Assistance Level of Assistance  Bathing, Feeding, Dressing Bathing Assistance: Maximum assistance Feeding assistance: Limited assistance Dressing Assistance: Maximum assistance      Functional Limitations Info  Speech, Sight Sight Info: Adequate   Speech Info: Impaired    SPECIAL CARE FACTORS FREQUENCY  PT (By licensed PT), OT (By licensed OT), Bowel and bladder program, Speech therapy     PT Frequency: 5x week OT Frequency: 5x week Bowel and Bladder Program Frequency: timed toileting every 2-3 hours to assist with continence   Speech Therapy Frequency: 5 x week      Contractures Contractures Info: Not present    Additional Factors Info  Code Status, Allergies Code Status Info: Full Code Allergies Info: No Known Allergies           Current Medications (04/30/2018):  This is the current hospital active medication list Current Facility-Administered Medications  Medication Dose Route Frequency Provider Last Rate Last Dose  . acetaminophen (TYLENOL) tablet 650 mg  650 mg Oral Q4H PRN Charlton Amorngiulli, Daniel J, PA-C   650 mg at 04/06/18 2049   Or  . acetaminophen (TYLENOL) solution 650 mg  650 mg Per Tube Q4H PRN Angiulli, Mcarthur Rossettianiel J, PA-C       Or  . acetaminophen (TYLENOL) suppository 650 mg  650 mg Rectal Q4H PRN Angiulli, Mcarthur Rossettianiel J, PA-C      . amLODipine (NORVASC) tablet 10 mg  10 mg Oral Daily AngiulliMcarthur Rossetti, Daniel J, PA-C   10 mg at 04/30/18 1006  . aspirin EC tablet 325 mg  325 mg Oral Daily Mariam DollarAngiulli, Daniel  J, PA-C   325 mg at 04/30/18 1005  . atorvastatin (LIPITOR) tablet 20 mg  20 mg Oral QHS Charlton Amorngiulli, Daniel J, PA-C   20 mg at 04/29/18 2008  . baclofen (LIORESAL) tablet 10 mg  10 mg Oral BID Marcello FennelPatel, Ankit Anil, MD   10 mg at 04/30/18 1005  . cloNIDine (CATAPRES) tablet 0.2 mg  0.2 mg Oral BID Charlton Amorngiulli, Daniel J, PA-C   0.2 mg at 04/30/18 1005  . clopidogrel (PLAVIX) tablet 75 mg  75 mg Oral Daily Charlton Amorngiulli, Daniel J, PA-C   75 mg at 04/30/18 1006  . enoxaparin (LOVENOX) injection 40 mg  40 mg Subcutaneous Q24H AngiulliMcarthur Rossetti, Daniel J, PA-C   40 mg at 04/30/18 1020  . glipiZIDE (GLUCOTROL) tablet 10 mg  10 mg Oral BID AC Kirsteins, Victorino SparrowAndrew E, MD   10 mg at 04/30/18  [id number]  . hydrALAZINE (APRESOLINE) tablet 50 mg  50 mg Oral TID Charlton AmorAngiulli, Daniel J, PA-C   50 mg at 04/30/18 1006  . insulin aspart (novoLOG) injection 0-15 Units  0-15 Units Subcutaneous TID WC Charlton Amorngiulli, Daniel J, PA-C   3 Units at 04/30/18 1238  . insulin aspart (novoLOG) injection 3 Units  3 Units Subcutaneous TID WC Marcello FennelPatel, Ankit Anil, MD   3 Units at 04/30/18 1237  . insulin detemir (LEVEMIR) injection 23 Units  23 Units Subcutaneous BID Erick ColaceKirsteins, Andrew E, MD   23 Units at 04/30/18 1237  . MEDLINE mouth rinse  15 mL Mouth Rinse BID Kirsteins, Victorino SparrowAndrew E, MD   15 mL at 04/30/18 1025  . methylphenidate (RITALIN) tablet 10 mg  10 mg Oral BID WC Ranelle OysterSwartz, Zachary T, MD   10 mg at 04/30/18 1006  . metoprolol succinate (TOPROL-XL) 24 hr tablet 50 mg  50 mg Oral Daily AngiulliMcarthur Rossetti, Daniel J, PA-C   50 mg at 04/30/18 1006  . pantoprazole (PROTONIX) EC tablet 40 mg  40 mg Oral Daily Charlton Amorngiulli, Daniel J, PA-C   40 mg at 04/30/18 1006  . RESOURCE THICKENUP CLEAR   Oral PRN Angiulli, Mcarthur RossettiDaniel J, PA-C      . sorbitol 70 % solution 30 mL  30 mL Oral Daily PRN Angiulli, Mcarthur Rossettianiel J, PA-C         Discharge Medications: Please see discharge summary for a list of discharge medications.  Relevant Imaging Results:  Relevant Lab Results:   Additional Information SSN: [id number]  Shamica Moree, Lemar Livingsebecca G, LCSW

## 2018-04-30 NOTE — Progress Notes (Signed)
Speech Language Pathology Daily Session Note  Patient Details  Name: Douglas [name] MRN: [id number] Date of Birth: February 25, 1951  Today's Date: 04/30/2018 SLP Individual Time: 1345-1430 SLP Individual Time Calculation (min): 45 min  Short Term Goals: Week 4: SLP Short Term Goal 1 (Week 4): STGs=LTGs  Skilled Therapeutic Interventions: Skilled treatment session focused on patient and family education. SLP facilitated session by providing education to the patient and his daughter in regards to patient's current swallowing function, diet recommendations, appropriate textures, swallowing compensatory strategies and medication administration. SLP also educated on how to appropriately thicken liquids and where to buy thickened liquids online if she chooses to do so. Patient's daughter verbalized and demonstrated understanding of all information. Patient left upright in wheelchair with alarm on and daughter present. Continue with current plan of care.      Pain Pain Assessment Pain Scale: 0-10 Pain Score: 0-No pain  Therapy/Group: Individual Therapy  Adalyne Lovick 04/30/2018, 3:13 PM

## 2018-04-30 NOTE — Progress Notes (Signed)
Incontinent of urine 3 times last night. ? Try condom cath to manage incontinence. Flat affect. Douglas MartinezMurray, Kiing Deakin A

## 2018-04-30 NOTE — Progress Notes (Signed)
Occupational Therapy Session Note  Patient Details  Name: Ashok CordiaWilliam L Hillery Jr MRN: [id number] Date of Birth: 08/21/1950  Today's Date: 04/30/2018 OT Individual Time: 1300-1330 OT Individual Time Calculation (min): 30 min    Short Term Goals: Week 4:  OT Short Term Goal 1 (Week 4): STG=LTG secondary to ELOS (LTG downgraded)  Skilled Therapeutic Interventions/Progress Updates:    Pt resting in bed upon arrival.  Pt incontinent of bladder.  Pt required tot A +2 to change pants and perform hygiene.  Pt required max A for supine>sit EOB to change shirt and prepare for squat pivot transfer to w/c.  Pt require CGA for sitting balance when donning shirt.  Pt initiates threading LUE and pulling over head but requires assistance to complete tasks.  Pt performed squat pivot tranfser with max A and max multimodal cues for sequencing and safety.  Pt remained in w/c with belt alarm activated, all needs within reach, and daughter present.   Therapy Documentation Precautions:  Precautions Precautions: Fall Precaution Comments: dense R hemi Restrictions Weight Bearing Restrictions: No Pain:   Therapy/Group: Individual Therapy  Rich BraveLanier, Nikolaos Maddocks Chappell 04/30/2018, 2:42 PM

## 2018-04-30 NOTE — Progress Notes (Signed)
[facility] PHYSICAL MEDICINE & REHABILITATION PROGRESS NOTE   Subjective/Complaints: Patient seen sitting up in bed this morning.  No reported issues overnight.  ROS: Limited due to aphasia.   Objective:   No results found. No results for input(s): WBC, HGB, HCT, PLT in the last 72 hours. No results for input(s): NA, K, CL, CO2, GLUCOSE, BUN, CREATININE, CALCIUM in the last 72 hours.  Intake/Output Summary (Last 24 hours) at 04/30/2018 0953 Last data filed at 04/30/2018 0831 Gross per 24 hour  Intake 804 ml  Output -  Net 804 ml     Physical Exam: Vital Signs Blood pressure (!) 157/80, pulse 62, temperature (!) 97.5 F (36.4 C), temperature source Oral, resp. rate 17, height 5\' 10"  (1.778 m), weight 89.1 kg, SpO2 100 %.  Constitutional: No distress . Vital signs reviewed. HENT: Normocephalic.  Atraumatic. Eyes: EOMI. No discharge. Cardiovascular: RRR.  No JVD. Respiratory: CTA bilaterally.  Normal effort. GI: BS +. Non-distended. Musc: No edema or tenderness in extremities. Neurologic:  Alert. Motor: Moving left side spontaneously, no movement noted on right side, stable Significant tone on right side, unchanged Global aphasia Skin: No evidence of breakdown, no evidence of rash  Assessment/Plan: 1. Functional deficits secondary to Left MCA infarct R HP and Aphasia which require 3+ hours per day of interdisciplinary therapy in a comprehensive inpatient rehab setting.  Physiatrist is providing close team supervision and 24 hour management of active medical problems listed below.  Physiatrist and rehab team continue to assess barriers to discharge/monitor patient progress toward functional and medical goals  Care Tool:  Bathing  Bathing activity did not occur: Refused(Charge nurse notified) Body parts bathed by patient: Front perineal area, Right arm, Chest, Abdomen   Body parts bathed by helper: Right arm, Buttocks, Right upper leg, Left upper leg, Right lower  leg, Left lower leg     Bathing assist Assist Level: Moderate Assistance - Patient 50 - 74%     Upper Body Dressing/Undressing Upper body dressing   What is the patient wearing?: Pull over shirt    Upper body assist Assist Level: Moderate Assistance - Patient 50 - 74%    Lower Body Dressing/Undressing Lower body dressing      What is the patient wearing?: Pants, Incontinence brief     Lower body assist Assist for lower body dressing: Maximal Assistance - Patient 25 - 49%     Toileting Toileting    Toileting assist Assist for toileting: Total Assistance - Patient < 25%     Transfers Chair/bed transfer  Transfers assist     Chair/bed transfer assist level: 2 Helpers     Locomotion Ambulation   Ambulation assist   Ambulation activity did not occur: Safety/medical concerns  Assist level: Dependent - Patient 0% Assistive device: Lite Gait Max distance: 25'   Walk 10 feet activity   Assist  Walk 10 feet activity did not occur: Safety/medical concerns  Assist level: Dependent - Patient 0% Assistive device: Lite Gait   Walk 50 feet activity   Assist Walk 50 feet with 2 turns activity did not occur: Safety/medical concerns  Assist level: Dependent - Patient 0% Assistive device: Lite Gait    Walk 150 feet activity   Assist Walk 150 feet activity did not occur: Safety/medical concerns         Walk 10 feet on uneven surface  activity   Assist Walk 10 feet on uneven surfaces activity did not occur: Safety/medical concerns  Wheelchair     Assist Will patient use wheelchair at discharge?: Yes Type of Wheelchair: Manual Wheelchair activity did not occur: Safety/medical concerns  Wheelchair assist level: Supervision/Verbal cueing Max wheelchair distance: 100'    Wheelchair 50 feet with 2 turns activity    Assist    Wheelchair 50 feet with 2 turns activity did not occur: Safety/medical concerns   Assist Level:  Supervision/Verbal cueing   Wheelchair 150 feet activity     Assist Wheelchair 150 feet activity did not occur: Safety/medical concerns   Assist Level: Total Assistance - Patient < 25%     Medical Problem List and Plan: 1.  Right side weakness with aphasia/dysphagia secondary to large left MCA infarction 03/30/18 as well as history of CVA 2013.aspirin and Plavix 3 months  Continue CIR   Plan for DC home on 1/1 2.  DVT Prophylaxis/Anticoagulation: subcutaneous Lovenox. Monitor for any bleeding episodes 3. Pain Management:  Tylenol as needed 4. Mood: BuSpar 7.5 mg twice a day, Provide emotional support  -ritalin to improve arousal, increased to 10mg  5. Neuropsych: This patient is capable of making decisions on his own behalf. 6. Skin/Wound Care:  Routine skin checks 7. Fluids/Electrolytes/Nutrition:    Good nutritional intake on 12/30 8.Hypertension. Norvasc 10 mg daily, clonidine 0.2 mg daily, Toprol-XL 50 mg daily Vitals:   04/29/18 2015 04/30/18 0333  BP: (!) 158/84 (!) 157/80  Pulse: 79 62  Resp: 16 17  Temp: 98 F (36.7 C) (!) 97.5 F (36.4 C)  SpO2: 100% 100%   Labile on 12/30 9. Dysphagia. Dysphagia #2 nectar thick liquids. Follow-up speech therapy 10. Diabetes mellitus with peripheral neuropathy. Hemoglobin A1c 7.3. Glucotrol 5 mg twice a day,Levemir 15 units twice a day. Check blood sugars before meals and at bedtime. Diabetic teaching CBG (last 3)  Recent Labs    04/29/18 1641 04/29/18 2113 04/30/18 0616  GLUCAP 209* 232* 141*   levimir   23U BID,   glucotrol at home dose of 10mg      Humalog 2 units 3 times daily started on 12/28, increased to 3 units on 12/30 11. Hyperlipidemia. Lipitor 12.Suspect UTI. Urine study positive nitrites + bact .Begin empiric coverage-  Off abx 13.  Spasticity RUE and RLE --ROM with PT, OT, tizanidine stopped due to sedation, he may be a good candidate for Botox but would likely have to have this done as an outpatient  Trial  baclofen on 12/26, increased on 12/28  Bracing ordered  LOS: 26 days A FACE TO FACE EVALUATION WAS PERFORMED  Gayle Martinez Karis Jubanil Saniah Schroeter 04/30/2018, 9:53 AM

## 2018-04-30 NOTE — Progress Notes (Addendum)
Social Work Patient ID: Douglas CordiaWilliam L Wilkinson Jr, male   DOB: 12/08/1950, 67 y.o.   MRN: [id number] Spoke with both daughter's one via conference call to discuss discharge plans and amount of care pt requires. Now considering NH[facility] and [facility] Daughter aware [facility] or could look here locally but all feel [facility] [place] is [facility] family members. Both daughter's to [facility] and give worker top choices so can begin pursuing bed offer. [name] to talk with pt regarding this option. Seems [name] realizes the amount of care pt requires he is plus two assist.

## 2018-04-30 NOTE — Progress Notes (Signed)
Physical Therapy Session Note  Patient Details  Name: Douglas [name] MRN: [id number] Date of Birth: 01/15/1951  Today's Date: 04/30/2018 PT Individual Time: [id number] PT Individual Time Calculation (min): 73 min   Short Term Goals: Week 4:  PT Short Term Goal 1 (Week 4): =LTGs due to ELOS  Skilled Therapeutic Interventions/Progress Updates:    Pt seated in TIS w/c upon PT arrival, agreeable to therapy tx and denies pain. Pt transported to the gym. Pt performed slideboard transfer to mat with max assist this session, increased time given for motor planning and sequencing. Pt seated edge of mat worked on sitting balance, reaching outside BOS and weightshifting to performed reaching activity with cones, x 3 trials. Pt performed slideboard transfer to the L with mod assist and then back to the mat to R with max assist. Pt transferred to sidelying on elbow with mod assist, in this position performed reaching task with L UE keeping weight through R UE for increased weightbearing. Pt performed x 2 sit<>stands this session 3 musketeers, manual facilitation for increased hip extension and blocking R knee, mirror for visual feedback. X 2 sit<>stands from elevated mat within stedy max assist, in standing working on midline and upright posture while performing reaching task for cones with L UE, mirror for visual feedback. Pt transferred back to w/c with stedy max assist +2 and transported back to room, left with needs in reach and daughter present. Discussed d/c plan, [place]   Therapy Documentation Precautions:  Precautions Precautions: Fall Precaution Comments: dense R hemi Restrictions Weight Bearing Restrictions: No    Therapy/Group: Individual Therapy  Cresenciano GenreEmily van Schagen, PT, DPT 04/30/2018, 12:07 PM

## 2018-05-01 ENCOUNTER — Inpatient Hospital Stay (HOSPITAL_COMMUNITY): Payer: Commercial Indemnity | Admitting: Physical Therapy

## 2018-05-01 ENCOUNTER — Inpatient Hospital Stay (HOSPITAL_COMMUNITY): Payer: Commercial Indemnity | Admitting: Speech Pathology

## 2018-05-01 ENCOUNTER — Inpatient Hospital Stay (HOSPITAL_COMMUNITY): Payer: Commercial Indemnity | Admitting: Occupational Therapy

## 2018-05-01 LAB — GLUCOSE, CAPILLARY
Glucose-Capillary: 187 mg/dL — ABNORMAL HIGH (ref 70–99)
Glucose-Capillary: 197 mg/dL — ABNORMAL HIGH (ref 70–99)
Glucose-Capillary: 182 mg/dL — ABNORMAL HIGH (ref 70–99)

## 2018-05-01 MED ORDER — INSULIN ASPART 100 UNIT/ML ~~LOC~~ SOLN
5.0000 [IU] | Freq: Three times a day (TID) | SUBCUTANEOUS | Status: DC
  Administered 2018-05-01 – 2018-05-03 (×6): 5 [IU] via SUBCUTANEOUS

## 2018-05-01 MED ORDER — BACLOFEN 20 MG PO TABS
20.0000 mg | ORAL_TABLET | Freq: Two times a day (BID) | ORAL | Status: DC
  Administered 2018-05-01 – 2018-05-04 (×6): 20 mg via ORAL
  Filled 2018-05-01 (×6): qty 1

## 2018-05-01 NOTE — Plan of Care (Signed)
  Problem: Consults Goal: RH STROKE PATIENT EDUCATION Description See Patient Education module for education specifics  Outcome: Progressing   Problem: RH BOWEL ELIMINATION Goal: RH STG MANAGE BOWEL WITH ASSISTANCE Description STG Manage Bowel with min Assistance.  Outcome: Progressing Goal: RH STG MANAGE BOWEL W/MEDICATION W/ASSISTANCE Description STG Manage Bowel with Medication with mod I Assistance.  Outcome: Progressing   Problem: RH BLADDER ELIMINATION Goal: RH STG MANAGE BLADDER WITH ASSISTANCE Description STG Manage Bladder With  Min Assistance  Outcome: Progressing   Problem: RH SAFETY Goal: RH STG ADHERE TO SAFETY PRECAUTIONS W/ASSISTANCE/DEVICE Description STG Adhere to Safety Precautions With cues/reminders supervision/Assistance/Device.  Outcome: Progressing   Problem: RH KNOWLEDGE DEFICIT Goal: RH STG INCREASE KNOWLEDGE OF DIABETES Description Daughter to be able to explain management of dm using medications na d dietary restrictions independently using handouts and resources. Dtr will be able to administer insulin independently  Outcome: Progressing Goal: RH STG INCREASE KNOWLEDGE OF HYPERTENSION Description Daughter to be able to explain management of HTN using medications na d dietary restrictions independently using handouts and resources  Outcome: Progressing Goal: RH STG INCREASE KNOWLEDGE OF DYSPHAGIA/FLUID INTAKE Description Dtr and pt will be able to demonstrate understanding of dysphagia diet restrictions and medication administration to avoid aspiration independently using handouts/resources  Outcome: Progressing Goal: RH STG INCREASE KNOWLEGDE OF HYPERLIPIDEMIA Description Daughter to be able to explain management of HLD using medications and dietary restrictions independently using handouts and resources  Outcome: Progressing Goal: RH STG INCREASE KNOWLEDGE OF STROKE PROPHYLAXIS Description Daughter to be able to explain management of  secondary stroke prevention using medications and dietary restrictions independently using handouts and resources  Outcome: Progressing   

## 2018-05-01 NOTE — Progress Notes (Signed)
Occupational Therapy Session Note  Patient Details  Name: Douglas [name] MRN: [id number] Date of Birth: 1950-10-11  Today's Date: 05/01/2018 OT Individual Time:  -       Short Term Goals: Week 4:  OT Short Term Goal 1 (Week 4): STG=LTG secondary to ELOS (LTG downgraded)  Skilled Therapeutic Interventions/Progress Updates:    OT interventin with focus on functional transfers, bathing at shower level, and dressing with sit<>stand from w/c.  Pt required max A+2 for squat pivot transfer w/c<>BSC in shower.  Pt able to maintain sitting balance in shower at supervision level.  Pt initiated bathing torso, perineal area, RUE, and face.  Pt required mod demonstrational cues to bathe B upper legs. Pt initiated donning shirt but required assistance completing tasks.  Pt required max A for sit<>stand for 2nd therapist to bathe buttocks and pull pants over hips. Pt remains nonverbal and requires max multimodal cues to initiate tasks. Pt remained seated in standard w/c with belt alarm activated and all needs within reach.  [name] present.   Therapy Documentation Precautions:  Precautions Precautions: Fall Precaution Comments: dense R hemi Restrictions Weight Bearing Restrictions: No   Pain:  Pt with no s/s of pain   Therapy/Group: Individual Therapy  Rich BraveLanier, Deronte Solis Chappell 05/01/2018, 1:12 PM

## 2018-05-01 NOTE — Progress Notes (Signed)
Physical Therapy Session Note  Patient Details  Name: Douglas [name] MRN: [id number] Date of Birth: 14-Apr-1951  Today's Date: 05/01/2018 PT Individual Time: 0900-0930 AND 1545-1650 PT Individual Time Calculation (min): 30 min AND 65 min  Short Term Goals: Week 4:  PT Short Term Goal 1 (Week 4): =LTGs due to ELOS  Skilled Therapeutic Interventions/Progress Updates:   Session 1:  Pt in supine and agreeable to therapy, no c/o or evidence of pain throughout session. Mod-max assist transfer to EOB, pt able to initiate w/o prompting from therapist. Stand pivot transfer to w/c (on L side) w/ max assist x2 helpers. Manual and verbal cues for forward lean. 2nd helper providing verbal and manual cues to prevent LUE from pushing, 2nd helper guiding hips to w/c. Total assist w/c transport to/from therapy gym, practiced stand pivot transfer to/from mat in other direction. Unable to un-weight bottom enough to perform pivot towards R side, made multiple attempts. Returned to room and ended session reclined in TIS, all needs in reach.   Session 2:  Pt in supine and agreeable to therapy, no c/o pain throughout session. Brief noted to be soiled. Roll R/L w/ max assist while 2nd helper performed brief management and pericare. Donned new pants, total assist however pt able to initiate supine bridge to bring pants over hips. Max assist transfer to EOB and max assist x2 stand pivot to w/c. Total assist w/c transport to/from outside of hospital to practice real car transfer in daughter's car. Performed stand pivot to/from car x2 reps, max assist x2. Daughter's boyfriend performed 2nd transfer safely w/ only a few verbal cues for technique from therapist. Educated both daughter and daughter's boyfriend on importance of setting up transfer correctly and need to have 2nd helper providing close stand-by assist to watch for safety concerns. Educated both on how to eliminate pushing tendencies during transfer as well.  Daughter's boyfriend feels confident w/ performing car transfer. Returned to unit via w/c and performed kinetron @ 50 cm/sec, 3 min x2 bouts w/ min tactile cues for R quad and HS activation. Returned to room and performed stand pivot transfer back to EOB, max assist x1. Ended session in supine, all needs in reach.   Therapy Documentation Precautions:  Precautions Precautions: Fall Precaution Comments: dense R hemi Restrictions Weight Bearing Restrictions: No Vital Signs: Therapy Vitals Temp: (!) 97.4 F (36.3 C) Temp Source: Oral Pulse Rate: 70 Resp: 15 BP: 134/87 Patient Position (if appropriate): Lying Oxygen Therapy SpO2: 100 % O2 Device: Room Air  Therapy/Group: Individual Therapy  Douglas [name] 05/01/2018, 9:34 AM

## 2018-05-01 NOTE — Progress Notes (Signed)
[facility] PHYSICAL MEDICINE & REHABILITATION PROGRESS NOTE   Subjective/Complaints: Patient seen working with therapies this morning.  No reported issues overnight.  ROS: Limited due to aphasia.   Objective:   No results found. No results for input(s): WBC, HGB, HCT, PLT in the last 72 hours. No results for input(s): NA, K, CL, CO2, GLUCOSE, BUN, CREATININE, CALCIUM in the last 72 hours.  Intake/Output Summary (Last 24 hours) at 05/01/2018 0850 Last data filed at 04/30/2018 1805 Gross per 24 hour  Intake 480 ml  Output -  Net 480 ml     Physical Exam: Vital Signs Blood pressure 134/87, pulse 70, temperature (!) 97.4 F (36.3 C), temperature source Oral, resp. rate 15, height 5\' 10"  (1.778 m), weight 89.1 kg, SpO2 100 %.  Constitutional: No distress . Vital signs reviewed. HENT: Normocephalic.  Atraumatic. Eyes: EOMI. No discharge. Cardiovascular: RRR.  No JVD. Respiratory: CTA bilaterally.  Normal effort. GI: BS +. Non-distended. Musc: No edema or tenderness in extremities. Neurologic:  Alert. Motor: Moving left side spontaneously, no movement noted on right side, unchanged Significant tone on right side, unchanged Global aphasia Skin: No evidence of breakdown, no evidence of rash  Assessment/Plan: 1. Functional deficits secondary to Left MCA infarct R HP and Aphasia which require 3+ hours per day of interdisciplinary therapy in a comprehensive inpatient rehab setting.  Physiatrist is providing close team supervision and 24 hour management of active medical problems listed below.  Physiatrist and rehab team continue to assess barriers to discharge/monitor patient progress toward functional and medical goals  Care Tool:  Bathing  Bathing activity did not occur: Refused(Charge nurse notified) Body parts bathed by patient: Front perineal area, Right arm, Chest, Abdomen   Body parts bathed by helper: Right arm, Buttocks, Right upper leg, Left upper leg, Right  lower leg, Left lower leg     Bathing assist Assist Level: Moderate Assistance - Patient 50 - 74%     Upper Body Dressing/Undressing Upper body dressing   What is the patient wearing?: Pull over shirt    Upper body assist Assist Level: Moderate Assistance - Patient 50 - 74%    Lower Body Dressing/Undressing Lower body dressing      What is the patient wearing?: Pants, Incontinence brief     Lower body assist Assist for lower body dressing: Maximal Assistance - Patient 25 - 49%     Toileting Toileting    Toileting assist Assist for toileting: Total Assistance - Patient < 25%     Transfers Chair/bed transfer  Transfers assist     Chair/bed transfer assist level: 2 Helpers     Locomotion Ambulation   Ambulation assist   Ambulation activity did not occur: Safety/medical concerns  Assist level: Dependent - Patient 0% Assistive device: Lite Gait Max distance: 25'   Walk 10 feet activity   Assist  Walk 10 feet activity did not occur: Safety/medical concerns  Assist level: Dependent - Patient 0% Assistive device: Lite Gait   Walk 50 feet activity   Assist Walk 50 feet with 2 turns activity did not occur: Safety/medical concerns  Assist level: Dependent - Patient 0% Assistive device: Lite Gait    Walk 150 feet activity   Assist Walk 150 feet activity did not occur: Safety/medical concerns         Walk 10 feet on uneven surface  activity   Assist Walk 10 feet on uneven surfaces activity did not occur: Safety/medical concerns  Wheelchair     Assist Will patient use wheelchair at discharge?: Yes Type of Wheelchair: Manual Wheelchair activity did not occur: Safety/medical concerns  Wheelchair assist level: Supervision/Verbal cueing Max wheelchair distance: 100'    Wheelchair 50 feet with 2 turns activity    Assist    Wheelchair 50 feet with 2 turns activity did not occur: Safety/medical concerns   Assist Level:  Supervision/Verbal cueing   Wheelchair 150 feet activity     Assist Wheelchair 150 feet activity did not occur: Safety/medical concerns   Assist Level: Total Assistance - Patient < 25%     Medical Problem List and Plan: 1.  Right side weakness with aphasia/dysphagia secondary to large left MCA infarction 03/30/18 as well as history of CVA 2013.aspirin and Plavix 3 months  Continue CIR   Plan for DC to SNF 2.  DVT Prophylaxis/Anticoagulation: subcutaneous Lovenox. Monitor for any bleeding episodes 3. Pain Management:  Tylenol as needed 4. Mood: BuSpar 7.5 mg twice a day, Provide emotional support  -ritalin to improve arousal, increased to 10mg  5. Neuropsych: This patient is capable of making decisions on his own behalf. 6. Skin/Wound Care:  Routine skin checks 7. Fluids/Electrolytes/Nutrition:    Good nutritional intake on 12/30 8.Hypertension. Norvasc 10 mg daily, clonidine 0.2 mg daily, Toprol-XL 50 mg daily Vitals:   04/30/18 2019 05/01/18 0600  BP: (!) 157/91 134/87  Pulse: 79 70  Resp: 16 15  Temp: 98.4 F (36.9 C) (!) 97.4 F (36.3 C)  SpO2: 100% 100%   Labile on 12/31 9. Dysphagia. Dysphagia #2 nectar thick liquids. Follow-up speech therapy 10. Diabetes mellitus with peripheral neuropathy. Hemoglobin A1c 7.3. Glucotrol 5 mg twice a day,Levemir 15 units twice a day. Check blood sugars before meals and at bedtime. Diabetic teaching CBG (last 3)  Recent Labs    04/30/18 1644 04/30/18 2058 05/01/18 0606  GLUCAP 204* 224* 187*   levimir   23U BID,   glucotrol at home dose of 10mg      Humalog 2 units 3 times daily started on 12/28, increased to 3 units on 12/30, increased to 5 units on 12/31 11. Hyperlipidemia. Lipitor 12.Suspect UTI. Urine study positive nitrites + bact .Begin empiric coverage-  Off abx 13.  Spasticity RUE and RLE --ROM with PT, OT, tizanidine stopped due to sedation, he may be a good candidate for Botox but would likely have to have this done as  an outpatient  Baclofen on 12/26, increased on 12/28, increased on 1231  Bracing ordered  LOS: 27 days A FACE TO FACE EVALUATION WAS PERFORMED  Chantia Amalfitano Karis Jubanil Rhiann Boucher 05/01/2018, 8:50 AM

## 2018-05-01 NOTE — Progress Notes (Signed)
Speech Language Pathology Daily Session Note  Patient Details  Name: Douglas [name] MRN: [id number] Date of Birth: 12-02-1950  Today's Date: 05/01/2018 SLP Individual Time: 0730-0800 SLP Individual Time Calculation (min): 30 min  Short Term Goals: Week 4: SLP Short Term Goal 1 (Week 4): STGs=LTGs  Skilled Therapeutic Interventions: Skilled treatment session focused on speech and dysphagia goals. Upon arrival, patient was awake while supine in bed and had been incontinent. Patient required Max A multimodal cues to follow commands for bed mobility during peri care. SLP also facilitated session by providing Min A verbal cues for use of a slow rate of self-feeding and oral clearance with breakfast meal of Dys. 2 textures with nectar-thick liquids. Patient independently utilized small bites and did not demonstrate any overt s/s of aspiration. Recommend patient continue current diet. Patient left upright in bed with alarm on and all needs within reach. Continue with current plan of care.      Pain No/Denies Pain   Therapy/Group: Individual Therapy  Douglas [name] 05/01/2018, 2:05 PM

## 2018-05-01 NOTE — Patient Care Conference (Signed)
Inpatient RehabilitationTeam Conference and Plan of Care Update Date: 05/01/2018   Time: 9:40 AM    Patient Name: Douglas [name]      Medical Record Number: [id number]  Date of Birth: [birth year] Sex: Male         Room/Bed: 4W22C/4W22C-01 Payor Info: Payor: MEDICARE / Plan: MEDICARE PART A AND B / Product Type: *No Product type* /    Admitting Diagnosis: CVA  Admit Date/Time:  04/04/2018  3:09 PM Admission Comments: No comment available   Primary Diagnosis:  <principal problem not specified> Principal Problem: <principal problem not specified>  Patient Active Problem List   Diagnosis Date Noted  . Labile blood pressure   . 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 Labile blood glucose   . Global aphasia   . Cerebral edema (HCC) 04/04/2018  . Renal insufficiency 04/04/2018  . 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 Acute lower UTI   . Type 2 diabetes mellitus with peripheral neuropathy (HCC)   . Acute embolic stroke (HCC)   . Diabetes 1.5, managed as type 2 (HCC)   . Essential hypertension   . Dyslipidemia   . History of CVA with residual deficit   . Dysphagia, post-stroke     Expected Discharge Date: Expected Discharge Date: 05/02/18  Team Members Present: Physician leading conference: Dr. Maryla MorrowAnkit Patel Social Worker Present: Dossie DerBecky Breslin Hemann, LCSW Nurse Present: Ronny BaconWhitney Reardon, RN PT Present: Woodfin GanjaEmily Van Shagen, PT OT Present: Roney MansJennifer Smith, OT;Ardis Rowanom [name], COTA SLP Present: Feliberto Gottronourtney Payne, SLP     Current Status/Progress Goal Weekly Team Focus  Medical   Right side weakness with aphasia/dysphagia secondary to large left MCA infarction 03/30/18 as well as history of CVA 2013.  Improve mobility, safety, cognition, BP/DM, spasticity  See above   Bowel/Bladder   Incontinent of bowel and bladder despite attempts to timed toilet  Mod assist  Assess and treat for constipation as needed; continue attempts at timed  toileting   Swallow/Nutrition/ Hydration   Dys. 2 textures with nectar-thick liquids, Mod A  Mod A  Family Education    ADL's   bed mobility-max A, UB dressing-mod A, LB dressing-max A bed level, squat pivot transfers-max A with max verbal cues for task initiation, sequencing and attention to R  mod A overall; min A toilet transfers  attention to R, sitting balance, functional transfers, BADL retraining   Mobility             Communication   Total to Max A  Max A  Family Education   Safety/Cognition/ Behavioral Observations  Total to Max A  Max A  Family education    Pain   Denies pain  < 3  Assess for pain q shift and prn   Skin   Skin intact  Min assist  Assess skin q shift and prn      *See Care Plan and progress notes for long and short-term goals.     Barriers to Discharge  Current Status/Progress Possible Resolutions Date Resolved   Physician    Inaccessible home environment;Medical stability     See above  Therapies, optimize DM/HTN meds, increase antispasticity meds, plans for SNF      Nursing                  PT                    OT  SLP                SW                Discharge Planning/Teaching Needs:  Daughter now feels she and fmaily will need to place him in a NH due to the amount of care he requires. [facility]      Team Discussion:  Daughter's feel pt's level is too much for them and now pursuing NHP. Daughter's boyfriend here and will do actual car transfer, [facility]. Making slow gains but not functional. MD has increased his baclofen today. BP and BS up and down MD monitoring.  Incontinent after nursing trying to timed tolieting.   Revisions to Treatment Plan:  NHP now    Continued Need for Acute Rehabilitation Level of Care: The patient requires daily medical management by a physician with specialized training in physical medicine and  rehabilitation for the following conditions: Daily direction of a multidisciplinary physical rehabilitation program to ensure safe treatment while eliciting the highest outcome that is of practical value to the patient.: Yes Daily medical management of patient stability for increased activity during participation in an intensive rehabilitation regime.: Yes Daily analysis of laboratory values and/or radiology reports with any subsequent need for medication adjustment of medical intervention for : Neurological problems;Diabetes problems;Blood pressure problems   I attest that I was present, lead the team conference, and concur with the assessment and plan of the team.   Lucy Chrisupree, Antwaun Buth G 05/01/2018, 9:53 AM

## 2018-05-02 ENCOUNTER — Inpatient Hospital Stay (HOSPITAL_COMMUNITY): Payer: Commercial Indemnity | Admitting: Physical Therapy

## 2018-05-02 DIAGNOSIS — D62 Acute posthemorrhagic anemia: Secondary | ICD-10-CM

## 2018-05-02 LAB — GLUCOSE, CAPILLARY
GLUCOSE-CAPILLARY: 222 mg/dL — AB (ref 70–99)
Glucose-Capillary: 197 mg/dL — ABNORMAL HIGH (ref 70–99)
Glucose-Capillary: 147 mg/dL — ABNORMAL HIGH (ref 70–99)
Glucose-Capillary: 234 mg/dL — ABNORMAL HIGH (ref 70–99)

## 2018-05-02 NOTE — Progress Notes (Signed)
[facility] PHYSICAL MEDICINE & REHABILITATION PROGRESS NOTE   Subjective/Complaints: Patient seen sitting up in bed this morning.  No reported issues overnight.  ROS: Limited due to aphasia   Objective:   No results found. No results for input(s): WBC, HGB, HCT, PLT in the last 72 hours. No results for input(s): NA, K, CL, CO2, GLUCOSE, BUN, CREATININE, CALCIUM in the last 72 hours.  Intake/Output Summary (Last 24 hours) at 05/02/2018 1011 Last data filed at 05/02/2018 0753 Gross per 24 hour  Intake 694 ml  Output -  Net 694 ml     Physical Exam: Vital Signs Blood pressure (!) 156/95, pulse 69, temperature (!) 97.4 F (36.3 C), temperature source Oral, resp. rate 18, height 5\' 10"  (1.778 m), weight 89.1 kg, SpO2 100 %.  Constitutional: No distress . Vital signs reviewed. HENT: Normocephalic.  Atraumatic. Eyes: EOMI. No discharge. Cardiovascular: RRR.  No JVD. Respiratory: CTA bilaterally.  Normal effort. GI: BS +. Non-distended. Musc: No edema or tenderness in extremities. Neurologic:  Alert. Motor: Moving left side spontaneously, no movement noted on right side, stable Significant tone on right side, stable Global aphasia Skin: No evidence of breakdown, no evidence of rash  Assessment/Plan: 1. Functional deficits secondary to Left MCA infarct R HP and Aphasia which require 3+ hours per day of interdisciplinary therapy in a comprehensive inpatient rehab setting.  Physiatrist is providing close team supervision and 24 hour management of active medical problems listed below.  Physiatrist and rehab team continue to assess barriers to discharge/monitor patient progress toward functional and medical goals  Care Tool:  Bathing  Bathing activity did not occur: Refused(Charge nurse notified) Body parts bathed by patient: Right arm, Chest, Abdomen, Front perineal area, Right upper leg, Left upper leg, Face   Body parts bathed by helper: Buttocks, Left arm, Left lower leg,  Right lower leg     Bathing assist Assist Level: Moderate Assistance - Patient 50 - 74%     Upper Body Dressing/Undressing Upper body dressing   What is the patient wearing?: Pull over shirt    Upper body assist Assist Level: Moderate Assistance - Patient 50 - 74%    Lower Body Dressing/Undressing Lower body dressing      What is the patient wearing?: Pants, Incontinence brief     Lower body assist Assist for lower body dressing: Total Assistance - Patient < 25%     Toileting Toileting    Toileting assist Assist for toileting: Total Assistance - Patient < 25%     Transfers Chair/bed transfer  Transfers assist     Chair/bed transfer assist level: 2 Helpers     Locomotion Ambulation   Ambulation assist   Ambulation activity did not occur: Safety/medical concerns  Assist level: Dependent - Patient 0% Assistive device: Lite Gait Max distance: 25'   Walk 10 feet activity   Assist  Walk 10 feet activity did not occur: Safety/medical concerns  Assist level: Dependent - Patient 0% Assistive device: Lite Gait   Walk 50 feet activity   Assist Walk 50 feet with 2 turns activity did not occur: Safety/medical concerns  Assist level: Dependent - Patient 0% Assistive device: Lite Gait    Walk 150 feet activity   Assist Walk 150 feet activity did not occur: Safety/medical concerns         Walk 10 feet on uneven surface  activity   Assist Walk 10 feet on uneven surfaces activity did not occur: Safety/medical concerns  Wheelchair     Assist Will patient use wheelchair at discharge?: Yes Type of Wheelchair: Manual Wheelchair activity did not occur: Safety/medical concerns  Wheelchair assist level: Supervision/Verbal cueing Max wheelchair distance: 100'    Wheelchair 50 feet with 2 turns activity    Assist    Wheelchair 50 feet with 2 turns activity did not occur: Safety/medical concerns   Assist Level:  Supervision/Verbal cueing   Wheelchair 150 feet activity     Assist Wheelchair 150 feet activity did not occur: Safety/medical concerns   Assist Level: Total Assistance - Patient < 25%     Medical Problem List and Plan: 1.  Right side weakness with aphasia/dysphagia secondary to large left MCA infarction 03/30/18 as well as history of CVA 2013.aspirin and Plavix 3 months  Continue CIR   Discharge to SNF pending 2.  DVT Prophylaxis/Anticoagulation: subcutaneous Lovenox. Monitor for any bleeding episodes 3. Pain Management:  Tylenol as needed 4. Mood: BuSpar 7.5 mg twice a day, Provide emotional support  -ritalin to improve arousal, increased to 10mg  5. Neuropsych: This patient is capable of making decisions on his own behalf. 6. Skin/Wound Care:  Routine skin checks 7. Fluids/Electrolytes/Nutrition:    Good nutritional intake on 12/30  BMP within acceptable range on 12/11, repeat labs end of this week 8.Hypertension. Norvasc 10 mg daily, clonidine 0.2 mg daily, Toprol-XL 50 mg daily Vitals:   05/02/18 0517 05/02/18 0736  BP: 121/77 (!) 156/95  Pulse: 62 69  Resp: 18   Temp: (!) 97.4 F (36.3 C)   SpO2: 100%    Labile on 1/1 9. Dysphagia. Dysphagia #2 nectar thick liquids. Follow-up speech therapy 10. Diabetes mellitus with peripheral neuropathy. Hemoglobin A1c 7.3. Glucotrol 5 mg twice a day,Levemir 15 units twice a day. Check blood sugars before meals and at bedtime. Diabetic teaching CBG (last 3)  Recent Labs    05/01/18 1727 05/01/18 2124 05/02/18 0618  GLUCAP 182* 197* 147*   levimir   23U BID,   glucotrol at home dose of 10mg      Humalog 2 units 3 times daily started on 12/28, increased to 3 units on 12/30, increased to 5 units on 12/31  Remains elevated, consider further increase tomorrow 11. Hyperlipidemia. Lipitor 12.Suspect UTI. Urine study positive nitrites + bact .Begin empiric coverage-  Off abx 13.  Spasticity RUE and RLE --ROM with PT, OT, tizanidine  stopped due to sedation, he may be a good candidate for Botox but would likely have to have this done as an outpatient  Baclofen on 12/26, increased on 12/28, increased on 1231  Bracing, ROM 14.  Acute blood loss anemia  Hemoglobin 12.8 on 12/8  Will order labs for the end of this week  LOS: 28 days A FACE TO FACE EVALUATION WAS PERFORMED  Kris No Karis Juba 05/02/2018, 10:11 AM

## 2018-05-02 NOTE — Progress Notes (Signed)
Physical Therapy Session Note  Patient Details  Name: Douglas [name] MRN: [id number] Date of Birth: 02-18-1951  Today's Date: 05/02/2018 PT Individual Time: 1300-1330 PT Individual Time Calculation (min): 30 min   Short Term Goals: Week 4:  PT Short Term Goal 1 (Week 4): =LTGs due to ELOS  Skilled Therapeutic Interventions/Progress Updates:    Pt received seated in bed finishing lunch. No indications of pain. Pt is Supervision assist to eat icecream. Max encouragement to get up to w/c before eating pudding. Supine to sit with max A with HOB elevated. Squat pivot transfer to the L with total A x 2. Pt unable to follow cues to assist with transfer. Once pt seated in TIS w/c in room setup A to eat pudding. Pt left reclined in TIS w/c in room with quick release belt and chair alarm in place, family present.  Therapy Documentation Precautions:  Precautions Precautions: Fall Precaution Comments: dense R hemi Restrictions Weight Bearing Restrictions: No   Therapy/Group: Individual Therapy  Peter Congo, PT, DPT  05/02/2018, 2:27 PM

## 2018-05-03 ENCOUNTER — Inpatient Hospital Stay (HOSPITAL_COMMUNITY): Payer: Commercial Indemnity

## 2018-05-03 ENCOUNTER — Inpatient Hospital Stay (HOSPITAL_COMMUNITY): Payer: Commercial Indemnity | Admitting: Physical Therapy

## 2018-05-03 ENCOUNTER — Inpatient Hospital Stay (HOSPITAL_COMMUNITY): Payer: Commercial Indemnity | Admitting: Speech Pathology

## 2018-05-03 ENCOUNTER — Inpatient Hospital Stay (HOSPITAL_COMMUNITY): Payer: Commercial Indemnity | Admitting: Occupational Therapy

## 2018-05-03 LAB — GLUCOSE, CAPILLARY
GLUCOSE-CAPILLARY: 177 mg/dL — AB (ref 70–99)
Glucose-Capillary: 261 mg/dL — ABNORMAL HIGH (ref 70–99)
Glucose-Capillary: 159 mg/dL — ABNORMAL HIGH (ref 70–99)
Glucose-Capillary: 160 mg/dL — ABNORMAL HIGH (ref 70–99)
Glucose-Capillary: 163 mg/dL — ABNORMAL HIGH (ref 70–99)

## 2018-05-03 MED ORDER — INSULIN DETEMIR 100 UNIT/ML ~~LOC~~ SOLN
25.0000 [IU] | Freq: Two times a day (BID) | SUBCUTANEOUS | Status: DC
  Administered 2018-05-03 – 2018-05-04 (×2): 25 [IU] via SUBCUTANEOUS
  Filled 2018-05-03 (×4): qty 0.25

## 2018-05-03 MED ORDER — INSULIN ASPART 100 UNIT/ML ~~LOC~~ SOLN
8.0000 [IU] | Freq: Three times a day (TID) | SUBCUTANEOUS | Status: DC
  Administered 2018-05-03 – 2018-05-04 (×5): 8 [IU] via SUBCUTANEOUS

## 2018-05-03 NOTE — Progress Notes (Signed)
Social Work Patient ID: Douglas [name], male   DOB: [birth year], 68 y.o.   MRN: [id number] Family has decided upon [facility] in [place]. Have contacted them and am faxing medical information to see if they can offer a bed. Daughter and boyfriend did a car transfer on Monday and it went well. Await response from facility.

## 2018-05-03 NOTE — Progress Notes (Signed)
Occupational Therapy Session Note MAKEUP SESSION  Patient Details  Name: Douglas [name] MRN: [id number] Date of Birth: [birth year]  Today's Date: 05/03/2018 OT Individual Time: 1000-1045 OT Individual Time Calculation (min): 45 min    Short Term Goals: Week 1:  OT Short Term Goal 1 (Week 1): Pt will complete basic transfer with +1 assist in order to decrease caregiver burden OT Short Term Goal 1 - Progress (Week 1): Progressing toward goal OT Short Term Goal 2 (Week 1): Pt will attend to R UE during functional task with max multi-modal cuing OT Short Term Goal 2 - Progress (Week 1): Progressing toward goal OT Short Term Goal 3 (Week 1): Pt will locate 1 item R of midline during functional task with mod cuing OT Short Term Goal 3 - Progress (Week 1): Progressing toward goal Week 2:  OT Short Term Goal 1 (Week 2): Pt will complete basic transfer with +1 assist in order to decrease caregiver burden OT Short Term Goal 1 - Progress (Week 2): Progressing toward goal OT Short Term Goal 2 (Week 2): Pt will attend to R UE during functional task with max multi-modal cuing OT Short Term Goal 2 - Progress (Week 2): Progressing toward goal OT Short Term Goal 3 (Week 2): Pt will locate 1 item R of midline during functional task with mod cuing OT Short Term Goal 3 - Progress (Week 2): Progressing toward goal Week 3:  OT Short Term Goal 1 (Week 3): Pt will complete basic transfer with +1 assist in order to decrease caregiver burden OT Short Term Goal 1 - Progress (Week 3): Met OT Short Term Goal 2 (Week 3): Pt will attend to R UE during functional task with max multi-modal cuing OT Short Term Goal 2 - Progress (Week 3): Met OT Short Term Goal 3 (Week 3): Pt will locate 1 item R of midline during functional task with mod cuing OT Short Term Goal 3 - Progress (Week 3): Progressing toward goal Week 4:  OT Short Term Goal 1 (Week 4): STG=LTG secondary to ELOS (LTG downgraded)  Skilled Therapeutic  Interventions/Progress Updates:    Pt received in bed dressed and ready for the day.  Focused session on NMR and bilateral integration. PROM to RUE initially with minimal tone in elbow flexors.  Good PROM achieved.   Had pt flex L knee as therapist flexed R knee, for trunk rotation. No initiation of movement with guiding on L side as pt not able to follow instructions and R leg no active movement, so passively moved pt's legs.  HOB elevated and had pt grasp hands together with A and worked on ROM in various patterns with resistance from pt to bring hands toward L shoulder due to apraxia.  For R visual scanning, placed sheet of bubble wrap on pt's lap.  Pt actively initiated popping the bubbles after 1 demonstration. He also actively scanned to the R to pop bubbles on his R side.  He did keep trying to pull the plastic to his L so he could avoid R scanning.  Pt continuing with activity at end of session. Pt in bed with all needs met.  Therapy Documentation Precautions:  Precautions Precautions: Fall Precaution Comments: dense R hemi Restrictions Weight Bearing Restrictions: No       Pain: Pain Assessment Pain Scale: Faces Pain Score: 0-No pain Faces Pain Scale: No hurt      Therapy/Group: Individual Therapy  Latrice Storlie 05/03/2018, 11:51 AM

## 2018-05-03 NOTE — Progress Notes (Signed)
Speech Language Pathology Daily Session Note  Patient Details  Name: Mihail Morelan MRN: [id number] Date of Birth: 1951/04/17  Today's Date: 05/03/2018 SLP Individual Time: 0915-0955 SLP Individual Time Calculation (min): 40 min  Short Term Goals: Week 4: SLP Short Term Goal 1 (Week 4): STGs=LTGs  Skilled Therapeutic Interventions: Skilled treatment session focused on cognitive goals. Upon arrival, patient had removed his gown and was attempting to use the urinal. SLP assisted with placement but patient consistently removed urinal back and forth and was never able to void. With encouragement, patient ended task. SLP facilitated session by providing Max-Total A verbal and tactile cues for initiation and problem solving with donning his shorts and shirt while supine in bed. Patient interdependently washed his face when handed a washcloth. Patient nonverbal throughout session and utilized hand gestures in attempts to communicate wants/needs. Patient left upright in bed with alarm on and all needs within reach. Continue with current plan of care.       Pain Pain Assessment Faces Pain Scale: No hurt  Therapy/Group: Individual Therapy  Jakaya Jacobowitz 05/03/2018, 2:56 PM

## 2018-05-03 NOTE — Progress Notes (Signed)
Occupational Therapy Session Note  Patient Details  Name: Ashok CordiaWilliam L Finkler Jr MRN: [id number] Date of Birth: 1950-06-28  Today's Date: 05/03/2018 OT Individual Time: [id number] OT Individual Time Calculation (min): 56 min    Short Term Goals: Week 4:  OT Short Term Goal 1 (Week 4): STG=LTG secondary to ELOS (LTG downgraded)  Skilled Therapeutic Interventions/Progress Updates:    Pt resting in bed upon arrival and physically refused to participate in any activities until presented with breakfast tray.  Pt required setup assistance for eating breakfast with min multimodal cues to locate items on breakfast tray.  Pt indicated he needed to use urinal but was unsuccessful. Pt continued to resist any further engagement and continued to pull covers back up when removed.  Pt continued to perseverate on use of urinal.  OT intervention with focus on active participation in structured functional tasks.  Pt declined using wash cloth when presented. Pt remained in bed with bed alarm activated and needs within reach.   Therapy Documentation Precautions:  Precautions Precautions: Fall Precaution Comments: dense R hemi Restrictions Weight Bearing Restrictions: No Pain:  Pt with no s/s of pain   Therapy/Group: Individual Therapy  Rich BraveLanier, Danyal Whitenack Chappell 05/03/2018, 9:13 AM

## 2018-05-03 NOTE — Progress Notes (Signed)
Physical Therapy Weekly Progress Note  Patient Details  Name: Douglas [name] MRN: [id number] Date of Birth: [birth year]  Beginning of progress report period: April 26, 2018 End of progress report period: May 03, 2018  Today's Date: 05/03/2018 PT Individual Time: 1050-1200 AND 1305-1330 PT Individual Time Calculation (min): 70 min AND 25 min   Patient has met 0 of 8 long term goals. Pt continues to make slow progress towards LTGs, however over last week has demonstrated increased awareness of deficits and is able to participate more meaningfully in therapy sessions. He requires +2 assist for transfers as functional level remains inconsistent. A 2nd helper is often needed just stand-by or contact guard for safety. He is at w/c level only as gait remains non-functional at this time.   Patient continues to demonstrate the following deficits muscle weakness and muscle joint tightness, decreased cardiorespiratoy endurance, abnormal tone, unbalanced muscle activation, motor apraxia, decreased coordination and decreased motor planning, decreased attention to right, decreased initiation, decreased attention, decreased awareness, decreased problem solving, decreased safety awareness, decreased memory and delayed processing and decreased sitting balance, decreased standing balance, decreased postural control, hemiplegia and decreased balance strategies and therefore will continue to benefit from skilled PT intervention to increase functional independence with mobility. Pt's family planning to drive pt to [place] for SNF placement, will continue to work towards decreasing burden of care at the next level.   Patient progressing toward long term goals..  Continue plan of care.  PT Short Term Goals Week 4:  PT Short Term Goal 1 (Week 4): =LTGs due to ELOS Week 5:  PT Short Term Goal 1 (Week 5): =LTGs due to ELOS  Skilled Therapeutic Interventions/Progress Updates:   Session 1:  Pt in supine and  agreeable to therapy, no c/o or evidence of pain throughout session. Pt reached for urinal when it was taken from bed in preparation to transfer to EOB. Made 3 attempts at voiding prior to leaving room as pt continued to point at urinal, unsuccessful at voiding w/ increased time. Mod assist transfer to EOB and total assist slide board transfer to manual w/c. Max verbal/manual/tactile cues for forward trunk lean and technique. Session focused on independence w/ manual w/c mobility. Pt self-propelled w/c via L hemi technique in multiple 50-150' bouts. Min assist needed overall for steering and frequent manual/verbal cues for technique. Switched to 20x20 w/c for improved safety w/ positioning and more lightweight for decreased difficulty w/ propulsion. Kinetron 2 min x2 a@ 50 cm/sec w/ total assist from therapist to perform, no carryover from session earlier in week when pt was able to perform w/ min tactile cues. Returned to room via w/c, ended session in w/c and all needs in reach. NT made aware of trial leaving pt by himself in manual w/c.   Session 2:  Pt in w/c and agreeable to therapy, no c/o or evidence of pain throughout session. Total assist w/c transport to/from therapy gym. Slide board transfer to/from NuStep, max assist +2. Performed NuStep 5 min BLEs and LUE @ level 5 and 4 min @ level 1 w/ BLEs only to work on RLE muscle activation, tone management, and LE dissociation. Frequent tactile cues to increase attention to RLE and for neutral RLE alignment. Returned to room and ended session in w/c, all needs in reach.   Therapy Documentation Precautions:  Precautions Precautions: Fall Precaution Comments: dense R hemi Restrictions Weight Bearing Restrictions: No  Therapy/Group: Individual Therapy  Karen Kinnard Clent Demark 05/03/2018, 12:03 PM

## 2018-05-03 NOTE — Progress Notes (Signed)
Speech Language Pathology Daily Session Note  Patient Details  Name: Douglas [name] MRN: [id number] Date of Birth: Oct 05, 1950  Today's Date: 05/03/2018 SLP Individual Time: 1415-1500 SLP Individual Time Calculation (min): 45 min  Short Term Goals: Week 4: SLP Short Term Goal 1 (Week 4): STGs=LTGs  Skilled Therapeutic Interventions:  Skilled treatment session focused on cognition goals. SLP facilitated session by providing Max A cues to fill-in numbers on calendar, Total A cues to fill in selectively missed numbers and Total A to put like colored thumb tacks on same color. Despite Total A pt was intermittently unable to perform the above mentioned tasks. Pt was returned to room, left upright in wheelchair, lap alarm on and daughter present.      Pain Pain Assessment Pain Scale: Faces Faces Pain Scale: No hurt  Therapy/Group: Individual Therapy  Galdino Hinchman 05/03/2018, 3:07 PM

## 2018-05-03 NOTE — Progress Notes (Signed)
[facility] PHYSICAL MEDICINE & REHABILITATION PROGRESS NOTE   Subjective/Complaints: Patient seen laying in bed this morning.  No reported issues overnight.  ROS: Limited due to aphasia.   Objective:   No results found. No results for input(s): WBC, HGB, HCT, PLT in the last 72 hours. No results for input(s): NA, K, CL, CO2, GLUCOSE, BUN, CREATININE, CALCIUM in the last 72 hours.  Intake/Output Summary (Last 24 hours) at 05/03/2018 0913 Last data filed at 05/02/2018 2000 Gross per 24 hour  Intake 444 ml  Output 800 ml  Net -356 ml     Physical Exam: Vital Signs Blood pressure 120/76, pulse 66, temperature 98 F (36.7 C), temperature source Oral, resp. rate 16, height 5\' 10"  (1.778 m), weight 89.1 kg, SpO2 97 %.  Constitutional: No distress . Vital signs reviewed. HENT: Normocephalic.  Atraumatic. Eyes: EOMI. No discharge. Cardiovascular: RRR.  No JVD. Respiratory: CTA bilaterally.  Normal effort. GI: BS +. Non-distended. Musc: No edema or tenderness in extremities. Neurologic:  Alert. Motor: Moving left side spontaneously, no movement noted on right side, unchanged Significant tone on right side, unchanged Global aphasia Skin: No evidence of breakdown, no evidence of rash  Assessment/Plan: 1. Functional deficits secondary to Left MCA infarct R HP and Aphasia which require 3+ hours per day of interdisciplinary therapy in a comprehensive inpatient rehab setting.  Physiatrist is providing close team supervision and 24 hour management of active medical problems listed below.  Physiatrist and rehab team continue to assess barriers to discharge/monitor patient progress toward functional and medical goals  Care Tool:  Bathing  Bathing activity did not occur: Refused(Charge nurse notified) Body parts bathed by patient: Right arm, Chest, Abdomen, Front perineal area, Right upper leg, Left upper leg, Face   Body parts bathed by helper: Buttocks, Left arm, Left lower leg,  Right lower leg     Bathing assist Assist Level: Moderate Assistance - Patient 50 - 74%     Upper Body Dressing/Undressing Upper body dressing   What is the patient wearing?: Pull over shirt    Upper body assist Assist Level: Moderate Assistance - Patient 50 - 74%    Lower Body Dressing/Undressing Lower body dressing      What is the patient wearing?: Pants, Incontinence brief     Lower body assist Assist for lower body dressing: Total Assistance - Patient < 25%     Toileting Toileting    Toileting assist Assist for toileting: Total Assistance - Patient < 25%     Transfers Chair/bed transfer  Transfers assist     Chair/bed transfer assist level: 2 Helpers     Locomotion Ambulation   Ambulation assist   Ambulation activity did not occur: Safety/medical concerns  Assist level: Dependent - Patient 0% Assistive device: Lite Gait Max distance: 25'   Walk 10 feet activity   Assist  Walk 10 feet activity did not occur: Safety/medical concerns  Assist level: Dependent - Patient 0% Assistive device: Lite Gait   Walk 50 feet activity   Assist Walk 50 feet with 2 turns activity did not occur: Safety/medical concerns  Assist level: Dependent - Patient 0% Assistive device: Lite Gait    Walk 150 feet activity   Assist Walk 150 feet activity did not occur: Safety/medical concerns         Walk 10 feet on uneven surface  activity   Assist Walk 10 feet on uneven surfaces activity did not occur: Safety/medical concerns  Wheelchair     Assist Will patient use wheelchair at discharge?: Yes Type of Wheelchair: Manual Wheelchair activity did not occur: Safety/medical concerns  Wheelchair assist level: Supervision/Verbal cueing Max wheelchair distance: 100'    Wheelchair 50 feet with 2 turns activity    Assist    Wheelchair 50 feet with 2 turns activity did not occur: Safety/medical concerns   Assist Level:  Supervision/Verbal cueing   Wheelchair 150 feet activity     Assist Wheelchair 150 feet activity did not occur: Safety/medical concerns   Assist Level: Total Assistance - Patient < 25%     Medical Problem List and Plan: 1.  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  DVT Prophylaxis/Anticoagulation: subcutaneous Lovenox. Monitor for any bleeding episodes 3. Pain Management:  Tylenol as needed 4. Mood: BuSpar 7.5 mg twice a day, Provide emotional support  -ritalin to improve arousal, increased to 10mg  5. Neuropsych: This patient is capable of making decisions on his own behalf. 6. Skin/Wound Care:  Routine skin checks 7. Fluids/Electrolytes/Nutrition:    Good nutritional intake on 12/30  BMP within acceptable range on 12/11, labs ordered for tomorrow 8.Hypertension. Norvasc 10 mg daily, clonidine 0.2 mg daily, Toprol-XL 50 mg daily Vitals:   05/02/18 1938 05/03/18 0605  BP: (!) 169/82 120/76  Pulse: 84 66  Resp: 15 16  Temp: 98.1 F (36.7 C) 98 F (36.7 C)  SpO2: 100% 97%   Labile, but?  Improving on 1/2 9. Dysphagia. Dysphagia #2 nectar thick liquids. Follow-up speech therapy 10. Diabetes mellitus with peripheral neuropathy. Hemoglobin A1c 7.3. Glucotrol 5 mg twice a day,Levemir 15 units twice a day. Check blood sugars before meals and at bedtime. Diabetic teaching CBG (last 3)  Recent Labs    05/02/18 1646 05/02/18 2121 05/03/18 0603  GLUCAP 234* 261* 177*   levimir   23U BID, increased to 25 on 1/2  Glucotrol at home dose of 10mg      Humalog 2 units 3 times daily started on 12/28, increased to 3 units on 12/30, increased to 5 units on 12/31, increased to 8 units on 1/2 11. Hyperlipidemia. Lipitor 12.Suspect UTI. Urine study positive nitrites + bact .Begin empiric coverage-  Off abx 13.  Spasticity RUE and  RLE --ROM with PT, OT, tizanidine stopped due to sedation, he may be a good candidate for Botox but would likely have to have this done as an outpatient  Baclofen on 12/26, increased on 12/28, increased on 1231  Bracing, ROM 14.  Acute blood loss anemia  Hemoglobin 12.8 on 12/8  Labs ordered for tomorrow  LOS: 29 days A FACE TO FACE EVALUATION WAS PERFORMED  Desiree Daise Karis Juba 05/03/2018, 9:13 AM

## 2018-05-04 ENCOUNTER — Inpatient Hospital Stay (HOSPITAL_COMMUNITY): Payer: Commercial Indemnity

## 2018-05-04 ENCOUNTER — Inpatient Hospital Stay (HOSPITAL_COMMUNITY): Payer: Commercial Indemnity | Admitting: Occupational Therapy

## 2018-05-04 ENCOUNTER — Inpatient Hospital Stay (HOSPITAL_COMMUNITY): Payer: Commercial Indemnity | Admitting: Physical Therapy

## 2018-05-04 ENCOUNTER — Inpatient Hospital Stay (HOSPITAL_COMMUNITY): Payer: Commercial Indemnity | Admitting: Speech Pathology

## 2018-05-04 LAB — CBC WITH DIFFERENTIAL/PLATELET
Abs Immature Granulocytes: 0.04 10*3/uL (ref 0.00–0.07)
BASOS PCT: 1 %
Basophils Absolute: 0 10*3/uL (ref 0.0–0.1)
Eosinophils Absolute: 0.2 10*3/uL (ref 0.0–0.5)
Eosinophils Relative: 3 %
HCT: 38.1 % — ABNORMAL LOW (ref 39.0–52.0)
Hemoglobin: 12.6 g/dL — ABNORMAL LOW (ref 13.0–17.0)
Immature Granulocytes: 1 %
Lymphocytes Relative: 23 %
Lymphs Abs: 1.4 10*3/uL (ref 0.7–4.0)
MCH: 29.7 pg (ref 26.0–34.0)
MCHC: 33.1 g/dL (ref 30.0–36.0)
MCV: 89.9 fL (ref 80.0–100.0)
Monocytes Absolute: 0.6 10*3/uL (ref 0.1–1.0)
Monocytes Relative: 10 %
Neutro Abs: 3.8 10*3/uL (ref 1.7–7.7)
Neutrophils Relative %: 62 %
PLATELETS: 166 10*3/uL (ref 150–400)
RBC: 4.24 MIL/uL (ref 4.22–5.81)
RDW: 12.7 % (ref 11.5–15.5)
WBC: 6 10*3/uL (ref 4.0–10.5)
nRBC: 0 % (ref 0.0–0.2)

## 2018-05-04 LAB — BASIC METABOLIC PANEL
Anion gap: 8 (ref 5–15)
BUN: 21 mg/dL (ref 8–23)
CO2: 27 mmol/L (ref 22–32)
Calcium: 9.5 mg/dL (ref 8.9–10.3)
Chloride: 106 mmol/L (ref 98–111)
Creatinine, Ser: 1.37 mg/dL — ABNORMAL HIGH (ref 0.61–1.24)
GFR calc Af Amer: >60 mL/min (ref >60)
GFR, EST NON AFRICAN AMERICAN: 53 mL/min — AB (ref >60)
Glucose, Bld: 140 mg/dL — ABNORMAL HIGH (ref 70–99)
Potassium: 4.2 mmol/L (ref 3.5–5.1)
SODIUM: 141 mmol/L (ref 135–145)

## 2018-05-04 LAB — GLUCOSE, CAPILLARY
Glucose-Capillary: 130 mg/dL — ABNORMAL HIGH (ref 70–99)
Glucose-Capillary: 145 mg/dL — ABNORMAL HIGH (ref 70–99)
Glucose-Capillary: 123 mg/dL — ABNORMAL HIGH (ref 70–99)

## 2018-05-04 MED ORDER — METHYLPHENIDATE HCL 10 MG PO TABS: 10.0000 mg | ORAL_TABLET | Freq: Two times a day (BID) | ORAL | 0 refills | Status: DC

## 2018-05-04 MED ORDER — METOPROLOL SUCCINATE ER 50 MG PO TB24: 50.0000 mg | ORAL_TABLET | Freq: Every day | ORAL | 0 refills | Status: DC

## 2018-05-04 MED ORDER — INSULIN DETEMIR 100 UNIT/ML ~~LOC~~ SOLN: 25.0000 [IU] | Freq: Two times a day (BID) | SUBCUTANEOUS | 11 refills | Status: DC

## 2018-05-04 MED ORDER — BACLOFEN 20 MG PO TABS: 20.0000 mg | ORAL_TABLET | Freq: Two times a day (BID) | ORAL | 0 refills | Status: DC

## 2018-05-04 MED ORDER — CLONIDINE HCL 0.2 MG PO TABS: 0.2000 mg | ORAL_TABLET | Freq: Two times a day (BID) | ORAL | 6 refills | Status: DC

## 2018-05-04 MED ORDER — GLIPIZIDE 10 MG PO TABS: 10.0000 mg | ORAL_TABLET | Freq: Two times a day (BID) | ORAL | 3 refills | Status: DC

## 2018-05-04 MED ORDER — HYDRALAZINE HCL 50 MG PO TABS: 50.0000 mg | ORAL_TABLET | Freq: Three times a day (TID) | ORAL | 0 refills | Status: DC

## 2018-05-04 MED ORDER — PANTOPRAZOLE SODIUM 40 MG PO TBEC: 40.0000 mg | DELAYED_RELEASE_TABLET | Freq: Every day | ORAL | 0 refills | Status: DC

## 2018-05-04 MED ORDER — ATORVASTATIN CALCIUM 20 MG PO TABS: 20.0000 mg | ORAL_TABLET | Freq: Every day | ORAL | 3 refills | Status: DC

## 2018-05-04 MED ORDER — AMLODIPINE BESYLATE 10 MG PO TABS: 10.0000 mg | ORAL_TABLET | Freq: Every day | ORAL | 3 refills | Status: DC

## 2018-05-04 MED ORDER — CLOPIDOGREL BISULFATE 75 MG PO TABS: 75.0000 mg | ORAL_TABLET | Freq: Every day | ORAL | 0 refills | Status: DC

## 2018-05-04 NOTE — Progress Notes (Signed)
Social Work  Discharge Note  The overall goal for the admission was met for:   Discharge location: [facility]  Length of Stay: Yes-29 DAYS  Discharge activity level: Yes-MAX ASSIST LEVEL  Home/community participation: Yes  Services provided included: MD, RD, PT, OT, SLP, RN, CM, TR, Pharmacy, Neuropsych and SW  Financial Services: Medicare  Follow-up services arranged: Other: SHORT TERM NHP  Comments (or additional information):[name] HAS BEEN THE WHOLE TIME AND PARTICIPATED IN HIS THERAPIES, ALONG WITH HER BOYFRIEND WHO CAN DO THE CAR TRANSFER WITH PT. DRIVING DOWN TO [facility] AND STOPPING HALF WAY BEFORE PROCEEDING TO [facility] AT THE NH. THEY HAVE A PACKET OF PT'S MEDICAL INFORMATION AND THE REST HAS ALSO BEEN FAXED TO FACILITY.   Patient/Family verbalized understanding of follow-up arrangements: Yes  Individual responsible for coordination of the follow-up plan: [name]-DAUGHTER  Confirmed correct DME delivered: Elease Hashimoto 05/04/2018    Elease Hashimoto

## 2018-05-04 NOTE — Progress Notes (Signed)
Occupational Therapy Session Note  Patient Details  Name: Douglas [name] MRN: [id number] Date of Birth: 1950/07/13  Today's Date: 05/04/2018 OT Individual Time: 9702-6378 OT Individual Time Calculation (min): 75 min    Short Term Goals: Week 4:  OT Short Term Goal 1 (Week 4): STG=LTG secondary to ELOS (LTG downgraded) OT Short Term Goal 1 - Progress (Week 4): Progressing toward goal  Skilled Therapeutic Interventions/Progress Updates:    OT intervention with focus on bed mobility, BADL retraiing sit<>stand from EOB, functional tranfsers, sit<>stand, standing balance, w/c mobility, and activity tolerance to increased independence with bADLs and decrease caregiver burden.  Pt required mod A for UB dressing and max A for LB dressing with +2 to pull up pants. Pt performed squat pivot transfer to w/c with max A. Pt engaged in sit<>stand from EOB with +2 (three musketeer) with max verbal cues for upright posture.  Pt stood X 3.  Pt returned to his room and remained in w/c with all needs within reach and belt alarm activated.   Therapy Documentation Precautions:  Precautions Precautions: Fall Precaution Comments: dense R hemi Restrictions Weight Bearing Restrictions: No Pain: Pain Assessment Pain Scale: Faces Faces Pain Scale: No hurt   Therapy/Group: Individual Therapy  Rich Brave 05/04/2018, 12:39 PM

## 2018-05-04 NOTE — Progress Notes (Signed)
Physical Therapy Discharge Summary  Patient Details  Name: Douglas [name] MRN: [id number] Date of Birth: 01-Jan-1951  Patient has met 2 of 7 long term goals due to ongoing impairments in tone, midline orientation, postural control, R attention, and motor planning/apraxia. All impairments have improved since evaluation, however continue to limit pt's ability to meaningfully participate in therapy and make progress towards LTGs. Patient to discharge at a wheelchair level Supervision for propulsion and max assist for transfers. Patient's care partner is independent to provide the necessary physical and cognitive assistance at discharge. Daughter and her boyfriend are planning to drive pt to [place] for admission to SNF near their home. Both have been trained on car transfer for trip to [place].   Reasons goals not met: see above  Recommendation:  Patient will benefit from ongoing skilled PT services in skilled nursing facility setting to continue to advance safe functional mobility, address ongoing impairments in postural control, RLE strength and muscle activation, motor planning and sequencing, midline orientation, increased RLE tone, and R inattention, and minimize fall risk.  Equipment: No equipment provided  Reasons for discharge: lack of progress toward goals and discharge from hospital  Patient/family agrees with progress made and goals achieved: Yes  PT Discharge Precautions/Restrictions Precautions Precautions: Fall Precaution Comments: dense R hemi Restrictions Weight Bearing Restrictions: No Vital Signs Therapy Vitals Pulse Rate: 87 BP: 134/82 Pain Pain Assessment Pain Scale: Faces Faces Pain Scale: No hurt Vision/Perception  Vision - Assessment Tracking/Visual Pursuits: Requires cues, head turns, or add eye shifts to track;Other (comment);Impaired - to be further tested in functional context;Decreased smoothness of horizontal tracking Additional Comments: Able to  turn head/scan w/ verbal cues, eyes tracked past midline to R once head was at end range R turn Perception Perception: Impaired Inattention/Neglect: Does not attend to right side of body;Does not attend to right visual field Praxis Praxis: Impaired Praxis Impairment Details: Motor planning;Ideomotor;Perseveration  Cognition Overall Cognitive Status: Impaired/Different from baseline Arousal/Alertness: Awake/alert Orientation Level: (unable to assess 2/2 cognition) Memory: Impaired Awareness: Impaired Problem Solving: Impaired Sequencing: Impaired Safety/Judgment: Impaired Comments: R inattention, although improved since eval, decreased awareness of deficits  Sensation Sensation Light Touch: Not tested(unable to formally assess 2/2 aphasia) Coordination Gross Motor Movements are Fluid and Coordinated: No Fine Motor Movements are Fluid and Coordinated: No Motor  Motor Motor: Hemiplegia;Abnormal tone;Motor apraxia Motor - Discharge Observations: R hemi; increased gastroc, HS, and quad tone  Mobility Bed Mobility Bed Mobility: Rolling Right;Rolling Left;Supine to Sit;Sit to Supine Rolling Right: Moderate Assistance - Patient 50-74% Rolling Left: Maximal Assistance - Patient 25-49% Supine to Sit: Moderate Assistance - Patient 50-74% Sit to Supine: Moderate Assistance - Patient 50-74% Transfers Transfers: Sit to Stand;Stand to Sit;Stand Pivot Transfers Sit to Stand: Maximal Assistance - Patient 25-49% Stand to Sit: Maximal Assistance - Patient 25-49% Stand Pivot Transfers: Maximal Assistance - Patient 25 - 49% Stand Pivot Transfer Details: Manual facilitation for weight shifting;Manual facilitation for placement;Manual facilitation for weight bearing;Verbal cues for technique;Verbal cues for sequencing;Tactile cues for posture Transfer (Assistive device): None Locomotion  Gait Ambulation: No Gait Gait: No Stairs / Additional Locomotion Stairs: No Wheelchair  Mobility Wheelchair Mobility: Yes Wheelchair Assistance: Supervision/Verbal cueing Wheelchair Propulsion: Left lower extremity;Left upper extremity Wheelchair Parts Management: Supervision/cueing Distance: 150'  Trunk/Postural Assessment  Cervical Assessment Cervical Assessment: Exceptions to WFL(forward head posture) Thoracic Assessment Thoracic Assessment: Exceptions to WFL(rounded shoulders) Lumbar Assessment Lumbar Assessment: Exceptions to WFL(posterior pelvic tilt, unable to reach neutral ) Postural Control Postural Control: Deficits on  evaluation(delayed/insufficient)  Balance Balance Balance Assessed: Yes Static Sitting Balance Static Sitting - Balance Support: Left upper extremity supported;Feet supported Static Sitting - Level of Assistance: 5: Stand by assistance Dynamic Sitting Balance Dynamic Sitting - Balance Support: Left upper extremity supported;Feet supported Dynamic Sitting - Level of Assistance: 4: Min assist Static Standing Balance Static Standing - Balance Support: During functional activity;Left upper extremity supported Static Standing - Level of Assistance: 1: +1 Total assist Dynamic Standing Balance Dynamic Standing - Balance Support: During functional activity Dynamic Standing - Level of Assistance: 1: +2 Total assist Dynamic Standing - Comments: Standing 3 muskateer style, requires total assist +2 to maintain standing  Extremity Assessment  RLE Assessment RLE Assessment: Exceptions to [facility] Passive Range of Motion (PROM) Comments: able to reach full range w/ increased time and stretching 2/2 increased tone General Strength Comments: unable to formally assess 2/2 aphasia and R inattention, does not move extremity against gravity LLE Assessment LLE Assessment: Within Functional Limits Passive Range of Motion (PROM) Comments: [facility]    Rayson Rando K Ariyona Eid 05/04/2018, 11:03 AM

## 2018-05-04 NOTE — Progress Notes (Signed)
Social Work Patient ID: Douglas [name], male   DOB: 1950-09-29, 68 y.o.   MRN: [id number] Spoke with Thomos Lemons Coordinator at [facility] she has all of the paperwork needed to admit pt tomorrow. Have faxed most of the medical information and will be sending the packet with his daughter-[name]. Now will await the time they are planning on leaving from here. Made [name] aware Sherri Rad will be contacting her to complete the paperwork.

## 2018-05-04 NOTE — Progress Notes (Signed)
Speech Language Pathology Discharge Summary  Patient Details  Name: Douglas [name] MRN: [id number] Date of Birth: 10/11/1950  Today's Date: 05/04/2018 SLP Individual Time: 9833-8250 SLP Individual Time Calculation (min): 45 min   Skilled Therapeutic Interventions:  Skilled treatment session focused on speech and dysphagia goals. SLP facilitated session by providing extra time and Max A multimodal cues for patient to answer basic yes/no questions accurately in regards to tray set-up with use of gestures. Patient consumed breakfast meal of Dys. 2 textures with nectar-thick liquids without overt s/s of aspiration but required total A for use of swallowing compensatory strategies. Patient handed off to RN. Continue with current plan of care.   Patient has met 3 of 7 long term goals.  Patient to discharge at [facility] Max;Total level.   Reasons goals not met: Patient continues to require Max-Total A for use of multimodal communcation to express wants/needs and for basic cognitive tasks.    Clinical Impression/Discharge Summary: Patient has made minimal progress and has met 3 of 7 LTGs this admission. Currently, patient is consuming Dys. 2 textures with nectar-thick liquids with minimal overt s/s of aspiration and Mod-Max A verbal and tactile cues for use of swallowing strategies. Continued overt s/s of aspiration with thin liquids noted, therefore, a repeat MBS has not been administered. Patient remains essentially nonverbal and requires overall Max-Total A for use of multimodal communication to express basic wants/needs and for basic auditory comprehension in regards to following commands and yes/no accuracy. Patient also required Max-Total A for problem solving, attention to right visual field and awareness with basic and familiar tasks. Patient and family educatin is complete and patient's daughter and her boyfriend are planning to drive pt to [place] for admission to SNF near their home. Patient  would benefit from f/u SLP services to maximize his functional communication, swallowing function, and cognitive functioning and reduce caregiver burden.   Care Partner:  Caregiver Able to Provide Assistance: Yes  Type of Caregiver Assistance: Physical;Cognitive  Recommendation:  Skilled Nursing facility  Rationale for SLP Follow Up: Maximize functional communication;Reduce caregiver burden;Maximize cognitive function and independence;Maximize swallowing safety   Equipment: thickener    Reasons for discharge: Discharged from hospital   Patient/Family Agrees with Progress Made and Goals Achieved: Yes    [place], [place] 05/04/2018, 3:10 PM

## 2018-05-04 NOTE — Discharge Summary (Signed)
Discharge summary job # [id number]

## 2018-05-04 NOTE — Progress Notes (Signed)
Physical Therapy Session Note  Patient Details  Name: Douglas [name] MRN: [id number] Date of Birth: 07-Aug-1950  Today's Date: 05/04/2018 PT Individual Time: 1000-1055 PT Individual Time Calculation (min): 55 min   Short Term Goals: Week 5:  PT Short Term Goal 1 (Week 5): =LTGs due to ELOS  Skilled Therapeutic Interventions/Progress Updates:   Pt in w/c and agreeable to therapy, no c/o or evidence of pain throughout session. Pt self-propelled w/c >150' to ortho gym via L hemi technique w/ increased time and intermittent min assist for steering and turning. Pt primarily performing w/ supervision and verbal/visual cues for technique and to incorporate LLE. Practiced car transfer x2 w/ max assist, 2nd helper close stand-by for safety. Daughter present at this time and agreeable to take pt this afternoon to begin making trip to [place]. She plans to either drive through the night w/ her boyfriend or stop at a hotel. She states she has a transport chair and will use this to take into hotel room if need be. Will make treatment team aware of daughter's decision. Performed kinetron 2 min x3 to work on LE dissociation and RLE muscle activation. Mod-max assist to utilize RLE, to attend to task, and for R body awareness. Tactile cues for R quad activation. Returned to room, total assist in w/c. Squad pivot transfer to EOB w/ max assist. Ended session in supine, all needs in reach.    Therapy Documentation Precautions:  Precautions Precautions: Fall Precaution Comments: dense R hemi Restrictions Weight Bearing Restrictions: No  Therapy/Group: Individual Therapy  Jarone Ostergaard K Shannah Conteh 05/04/2018, 12:02 PM

## 2018-05-04 NOTE — Progress Notes (Signed)
Occupational Therapy Discharge Summary  Patient Details  Name: Douglas [name] MRN: [id number] Date of Birth: [birth year]  Patient has met 7 of 8 long term goals due to improved activity tolerance, improved balance and postural control. Pt progress with BADLs was slow and minimal during this admission.  LTGs were downgraded secondary to minimal progress in this setting. Pt requires mod A for bathing at shower level and UB dressing tasks.  Pt requires tot A for LB dressing tasks.  Pt initiates dressing tasks when presented with clothing but requires assistance to complete tasks.  Pt perseverates on bathing tasks and requires max multimodal cues for initiation and sequencing.  Pt performs squat pivot tranfsers with max A and max multimodal cues for sequencing.  Pt is discharging to SNF in [place].  Pt's family is transporting pt to [facility] via personal auto. [name] has been present for therapy but has not actively participated.  Patient to discharge at overall mod to total A level.  Patient's care partner unavailable to provide the necessary physical and cognitive assistance at discharge.    Reasons goals not met: n/a  Recommendation:  Patient will benefit from ongoing skilled OT services in skilled nursing facility setting to continue to advance functional skills in the area of BADL and Reduce care partner burden.  Equipment: No equipment provided Discharge to SNF  Reasons for discharge: treatment goals met and discharge from hospital  Patient/family agrees with progress made and goals achieved: Yes  OT Discharge Vision Baseline Vision/History: No visual deficits Wears Glasses: At all times Patient Visual Report: Other (comment)(unable to assess secondary to aphasia) Vision Assessment?: Vision impaired- to be further tested in functional context Tracking/Visual Pursuits: Requires cues, head turns, or add eye shifts to track;Other (comment);Impaired - to be further tested in functional  context;Decreased smoothness of horizontal tracking Additional Comments: Able to turn head/scan w/ verbal cues, eyes tracked past midline to R once head was at end range R turn Perception  Perception: Impaired Inattention/Neglect: Does not attend to right side of body;Does not attend to right visual field Praxis Praxis: Impaired Praxis Impairment Details: Motor planning;Ideomotor;Perseveration Cognition Overall Cognitive Status: Impaired/Different from baseline Arousal/Alertness: Awake/alert Orientation Level: Other (comment)(unable to assess secondary to aphasia) Attention: Sustained Focused Attention: Appears intact Sustained Attention: Impaired Sustained Attention Impairment: Functional basic Memory: Impaired Awareness: Impaired Awareness Impairment: Intellectual impairment Problem Solving: Impaired Problem Solving Impairment: Functional basic Sequencing: Impaired Behaviors: Restless Safety/Judgment: Impaired Comments: R inattention, although improved since eval, decreased awareness of deficits  Sensation Sensation Light Touch: Not tested(unable to assess secondary to aphasia) Hot/Cold: Not tested Proprioception: Impaired by gross assessment Proprioception Impaired Details: Absent RUE;Absent RLE Coordination Gross Motor Movements are Fluid and Coordinated: No Fine Motor Movements are Fluid and Coordinated: No Motor  Motor Motor: Hemiplegia;Abnormal tone;Motor apraxia Motor - Skilled Clinical Observations: Dense R hemi  Motor - Discharge Observations: R hemi; increased gastroc, HS, and quad tone Mobility  Bed Mobility Bed Mobility: Rolling Right;Rolling Left;Supine to Sit;Sit to Supine Rolling Right: Moderate Assistance - Patient 50-74% Rolling Left: Maximal Assistance - Patient 25-49% Supine to Sit: Moderate Assistance - Patient 50-74% Sit to Supine: Moderate Assistance - Patient 50-74% Transfers Sit to Stand: Maximal Assistance - Patient 25-49% Stand to Sit:  Maximal Assistance - Patient 25-49%  Trunk/Postural Assessment  Cervical Assessment Cervical Assessment: Exceptions to WFL(foreard head) Thoracic Assessment Thoracic Assessment: Exceptions to WFL(rounded shoulders) Lumbar Assessment Lumbar Assessment: Exceptions to WFL(posterior pelvic tilt) Postural Control Postural Control: Deficits on evaluation(delayed)  Balance  Balance Balance Assessed: Yes Static Sitting Balance Static Sitting - Balance Support: Left upper extremity supported;Feet supported Static Sitting - Level of Assistance: 5: Stand by assistance Dynamic Sitting Balance Dynamic Sitting - Balance Support: Left upper extremity supported;Feet supported Dynamic Sitting - Level of Assistance: 4: Min assist Static Standing Balance Static Standing - Balance Support: During functional activity;Left upper extremity supported Static Standing - Level of Assistance: 1: +1 Total assist Dynamic Standing Balance Dynamic Standing - Balance Support: During functional activity Dynamic Standing - Level of Assistance: 1: +2 Total assist Dynamic Standing - Comments: Standing 3 muskateer style, requires total assist +2 to maintain standing  Extremity/Trunk Assessment RUE Assessment RUE Assessment: Exceptions to [facility] General Strength Comments: Observe 0/5. Unable to formally assess 2/2 cognitive deficits and severe R inattention RUE Body System: Neuro Brunstrum levels for arm and hand: Arm;Hand Brunstrum level for arm: Stage I Presynergy Brunstrum level for hand: Stage I Flaccidity LUE Assessment LUE Assessment: Within Functional Limits   [name] 05/04/2018, 1:46 PM

## 2018-05-04 NOTE — Progress Notes (Signed)
Pt was discharged and left the hospital with his daughter and ex wife. Pt left with all personal belongings and all questions were answered prior to leaving the hospital. Reuben Likes, LPN

## 2018-05-04 NOTE — Progress Notes (Signed)
Social Work Patient ID: Douglas [name], male   DOB: [birth year], 68 y.o.   MRN: [id number] Bed offer received from [facility] and [place] in [place]. Would like pt to be there by 8:00 pm tomorrow. Discussed with daughter plan and this is their first choice of facilities. Plan now is to leave today and stop half way and stay in an hotel, pt going with daughter and boyfriend who has demonstrated their ability to care for pt. Will give daughter packet and fax medical information to facility also. RN to give pt an enema prior to DC today, also pt to wear a condom cath. Daughter to take diapers and has all meds except three which Dan-PA given to daughter to fill prior to leaving the state. Daughter to let know when plans to leave today will have PT do the car transfer.

## 2018-05-04 NOTE — Discharge Summary (Signed)
NAME: Ashok CordiaFLAKES JR, Kayle L. MEDICAL RECORD [id number] ACCOUNT [id number] DATE OF BIRTH:1951/02/03 FACILITY: [facility] LOCATION: [facility] PHYSICIAN:ANDREW Wynn BankerKIRSTEINS, MD  DISCHARGE SUMMARY  DATE OF DISCHARGE:  05/05/2018  DISCHARGE DIAGNOSES: 1.  Left middle cerebral artery infarction. 2.  Subcutaneous Lovenox for deep venous thrombosis prophylaxis. 3.  Hypertension. 4.  Dysphagia. 5.  Diabetes mellitus. 6.  Hyperlipidemia. 7.  Suspect urinary tract infection. 8.  Spasticity, right upper, right lower extremity. 9.  Acute blood loss anemia.  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  Per chart review and daughter, the patient lives alone, independent  with assistive device prior to admission.  Presented 03/30/2018 with increasing speech difficulty.  Limited mobility.  Cranial CT scan showed remote left PCA territory nonhemorrhagic infarction.  CT angiogram of head and neck showed large left MCA  territory infarction with extremely poor collaterals.  Emergent large vessel occlusion of proximal left M1 segment.  Initially maintained on 3% saline for a short time.  Echocardiogram with ejection fraction of 65% without emboli.  Followup MRI showed  large middle cereal artery infarction.  Dysphagia #2, nectar-thick liquid diet.  TEE completed.  Ejection fraction of 65%.  Thrombus noted left atrial appendage.  No PFO.  Maintained on aspirin and Plavix for CVA prophylaxis.  Subcutaneous Lovenox for  DVT prophylaxis.  The patient spiked a low-grade fever 04/03/2018.  Chest x-ray negative.  Urine positive nitrite.  Cultures and sensitivities were pending.  Therapy evaluations completed.  The patient was admitted for a comprehensive rehab program.  PAST MEDICAL HISTORY:  See discharge diagnoses.  SOCIAL HISTORY:  Lives alone, was independent with assistive device  prior to admission.  FUNCTIONAL STATUS:  Upon admission to rehab services was +2 physical assist sit to stand, +2 sit to supine, max total assist with activities of daily living.  PHYSICAL EXAMINATION: VITAL SIGNS:  Blood pressure 142/78, pulse 66, temperature 98, respirations 15. GENERAL:  Alert male.  Globally aphasic. HEENT:  EOMs intact. NECK:  Supple, nontender, no JVD. CARDIOVASCULAR:  Rate controlled. ABDOMEN:  Soft, nontender, good bowel sounds. LUNGS:  Clear to auscultation without wheeze.  Spontaneously moves left upper extremity.  REHABILITATION HOSPITAL COURSE:  The patient was admitted to inpatient rehabilitation services.  Therapies initiated on a 3-hour daily basis, consisting of physical therapy, occupational therapy, speech therapy and rehab nursing.  The following issues  were addressed during patient's rehabilitation stay.  Pertaining to the patient's left MCA infarction, he remained stable.  Aspirin and Plavix x3 months.  Subcutaneous Lovenox for DVT prophylaxis during hospital stay.  No bleeding episodes.  Mood  stabilization with the addition of Ritalin to help stimulate the patient's ability to focus and attend to tasks.  Blood pressure is controlled and monitored on Toprol, hydralazine, clonidine as well as Norvasc.  Blood sugars: Hemoglobin A1c 7.3.   Glucotrol 10 mg b.i.d., Levemir insulin 25 units b.i.d.  He continued on Lipitor for hyperlipidemia.  He completed an empiric course of antibiotic therapy for suspect UTI remaining afebrile.  No dysuria or hematuria.  Spasticity of right upper and lower  extremity.  Continued on baclofen 20 mg b.i.d.  There was consideration of Botox injection as an outpatient.  The patient received weekly collaborative interdisciplinary team conferences to discuss estimated length of stay, family teaching, any barriers  to discharge.  He requires +2 assist for transfers at a functional level, remaining very inconsistent.  Demonstrates  deficits in muscle weakness, muscle joint tightness.  Cardiorespiratory endurance.  Activities of daily living and homemaking.  Required  setup assistance for eating breakfast with minimal cues.  Max total assist for hygiene, bathing.  Due to limited advances, it was felt skilled nursing facility was needed with bed becoming available 05/05/2018.  DISCHARGE MEDICATIONS:  Included Norvasc 10 mg p.o. daily, aspirin 325 mg p.o. daily, Lipitor 20 mg p.o. at bedtime, baclofen 20 mg p.o. b.i.d., clonidine 0.2 mg p.o. b.i.d., Plavix 75 mg p.o. daily, Glucotrol 10 mg p.o. b.i.d., hydralazine 50 mg p.o.  t.i.d., Levemir 25 units b.i.d., Ritalin 10 mg p.o. b.i.d., Toprol 50 mg p.o. daily, Protonix 40 mg p.o. daily.  DIET:  Dysphagia #2 nectar-thick liquid.  FOLLOWUP:  Dr. Claudette Laws at the outpatient rehab center as advised; Dr. Roda Shutters of [facility] Neurology services, call for appointment.  LN/NUANCE D:05/04/2018 T:05/04/2018 JOB:[id number]

## 2018-05-07 ENCOUNTER — Inpatient Hospital Stay (HOSPITAL_COMMUNITY): Payer: Commercial Indemnity

## 2018-06-25 ENCOUNTER — Other Ambulatory Visit: Payer: Self-pay

## 2018-06-25 NOTE — Patient Outreach (Signed)
First attempt to obtain mRs. No answer. Left message for return call.  

## 2018-07-06 ENCOUNTER — Other Ambulatory Visit: Payer: Self-pay

## 2018-07-06 NOTE — Patient Outreach (Signed)
Second attempt to obtain mRs. No answer. Left message for return call.  

## 2018-07-13 ENCOUNTER — Other Ambulatory Visit: Payer: Self-pay

## 2018-07-13 NOTE — Patient Outreach (Signed)
3 outreach attempts were completed to obtain mRs. mRs could not be obtained because patient never returned my calls. mRs=7 

## 2019-01-20 ENCOUNTER — Emergency Department (HOSPITAL_COMMUNITY)
Admission: EM | Admit: 2019-01-20 | Discharge: 2019-01-21 | Disposition: A | Discharge status: Home / Self Care | Payer: Medicare Other | Attending: Emergency Medicine | Admitting: Emergency Medicine

## 2019-01-20 ENCOUNTER — Encounter (HOSPITAL_COMMUNITY): Payer: Self-pay | Admitting: Emergency Medicine

## 2019-01-20 ENCOUNTER — Other Ambulatory Visit: Payer: Self-pay

## 2019-01-20 ENCOUNTER — Emergency Department (HOSPITAL_COMMUNITY): Payer: Medicare Other

## 2019-01-20 DIAGNOSIS — Z8673 Personal history of transient ischemic attack (TIA), and cerebral infarction without residual deficits: Secondary | ICD-10-CM | POA: Insufficient documentation

## 2019-01-20 DIAGNOSIS — Z79899 Other long term (current) drug therapy: Secondary | ICD-10-CM | POA: Diagnosis not present

## 2019-01-20 DIAGNOSIS — Z794 Long term (current) use of insulin: Secondary | ICD-10-CM | POA: Diagnosis not present

## 2019-01-20 DIAGNOSIS — R8271 Bacteriuria: Secondary | ICD-10-CM | POA: Insufficient documentation

## 2019-01-20 DIAGNOSIS — M79605 Pain in left leg: Secondary | ICD-10-CM | POA: Insufficient documentation

## 2019-01-20 DIAGNOSIS — Z7982 Long term (current) use of aspirin: Secondary | ICD-10-CM | POA: Insufficient documentation

## 2019-01-20 DIAGNOSIS — I10 Essential (primary) hypertension: Secondary | ICD-10-CM | POA: Diagnosis present

## 2019-01-20 DIAGNOSIS — R52 Pain, unspecified: Secondary | ICD-10-CM

## 2019-01-20 DIAGNOSIS — E119 Type 2 diabetes mellitus without complications: Secondary | ICD-10-CM | POA: Diagnosis not present

## 2019-01-20 DIAGNOSIS — Z7902 Long term (current) use of antithrombotics/antiplatelets: Secondary | ICD-10-CM | POA: Insufficient documentation

## 2019-01-20 LAB — URINALYSIS, ROUTINE W REFLEX MICROSCOPIC
Bilirubin Urine: NEGATIVE
Glucose, UA: NEGATIVE mg/dL
Ketones, ur: NEGATIVE mg/dL
Nitrite: NEGATIVE
Protein, ur: NEGATIVE mg/dL
Specific Gravity, Urine: 1.011 (ref 1.005–1.030)
pH: 7 (ref 5.0–8.0)

## 2019-01-20 LAB — COMPREHENSIVE METABOLIC PANEL
ALT: 25 U/L (ref 0–44)
AST: 17 U/L (ref 15–41)
Albumin: 4.2 g/dL (ref 3.5–5.0)
Alkaline Phosphatase: 97 U/L (ref 38–126)
Anion gap: 10 (ref 5–15)
BUN: 14 mg/dL (ref 8–23)
CO2: 24 mmol/L (ref 22–32)
Calcium: 9.2 mg/dL (ref 8.9–10.3)
Chloride: 101 mmol/L (ref 98–111)
Creatinine, Ser: 0.85 mg/dL (ref 0.61–1.24)
GFR calc Af Amer: >60 mL/min (ref >60)
GFR calc non Af Amer: >60 mL/min (ref >60)
Glucose, Bld: 170 mg/dL — ABNORMAL HIGH (ref 70–99)
Potassium: 3.6 mmol/L (ref 3.5–5.1)
Sodium: 135 mmol/L (ref 135–145)
Total Bilirubin: 0.6 mg/dL (ref 0.3–1.2)
Total Protein: 7.4 g/dL (ref 6.5–8.1)

## 2019-01-20 LAB — CBC WITH DIFFERENTIAL/PLATELET
Abs Immature Granulocytes: 0.01 10*3/uL (ref 0.00–0.07)
Basophils Absolute: 0 10*3/uL (ref 0.0–0.1)
Basophils Relative: 1 %
Eosinophils Absolute: 0.2 10*3/uL (ref 0.0–0.5)
Eosinophils Relative: 3 %
HCT: 47 % (ref 39.0–52.0)
Hemoglobin: 15.3 g/dL (ref 13.0–17.0)
Immature Granulocytes: 0 %
Lymphocytes Relative: 20 %
Lymphs Abs: 1.2 10*3/uL (ref 0.7–4.0)
MCH: 30.7 pg (ref 26.0–34.0)
MCHC: 32.6 g/dL (ref 30.0–36.0)
MCV: 94.4 fL (ref 80.0–100.0)
Monocytes Absolute: 0.4 10*3/uL (ref 0.1–1.0)
Monocytes Relative: 7 %
Neutro Abs: 4.4 10*3/uL (ref 1.7–7.7)
Neutrophils Relative %: 69 %
Platelets: 223 10*3/uL (ref 150–400)
RBC: 4.98 MIL/uL (ref 4.22–5.81)
RDW: 12.9 % (ref 11.5–15.5)
WBC: 6.3 10*3/uL (ref 4.0–10.5)
nRBC: 0 % (ref 0.0–0.2)

## 2019-01-20 LAB — CK: Total CK: 73 U/L (ref 49–397)

## 2019-01-20 MED ORDER — CEPHALEXIN 500 MG PO CAPS: 500.0000 mg | ORAL_CAPSULE | Freq: Two times a day (BID) | ORAL | 0 refills | Status: AC

## 2019-01-20 NOTE — ED Triage Notes (Signed)
Per EMS. Pt from home, called out for high blood pressure (over 200).  Family stays he frequently goes high but usually drops back down after medication.  Pt got pm meds two hours ago.  Also complains of left leg pain starting tonight, no obvious injuries or sores.  Pt had stroke last November - baseline now nonverbal and right sided weakness.

## 2019-01-20 NOTE — ED Notes (Signed)
Patient transported to X-ray 

## 2019-01-20 NOTE — ED Provider Notes (Signed)
[facility] EMERGENCY DEPARTMENT Provider Note   CSN: [id number] Arrival date & time: 01/20/19  2047     History   Chief Complaint Chief Complaint  Patient presents with   Blood Pressure Check   Leg Pain    HPI Douglas [name] is a 68 y.o. male with a past medical history of DM, hypertension, CVA with residual right-sided deficits who presents emergency department with concerns for hypertension and left leg pain.  Patient's daughter provides history as patient is nonverbal at baseline.  Daughter reports she is patient's primary caregiver.  Daughter reports patient is nonverbal and has residual right-sided deficits from his CVA.  Daughter reports she was checking his blood pressure today and noticed it was very elevated with a systolic blood pressure over 200.  She gave patient his evening blood pressure medications however pressures were still elevated about 1 hour later so she called EMS to bring him to the emergency department for further evaluation.  Daughter also reports patient had been moving his left leg flexing and extending at the knee and seeming to touch his thigh more often today and when she asked him if it hurt he nodded yes.  Daughter denies any recent trauma to the leg or falls.  Daughter has not noticed any wounds or skin changes.  Daughter denies any recent fevers, increased work of breathing, vomiting, change in appetite, or difficulty urinating.  Daughter reports he is at his neurologic baseline and has not had any changes in his neurologic status recently.     The history is provided by a relative.    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   Acute lower UTI    Type 2 diabetes mellitus with peripheral neuropathy (HCC)    Acute embolic stroke ([place])    Diabetes 1.5, managed as type 2 ([place])    Essential hypertension    Dyslipidemia    History of CVA with residual deficit    Dysphagia, post-stroke     Past Surgical History:  Procedure Laterality Date   IR PATIENT EVAL TECH 0-60 MINS  03/30/2018   TEE WITHOUT CARDIOVERSION N/A 04/02/2018   Procedure: TRANSESOPHAGEAL ECHOCARDIOGRAM (TEE);  Surgeon: Skeet Latch, MD;  Location: [facility];  Service: Cardiovascular;  Laterality: N/A;        Home Medications    Prior to Admission medications   Medication Sig Start Date End Date Taking? Authorizing Provider  ACCU-CHEK AVIVA PLUS test strip daily. 12/28/18  Yes [provider]  amLODipine (NORVASC) 10 MG tablet Take 1 tablet (10 mg total) by mouth daily. 05/04/18  Yes Angiulli, Lavon Paganini, PA-C  aspirin EC 325 MG EC tablet Take 1 tablet (325 mg total) by mouth daily. 04/04/18  Yes Donzetta Starch, NP  atorvastatin (LIPITOR) 20 MG tablet Take 1 tablet (20 mg total) by mouth at bedtime. 05/04/18  Yes Angiulli, Lavon Paganini, PA-C  baclofen (LIORESAL) 10 MG tablet Take 10 mg by mouth 4 (four)  times daily. 12/13/18  Yes [provider]  clopidogrel (PLAVIX) 75 MG tablet Take 1 tablet (75 mg total) by mouth daily. 05/04/18  Yes Angiulli, Mcarthur Rossetti, PA-C  glipiZIDE (GLUCOTROL) 10 MG tablet Take 1 tablet (10 mg total) by mouth 2 (two) times daily. 05/04/18  Yes Angiulli, Mcarthur Rossetti, PA-C  hydrALAZINE (APRESOLINE) 25 MG tablet Take 25 mg by mouth every 8 (eight) hours. Take with hydralazine 50 mg (75mg  total) 01/05/19  Yes [provider]  hydrALAZINE (APRESOLINE) 50 MG tablet Take 1 tablet (50 mg total) by mouth 3 (three) times daily. Patient taking differently:  Take 50 mg by mouth every 8 (eight) hours. Take with hydralazine 25mg  (75mg  total) 05/04/18  Yes Angiulli, Mcarthur Rossetti, PA-C  insulin detemir (LEVEMIR) 100 UNIT/ML injection Inject 0.25 mLs (25 Units total) into the skin 2 (two) times daily. Patient taking differently: Inject 16 Units into the skin as needed (if blood pressure is above 125).  05/04/18  Yes Angiulli, Mcarthur Rossetti, PA-C  labetalol (NORMODYNE) 200 MG tablet Take 200 mg by mouth as needed (give as an emergency if blood pressure is too high).    Yes [provider]  lamoTRIgine (LAMICTAL) 100 MG tablet Take 100 mg by mouth 2 (two) times daily. 11/18/18  Yes [provider]  lisinopril (ZESTRIL) 20 MG tablet Take 20 mg by mouth 2 (two) times daily. 11/18/18  Yes [provider]  metoprolol succinate (TOPROL-XL) 50 MG 24 hr tablet Take 1 tablet (50 mg total) by mouth daily. Patient taking differently: Take 25 mg by mouth at bedtime.  05/04/18  Yes Angiulli, Mcarthur Rossetti, PA-C  NOVOLOG FLEXPEN 100 UNIT/ML FlexPen Inject 3 Units into the skin See admin instructions.  If blood suger is above 200; per sliding scale. 10/01/18  Yes [provider]  pantoprazole (PROTONIX) 40 MG tablet Take 1 tablet (40 mg total) by mouth daily. 05/04/18  Yes Angiulli, Mcarthur Rossetti, PA-C  tamsulosin (FLOMAX) 0.4 MG CAPS capsule Take 0.4 mg by mouth daily. 09/30/18  Yes [provider]  cephALEXin (KEFLEX) 500 MG capsule Take 1 capsule (500 mg total) by mouth 2 (two) times daily for 7 days. 01/20/19 01/27/19  Ignacia Palma, MD  cloNIDine (CATAPRES) 0.2 MG tablet Take 1 tablet (0.2 mg total) by mouth 2 (two) times daily. Patient not taking: Reported on 01/20/2019 05/04/18   Angiulli, Mcarthur Rossetti, PA-C  Maltodextrin-Xanthan Gum (RESOURCE THICKENUP CLEAR) POWD Take 120 g by mouth as needed (nectar thick liquid consistency). Patient not taking: Reported on 01/20/2019 04/04/18   Layne Benton, NP  methylphenidate (RITALIN) 10 MG tablet Take 1 tablet (10  mg total) by mouth 2 (two) times daily with breakfast and lunch. Patient not taking: Reported on 01/20/2019 05/04/18   Angiulli, Mcarthur Rossetti, PA-C    Family History No family history on file.  Social History Social History   Tobacco Use   Smoking status: Never Smoker   Smokeless tobacco: Never Used  Substance Use Topics   Alcohol use: Not Currently   Drug use: Never     Allergies   Ciprofloxacin   Review of Systems Review of Systems  Unable to perform ROS: Patient nonverbal     Physical Exam Updated Vital Signs BP (!) 163/87    Pulse 85    Temp 98.2 F (36.8 C) (Oral)    Resp 17    Ht 6\' 1"  (1.854 m)    Wt 86.2 kg    SpO2 100%  BMI 25.07 kg/m   Physical Exam Constitutional:      General: He is not in acute distress. HENT:     Head: Normocephalic.     Right Ear: External ear normal.     Left Ear: External ear normal.     Nose: Nose normal.     Mouth/Throat:     Mouth: Mucous membranes are moist.     Pharynx: Oropharynx is clear.  Eyes:     Pupils: Pupils are equal, round, and reactive to light.  Neck:     Musculoskeletal: Neck supple.  Cardiovascular:     Rate and Rhythm: Normal rate and regular rhythm.     Pulses: Normal pulses.     Comments: 2+DP pulses bilaterally Pulmonary:     Effort: No respiratory distress.     Breath sounds: Normal breath sounds. No wheezing or rhonchi.  Chest:     Chest wall: No tenderness.  Abdominal:     Palpations: Abdomen is soft.     Tenderness: There is no abdominal tenderness. There is no guarding.  Musculoskeletal: Normal range of motion.        General: No swelling, tenderness (left leg), deformity or signs of injury.     Right lower leg: No edema.     Left lower leg: No edema.  Skin:    General: Skin is warm and dry.     Findings: No bruising or erythema.  Neurological:     Mental Status: He is alert. Mental status is at baseline.     GCS: GCS eye subscore is 4. GCS verbal subscore is 5. GCS motor subscore is 6.      Motor: Weakness (R sided chronic deficits) and atrophy (R side) present.     Comments: Patient follows commands      ED Treatments / Results  Labs (all labs ordered are listed, but only abnormal results are displayed) Labs Reviewed  COMPREHENSIVE METABOLIC PANEL - Abnormal; Notable for the following components:      Result Value   Glucose, Bld 170 (*)    All other components within normal limits  URINALYSIS, ROUTINE W REFLEX MICROSCOPIC - Abnormal; Notable for the following components:   Color, Urine STRAW (*)    Hgb urine dipstick SMALL (*)    Leukocytes,Ua TRACE (*)    Bacteria, UA RARE (*)    All other components within normal limits  URINE CULTURE  CBC WITH DIFFERENTIAL/PLATELET  CK    EKG EKG Interpretation  Date/Time:  Sunday January 20 2019 20:54:56 EDT Ventricular Rate:  82 PR Interval:    QRS Duration: 88 QT Interval:  365 QTC Calculation: 427 R Axis:   61 Text Interpretation:  Sinus rhythm Confirmed by Blane OharaZavitz, Joshua [phone]([id number]) on 01/20/2019 11:05:37 PM   Radiology Dg Pelvis 1-2 Views  Result Date: 01/20/2019 CLINICAL DATA:  Left leg pain. EXAM: PELVIS - 1-2 VIEW COMPARISON:  None. FINDINGS: The cortical margins of the bony pelvis are intact. No fracture. Age related degenerative change of both acetabula. Pubic symphysis and sacroiliac joints are congruent. Both femoral heads are well-seated in the respective acetabula. IMPRESSION: Age related degenerative change of both acetabula. No acute abnormality. Electronically Signed   By: Narda RutherfordMelanie  Sanford M.D.   On: 01/20/2019 22:37   Dg Femur Min 2 Views Left  Result Date: 01/20/2019 CLINICAL DATA:  Left leg pain. EXAM: LEFT FEMUR 2 VIEWS COMPARISON:  None. FINDINGS: Cortical margins of the left femur are intact. There is no evidence of fracture or  other focal bone lesions. Mild smooth periosteal thickening of the medial mid femoral shaft. Soft tissues are unremarkable. IMPRESSION: Mild smooth periosteal thickening  of the medial mid femoral shaft is nonspecific, but likely chronic. This can be seen with chronic venous stasis. Electronically Signed   By: Narda RutherfordMelanie  Sanford M.D.   On: 01/20/2019 22:35    Procedures Procedures (including critical care time)  Medications Ordered in ED Medications - No data to display   Initial Impression / Assessment and Plan / ED Course  I have reviewed the triage vital signs and the nursing notes.  Pertinent labs & imaging results that were available during my care of the patient were reviewed by me and considered in my medical decision making (see chart for details).       Patient's blood pressure in the 160 systolic on my evaluation.  Concern for possible hypertensive urgency plan to obtain laboratory studies to further assess.  Patient's left lower extremity has intact DP pulses and is warm, no overlying skin changes and soft compartments.  Patient does not have any swelling to his lower extremity, low suspicion for DVT at this time.  Patient is able to range his leg without difficulty and is nontender to palpation.  While I was in the room patient rubbing at his leg which daughter reports is an indication that he has to urinate.  Patient provided with urinal.  Urine shows small amount of leukocyte esterase and some bacteria.  Concerned that patient may have some dysuria and may be an early urinary tract infection however patient is nonverbal and unable to tell us may be rubbing at his leg to indicate this discomfort with urination.  X-rays obtained of the left lower extremity unrevealing.  EKG without acute ischemic changes and laboratory studies unrevealing.  Patient's blood pressure improved on its own after patient took home blood pressure medications at home.  Discussed urine and other results with patient and family.  Plan to send urine culture.  Patient family provided with a prescription for course of Keflex to fill if urine culture comes back positive for him if his  symptoms worsen.  All questions answered and strict return cautions given.  Patient family comfortable plan to discharge home, continue supportive care and home blood pressure medicines, and closely follow-up with primary care provider.  Patient seen and plan discussed with Dr. Jodi MourningZavitz.  Final Clinical Impressions(s) / ED Diagnoses   Final diagnoses:  Hypertension, unspecified type  Left leg pain  Bacteria in urine    ED Discharge Orders         Ordered    cephALEXin (KEFLEX) 500 MG capsule  2 times daily     01/20/19 2328           Ignacia PalmaKuefler, Savanna Dooley S, MD 01/21/19 [id number]    Blane OharaZavitz, Joshua, MD 01/22/19 [phone]

## 2019-01-21 NOTE — ED Notes (Signed)
Report given to PTAR 

## 2019-01-23 LAB — URINE CULTURE: Culture: >=100000 — AB

## 2019-01-24 ENCOUNTER — Telehealth: Payer: Self-pay

## 2019-01-24 NOTE — Telephone Encounter (Signed)
Post ED Visit - Positive Culture Follow-up  Culture report reviewed by antimicrobial stewardship pharmacist: [place] Team []  Elenor Quinones, Pharm.D. []  Heide Guile, Pharm.D., BCPS AQ-ID []  Parks Neptune, Pharm.D., BCPS []  Alycia Rossetti, Pharm.D., BCPS []  [place], Pharm.D., BCPS, AAHIVP []  Legrand Como, Pharm.D., BCPS, AAHIVP []  Salome Arnt, PharmD, BCPS []  Johnnette Gourd, PharmD, BCPS []  Hughes Better, PharmD, BCPS []  Leeroy Cha, PharmD []  Laqueta Linden, PharmD, BCPS []  Albertina Parr, [place] Team []  Leodis Sias, PharmD []  Lindell Spar, PharmD []  Royetta Asal, PharmD []  Graylin Shiver, Rph []  Rema Fendt) Glennon Mac, PharmD []  Arlyn Dunning, PharmD []  Netta Cedars, PharmD []  Dia Sitter, PharmD []  Leone Haven, PharmD []  Gretta Arab, PharmD []  Theodis Shove, PharmD []  Peggyann Juba, PharmD []  Reuel Boom, PharmD   Positive urine culture Treated with Cephalexin, organism sensitive to the same and no further patient follow-up is required at this time.  Genia Del 01/24/2019, 10:52 AM

## 2019-02-20 ENCOUNTER — Encounter (HOSPITAL_COMMUNITY): Payer: Self-pay | Admitting: Emergency Medicine

## 2019-02-20 ENCOUNTER — Emergency Department (HOSPITAL_COMMUNITY)
Admission: EM | Admit: 2019-02-20 | Discharge: 2019-02-20 | Discharge status: Against Medical Advice | Payer: Medicare Other | Attending: Emergency Medicine | Admitting: Emergency Medicine

## 2019-02-20 ENCOUNTER — Emergency Department (HOSPITAL_BASED_OUTPATIENT_CLINIC_OR_DEPARTMENT_OTHER): Payer: Medicare Other

## 2019-02-20 DIAGNOSIS — E119 Type 2 diabetes mellitus without complications: Secondary | ICD-10-CM | POA: Diagnosis not present

## 2019-02-20 DIAGNOSIS — Z79899 Other long term (current) drug therapy: Secondary | ICD-10-CM | POA: Diagnosis not present

## 2019-02-20 DIAGNOSIS — I69351 Hemiplegia and hemiparesis following cerebral infarction affecting right dominant side: Secondary | ICD-10-CM | POA: Insufficient documentation

## 2019-02-20 DIAGNOSIS — M79661 Pain in right lower leg: Secondary | ICD-10-CM | POA: Insufficient documentation

## 2019-02-20 DIAGNOSIS — R609 Edema, unspecified: Secondary | ICD-10-CM

## 2019-02-20 DIAGNOSIS — Z7982 Long term (current) use of aspirin: Secondary | ICD-10-CM | POA: Insufficient documentation

## 2019-02-20 DIAGNOSIS — Z7902 Long term (current) use of antithrombotics/antiplatelets: Secondary | ICD-10-CM | POA: Insufficient documentation

## 2019-02-20 DIAGNOSIS — N3090 Cystitis, unspecified without hematuria: Secondary | ICD-10-CM | POA: Insufficient documentation

## 2019-02-20 DIAGNOSIS — I10 Essential (primary) hypertension: Secondary | ICD-10-CM | POA: Diagnosis not present

## 2019-02-20 LAB — CBC
HCT: 42.1 % (ref 39.0–52.0)
Hemoglobin: 13.6 g/dL (ref 13.0–17.0)
MCH: 30.4 pg (ref 26.0–34.0)
MCHC: 32.3 g/dL (ref 30.0–36.0)
MCV: 94 fL (ref 80.0–100.0)
Platelets: 182 10*3/uL (ref 150–400)
RBC: 4.48 MIL/uL (ref 4.22–5.81)
RDW: 13.2 % (ref 11.5–15.5)
WBC: 6.6 10*3/uL (ref 4.0–10.5)
nRBC: 0 % (ref 0.0–0.2)

## 2019-02-20 LAB — CBG MONITORING, ED
Glucose-Capillary: 211 mg/dL — ABNORMAL HIGH (ref 70–99)
Glucose-Capillary: 170 mg/dL — ABNORMAL HIGH (ref 70–99)

## 2019-02-20 LAB — APTT: aPTT: 36 seconds (ref 24–36)

## 2019-02-20 LAB — URINALYSIS, ROUTINE W REFLEX MICROSCOPIC
Bilirubin Urine: NEGATIVE
Glucose, UA: 50 mg/dL — AB
Ketones, ur: NEGATIVE mg/dL
Nitrite: NEGATIVE
Protein, ur: NEGATIVE mg/dL
Specific Gravity, Urine: 1.016 (ref 1.005–1.030)
WBC, UA: >50 WBC/hpf — ABNORMAL HIGH (ref 0–5)
pH: 6 (ref 5.0–8.0)

## 2019-02-20 LAB — BASIC METABOLIC PANEL
Anion gap: 7 (ref 5–15)
BUN: 15 mg/dL (ref 8–23)
CO2: 27 mmol/L (ref 22–32)
Calcium: 9.1 mg/dL (ref 8.9–10.3)
Chloride: 106 mmol/L (ref 98–111)
Creatinine, Ser: 0.92 mg/dL (ref 0.61–1.24)
GFR calc Af Amer: >60 mL/min (ref >60)
GFR calc non Af Amer: >60 mL/min (ref >60)
Glucose, Bld: 251 mg/dL — ABNORMAL HIGH (ref 70–99)
Potassium: 3.7 mmol/L (ref 3.5–5.1)
Sodium: 140 mmol/L (ref 135–145)

## 2019-02-20 LAB — PROTIME-INR
INR: 1 (ref 0.8–1.2)
Prothrombin Time: 13.4 seconds (ref 11.4–15.2)

## 2019-02-20 MED ORDER — LIDOCAINE HCL (PF) 1 % IJ SOLN
INTRAMUSCULAR | Status: AC
  Administered 2019-02-20: 23:00:00 5 mL
  Filled 2019-02-20: qty 5

## 2019-02-20 MED ORDER — CEPHALEXIN 500 MG PO CAPS: 500.0000 mg | ORAL_CAPSULE | Freq: Four times a day (QID) | ORAL | 0 refills | Status: DC

## 2019-02-20 MED ORDER — CEFTRIAXONE SODIUM 1 G IJ SOLR
500.0000 mg | Freq: Once | INTRAMUSCULAR | Status: AC
  Administered 2019-02-20: 500 mg via INTRAMUSCULAR
  Filled 2019-02-20 (×2): qty 10

## 2019-02-20 NOTE — ED Notes (Signed)
Unable to obtain blood at this time. ED RN attempted x2, this RN attempted x1 without success. Phlebotomy notified, will attempt as well.

## 2019-02-20 NOTE — ED Triage Notes (Signed)
Pt arrives via EMS from home with daughter as main caregiver. Daughter reports pt has had right leg pain that is worsening. Pt's baseline is nonverbal and right sided weakness, pt is bedbound.

## 2019-02-20 NOTE — Discharge Instructions (Addendum)
You might have a UTI.  Please take the antibiotics. Ultrasound of your leg did not show any blood clot.  At your request, neurology follow-up has been also provided.  Please call them for the next available appointment.

## 2019-02-20 NOTE — ED Notes (Signed)
ED Provider at bedside. 

## 2019-02-20 NOTE — Progress Notes (Signed)
Lower extremity venous has been completed.   Preliminary results in CV Proc.   [name] 02/20/2019 6:20 PM

## 2019-02-20 NOTE — ED Notes (Signed)
Pt nonverbal, daugter has verbalized understanding of discharge instructions. Prescriptions, and follow up care. Time allotted for teachback and questions

## 2019-02-21 NOTE — ED Provider Notes (Signed)
[facility] EMERGENCY DEPARTMENT Provider Note   CSN: [id number] Arrival date & time: 02/20/19  1446     History   Chief Complaint Chief Complaint  Patient presents with  . Leg Pain    HPI Douglas [name] is a 68 y.o. male.     HPI  68 year old male comes in a chief complaint of leg pain. Patient has history of diabetes, hypertension, stroke.   Level 5 caveat for difficulty in communicating due to stroke related aphasia.  Daughter at bedside provides meaningful history.  According to the patient's daughter patient started screaming all of a sudden and was pointing to his leg.  She had brought him with leg pain earlier and is concerned that there could be something going on.  He is now calm and collected.  He has history of UTI.  He has hard time communicating because of aphasia.  Review of systems negative for nausea, vomiting, fevers, chills.  Past Medical History:  Diagnosis Date  . Diabetes mellitus without complication (HCC)   . Hypertension   . Stroke [facility])     Patient Active Problem List   Diagnosis Date Noted  . Acute blood loss anemia   . Labile blood pressure   . 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 Labile blood glucose   . Global aphasia   . Cerebral edema (HCC) 04/04/2018  . Renal insufficiency 04/04/2018  . 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 Acute lower UTI   . Type 2 diabetes mellitus with peripheral neuropathy (HCC)   . Acute embolic stroke (HCC)   . Diabetes 1.5, managed as type 2 (HCC)   . Essential hypertension   . Dyslipidemia   . History of CVA with residual deficit   . Dysphagia, post-stroke     Past Surgical History:  Procedure Laterality Date  . IR PATIENT EVAL TECH 0-60 MINS  03/30/2018  . TEE WITHOUT CARDIOVERSION N/A 04/02/2018   Procedure: TRANSESOPHAGEAL ECHOCARDIOGRAM (TEE);  Surgeon: [name], MD;  Location: [facility] ENDOSCOPY;   Service: Cardiovascular;  Laterality: N/A;        Home Medications    Prior to Admission medications   Medication Sig Start Date End Date Taking? Authorizing Provider  ACCU-CHEK AVIVA PLUS test strip daily. 12/28/18   [provider]  amLODipine (NORVASC) 10 MG tablet Take 1 tablet (10 mg total) by mouth daily. 05/04/18   Angiulli, Mcarthur Rossetti, PA-C  aspirin EC 325 MG EC tablet Take 1 tablet (325 mg total) by mouth daily. 04/04/18   Layne Benton, NP  atorvastatin (LIPITOR) 20 MG tablet Take 1 tablet (20 mg total) by mouth at bedtime. 05/04/18   Angiulli, Mcarthur Rossetti, PA-C  baclofen (LIORESAL) 10 MG tablet Take 10 mg by mouth 4 (four) times daily. 12/13/18   [provider]  cephALEXin (KEFLEX) 500 MG capsule Take 1 capsule (500 mg total) by mouth 4 (four) times daily. 02/20/19   Derwood Kaplan, MD  cloNIDine (CATAPRES) 0.2 MG tablet Take 1 tablet (0.2 mg total) by mouth 2 (two) times daily. Patient not taking: Reported on 01/20/2019 05/04/18   Angiulli, Mcarthur Rossetti, PA-C  clopidogrel (PLAVIX) 75 MG tablet Take 1 tablet (75 mg total) by mouth daily. 05/04/18   Angiulli, Mcarthur Rossetti, PA-C  glipiZIDE (GLUCOTROL) 10 MG tablet Take 1 tablet (10 mg total) by mouth 2 (two) times daily. 05/04/18   Angiulli, Mcarthur Rossetti, PA-C  hydrALAZINE (  APRESOLINE) 25 MG tablet Take 25 mg by mouth every 8 (eight) hours. Take with hydralazine 50 mg (75mg  total) 01/05/19   [provider]  hydrALAZINE (APRESOLINE) 50 MG tablet Take 1 tablet (50 mg total) by mouth 3 (three) times daily. Patient taking differently: Take 50 mg by mouth every 8 (eight) hours. Take with hydralazine 25mg  (75mg  total) 05/04/18   Angiulli, Mcarthur Rossettianiel J, PA-C  insulin detemir (LEVEMIR) 100 UNIT/ML injection Inject 0.25 mLs (25 Units total) into the skin 2 (two) times daily. Patient taking differently: Inject 16 Units into the skin as needed (if blood pressure is above 125).  05/04/18   Angiulli, Mcarthur Rossettianiel J, PA-C  labetalol (NORMODYNE) 200 MG tablet  Take 200 mg by mouth as needed (give as an emergency if blood pressure is too high).     [provider]  lamoTRIgine (LAMICTAL) 100 MG tablet Take 100 mg by mouth 2 (two) times daily. 11/18/18   [provider]  lisinopril (ZESTRIL) 20 MG tablet Take 20 mg by mouth 2 (two) times daily. 11/18/18   [provider]  Maltodextrin-Xanthan Gum (RESOURCE THICKENUP CLEAR) POWD Take 120 g by mouth as needed (nectar thick liquid consistency). Patient not taking: Reported on 01/20/2019 04/04/18   Layne BentonBiby, Sharon L, NP  methylphenidate (RITALIN) 10 MG tablet Take 1 tablet (10 mg total) by mouth 2 (two) times daily with breakfast and lunch. Patient not taking: Reported on 01/20/2019 05/04/18   Angiulli, Mcarthur Rossettianiel J, PA-C  metoprolol succinate (TOPROL-XL) 50 MG 24 hr tablet Take 1 tablet (50 mg total) by mouth daily. Patient taking differently: Take 25 mg by mouth at bedtime.  05/04/18   Angiulli, Mcarthur Rossettianiel J, PA-C  NOVOLOG FLEXPEN 100 UNIT/ML FlexPen Inject 3 Units into the skin See admin instructions.  If blood suger is above 200; per sliding scale. 10/01/18   [provider]  pantoprazole (PROTONIX) 40 MG tablet Take 1 tablet (40 mg total) by mouth daily. 05/04/18   Angiulli, Mcarthur Rossettianiel J, PA-C  tamsulosin (FLOMAX) 0.4 MG CAPS capsule Take 0.4 mg by mouth daily. 09/30/18   [provider]    Family History No family history on file.  Social History Social History   Tobacco Use  . Smoking status: Never Smoker  . Smokeless tobacco: Never Used  Substance Use Topics  . Alcohol use: Not Currently  . Drug use: Never     Allergies   Ciprofloxacin   Review of Systems Review of Systems  Unable to perform ROS: Patient nonverbal     Physical Exam Updated Vital Signs BP (!) 169/89   Pulse 88   Temp 98.3 F (36.8 C) (Oral)   Resp 14   SpO2 98%   Physical Exam Vitals signs and nursing note reviewed.  Constitutional:      Appearance: He is well-developed.  HENT:      Head: Atraumatic.  Neck:     Musculoskeletal: Neck supple.  Cardiovascular:     Rate and Rhythm: Normal rate.  Pulmonary:     Effort: Pulmonary effort is normal.  Abdominal:     Tenderness: There is no abdominal tenderness.  Musculoskeletal:     Comments: Lower extremity exam reveals right lower extremity unilateral swelling without calf tenderness.  Skin:    General: Skin is warm.  Neurological:     Mental Status: He is alert.      ED Treatments / Results  Labs (all labs ordered are listed, but only abnormal results are displayed) Labs Reviewed  BASIC  METABOLIC PANEL - Abnormal; Notable for the following components:      Result Value   Glucose, Bld 251 (*)    All other components within normal limits  URINALYSIS, ROUTINE W REFLEX MICROSCOPIC - Abnormal; Notable for the following components:   Glucose, UA 50 (*)    Hgb urine dipstick SMALL (*)    Leukocytes,Ua MODERATE (*)    WBC, UA >50 (*)    Bacteria, UA RARE (*)    All other components within normal limits  CBG MONITORING, ED - Abnormal; Notable for the following components:   Glucose-Capillary 211 (*)    All other components within normal limits  CBG MONITORING, ED - Abnormal; Notable for the following components:   Glucose-Capillary 170 (*)    All other components within normal limits  URINE CULTURE  CBC  APTT  PROTIME-INR    EKG None  Radiology Vas [place] Lower Extremity Venous (dvt) (only [facility])  Result Date: 02/20/2019  Lower Venous Study Indications: Edema.  Performing Technologist: Blanch Media RVS  Examination Guidelines: A complete evaluation includes B-mode imaging, spectral Doppler, color Doppler, and power Doppler as needed of all accessible portions of each vessel. Bilateral testing is considered an integral part of a complete examination. Limited examinations for reoccurring indications may be performed as noted.  +---------+---------------+---------+-----------+----------+--------------+ RIGHT     CompressibilityPhasicitySpontaneityPropertiesThrombus Aging +---------+---------------+---------+-----------+----------+--------------+ CFV      Full           Yes      Yes                                 +---------+---------------+---------+-----------+----------+--------------+ SFJ      Full                                                        +---------+---------------+---------+-----------+----------+--------------+ FV Prox  Full                                                        +---------+---------------+---------+-----------+----------+--------------+ FV Mid   Full                                                        +---------+---------------+---------+-----------+----------+--------------+ FV DistalFull                                                        +---------+---------------+---------+-----------+----------+--------------+ PFV      Full                                                        +---------+---------------+---------+-----------+----------+--------------+ POP  Full           Yes      Yes                                 +---------+---------------+---------+-----------+----------+--------------+ PTV      Full                                                        +---------+---------------+---------+-----------+----------+--------------+ PERO     Full                                                        +---------+---------------+---------+-----------+----------+--------------+     Summary: Right: There is no evidence of deep vein thrombosis in the lower extremity. No cystic structure found in the popliteal fossa.  *See table(s) above for measurements and observations.    Preliminary     Procedures Procedures (including critical care time)  Medications Ordered in ED Medications  cefTRIAXone (ROCEPHIN) injection 500 mg (500 mg Intramuscular Given 02/20/19 2243)  lidocaine (PF) (XYLOCAINE) 1 %  injection (5 mLs  Given 02/20/19 2243)     Initial Impression / Assessment and Plan / ED Course  I have reviewed the triage vital signs and the nursing notes.  Pertinent labs & imaging results that were available during my care of the patient were reviewed by me and considered in my medical decision making (see chart for details).        68 year old comes with chief complaint of leg pain. He has history of diabetes, stroke.  Abdominal exam is reassuring.  Lung exam is clear.  Hemodynamically patient is stable with vital signs within normal limit.  Ultrasound ordered to rule out DVT given the unilateral leg swelling over the right lower extremity and questionable pain.  No signs of cellulitis.  He had UTI last time he came.  Daughter reports that patient has had reduced urine output, and she is concerned he could be having infection again.  Abdominal exam is reassuring.  Labs ordered and they look fine. Daughter wanted to leave AMA, before the lab results came.  I called the patient's daughter and went over the lab results with her which are reassuring.   We have also consulted social work to contact the daughter to help out with home health.  Final Clinical Impressions(s) / ED Diagnoses   Final diagnoses:  Cystitis    ED Discharge Orders         Ordered    cephALEXin (KEFLEX) 500 MG capsule  4 times daily     02/20/19 2143           Varney Biles, MD 02/21/19 0010

## 2019-02-23 LAB — URINE CULTURE: Culture: >=100000 — AB

## 2019-02-24 ENCOUNTER — Telehealth: Payer: Self-pay | Admitting: Emergency Medicine

## 2019-02-24 NOTE — Telephone Encounter (Signed)
Post ED Visit - Positive Culture Follow-up  Culture report reviewed by antimicrobial stewardship pharmacist: [facility] Team []  Elenor Quinones, Pharm.D. []  Heide Guile, Pharm.D., BCPS AQ-ID []  Parks Neptune, Pharm.D., BCPS []  Alycia Rossetti, Pharm.D., BCPS []  Keller, Pharm.D., BCPS, AAHIVP []  Legrand Como, Pharm.D., BCPS, AAHIVP []  Salome Arnt, PharmD, BCPS []  Johnnette Gourd, PharmD, BCPS []  Hughes Better, PharmD, BCPS [x]  Elicia Lamp, PharmD []  Laqueta Linden, PharmD, BCPS []  Albertina Parr, PharmD  [facility] Team []  Leodis Sias, PharmD []  Lindell Spar, PharmD []  Royetta Asal, PharmD []  Graylin Shiver, Rph []  Rema Fendt) Glennon Mac, PharmD []  Arlyn Dunning, PharmD []  Netta Cedars, PharmD []  Dia Sitter, PharmD []  Leone Haven, PharmD []  Gretta Arab, PharmD []  Theodis Shove, PharmD []  Peggyann Juba, PharmD []  Reuel Boom, PharmD   Positive urine culture Treated with Cephalexin, organism sensitive to the same and no further patient follow-up is required at this time.  [name] 02/24/2019, 2:26 PM

## 2019-03-05 ENCOUNTER — Other Ambulatory Visit: Payer: Self-pay

## 2019-03-05 ENCOUNTER — Encounter (HOSPITAL_COMMUNITY): Payer: Self-pay

## 2019-03-05 ENCOUNTER — Emergency Department (HOSPITAL_COMMUNITY): Payer: Medicare Other

## 2019-03-05 ENCOUNTER — Inpatient Hospital Stay (HOSPITAL_COMMUNITY)
Admission: EM | Admit: 2019-03-05 | Discharge: 2019-03-12 | DRG: 040 | Disposition: A | Discharge status: Skilled Nursing Facility | Payer: Medicare Other | Attending: Family Medicine | Admitting: Family Medicine

## 2019-03-05 ENCOUNTER — Inpatient Hospital Stay (HOSPITAL_COMMUNITY): Payer: Medicare Other

## 2019-03-05 DIAGNOSIS — R29718 NIHSS score 18: Secondary | ICD-10-CM | POA: Diagnosis present

## 2019-03-05 DIAGNOSIS — Z7902 Long term (current) use of antithrombotics/antiplatelets: Secondary | ICD-10-CM | POA: Diagnosis not present

## 2019-03-05 DIAGNOSIS — I69351 Hemiplegia and hemiparesis following cerebral infarction affecting right dominant side: Secondary | ICD-10-CM

## 2019-03-05 DIAGNOSIS — R4701 Aphasia: Secondary | ICD-10-CM | POA: Diagnosis present

## 2019-03-05 DIAGNOSIS — I639 Cerebral infarction, unspecified: Secondary | ICD-10-CM

## 2019-03-05 DIAGNOSIS — R1312 Dysphagia, oropharyngeal phase: Secondary | ICD-10-CM | POA: Diagnosis present

## 2019-03-05 DIAGNOSIS — N390 Urinary tract infection, site not specified: Secondary | ICD-10-CM | POA: Diagnosis present

## 2019-03-05 DIAGNOSIS — E1165 Type 2 diabetes mellitus with hyperglycemia: Secondary | ICD-10-CM | POA: Diagnosis not present

## 2019-03-05 DIAGNOSIS — Z20828 Contact with and (suspected) exposure to other viral communicable diseases: Secondary | ICD-10-CM | POA: Diagnosis present

## 2019-03-05 DIAGNOSIS — R4182 Altered mental status, unspecified: Secondary | ICD-10-CM | POA: Diagnosis not present

## 2019-03-05 DIAGNOSIS — I69320 Aphasia following cerebral infarction: Secondary | ICD-10-CM

## 2019-03-05 DIAGNOSIS — G9341 Metabolic encephalopathy: Secondary | ICD-10-CM | POA: Diagnosis present

## 2019-03-05 DIAGNOSIS — B964 Proteus (mirabilis) (morganii) as the cause of diseases classified elsewhere: Secondary | ICD-10-CM | POA: Diagnosis present

## 2019-03-05 DIAGNOSIS — Z881 Allergy status to other antibiotic agents status: Secondary | ICD-10-CM

## 2019-03-05 DIAGNOSIS — N400 Enlarged prostate without lower urinary tract symptoms: Secondary | ICD-10-CM | POA: Diagnosis present

## 2019-03-05 DIAGNOSIS — R319 Hematuria, unspecified: Secondary | ICD-10-CM | POA: Diagnosis present

## 2019-03-05 DIAGNOSIS — Z8673 Personal history of transient ischemic attack (TIA), and cerebral infarction without residual deficits: Secondary | ICD-10-CM | POA: Diagnosis not present

## 2019-03-05 DIAGNOSIS — E1169 Type 2 diabetes mellitus with other specified complication: Secondary | ICD-10-CM | POA: Diagnosis present

## 2019-03-05 DIAGNOSIS — R569 Unspecified convulsions: Secondary | ICD-10-CM | POA: Diagnosis present

## 2019-03-05 DIAGNOSIS — Z794 Long term (current) use of insulin: Secondary | ICD-10-CM

## 2019-03-05 DIAGNOSIS — E1142 Type 2 diabetes mellitus with diabetic polyneuropathy: Secondary | ICD-10-CM | POA: Diagnosis present

## 2019-03-05 DIAGNOSIS — E1159 Type 2 diabetes mellitus with other circulatory complications: Secondary | ICD-10-CM | POA: Diagnosis present

## 2019-03-05 DIAGNOSIS — Z8249 Family history of ischemic heart disease and other diseases of the circulatory system: Secondary | ICD-10-CM | POA: Diagnosis not present

## 2019-03-05 DIAGNOSIS — I10 Essential (primary) hypertension: Secondary | ICD-10-CM

## 2019-03-05 DIAGNOSIS — E785 Hyperlipidemia, unspecified: Secondary | ICD-10-CM | POA: Diagnosis present

## 2019-03-05 DIAGNOSIS — Z7982 Long term (current) use of aspirin: Secondary | ICD-10-CM

## 2019-03-05 DIAGNOSIS — I6389 Other cerebral infarction: Secondary | ICD-10-CM

## 2019-03-05 DIAGNOSIS — H5347 Heteronymous bilateral field defects: Secondary | ICD-10-CM | POA: Diagnosis present

## 2019-03-05 DIAGNOSIS — R29810 Facial weakness: Secondary | ICD-10-CM | POA: Diagnosis present

## 2019-03-05 HISTORY — DX: Urinary tract infection, site not specified: N39.0

## 2019-03-05 HISTORY — DX: Cerebral infarction, unspecified: I63.9

## 2019-03-05 LAB — COMPREHENSIVE METABOLIC PANEL
ALT: 39 U/L (ref 0–44)
AST: 19 U/L (ref 15–41)
Albumin: 4 g/dL (ref 3.5–5.0)
Alkaline Phosphatase: 103 U/L (ref 38–126)
Anion gap: 14 (ref 5–15)
BUN: 17 mg/dL (ref 8–23)
CO2: 27 mmol/L (ref 22–32)
Calcium: 10 mg/dL (ref 8.9–10.3)
Chloride: 104 mmol/L (ref 98–111)
Creatinine, Ser: 1.12 mg/dL (ref 0.61–1.24)
GFR calc Af Amer: >60 mL/min (ref >60)
GFR calc non Af Amer: >60 mL/min (ref >60)
Glucose, Bld: 209 mg/dL — ABNORMAL HIGH (ref 70–99)
Potassium: 4 mmol/L (ref 3.5–5.1)
Sodium: 145 mmol/L (ref 135–145)
Total Bilirubin: 0.6 mg/dL (ref 0.3–1.2)
Total Protein: 7.2 g/dL (ref 6.5–8.1)

## 2019-03-05 LAB — DIFFERENTIAL
Abs Immature Granulocytes: 0.02 10*3/uL (ref 0.00–0.07)
Basophils Absolute: 0 10*3/uL (ref 0.0–0.1)
Basophils Relative: 0 %
Eosinophils Absolute: 0 10*3/uL (ref 0.0–0.5)
Eosinophils Relative: 0 %
Immature Granulocytes: 0 %
Lymphocytes Relative: 10 %
Lymphs Abs: 0.8 10*3/uL (ref 0.7–4.0)
Monocytes Absolute: 0.4 10*3/uL (ref 0.1–1.0)
Monocytes Relative: 5 %
Neutro Abs: 6.6 10*3/uL (ref 1.7–7.7)
Neutrophils Relative %: 85 %

## 2019-03-05 LAB — URINALYSIS, ROUTINE W REFLEX MICROSCOPIC
Bilirubin Urine: NEGATIVE
Glucose, UA: NEGATIVE mg/dL
Hgb urine dipstick: NEGATIVE
Ketones, ur: NEGATIVE mg/dL
Nitrite: NEGATIVE
Protein, ur: 30 mg/dL — AB
Specific Gravity, Urine: 1.02 (ref 1.005–1.030)
pH: 7 (ref 5.0–8.0)

## 2019-03-05 LAB — RAPID URINE DRUG SCREEN, HOSP PERFORMED
Amphetamines: NOT DETECTED
Barbiturates: NOT DETECTED
Benzodiazepines: NOT DETECTED
Cocaine: NOT DETECTED
Opiates: NOT DETECTED
Tetrahydrocannabinol: NOT DETECTED

## 2019-03-05 LAB — ETHANOL: Alcohol, Ethyl (B): <10 mg/dL (ref <10)

## 2019-03-05 LAB — I-STAT CHEM 8, ED
BUN: 20 mg/dL (ref 8–23)
Calcium, Ion: 1.32 mmol/L (ref 1.15–1.40)
Chloride: 104 mmol/L (ref 98–111)
Creatinine, Ser: 1 mg/dL (ref 0.61–1.24)
Glucose, Bld: 202 mg/dL — ABNORMAL HIGH (ref 70–99)
HCT: 46 % (ref 39.0–52.0)
Hemoglobin: 15.6 g/dL (ref 13.0–17.0)
Potassium: 3.9 mmol/L (ref 3.5–5.1)
Sodium: 144 mmol/L (ref 135–145)
TCO2: 28 mmol/L (ref 22–32)

## 2019-03-05 LAB — CBC
HCT: 45.3 % (ref 39.0–52.0)
HCT: 43.3 % (ref 39.0–52.0)
Hemoglobin: 14.4 g/dL (ref 13.0–17.0)
Hemoglobin: 13.8 g/dL (ref 13.0–17.0)
MCH: 29.6 pg (ref 26.0–34.0)
MCH: 29.6 pg (ref 26.0–34.0)
MCHC: 31.8 g/dL (ref 30.0–36.0)
MCHC: 31.9 g/dL (ref 30.0–36.0)
MCV: 93.2 fL (ref 80.0–100.0)
MCV: 92.9 fL (ref 80.0–100.0)
Platelets: 239 10*3/uL (ref 150–400)
Platelets: 231 10*3/uL (ref 150–400)
RBC: 4.86 MIL/uL (ref 4.22–5.81)
RBC: 4.66 MIL/uL (ref 4.22–5.81)
RDW: 13.2 % (ref 11.5–15.5)
RDW: 13.2 % (ref 11.5–15.5)
WBC: 7.8 10*3/uL (ref 4.0–10.5)
WBC: 7.4 10*3/uL (ref 4.0–10.5)
nRBC: 0 % (ref 0.0–0.2)
nRBC: 0 % (ref 0.0–0.2)

## 2019-03-05 LAB — PROTIME-INR
INR: 1.1 (ref 0.8–1.2)
Prothrombin Time: 13.7 seconds (ref 11.4–15.2)

## 2019-03-05 LAB — TROPONIN I (HIGH SENSITIVITY)
Troponin I (High Sensitivity): 5 ng/L (ref <18)
Troponin I (High Sensitivity): 5 ng/L (ref <18)

## 2019-03-05 LAB — CREATININE, SERUM
Creatinine, Ser: 0.94 mg/dL (ref 0.61–1.24)
GFR calc Af Amer: >60 mL/min (ref >60)
GFR calc non Af Amer: >60 mL/min (ref >60)

## 2019-03-05 LAB — APTT: aPTT: 21 seconds — ABNORMAL LOW (ref 24–36)

## 2019-03-05 LAB — HEMOGLOBIN A1C
Hgb A1c MFr Bld: 7.1 % — ABNORMAL HIGH (ref 4.8–5.6)
Mean Plasma Glucose: 157.07 mg/dL

## 2019-03-05 LAB — CBG MONITORING, ED
Glucose-Capillary: 153 mg/dL — ABNORMAL HIGH (ref 70–99)
Glucose-Capillary: 159 mg/dL — ABNORMAL HIGH (ref 70–99)
Glucose-Capillary: 131 mg/dL — ABNORMAL HIGH (ref 70–99)

## 2019-03-05 LAB — ECHOCARDIOGRAM COMPLETE
Height: 73 in
Weight: 2913.6 oz

## 2019-03-05 LAB — SARS CORONAVIRUS 2 (TAT 6-24 HRS): SARS Coronavirus 2: NEGATIVE

## 2019-03-05 MED ORDER — PANTOPRAZOLE SODIUM 40 MG PO TBEC
40.0000 mg | DELAYED_RELEASE_TABLET | Freq: Every day | ORAL | Status: DC
  Administered 2019-03-06 – 2019-03-12 (×7): 40 mg via ORAL
  Filled 2019-03-05 (×7): qty 1

## 2019-03-05 MED ORDER — CLOPIDOGREL BISULFATE 75 MG PO TABS
75.0000 mg | ORAL_TABLET | Freq: Every day | ORAL | Status: DC
  Administered 2019-03-06 – 2019-03-12 (×7): 75 mg via ORAL
  Filled 2019-03-05 (×7): qty 1

## 2019-03-05 MED ORDER — INSULIN ASPART 100 UNIT/ML ~~LOC~~ SOLN
0.0000 [IU] | Freq: Every day | SUBCUTANEOUS | Status: DC
  Administered 2019-03-06: 2 [IU] via SUBCUTANEOUS
  Administered 2019-03-08: 3 [IU] via SUBCUTANEOUS
  Administered 2019-03-09: 2 [IU] via SUBCUTANEOUS

## 2019-03-05 MED ORDER — LAMOTRIGINE 100 MG PO TABS
100.0000 mg | ORAL_TABLET | Freq: Two times a day (BID) | ORAL | Status: DC
  Administered 2019-03-06 – 2019-03-12 (×13): 100 mg via ORAL
  Filled 2019-03-05 (×13): qty 1

## 2019-03-05 MED ORDER — ASPIRIN 300 MG RE SUPP
300.0000 mg | Freq: Once | RECTAL | Status: AC
  Administered 2019-03-05: 300 mg via RECTAL
  Filled 2019-03-05: qty 1

## 2019-03-05 MED ORDER — TAMSULOSIN HCL 0.4 MG PO CAPS
0.4000 mg | ORAL_CAPSULE | Freq: Every day | ORAL | Status: DC
  Administered 2019-03-06 – 2019-03-11 (×6): 0.4 mg via ORAL
  Filled 2019-03-05 (×6): qty 1

## 2019-03-05 MED ORDER — STROKE: EARLY STAGES OF RECOVERY BOOK
Freq: Once | Status: DC
  Filled 2019-03-05: qty 1

## 2019-03-05 MED ORDER — ENOXAPARIN SODIUM 40 MG/0.4ML ~~LOC~~ SOLN
40.0000 mg | SUBCUTANEOUS | Status: DC
  Administered 2019-03-05 – 2019-03-10 (×6): 40 mg via SUBCUTANEOUS
  Filled 2019-03-05 (×6): qty 0.4

## 2019-03-05 MED ORDER — ATORVASTATIN CALCIUM 80 MG PO TABS
80.0000 mg | ORAL_TABLET | Freq: Every day | ORAL | Status: DC
  Administered 2019-03-06 – 2019-03-07 (×2): 80 mg via ORAL
  Filled 2019-03-05 (×2): qty 1

## 2019-03-05 MED ORDER — BACLOFEN 10 MG PO TABS
20.0000 mg | ORAL_TABLET | Freq: Two times a day (BID) | ORAL | Status: DC
  Administered 2019-03-06 – 2019-03-12 (×13): 20 mg via ORAL
  Filled 2019-03-05 (×13): qty 2

## 2019-03-05 MED ORDER — INSULIN ASPART 100 UNIT/ML ~~LOC~~ SOLN
0.0000 [IU] | Freq: Three times a day (TID) | SUBCUTANEOUS | Status: DC
  Administered 2019-03-05: 3 [IU] via SUBCUTANEOUS
  Administered 2019-03-06: 2 [IU] via SUBCUTANEOUS
  Administered 2019-03-06: 5 [IU] via SUBCUTANEOUS
  Administered 2019-03-06 – 2019-03-07 (×2): 3 [IU] via SUBCUTANEOUS
  Administered 2019-03-07: 17:00:00 8 [IU] via SUBCUTANEOUS
  Administered 2019-03-07 – 2019-03-08 (×3): 3 [IU] via SUBCUTANEOUS
  Administered 2019-03-08: 5 [IU] via SUBCUTANEOUS
  Administered 2019-03-09 – 2019-03-10 (×5): 3 [IU] via SUBCUTANEOUS
  Administered 2019-03-10 – 2019-03-12 (×5): 5 [IU] via SUBCUTANEOUS
  Administered 2019-03-12: 3 [IU] via SUBCUTANEOUS

## 2019-03-05 MED ORDER — SODIUM CHLORIDE 0.9 % IV SOLN
1.0000 g | Freq: Once | INTRAVENOUS | Status: AC
  Administered 2019-03-05: 1 g via INTRAVENOUS
  Filled 2019-03-05: qty 10

## 2019-03-05 MED ORDER — ACETAMINOPHEN 650 MG RE SUPP: 650.0000 mg | RECTAL | Status: DC | PRN

## 2019-03-05 MED ORDER — SODIUM CHLORIDE 0.9 % IV SOLN
1.0000 g | INTRAVENOUS | Status: AC
  Administered 2019-03-07 – 2019-03-10 (×4): 1 g via INTRAVENOUS
  Filled 2019-03-05 (×5): qty 10

## 2019-03-05 MED ORDER — SODIUM CHLORIDE 0.9 % IV BOLUS
500.0000 mL | Freq: Once | INTRAVENOUS | Status: AC
  Administered 2019-03-05: 16:00:00 500 mL via INTRAVENOUS

## 2019-03-05 MED ORDER — ACETAMINOPHEN 160 MG/5ML PO SOLN: 650.0000 mg | ORAL | Status: DC | PRN

## 2019-03-05 MED ORDER — ACETAMINOPHEN 325 MG PO TABS
650.0000 mg | ORAL_TABLET | ORAL | Status: DC | PRN
  Administered 2019-03-06 – 2019-03-11 (×2): 650 mg via ORAL
  Filled 2019-03-05 (×2): qty 2

## 2019-03-05 NOTE — ED Notes (Signed)
Pt arrived to Rm from MRI. Family member at bedside.

## 2019-03-05 NOTE — ED Notes (Addendum)
[facility] called back and advised they want pt's BP to be elevated and to call if gets above 837/290 systolic. Daughter present and is aware.

## 2019-03-05 NOTE — ED Notes (Signed)
ED TO INPATIENT HANDOFF REPORT  ED Nurse Name and Phone #: Kriste Basque [phone]  S Name/Age/Gender Douglas [name] 68 y.o. male Room/Bed: 054C/054C  Code Status   Code Status: Full Code  Home/SNF/Other Home Patient oriented to: self Is this baseline? Yes   Triage Complete: Triage complete  Chief Complaint Near Syncope  Triage Note Pt from home via ems; called out for near syncopal episode this am while eating breakfast; emesis x 1; hx stroke, residual deficits to R side and facial droop; incontinent, which family states is unusual; ems states repeated "yes" to questions means "no"; can normally assist with sitting and feeding; per family, last stoke pt had similar symptoms  155/86 P 96 100% RA T 98.1 F CBG 231   Allergies Allergies  Allergen Reactions  . Ciprofloxacin Anaphylaxis  . Other Nausea And Vomiting and Other (See Comments)    [place]    Level of Care/Admitting Diagnosis ED Disposition    ED Disposition Condition Comment   Admit  Hospital Area: [facility] [[id number]]  Level of Care: Telemetry Medical [104]  Covid Evaluation: Asymptomatic Screening Protocol (No Symptoms)  Diagnosis: Ischemic stroke [facility]) [[id number]]  Admitting Physician: Ollen Bowl [[id number]]  Attending Physician: Ollen Bowl [phone]  Estimated length of stay: past midnight tomorrow  Certification:: I certify this patient will need inpatient services for at least 2 midnights  PT Class (Do Not Modify): Inpatient [101]  PT Acc Code (Do Not Modify): Private [1]       B Medical/Surgery History Past Medical History:  Diagnosis Date  . Diabetes mellitus without complication (HCC)   . Hypertension   . Stroke [facility])    Past Surgical History:  Procedure Laterality Date  . IR PATIENT EVAL TECH 0-60 MINS  03/30/2018  . TEE WITHOUT CARDIOVERSION N/A 04/02/2018   Procedure: TRANSESOPHAGEAL ECHOCARDIOGRAM (TEE);  Surgeon: [name], MD;  Location: [facility]  ENDOSCOPY;  Service: Cardiovascular;  Laterality: N/A;     A IV Location/Drains/Wounds Patient Lines/Drains/Airways Status   Active Line/Drains/Airways    Name:   Placement date:   Placement time:   Site:   Days:   Peripheral IV 03/05/19 Anterior;Left Forearm   03/05/19    1403    Forearm   less than 1          Intake/Output Last 24 hours  Intake/Output Summary (Last 24 hours) at 03/05/2019 2217 Last data filed at 03/05/2019 2006 Gross per 24 hour  Intake 600.12 ml  Output 2 ml  Net 598.12 ml    Labs/Imaging Results for orders placed or performed during the hospital encounter of 03/05/19 (from the past 48 hour(s))  Ethanol     Status: None   Collection Time: 03/05/19  1:02 PM  Result Value Ref Range   Alcohol, Ethyl (B) <10 <10 mg/dL    Comment: (NOTE) Lowest detectable limit for serum alcohol is 10 mg/dL. For medical purposes only. Performed at [facility], [address], [place], [place] [address]   Protime-INR     Status: None   Collection Time: 03/05/19  1:02 PM  Result Value Ref Range   Prothrombin Time 13.7 11.4 - 15.2 seconds   INR 1.1 0.8 - 1.2    Comment: (NOTE) INR goal varies based on device and disease states. Performed at [facility], [address], [place], [place] [address]   APTT     Status: Abnormal   Collection Time: 03/05/19  1:02 PM  Result  Value Ref Range   aPTT 21 (L) 24 - 36 seconds    Comment: Performed at [facility], [address], [place], [place] [address]  CBC     Status: None   Collection Time: 03/05/19  1:02 PM  Result Value Ref Range   WBC 7.8 4.0 - 10.5 K/uL   RBC 4.86 4.22 - 5.81 MIL/uL   Hemoglobin 14.4 13.0 - 17.0 g/dL   HCT 16.3 84.5 - 36.4 %   MCV 93.2 80.0 - 100.0 fL   MCH 29.6 26.0 - 34.0 pg   MCHC 31.8 30.0 - 36.0 g/dL   RDW 68.0 32.1 - 22.4 %   Platelets 239 150 - 400 K/uL   nRBC 0.0 0.0 - 0.2 %    Comment: Performed at [facility], [address] [place], [place] [address]   Differential     Status: None   Collection Time: 03/05/19  1:02 PM  Result Value Ref Range   Neutrophils Relative % 85 %   Neutro Abs 6.6 1.7 - 7.7 K/uL   Lymphocytes Relative 10 %   Lymphs Abs 0.8 0.7 - 4.0 K/uL   Monocytes Relative 5 %   Monocytes Absolute 0.4 0.1 - 1.0 K/uL   Eosinophils Relative 0 %   Eosinophils Absolute 0.0 0.0 - 0.5 K/uL   Basophils Relative 0 %   Basophils Absolute 0.0 0.0 - 0.1 K/uL   Immature Granulocytes 0 %   Abs Immature Granulocytes 0.02 0.00 - 0.07 K/uL    Comment: Performed at [facility], [address], [place], [place] [address]  Comprehensive metabolic panel     Status: Abnormal   Collection Time: 03/05/19  1:02 PM  Result Value Ref Range   Sodium 145 135 - 145 mmol/L   Potassium 4.0 3.5 - 5.1 mmol/L   Chloride 104 98 - 111 mmol/L   CO2 27 22 - 32 mmol/L   Glucose, Bld 209 (H) 70 - 99 mg/dL   BUN 17 8 - 23 mg/dL   Creatinine, Ser 8.89 0.61 - 1.24 mg/dL   Calcium 16.9 8.9 - 45.0 mg/dL   Total Protein 7.2 6.5 - 8.1 g/dL   Albumin 4.0 3.5 - 5.0 g/dL   AST 19 15 - 41 U/L   ALT 39 0 - 44 U/L   Alkaline Phosphatase 103 38 - 126 U/L   Total Bilirubin 0.6 0.3 - 1.2 mg/dL   GFR calc non Af Amer >60 >60 mL/min   GFR calc Af Amer >60 >60 mL/min   Anion gap 14 5 - 15    Comment: Performed at [facility], [address] [place], [place] [address]  Troponin I (High Sensitivity)     Status: None   Collection Time: 03/05/19  1:02 PM  Result Value Ref Range   Troponin I (High Sensitivity) 5 <18 ng/L    Comment: (NOTE) Elevated high sensitivity troponin I (hsTnI) values and significant  changes across serial measurements may suggest ACS but many other  chronic and acute conditions are known to elevate hsTnI results.  Refer to the "Links" section for chest pain algorithms and additional  guidance. Performed at [facility], [address], [place], [place] [address]   I-stat chem 8, ED     Status: Abnormal   Collection Time:  03/05/19  2:16 PM  Result Value Ref Range   Sodium 144 135 - 145 mmol/L   Potassium 3.9 3.5 - 5.1  mmol/L   Chloride 104 98 - 111 mmol/L   BUN 20 8 - 23 mg/dL   Creatinine, Ser 1.00 0.61 - 1.24 mg/dL   Glucose, Bld 202 (H) 70 - 99 mg/dL   Calcium, Ion 1.32 1.15 - 1.40 mmol/L   TCO2 28 22 - 32 mmol/L   Hemoglobin 15.6 13.0 - 17.0 g/dL   HCT 46.0 39.0 - 52.0 %  Urine rapid drug screen (hosp performed)     Status: None   Collection Time: 03/05/19  2:36 PM  Result Value Ref Range   Opiates NONE DETECTED NONE DETECTED   Cocaine NONE DETECTED NONE DETECTED   Benzodiazepines NONE DETECTED NONE DETECTED   Amphetamines NONE DETECTED NONE DETECTED   Tetrahydrocannabinol NONE DETECTED NONE DETECTED   Barbiturates NONE DETECTED NONE DETECTED    Comment: (NOTE) DRUG SCREEN FOR MEDICAL PURPOSES ONLY.  IF CONFIRMATION IS NEEDED FOR ANY PURPOSE, NOTIFY LAB WITHIN 5 DAYS. LOWEST DETECTABLE LIMITS FOR URINE DRUG SCREEN Drug Class                     Cutoff (ng/mL) Amphetamine and metabolites    1000 Barbiturate and metabolites    200 Benzodiazepine                 161 Tricyclics and metabolites     300 Opiates and metabolites        300 Cocaine and metabolites        300 THC                            50 Performed at [facility], [place] [address] [place], [place] [address]   Urinalysis, Routine w reflex microscopic     Status: Abnormal   Collection Time: 03/05/19  2:36 PM  Result Value Ref Range   Color, Urine YELLOW YELLOW   APPearance CLOUDY (A) CLEAR   Specific Gravity, Urine 1.020 1.005 - 1.030   pH 7.0 5.0 - 8.0   Glucose, UA NEGATIVE NEGATIVE mg/dL   Hgb urine dipstick NEGATIVE NEGATIVE   Bilirubin Urine NEGATIVE NEGATIVE   Ketones, ur NEGATIVE NEGATIVE mg/dL   Protein, ur 30 (A) NEGATIVE mg/dL   Nitrite NEGATIVE NEGATIVE   Leukocytes,Ua LARGE (A) NEGATIVE   RBC / HPF 0-5 0 - 5 RBC/hpf   WBC, UA 11-20 0 - 5 WBC/hpf   Bacteria, UA MANY (A) NONE SEEN   Squamous  Epithelial / LPF 0-5 0 - 5   Mucus PRESENT    Triple Phosphate Crystal PRESENT     Comment: Performed at [facility], [place] [address] [place], [place] [address]  Troponin I (High Sensitivity)     Status: None   Collection Time: 03/05/19  3:03 PM  Result Value Ref Range   Troponin I (High Sensitivity) 5 <18 ng/L    Comment: (NOTE) Elevated high sensitivity troponin I (hsTnI) values and significant  changes across serial measurements may suggest ACS but many other  chronic and acute conditions are known to elevate hsTnI results.  Refer to the "Links" section for chest pain algorithms and additional  guidance. Performed at [facility], [place] [address], [place], [place] [address]   SARS CORONAVIRUS 2 (TAT 6-24 HRS) Nasopharyngeal Nasopharyngeal Swab     Status: None   Collection Time: 03/05/19  3:24 PM   Specimen: Nasopharyngeal Swab  Result Value Ref Range   SARS Coronavirus 2 NEGATIVE  NEGATIVE    Comment: (NOTE) SARS-CoV-2 target nucleic acids are NOT DETECTED. The SARS-CoV-2 RNA is generally detectable in upper and lower respiratory specimens during the acute phase of infection. Negative results do not preclude SARS-CoV-2 infection, do not rule out co-infections with other pathogens, and should not be used as the sole basis for treatment or other patient management decisions. Negative results must be combined with clinical observations, patient history, and epidemiological information. The expected result is Negative. Fact Sheet for Patients: HairSlick.nohttps://www.fda.gov/media/138098/download Fact Sheet for Healthcare Providers: quierodirigir.comhttps://www.fda.gov/media/138095/download This test is not yet approved or cleared by the [place] FDA and  has been authorized for detection and/or diagnosis of SARS-CoV-2 by FDA under an Emergency Use Authorization (EUA). This EUA will remain  in effect (meaning this test can be used) for the duration of the COVID-19 declaration under Section  56 4(b)(1) of the Act, 21 U.S.C. section 360bbb-3(b)(1), unless the authorization is terminated or revoked sooner. Performed at [facility], [address] [place], [place] [address]   CBG monitoring, ED     Status: Abnormal   Collection Time: 03/05/19  5:03 PM  Result Value Ref Range   Glucose-Capillary 153 (H) 70 - 99 mg/dL   Comment 1 Notify RN    Comment 2 Document in Chart   CBG monitoring, ED     Status: Abnormal   Collection Time: 03/05/19  5:19 PM  Result Value Ref Range   Glucose-Capillary 159 (H) 70 - 99 mg/dL  CBC     Status: None   Collection Time: 03/05/19  5:20 PM  Result Value Ref Range   WBC 7.4 4.0 - 10.5 K/uL   RBC 4.66 4.22 - 5.81 MIL/uL   Hemoglobin 13.8 13.0 - 17.0 g/dL   HCT 60.443.3 54.039.0 - 98.152.0 %   MCV 92.9 80.0 - 100.0 fL   MCH 29.6 26.0 - 34.0 pg   MCHC 31.9 30.0 - 36.0 g/dL   RDW 19.113.2 47.811.5 - 29.515.5 %   Platelets 231 150 - 400 K/uL   nRBC 0.0 0.0 - 0.2 %    Comment: Performed at [facility], [address] [place], [place] [address]  Creatinine, serum     Status: None   Collection Time: 03/05/19  5:20 PM  Result Value Ref Range   Creatinine, Ser 0.94 0.61 - 1.24 mg/dL   GFR calc non Af Amer >60 >60 mL/min   GFR calc Af Amer >60 >60 mL/min    Comment: Performed at [facility], [address] [place], [place] [address]  Hemoglobin A1c     Status: Abnormal   Collection Time: 03/05/19  5:20 PM  Result Value Ref Range   Hgb A1c MFr Bld 7.1 (H) 4.8 - 5.6 %    Comment: (NOTE) Pre diabetes:          5.7%-6.4% Diabetes:              >6.4% Glycemic control for   <7.0% adults with diabetes    Mean Plasma Glucose 157.07 mg/dL    Comment: Performed at [facility], [address] [place], [place] [address]  CBG monitoring, ED     Status: Abnormal   Collection Time: 03/05/19  9:29 PM  Result Value Ref Range   Glucose-Capillary 131 (H) 70 - 99 mg/dL   Ct Head Wo Contrast  Result Date: 03/05/2019 CLINICAL DATA:  Altered mental  status EXAM: CT HEAD WITHOUT CONTRAST TECHNIQUE: Contiguous axial images were  obtained from the base of the skull through the vertex without intravenous contrast. COMPARISON:  2019 FINDINGS: Brain: There is no acute hemorrhage or mass effect. There are large chronic infarcts involving the left PCA and MCA territories. There is new hypoattenuation involving the left thalamus. There is also new hypoattenuation within the medial left parietal lobe. New small focus of hypoattenuation within the right cerebellum likely reflects age-indeterminate infarction. Additional hypoattenuation in the supratentorial matter is nonspecific but likely reflects stable chronic microvascular ischemic changes. There is increased ex vacuo dilatation of the lateral and third ventricles. Vascular: No hyperdense vessel. Atherosclerotic calcification at the skull base. Skull: Calvarium is unremarkable. Sinuses/Orbits: Patchy posterior left ethmoid mucosal thickening. Orbits are unremarkable. Other: Mastoid air cells are clear. IMPRESSION: New extension of left PCA territory infarction to involve thalamus and more of the parietal lobe. This is likely subacute or chronic although a component of acute ischemia is difficult to exclude. Large chronic left MCA territory infarction. New age-indeterminate small right cerebellar infarct. No acute intracranial hemorrhage. Chronic microvascular ischemic changes. Electronically Signed   By: Guadlupe Spanish M.D.   On: 03/05/2019 14:47   Mr Brain Wo Contrast  Result Date: 03/05/2019 CLINICAL DATA:  Altered level of consciousness, unexplained. Unresponsive episode. EXAM: MRI HEAD WITHOUT CONTRAST TECHNIQUE: Multiplanar, multiecho pulse sequences of the brain and surrounding structures were obtained without intravenous contrast. COMPARISON:  Head CT 03/05/2019 and MRI 03/31/2018 FINDINGS: Brain: Large chronic infarcts are again seen involving the left MCA and left PCA territories with associated chronic  hemosiderin deposition, ex vacuo dilatation of the left lateral ventricle, and wallerian degeneration extending into the brainstem. A 1 cm acute infarct is noted in the left corona radiata. Numerous chronic microhemorrhages are again seen in the deep gray nuclei, brainstem, and cerebellum compatible with chronic hypertension. There is a background of severe chronic small vessel ischemic disease in the cerebral white matter including chronic lacunar infarcts in the right corona radiata and deep gray nuclei as well as pons and cerebellum. A chronic lacunar infarct in the right cerebellar hemisphere is new from the prior MRI. There is mild leftward midline shift related to left cerebral hemispheric volume loss. No mass or extra-axial fluid collection is identified. Vascular: Abnormal appearance of the left MCA associated with the chronic infarct. Other major intracranial vascular flow voids are preserved. Skull and upper cervical spine: Unremarkable bone marrow signal. Sinuses/Orbits: Unremarkable orbits. Paranasal sinuses and mastoid air cells are clear. Other: None. IMPRESSION: 1. 1 cm acute left corona radiata infarct. 2. 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 3. Severe chronic small vessel ischemic disease with numerous chronic lacunar infarcts as above. Electronically Signed   By: Sebastian Ache M.D.   On: 03/05/2019 18:38   Dg Chest Portable 1 View  Result Date: 03/05/2019 CLINICAL DATA:  68 year old male with vomiting and cough. Query aspiration. EXAM: PORTABLE CHEST 1 VIEW COMPARISON:  Portable chest 04/03/2018. FINDINGS: Portable AP semi upright view at 1315 hours. Stable somewhat low lung volumes. Mediastinal contours remain normal. Visualized tracheal air column is within normal limits. Stable lungs, mild apical scarring but otherwise allowing for portable technique the lungs are clear. Negative visible bowel gas pattern. No acute osseous abnormality identified. Chronic glenohumeral degeneration.  IMPRESSION: No acute cardiopulmonary abnormality. Electronically Signed   By: Odessa Fleming M.D.   On: 03/05/2019 13:34    Pending Labs Unresulted Labs (From admission, onward)    Start     Ordered   03/12/19 0500  Creatinine, serum  (enoxaparin (LOVENOX)    CrCl >/= 30 ml/min)  Weekly,   R    Comments: while on enoxaparin therapy    03/05/19 1603   03/06/19 0500  Hemoglobin A1c  Tomorrow morning,   R     03/05/19 1603   03/06/19 0500  Lipid panel  Tomorrow morning,   R    Comments: Fasting    03/05/19 1603   03/05/19 1259  Blood culture (routine x 2)  BLOOD CULTURE X 2,   STAT     03/05/19 1302   03/05/19 1259  Urine culture  ONCE - STAT,   STAT     03/05/19 1302   Signed and Held  SARS CORONAVIRUS 2 (TAT 6-24 HRS) Nasopharyngeal Nasopharyngeal Swab  (COVID Labs)  Once,   R    Question Answer Comment  Is this test for diagnosis or screening Screening   Symptomatic for COVID-19 as defined by CDC No   Hospitalized for COVID-19 No   Admitted to ICU for COVID-19 No   Previously tested for COVID-19 No   Resident in a congregate (group) care setting No   Employed in healthcare setting No   Pre-procedural testing Yes      Signed and Held          Vitals/Pain Today's Vitals   03/05/19 2037 03/05/19 2100 03/05/19 2130 03/05/19 2200  BP: (!) 175/87 (!) 161/84 (!) 177/84 (!) 172/91  Pulse: (!) 107 99 (!) 101 97  Resp: Temp:      TempSrc:      SpO2: 98% 98% 98% 98%  Weight:      Height:      PainSc:        Isolation Precautions No active isolations  Medications Medications   stroke: mapping our early stages of recovery book (has no administration in time range)  acetaminophen (TYLENOL) tablet 650 mg (has no administration in time range)    Or  acetaminophen (TYLENOL) 160 MG/5ML solution 650 mg (has no administration in time range)    Or  acetaminophen (TYLENOL) suppository 650 mg (has no administration in time range)  enoxaparin (LOVENOX) injection 40 mg (40  mg Subcutaneous Given 03/05/19 2044)  insulin aspart (novoLOG) injection 0-15 Units (3 Units Subcutaneous Given 03/05/19 1736)  insulin aspart (novoLOG) injection 0-5 Units (0 Units Subcutaneous Not Given 03/05/19 2143)  cefTRIAXone (ROCEPHIN) 1 g in sodium chloride 0.9 % 100 mL IVPB (has no administration in time range)  atorvastatin (LIPITOR) tablet 80 mg (0 mg Oral Hold 03/05/19 1837)  pantoprazole (PROTONIX) EC tablet 40 mg (0 mg Oral Hold 03/05/19 1837)  tamsulosin (FLOMAX) capsule 0.4 mg (0 mg Oral Hold 03/05/19 1837)  clopidogrel (PLAVIX) tablet 75 mg (has no administration in time range)  baclofen (LIORESAL) tablet 20 mg (0 mg Oral Hold 03/05/19 2120)  lamoTRIgine (LAMICTAL) tablet 100 mg (0 mg Oral Hold 03/05/19 2120)  cefTRIAXone (ROCEPHIN) 1 g in sodium chloride 0.9 % 100 mL IVPB (0 g Intravenous Stopped 03/05/19 1636)  sodium chloride 0.9 % bolus 500 mL (0 mLs Intravenous Stopped 03/05/19 1649)  aspirin suppository 300 mg (300 mg Rectal Given 03/05/19 1848)    Mobility non-ambulatory High fall risk   Focused Assessments Neuro Assessment Handoff:  Swallow screen pass? No  Cardiac Rhythm: Normal sinus rhythm NIH Stroke Scale ( + Modified Stroke Scale Criteria)  Level of Consciousness (1a.)   : Alert, keenly responsive LOC Questions (1b. )   +: Answers  neither question correctly LOC Commands (1c. )   + : Performs both tasks correctly Best Gaze (2. )  +: Normal Visual (3. )  +: No visual loss Facial Palsy (4. )    : Partial paralysis  Motor Arm, Left (5a. )   +: No drift Motor Arm, Right (5b. )   +: No movement Motor Leg, Left (6a. )   +: No drift Motor Leg, Right (6b. )   +: No movement Limb Ataxia (7. ): Present in two limbs Sensory (8. )   +: Normal, no sensory loss Best Language (9. )   +: Severe aphasia Dysarthria (10. ): Intubated or other physical barrier Extinction/Inattention (11.)   +: Profound hemi-inattention or extinction to more than one modality Modified SS Total   +: 14 Complete NIHSS TOTAL: 18     Neuro Assessment: Exceptions to WDL Neuro Checks:      Last Documented NIHSS Modified Score: 14 (03/05/19 1733) Has TPA been given? No If patient is a Neuro Trauma and patient is going to OR before floor call report to 4N Charge nurse: [phone] or [phone]     R Recommendations: See Admitting Provider Note  Report given to:   Additional Notes: Pt has right arm contracture and limited movement w/right leg. Also expressive aphasia d/t previous CVA.

## 2019-03-05 NOTE — ED Notes (Signed)
Patient transported to MRI 

## 2019-03-05 NOTE — ED Notes (Signed)
Echo at bedside

## 2019-03-05 NOTE — ED Provider Notes (Signed)
Care assumed from [facility], [place] at shift change. See his note for full H&P.   Per his note, "Douglas [name] is a 68 y.o. male history of CVA with residual right-sided deficits/contracture/aphasia, hypertension, diabetes presents today with his daughter following a questionable syncopal episode.  Daughter who is at bedside provides history as patient is aphasic.  She reports that approximately 11 AM this morning the patient was eating breakfast in another room when she heard the patient began to cough and vomit.  When she entered the room she reports that patient was not acting himself he was staring off blankly into space, she and the home health nurse then brought the patient in his wheelchair to his bed, she reports that the episode lasted approximately 1 minute until patient returned to baseline.  She reports that patient was sitting in his wheelchair when the episode happened and had no fall or injury.  Daughter is concerned because patient had a similar unresponsive episode during his CVA in November 2019.  Of note daughter reports that patient was incontinent of urine once this morning which is unusual for him, this happened a few hours before the unresponsive episode.  No history of fever, facial droop, fall/injury, chest pain, abdominal pain, diarrhea, dysuria/hematuria or any additional concerns.  Daughter reports that patient has been compliant with daily Plavix and aspirin therapy and was in normal state of health yesterday.  Of note patient was seen in the ED on 02/21/2019, diagnosed with cystitis and was treated with Keflex."  Physical Exam  BP (!) 142/86   Pulse 97   Temp 97.9 F (36.6 C) (Rectal)   Resp 17   Ht 6\' 1"  (1.854 m)   Wt 82.6 kg   SpO2 100%   BMI 24.03 kg/m    ED Course/Procedures     Procedures  Results for orders placed or performed during the hospital encounter of 03/05/19  Ethanol  Result Value Ref Range   Alcohol, Ethyl (B) <10 <10 mg/dL   Protime-INR  Result Value Ref Range   Prothrombin Time 13.7 11.4 - 15.2 seconds   INR 1.1 0.8 - 1.2  APTT  Result Value Ref Range   aPTT 21 (L) 24 - 36 seconds  CBC  Result Value Ref Range   WBC 7.8 4.0 - 10.5 K/uL   RBC 4.86 4.22 - 5.81 MIL/uL   Hemoglobin 14.4 13.0 - 17.0 g/dL   HCT 13/03/20 24.0 - 97.3 %   MCV 93.2 80.0 - 100.0 fL   MCH 29.6 26.0 - 34.0 pg   MCHC 31.8 30.0 - 36.0 g/dL   RDW 53.2 99.2 - 42.6 %   Platelets 239 150 - 400 K/uL   nRBC 0.0 0.0 - 0.2 %  Differential  Result Value Ref Range   Neutrophils Relative % 85 %   Neutro Abs 6.6 1.7 - 7.7 K/uL   Lymphocytes Relative 10 %   Lymphs Abs 0.8 0.7 - 4.0 K/uL   Monocytes Relative 5 %   Monocytes Absolute 0.4 0.1 - 1.0 K/uL   Eosinophils Relative 0 %   Eosinophils Absolute 0.0 0.0 - 0.5 K/uL   Basophils Relative 0 %   Basophils Absolute 0.0 0.0 - 0.1 K/uL   Immature Granulocytes 0 %   Abs Immature Granulocytes 0.02 0.00 - 0.07 K/uL  Comprehensive metabolic panel  Result Value Ref Range   Sodium 145 135 - 145 mmol/L   Potassium 4.0 3.5 - 5.1 mmol/L   Chloride 104 98 -  111 mmol/L   CO2 27 22 - 32 mmol/L   Glucose, Bld 209 (H) 70 - 99 mg/dL   BUN 17 8 - 23 mg/dL   Creatinine, Ser 8.29 0.61 - 1.24 mg/dL   Calcium 56.2 8.9 - 13.0 mg/dL   Total Protein 7.2 6.5 - 8.1 g/dL   Albumin 4.0 3.5 - 5.0 g/dL   AST 19 15 - 41 U/L   ALT 39 0 - 44 U/L   Alkaline Phosphatase 103 38 - 126 U/L   Total Bilirubin 0.6 0.3 - 1.2 mg/dL   GFR calc non Af Amer >60 >60 mL/min   GFR calc Af Amer >60 >60 mL/min   Anion gap 14 5 - 15  Urine rapid drug screen (hosp performed)  Result Value Ref Range   Opiates NONE DETECTED NONE DETECTED   Cocaine NONE DETECTED NONE DETECTED   Benzodiazepines NONE DETECTED NONE DETECTED   Amphetamines NONE DETECTED NONE DETECTED   Tetrahydrocannabinol NONE DETECTED NONE DETECTED   Barbiturates NONE DETECTED NONE DETECTED  Urinalysis, Routine w reflex microscopic  Result Value Ref Range   Color,  Urine YELLOW YELLOW   APPearance CLOUDY (A) CLEAR   Specific Gravity, Urine 1.020 1.005 - 1.030   pH 7.0 5.0 - 8.0   Glucose, UA NEGATIVE NEGATIVE mg/dL   Hgb urine dipstick NEGATIVE NEGATIVE   Bilirubin Urine NEGATIVE NEGATIVE   Ketones, ur NEGATIVE NEGATIVE mg/dL   Protein, ur 30 (A) NEGATIVE mg/dL   Nitrite NEGATIVE NEGATIVE   Leukocytes,Ua LARGE (A) NEGATIVE   RBC / HPF 0-5 0 - 5 RBC/hpf   WBC, UA 11-20 0 - 5 WBC/hpf   Bacteria, UA MANY (A) NONE SEEN   Squamous Epithelial / LPF 0-5 0 - 5   Mucus PRESENT    Triple Phosphate Crystal PRESENT   I-stat chem 8, ED  Result Value Ref Range   Sodium 144 135 - 145 mmol/L   Potassium 3.9 3.5 - 5.1 mmol/L   Chloride 104 98 - 111 mmol/L   BUN 20 8 - 23 mg/dL   Creatinine, Ser 8.65 0.61 - 1.24 mg/dL   Glucose, Bld 784 (H) 70 - 99 mg/dL   Calcium, Ion 6.96 2.95 - 1.40 mmol/L   TCO2 28 22 - 32 mmol/L   Hemoglobin 15.6 13.0 - 17.0 g/dL   HCT 28.4 13.2 - 44.0 %  Troponin I (High Sensitivity)  Result Value Ref Range   Troponin I (High Sensitivity) 5 <18 ng/L   Ct Head Wo Contrast  Result Date: 03/05/2019 CLINICAL DATA:  Altered mental status EXAM: CT HEAD WITHOUT CONTRAST TECHNIQUE: Contiguous axial images were obtained from the base of the skull through the vertex without intravenous contrast. COMPARISON:  2019 FINDINGS: Brain: There is no acute hemorrhage or mass effect. There are large chronic infarcts involving the left PCA and MCA territories. There is new hypoattenuation involving the left thalamus. There is also new hypoattenuation within the medial left parietal lobe. New small focus of hypoattenuation within the right cerebellum likely reflects age-indeterminate infarction. Additional hypoattenuation in the supratentorial matter is nonspecific but likely reflects stable chronic microvascular ischemic changes. There is increased ex vacuo dilatation of the lateral and third ventricles. Vascular: No hyperdense vessel. Atherosclerotic  calcification at the skull base. Skull: Calvarium is unremarkable. Sinuses/Orbits: Patchy posterior left ethmoid mucosal thickening. Orbits are unremarkable. Other: Mastoid air cells are clear. IMPRESSION: New extension of left PCA territory infarction to involve thalamus and more of the parietal lobe. This is likely  subacute or chronic although a component of acute ischemia is difficult to exclude. Large chronic left MCA territory infarction. New age-indeterminate small right cerebellar infarct. No acute intracranial hemorrhage. Chronic microvascular ischemic changes. Electronically Signed   By: Macy Mis M.D.   On: 03/05/2019 14:47   Dg Chest Portable 1 View  Result Date: 03/05/2019 CLINICAL DATA:  68 year old male with vomiting and cough. Query aspiration. EXAM: PORTABLE CHEST 1 VIEW COMPARISON:  Portable chest 04/03/2018. FINDINGS: Portable AP semi upright view at 1315 hours. Stable somewhat low lung volumes. Mediastinal contours remain normal. Visualized tracheal air column is within normal limits. Stable lungs, mild apical scarring but otherwise allowing for portable technique the lungs are clear. Negative visible bowel gas pattern. No acute osseous abnormality identified. Chronic glenohumeral degeneration. IMPRESSION: No acute cardiopulmonary abnormality. Electronically Signed   By: Genevie Ann M.D.   On: 03/05/2019 13:34   Vas [place] Lower Extremity Venous (dvt) (only [facility])  Result Date: 02/21/2019  Lower Venous Study Indications: Edema.  Performing Technologist: [name] RVS  Examination Guidelines: A complete evaluation includes B-mode imaging, spectral Doppler, color Doppler, and power Doppler as needed of all accessible portions of each vessel. Bilateral testing is considered an integral part of a complete examination. Limited examinations for reoccurring indications may be performed as noted.  +---------+---------------+---------+-----------+----------+--------------+ RIGHT     CompressibilityPhasicitySpontaneityPropertiesThrombus Aging +---------+---------------+---------+-----------+----------+--------------+ CFV      Full           Yes      Yes                                 +---------+---------------+---------+-----------+----------+--------------+ SFJ      Full                                                        +---------+---------------+---------+-----------+----------+--------------+ FV Prox  Full                                                        +---------+---------------+---------+-----------+----------+--------------+ FV Mid   Full                                                        +---------+---------------+---------+-----------+----------+--------------+ FV DistalFull                                                        +---------+---------------+---------+-----------+----------+--------------+ PFV      Full                                                        +---------+---------------+---------+-----------+----------+--------------+ POP  Full           Yes      Yes                                 +---------+---------------+---------+-----------+----------+--------------+ PTV      Full                                                        +---------+---------------+---------+-----------+----------+--------------+ PERO     Full                                                        +---------+---------------+---------+-----------+----------+--------------+     Summary: Right: There is no evidence of deep vein thrombosis in the lower extremity. No cystic structure found in the popliteal fossa.  *See table(s) above for measurements and observations. Electronically signed by Lemar LivingsBrandon Cain MD on 02/21/2019 at 2:16:28 PM.    Final      MDM    68 y/o male with a h/o CVA (chronic right sided deficits and aphasia), seizures, who presents to the ED for eval of AMS which occurred this AM.    Neuro exam at baseline for patient.  Reviewed labs,  CBC, CMP, trop, etoh, coags reassuring.  UA is suggestive of UTI  CT head with: 1. New extension of left PCA territory infarction to involve thalamus and more of the parietal lobe. This is likely subacute or chronic although a component of acute ischemia is difficult to exclude. 2. Large chronic left MCA territory infarction. 3. New age-indeterminate small right cerebellar infarct. 4. No acute intracranial hemorrhage. Chronic microvascular ischemic changes.  Prior provider discussed case with neurology who does not recommend EEG at this time but does recommend MRI brain.   Plan for admission for tx for persistent UTI after appropriate outpatient therapy and concern for new CVA vs recurrent seizures.   3:47 PM CONSULT with Dr. Jacqulyn BathPahwani who accepts patient for admission.     Karrie MeresCouture, Lahoma Constantin S, PA-C 03/05/19 1549    Arby BarrettePfeiffer, Marcy, MD 03/05/19 [phone]

## 2019-03-05 NOTE — ED Notes (Signed)
Attempted to call report x 1  

## 2019-03-05 NOTE — ED Notes (Signed)
Paged B Kyere d/t pt's BP is increasing and no BP meds ordered. Pt is NPO. Daughter at bedside.

## 2019-03-05 NOTE — ED Notes (Signed)
3W Secretary advised room is clean.

## 2019-03-05 NOTE — ED Notes (Signed)
Baldo Ash, [place] - to call back to advise if room is clean and ready.

## 2019-03-05 NOTE — ED Provider Notes (Signed)
[facility] EMERGENCY DEPARTMENT Provider Note   CSN: [id number] Arrival date & time: 03/05/19  1229     History   Chief Complaint Chief Complaint  Patient presents with   Near Syncope    HPI Kalil Woessner is a 68 y.o. male history of CVA with residual right-sided deficits/contracture/aphasia, hypertension, diabetes presents today with his daughter following a questionable syncopal episode.  Daughter who is at bedside provides history as patient is aphasic.  She reports that approximately 11 AM this morning the patient was eating breakfast in another room when she heard the patient began to cough and vomit.  When she entered the room she reports that patient was not acting himself he was staring off blankly into space, she and the home health nurse then brought the patient in his wheelchair to his bed, she reports that the episode lasted approximately 1 minute until patient returned to baseline.  She reports that patient was sitting in his wheelchair when the episode happened and had no fall or injury.  Daughter is concerned because patient had a similar unresponsive episode during his CVA in November 2019.  Of note daughter reports that patient was incontinent of urine once this morning which is unusual for him, this happened a few hours before the unresponsive episode.  No history of fever, facial droop, fall/injury, chest pain, abdominal pain, diarrhea, dysuria/hematuria or any additional concerns.  Daughter reports that patient has been compliant with daily Plavix and aspirin therapy and was in normal state of health yesterday.  Of note patient was seen in the ED on 02/21/2019, diagnosed with cystitis and was treated with Keflex.    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   Acute lower UTI    Type 2 diabetes mellitus with peripheral neuropathy (HCC)    Acute embolic stroke (HCC)    Diabetes 1.5, managed as type 2 (HCC)    Essential hypertension    Dyslipidemia    History of CVA with residual deficit    Dysphagia, post-stroke     Past Surgical History:  Procedure Laterality Date   IR PATIENT EVAL TECH 0-60 MINS  03/30/2018   TEE WITHOUT CARDIOVERSION N/A 04/02/2018   Procedure: TRANSESOPHAGEAL ECHOCARDIOGRAM (TEE);  Surgeon: [name], MD;  Location: [facility] ENDOSCOPY;  Service: Cardiovascular;  Laterality: N/A;        Home Medications    Prior to Admission medications   Medication Sig Start Date End Date Taking? Authorizing Provider  ACCU-CHEK AVIVA PLUS test strip daily. 12/28/18   [provider]  amLODipine (NORVASC) 10 MG tablet Take 1 tablet (10 mg total) by mouth daily. 05/04/18   Angiulli, Mcarthur Rossetti, PA-C  aspirin EC 325 MG EC tablet Take 1 tablet (325 mg total) by mouth daily. 04/04/18   Layne Benton, NP  atorvastatin (LIPITOR) 20 MG tablet Take 1 tablet (20 mg total) by mouth at bedtime. 05/04/18   Angiulli, Mcarthur Rossetti, PA-C  baclofen (  LIORESAL) 10 MG tablet Take 10 mg by mouth 4 (four) times daily. 12/13/18   [provider]  cephALEXin (KEFLEX) 500 MG capsule Take 1 capsule (500 mg total) by mouth 4 (four) times daily. 02/20/19   Varney Biles, MD  cloNIDine (CATAPRES) 0.2 MG tablet Take 1 tablet (0.2 mg total) by mouth 2 (two) times daily. Patient not taking: Reported on 01/20/2019 05/04/18   Angiulli, Lavon Paganini, PA-C  clopidogrel (PLAVIX) 75 MG tablet Take 1 tablet (75 mg total) by mouth daily. 05/04/18   Angiulli, Lavon Paganini, PA-C  glipiZIDE  (GLUCOTROL) 10 MG tablet Take 1 tablet (10 mg total) by mouth 2 (two) times daily. 05/04/18   Angiulli, Lavon Paganini, PA-C  hydrALAZINE (APRESOLINE) 25 MG tablet Take 25 mg by mouth every 8 (eight) hours. Take with hydralazine 50 mg (75mg  total) 01/05/19   [provider]  hydrALAZINE (APRESOLINE) 50 MG tablet Take 1 tablet (50 mg total) by mouth 3 (three) times daily. Patient taking differently: Take 50 mg by mouth every 8 (eight) hours. Take with hydralazine 25mg  (75mg  total) 05/04/18   Angiulli, Lavon Paganini, PA-C  insulin detemir (LEVEMIR) 100 UNIT/ML injection Inject 0.25 mLs (25 Units total) into the skin 2 (two) times daily. Patient taking differently: Inject 16 Units into the skin as needed (if blood pressure is above 125).  05/04/18   Angiulli, Lavon Paganini, PA-C  labetalol (NORMODYNE) 200 MG tablet Take 200 mg by mouth as needed (give as an emergency if blood pressure is too high).     [provider]  lamoTRIgine (LAMICTAL) 100 MG tablet Take 100 mg by mouth 2 (two) times daily. 11/18/18   [provider]  lisinopril (ZESTRIL) 20 MG tablet Take 20 mg by mouth 2 (two) times daily. 11/18/18   [provider]  Maltodextrin-Xanthan Gum (RESOURCE THICKENUP CLEAR) POWD Take 120 g by mouth as needed (nectar thick liquid consistency). Patient not taking: Reported on 01/20/2019 04/04/18   Donzetta Starch, NP  methylphenidate (RITALIN) 10 MG tablet Take 1 tablet (10 mg total) by mouth 2 (two) times daily with breakfast and lunch. Patient not taking: Reported on 01/20/2019 05/04/18   Angiulli, Lavon Paganini, PA-C  metoprolol succinate (TOPROL-XL) 50 MG 24 hr tablet Take 1 tablet (50 mg total) by mouth daily. Patient taking differently: Take 25 mg by mouth at bedtime.  05/04/18   Angiulli, Lavon Paganini, PA-C  NOVOLOG FLEXPEN 100 UNIT/ML FlexPen Inject 3 Units into the skin See admin instructions.  If blood suger is above 200; per sliding scale. 10/01/18   [provider]  pantoprazole (PROTONIX)  40 MG tablet Take 1 tablet (40 mg total) by mouth daily. 05/04/18   Angiulli, Lavon Paganini, PA-C  tamsulosin (FLOMAX) 0.4 MG CAPS capsule Take 0.4 mg by mouth daily. 09/30/18   [provider]    Family History No family history on file.  Social History Social History   Tobacco Use   Smoking status: Never Smoker   Smokeless tobacco: Never Used  Substance Use Topics   Alcohol use: Not Currently   Drug use: Never     Allergies   Ciprofloxacin   Review of Systems Review of Systems  Unable to perform ROS: Patient nonverbal     Physical Exam Updated Vital Signs BP (!) 142/86    Pulse 97    Temp 97.9 F (36.6 C) (Rectal)    Resp 17    Ht 6\' 1"  (1.854 m)    Wt 82.6 kg  SpO2 100%    BMI 24.03 kg/m   Physical Exam Constitutional:      General: He is not in acute distress.    Appearance: Normal appearance. He is well-developed. He is not diaphoretic.     Comments: Chronically ill-appearing  HENT:     Head: Normocephalic and atraumatic.     Right Ear: External ear normal.     Left Ear: External ear normal.     Nose: Nose normal.  Eyes:     General: Vision grossly intact. Gaze aligned appropriately.     Pupils: Pupils are equal, round, and reactive to light.  Neck:     Musculoskeletal: Normal range of motion.     Trachea: Trachea and phonation normal. No tracheal deviation.  Cardiovascular:     Rate and Rhythm: Regular rhythm. Tachycardia present.  Pulmonary:     Effort: Pulmonary effort is normal. No respiratory distress.     Breath sounds: Normal breath sounds.  Abdominal:     General: There is no distension.     Palpations: Abdomen is soft.     Tenderness: There is no abdominal tenderness. There is no guarding or rebound.  Musculoskeletal: Normal range of motion.     Comments: No midline C/T/L spinal tenderness to palpation, no paraspinal muscle tenderness, no deformity, crepitus, or step-off noted. No sign of injury to the neck or back.  Patient able to  sit up in bed with assistance.  Hips stable to compression bilaterally. - Chronic contracture of the right upper extremity that daughter reports as baseline.  Residual right-sided weakness daughter reports is baseline.  Skin:    General: Skin is warm and dry.  Neurological:     Mental Status: He is alert.     GCS: GCS eye subscore is 4. GCS verbal subscore is 2. GCS motor subscore is 6.     Comments: Aphasia, inappropriate occasional laughter that daughter reports is baseline.  Intermittently follows commands.  Residual right-sided weakness daughter reports is baseline.  Residual right-sided facial droop patient's daughter reports as baseline. Daughter does report that "something seems off", reports he seems more confused and does not follow commands as normal.  Psychiatric:        Behavior: Behavior normal.    ED Treatments / Results  Labs (all labs ordered are listed, but only abnormal results are displayed) Labs Reviewed  APTT - Abnormal; Notable for the following components:      Result Value   aPTT 21 (*)    All other components within normal limits  COMPREHENSIVE METABOLIC PANEL - Abnormal; Notable for the following components:   Glucose, Bld 209 (*)    All other components within normal limits  URINALYSIS, ROUTINE W REFLEX MICROSCOPIC - Abnormal; Notable for the following components:   APPearance CLOUDY (*)    Protein, ur 30 (*)    Leukocytes,Ua LARGE (*)    Bacteria, UA MANY (*)    All other components within normal limits  I-STAT CHEM 8, ED - Abnormal; Notable for the following components:   Glucose, Bld 202 (*)    All other components within normal limits  CULTURE, BLOOD (ROUTINE X 2)  CULTURE, BLOOD (ROUTINE X 2)  URINE CULTURE  SARS CORONAVIRUS 2 (TAT 6-24 HRS)  ETHANOL  PROTIME-INR  CBC  DIFFERENTIAL  RAPID URINE DRUG SCREEN, HOSP PERFORMED  TROPONIN I (HIGH SENSITIVITY)  TROPONIN I (HIGH SENSITIVITY)    EKG EKG Interpretation  Date/Time:  Tuesday  March 05 2019 12:49:40 EST Ventricular  Rate:  99 PR Interval:    QRS Duration: 80 QT Interval:  342 QTC Calculation: 439 R Axis:   59 Text Interpretation: Sinus rhythm Borderline T abnormalities, diffuse leads When compared to prior, no significant changes seen. No STEMI Confirmed by Theda Belfast ([id number]) on 03/05/2019 12:59:44 PM   Radiology Ct Head Wo Contrast  Result Date: 03/05/2019 CLINICAL DATA:  Altered mental status EXAM: CT HEAD WITHOUT CONTRAST TECHNIQUE: Contiguous axial images were obtained from the base of the skull through the vertex without intravenous contrast. COMPARISON:  2019 FINDINGS: Brain: There is no acute hemorrhage or mass effect. There are large chronic infarcts involving the left PCA and MCA territories. There is new hypoattenuation involving the left thalamus. There is also new hypoattenuation within the medial left parietal lobe. New small focus of hypoattenuation within the right cerebellum likely reflects age-indeterminate infarction. Additional hypoattenuation in the supratentorial matter is nonspecific but likely reflects stable chronic microvascular ischemic changes. There is increased ex vacuo dilatation of the lateral and third ventricles. Vascular: No hyperdense vessel. Atherosclerotic calcification at the skull base. Skull: Calvarium is unremarkable. Sinuses/Orbits: Patchy posterior left ethmoid mucosal thickening. Orbits are unremarkable. Other: Mastoid air cells are clear. IMPRESSION: New extension of left PCA territory infarction to involve thalamus and more of the parietal lobe. This is likely subacute or chronic although a component of acute ischemia is difficult to exclude. Large chronic left MCA territory infarction. New age-indeterminate small right cerebellar infarct. No acute intracranial hemorrhage. Chronic microvascular ischemic changes. Electronically Signed   By: Guadlupe Spanish M.D.   On: 03/05/2019 14:47   Dg Chest Portable 1 View  Result Date:  03/05/2019 CLINICAL DATA:  68 year old male with vomiting and cough. Query aspiration. EXAM: PORTABLE CHEST 1 VIEW COMPARISON:  Portable chest 04/03/2018. FINDINGS: Portable AP semi upright view at 1315 hours. Stable somewhat low lung volumes. Mediastinal contours remain normal. Visualized tracheal air column is within normal limits. Stable lungs, mild apical scarring but otherwise allowing for portable technique the lungs are clear. Negative visible bowel gas pattern. No acute osseous abnormality identified. Chronic glenohumeral degeneration. IMPRESSION: No acute cardiopulmonary abnormality. Electronically Signed   By: Odessa Fleming M.D.   On: 03/05/2019 13:34    Procedures Procedures (including critical care time)  Medications Ordered in ED Medications  cefTRIAXone (ROCEPHIN) 1 g in sodium chloride 0.9 % 100 mL IVPB (has no administration in time range)  sodium chloride 0.9 % bolus 500 mL (has no administration in time range)     Initial Impression / Assessment and Plan / ED Course  I have reviewed the triage vital signs and the nursing notes.  Pertinent labs & imaging results that were available during my care of the patient were reviewed by me and considered in my medical decision making (see chart for details).    68 year old male with chronic right-sided facial droop, right-sided hemiplegia and aphasia.  Brief episode of staring spell after emesis while eating breakfast.  Family reports patient seems somewhat more confused than at baseline and not following commands well.  Mildly tachycardic on examination otherwise vitals reassuring.  Recently treated for urinary tract infection outpatient with Keflex. - UDS negative I-STAT Chem-8 unremarkable Ethanol negative CMP nonacute Troponin: 5 APTT 21 PT/INR within normal limits CBC within normal limits Urinalysis shows urinary tract infection, sent for culture Chest x-ray:  IMPRESSION:  No acute cardiopulmonary abnormality   CT Head:    IMPRESSION:  New extension of left PCA territory infarction to involve thalamus  and more of the parietal lobe. This is likely subacute or chronic  although a component of acute ischemia is difficult to exclude.    Large chronic left MCA territory infarction.    New age-indeterminate small right cerebellar infarct.    No acute intracranial hemorrhage. Chronic microvascular ischemic  changes.   EKG: Sinus rhythm Borderline T abnormalities, diffuse leads When compared to prior, no significant changes seen. No STEMI Confirmed by Theda Belfast ([id number]) on 03/05/2019 12:59:44 PM - Discussed CT scan findings with neurology Dr. Jerrell Belfast who recommends MRI brain.  If acute findings advises to call his service back. - MRI brain ordered, will seek admission for altered mental status, failed outpatient treatment of urinary tract infection.  Patient does not currently meet SIRS/sepsis criteria.  Consult placed to hospitalist.  Patient reassessed resting company watching TV, patient's daughter updated on plan of care and is agreeable for admission for further work-up this time. - Care handoff given to Maree Krabbe PA-C at shift change, plan of care is to await hospitalist phone call for admission.  Patient seen and evaluated by Dr. Rush Landmark this visit.  Case discussed with Dr.Note: Portions of this report may have been transcribed using voice recognition software. Every effort was made to ensure accuracy; however, inadvertent computerized transcription errors may still be present. Final Clinical Impressions(s) / ED Diagnoses   Final diagnoses:  Urinary tract infection with hematuria, site unspecified  Altered mental status, unspecified altered mental status type    ED Discharge Orders    None       Elizabeth Palau 03/05/19 1546    Tegeler, Canary Brim, MD 03/06/19 1313

## 2019-03-05 NOTE — Progress Notes (Signed)
  Echocardiogram 2D Echocardiogram has been performed.  Douglas [name] G Leelah Hanna 03/05/2019, 5:12 PM

## 2019-03-05 NOTE — H&P (Signed)
History and Physical    Douglas [name] ZOX:[id number] DOB: 1950/11/25 DOA: 03/05/2019  PCP: Patient, No Pcp Per  Patient coming from: home I have personally briefly reviewed patient's old medical records in [facility]  Chief Complaint: Altered mental status  HPI: Douglas [name] is a 68 y.o. 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  Daughter is the historian who reports that around 74 AM this morning the patient was eating breakfast when he an episode of vomiting.  When she entered the room she reports that patient was not acting like himself, confused, staring at her-episode lasted for approximately 1 minute and patient returned to his baseline.  Daughter called EMS as she was concerned about stroke as patient had a similar symptoms in the member 2019 when he was found to have a stroke.  Patient also had an episode of urinary incontinence this morning.  No seizure, fall, head trauma, fever, chills, nausea, diarrhea, epigastric pain, decreased appetite, cough, congestion, runny nose, sore throat, urinary symptoms, lightheadedness, dizziness, headache or blurry vision.  He is compliant with his home medication, no smoking, alcohol, street drug use.  Patient recently diagnosed with acute cystitis and was started on Keflex by his PCP which he finished a course as prescribed 3 days ago.  ED Course: Upon arrival: Patient's blood pressure slightly elevated.  Ethanol: WNL.  CBC, CMP, troponin: WNL.  UA positive for leukocytes and bacteria's.  UDS negative.  Blood culture, urine culture and COVID-19 all were pending.  CT head was obtained which came back positive for new extension of left PCA territory infarction to involve thalamus and more of the parietal lobe-likely subacute or chronic?.  EDP consulted neurology.  MRI brain ordered.  Review of  Systems: As per HPI otherwise negative.    Past Medical History:  Diagnosis Date  . Diabetes mellitus without complication (HCC)   . Hypertension   . Stroke [facility])     Past Surgical History:  Procedure Laterality Date  . IR PATIENT EVAL TECH 0-60 MINS  03/30/2018  . TEE WITHOUT CARDIOVERSION N/A 04/02/2018   Procedure: TRANSESOPHAGEAL ECHOCARDIOGRAM (TEE);  Surgeon: [name], MD;  Location: [facility] ENDOSCOPY;  Service: Cardiovascular;  Laterality: N/A;     reports that he has never smoked. He has never used smokeless tobacco. He reports previous alcohol use. He reports that he does not use drugs.  Allergies  Allergen Reactions  . Ciprofloxacin Anaphylaxis    No family history on file.  Prior to Admission medications   Medication Sig Start Date End Date Taking? Authorizing Provider  ACCU-CHEK AVIVA PLUS test strip daily. 12/28/18   [provider]  amLODipine (NORVASC) 10 MG tablet Take 1 tablet (10 mg total) by mouth daily. 05/04/18   Angiulli, Mcarthur Rossetti, PA-C  aspirin EC 325 MG EC tablet Take 1 tablet (325 mg total) by mouth daily. 04/04/18   Layne Benton, NP  atorvastatin (LIPITOR) 20 MG tablet Take 1 tablet (20 mg total) by mouth at bedtime. 05/04/18   Angiulli, Mcarthur Rossetti, PA-C  baclofen (LIORESAL) 10 MG tablet Take 10 mg by mouth 4 (four) times daily. 12/13/18   [provider]  cephALEXin (KEFLEX) 500 MG capsule Take 1 capsule (500 mg total) by mouth 4 (four) times daily. 02/20/19   Derwood Kaplan, MD  cloNIDine (CATAPRES) 0.2 MG tablet Take 1 tablet (0.2 mg total) by  mouth 2 (two) times daily. Patient not taking: Reported on 01/20/2019 05/04/18   Angiulli, Lavon Paganini, PA-C  clopidogrel (PLAVIX) 75 MG tablet Take 1 tablet (75 mg total) by mouth daily. 05/04/18   Angiulli, Lavon Paganini, PA-C  glipiZIDE (GLUCOTROL) 10 MG tablet Take 1 tablet (10 mg total) by mouth 2 (two) times daily. 05/04/18   Angiulli, Lavon Paganini, PA-C  hydrALAZINE (APRESOLINE) 25 MG tablet Take 25 mg by  mouth every 8 (eight) hours. Take with hydralazine 50 mg (75mg  total) 01/05/19   [provider]  hydrALAZINE (APRESOLINE) 50 MG tablet Take 1 tablet (50 mg total) by mouth 3 (three) times daily. Patient taking differently: Take 50 mg by mouth every 8 (eight) hours. Take with hydralazine 25mg  (75mg  total) 05/04/18   Angiulli, Lavon Paganini, PA-C  insulin detemir (LEVEMIR) 100 UNIT/ML injection Inject 0.25 mLs (25 Units total) into the skin 2 (two) times daily. Patient taking differently: Inject 16 Units into the skin as needed (if blood pressure is above 125).  05/04/18   Angiulli, Lavon Paganini, PA-C  labetalol (NORMODYNE) 200 MG tablet Take 200 mg by mouth as needed (give as an emergency if blood pressure is too high).     [provider]  lamoTRIgine (LAMICTAL) 100 MG tablet Take 100 mg by mouth 2 (two) times daily. 11/18/18   [provider]  lisinopril (ZESTRIL) 20 MG tablet Take 20 mg by mouth 2 (two) times daily. 11/18/18   [provider]  Maltodextrin-Xanthan Gum (RESOURCE THICKENUP CLEAR) POWD Take 120 g by mouth as needed (nectar thick liquid consistency). Patient not taking: Reported on 01/20/2019 04/04/18   Donzetta Starch, NP  methylphenidate (RITALIN) 10 MG tablet Take 1 tablet (10 mg total) by mouth 2 (two) times daily with breakfast and lunch. Patient not taking: Reported on 01/20/2019 05/04/18   Angiulli, Lavon Paganini, PA-C  metoprolol succinate (TOPROL-XL) 50 MG 24 hr tablet Take 1 tablet (50 mg total) by mouth daily. Patient taking differently: Take 25 mg by mouth at bedtime.  05/04/18   Angiulli, Lavon Paganini, PA-C  NOVOLOG FLEXPEN 100 UNIT/ML FlexPen Inject 3 Units into the skin See admin instructions.  If blood suger is above 200; per sliding scale. 10/01/18   [provider]  pantoprazole (PROTONIX) 40 MG tablet Take 1 tablet (40 mg total) by mouth daily. 05/04/18   Angiulli, Lavon Paganini, PA-C  tamsulosin (FLOMAX) 0.4 MG CAPS capsule Take 0.4 mg by mouth daily. 09/30/18    [provider]    Physical Exam: Vitals:   03/05/19 1315 03/05/19 1345 03/05/19 1445 03/05/19 1515  BP: (!) 142/84 140/80 (!) 151/84 (!) 142/86  Pulse: 96  96 97  Resp: 12 16 15 17   Temp:      TempSrc:      SpO2: 100%  99% 100%  Weight:      Height:        Constitutional: NAD, calm, comfortable, alert, following commands, answers yes to almost all questions. Eyes: PERRL, lids and conjunctivae normal ENMT: Mucous membranes are moist. Posterior pharynx clear of any exudate or lesions.Normal dentition.  Neck: normal, supple, no masses, no thyromegaly Respiratory: clear to auscultation bilaterally, no wheezing, no crackles. Normal respiratory effort. No accessory muscle use.  Cardiovascular: Regular rate and rhythm, no murmurs / rubs / gallops. No extremity edema. 2+ pedal pulses. No carotid bruits.  Abdomen: no tenderness, no masses palpated. No hepatosplenomegaly. Bowel sounds positive.  Musculoskeletal: no clubbing / cyanosis. No joint deformity upper and  lower extremities. Good ROM, no contractures. Normal muscle tone.  Skin: no rashes, lesions, ulcers. No induration Neurologic: Right-sided facial droop noted.  Power 0 out of 5 in right upper and lower extremity.  5 out of 5 in left upper and lower extremity.  Sensation intact. Psychiatric: Normal judgment and insight. Alert and oriented x 3. Normal mood.    Labs on Admission: I have personally reviewed following labs and imaging studies  CBC: Recent Labs  Lab 03/05/19 1302 03/05/19 1416  WBC 7.8  --   NEUTROABS 6.6  --   HGB 14.4 15.6  HCT 45.3 46.0  MCV 93.2  --   PLT 239  --    Basic Metabolic Panel: Recent Labs  Lab 03/05/19 1302 03/05/19 1416  NA 145 144  K 4.0 3.9  CL 104 104  CO2 27  --   GLUCOSE 209* 202*  BUN 17 20  CREATININE 1.12 1.00  CALCIUM 10.0  --    GFR: Estimated Creatinine Clearance: 79.9 mL/min (by C-G formula based on SCr of 1 mg/dL). Liver Function Tests: Recent Labs  Lab  03/05/19 1302  AST 19  ALT 39  ALKPHOS 103  BILITOT 0.6  PROT 7.2  ALBUMIN 4.0   No results for input(s): LIPASE, AMYLASE in the last 168 hours. No results for input(s): AMMONIA in the last 168 hours. Coagulation Profile: Recent Labs  Lab 03/05/19 1302  INR 1.1   Cardiac Enzymes: No results for input(s): CKTOTAL, CKMB, CKMBINDEX, TROPONINI in the last 168 hours. BNP (last 3 results) No results for input(s): PROBNP in the last 8760 hours. HbA1C: No results for input(s): HGBA1C in the last 72 hours. CBG: No results for input(s): GLUCAP in the last 168 hours. Lipid Profile: No results for input(s): CHOL, HDL, LDLCALC, TRIG, CHOLHDL, LDLDIRECT in the last 72 hours. Thyroid Function Tests: No results for input(s): TSH, T4TOTAL, FREET4, T3FREE, THYROIDAB in the last 72 hours. Anemia Panel: No results for input(s): VITAMINB12, FOLATE, FERRITIN, TIBC, IRON, RETICCTPCT in the last 72 hours. Urine analysis:    Component Value Date/Time   COLORURINE YELLOW 03/05/2019 1436   APPEARANCEUR CLOUDY (A) 03/05/2019 1436   LABSPEC 1.020 03/05/2019 1436   PHURINE 7.0 03/05/2019 1436   GLUCOSEU NEGATIVE 03/05/2019 1436   HGBUR NEGATIVE 03/05/2019 1436   BILIRUBINUR NEGATIVE 03/05/2019 1436   KETONESUR NEGATIVE 03/05/2019 1436   PROTEINUR 30 (A) 03/05/2019 1436   NITRITE NEGATIVE 03/05/2019 1436   LEUKOCYTESUR LARGE (A) 03/05/2019 1436    Radiological Exams on Admission: Ct Head Wo Contrast  Result Date: 03/05/2019 CLINICAL DATA:  Altered mental status EXAM: CT HEAD WITHOUT CONTRAST TECHNIQUE: Contiguous axial images were obtained from the base of the skull through the vertex without intravenous contrast. COMPARISON:  2019 FINDINGS: Brain: There is no acute hemorrhage or mass effect. There are large chronic infarcts involving the left PCA and MCA territories. There is new hypoattenuation involving the left thalamus. There is also new hypoattenuation within the medial left parietal lobe.  New small focus of hypoattenuation within the right cerebellum likely reflects age-indeterminate infarction. Additional hypoattenuation in the supratentorial matter is nonspecific but likely reflects stable chronic microvascular ischemic changes. There is increased ex vacuo dilatation of the lateral and third ventricles. Vascular: No hyperdense vessel. Atherosclerotic calcification at the skull base. Skull: Calvarium is unremarkable. Sinuses/Orbits: Patchy posterior left ethmoid mucosal thickening. Orbits are unremarkable. Other: Mastoid air cells are clear. IMPRESSION: New extension of left PCA territory infarction to involve thalamus and more of  the parietal lobe. This is likely subacute or chronic although a component of acute ischemia is difficult to exclude. Large chronic left MCA territory infarction. New age-indeterminate small right cerebellar infarct. No acute intracranial hemorrhage. Chronic microvascular ischemic changes. Electronically Signed   By: Guadlupe Spanish M.D.   On: 03/05/2019 14:47   Dg Chest Portable 1 View  Result Date: 03/05/2019 CLINICAL DATA:  68 year old male with vomiting and cough. Query aspiration. EXAM: PORTABLE CHEST 1 VIEW COMPARISON:  Portable chest 04/03/2018. FINDINGS: Portable AP semi upright view at 1315 hours. Stable somewhat low lung volumes. Mediastinal contours remain normal. Visualized tracheal air column is within normal limits. Stable lungs, mild apical scarring but otherwise allowing for portable technique the lungs are clear. Negative visible bowel gas pattern. No acute osseous abnormality identified. Chronic glenohumeral degeneration. IMPRESSION: No acute cardiopulmonary abnormality. Electronically Signed   By: Odessa Fleming M.D.   On: 03/05/2019 13:34    EKG: Normal sinus rhythm, no ST elevation or depression noted.  Assessment/Plan Principal Problem:   Ischemic stroke [facility]) Active Problems:   Essential hypertension   Dyslipidemia   Type 2 diabetes mellitus  with peripheral neuropathy (HCC)   UTI (urinary tract infection)   Altered mental status: -Could be secondary from new ischemic stroke versus UTI?  Patient is afebrile, no leukocytosis, UA is positive for leukocytes and bacteria, UDS is negative, blood culture, urine culture, COVID-19: pending.  Ethanol level: WNL.  Chest x-ray negative for acute findings. -CT head without contrast as above. -admit forTelemetry monitoring -Allow for permissive hypertension for the first 24-48h - only treat PRN if SBP >220 mmHg or diastolic blood pressure >120. Blood pressures can be gradually normalized to SBP<140 upon discharge. -MRI brain without contrast, carotid Doppler, transthoracic echo ordered and is pending. -Continue aspirin, statin, Plavix -Check lipid Panel, A1C -Frequent neuro checks.  Will keep him n.p.o. until he passes the bedside swallow evaluation. -Neurology consulted by EDP. -PT/OT eval, Speech consult  Hypertension: Stable -We will hold his home blood pressure medicines-amlodipine, clonidine, hydralazine, lisinopril and metoprolol to allow permissive hypertension. -Monitor blood pressure closely.  Hyperlipidemia: Check lipid panel -Continue statin.  Diabetes mellitus: Check A1c -Hold glipizide and Lantus for now-as patient is n.p.o. -Start on sliding scale insulin. -Monitor blood sugar closely.  UTI: -Patient treated with Keflex and recently finished antibiotic course. -UA positive for leukocytes and bacteria.  Urine culture is pending -Received IV Rocephin in the ED-we will continue for 2 more days.  BPH: Continue Flomax.   DVT prophylaxis: TED/SCD/Lovenox Code Status: Full code -confirmed with daughter  family Communication: Daughter present at bedside.  Plan of care discussed with patient's daughter in length and she verbalized understanding and agreed with it. Disposition Plan: TBD Consults called: Neurology by EDP Admission status: Inpatient   Ollen Bowl MD  [facility] Pager [phone]  If 7PM-7AM, please contact night-coverage [email or web address] Password [email or web address]  03/05/2019, 4:08 PM

## 2019-03-05 NOTE — ED Notes (Signed)
Daughter - Deavion Dobbs - leaving at this time - requests to be notified once pt has bed assigned - [phone].

## 2019-03-05 NOTE — ED Notes (Signed)
Pt has not arrived to room as pt is in MRI. Family member in room.

## 2019-03-05 NOTE — ED Notes (Signed)
Dr. Doristine Bosworth noitified that pt did not pass swallow screen - will have speech re-evaluate

## 2019-03-05 NOTE — ED Triage Notes (Signed)
Pt from home via ems; called out for near syncopal episode this am while eating breakfast; emesis x 1; hx stroke, residual deficits to R side and facial droop; incontinent, which family states is unusual; ems states repeated "yes" to questions means "no"; can normally assist with sitting and feeding; per family, last stoke pt had similar symptoms  155/86 P 96 100% RA T 98.1 F CBG 231

## 2019-03-06 ENCOUNTER — Inpatient Hospital Stay (HOSPITAL_COMMUNITY): Payer: Medicare Other

## 2019-03-06 ENCOUNTER — Encounter (HOSPITAL_COMMUNITY): Payer: Self-pay | Admitting: Neurology

## 2019-03-06 DIAGNOSIS — I639 Cerebral infarction, unspecified: Secondary | ICD-10-CM

## 2019-03-06 LAB — LIPID PANEL
Cholesterol: 119 mg/dL (ref 0–200)
HDL: 36 mg/dL — ABNORMAL LOW (ref >40)
LDL Cholesterol: 68 mg/dL (ref 0–99)
Total CHOL/HDL Ratio: 3.3 RATIO
Triglycerides: 76 mg/dL (ref <150)
VLDL: 15 mg/dL (ref 0–40)

## 2019-03-06 LAB — GLUCOSE, CAPILLARY
Glucose-Capillary: 126 mg/dL — ABNORMAL HIGH (ref 70–99)
Glucose-Capillary: 134 mg/dL — ABNORMAL HIGH (ref 70–99)
Glucose-Capillary: 142 mg/dL — ABNORMAL HIGH (ref 70–99)
Glucose-Capillary: 205 mg/dL — ABNORMAL HIGH (ref 70–99)
Glucose-Capillary: 156 mg/dL — ABNORMAL HIGH (ref 70–99)
Glucose-Capillary: 206 mg/dL — ABNORMAL HIGH (ref 70–99)

## 2019-03-06 LAB — HEMOGLOBIN A1C
Hgb A1c MFr Bld: 7.2 % — ABNORMAL HIGH (ref 4.8–5.6)
Mean Plasma Glucose: 159.94 mg/dL

## 2019-03-06 MED ORDER — ASPIRIN EC 325 MG PO TBEC
325.0000 mg | DELAYED_RELEASE_TABLET | Freq: Every day | ORAL | Status: DC
  Administered 2019-03-06 – 2019-03-12 (×7): 325 mg via ORAL
  Filled 2019-03-06 (×7): qty 1

## 2019-03-06 NOTE — Progress Notes (Signed)
Chaplain visited with patients as a result of a referral for prayer.  The daughter thought the prayer would do the patient some good.  The patient seemed to be joyful at receiving the prayer and taking a moment for the chaplain to visit.  The chaplain will follow-up as available.  Brion Aliment Chaplain Resident For questions concerning this note please contact me by pager [phone]

## 2019-03-06 NOTE — Consult Note (Addendum)
Neurology Consultation  Reason for Consult: Stroke Referring Physician: Lanae BoastKc, Ramesh, MD  CC: Episode of staring/confusion   History is obtained from: Chart  HPI: Ashok CordiaWilliam L Frickey Jr is a 68 y.o. 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  Patient presented to the emergency department due to altered mental status, episode of vomiting.  Patient was not acting like himself and was confused with staring episodes that last for approximately 1 minute in addition to incontinence, and then return back to baseline.  Daughter called EMS because she was concerned patient may have had another stroke.  Patient was recently treated with Keflex for cystitis and at that time it finished her antibiotics however UA was positive for leukocytosis and bacteria.  CT of head was obtained which read that there was an extension of the left PCA territory infarct that involve the thalamic region and parietal lobe.  It was recommended to emergency room physician is to obtain MRI brain.  MRI did not show the old infarct size increased but did show a 1 cm acute left corona radiata infarct.   It was for this reason neurology was consulted. Patient is unable to provide any history.  LKW: Unknown tpa given?: no, no last known normal Premorbid modified Rankin scale (mRS): 5 NIH stroke scale: 1a Level of Conscious.: 0 1b LOC Questions: 2 1c LOC Commands:2  2 Best Gaze: 0 3 Visual: 2 4 Facial Palsy:2  5a Motor Arm - left:0  5b Motor Arm - Right:3  6a Motor Leg - Left: 0 6b Motor Leg - Right:3  7 Limb Ataxia: 0 8 Sensory: 0 9 Best Language:3  10 Dysarthria: 2 11 Extinct. and Inatten.:0  TOTAL: 19    Past Medical History:  Diagnosis Date  . Diabetes mellitus without complication (HCC)   . Hypertension   . Stroke [facility](HCC)     Family History  Problem Relation Age of Onset  . Hypertension Mother   .  Hypertension Father     Social History:   reports that he has never smoked. He has never used smokeless tobacco. He reports previous alcohol use. He reports that he does not use drugs.  Medications  Current Facility-Administered Medications:  .   stroke: mapping our early stages of recovery book, , Does not apply, Once, Pahwani, Rinka R, MD .  acetaminophen (TYLENOL) tablet 650 mg, 650 mg, Oral, Q4H PRN, 650 mg at 03/06/19 1050 **OR** acetaminophen (TYLENOL) 160 MG/5ML solution 650 mg, 650 mg, Per Tube, Q4H PRN **OR** acetaminophen (TYLENOL) suppository 650 mg, 650 mg, Rectal, Q4H PRN, Pahwani, Rinka R, MD .  aspirin EC tablet 325 mg, 325 mg, Oral, Daily, Kc, Ramesh, MD .  atorvastatin (LIPITOR) tablet 80 mg, 80 mg, Oral, q1800, Pahwani, Rinka R, MD, Stopped at 03/05/19 1837 .  baclofen (LIORESAL) tablet 20 mg, 20 mg, Oral, BID, Pahwani, Rinka R, MD, 20 mg at 03/06/19 1032 .  cefTRIAXone (ROCEPHIN) 1 g in sodium chloride 0.9 % 100 mL IVPB, 1 g, Intravenous, Q24H, Pahwani, Rinka R, MD .  clopidogrel (PLAVIX) tablet 75 mg, 75 mg, Oral, Daily, Pahwani, Rinka R, MD, 75 mg at 03/06/19 1032 .  enoxaparin (LOVENOX) injection 40 mg, 40 mg, Subcutaneous, Q24H, Pahwani, Rinka R, MD, 40 mg at 03/05/19 2044 .  insulin aspart (novoLOG) injection 0-15 Units, 0-15 Units, Subcutaneous, TID WC, Pahwani, Rinka R, MD, 5 Units at 03/06/19 1202 .  insulin aspart (novoLOG) injection 0-5 Units,  0-5 Units, Subcutaneous, QHS, Pahwani, Rinka R, MD .  lamoTRIgine (LAMICTAL) tablet 100 mg, 100 mg, Oral, BID, Pahwani, Rinka R, MD, 100 mg at 03/06/19 1033 .  pantoprazole (PROTONIX) EC tablet 40 mg, 40 mg, Oral, Daily, Pahwani, Rinka R, MD, 40 mg at 03/06/19 1033 .  tamsulosin (FLOMAX) capsule 0.4 mg, 0.4 mg, Oral, QPC supper, Pahwani, Rinka R, MD, Stopped at 03/05/19 1837   Exam: Current vital signs: BP (!) 165/82 (BP Location: Left Arm)   Pulse 91   Temp (!) 97.4 F (36.3 C) (Axillary)   Resp 16   Ht 6\' 1"  (1.854  m)   Wt 82.6 kg   SpO2 100%   BMI 24.03 kg/m  Vital signs in last 24 hours: Temp:  [97.4 F (36.3 C)-99.2 F (37.3 C)] 97.4 F (36.3 C) (11/04 1155) Pulse Rate:  [85-107] 91 (11/04 1155) Resp:  [12-18] 16 (11/04 1155) BP: (152-194)/(82-91) 165/82 (11/04 1155) SpO2:  [98 %-100 %] 100 % (11/04 1155) Weight:  [82.6 kg] 82.6 kg (11/03 2330)  ROS: Unable to obtain as patient has significant expressive and receptive aphasia    Physical Exam   Constitutional: Appears well-developed and well-nourished.  Eyes: No scleral injection HENT: No OP obstrucion Head: Normocephalic.  Cardiovascular: Normal rate and regular rhythm.  Respiratory: Effort normal, non-labored breathing GI: Soft.  No distension. There is no tenderness.  Skin: WDI  Neuro: Mental Status: Patient has significant receptive and expressive aphasia.  He cannot follow verbal or visual commands.  When asked to follow commands patient will laugh and attempt to follow commands but cannot.  When asked to show 2 fingers or thumbs up, he keeps opening and closing his fist on the left. Cranial Nerves: II: Right hemianopsia III,IV, VI: EOMI without ptosis or diploplia. Pupils equal, round and reactive to light V: Winces on the left but not the right VII: Right facial droop VIII: hearing is intact to voice Motor: Flaccid on the right arm and leg however has 4+/5 strength on the left arm and leg Deep Tendon Reflexes: 2+ and symmetric in the biceps and patellae.  Plantars: Upgoing on the right downgoing on the left  Labs I have reviewed labs in epic and the results pertinent to this consultation are:   CBC    Component Value Date/Time   WBC 7.4 03/05/2019 1720   RBC 4.66 03/05/2019 1720   HGB 13.8 03/05/2019 1720   HCT 43.3 03/05/2019 1720   PLT 231 03/05/2019 1720   MCV 92.9 03/05/2019 1720   MCH 29.6 03/05/2019 1720   MCHC 31.9 03/05/2019 1720   RDW 13.2 03/05/2019 1720   LYMPHSABS 0.8 03/05/2019 1302   MONOABS  0.4 03/05/2019 1302   EOSABS 0.0 03/05/2019 1302   BASOSABS 0.0 03/05/2019 1302    CMP     Component Value Date/Time   NA 144 03/05/2019 1416   K 3.9 03/05/2019 1416   CL 104 03/05/2019 1416   CO2 27 03/05/2019 1302   GLUCOSE 202 (H) 03/05/2019 1416   BUN 20 03/05/2019 1416   CREATININE 0.94 03/05/2019 1720   CALCIUM 10.0 03/05/2019 1302   PROT 7.2 03/05/2019 1302   ALBUMIN 4.0 03/05/2019 1302   AST 19 03/05/2019 1302   ALT 39 03/05/2019 1302   ALKPHOS 103 03/05/2019 1302   BILITOT 0.6 03/05/2019 1302   GFRNONAA >60 03/05/2019 1720   GFRAA >60 03/05/2019 1720    Lipid Panel     Component Value Date/Time   CHOL 119 03/06/2019  0741   TRIG 76 03/06/2019 0741   HDL 36 (L) 03/06/2019 0741   CHOLHDL 3.3 03/06/2019 0741   VLDL 15 03/06/2019 0741   LDLCALC 68 03/06/2019 0741   LDL 68 A1c 7.2  Imaging I have reviewed the images obtained: CT-scan of the brain- official read - new extension of left PCA territory infarct to involve thalamus and more parietal lobe.  MRI examination of the brain-1 cm acute left corona radiata infarct.  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  Several chronic small vessel ischemic disease with numerous chronic lacunar infarcts  Echocardiogram shows left ventricular ejection fraction estimated greater than 75%.  Left ventricle has a hyperdynamic function.  There is no left ventricular hypertrophy.  Patient does have left ventricular diastolic parameters that are consistent with grade 1 diastolic dysfunction.  Felicie Morn PA-C [facility] [phone]  M-F  (9:00 am- 5:00 PM)  03/06/2019, 3:51 PM   Assessment:  68 year old male with history of large left MCA and PCA infarct presented to the ED secondary to confusion and incontinence.   MRI presents with a 1 cm acute left corona radiata infarct.  Given the history of prior stroke, it is possible that he might of suffered from a seizure, but the MRI was obtained because of a CT  scan finding concerning for expansion of the old stroke, which was not confirmed by the MRI but instead a new small acute infarct in the left corona radiata was seen. He would benefit from a stroke work-up and also an EEG to evaluate for any electrographic abnormalities, which might provide an impetus to start him on antiepileptics.  For now no antiepileptics.  Impression: -UTI -Evaluate for seizure -1 cm acute left corona radiata infarct  Recommend #Unclear if he ever got a loop recorder or extended heart monitoring done or not because the plan on last discharge was to follow-up in [place] and get a loop recorder.  Consider placing loop recorder during this admission. #Continue DAPT  #Start Atorvastatin 80 mg/other high intensity statin # BP goal: permissive HTN upto 220/120 mmHg # Telemetry monitoring # Frequent neuro checks # NPO until passes stroke swallow screen #EEG. If abnormal, consider AED.  # please page stroke NP  Or  PA  Or MD from 8am -4 pm  as this patient from this time will be  followed by the stroke.   You can look them up on [email or web address]  Password [facility]   Attending Neurohospitalist Addendum Patient seen and examined with APP/Resident. Agree with the history and physical as documented above. Agree with the plan as documented, which I helped formulate. I have independently reviewed the chart, obtained history, review of systems and examined the patient.I have personally reviewed pertinent head/neck/spine imaging (CT/MRI). Please feel free to call with any questions. --- Milon Dikes, MD [facility] Pager: [phone]  If 7pm to 7am, please call on call as listed on AMION.

## 2019-03-06 NOTE — Evaluation (Addendum)
Physical Therapy Evaluation Patient Details Name: Douglas [name] MRN: [id number] DOB: February 08, 1951 Today's Date: 03/06/2019   History of Present Illness   Douglas [name] is a 68 y.o. 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 MRI showing 1 cm acute L corona radiata, chronic L MCA & PCA.   Clinical Impression  Pt admitted with above diagnosis. Pt was discharged from CIR in January 2020 at a supervision level for wheelchair mobility and maximal assist level for transfers. He presents with aphasia, right inattention, right hemiparesis, impaired tone, and decreased cognition. Pt requiring moderate assist for bed mobility and unable to stand. Pt currently with functional limitations due to the deficits listed below (see PT Problem List). Pt will benefit from skilled PT to increase their independence and safety with mobility to allow discharge to the venue listed below.       Follow Up Recommendations SNF;Supervision/Assistance - 24 hour     Equipment Recommendations  Other (comment)(TBA)    Recommendations for Other Services       Precautions / Restrictions Precautions Precautions: Fall;Other (comment) Precaution Comments: R hemiparesis, increased tone Restrictions Weight Bearing Restrictions: No      Mobility  Bed Mobility Overal bed mobility: Needs Assistance Bed Mobility: Rolling;Sidelying to Sit Rolling: Mod assist Sidelying to sit: Mod assist       General bed mobility comments: ModA to roll towards right, cues for bending left knee and reaching for rail with left arm, assist for RLE negotiation and trunk elevation. Rolling once supine to place bed pan as well.  Transfers Overall transfer level: Needs assistance Equipment used: None Transfers: Sit to/from Stand Sit to Stand: Total assist         General transfer comment:  TotalA for minimal hip clearance to scoot up in bed, unable to achieve upright  Ambulation/Gait             General Gait Details: unable at baseline  Stairs            Wheelchair Mobility    Modified Rankin (Stroke Patients Only) Modified Rankin (Stroke Patients Only) Pre-Morbid Rankin Score: Severe disability Modified Rankin: Severe disability     Balance Overall balance assessment: Needs assistance Sitting-balance support: Feet supported;Single extremity supported Sitting balance-Leahy Scale: Poor Sitting balance - Comments: Reliant on left arm support, close supervision for safety                                     Pertinent Vitals/Pain Pain Assessment: Faces Faces Pain Scale: Hurts a little bit Pain Location: generalized with certain movements, stating, "hey" Pain Descriptors / Indicators: Grimacing Pain Intervention(s): Monitored during session;Repositioned    Home Living Family/patient expects to be discharged to:: Private residence Living Arrangements: Children(daughter) Available Help at Discharge: Family;Available 24 hours/day Type of Home: Apartment Home Access: Level entry     Home Layout: One level Home Equipment: Walker - 2 wheels;Wheelchair - manual      Prior Function Level of Independence: Needs assistance         Comments: On d/c from CIR, pt propelling w/c at supervision level and requiring max assist for transfers     Hand Dominance        Extremity/Trunk Assessment   Upper Extremity Assessment Upper Extremity Assessment: Defer to OT evaluation;RUE deficits/detail RUE  Deficits / Details: Strength 0/5, PROM to ~90 degrees, increased tone    Lower Extremity Assessment Lower Extremity Assessment: RLE deficits/detail RLE Deficits / Details: Strength 0/5, increased tone       Communication   Communication: Expressive difficulties(aphasic, only responding "yes," and "hey.")  Cognition Arousal/Alertness:  Awake/alert Behavior During Therapy: WFL for tasks assessed/performed Overall Cognitive Status: Difficult to assess Area of Impairment: Following commands;Attention                   Current Attention Level: Sustained   Following Commands: Follows one step commands inconsistently       General Comments: Following ~75% of simple, one step commands with multimodal cueing. Has communication impairments and only able to respond "yes," and "hey," during session. Unable to communicate basic needs.       General Comments      Exercises     Assessment/Plan    PT Assessment Patient needs continued PT services  PT Problem List Decreased strength;Decreased range of motion;Decreased balance;Decreased mobility;Decreased cognition;Impaired tone       PT Treatment Interventions Functional mobility training;Therapeutic activities;Therapeutic exercise;Balance training;Neuromuscular re-education;Cognitive remediation;Patient/family education;Wheelchair mobility training    PT Goals (Current goals can be found in the Care Plan section)  Acute Rehab PT Goals Patient Stated Goal: unable PT Goal Formulation: Patient unable to participate in goal setting Time For Goal Achievement: 03/20/19 Potential to Achieve Goals: Fair    Frequency Min 3X/week   Barriers to discharge        Co-evaluation               AM-PAC PT "6 Clicks" Mobility  Outcome Measure Help needed turning from your back to your side while in a flat bed without using bedrails?: A Lot Help needed moving from lying on your back to sitting on the side of a flat bed without using bedrails?: A Lot Help needed moving to and from a bed to a chair (including a wheelchair)?: Total Help needed standing up from a chair using your arms (e.g., wheelchair or bedside chair)?: Total Help needed to walk in hospital room?: Total Help needed climbing 3-5 steps with a railing? : Total 6 Click Score: 8    End of Session  Equipment Utilized During Treatment: Gait belt Activity Tolerance: Patient tolerated treatment well Patient left: in bed;with call bell/phone within reach;with bed alarm set Nurse Communication: Other (comment)(pt on bed pan) PT Visit Diagnosis: Other abnormalities of gait and mobility (R26.89);Hemiplegia and hemiparesis Hemiplegia - Right/Left: Right Hemiplegia - dominant/non-dominant: Dominant Hemiplegia - caused by: Cerebral infarction    Time: 6213-0865 PT Time Calculation (min) (ACUTE ONLY): 26 min   Charges:   PT Evaluation $PT Eval Moderate Complexity: 1 Mod PT Treatments $Therapeutic Activity: 8-22 mins        Ellamae Sia, PT, DPT Acute Rehabilitation Services Pager [phone] Office [phone]   Willy Eddy 03/06/2019, 9:31 AM

## 2019-03-06 NOTE — Progress Notes (Signed)
Carotid duplex complete. Please see CV Proc tab for preliminary results. Staves, RVT 4:15 PM  03/06/2019

## 2019-03-06 NOTE — Progress Notes (Signed)
   03/05/19 2330  Vitals  Temp 99.2 F (37.3 C)  Temp Source Oral  BP (!) 194/87  MAP (mmHg) 113  BP Location Left Arm  BP Method Automatic  Patient Position (if appropriate) Lying  Pulse Rate 97  Pulse Rate Source Dinamap  Resp 18  Oxygen Therapy  SpO2 99 %  O2 Device Room Air  Height and Weight  Height 6\' 1"  (1.854 m)  Weight 82.6 kg  Type of Scale Used Bed  BSA (Calculated - sq m) 2.06 sq meters  BMI (Calculated) 24.03  Weight in (lb) to have BMI = 25 189.1  MEWS Score  MEWS RR 0  MEWS Pulse 0  MEWS Systolic 0  MEWS LOC 0  MEWS Temp 0  MEWS Score 0  MEWS Score Color Green   The patient is alert but unable assess his orientation due to expressive aphagia. No s/s of pain or any acute distress. The patient is oriented to his room, call bell/ascom and staff. Full assessment to epic completed with the help of his daughter Maudie Mercury. Will continue to monitor .

## 2019-03-06 NOTE — Evaluation (Signed)
Clinical/Bedside Swallow Evaluation Patient Details  Name: Douglas [name] MRN: [id number] Date of Birth: July 12, 1950  Today's Date: 03/06/2019 Time: SLP Start Time (ACUTE ONLY): 0910 SLP Stop Time (ACUTE ONLY): 0942 SLP Time Calculation (min) (ACUTE ONLY): 32 min  Past Medical History:  Past Medical History:  Diagnosis Date  . Diabetes mellitus without complication ([place])   . Hypertension   . Stroke [facility])    Past Surgical History:  Past Surgical History:  Procedure Laterality Date  . IR PATIENT EVAL TECH 0-60 MINS  03/30/2018  . TEE WITHOUT CARDIOVERSION N/A 04/02/2018   Procedure: TRANSESOPHAGEAL ECHOCARDIOGRAM (TEE);  Surgeon: Skeet Latch, MD;  Location: [facility] ENDOSCOPY;  Service: Cardiovascular;  Laterality: N/A;   HPI:  Douglas [name] is a 68 y.o. 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 MRI showing 1 cm acute L corona radiata, chronic L MCA & PCA.  Pt was on CIR in December 2019 following his left MCA and ACA CVA and was on a dys2/nectar diet upon dc and was working on communicating basic needs via multimodal communication.  Pt failed [place] as she stopped drinking water.   Assessment / Plan / Recommendation Clinical Impression  Pt presents with cognitive and sensorimotor dysphagia resulting in oral residuals, rapid rate of eating and concern for possible aspiration with liquids.  Trigeminal and facial nerve deficits present evidenced by decreased labial closure and right labial residuals of puree without pt awareness.  Pt has a h/o silent aspiration with thin liquid and did not pass Yale 3 ounce water test due to ceasing intake but also presenting with cough post-swallow.  Pt takes very large sequential bites without oral clearance first which will increase his aspiration risk thus recommend full supervision to maximize intake and airway  protection.   No family present at this time to establish baseline diet prior to admit.    Recommend dys3/nectar diet with full supervision and SLP will follow up for speech evaluation as well as po tolerance, indication for dietary advancement vs instrumental evaluation.  Posted signs in room for RN and pt's family. Skilled intervention included educating pt to recommendations and determining effective swallowing precautions to mitigate aspiration/dysphagia.   SLP Visit Diagnosis: Dysphagia, oropharyngeal phase (R13.12);Dysphagia, unspecified (R13.10)    Aspiration Risk  Moderate aspiration risk    Diet Recommendation Dysphagia 3 (Mech soft);Nectar-thick liquid(pt may have single ice chips with full supervision)   Liquid Administration via: Cup;Straw Medication Administration: Whole meds with puree Supervision: Patient able to self feed;Full supervision/cueing for compensatory strategies Compensations: Slow rate;Small sips/bites(use oral suction to clear mouth after po intake) Postural Changes: Remain upright for at least 30 minutes after po intake;Seated upright at 90 degrees    Other  Recommendations Oral Care Recommendations: Oral care QID Other Recommendations: Order thickener from pharmacy   Follow up Recommendations Other (comment);24 hour supervision/assistance(tbd)      Frequency and Duration min 1 x/week          Prognosis Prognosis for Safe Diet Advancement: Good      Swallow Study   General Date of Onset: 03/06/19 HPI: Douglas [name] is a 68 y.o. 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 MRI showing 1 cm acute L corona radiata, chronic L MCA & PCA.  Pt was on CIR  in December 2019 following his left MCA and ACA CVA and was on a dys2/nectar diet upon dc and was working on communicating basic needs via multimodal communication.   Pt failed [facility] as she stopped drinking water. Previous Swallow Assessment: MBS 04/2018 silent aspiration of thin- dys2/nectar Diet Prior to this Study: NPO Temperature Spikes Noted: No Respiratory Status: Room air History of Recent Intubation: No Behavior/Cognition: Alert;Cooperative;Other (Comment);Doesn't follow directions(decreased ability to follow directions due to his aphasia) Oral Cavity Assessment: Dry(viscous secretions) Oral Care Completed by SLP: Yes(provided pt with pasted toothbrush and he brushed his teeth independently, he also used oral suction to clear secretions) Oral Cavity - Dentition: Adequate natural dentition Vision: Functional for self-feeding Self-Feeding Abilities: Needs assist(will perform better with finger foods given right HP) Patient Positioning: Upright in bed Baseline Vocal Quality: Low vocal intensity(low vocal intensity when he does verbalize - only states single words) Volitional Cough: Cognitively unable to elicit Volitional Swallow: Unable to elicit    Oral/Motor/Sensory Function Overall Oral Motor/Sensory Function: Moderate impairment Facial ROM: Reduced right Facial Symmetry: Abnormal symmetry right Facial Strength: Reduced right Facial Sensation: Reduced right Lingual ROM: Reduced right Lingual Symmetry: (pt does not protrude tongue on command- motor planning difficulties) Lingual Sensation: Reduced Velum: Within Functional Limits Mandible: (dnt)   Ice Chips Ice chips: Within functional limits Presentation: Spoon   Thin Liquid Thin Liquid: Impaired Presentation: Cup;Self Fed;Spoon Pharyngeal  Phase Impairments: Cough - Delayed Other Comments: delayed cough x1 of 5 boluses, SLP can not rule out aspiration given h/o silent nature of aspiration    Nectar Thick Nectar Thick Liquid: Within functional limits Presentation: Cup;Straw;Self Fed   Honey Thick Honey Thick Liquid: Not tested   Puree Puree: Impaired Presentation: Spoon;Self Fed Oral  Phase Impairments: Reduced labial seal Oral Phase Functional Implications: Right anterior spillage Other Comments: pt takes large sequential boluses benefiting from verbal/visual cues to slow rate   Solid     Solid: Impaired Presentation: Self Fed Oral Phase Impairments: Reduced lingual movement/coordination;Impaired mastication Oral Phase Functional Implications: Prolonged oral transit;Other (comment);Impaired mastication(left posterior sulci residuals - provided pt with applesauce followed by oral suction to faciliate clearance) Other Comments: pt is impulsive and takes very large sequential boluses without clearing oral cavity first; rate control and use of puree followed by oral suction helpful to maximize airway protection      [name] 03/06/2019,10:21 AM  Donavan Burnet, MS [facility] SLP Acute Rehab Services Pager [phone] Office [phone]

## 2019-03-06 NOTE — Progress Notes (Signed)
PROGRESS NOTE    Douglas [name]  OEU:[id number] DOB: 10/18/1950 DOA: 03/05/2019 PCP: Patient, No Pcp Per   Brief Narrative: 68 year old male with history of stroke with residual right-sided weakness, aphasia, hypertension, diabetes presented to the ER due to altered mental status, episode of vomiting.  Apparently patient was not acting like himself was confused stating at her and episode lasted for extremity 1 minute before returning to the baseline.  Daughter called EMS and was concerned about discharge as patient had similar symptoms in 2019 when he had acute stroke, also had episode of urinary incontinence but no seizure fall head trauma fever chills.  Was recently treated with Keflex for cystitis and has finished antibiotics.  In ER blood pressure slightly elevated.  Ethanol: WNL.  CBC, CMP, troponin: WNL.  UA positive for leukocytes and bacteria's.  UDS negative.  Blood culture, urine culture and COVID-19 all were pending.  CT head was obtained which came back positive for new extension of left PCA territory infarction to involve thalamus and more of the parietal lobe-likely subacute or chronic?.  EDP consulted neurology.  Subjective: Seen and examined this morning. OVERNIGHT bp 140-190s, Tmax 99.2 WorkinG with PT per PT he is not moving rt le, on edge of bed, has stiffness on RUE with increased tone He responds only with "YES" and does not follow other commands Per daughter normally follows commands.   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 Severe chronic small vessel ischemic disease with numerous chronic lacunar infarcts as above" lipid profile with LDL 68, HDL low at 36, hba1c 7.1, on lipitor 80 mg and s/p asa 300 rectally x1.- now on plavix. tte stable. Carotid duplex  pending.Neurology was discussed on admission by EDP apparently but no consulted-I called and notified Dr Rory Percy today for consult and he will see him today.PT/OT speech consult n.p.o. until speech clearance-placed on dysphagia 3 diet per speech..  History of stroke with right-sided weakness/aphasia:Patient had previously on 325 aspirin,Plavix 75, lamictal, Ritalin.At baseline able to transfer with help from daughter at home, able to say few words like "hello", "tv" etcs.  UTI POA urine culture with Proteus.Recently treated with Keflex.  Continue ceftriaxone x2 more days, follow-up urine culture. Hemodynamically  Stable,is afebrile and no leukocytosis currently.  Essential hypertension: Hold antihypertensives allow permissive hypertension.  Patient is on amlodipine, clonidine, hydralazine, lisinopril and metoprolol.  Dyslipidemia:Lipid profile stable,Cont Lipitor  Type 2 diabetes mellitus with peripheral neuropathy : Hemoglobin A1c 7.2 today.  Hold glipizide Lantus as patient is n.p.o.  Continue sliding scale insulin and monitor glucose.  BPH continue on Flomax.  Body mass index is 24.03 kg/m.   DVT prophylaxis: SCD/lovenox Code Status:Full Family Communication: No family at bedside this morning.I called and updated patient's daughter.   Disposition Plan: Remains inpatient pending clinical improvement and further work up of acute stroke. He would like him to go to SNF if he qualifies or if needed.  Consultants:  Neuro  Procedures:  MRI BRAIN: 1. 1 cm acute left corona radiata infarct. 2. 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 3. Severe chronic small vessel ischemic disease with numerous chronic lacunar infarcts as above.  TTE  1. Left ventricular ejection fraction, by visual estimation, is >75%. The left ventricle has hyperdynamic function. There is no left ventricular hypertrophy.  2. Left  ventricular diastolic parameters are consistent with Grade I diastolic dysfunction  (impaired relaxation).  3. Global right ventricle has normal systolic function.The right ventricular size is normal.  4. Left atrial size was normal.  5. Right atrial size was normal.  6. The mitral valve is normal in structure. Trace mitral valve regurgitation. No evidence of mitral stenosis.  7. The tricuspid valve is normal in structure. Tricuspid valve regurgitation is trivial.  8. The aortic valve is tricuspid. Aortic valve regurgitation is not visualized. Mild aortic valve sclerosis without stenosis.  9. The pulmonic valve was normal in structure. Pulmonic valve regurgitation is not visualized. 10. The inferior vena cava is normal in size with greater than 50% respiratory variability, suggesting right atrial pressure of 3 mmHg. 11. Hyperdynamic LV systolic function; grade 1 diastolic dysfunction; trace MR and TR.  Microbiology:  Antimicrobials: Anti-infectives (From admission, onward)   Start     Dose/Rate Route Frequency Ordered Stop   03/06/19 1500  cefTRIAXone (ROCEPHIN) 1 g in sodium chloride 0.9 % 100 mL IVPB     1 g 200 mL/hr over 30 Minutes Intravenous Every 24 hours 03/05/19 1607     03/05/19 1530  cefTRIAXone (ROCEPHIN) 1 g in sodium chloride 0.9 % 100 mL IVPB     1 g 200 mL/hr over 30 Minutes Intravenous  Once 03/05/19 1523 03/05/19 1636       Objective: Vitals:   03/05/19 2200 03/05/19 2330 03/06/19 0209 03/06/19 0415  BP: (!) 172/91 (!) 194/87 (!) 172/88 (!) 158/83  Pulse: 97 97  95  Resp: 14 18 16 14   Temp:  99.2 F (37.3 C) 98.9 F (37.2 C) 98.4 F (36.9 C)  TempSrc:  Oral Oral Oral  SpO2: 98% 99% 99% 98%  Weight:  82.6 kg    Height:  6\' 1"  (1.854 m)      Intake/Output Summary (Last 24 hours) at 03/06/2019 0733 Last data filed at 03/06/2019 [id number] Gross per 24 hour  Intake 600.12 ml  Output 202 ml  Net 398.12 ml   Filed Weights   03/05/19 1236 03/05/19 2330  Weight: 82.6 kg 82.6 kg   Weight change:   Body mass index is 24.03  kg/m.  Intake/Output from previous day: 11/03 0701 - 11/04 0700 In: 600.1 [IV Piggyback:600.1] Out: 202 [Urine:201; Stool:1] Intake/Output this shift: No intake/output data recorded.  Examination:  General exam: AA, responds only w yes, HEENT:Oral mucosa moist, Ear/Nose WNL grossly, dentition normal. Respiratory system: Diminished at the base,no wheezing or crackles,no use of accessory muscle Cardiovascular system: S1 & S2 +, No JVD,. Gastrointestinal system: Abdomen soft, NT,ND, BS+ Nervous System:Alert, awake, not following commands, good strength on LLE but weak rt side with incresed tone Extremities: No edema, distal peripheral pulses palpable.  Skin: No rashes,no icterus. MSK: Normal muscle bulk,tone, power  Medications:  Scheduled Meds:   stroke: mapping our early stages of recovery book   Does not apply Once   atorvastatin  80 mg Oral q1800   baclofen  20 mg Oral BID   clopidogrel  75 mg Oral Daily   enoxaparin (LOVENOX) injection  40 mg Subcutaneous Q24H   insulin aspart  0-15 Units Subcutaneous TID WC   insulin aspart  0-5 Units Subcutaneous QHS   lamoTRIgine  100 mg Oral BID   pantoprazole  40 mg Oral Daily   tamsulosin  0.4 mg Oral QPC supper   Continuous Infusions:  cefTRIAXone (ROCEPHIN)  IV      Data Reviewed: I have personally  reviewed following labs and imaging studies  CBC: Recent Labs  Lab 03/05/19 1302 03/05/19 1416 03/05/19 1720  WBC 7.8  --  7.4  NEUTROABS 6.6  --   --   HGB 14.4 15.6 13.8  HCT 45.3 46.0 43.3  MCV 93.2  --  92.9  PLT 239  --  231   Basic Metabolic Panel: Recent Labs  Lab 03/05/19 1302 03/05/19 1416 03/05/19 1720  NA 145 144  --   K 4.0 3.9  --   CL 104 104  --   CO2 27  --   --   GLUCOSE 209* 202*  --   BUN 17 20  --   CREATININE 1.12 1.00 0.94  CALCIUM 10.0  --   --    GFR: Estimated Creatinine Clearance: 85 mL/min (by C-G formula based on SCr of 0.94 mg/dL). Liver Function Tests: Recent Labs   Lab 03/05/19 1302  AST 19  ALT 39  ALKPHOS 103  BILITOT 0.6  PROT 7.2  ALBUMIN 4.0   No results for input(s): LIPASE, AMYLASE in the last 168 hours. No results for input(s): AMMONIA in the last 168 hours. Coagulation Profile: Recent Labs  Lab 03/05/19 1302  INR 1.1   Cardiac Enzymes: No results for input(s): CKTOTAL, CKMB, CKMBINDEX, TROPONINI in the last 168 hours. BNP (last 3 results) No results for input(s): PROBNP in the last 8760 hours. HbA1C: Recent Labs    03/05/19 1720  HGBA1C 7.1*   CBG: Recent Labs  Lab 03/05/19 1703 03/05/19 1719 03/05/19 2129 03/06/19 0335  GLUCAP 153* 159* 131* 134*   Lipid Profile: No results for input(s): CHOL, HDL, LDLCALC, TRIG, CHOLHDL, LDLDIRECT in the last 72 hours. Thyroid Function Tests: No results for input(s): TSH, T4TOTAL, FREET4, T3FREE, THYROIDAB in the last 72 hours. Anemia Panel: No results for input(s): VITAMINB12, FOLATE, FERRITIN, TIBC, IRON, RETICCTPCT in the last 72 hours. Sepsis Labs: No results for input(s): PROCALCITON, LATICACIDVEN in the last 168 hours.  Recent Results (from the past 240 hour(s))  SARS CORONAVIRUS 2 (TAT 6-24 HRS) Nasopharyngeal Nasopharyngeal Swab     Status: None   Collection Time: 03/05/19  3:24 PM   Specimen: Nasopharyngeal Swab  Result Value Ref Range Status   SARS Coronavirus 2 NEGATIVE NEGATIVE Final    Comment: (NOTE) SARS-CoV-2 target nucleic acids are NOT DETECTED. The SARS-CoV-2 RNA is generally detectable in upper and lower respiratory specimens during the acute phase of infection. Negative results do not preclude SARS-CoV-2 infection, do not rule out co-infections with other pathogens, and should not be used as the sole basis for treatment or other patient management decisions. Negative results must be combined with clinical observations, patient history, and epidemiological information. The expected result is Negative. Fact Sheet for  Patients: HairSlick.no Fact Sheet for Healthcare Providers: quierodirigir.com This test is not yet approved or cleared by the [place] FDA and  has been authorized for detection and/or diagnosis of SARS-CoV-2 by FDA under an Emergency Use Authorization (EUA). This EUA will remain  in effect (meaning this test can be used) for the duration of the COVID-19 declaration under Section 56 4(b)(1) of the Act, 21 U.S.C. section 360bbb-3(b)(1), unless the authorization is terminated or revoked sooner. Performed at [facility], [address] [place], [place] [address]       Radiology Studies: Ct Head Wo Contrast  Result Date: 03/05/2019 CLINICAL DATA:  Altered mental status EXAM: CT HEAD WITHOUT CONTRAST TECHNIQUE: Contiguous axial images were obtained from the  base of the skull through the vertex without intravenous contrast. COMPARISON:  2019 FINDINGS: Brain: There is no acute hemorrhage or mass effect. There are large chronic infarcts involving the left PCA and MCA territories. There is new hypoattenuation involving the left thalamus. There is also new hypoattenuation within the medial left parietal lobe. New small focus of hypoattenuation within the right cerebellum likely reflects age-indeterminate infarction. Additional hypoattenuation in the supratentorial matter is nonspecific but likely reflects stable chronic microvascular ischemic changes. There is increased ex vacuo dilatation of the lateral and third ventricles. Vascular: No hyperdense vessel. Atherosclerotic calcification at the skull base. Skull: Calvarium is unremarkable. Sinuses/Orbits: Patchy posterior left ethmoid mucosal thickening. Orbits are unremarkable. Other: Mastoid air cells are clear. IMPRESSION: New extension of left PCA territory infarction to involve thalamus and more of the parietal lobe. This is likely subacute or chronic although a component of acute  ischemia is difficult to exclude. Large chronic left MCA territory infarction. New age-indeterminate small right cerebellar infarct. No acute intracranial hemorrhage. Chronic microvascular ischemic changes. Electronically Signed   By: Guadlupe Spanish M.D.   On: 03/05/2019 14:47   Mr Brain Wo Contrast  Result Date: 03/05/2019 CLINICAL DATA:  Altered level of consciousness, unexplained. Unresponsive episode. EXAM: MRI HEAD WITHOUT CONTRAST TECHNIQUE: Multiplanar, multiecho pulse sequences of the brain and surrounding structures were obtained without intravenous contrast. COMPARISON:  Head CT 03/05/2019 and MRI 03/31/2018 FINDINGS: Brain: Large chronic infarcts are again seen involving the left MCA and left PCA territories with associated chronic hemosiderin deposition, ex vacuo dilatation of the left lateral ventricle, and wallerian degeneration extending into the brainstem. A 1 cm acute infarct is noted in the left corona radiata. Numerous chronic microhemorrhages are again seen in the deep gray nuclei, brainstem, and cerebellum compatible with chronic hypertension. There is a background of severe chronic small vessel ischemic disease in the cerebral white matter including chronic lacunar infarcts in the right corona radiata and deep gray nuclei as well as pons and cerebellum. A chronic lacunar infarct in the right cerebellar hemisphere is new from the prior MRI. There is mild leftward midline shift related to left cerebral hemispheric volume loss. No mass or extra-axial fluid collection is identified. Vascular: Abnormal appearance of the left MCA associated with the chronic infarct. Other major intracranial vascular flow voids are preserved. Skull and upper cervical spine: Unremarkable bone marrow signal. Sinuses/Orbits: Unremarkable orbits. Paranasal sinuses and mastoid air cells are clear. Other: None. IMPRESSION: 1. 1 cm acute left corona radiata infarct. 2. 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 3.  Severe chronic small vessel ischemic disease with numerous chronic lacunar infarcts as above. Electronically Signed   By: Sebastian Ache M.D.   On: 03/05/2019 18:38   Dg Chest Portable 1 View  Result Date: 03/05/2019 CLINICAL DATA:  68 year old male with vomiting and cough. Query aspiration. EXAM: PORTABLE CHEST 1 VIEW COMPARISON:  Portable chest 04/03/2018. FINDINGS: Portable AP semi upright view at 1315 hours. Stable somewhat low lung volumes. Mediastinal contours remain normal. Visualized tracheal air column is within normal limits. Stable lungs, mild apical scarring but otherwise allowing for portable technique the lungs are clear. Negative visible bowel gas pattern. No acute osseous abnormality identified. Chronic glenohumeral degeneration. IMPRESSION: No acute cardiopulmonary abnormality. Electronically Signed   By: Odessa Fleming M.D.   On: 03/05/2019 13:34      LOS: 1 day   Time spent: More than 50% of that time was spent in counseling and/or coordination of care.  Lanae Boast, MD [facility]  03/06/2019, 7:33 AM

## 2019-03-06 NOTE — Progress Notes (Signed)
Occupational Therapy Evaluation Patient Details Name: Douglas [name] MRN: [id number] DOB: 1950/12/19 Today's Date: 03/06/2019    History of Present Illness  Douglas [name] is a 68 y.o. 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 MRI showing 1 cm acute L corona radiata, chronic L MCA & PCA.    Clinical Impression   PTA, pt lived with daughter in her apartment and required assist from [place] for bathing, dressing, and transfers to [facility]. Pt currently presents with R hemiplegia/hemiparesis, decreased balance, activity tolerance, ROM, and impaired communication and cognition. Pt has decreased attention, difficulty following simple commands, and communicating basic needs. Pt required mod A and VCs for hand hygiene, and min A with VCs for washing his face while sitting EOB. Pt performed sit<>stand with total A with minimal hip clearance. Pt would benefit from continued OT services acutely to address deficits and facilitate safe dc. Recommend dc to SNF to increase safe performance of ADLs.     Follow Up Recommendations  SNF;Supervision/Assistance - 24 hour    Equipment Recommendations  Other (comment)(defer to next venue)    Recommendations for Other Services PT consult     Precautions / Restrictions Precautions Precautions: Fall;Other (comment) Precaution Comments: R hemiparesis, increased tone Restrictions Weight Bearing Restrictions: No      Mobility Bed Mobility Overal bed mobility: Needs Assistance Bed Mobility: Supine to Sit;Sit to Supine Rolling: Mod assist Sidelying to sit: Mod assist Supine to sit: Mod assist;HOB elevated Sit to supine: Mod assist   General bed mobility comments: Pt required mod A to bring legs to EOB. Pt initiated trunk elevation and rotation, using LUE to pull on bed rail  Transfers Overall transfer level: Needs  assistance Equipment used: 1 person hand held assist Transfers: Sit to/from Stand Sit to Stand: Total assist         General transfer comment: TotalA for minimal hip clearance, unable to achieve upright    Balance Overall balance assessment: Needs assistance Sitting-balance support: Feet supported;No upper extremity supported Sitting balance-Leahy Scale: Fair Sitting balance - Comments: able to maintain sitting EOB for ~10 minutes                                   ADL either performed or assessed with clinical judgement   ADL Overall ADL's : Needs assistance/impaired Eating/Feeding: Supervision/ safety;Sitting;Set up Eating/Feeding Details (indicate cue type and reason): Able to feed self with set up assist, requires supervision to ensure clearing of mouth Grooming: Wash/dry hands;Wash/dry face;Set up;Moderate assistance;Sitting Grooming Details (indicate cue type and reason): Pt required VCs and set up to wash face. Pt was cued to wash R hand, but pt perseverating on washing face. Pt required mod VCs and mod A to wash R hand.  Upper Body Bathing: Maximal assistance;Sitting   Lower Body Bathing: Total assistance;Bed level   Upper Body Dressing : Maximal assistance;Sitting   Lower Body Dressing: Total assistance;Bed level   Toilet Transfer: Total assistance;+2 for physical assistance;+2 for safety/equipment;Squat-pivot;BSC   Toileting- Clothing Manipulation and Hygiene: Total assistance;Bed level         General ADL Comments: Pt requiring mod-max A for UB ADLs, and total A for LB ADLs.      Vision         Perception     Praxis  Pertinent Vitals/Pain Pain Assessment: No/denies pain Faces Pain Scale: Hurts a little bit Pain Location: pt's stereotypical utterance is "hey"- no indication of pain with self feeding and brushing teeth, etc Pain Descriptors / Indicators: Grimacing Pain Intervention(s): Monitored during session     Hand Dominance  Right   Extremity/Trunk Assessment Upper Extremity Assessment Upper Extremity Assessment: RUE deficits/detail RUE Deficits / Details: Pt presenting with increased flexor tone of RUE. limited PROM for wrist and composite extension. pt able to use LUE to stretch R hand.  RUE Coordination: decreased fine motor;decreased gross motor   Lower Extremity Assessment Lower Extremity Assessment: Defer to PT evaluation RLE Deficits / Details: Strength 0/5, increased tone   Cervical / Trunk Assessment Cervical / Trunk Assessment: Normal   Communication Communication Communication: Expressive difficulties   Cognition Arousal/Alertness: Awake/alert Behavior During Therapy: WFL for tasks assessed/performed Overall Cognitive Status: History of cognitive impairments - at baseline Area of Impairment: Following commands;Safety/judgement;Awareness;Problem solving;Attention                   Current Attention Level: Sustained   Following Commands: Follows one step commands inconsistently;Follows one step commands with increased time Safety/Judgement: Decreased awareness of safety;Decreased awareness of deficits Awareness: Emergent Problem Solving: Slow processing;Requires verbal cues;Requires tactile cues;Difficulty sequencing General Comments: Pt required increased time and VCs to follow simple commands. Pt perseverating on washing face with wash cloth. Pt only able to verbalize "hey" and "yes" throughout session.    General Comments  Pt daughter came in at end of session, informed us that he required assist before, however was previously able to engage more actively in ADLs and transfers    Exercises     Shoulder Instructions      Home Living Family/patient expects to be discharged to:: Private residence Living Arrangements: Children Available Help at Discharge: Family;Available 24 hours/day;Other (Comment)(PCA) Type of Home: Apartment Home Access: Level entry     Home Layout: One  level               Home Equipment: Walker - 2 wheels;Bedside commode;Cane - single point;Shower seat;Wheelchair - manual;Hospital bed;Other (comment)(Hemi walker)          Prior Functioning/Environment Level of Independence: Needs assistance  Gait / Transfers Assistance Ne[facility]: [facility] / [facility] Assistance Needed: Requires assist from PCA for ADLs   Comments: On d/c from CIR, pt propelling w/c at supervision level and requiring max assist for transfers        OT Problem List: Decreased strength;Decreased range of motion;Decreased activity tolerance;Impaired balance (sitting and/or standing);Decreased cognition;Decreased coordination;Decreased safety awareness;Decreased knowledge of use of DME or AE;Decreased knowledge of precautions;Impaired sensation;Impaired tone;Impaired UE functional use;Pain      OT Treatment/Interventions: Self-care/ADL training;DME and/or AE instruction;Therapeutic activities;Cognitive remediation/compensation;Patient/family education;Balance training    OT Goals(Current goals can be found in the care plan section) Acute Rehab OT Goals Patient Stated Goal: unable OT Goal Formulation: With patient Time For Goal Achievement: 03/20/19 Potential to Achieve Goals: Good  OT Frequency: Min 2X/week   Barriers to D/C:            Co-evaluation              AM-PAC OT "6 Clicks" Daily Activity     Outcome Measure Help from another person eating meals?: A Little Help from another person taking care of personal grooming?: A Lot Help from another person toileting, which includes using toliet, bedpan, or urinal?: A Lot Help from another person  bathing (including washing, rinsing, drying)?: Total Help from another person to put on and taking off regular upper body clothing?: A Lot Help from another person to put on and taking off regular lower body clothing?: Total 6 Click Score: 11   End of Session Equipment Utilized  During Treatment: Gait belt Nurse Communication: Mobility status;Other (comment)(Pt experienced bowel movement at end of session)  Activity Tolerance: Patient tolerated treatment well Patient left: in bed;with call bell/phone within reach;with bed alarm set  OT Visit Diagnosis: Unsteadiness on feet (R26.81);Other abnormalities of gait and mobility (R26.89);Muscle weakness (generalized) (M62.81);Other symptoms and signs involving cognitive function;Cognitive communication deficit (R41.841);Hemiplegia and hemiparesis;Pain Symptoms and signs involving cognitive functions: Cerebral infarction Hemiplegia - Right/Left: Right Hemiplegia - dominant/non-dominant: Dominant Hemiplegia - caused by: Cerebral infarction                Time: 8850-2774 OT Time Calculation (min): 34 min Charges:  OT General Charges $OT Visit: 1 Visit OT Evaluation $OT Eval Moderate Complexity: 1 Mod OT Treatments $Self Care/Home Management : 8-22 mins  Sandrea Hammond, OT Student  Mardene Celeste Aliyanna Wassmer 03/06/2019, 12:16 PM

## 2019-03-07 ENCOUNTER — Inpatient Hospital Stay (HOSPITAL_COMMUNITY): Payer: Medicare Other

## 2019-03-07 DIAGNOSIS — R569 Unspecified convulsions: Secondary | ICD-10-CM

## 2019-03-07 DIAGNOSIS — Z8673 Personal history of transient ischemic attack (TIA), and cerebral infarction without residual deficits: Secondary | ICD-10-CM

## 2019-03-07 DIAGNOSIS — R4182 Altered mental status, unspecified: Secondary | ICD-10-CM

## 2019-03-07 LAB — GLUCOSE, CAPILLARY
Glucose-Capillary: 156 mg/dL — ABNORMAL HIGH (ref 70–99)
Glucose-Capillary: 192 mg/dL — ABNORMAL HIGH (ref 70–99)
Glucose-Capillary: 278 mg/dL — ABNORMAL HIGH (ref 70–99)
Glucose-Capillary: 184 mg/dL — ABNORMAL HIGH (ref 70–99)

## 2019-03-07 LAB — URINE CULTURE: Culture: >=100000 — AB

## 2019-03-07 MED ORDER — LEVETIRACETAM 500 MG PO TABS
500.0000 mg | ORAL_TABLET | Freq: Two times a day (BID) | ORAL | Status: DC
  Administered 2019-03-07 – 2019-03-12 (×10): 500 mg via ORAL
  Filled 2019-03-07 (×10): qty 1

## 2019-03-07 MED ORDER — ATORVASTATIN CALCIUM 40 MG PO TABS
40.0000 mg | ORAL_TABLET | Freq: Every day | ORAL | Status: DC
  Administered 2019-03-08 – 2019-03-11 (×4): 40 mg via ORAL
  Filled 2019-03-07 (×4): qty 1

## 2019-03-07 MED ORDER — IOHEXOL 350 MG/ML SOLN
75.0000 mL | Freq: Once | INTRAVENOUS | Status: AC | PRN
  Administered 2019-03-07: 75 mL via INTRAVENOUS

## 2019-03-07 NOTE — Procedures (Signed)
Patient Name: Arth Nicastro  MRN: [id number]  Epilepsy Attending: Lora Havens  Referring Physician/Provider: Dr Amie Portland Date: 03/07/2019 Duration: 21.13 mins  Patient history: 68yo M with left MCA and PCA infarct presented to the ED secondary to confusion and incontinence.  EEG to evaluate for seizure.  Level of alertness: awake  AEDs during EEG study: LTG  Technical aspects: This EEG study was done with scalp electrodes positioned according to the 10-20 International system of electrode placement. Electrical activity was acquired at a sampling rate of 500Hz  and reviewed with a high frequency filter of 70Hz  and a low frequency filter of 1Hz . EEG data were recorded continuously and digitally stored.   DESCRIPTION: The posterior dominant rhythm consists of 9-10 Hz activity of moderate voltage (25-35 uV) seen predominantly in posterior head regions, symmetric and reactive to eye opening and eye closing. EEG also showed continuous low voltage 5-6hz  theta slowing in left hemisphere. Hyperventilation and photic stimulation were not performed.  ABNORMALITY - Continuous slow, left hemisphere  IMPRESSION: This study is suggestive of cortical dysfunction in left hemisphere, likely secondary to underlying stroke. No seizures or epileptiform discharges were seen throughout the recording.  Lavel Rieman Barbra Sarks

## 2019-03-07 NOTE — Progress Notes (Signed)
PROGRESS NOTE    Douglas [name]  ZOX:[id number] DOB: 1950-07-04 DOA: 03/05/2019 PCP: Patient, No Pcp Per   Brief Narrative: 68 year old male with history of stroke with residual right-sided weakness, aphasia, hypertension, diabetes presented to the ER due to altered mental status, episode of vomiting.  Apparently patient was not acting like himself was confused stating at her and episode lasted for extremity 1 minute before returning to the baseline.  Daughter called EMS and was concerned about discharge as patient had similar symptoms in 2019 when he had acute stroke, also had episode of urinary incontinence but no seizure fall head trauma fever chills.  Was recently treated with Keflex for cystitis and has finished antibiotics.  In ER blood pressure slightly elevated.  Ethanol: WNL.  CBC, CMP, troponin: WNL.  UA positive for leukocytes and bacteria's.  UDS negative.  Blood culture, urine culture and COVID-19 all were pending.  CT head was obtained which came back positive for new extension of left PCA territory infarction to involve thalamus and more of the parietal lobe-likely subacute or chronic?.  EDP consulted neurology.  Subjective: Seen and examined this morning. T-max 99.  BP 150s to 170s. Per daughter normally follows commands speaks in few words. This morning responding with head nodding yes.  Getting EEG.  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 Severe chronic small vessel ischemic disease with numerous chronic lacunar infarcts as above" lipid profile with LDL 68, HDL low at 36, hba1c 7.1, on lipitor 80 mg and s/p asa 300 rectally x1- now on plavix and aspirin, and started on Lipitor 80 mg.tte stable.CT angio neck pending.  Appreciate neurology input-allow permissive  hypertension, monitor telemetry, neuro check PT OT and speech eval. Neuro plans on possible loop recorder. Placed on dysphagia 3 diet per speech.  Getting EEG to rule out seizure.  History of stroke with right-sided weakness/aphasia:Patient had previously on 325 aspirin,Plavix 75, lamictal, Ritalin.At baseline able to transfer with help from daughter at home, able to say few words like "hello", "tv" etcs.  UTI POA urine culture with Proteus.Recently treated with Keflex.  Continue ceftriaxone x2 more days, follow-up urine culture. Hemodynamically  Stable,is afebrile and no leukocytosis currently.  Essential hypertension: Allow permissive hypertension, hold medication.Patient is on amlodipine, clonidine, hydralazine, lisinopril and metoprolol, and will resume slowly once okay with neurology.  Dyslipidemia:Lipid profile stable, continue high intensity statin.    Type 2 diabetes mellitus with peripheral neuropathy : Hemoglobin A1c 7.2. Hold glipizide Lantus as patient is n.p.o.  Continue sliding scale insulin and monitor glucose.  BPH continue on Flomax.  Body mass index is 24.03 kg/m.   DVT prophylaxis: SCD/lovenox Code Status:Full Family Communication: No family at bedside.  I had called and discussed and updated patient's daughter.  Disposition Plan: Remains inpatient pending clinical improvement and further work up of acute stroke. Daughter would like him to go to SNF if he qualifies or if needed.  Anticipating skilled nursing facility placement.  Consultants:  Neuro  Procedures:  MRI BRAIN: 1. 1 cm acute left corona radiata infarct. 2. 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 3. Severe chronic small vessel ischemic disease with numerous chronic lacunar infarcts as above.  TTE  1. Left ventricular ejection fraction, by visual estimation, is >75%. The left ventricle has hyperdynamic function. There is no left  ventricular hypertrophy.  2. Left ventricular diastolic parameters  are consistent with Grade I diastolic dysfunction (impaired relaxation).  3. Global right ventricle has normal systolic function.The right ventricular size is normal.  4. Left atrial size was normal.  5. Right atrial size was normal.  6. The mitral valve is normal in structure. Trace mitral valve regurgitation. No evidence of mitral stenosis.  7. The tricuspid valve is normal in structure. Tricuspid valve regurgitation is trivial.  8. The aortic valve is tricuspid. Aortic valve regurgitation is not visualized. Mild aortic valve sclerosis without stenosis.  9. The pulmonic valve was normal in structure. Pulmonic valve regurgitation is not visualized. 10. The inferior vena cava is normal in size with greater than 50% respiratory variability, suggesting right atrial pressure of 3 mmHg. 11. Hyperdynamic LV systolic function; grade 1 diastolic dysfunction; trace MR and TR.  Microbiology:  Antimicrobials: Anti-infectives (From admission, onward)   Start     Dose/Rate Route Frequency Ordered Stop   03/06/19 1500  cefTRIAXone (ROCEPHIN) 1 g in sodium chloride 0.9 % 100 mL IVPB     1 g 200 mL/hr over 30 Minutes Intravenous Every 24 hours 03/05/19 1607     03/05/19 1530  cefTRIAXone (ROCEPHIN) 1 g in sodium chloride 0.9 % 100 mL IVPB     1 g 200 mL/hr over 30 Minutes Intravenous  Once 03/05/19 1523 03/05/19 1636       Objective: Vitals:   03/06/19 2029 03/06/19 2303 03/07/19 0414 03/07/19 0734  BP: (!) 164/92 (!) 155/89 (!) 151/94 (!) 155/95  Pulse: 89 89 85 80  Resp: Temp: 97.8 F (36.6 C) 99 F (37.2 C) (!) 97.4 F (36.3 C) 98.5 F (36.9 C)  TempSrc: Oral Oral Oral Oral  SpO2: 100% 98% 100% 99%  Weight:      Height:        Intake/Output Summary (Last 24 hours) at 03/07/2019 0858 Last data filed at 03/07/2019 0300 Gross per 24 hour  Intake --  Output 500 ml  Net -500 ml   Filed Weights   03/05/19 1236 03/05/19 2330  Weight: 82.6 kg 82.6 kg   Weight change:    Body mass index is 24.03 kg/m.  Intake/Output from previous day: 11/04 0701 - 11/05 0700 In: -  Out: 500 [Urine:500] Intake/Output this shift: No intake/output data recorded.  Examination:  General exam: Alert awake not in distress pleasant, responds with yes and nods.   HEENT:Oral mucosa moist, Ear/Nose WNL grossly, dentition normal. Respiratory system: Diminished at the base,no wheezing or crackles,no use of accessory muscle Cardiovascular system: S1 & S2 +, No JVD,. Gastrointestinal system: Abdomen soft, NT,ND, BS+ Nervous System:Alert, awake, responds with yes, chronic right-sided weakness, good strength on the left side.   Extremities: No edema, distal peripheral pulses palpable.  Skin: No rashes,no icterus. MSK: Normal muscle bulk,tone, power  Medications:  Scheduled Meds:   stroke: mapping our early stages of recovery book   Does not apply Once   aspirin  325 mg Oral Daily   atorvastatin  80 mg Oral q1800   baclofen  20 mg Oral BID   clopidogrel  75 mg Oral Daily   enoxaparin (LOVENOX) injection  40 mg Subcutaneous Q24H   insulin aspart  0-15 Units Subcutaneous TID WC   insulin aspart  0-5 Units Subcutaneous QHS   lamoTRIgine  100 mg Oral BID   pantoprazole  40 mg Oral Daily   tamsulosin  0.4 mg Oral QPC supper  Continuous Infusions:  cefTRIAXone (ROCEPHIN)  IV      Data Reviewed: I have personally reviewed following labs and imaging studies  CBC: Recent Labs  Lab 03/05/19 1302 03/05/19 1416 03/05/19 1720  WBC 7.8  --  7.4  NEUTROABS 6.6  --   --   HGB 14.4 15.6 13.8  HCT 45.3 46.0 43.3  MCV 93.2  --  92.9  PLT 239  --  161   Basic Metabolic Panel: Recent Labs  Lab 03/05/19 1302 03/05/19 1416 03/05/19 1720  NA 145 144  --   K 4.0 3.9  --   CL 104 104  --   CO2 27  --   --   GLUCOSE 209* 202*  --   BUN 17 20  --   CREATININE 1.12 1.00 0.94  CALCIUM 10.0  --   --    GFR: Estimated Creatinine Clearance: 85 mL/min (by C-G  formula based on SCr of 0.94 mg/dL). Liver Function Tests: Recent Labs  Lab 03/05/19 1302  AST 19  ALT 39  ALKPHOS 103  BILITOT 0.6  PROT 7.2  ALBUMIN 4.0   No results for input(s): LIPASE, AMYLASE in the last 168 hours. No results for input(s): AMMONIA in the last 168 hours. Coagulation Profile: Recent Labs  Lab 03/05/19 1302  INR 1.1   Cardiac Enzymes: No results for input(s): CKTOTAL, CKMB, CKMBINDEX, TROPONINI in the last 168 hours. BNP (last 3 results) No results for input(s): PROBNP in the last 8760 hours. HbA1C: Recent Labs    03/05/19 1720 03/06/19 0741  HGBA1C 7.1* 7.2*   CBG: Recent Labs  Lab 03/06/19 0826 03/06/19 1151 03/06/19 1629 03/06/19 2121 03/07/19 0708  GLUCAP 142* 205* 156* 206* 156*   Lipid Profile: Recent Labs    03/06/19 0741  CHOL 119  HDL 36*  LDLCALC 68  TRIG 76  CHOLHDL 3.3   Thyroid Function Tests: No results for input(s): TSH, T4TOTAL, FREET4, T3FREE, THYROIDAB in the last 72 hours. Anemia Panel: No results for input(s): VITAMINB12, FOLATE, FERRITIN, TIBC, IRON, RETICCTPCT in the last 72 hours. Sepsis Labs: No results for input(s): PROCALCITON, LATICACIDVEN in the last 168 hours.  Recent Results (from the past 240 hour(s))  Blood culture (routine x 2)     Status: None (Preliminary result)   Collection Time: 03/05/19  2:00 PM   Specimen: BLOOD LEFT FOREARM  Result Value Ref Range Status   Specimen Description BLOOD LEFT FOREARM  Final   Special Requests   Final    BOTTLES DRAWN AEROBIC AND ANAEROBIC Blood Culture adequate volume   Culture   Final    NO GROWTH 1 DAY Performed at [facility], [place] [address], [place], [place] [address]    Report Status PENDING  Incomplete  Urine culture     Status: Abnormal   Collection Time: 03/05/19  2:36 PM   Specimen: Urine, Random  Result Value Ref Range Status   Specimen Description URINE, RANDOM  Final   Special Requests   Final    NONE Performed at [facility], [place] [address], [place], [place] [address]    Culture >=100,000 COLONIES/mL PROTEUS MIRABILIS (A)  Final   Report Status 03/07/2019 FINAL  Final   Organism ID, Bacteria PROTEUS MIRABILIS (A)  Final      Susceptibility   Proteus mirabilis - MIC*    AMPICILLIN <=2 SENSITIVE Sensitive     CEFAZOLIN <=4 SENSITIVE Sensitive     CEFTRIAXONE <=1 SENSITIVE Sensitive  CIPROFLOXACIN <=0.25 SENSITIVE Sensitive     GENTAMICIN <=1 SENSITIVE Sensitive     IMIPENEM 4 SENSITIVE Sensitive     NITROFURANTOIN 128 RESISTANT Resistant     TRIMETH/SULFA <=20 SENSITIVE Sensitive     AMPICILLIN/SULBACTAM <=2 SENSITIVE Sensitive     PIP/TAZO <=4 SENSITIVE Sensitive     * >=100,000 COLONIES/mL PROTEUS MIRABILIS  SARS CORONAVIRUS 2 (TAT 6-24 HRS) Nasopharyngeal Nasopharyngeal Swab     Status: None   Collection Time: 03/05/19  3:24 PM   Specimen: Nasopharyngeal Swab  Result Value Ref Range Status   SARS Coronavirus 2 NEGATIVE NEGATIVE Final    Comment: (NOTE) SARS-CoV-2 target nucleic acids are NOT DETECTED. The SARS-CoV-2 RNA is generally detectable in upper and lower respiratory specimens during the acute phase of infection. Negative results do not preclude SARS-CoV-2 infection, do not rule out co-infections with other pathogens, and should not be used as the sole basis for treatment or other patient management decisions. Negative results must be combined with clinical observations, patient history, and epidemiological information. The expected result is Negative. Fact Sheet for Patients: HairSlick.no Fact Sheet for Healthcare Providers: quierodirigir.com This test is not yet approved or cleared by the [place] FDA and  has been authorized for detection and/or diagnosis of SARS-CoV-2 by FDA under an Emergency Use Authorization (EUA). This EUA will remain  in effect (meaning this test can be used) for the duration of the COVID-19  declaration under Section 56 4(b)(1) of the Act, 21 U.S.C. section 360bbb-3(b)(1), unless the authorization is terminated or revoked sooner. Performed at [facility], [address], [place], [place] [address]   Blood culture (routine x 2)     Status: None (Preliminary result)   Collection Time: 03/05/19  3:49 PM   Specimen: BLOOD  Result Value Ref Range Status   Specimen Description BLOOD LEFT WRIST  Final   Special Requests   Final    BOTTLES DRAWN AEROBIC ONLY Blood Culture results may not be optimal due to an inadequate volume of blood received in culture bottles   Culture   Final    NO GROWTH < 24 HOURS Performed at [facility], [address], [place], [place] [address]    Report Status PENDING  Incomplete      Radiology Studies: Ct Head Wo Contrast  Result Date: 03/05/2019 CLINICAL DATA:  Altered mental status EXAM: CT HEAD WITHOUT CONTRAST TECHNIQUE: Contiguous axial images were obtained from the base of the skull through the vertex without intravenous contrast. COMPARISON:  2019 FINDINGS: Brain: There is no acute hemorrhage or mass effect. There are large chronic infarcts involving the left PCA and MCA territories. There is new hypoattenuation involving the left thalamus. There is also new hypoattenuation within the medial left parietal lobe. New small focus of hypoattenuation within the right cerebellum likely reflects age-indeterminate infarction. Additional hypoattenuation in the supratentorial matter is nonspecific but likely reflects stable chronic microvascular ischemic changes. There is increased ex vacuo dilatation of the lateral and third ventricles. Vascular: No hyperdense vessel. Atherosclerotic calcification at the skull base. Skull: Calvarium is unremarkable. Sinuses/Orbits: Patchy posterior left ethmoid mucosal thickening. Orbits are unremarkable. Other: Mastoid air cells are clear. IMPRESSION: New extension of left PCA territory infarction to involve  thalamus and more of the parietal lobe. This is likely subacute or chronic although a component of acute ischemia is difficult to exclude. Large chronic left MCA territory infarction. New age-indeterminate small right cerebellar infarct. No acute intracranial hemorrhage. Chronic microvascular ischemic  changes. Electronically Signed   By: Guadlupe SpanishPraneil  Patel M.D.   On: 03/05/2019 14:47   Mr Brain Wo Contrast  Result Date: 03/05/2019 CLINICAL DATA:  Altered level of consciousness, unexplained. Unresponsive episode. EXAM: MRI HEAD WITHOUT CONTRAST TECHNIQUE: Multiplanar, multiecho pulse sequences of the brain and surrounding structures were obtained without intravenous contrast. COMPARISON:  Head CT 03/05/2019 and MRI 03/31/2018 FINDINGS: Brain: Large chronic infarcts are again seen involving the left MCA and left PCA territories with associated chronic hemosiderin deposition, ex vacuo dilatation of the left lateral ventricle, and wallerian degeneration extending into the brainstem. A 1 cm acute infarct is noted in the left corona radiata. Numerous chronic microhemorrhages are again seen in the deep gray nuclei, brainstem, and cerebellum compatible with chronic hypertension. There is a background of severe chronic small vessel ischemic disease in the cerebral white matter including chronic lacunar infarcts in the right corona radiata and deep gray nuclei as well as pons and cerebellum. A chronic lacunar infarct in the right cerebellar hemisphere is new from the prior MRI. There is mild leftward midline shift related to left cerebral hemispheric volume loss. No mass or extra-axial fluid collection is identified. Vascular: Abnormal appearance of the left MCA associated with the chronic infarct. Other major intracranial vascular flow voids are preserved. Skull and upper cervical spine: Unremarkable bone marrow signal. Sinuses/Orbits: Unremarkable orbits. Paranasal sinuses and mastoid air cells are clear. Other: None.  IMPRESSION: 1. 1 cm acute left corona radiata infarct. 2. 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 3. Severe chronic small vessel ischemic disease with numerous chronic lacunar infarcts as above. Electronically Signed   By: Sebastian AcheAllen  Grady M.D.   On: 03/05/2019 18:38   Dg Chest Portable 1 View  Result Date: 03/05/2019 CLINICAL DATA:  68 year old male with vomiting and cough. Query aspiration. EXAM: PORTABLE CHEST 1 VIEW COMPARISON:  Portable chest 04/03/2018. FINDINGS: Portable AP semi upright view at 1315 hours. Stable somewhat low lung volumes. Mediastinal contours remain normal. Visualized tracheal air column is within normal limits. Stable lungs, mild apical scarring but otherwise allowing for portable technique the lungs are clear. Negative visible bowel gas pattern. No acute osseous abnormality identified. Chronic glenohumeral degeneration. IMPRESSION: No acute cardiopulmonary abnormality. Electronically Signed   By: Odessa FlemingH  Hall M.D.   On: 03/05/2019 13:34   [facility] [place] [facility]  Result Date: 03/06/2019 Carotid Arterial Duplex Study Indications:       CVA. Comparison Study:  no prior Performing Technologist: Levada Schillingharlotte Bynum RDMS, RVT  Examination Guidelines: A complete evaluation includes B-mode imaging, spectral Doppler, color Doppler, and power Doppler as needed of all accessible portions of each vessel. Bilateral testing is considered an integral part of a complete examination. Limited examinations for reoccurring indications may be performed as noted.  Right Carotid Findings: +----------+--------+--------+--------+------------------+--------+             PSV cm/s EDV cm/s Stenosis Plaque Description Comments  +----------+--------+--------+--------+------------------+--------+  CCA Prox   94       18                heterogenous                 +----------+--------+--------+--------+------------------+--------+  CCA Distal 115      22                                              +----------+--------+--------+--------+------------------+--------+  ICA Prox   78       26       1-39%    heterogenous                 +----------+--------+--------+--------+------------------+--------+  ICA Distal 86       30                                             +----------+--------+--------+--------+------------------+--------+ +----------+--------+-------+----------------+-------------------+             PSV cm/s EDV cms Describe         Arm Pressure (mmHG)  +----------+--------+-------+----------------+-------------------+  Subclavian 130              Multiphasic, WNL                      +----------+--------+-------+----------------+-------------------+ +---------+--------+--+--------+--+----------------------------+  Vertebral PSV cm/s 64 EDV cm/s 20 Antegrade and High resistant  +---------+--------+--+--------+--+----------------------------+  Left Carotid Findings: +----------+--------+--------+--------+------------------+-----------------+             PSV cm/s EDV cm/s Stenosis Plaque Description Comments           +----------+--------+--------+--------+------------------+-----------------+  CCA Prox   85       9                                                       +----------+--------+--------+--------+------------------+-----------------+  CCA Distal 73       11                homogeneous                           +----------+--------+--------+--------+------------------+-----------------+  ICA Prox   30       7        1-39%    heterogenous       atypical waveform  +----------+--------+--------+--------+------------------+-----------------+  ICA Mid    71       14                                   atypical waveform  +----------+--------+--------+--------+------------------+-----------------+  ICA Distal 87       17                                   atypical waveform  +----------+--------+--------+--------+------------------+-----------------+  ECA        349      31       >50%     heterogenous                           +----------+--------+--------+--------+------------------+-----------------+ +----------+--------+--------+----------------+-------------------+             PSV cm/s EDV cm/s Describe         Arm Pressure (mmHG)  +----------+--------+--------+----------------+-------------------+  Subclavian 150               Multiphasic, WNL                      +----------+--------+--------+----------------+-------------------+ +---------+--------+--+--------+--+---------+  Vertebral PSV cm/s 53 EDV cm/s 16 Antegrade  +---------+--------+--+--------+--+---------+  Summary: Right Carotid: Velocities in the right ICA are consistent with a 1-39% stenosis. Left Carotid: Velocities in the left ICA are consistent with a 1-39% stenosis.               Dampened, high resistant ICA waveforms suggestive of a more distal               obstruction.                Left ECA velocities consistent with >50% stenosis.  *See table(s) above for measurements and observations.  Electronically signed by Gretta Began MD on 03/06/2019 at 6:25:38 PM.    Final       LOS: 2 days   Time spent: More than 50% of that time was spent in counseling and/or coordination of care.  Lanae Boast, MD [facility]  03/07/2019, 8:58 AM

## 2019-03-07 NOTE — Progress Notes (Signed)
  Speech Language Pathology Treatment: Dysphagia  Patient Details Name: Douglas [name] MRN: [id number] DOB: [birth year] Today's Date: 03/07/2019 Time: 9169-4503 SLP Time Calculation (min) (ACUTE ONLY): 22 min  Assessment / Plan / Recommendation Clinical Impression  Pt was seen for skilled ST targeting dysphagia.  Pt was encountered awake/alert with meal tray in front of him and daughter present at bedside.  Daughter reported that pt received home health ST following discharge from [facility] in 2019 and that he had been upgraded to thin liquid around March of this year.  Pt was observed with trials of thin liquid, nectar-thick liquid, and Dysphagia 3 (soft) solids from meal tray.  Pt was observed to be impulsive, taking multiple large bites/sips in a row; however he exhibited timely mastication of soft solids and no oral residue was observed.  Additionally no overt s/sx of aspiration were observed with any trials including thin liquid via tsp and small cup sip.  Due to hx of silent aspiration of thin liquid, recommend MBS to further evaluate swallow function and to help determine the safest/least restrictive diet.  Recommend continuation of Dysphagia 3 (soft) solids and nectar-thick liquid at this time with full supervision to cue for the following compensatory strategies: 1) Small bites/sips 2) Slow rate of intake 3) Sit upright 90 degrees 4) Check for clearance of oral cavity after po intake.  Pt and daughter were educated regarding all recommendations and daughter verbalized understanding.     HPI HPI: Douglas [name] is a 68 y.o. 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 MRI showing 1 cm acute L corona radiata, chronic L MCA & PCA.  Pt was on CIR in December 2019 following his left MCA and ACA CVA and was on a dys2/nectar diet upon dc and was working on  communicating basic needs via multimodal communication.  Pt failed [facility] as she stopped drinking water.      SLP Plan  MBS;Continue with current plan of care       Recommendations  Diet recommendations: Dysphagia 3 (mechanical soft);Nectar-thick liquid Liquids provided via: Cup Medication Administration: Whole meds with puree Supervision: Intermittent supervision to cue for compensatory strategies;Patient able to self feed Compensations: Slow rate;Small sips/bites Postural Changes and/or Swallow Maneuvers: Seated upright 90 degrees                Oral Care Recommendations: Staff/trained caregiver to provide oral care;Oral care QID Follow up Recommendations: Other (comment);24 hour supervision/assistance(TBD) SLP Visit Diagnosis: Dysphagia, unspecified (R13.10) Plan: MBS;Continue with current plan of care       Douglas [name] M.S., [place] Office: [phone]               [place] 03/07/2019, 1:09 PM

## 2019-03-07 NOTE — Progress Notes (Signed)
STROKE TEAM PROGRESS NOTE   INTERVAL HISTORY Pt lying in bed. Global aphasia and right hemiplegia. Not able to provide any history. As per chart, pt seemed to have seizure activity. MRI showed left MCA territory small infarct at the outskirt of previous stroke. CTA continued to show chronic left M1 and left PCA cut off.   Vitals:   03/06/19 2303 03/07/19 0414 03/07/19 0734 03/07/19 1118  BP: (!) 155/89 (!) 151/94 (!) 155/95 (!) 156/83  Pulse: 89 85 80 83  Resp: 17 17 15 14   Temp: 99 F (37.2 C) (!) 97.4 F (36.3 C) 98.5 F (36.9 C) 98.7 F (37.1 C)  TempSrc: Oral Oral Oral Oral  SpO2: 98% 100% 99% 99%  Weight:      Height:        CBC:  Recent Labs  Lab 03/05/19 1302 03/05/19 1416 03/05/19 1720  WBC 7.8  --  7.4  NEUTROABS 6.6  --   --   HGB 14.4 15.6 13.8  HCT 45.3 46.0 43.3  MCV 93.2  --  92.9  PLT 239  --  231    Basic Metabolic Panel:  Recent Labs  Lab 03/05/19 1302 03/05/19 1416 03/05/19 1720  NA 145 144  --   K 4.0 3.9  --   CL 104 104  --   CO2 27  --   --   GLUCOSE 209* 202*  --   BUN 17 20  --   CREATININE 1.12 1.00 0.94  CALCIUM 10.0  --   --    Lipid Panel:     Component Value Date/Time   CHOL 119 03/06/2019 0741   TRIG 76 03/06/2019 0741   HDL 36 (L) 03/06/2019 0741   CHOLHDL 3.3 03/06/2019 0741   VLDL 15 03/06/2019 0741   LDLCALC 68 03/06/2019 0741   HgbA1c:  Lab Results  Component Value Date   HGBA1C 7.2 (H) 03/06/2019   Urine Drug Screen:     Component Value Date/Time   LABOPIA NONE DETECTED 03/05/2019 1436   COCAINSCRNUR NONE DETECTED 03/05/2019 1436   LABBENZ NONE DETECTED 03/05/2019 1436   AMPHETMU NONE DETECTED 03/05/2019 1436   THCU NONE DETECTED 03/05/2019 1436   LABBARB NONE DETECTED 03/05/2019 1436    Alcohol Level     Component Value Date/Time   ETH <10 03/05/2019 1302    IMAGING Ct Angio Head W Or Wo Contrast  Result Date: 03/07/2019 CLINICAL DATA:  Stroke, follow-up EXAM: CT ANGIOGRAPHY HEAD AND NECK  TECHNIQUE: Multidetector CT imaging of the head and neck was performed using the standard protocol during bolus administration of intravenous contrast. Multiplanar CT image reconstructions and MIPs were obtained to evaluate the vascular anatomy. Carotid stenosis measurements (when applicable) are obtained utilizing NASCET criteria, using the distal internal carotid diameter as the denominator. CONTRAST:  75mL OMNIPAQUE IOHEXOL 350 MG/ML SOLN COMPARISON:  None. FINDINGS: CT HEAD FINDINGS Brain: No acute intracranial hemorrhage. Small left corona radiata infarcts on recent MRI are not seen. Large chronic infarcts of the left MCA and PCA territories are again identified. There is a chronic small vessel infarct of the right cerebellar hemisphere. Additional patchy and confluent areas of hypoattenuation in the supratentorial white matter likely reflect chronic microvascular ischemic changes. There is ex vacuo dilatation of the left lateral ventricle. Additional prominence of the ventricles and sulci reflects generalized parenchymal volume loss. Vascular: Atherosclerotic calcification at the skull base. Skull: Unremarkable. Sinuses: Minor mucosal thickening. Orbits: Orbits are unremarkable. Review of the MIP images confirms  the above findings CTA NECK FINDINGS Aortic arch: Great vessel origins are patent. Right carotid system: Common, internal, and external carotid arteries are patent. Mild multifocal calcified plaque along the common carotid without stenosis. Eccentric noncalcified plaque ICA origin of the left 50% stenosis. Left carotid system: Common, internal, and external carotid arteries are patent. Calcified and noncalcified plaque at the bifurcation and along the proximal internal carotid causing less than 50% stenosis. Diminished caliber of the remainder of the cervical left ICA relative to the right is again noted without focal stenosis. Vertebral arteries: Patent. Left vertebral artery is dominant. No  significant stenosis. Skeleton: Stable multilevel degenerative changes the cervical spine. Other neck: No neck mass or adenopathy. Upper chest: No apical lung mass. Review of the MIP images confirms the above findings CTA HEAD FINDINGS Anterior circulation: Intracranial internal carotid arteries are patent with multifocal calcified plaque resulting in at least moderate stenosis. Anterior cerebral arteries are patent with moderate stenosis of the A1 segments. 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 Stable diffuse moderate stenosis of the right M1 MCA. Posterior circulation: Stable moderate-to-marked stenoses of the proximal intracranial right vertebral artery, which becomes diminutive after the origin of the PICA. Stable moderate stenosis of the intracranial left vertebral artery. Basilar artery is diffusely irregular and decreased in caliber. The right posterior cerebral artery is patent with diffuse irregularity and moderate stenosis of the P2 segment. The left posterior cerebral artery is occluded proximally. Venous sinuses: Not well evaluated. Anatomic variants: None significant Review of the MIP images confirms the above findings IMPRESSION: No acute intracranial findings. Small acute infarct on MRI is not well seen. No large vessel occlusion. No hemodynamically significant stenosis in the neck. Multifocal intracranial atherosclerosis is stable from the prior study. Electronically Signed   By: Guadlupe Spanish M.D.   On: 03/07/2019 11:43   Ct Head Wo Contrast  Result Date: 03/05/2019 CLINICAL DATA:  Altered mental status EXAM: CT HEAD WITHOUT CONTRAST TECHNIQUE: Contiguous axial images were obtained from the base of the skull through the vertex without intravenous contrast. COMPARISON:  2019 FINDINGS: Brain: There is no acute hemorrhage or mass effect. There are large chronic infarcts involving the left PCA and MCA territories. There is new hypoattenuation involving the left thalamus. There is also new  hypoattenuation within the medial left parietal lobe. New small focus of hypoattenuation within the right cerebellum likely reflects age-indeterminate infarction. Additional hypoattenuation in the supratentorial matter is nonspecific but likely reflects stable chronic microvascular ischemic changes. There is increased ex vacuo dilatation of the lateral and third ventricles. Vascular: No hyperdense vessel. Atherosclerotic calcification at the skull base. Skull: Calvarium is unremarkable. Sinuses/Orbits: Patchy posterior left ethmoid mucosal thickening. Orbits are unremarkable. Other: Mastoid air cells are clear. IMPRESSION: New extension of left PCA territory infarction to involve thalamus and more of the parietal lobe. This is likely subacute or chronic although a component of acute ischemia is difficult to exclude. Large chronic left MCA territory infarction. New age-indeterminate small right cerebellar infarct. No acute intracranial hemorrhage. Chronic microvascular ischemic changes. Electronically Signed   By: Guadlupe Spanish M.D.   On: 03/05/2019 14:47   Ct Angio Neck W Or Wo Contrast  Result Date: 03/07/2019 CLINICAL DATA:  Stroke, follow-up EXAM: CT ANGIOGRAPHY HEAD AND NECK TECHNIQUE: Multidetector CT imaging of the head and neck was performed using the standard protocol during bolus administration of intravenous contrast. Multiplanar CT image reconstructions and MIPs were obtained to evaluate the vascular anatomy. Carotid stenosis measurements (when  applicable) are obtained utilizing NASCET criteria, using the distal internal carotid diameter as the denominator. CONTRAST:  55mL OMNIPAQUE IOHEXOL 350 MG/ML SOLN COMPARISON:  None. FINDINGS: CT HEAD FINDINGS Brain: No acute intracranial hemorrhage. Small left corona radiata infarcts on recent MRI are not seen. Large chronic infarcts of the left MCA and PCA territories are again identified. There is a chronic small vessel infarct of the right cerebellar  hemisphere. Additional patchy and confluent areas of hypoattenuation in the supratentorial white matter likely reflect chronic microvascular ischemic changes. There is ex vacuo dilatation of the left lateral ventricle. Additional prominence of the ventricles and sulci reflects generalized parenchymal volume loss. Vascular: Atherosclerotic calcification at the skull base. Skull: Unremarkable. Sinuses: Minor mucosal thickening. Orbits: Orbits are unremarkable. Review of the MIP images confirms the above findings CTA NECK FINDINGS Aortic arch: Great vessel origins are patent. Right carotid system: Common, internal, and external carotid arteries are patent. Mild multifocal calcified plaque along the common carotid without stenosis. Eccentric noncalcified plaque ICA origin of the left 50% stenosis. Left carotid system: Common, internal, and external carotid arteries are patent. Calcified and noncalcified plaque at the bifurcation and along the proximal internal carotid causing less than 50% stenosis. Diminished caliber of the remainder of the cervical left ICA relative to the right is again noted without focal stenosis. Vertebral arteries: Patent. Left vertebral artery is dominant. No significant stenosis. Skeleton: Stable multilevel degenerative changes the cervical spine. Other neck: No neck mass or adenopathy. Upper chest: No apical lung mass. Review of the MIP images confirms the above findings CTA HEAD FINDINGS Anterior circulation: Intracranial internal carotid arteries are patent with multifocal calcified plaque resulting in at least moderate stenosis. Anterior cerebral arteries are patent with moderate stenosis of the A1 segments. 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 Stable diffuse moderate stenosis of the right M1 MCA. Posterior circulation: Stable moderate-to-marked stenoses of the proximal intracranial right vertebral artery, which becomes diminutive after the origin of the PICA. Stable moderate  stenosis of the intracranial left vertebral artery. Basilar artery is diffusely irregular and decreased in caliber. The right posterior cerebral artery is patent with diffuse irregularity and moderate stenosis of the P2 segment. The left posterior cerebral artery is occluded proximally. Venous sinuses: Not well evaluated. Anatomic variants: None significant Review of the MIP images confirms the above findings IMPRESSION: No acute intracranial findings. Small acute infarct on MRI is not well seen. No large vessel occlusion. No hemodynamically significant stenosis in the neck. Multifocal intracranial atherosclerosis is stable from the prior study. Electronically Signed   By: Macy Mis M.D.   On: 03/07/2019 11:43   Mr Brain Wo Contrast  Result Date: 03/05/2019 CLINICAL DATA:  Altered level of consciousness, unexplained. Unresponsive episode. EXAM: MRI HEAD WITHOUT CONTRAST TECHNIQUE: Multiplanar, multiecho pulse sequences of the brain and surrounding structures were obtained without intravenous contrast. COMPARISON:  Head CT 03/05/2019 and MRI 03/31/2018 FINDINGS: Brain: Large chronic infarcts are again seen involving the left MCA and left PCA territories with associated chronic hemosiderin deposition, ex vacuo dilatation of the left lateral ventricle, and wallerian degeneration extending into the brainstem. A 1 cm acute infarct is noted in the left corona radiata. Numerous chronic microhemorrhages are again seen in the deep gray nuclei, brainstem, and cerebellum compatible with chronic hypertension. There is a background of severe chronic small vessel ischemic disease in the cerebral white matter including chronic lacunar infarcts in the right corona radiata and deep gray nuclei as well as pons and  cerebellum. A chronic lacunar infarct in the right cerebellar hemisphere is new from the prior MRI. There is mild leftward midline shift related to left cerebral hemispheric volume loss. No mass or extra-axial  fluid collection is identified. Vascular: Abnormal appearance of the left MCA associated with the chronic infarct. Other major intracranial vascular flow voids are preserved. Skull and upper cervical spine: Unremarkable bone marrow signal. Sinuses/Orbits: Unremarkable orbits. Paranasal sinuses and mastoid air cells are clear. Other: None. IMPRESSION: 1. 1 cm acute left corona radiata infarct. 2. 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 3. Severe chronic small vessel ischemic disease with numerous chronic lacunar infarcts as above. Electronically Signed   By: Sebastian Ache M.D.   On: 03/05/2019 18:38   Dg Chest Portable 1 View  Result Date: 03/05/2019 CLINICAL DATA:  68 year old male with vomiting and cough. Query aspiration. EXAM: PORTABLE CHEST 1 VIEW COMPARISON:  Portable chest 04/03/2018. FINDINGS: Portable AP semi upright view at 1315 hours. Stable somewhat low lung volumes. Mediastinal contours remain normal. Visualized tracheal air column is within normal limits. Stable lungs, mild apical scarring but otherwise allowing for portable technique the lungs are clear. Negative visible bowel gas pattern. No acute osseous abnormality identified. Chronic glenohumeral degeneration. IMPRESSION: No acute cardiopulmonary abnormality. Electronically Signed   By: Odessa Fleming M.D.   On: 03/05/2019 13:34   Vas US Carotid (at [facility])  Result Date: 03/06/2019 Carotid Arterial Duplex Study Indications:       CVA. Comparison Study:  no prior Performing Technologist: Levada Schilling RDMS, RVT  Examination Guidelines: A complete evaluation includes B-mode imaging, spectral Doppler, color Doppler, and power Doppler as needed of all accessible portions of each vessel. Bilateral testing is considered an integral part of a complete examination. Limited examinations for reoccurring indications may be performed as noted.  Right Carotid Findings: +----------+--------+--------+--------+------------------+--------+            PSV cm/sEDV cm/sStenosisPlaque DescriptionComments +----------+--------+--------+--------+------------------+--------+ CCA Prox  94      18              heterogenous               +----------+--------+--------+--------+------------------+--------+ CCA Distal115     22                                         +----------+--------+--------+--------+------------------+--------+ ICA Prox  78      26      1-39%   heterogenous               +----------+--------+--------+--------+------------------+--------+ ICA Distal86      30                                         +----------+--------+--------+--------+------------------+--------+ +----------+--------+-------+----------------+-------------------+           PSV cm/sEDV cmsDescribe        Arm Pressure (mmHG) +----------+--------+-------+----------------+-------------------+ Subclavian130            Multiphasic, WNL                    +----------+--------+-------+----------------+-------------------+ +---------+--------+--+--------+--+----------------------------+ VertebralPSV cm/s64EDV cm/s20Antegrade and High resistant +---------+--------+--+--------+--+----------------------------+  Left Carotid Findings: +----------+--------+--------+--------+------------------+-----------------+           PSV cm/sEDV cm/sStenosisPlaque DescriptionComments          +----------+--------+--------+--------+------------------+-----------------+  CCA Prox  85      9                                                   +----------+--------+--------+--------+------------------+-----------------+ CCA Distal73      11              homogeneous                         +----------+--------+--------+--------+------------------+-----------------+ ICA Prox  30      7       1-39%   heterogenous      atypical waveform +----------+--------+--------+--------+------------------+-----------------+ ICA Mid   71      14                                 atypical waveform +----------+--------+--------+--------+------------------+-----------------+ ICA Distal87      17                                atypical waveform +----------+--------+--------+--------+------------------+-----------------+ ECA       349     31      >50%    heterogenous                        +----------+--------+--------+--------+------------------+-----------------+ +----------+--------+--------+----------------+-------------------+           PSV cm/sEDV cm/sDescribe        Arm Pressure (mmHG) +----------+--------+--------+----------------+-------------------+ Subclavian150             Multiphasic, WNL                    +----------+--------+--------+----------------+-------------------+ +---------+--------+--+--------+--+---------+ VertebralPSV cm/s53EDV cm/s16Antegrade +---------+--------+--+--------+--+---------+  Summary: Right Carotid: Velocities in the right ICA are consistent with a 1-39% stenosis. Left Carotid: Velocities in the left ICA are consistent with a 1-39% stenosis.               Dampened, high resistant ICA waveforms suggestive of a more distal               obstruction.                Left ECA velocities consistent with >50% stenosis.  *See table(s) above for measurements and observations.  Electronically signed by Gretta Began MD on 03/06/2019 at 6:25:38 PM.    Final     PHYSICAL EXAM  Temp:  [97.4 F (36.3 C)-99 F (37.2 C)] 98.7 F (37.1 C) (11/05 1118) Pulse Rate:  [80-89] 83 (11/05 1118) Resp:  [14-17] 14 (11/05 1118) BP: (151-171)/(83-95) 156/83 (11/05 1118) SpO2:  [98 %-100 %] 99 % (11/05 1118)  General - Well nourished, well developed, in no apparent distress.  Ophthalmologic - fundi not visualized due to noncooperation.  Cardiovascular - Regular rhythm and rate.  Neuro - awake, alert, global aphasia, not following commands and only say "yes" to everything, not able to name or repeat. Blinking to  visual threat on the left, but not on the right. PERRL, EOMI, tracking bilaterally. Right facial droop, tongue protrusion not cooperative. LUE 5/5 and LLE at least 4/5. RUE spastic plegia, RLE 2/5 proximal and 0/5 distal. Sensation, coordination and gait not tested.   ASSESSMENT/PLAN Mr. KYIAN OBST  Montez Hageman is a 68 y.o. 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   Seizure   Presentation more consistent with seizure episode (AMS, staring off, urinary incontinence, lasting one min)  Old left MCA and PCA infarct could be seizure focus  EEG no seizure, left cortical dysfunction  On lamictal 100mg  bid PTA  Continue lamictal   Will start keppra 500 bid  Stroke: New small L CR infarct in setting of old large L MCA and L PCA infarcts with chronic left M1 and PCA occlusion - ?? Transient hypotension during seizure ??  CT head extension of prior L PCA infarct to involve thalamus and parietal lobe.old large L MCA infarct. New small R cerebellar infarct, age indeterminate. No hemorrhage. Small vessel disease.   MRI  Small L corona radiata infarct. Large chronic L MCA and L PCA infarcts. Multiple chronic lacunes.  CTA head & neck no ELVO. 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  Carotid Doppler  L ICA dampened waveform suggests distal obstruction  2D Echo EF > 75%. No source of embolus   LDL 68  HgbA1c 7.2  Lovenox 40 mg sq daily for VTE prophylaxis  aspirin 325 mg daily and clopidogrel 75 mg daily prior to admission, now on aspirin 325 mg daily and clopidogrel 75 mg daily. Continue on discharge.   Therapy recommendations:  SNF  Disposition:  pending   History stroke  Will consider loop recorder this time since patient is living with daughter in [place] now.  03/30/2018 -  Large left MCA territory infarct -  embolic pattern - unknown source, cardioembolic vs. Diffuse severe intracranial athero.  CTA head and neck showed left M1 and left P1 cutoff, right M1, P2 and left VA high-grade stenosis.  TTE negative, no PFO, DVT negative.  LDL 60 and A1c 7.3.  Treated in ICU w/ 3%.  On discharge DAPT x 3 mos given significant intracranial atherosclerosis. Loop recorder suggested but not placed as pt from [facility] with plans to f/u there.  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 CT w/ old L PCA territory infarct. Residual R HP, hemianopia. Could walk w/ walker but needed help with ADLs at baseline  Hypertension  Stable . Permissive hypertension (OK if < 220/120) but gradually normalize in 5-7 days . Long-term BP goal 130-150 given intracranial vascular occlusion . Avoid low BP  Hyperlipidemia  Home meds:  Lipitor 20  Now on lipitor 40  LDL 68, goal < 70  Continue statin at discharge  Diabetes type II Uncontrolled  HgbA1c 7.2, goal < 7.0  CBGs  SSI  Hyperglycemia  Close PCP follow-up as outpatient  Other Stroke Risk Factors  Advanced age  Hx ETOH use  Other Active Problems  UTI w/ proteus -on Rocephin  BPH  Hospital day # 2  2014, MD PhD Stroke Neurology 03/07/2019 6:38 PM   To contact Stroke Continuity provider, please refer to 13/08/2018. After hours, contact General Neurology

## 2019-03-07 NOTE — TOC Initial Note (Signed)
Transition of Care [facility]) - Initial/Assessment Note    Patient Details  Name: Douglas [name] MRN: [id number] Date of Birth: January 10, 1951  Transition of [facility]) CM/SW Contact:    Pollie Friar, RN Phone Number: 03/07/2019, 2:04 PM  Clinical Narrative:                 CM met with the patient and his daughter at the bedside. They are agreeable to SNF rehab in the [facility] area. CM completed FL2 and faxed him out to the SNFs. CM provided daughter with list of facilities in the area.  TOC following.   Expected Discharge Plan: Skilled Nursing Facility Barriers to Discharge: Continued Medical Work up   Patient Goals and CMS Choice   CMS Medicare.gov Compare Post Acute Care list provided to:: Patient Represenative (must comment) Choice offered to / list presented to : Adult Children(daughter)  Expected Discharge Plan and Services Expected Discharge Plan: [place] In-house Referral: Clinical Social Work Discharge Planning Services: CM Consult Post Acute Care Choice: [place] arrangements for the past 2 months: [place]                                      Prior Living Arrangements/Services Living arrangements for the past 2 months: Single Family Home Lives with:: Adult Children Patient language and need for interpreter reviewed:: Yes(no needs)        Need for Family Participation in Patient Care: Yes (Comment) Care giver support system in place?: No (comment)   Criminal Activity/Legal Involvement Pertinent to Current Situation/Hospitalization: No - Comment as needed  Activities of Daily Living Home Assistive Devices/Equipment: Blood pressure cuff, CBG Meter, Shower chair with back, Environmental consultant (specify type), Wheelchair ADL Screening (condition at time of admission) Patient's cognitive ability adequate to safely complete daily activities?: Yes(pere daughter) Is the patient deaf or have difficulty hearing?:  No Does the patient have difficulty seeing, even when wearing glasses/contacts?: Yes Does the patient have difficulty concentrating, remembering, or making decisions?: Yes Patient able to express need for assistance with ADLs?: No Does the patient have difficulty dressing or bathing?: Yes Independently performs ADLs?: No Communication: Needs assistance Is this a change from baseline?: Pre-admission baseline Dressing (OT): Needs assistance Is this a change from baseline?: Pre-admission baseline Grooming: Needs assistance Is this a change from baseline?: Pre-admission baseline Feeding: Needs assistance Is this a change from baseline?: Pre-admission baseline Bathing: Independent, Needs assistance Is this a change from baseline?: Pre-admission baseline Toileting: Independent, Needs assistance Is this a change from baseline?: Pre-admission baseline In/Out Bed: Needs assistance, Dependent Is this a change from baseline?: Pre-admission baseline Walks in Home: Dependent Is this a change from baseline?: Pre-admission baseline Does the patient have difficulty walking or climbing stairs?: Yes Weakness of Legs: Both Weakness of Arms/Hands: Both  Permission Sought/Granted                  Emotional Assessment Appearance:: Appears stated age     Orientation: : Oriented to Self, Oriented to Place, Oriented to Situation   Psych Involvement: No (comment)  Admission diagnosis:  Urinary tract infection with hematuria, site unspecified [N39.0, R31.9] Altered mental status, unspecified altered mental status type [R41.82] Patient Active Problem List   Diagnosis Date Noted  . Ischemic stroke ([place]) 03/05/2019  . UTI (urinary tract infection) 03/05/2019  . Acute blood loss anemia   .  Labile blood pressure   . 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 Labile blood glucose   . Global aphasia   . Cerebral edema ([place]) 04/04/2018  . Renal  insufficiency 04/04/2018  . 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 Acute lower UTI   . Type 2 diabetes mellitus with peripheral neuropathy (HCC)   . Acute embolic stroke ([place])   . Diabetes 1.5, managed as type 2 ([place])   . Essential hypertension   . Dyslipidemia   . History of CVA with residual deficit   . Dysphagia, post-stroke    PCP:  Patient, No Pcp Per Pharmacy:   [id number] [place] [address] [id number]: [phone]: [phone]    Social Determinants of Health (SDOH) Interventions    Readmission Risk Interventions No flowsheet data found.

## 2019-03-07 NOTE — Progress Notes (Signed)
EEG complete - results pending 

## 2019-03-07 NOTE — NC FL2 (Signed)
[place] LEVEL OF CARE SCREENING TOOL     IDENTIFICATION  Patient Name: Douglas [name] Birthdate: 12-27-1950 Sex: male Admission Date (Current Location): 03/05/2019  [facility] and [place] Number:  Herbalist and Address:  [facility], [place] [address] [place], [place] [address]      Provider Number: [id number]  Attending Physician Name and Address:  Antonieta Pert, MD  Relative Name and Phone Number:       Current Level of Care: Hospital Recommended Level of Care: [place] Prior Approval Number:    Date Approved/Denied:   PASRR Number: [id number]  Discharge Plan: SNF    Current Diagnoses: Patient Active Problem List   Diagnosis Date Noted  . Ischemic stroke ([place]) 03/05/2019  . UTI (urinary tract infection) 03/05/2019  . Acute blood loss anemia   . Labile blood pressure   . 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 Labile blood glucose   . Global aphasia   . Cerebral edema ([place]) 04/04/2018  . Renal insufficiency 04/04/2018  . 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 Acute lower UTI   . Type 2 diabetes mellitus with peripheral neuropathy (HCC)   . Acute embolic stroke ([place])   . Diabetes 1.5, managed as type 2 ([place])   . Essential hypertension   . Dyslipidemia   . History of CVA with residual deficit   . Dysphagia, post-stroke     Orientation RESPIRATION BLADDER Height & Weight     Self, Situation, Place  Normal Continent Weight: 82.6 kg Height:  6\' 1"  (185.4 cm)  BEHAVIORAL SYMPTOMS/MOOD NEUROLOGICAL BOWEL NUTRITION STATUS      Continent Diet(dysphagia 3 with nectar thick liquids)  AMBULATORY STATUS COMMUNICATION OF NEEDS Skin   Extensive Assist Verbally Normal                       Personal Care Assistance Level of Assistance  Bathing, Feeding, Dressing Bathing Assistance: Maximum assistance Feeding assistance: Limited  assistance Dressing Assistance: Maximum assistance     Functional Limitations Info  Sight, Hearing, Speech Sight Info: Adequate Hearing Info: Adequate Speech Info: Impaired(expressive aphasia)    SPECIAL CARE FACTORS FREQUENCY  OT (By licensed OT), PT (By licensed PT), Speech therapy     PT Frequency: 5x/wk OT Frequency: 5x/wk     Speech Therapy Frequency: 5x/wk      Contractures Contractures Info: Present(RUE)    Additional Factors Info  Code Status, Psychotropic, Allergies, Insulin Sliding Scale Code Status Info: Full Allergies Info: ciprofloxacin, avocados Psychotropic Info: Baclofen 20 mg Twice a day/ Lamictal 100 mg Twice a day Insulin Sliding Scale Info: Novolog 0-15 units SQ three times a day/ Novolog 0-5 units SQ at bedtime       Current Medications (03/07/2019):  This is the current hospital active medication list Current Facility-Administered Medications  Medication Dose Route Frequency Provider Last Rate Last Dose  .  stroke: mapping our early stages of recovery book   Does not apply Once Pahwani, Rinka R, MD      . acetaminophen (TYLENOL) tablet 650 mg  650 mg Oral Q4H PRN Pahwani, Rinka R, MD   650 mg at 03/06/19 1050   Or  . acetaminophen (TYLENOL) 160 MG/5ML solution 650 mg  650 mg Per Tube Q4H PRN Pahwani, Rinka R, MD       Or  . acetaminophen (TYLENOL) suppository 650  mg  650 mg Rectal Q4H PRN Pahwani, Rinka R, MD      . aspirin EC tablet 325 mg  325 mg Oral Daily Kc, Ramesh, MD   325 mg at 03/07/19 0933  . atorvastatin (LIPITOR) tablet 80 mg  80 mg Oral q1800 Pahwani, Rinka R, MD   80 mg at 03/06/19 1725  . baclofen (LIORESAL) tablet 20 mg  20 mg Oral BID Pahwani, Rinka R, MD   20 mg at 03/07/19 0933  . cefTRIAXone (ROCEPHIN) 1 g in sodium chloride 0.9 % 100 mL IVPB  1 g Intravenous Q24H Pahwani, Rinka R, MD      . clopidogrel (PLAVIX) tablet 75 mg  75 mg Oral Daily Pahwani, Rinka R, MD   75 mg at 03/07/19 0933  . enoxaparin (LOVENOX) injection 40 mg  40  mg Subcutaneous Q24H Pahwani, Rinka R, MD   40 mg at 03/06/19 1725  . insulin aspart (novoLOG) injection 0-15 Units  0-15 Units Subcutaneous TID WC Pahwani, Rinka R, MD   3 Units at 03/07/19 1221  . insulin aspart (novoLOG) injection 0-5 Units  0-5 Units Subcutaneous QHS Pahwani, Kasandra Knudsen, MD   2 Units at 03/06/19 2138  . lamoTRIgine (LAMICTAL) tablet 100 mg  100 mg Oral BID Pahwani, Rinka R, MD   100 mg at 03/07/19 0933  . pantoprazole (PROTONIX) EC tablet 40 mg  40 mg Oral Daily Pahwani, Rinka R, MD   40 mg at 03/07/19 0933  . tamsulosin (FLOMAX) capsule 0.4 mg  0.4 mg Oral QPC supper Pahwani, Rinka R, MD   0.4 mg at 03/06/19 1725     Discharge Medications: Please see discharge summary for a list of discharge medications.  Relevant Imaging Results:  Relevant Lab Results:   Additional Information SSN: [id number]  Kermit Balo, RN

## 2019-03-08 ENCOUNTER — Inpatient Hospital Stay (HOSPITAL_COMMUNITY): Payer: Medicare Other

## 2019-03-08 ENCOUNTER — Encounter (HOSPITAL_COMMUNITY)
Admission: EM | Disposition: A | Discharge status: Skilled Nursing Facility | Payer: Self-pay | Source: Home / Self Care | Attending: Family Medicine

## 2019-03-08 DIAGNOSIS — I639 Cerebral infarction, unspecified: Principal | ICD-10-CM

## 2019-03-08 DIAGNOSIS — Z9889 Other specified postprocedural states: Secondary | ICD-10-CM

## 2019-03-08 HISTORY — PX: LOOP RECORDER INSERTION: EP1214

## 2019-03-08 LAB — CBC
HCT: 40 % (ref 39.0–52.0)
Hemoglobin: 13 g/dL (ref 13.0–17.0)
MCH: 30 pg (ref 26.0–34.0)
MCHC: 32.5 g/dL (ref 30.0–36.0)
MCV: 92.4 fL (ref 80.0–100.0)
Platelets: 219 10*3/uL (ref 150–400)
RBC: 4.33 MIL/uL (ref 4.22–5.81)
RDW: 13 % (ref 11.5–15.5)
WBC: 5.9 10*3/uL (ref 4.0–10.5)
nRBC: 0 % (ref 0.0–0.2)

## 2019-03-08 LAB — GLUCOSE, CAPILLARY
Glucose-Capillary: 182 mg/dL — ABNORMAL HIGH (ref 70–99)
Glucose-Capillary: 277 mg/dL — ABNORMAL HIGH (ref 70–99)

## 2019-03-08 LAB — BASIC METABOLIC PANEL
Anion gap: 7 (ref 5–15)
BUN: 16 mg/dL (ref 8–23)
CO2: 28 mmol/L (ref 22–32)
Calcium: 9.2 mg/dL (ref 8.9–10.3)
Chloride: 105 mmol/L (ref 98–111)
Creatinine, Ser: 1.27 mg/dL — ABNORMAL HIGH (ref 0.61–1.24)
GFR calc Af Amer: >60 mL/min (ref >60)
GFR calc non Af Amer: 58 mL/min — ABNORMAL LOW (ref >60)
Glucose, Bld: 199 mg/dL — ABNORMAL HIGH (ref 70–99)
Potassium: 3.9 mmol/L (ref 3.5–5.1)
Sodium: 140 mmol/L (ref 135–145)

## 2019-03-08 SURGERY — LOOP RECORDER INSERTION

## 2019-03-08 MED ORDER — AMLODIPINE BESYLATE 5 MG PO TABS
5.0000 mg | ORAL_TABLET | Freq: Every day | ORAL | Status: DC
  Administered 2019-03-08 – 2019-03-09 (×2): 5 mg via ORAL
  Filled 2019-03-08 (×2): qty 1

## 2019-03-08 MED ORDER — LIDOCAINE-EPINEPHRINE 1 %-1:100000 IJ SOLN
INTRAMUSCULAR | Status: DC | PRN
  Administered 2019-03-08: 30 mL

## 2019-03-08 MED ORDER — LIDOCAINE-EPINEPHRINE 1 %-1:100000 IJ SOLN
INTRAMUSCULAR | Status: AC
  Filled 2019-03-08: qty 1

## 2019-03-08 SURGICAL SUPPLY — 2 items
MONITOR REVEAL LINQ II (Prosthesis & Implant Heart) ×2 IMPLANT
PACK LOOP INSERTION (CUSTOM PROCEDURE TRAY) ×2 IMPLANT

## 2019-03-08 NOTE — Discharge Instructions (Signed)

## 2019-03-08 NOTE — Consult Note (Addendum)
ELECTROPHYSIOLOGY CONSULT NOTE  Patient ID: Douglas [name] MRN: [id number], DOB/AGE: 68-May-1952   Admit date: 03/05/2019 Date of Consult: 03/08/2019  Primary Physician: Patient, No Pcp Per Primary Cardiologist: No primary care provider on file.  Primary Electrophysiologist: New to Dr. Elberta Fortis Reason for Consultation: Cryptogenic stroke; recommendations regarding Implantable Loop Recorder  History of Present Illness EP has been asked to evaluate Douglas [name] for placement of an implantable loop recorder to monitor for atrial fibrillation by Dr Roda Shutters.  The patient was admitted on 03/05/2019 with h/o stroke with residual R sided weakness and aphasia and new altered mental status.  They first developed symptoms while at home eating breakfast.  Imaging demonstrated new extension of left PCA territory infarction to involve thalamus. MRI showed left MCA small infarct at outskirt of chronic stroke. CTA also showed chronic left M1 and left PCA cut off. They have undergone workup for stroke including echocardiogram and carotid dopplers.  The patient has been monitored on telemetry which has demonstrated sinus rhythm with no arrhythmias.  Inpatient stroke work-up Douglas [name] not require a TEE per Neurology.   Echocardiogram this admission demonstrated EF hyperdynamic at > 75%.  Lab work is reviewed.  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  Pt is aphasic and history was obtained from chart and from daughter via phone. They are recovering from their stroke with plans to rehab at SNF  at discharge. His daughter confirms that he has no known history of atrial fibrillation.  Past Medical History:  Diagnosis Date   Diabetes mellitus without complication (HCC)    Hypertension    Stroke [facility])      Surgical History:  Past Surgical History:  Procedure Laterality Date   IR PATIENT EVAL TECH 0-60 MINS  03/30/2018   TEE WITHOUT CARDIOVERSION N/A 04/02/2018   Procedure: TRANSESOPHAGEAL ECHOCARDIOGRAM  (TEE);  Surgeon: [name], MD;  Location: [facility] ENDOSCOPY;  Service: Cardiovascular;  Laterality: N/A;     Medications Prior to Admission  Medication Sig Dispense Refill Last Dose   amLODipine (NORVASC) 10 MG tablet Take 1 tablet (10 mg total) by mouth daily. 30 tablet 3 03/05/2019 at am   aspirin EC 325 MG EC tablet Take 1 tablet (325 mg total) by mouth daily. 30 tablet 0 03/05/2019 at 0830   atorvastatin (LIPITOR) 20 MG tablet Take 1 tablet (20 mg total) by mouth at bedtime. 30 tablet 3 03/04/2019 at pm   baclofen (LIORESAL) 10 MG tablet Take 20 mg by mouth 2 (two) times daily.    03/05/2019 at am   cloNIDine (CATAPRES) 0.1 MG tablet Take 0.1 mg by mouth See admin instructions. Take 0.1 mg by mouth at 4 PM daily and 0.1 mg at bedtime   03/04/2019 at pm   clopidogrel (PLAVIX) 75 MG tablet Take 1 tablet (75 mg total) by mouth daily. 30 tablet 0 03/05/2019 at 0830   glipiZIDE (GLUCOTROL) 10 MG tablet Take 1 tablet (10 mg total) by mouth 2 (two) times daily. 60 tablet 3 03/05/2019 at am   hydrALAZINE (APRESOLINE) 50 MG tablet Take 1 tablet (50 mg total) by mouth 3 (three) times daily. (Patient taking differently: Take 100 mg by mouth See admin instructions. Take 100 mg by mouth in the morning, 100 mg at noon, and 100 mg in the evening) 90 tablet 0 03/05/2019 at am   insulin detemir (LEVEMIR) 100 UNIT/ML injection Inject 0.25 mLs (25 Units total) into the skin 2 (two) times daily. (Patient taking differently: Inject  16 Units into the skin 3 (three) times daily as needed (if BGL is 120 or greater). ) 10 mL 11 03/05/2019 at am   lamoTRIgine (LAMICTAL) 100 MG tablet Take 100 mg by mouth 2 (two) times daily.   03/05/2019 at am   lisinopril (ZESTRIL) 20 MG tablet Take 20 mg by mouth 2 (two) times daily.   03/05/2019 at am   metoprolol succinate (TOPROL-XL) 25 MG 24 hr tablet Take 25 mg by mouth at bedtime.   03/03/2019 at 2100   NOVOLOG FLEXPEN 100 UNIT/ML FlexPen Inject 3 Units into the skin See  admin instructions. Inject 3-4 units into the skin up to two times a day before meals as needed for a BGL of 200 or greater- per sliding scale   03/05/2019 at am   tamsulosin (FLOMAX) 0.4 MG CAPS capsule Take 0.4 mg by mouth daily after supper.    03/04/2019 at pm   ACCU-CHEK AVIVA PLUS test strip daily.      cephALEXin (KEFLEX) 500 MG capsule Take 1 capsule (500 mg total) by mouth 4 (four) times daily. (Patient not taking: Reported on 03/05/2019) 28 capsule 0 Not Taking at Unknown time   cloNIDine (CATAPRES) 0.2 MG tablet Take 1 tablet (0.2 mg total) by mouth 2 (two) times daily. (Patient not taking: Reported on 03/05/2019) 60 tablet 6 Not Taking at Unknown time   Maltodextrin-Xanthan Gum (RESOURCE THICKENUP CLEAR) POWD Take 120 g by mouth as needed (nectar thick liquid consistency). (Patient not taking: Reported on 03/05/2019)   Not Taking at Unknown time   methylphenidate (RITALIN) 10 MG tablet Take 1 tablet (10 mg total) by mouth 2 (two) times daily with breakfast and lunch. (Patient not taking: Reported on 03/05/2019) 6 tablet 0 Not Taking at Unknown time   metoprolol succinate (TOPROL-XL) 50 MG 24 hr tablet Take 1 tablet (50 mg total) by mouth daily. (Patient not taking: Reported on 03/05/2019) 30 tablet 0 Not Taking at Unknown time   pantoprazole (PROTONIX) 40 MG tablet Take 1 tablet (40 mg total) by mouth daily. (Patient not taking: Reported on 03/05/2019) 30 tablet 0 Not Taking at Unknown time    Inpatient Medications:    stroke: mapping our early stages of recovery book   Does not apply Once   aspirin  325 mg Oral Daily   atorvastatin  40 mg Oral q1800   baclofen  20 mg Oral BID   clopidogrel  75 mg Oral Daily   enoxaparin (LOVENOX) injection  40 mg Subcutaneous Q24H   insulin aspart  0-15 Units Subcutaneous TID WC   insulin aspart  0-5 Units Subcutaneous QHS   lamoTRIgine  100 mg Oral BID   levETIRAcetam  500 mg Oral BID   pantoprazole  40 mg Oral Daily   tamsulosin  0.4  mg Oral QPC supper    Allergies:  Allergies  Allergen Reactions   Ciprofloxacin Anaphylaxis   Other Nausea And Vomiting and Other (See Comments)    [place]    Social History   Socioeconomic History   Marital status: Single    Spouse name: Not on file   Number of children: Not on file   Years of education: Not on file   Highest education level: Not on file  Occupational History   Not on file  Social Needs   Financial resource strain: Not on file   Food insecurity    Worry: Not on file    Inability: Not on file   Transportation needs  Medical: Not on file    Non-medical: Not on file  Tobacco Use   Smoking status: Never Smoker   Smokeless tobacco: Never Used  Substance and Sexual Activity   Alcohol use: Not Currently   Drug use: Never   Sexual activity: Not on file  Lifestyle   Physical activity    Days per week: Not on file    Minutes per session: Not on file   Stress: Not on file  Relationships   Social connections    Talks on phone: Not on file    Gets together: Not on file    Attends religious service: Not on file    Active member of club or organization: Not on file    Attends meetings of clubs or organizations: Not on file    Relationship status: Not on file   Intimate partner violence    Fear of current or ex partner: Not on file    Emotionally abused: Not on file    Physically abused: Not on file    Forced sexual activity: Not on file  Other Topics Concern   Not on file  Social History Narrative   Not on file     Family History  Problem Relation Age of Onset   Hypertension Mother    Hypertension Father       Review of Systems: All other systems reviewed and are otherwise negative except as noted above.  Physical Exam: Vitals:   03/07/19 1517 03/07/19 2000 03/07/19 2357 03/08/19 0318  BP: (!) 152/89 (!) 156/92 (!) 156/90 (!) 144/79  Pulse: 89 89 86 82  Resp: 17 17 18 18   Temp: 98.9 F (37.2 C) 98.9 F (37.2 C)  (!) 97.1 F (36.2 C) 98.3 F (36.8 C)  TempSrc: Oral Oral Oral Oral  SpO2: 100% 100% 100% 99%  Weight:      Height:        GEN- The patient is well appearing, alert and oriented x 3 today.   Head- normocephalic, atraumatic Eyes-  Sclera clear, conjunctiva pink Ears- hearing intact Oropharynx- clear Neck- supple Lungs- Clear to ausculation bilaterally, normal work of breathing Heart- Regular rate and rhythm, no murmurs, rubs or gallops  GI- soft, NT, ND, + BS Extremities- no clubbing, cyanosis, or edema MS- no significant deformity or atrophy Skin- no rash or lesion Psych- euthymic mood, full affect   Labs:   Lab Results  Component Value Date   WBC 5.9 03/08/2019   HGB 13.0 03/08/2019   HCT 40.0 03/08/2019   MCV 92.4 03/08/2019   PLT 219 03/08/2019    Recent Labs  Lab 03/05/19 1302  03/08/19 0251  NA 145   < > 140  K 4.0   < > 3.9  CL 104   < > 105  CO2 27  --  28  BUN 17   < > 16  CREATININE 1.12   < > 1.27*  CALCIUM 10.0  --  9.2  PROT 7.2  --   --   BILITOT 0.6  --   --   ALKPHOS 103  --   --   ALT 39  --   --   AST 19  --   --   GLUCOSE 209*   < > 199*   < > = values in this interval not displayed.     Radiology/Studies: Ct Angio Head W Or Wo Contrast  Result Date: 03/07/2019 CLINICAL DATA:  Stroke, follow-up EXAM: CT ANGIOGRAPHY HEAD AND NECK TECHNIQUE:  Multidetector CT imaging of the head and neck was performed using the standard protocol during bolus administration of intravenous contrast. Multiplanar CT image reconstructions and MIPs were obtained to evaluate the vascular anatomy. Carotid stenosis measurements (when applicable) are obtained utilizing NASCET criteria, using the distal internal carotid diameter as the denominator. CONTRAST:  30mL OMNIPAQUE IOHEXOL 350 MG/ML SOLN COMPARISON:  None. FINDINGS: CT HEAD FINDINGS Brain: No acute intracranial hemorrhage. Small left corona radiata infarcts on recent MRI are not seen. Large chronic infarcts of the  left MCA and PCA territories are again identified. There is a chronic small vessel infarct of the right cerebellar hemisphere. Additional patchy and confluent areas of hypoattenuation in the supratentorial white matter likely reflect chronic microvascular ischemic changes. There is ex vacuo dilatation of the left lateral ventricle. Additional prominence of the ventricles and sulci reflects generalized parenchymal volume loss. Vascular: Atherosclerotic calcification at the skull base. Skull: Unremarkable. Sinuses: Minor mucosal thickening. Orbits: Orbits are unremarkable. Review of the MIP images confirms the above findings CTA NECK FINDINGS Aortic arch: Great vessel origins are patent. Right carotid system: Common, internal, and external carotid arteries are patent. Mild multifocal calcified plaque along the common carotid without stenosis. Eccentric noncalcified plaque ICA origin of the left 50% stenosis. Left carotid system: Common, internal, and external carotid arteries are patent. Calcified and noncalcified plaque at the bifurcation and along the proximal internal carotid causing less than 50% stenosis. Diminished caliber of the remainder of the cervical left ICA relative to the right is again noted without focal stenosis. Vertebral arteries: Patent. Left vertebral artery is dominant. No significant stenosis. Skeleton: Stable multilevel degenerative changes the cervical spine. Other neck: No neck mass or adenopathy. Upper chest: No apical lung mass. Review of the MIP images confirms the above findings CTA HEAD FINDINGS Anterior circulation: Intracranial internal carotid arteries are patent with multifocal calcified plaque resulting in at least moderate stenosis. Anterior cerebral arteries are patent with moderate stenosis of the A1 segments. 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 Stable diffuse moderate stenosis of the right M1 MCA. Posterior circulation: Stable moderate-to-marked stenoses of the  proximal intracranial right vertebral artery, which becomes diminutive after the origin of the PICA. Stable moderate stenosis of the intracranial left vertebral artery. Basilar artery is diffusely irregular and decreased in caliber. The right posterior cerebral artery is patent with diffuse irregularity and moderate stenosis of the P2 segment. The left posterior cerebral artery is occluded proximally. Venous sinuses: Not well evaluated. Anatomic variants: None significant Review of the MIP images confirms the above findings IMPRESSION: No acute intracranial findings. Small acute infarct on MRI is not well seen. No large vessel occlusion. No hemodynamically significant stenosis in the neck. Multifocal intracranial atherosclerosis is stable from the prior study. Electronically Signed   By: Guadlupe Spanish M.D.   On: 03/07/2019 11:43   Ct Head Wo Contrast  Result Date: 03/05/2019 CLINICAL DATA:  Altered mental status EXAM: CT HEAD WITHOUT CONTRAST TECHNIQUE: Contiguous axial images were obtained from the base of the skull through the vertex without intravenous contrast. COMPARISON:  2019 FINDINGS: Brain: There is no acute hemorrhage or mass effect. There are large chronic infarcts involving the left PCA and MCA territories. There is new hypoattenuation involving the left thalamus. There is also new hypoattenuation within the medial left parietal lobe. New small focus of hypoattenuation within the right cerebellum likely reflects age-indeterminate infarction. Additional hypoattenuation in the supratentorial matter is nonspecific but likely reflects stable chronic microvascular ischemic changes.  There is increased ex vacuo dilatation of the lateral and third ventricles. Vascular: No hyperdense vessel. Atherosclerotic calcification at the skull base. Skull: Calvarium is unremarkable. Sinuses/Orbits: Patchy posterior left ethmoid mucosal thickening. Orbits are unremarkable. Other: Mastoid air cells are clear.  IMPRESSION: New extension of left PCA territory infarction to involve thalamus and more of the parietal lobe. This is likely subacute or chronic although a component of acute ischemia is difficult to exclude. Large chronic left MCA territory infarction. New age-indeterminate small right cerebellar infarct. No acute intracranial hemorrhage. Chronic microvascular ischemic changes. Electronically Signed   By: Guadlupe Spanish M.D.   On: 03/05/2019 14:47   Ct Angio Neck W Or Wo Contrast  Result Date: 03/07/2019 CLINICAL DATA:  Stroke, follow-up EXAM: CT ANGIOGRAPHY HEAD AND NECK TECHNIQUE: Multidetector CT imaging of the head and neck was performed using the standard protocol during bolus administration of intravenous contrast. Multiplanar CT image reconstructions and MIPs were obtained to evaluate the vascular anatomy. Carotid stenosis measurements (when applicable) are obtained utilizing NASCET criteria, using the distal internal carotid diameter as the denominator. CONTRAST:  75mL OMNIPAQUE IOHEXOL 350 MG/ML SOLN COMPARISON:  None. FINDINGS: CT HEAD FINDINGS Brain: No acute intracranial hemorrhage. Small left corona radiata infarcts on recent MRI are not seen. Large chronic infarcts of the left MCA and PCA territories are again identified. There is a chronic small vessel infarct of the right cerebellar hemisphere. Additional patchy and confluent areas of hypoattenuation in the supratentorial white matter likely reflect chronic microvascular ischemic changes. There is ex vacuo dilatation of the left lateral ventricle. Additional prominence of the ventricles and sulci reflects generalized parenchymal volume loss. Vascular: Atherosclerotic calcification at the skull base. Skull: Unremarkable. Sinuses: Minor mucosal thickening. Orbits: Orbits are unremarkable. Review of the MIP images confirms the above findings CTA NECK FINDINGS Aortic arch: Great vessel origins are patent. Right carotid system: Common, internal, and  external carotid arteries are patent. Mild multifocal calcified plaque along the common carotid without stenosis. Eccentric noncalcified plaque ICA origin of the left 50% stenosis. Left carotid system: Common, internal, and external carotid arteries are patent. Calcified and noncalcified plaque at the bifurcation and along the proximal internal carotid causing less than 50% stenosis. Diminished caliber of the remainder of the cervical left ICA relative to the right is again noted without focal stenosis. Vertebral arteries: Patent. Left vertebral artery is dominant. No significant stenosis. Skeleton: Stable multilevel degenerative changes the cervical spine. Other neck: No neck mass or adenopathy. Upper chest: No apical lung mass. Review of the MIP images confirms the above findings CTA HEAD FINDINGS Anterior circulation: Intracranial internal carotid arteries are patent with multifocal calcified plaque resulting in at least moderate stenosis. Anterior cerebral arteries are patent with moderate stenosis of the A1 segments. 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 Stable diffuse moderate stenosis of the right M1 MCA. Posterior circulation: Stable moderate-to-marked stenoses of the proximal intracranial right vertebral artery, which becomes diminutive after the origin of the PICA. Stable moderate stenosis of the intracranial left vertebral artery. Basilar artery is diffusely irregular and decreased in caliber. The right posterior cerebral artery is patent with diffuse irregularity and moderate stenosis of the P2 segment. The left posterior cerebral artery is occluded proximally. Venous sinuses: Not well evaluated. Anatomic variants: None significant Review of the MIP images confirms the above findings IMPRESSION: No acute intracranial findings. Small acute infarct on MRI is not well seen. No large vessel occlusion. No hemodynamically significant stenosis in the neck. Multifocal  intracranial atherosclerosis is  stable from the prior study. Electronically Signed   By: Guadlupe Spanish M.D.   On: 03/07/2019 11:43   Mr Brain Wo Contrast  Result Date: 03/05/2019 CLINICAL DATA:  Altered level of consciousness, unexplained. Unresponsive episode. EXAM: MRI HEAD WITHOUT CONTRAST TECHNIQUE: Multiplanar, multiecho pulse sequences of the brain and surrounding structures were obtained without intravenous contrast. COMPARISON:  Head CT 03/05/2019 and MRI 03/31/2018 FINDINGS: Brain: Large chronic infarcts are again seen involving the left MCA and left PCA territories with associated chronic hemosiderin deposition, ex vacuo dilatation of the left lateral ventricle, and wallerian degeneration extending into the brainstem. A 1 cm acute infarct is noted in the left corona radiata. Numerous chronic microhemorrhages are again seen in the deep gray nuclei, brainstem, and cerebellum compatible with chronic hypertension. There is a background of severe chronic small vessel ischemic disease in the cerebral white matter including chronic lacunar infarcts in the right corona radiata and deep gray nuclei as well as pons and cerebellum. A chronic lacunar infarct in the right cerebellar hemisphere is new from the prior MRI. There is mild leftward midline shift related to left cerebral hemispheric volume loss. No mass or extra-axial fluid collection is identified. Vascular: Abnormal appearance of the left MCA associated with the chronic infarct. Other major intracranial vascular flow voids are preserved. Skull and upper cervical spine: Unremarkable bone marrow signal. Sinuses/Orbits: Unremarkable orbits. Paranasal sinuses and mastoid air cells are clear. Other: None. IMPRESSION: 1. 1 cm acute left corona radiata infarct. 2. 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 3. Severe chronic small vessel ischemic disease with numerous chronic lacunar infarcts as above. Electronically Signed   By: Sebastian Ache M.D.   On: 03/05/2019 18:38   Dg Chest  Portable 1 View  Result Date: 03/05/2019 CLINICAL DATA:  68 year old male with vomiting and cough. Query aspiration. EXAM: PORTABLE CHEST 1 VIEW COMPARISON:  Portable chest 04/03/2018. FINDINGS: Portable AP semi upright view at 1315 hours. Stable somewhat low lung volumes. Mediastinal contours remain normal. Visualized tracheal air column is within normal limits. Stable lungs, mild apical scarring but otherwise allowing for portable technique the lungs are clear. Negative visible bowel gas pattern. No acute osseous abnormality identified. Chronic glenohumeral degeneration. IMPRESSION: No acute cardiopulmonary abnormality. Electronically Signed   By: Odessa Fleming M.D.   On: 03/05/2019 13:34   Vas US Carotid (at [facility] Only)  Result Date: 03/06/2019 Carotid Arterial Duplex Study Indications:       CVA. Comparison Study:  no prior Performing Technologist: Levada Schilling RDMS, RVT  Examination Guidelines: A complete evaluation includes B-mode imaging, spectral Doppler, color Doppler, and power Doppler as needed of all accessible portions of each vessel. Bilateral testing is considered an integral part of a complete examination. Limited examinations for reoccurring indications may be performed as noted.  Right Carotid Findings: +----------+--------+--------+--------+------------------+--------+             PSV cm/s EDV cm/s Stenosis Plaque Description Comments  +----------+--------+--------+--------+------------------+--------+  CCA Prox   94       18                heterogenous                 +----------+--------+--------+--------+------------------+--------+  CCA Distal 115      22                                             +----------+--------+--------+--------+------------------+--------+  ICA Prox   78       26       1-39%    heterogenous                 +----------+--------+--------+--------+------------------+--------+  ICA Distal 86       30                                              +----------+--------+--------+--------+------------------+--------+ +----------+--------+-------+----------------+-------------------+             PSV cm/s EDV cms Describe         Arm Pressure (mmHG)  +----------+--------+-------+----------------+-------------------+  Subclavian 130              Multiphasic, WNL                      +----------+--------+-------+----------------+-------------------+ +---------+--------+--+--------+--+----------------------------+  Vertebral PSV cm/s 64 EDV cm/s 20 Antegrade and High resistant  +---------+--------+--+--------+--+----------------------------+  Left Carotid Findings: +----------+--------+--------+--------+------------------+-----------------+             PSV cm/s EDV cm/s Stenosis Plaque Description Comments           +----------+--------+--------+--------+------------------+-----------------+  CCA Prox   85       9                                                       +----------+--------+--------+--------+------------------+-----------------+  CCA Distal 73       11                homogeneous                           +----------+--------+--------+--------+------------------+-----------------+  ICA Prox   30       7        1-39%    heterogenous       atypical waveform  +----------+--------+--------+--------+------------------+-----------------+  ICA Mid    71       14                                   atypical waveform  +----------+--------+--------+--------+------------------+-----------------+  ICA Distal 87       17                                   atypical waveform  +----------+--------+--------+--------+------------------+-----------------+  ECA        349      31       >50%     heterogenous                          +----------+--------+--------+--------+------------------+-----------------+ +----------+--------+--------+----------------+-------------------+             PSV cm/s EDV cm/s Describe         Arm Pressure (mmHG)   +----------+--------+--------+----------------+-------------------+  Subclavian 150               Multiphasic, WNL                      +----------+--------+--------+----------------+-------------------+ +---------+--------+--+--------+--+---------+  Vertebral PSV cm/s 53 EDV cm/s 16 Antegrade  +---------+--------+--+--------+--+---------+  Summary: Right Carotid: Velocities in the right ICA are consistent with a 1-39% stenosis. Left Carotid: Velocities in the left ICA are consistent with a 1-39% stenosis.               Dampened, high resistant ICA waveforms suggestive of a more distal               obstruction.                Left ECA velocities consistent with >50% stenosis.  *See table(s) above for measurements and observations.  Electronically signed by Gretta Began MD on 03/06/2019 at 6:25:38 PM.    Final    Vas [place] Lower Extremity Venous (dvt) (only [facility])  Result Date: 02/21/2019  Lower Venous Study Indications: Edema.  Performing Technologist: Blanch Media RVS  Examination Guidelines: A complete evaluation includes B-mode imaging, spectral Doppler, color Doppler, and power Doppler as needed of all accessible portions of each vessel. Bilateral testing is considered an integral part of a complete examination. Limited examinations for reoccurring indications may be performed as noted.  +---------+---------------+---------+-----------+----------+--------------+  RIGHT     Compressibility Phasicity Spontaneity Properties Thrombus Aging  +---------+---------------+---------+-----------+----------+--------------+  CFV       Full            Yes       Yes                                    +---------+---------------+---------+-----------+----------+--------------+  SFJ       Full                                                             +---------+---------------+---------+-----------+----------+--------------+  FV Prox   Full                                                              +---------+---------------+---------+-----------+----------+--------------+  FV Mid    Full                                                             +---------+---------------+---------+-----------+----------+--------------+  FV Distal Full                                                             +---------+---------------+---------+-----------+----------+--------------+  PFV       Full                                                             +---------+---------------+---------+-----------+----------+--------------+  POP       Full            Yes       Yes                                    +---------+---------------+---------+-----------+----------+--------------+  PTV       Full                                                             +---------+---------------+---------+-----------+----------+--------------+  PERO      Full                                                             +---------+---------------+---------+-----------+----------+--------------+     Summary: Right: There is no evidence of deep vein thrombosis in the lower extremity. No cystic structure found in the popliteal fossa.  *See table(s) above for measurements and observations. Electronically signed by Lemar Livings MD on 02/21/2019 at 2:16:28 PM.    Final     12-lead ECG 03/05/2019 shows NSR at 99 bpm (personally reviewed) All prior EKG's in EPIC reviewed with no documented atrial fibrillation  Telemetry NSR 70-80s, with occasional sinus tach (personally reviewed)  Assessment and Plan:  1. Cryptogenic stroke The patient presents with cryptogenic stroke.  The patient does not have a TEE planned for this AM.  I spoke at length with the patient about monitoring for afib with an implantable loop recorder.  Risks, benefits, and alteratives to implantable loop recorder were discussed with the patient today.   At this time, the patient's daughter (HC-POA) is very clear in their decision to proceed with implantable loop  recorder.   Wound care was reviewed with the patient's family (keep incision clean and dry for 3 days).  Wound check scheduled and entered in AVS. Please call with questions.   Graciella Freer, PA-C 03/08/2019 7:41 AM  I have seen and examined this patient with Otilio Saber.  Agree with above, note added to reflect my findings.  On exam, RRR, no murmurs, lungs clear.  Patient presented to the hospital with cryptogenic stroke. To date, no cause has been found. TEE planned for today. If unrevealing, Arnelle Nale plan for LINQ monitor to look for atrial fibrillation. Risks and benefits discussed. Risks include but not limited to bleeding and infection. The patient understands the risks and has agreed to the procedure.  Demyah Smyre M. Yatzil Clippinger MD 03/08/2019 8:56 AM

## 2019-03-08 NOTE — Progress Notes (Addendum)
STROKE TEAM PROGRESS NOTE   INTERVAL HISTORY Daughter at the bedside.  Patient sitting in chair, having lunch, ate all his meal.  As per daughter, patient now lives in [place] with her, will consider loop recorder this time.  Daughter is also seeking for skilled nursing facility for placement.  Vitals:   03/07/19 2357 03/08/19 0318 03/08/19 0742 03/08/19 1131  BP: (!) 156/90 (!) 144/79 (!) 154/78 (!) 162/88  Pulse: 86 82 82 88  Resp: 18 18 20 15   Temp: (!) 97.1 F (36.2 C) 98.3 F (36.8 C) 98.4 F (36.9 C) 98.9 F (37.2 C)  TempSrc: Oral Oral Oral Oral  SpO2: 100% 99% 100% 100%  Weight:      Height:        CBC:  Recent Labs  Lab 03/05/19 1302  03/05/19 1720 03/08/19 0251  WBC 7.8  --  7.4 5.9  NEUTROABS 6.6  --   --   --   HGB 14.4   < > 13.8 13.0  HCT 45.3   < > 43.3 40.0  MCV 93.2  --  92.9 92.4  PLT 239  --  231 219   < > = values in this interval not displayed.    Basic Metabolic Panel:  Recent Labs  Lab 03/05/19 1302 03/05/19 1416 03/05/19 1720 03/08/19 0251  NA 145 144  --  140  K 4.0 3.9  --  3.9  CL 104 104  --  105  CO2 27  --   --  28  GLUCOSE 209* 202*  --  199*  BUN 17 20  --  16  CREATININE 1.12 1.00 0.94 1.27*  CALCIUM 10.0  --   --  9.2   Lipid Panel:     Component Value Date/Time   CHOL 119 03/06/2019 0741   TRIG 76 03/06/2019 0741   HDL 36 (L) 03/06/2019 0741   CHOLHDL 3.3 03/06/2019 0741   VLDL 15 03/06/2019 0741   LDLCALC 68 03/06/2019 0741   HgbA1c:  Lab Results  Component Value Date   HGBA1C 7.2 (H) 03/06/2019   Urine Drug Screen:     Component Value Date/Time   LABOPIA NONE DETECTED 03/05/2019 1436   COCAINSCRNUR NONE DETECTED 03/05/2019 1436   LABBENZ NONE DETECTED 03/05/2019 1436   AMPHETMU NONE DETECTED 03/05/2019 1436   THCU NONE DETECTED 03/05/2019 1436   LABBARB NONE DETECTED 03/05/2019 1436    Alcohol Level     Component Value Date/Time   ETH <10 03/05/2019 1302    IMAGING Ct Angio Head W Or Wo  Contrast  Result Date: 03/07/2019 CLINICAL DATA:  Stroke, follow-up EXAM: CT ANGIOGRAPHY HEAD AND NECK TECHNIQUE: Multidetector CT imaging of the head and neck was performed using the standard protocol during bolus administration of intravenous contrast. Multiplanar CT image reconstructions and MIPs were obtained to evaluate the vascular anatomy. Carotid stenosis measurements (when applicable) are obtained utilizing NASCET criteria, using the distal internal carotid diameter as the denominator. CONTRAST:  63mL OMNIPAQUE IOHEXOL 350 MG/ML SOLN COMPARISON:  None. FINDINGS: CT HEAD FINDINGS Brain: No acute intracranial hemorrhage. Small left corona radiata infarcts on recent MRI are not seen. Large chronic infarcts of the left MCA and PCA territories are again identified. There is a chronic small vessel infarct of the right cerebellar hemisphere. Additional patchy and confluent areas of hypoattenuation in the supratentorial white matter likely reflect chronic microvascular ischemic changes. There is ex vacuo dilatation of the left lateral ventricle. Additional prominence of the ventricles  and sulci reflects generalized parenchymal volume loss. Vascular: Atherosclerotic calcification at the skull base. Skull: Unremarkable. Sinuses: Minor mucosal thickening. Orbits: Orbits are unremarkable. Review of the MIP images confirms the above findings CTA NECK FINDINGS Aortic arch: Great vessel origins are patent. Right carotid system: Common, internal, and external carotid arteries are patent. Mild multifocal calcified plaque along the common carotid without stenosis. Eccentric noncalcified plaque ICA origin of the left 50% stenosis. Left carotid system: Common, internal, and external carotid arteries are patent. Calcified and noncalcified plaque at the bifurcation and along the proximal internal carotid causing less than 50% stenosis. Diminished caliber of the remainder of the cervical left ICA relative to the right is again  noted without focal stenosis. Vertebral arteries: Patent. Left vertebral artery is dominant. No significant stenosis. Skeleton: Stable multilevel degenerative changes the cervical spine. Other neck: No neck mass or adenopathy. Upper chest: No apical lung mass. Review of the MIP images confirms the above findings CTA HEAD FINDINGS Anterior circulation: Intracranial internal carotid arteries are patent with multifocal calcified plaque resulting in at least moderate stenosis. Anterior cerebral arteries are patent with moderate stenosis of the A1 segments. 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 Stable diffuse moderate stenosis of the right M1 MCA. Posterior circulation: Stable moderate-to-marked stenoses of the proximal intracranial right vertebral artery, which becomes diminutive after the origin of the PICA. Stable moderate stenosis of the intracranial left vertebral artery. Basilar artery is diffusely irregular and decreased in caliber. The right posterior cerebral artery is patent with diffuse irregularity and moderate stenosis of the P2 segment. The left posterior cerebral artery is occluded proximally. Venous sinuses: Not well evaluated. Anatomic variants: None significant Review of the MIP images confirms the above findings IMPRESSION: No acute intracranial findings. Small acute infarct on MRI is not well seen. No large vessel occlusion. No hemodynamically significant stenosis in the neck. Multifocal intracranial atherosclerosis is stable from the prior study. Electronically Signed   By: Guadlupe Spanish M.D.   On: 03/07/2019 11:43   Ct Angio Neck W Or Wo Contrast  Result Date: 03/07/2019 CLINICAL DATA:  Stroke, follow-up EXAM: CT ANGIOGRAPHY HEAD AND NECK TECHNIQUE: Multidetector CT imaging of the head and neck was performed using the standard protocol during bolus administration of intravenous contrast. Multiplanar CT image reconstructions and MIPs were obtained to evaluate the vascular anatomy.  Carotid stenosis measurements (when applicable) are obtained utilizing NASCET criteria, using the distal internal carotid diameter as the denominator. CONTRAST:  75mL OMNIPAQUE IOHEXOL 350 MG/ML SOLN COMPARISON:  None. FINDINGS: CT HEAD FINDINGS Brain: No acute intracranial hemorrhage. Small left corona radiata infarcts on recent MRI are not seen. Large chronic infarcts of the left MCA and PCA territories are again identified. There is a chronic small vessel infarct of the right cerebellar hemisphere. Additional patchy and confluent areas of hypoattenuation in the supratentorial white matter likely reflect chronic microvascular ischemic changes. There is ex vacuo dilatation of the left lateral ventricle. Additional prominence of the ventricles and sulci reflects generalized parenchymal volume loss. Vascular: Atherosclerotic calcification at the skull base. Skull: Unremarkable. Sinuses: Minor mucosal thickening. Orbits: Orbits are unremarkable. Review of the MIP images confirms the above findings CTA NECK FINDINGS Aortic arch: Great vessel origins are patent. Right carotid system: Common, internal, and external carotid arteries are patent. Mild multifocal calcified plaque along the common carotid without stenosis. Eccentric noncalcified plaque ICA origin of the left 50% stenosis. Left carotid system: Common, internal, and external carotid arteries are patent. Calcified and  noncalcified plaque at the bifurcation and along the proximal internal carotid causing less than 50% stenosis. Diminished caliber of the remainder of the cervical left ICA relative to the right is again noted without focal stenosis. Vertebral arteries: Patent. Left vertebral artery is dominant. No significant stenosis. Skeleton: Stable multilevel degenerative changes the cervical spine. Other neck: No neck mass or adenopathy. Upper chest: No apical lung mass. Review of the MIP images confirms the above findings CTA HEAD FINDINGS Anterior  circulation: Intracranial internal carotid arteries are patent with multifocal calcified plaque resulting in at least moderate stenosis. Anterior cerebral arteries are patent with moderate stenosis of the A1 segments. 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 Stable diffuse moderate stenosis of the right M1 MCA. Posterior circulation: Stable moderate-to-marked stenoses of the proximal intracranial right vertebral artery, which becomes diminutive after the origin of the PICA. Stable moderate stenosis of the intracranial left vertebral artery. Basilar artery is diffusely irregular and decreased in caliber. The right posterior cerebral artery is patent with diffuse irregularity and moderate stenosis of the P2 segment. The left posterior cerebral artery is occluded proximally. Venous sinuses: Not well evaluated. Anatomic variants: None significant Review of the MIP images confirms the above findings IMPRESSION: No acute intracranial findings. Small acute infarct on MRI is not well seen. No large vessel occlusion. No hemodynamically significant stenosis in the neck. Multifocal intracranial atherosclerosis is stable from the prior study. Electronically Signed   By: Guadlupe Spanish M.D.   On: 03/07/2019 11:43   Vas US Carotid (at [facility])  Result Date: 03/06/2019 Carotid Arterial Duplex Study Indications:       CVA. Comparison Study:  no prior Performing Technologist: Levada Schilling RDMS, RVT  Examination Guidelines: A complete evaluation includes B-mode imaging, spectral Doppler, color Doppler, and power Doppler as needed of all accessible portions of each vessel. Bilateral testing is considered an integral part of a complete examination. Limited examinations for reoccurring indications may be performed as noted.  Right Carotid Findings: +----------+--------+--------+--------+------------------+--------+           PSV cm/sEDV cm/sStenosisPlaque DescriptionComments  +----------+--------+--------+--------+------------------+--------+ CCA Prox  94      18              heterogenous               +----------+--------+--------+--------+------------------+--------+ CCA Distal115     22                                         +----------+--------+--------+--------+------------------+--------+ ICA Prox  78      26      1-39%   heterogenous               +----------+--------+--------+--------+------------------+--------+ ICA Distal86      30                                         +----------+--------+--------+--------+------------------+--------+ +----------+--------+-------+----------------+-------------------+           PSV cm/sEDV cmsDescribe        Arm Pressure (mmHG) +----------+--------+-------+----------------+-------------------+ Subclavian130            Multiphasic, WNL                    +----------+--------+-------+----------------+-------------------+ +---------+--------+--+--------+--+----------------------------+ VertebralPSV cm/s64EDV cm/s20Antegrade and High resistant +---------+--------+--+--------+--+----------------------------+  Left Carotid Findings: +----------+--------+--------+--------+------------------+-----------------+           PSV cm/sEDV cm/sStenosisPlaque DescriptionComments          +----------+--------+--------+--------+------------------+-----------------+ CCA Prox  85      9                                                   +----------+--------+--------+--------+------------------+-----------------+ CCA Distal73      11              homogeneous                         +----------+--------+--------+--------+------------------+-----------------+ ICA Prox  30      7       1-39%   heterogenous      atypical waveform +----------+--------+--------+--------+------------------+-----------------+ ICA Mid   71      14                                atypical waveform  +----------+--------+--------+--------+------------------+-----------------+ ICA Distal87      17                                atypical waveform +----------+--------+--------+--------+------------------+-----------------+ ECA       349     31      >50%    heterogenous                        +----------+--------+--------+--------+------------------+-----------------+ +----------+--------+--------+----------------+-------------------+           PSV cm/sEDV cm/sDescribe        Arm Pressure (mmHG) +----------+--------+--------+----------------+-------------------+ Subclavian150             Multiphasic, WNL                    +----------+--------+--------+----------------+-------------------+ +---------+--------+--+--------+--+---------+ VertebralPSV cm/s53EDV cm/s16Antegrade +---------+--------+--+--------+--+---------+  Summary: Right Carotid: Velocities in the right ICA are consistent with a 1-39% stenosis. Left Carotid: Velocities in the left ICA are consistent with a 1-39% stenosis.               Dampened, high resistant ICA waveforms suggestive of a more distal               obstruction.                Left ECA velocities consistent with >50% stenosis.  *See table(s) above for measurements and observations.  Electronically signed by Gretta Beganodd Early MD on 03/06/2019 at 6:25:38 PM.    Final     PHYSICAL EXAM   Temp:  [97.1 F (36.2 C)-98.9 F (37.2 C)] 98.9 F (37.2 C) (11/06 1131) Pulse Rate:  [82-89] 88 (11/06 1131) Resp:  [15-20] 15 (11/06 1131) BP: (144-162)/(78-92) 162/88 (11/06 1131) SpO2:  [99 %-100 %] 100 % (11/06 1131)  General - Well nourished, well developed, in no apparent distress.  Ophthalmologic - fundi not visualized due to noncooperation.  Cardiovascular - Regular rhythm and rate.  Neuro - awake, alert, global aphasia, not following commands and only say "yes" to everything, not able to name or repeat. Intermittent laughing, sometimes appropriate and  sometimes inappropriate. Blinking to visual threat on the left, but not on the right. PERRL, EOMI, tracking bilaterally.  Right facial droop, tongue protrusion not cooperative. LUE 5/5 and LLE at least 4/5. RUE spastic plegia, RLE 2/5 proximal and 0/5 distal. Sensation, coordination and gait not tested.   ASSESSMENT/PLAN Mr. Brinson Tozzi is a 68 y.o. 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   Seizure   Presentation more consistent with seizure episode (AMS, staring off, urinary incontinence, lasting one min)  Old left MCA and PCA infarct could be seizure focus  EEG no seizure, left cortical dysfunction  On lamictal  bid PTA  Continue lamictal   On keppra 500 bid  Follow up with neurology as outpt  Stroke: New small L CR infarct in setting of old large L MCA and L PCA infarcts with chronic left M1 and PCA occlusion - ?? Transient hypotension during seizure ?? Vs. 5 BP meds at home  CT head extension of prior L PCA infarct to involve thalamus and parietal lobe.old large L MCA infarct. New small R cerebellar infarct, age indeterminate. No hemorrhage. Small vessel disease.   MRI  Small L corona radiata infarct. Large chronic L MCA and L PCA infarcts. Multiple chronic lacunes.  CTA head & neck no ELVO. 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  Carotid Doppler  L ICA dampened waveform suggests distal obstruction  2D Echo EF > 75%. No source of embolus   LDL 68  HgbA1c 7.2  Lovenox 40 mg sq daily for VTE prophylaxis  aspirin 325 mg daily and clopidogrel 75 mg daily prior to admission, now on aspirin 325 mg daily and clopidogrel 75 mg daily. Continue on discharge.   Therapy recommendations:  SNF  Disposition:  pending   History stroke  Will consider loop recorder this time since patient is living  with daughter in [place] now. EP consulted. To place prior to d/c. NO TEE needed from stroke standpoint.  As per daughter, patient never follow-up with neurology in [place].  Since June this year, patient lives in [place] with daughter.  03/30/2018 -  Large left MCA territory infarct - embolic pattern - unknown source, cardioembolic vs. Diffuse severe intracranial athero.  CTA head and neck showed left M1 and left P1 cutoff, right M1, P2 and left VA high-grade stenosis.  TTE negative, no PFO, DVT negative.  LDL 60 and A1c 7.3.  Treated in ICU w/ 3%.  On discharge DAPT x 3 mos given significant intracranial atherosclerosis. Loop recorder suggested but not placed as pt from [facility] with plans to f/u there.  Resulted global aphasia and right spastic hemiplegia.  2013 in [place]. CT w/ old L PCA territory infarct. Residual R HP, hemianopia. Could walk w/ walker but needed help with ADLs at baseline  Hypertension  Stable . Permissive hypertension (OK if < 220/120) but gradually normalize in 5-7 days . Long-term BP goal 130-150 given intracranial vascular occlusion . Avoid low BP . Pt has 5 BP meds PTA, including amlodipine, clonidine, hydralazine, lisinopril and metoprolol. Would adjust meds to maintain BP goal 130-150.   Hyperlipidemia  Home meds:  Lipitor 20  Now on lipitor 40  LDL 68, goal < 70  Continue statin at discharge  Diabetes type II Uncontrolled  HgbA1c 7.2, goal < 7.0  CBGs  SSI  Hyperglycemia  Close PCP follow-up as outpatient  Other Stroke Risk Factors  Advanced age  Hx ETOH use  Other Active Problems  UTI w/ proteus -on Rocephin  BPH  Cre 1.27  Hospital day # 3  I had long discussion with daughter at bedside, updated pt current condition, treatment plan and potential prognosis, and answered all the questions.  She expressed understanding and appreciation.   Neurology will sign off. Please call with questions. Pt will follow up with stroke clinic Dr.  Pearlean Brownie at [facility] in about 4 weeks. Thanks for the consult.  [name], MD PhD Stroke Neurology 03/08/2019 2:06 PM   To contact Stroke Continuity provider, please refer to [email or web address]. After hours, contact General Neurology

## 2019-03-08 NOTE — Progress Notes (Signed)
SLP Cancellation Note  Patient Details Name: Douglas [name] MRN: [id number] DOB: 03-Dec-1950   Cancelled treatment:        MBS cancelled per RN reported pt is combative when transport arrived. Unable to travel. Will attempt MBS at next available time.   Wynelle Bourgeois, MA, CCC-SLP 03/08/2019, 9:29 AM

## 2019-03-08 NOTE — Evaluation (Signed)
Speech Language Pathology Evaluation Patient Details Name: Douglas [name] MRN: [id number] DOB: 08/08/1950 Today's Date: 03/08/2019 Time: 2440-1027 SLP Time Calculation (min) (ACUTE ONLY): 39 min  Problem List:  Patient Active Problem List   Diagnosis Date Noted  . Ischemic stroke ([place]) 03/05/2019  . UTI (urinary tract infection) 03/05/2019  . Acute blood loss anemia   . Labile blood pressure   . 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 Labile blood glucose   . Global aphasia   . Cerebral edema ([place]) 04/04/2018  . Renal insufficiency 04/04/2018  . 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 Acute lower UTI   . Type 2 diabetes mellitus with peripheral neuropathy ([facility])   . Acute embolic stroke ([place])   . Diabetes 1.5, managed as type 2 ([place])   . Essential hypertension   . Dyslipidemia   . History of CVA with residual deficit   . Dysphagia, post-stroke    Past Medical History:  Past Medical History:  Diagnosis Date  . Diabetes mellitus without complication ([place])   . Hypertension   . Stroke [facility])    Past Surgical History:  Past Surgical History:  Procedure Laterality Date  . IR PATIENT EVAL TECH 0-60 MINS  03/30/2018  . TEE WITHOUT CARDIOVERSION N/A 04/02/2018   Procedure: TRANSESOPHAGEAL ECHOCARDIOGRAM (TEE);  Surgeon: Skeet Latch, MD;  Location: [facility] ENDOSCOPY;  Service: Cardiovascular;  Laterality: N/A;   HPI:  Douglas [name] is a 68 y.o. 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 MRI showing 1 cm acute L corona radiata, chronic L MCA & PCA.  Pt was on CIR in December 2019 following his left MCA and ACA CVA and was on a dys2/nectar diet upon dc and was working on communicating basic needs via multimodal communication.  Pt failed [facility] as she stopped drinking water.    Assessment / Plan / Recommendation Clinical Impression  Pt presents with expressive and receptive aphasia evidenced by single word steretypical utterances, decreased ability to follow one step commands and inability to mimic/repeat vowel sounds or CV with visual/verbal cue from SLP.     Unfortunately his motor planning difficulties impact all communication and limb use.  Use of functional cues and frequently stated single step directions were very helpful.  Pt was able to identify his first name from choice of 2 and did appropriately identify cup as well as stated "no" and nodded head with question in ADL.  Automatic speech tasks including counting and singing conducted with moderate verbal/visual cues overall - and this provides pt with success to improve output.  In addition, pt responds well to social cues which aids communication.   Encouraged pt to continue to point to items he desires, etc as multimodal communication will faciliate his expression.  Recommend skilled SlP to maximize speech/language/cognitive function with participate in self care in current environment.    SLP Assessment  SLP Recommendation/Assessment: Patient needs continued Speech Lanaguage Pathology Services SLP Visit Diagnosis: Aphasia (R47.01);Apraxia (R48.2);Cognitive communication deficit (R41.841)    Follow Up Recommendations  Other (comment);24 hour supervision/assistance    Frequency and Duration min 1 x/week  1 week      SLP Evaluation Cognition  Orientation Level: Oriented to person(difficult to assess due to pt's aphasia) Attention: Focused;Sustained Focused Attention: Appears intact Sustained Attention: Appears intact Awareness: Impaired Awareness Impairment: Emergent  impairment;Intellectual impairment(pt tends to eat at rapid reate without clearing oral cavity before taking more) Problem Solving: Impaired Problem Solving Impairment: Functional basic(locating items on meal tray, calling for  assist) Behaviors: Restless       Comprehension  Auditory Comprehension Overall Auditory Comprehension: Impaired Yes/No Questions: Impaired Basic Biographical Questions: 0-25% accurate Basic Immediate Environment Questions: 0-24% accurate Commands: Impaired One Step Basic Commands: 75-100% accurate Interfering Components: Motor planning;Processing speed EffectiveTechniques: Visual/Gestural cues;Extra processing time;Stressing words Reading Comprehension Reading Status: (pt was able to identify his name from choice of 2) Sentence Level: Not tested Paragraph Level: Not tested Functional Environmental (signs, name badge): Not tested Effective Techniques: Large print    Expression Expression Primary Mode of Expression: Nonverbal - gestures(verbalizes stereotypical utterances at times) Verbal Expression Initiation: No impairment Repetition: Impaired Level of Impairment: (single vowel impairment) Naming: Not tested Pragmatics: Unable to assess(pt nods yes when speaker is nodding at him with questions- mimics speaker) Written Expression Dominant Hand: Right Written Expression: Not tested(pt with right hemiparesis)   Oral / Motor  Oral Motor/Sensory Function Overall Oral Motor/Sensory Function: Moderate impairment Facial ROM: Reduced right Facial Symmetry: Abnormal symmetry right Facial Strength: Reduced right Facial Sensation: Reduced right Lingual ROM: Reduced right Lingual Symmetry: (pt does not protrude tongue on command- motor planning difficulties) Lingual Sensation: Reduced Velum: Within Functional Limits Mandible: (dnt) Motor Speech Overall Motor Speech: Impaired Respiration: Impaired Resonance: (no hypernasality noted as pt verbalizese single words) Articulation: (pt only verbalizes yes, hey and the occasional no - singing happy birthday was [facility] - no overt articulation deficits) Intelligibility: Unable to assess (comment) Motor Planning: Impaired Level of  Impairment: Word Motor Speech Errors: Unaware;Inconsistent Effective Techniques: (MIT)   GO                    [name] 03/08/2019, 9:21 AM   Donavan Burnet, MS [facility] SLP Acute Rehab Services Pager [phone] Office [phone]

## 2019-03-08 NOTE — Progress Notes (Addendum)
PROGRESS NOTE    Douglas [name]  ZOX:[id number] DOB: August 28, 1950 DOA: 03/05/2019 PCP: Patient, No Pcp Per   Brief Narrative: 68 year old male with history of stroke with residual right-sided weakness, aphasia, hypertension, diabetes presented to the ER due to altered mental status, episode of vomiting.  Apparently patient was not acting like himself was confused stating at her and episode lasted for extremity 1 minute before returning to the baseline.  Daughter called EMS and was concerned about discharge as patient had similar symptoms in 2019 when he had acute stroke, also had episode of urinary incontinence but no seizure fall head trauma fever chills.  Was recently treated with Keflex for cystitis and has finished antibiotics.  In ER blood pressure slightly elevated.  Ethanol: WNL.  CBC, CMP, troponin: WNL.  UA positive for leukocytes and bacteria's.  UDS negative.  Blood culture, urine culture and COVID-19 all were pending.  CT head was obtained which came back positive for new extension of left PCA territory infarction to involve thalamus and more of the parietal lobe-likely subacute or chronic?.  EDP consulted neurology.  Subjective: Seen and examined  He is alert awake responsive and is moving his left side well. Somewhat gets agitated when examining. Overnight blood pressure ranging mostly 150s, 100% on room air.  Afebrile. Noted slight bump in creatinine to 1.2 previously 0.9-1.1  Per daughter normally follows commands speaks words.  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 Severe chronic small vessel ischemic disease with numerous chronic lacunar infarcts as above".  EEG's with cortical dysfunction in the left hemisphere likely secondary to underlying stroke, no seizures  or epileptiform discharges.  Per neurology presentation suggestive of seizure episode.  Lipid profile with LDL 68, HDL low at 36, hba1c 7.1, on lipitor 80 mg and s/p asa 300 rectally x1- now on plavix and aspirin, and started on Lipitor 80 mg.tte stable.CT angio neck pending.  Appreciate neurology input-allow permissive hypertension, monitor telemetry, neuro check PT OT and speech eval. plan for loop recorder this admission, continue dysphagia 3 diet as per speech.    Seizure, continue Lamictal, added Keppra 500 twice daily.  EEG with no acute seizure currently.  Continue seizure prophylaxis suspect secondary to old left MCA and PCA infarct as a seizure focus.  History of stroke with right-sided weakness/aphasia:Patient had previously on 325 aspirin,Plavix 75, lamictal, Ritalin.At baseline able to transfer with help from daughter at home, able to say few words like "hello", "tv" etcs.  UTI POA urine culture with Proteus.Recently treated with Keflex.  Continue ceftriaxone x2 more days, follow-up urine culture. Hemodynamically  Stable,is afebrile and no leukocytosis currently.  Essential hypertension: Allow permissive hypertension-long-term BP goal 1 30-1 50 given intracranial vascular occlusion.  Avoid hypotension. Plan for BP normalize over 5 to 7 days.Patient is on amlodipine, clonidine, hydralazine, lisinopril and metoprolol, and will resume slowly once okay with neurology. Discussed w Dr Roda Shutters- resume amlodipine at  from today and adjust slowly.  Dyslipidemia:Lipid profile stable, continue high intensity statin.    Type 2 diabetes mellitus with peripheral neuropathy : Hemoglobin A1c 7.2. Hold glipizide Lantus , cont ssi . Continue sliding scale insulin and monitor glucose. Recent Labs  Lab 03/07/19 0708 03/07/19 1115 03/07/19 1519 03/07/19 2139 03/08/19 0650  GLUCAP 156* 192* 278* 184* 182*   BPH continue on Flomax.  Body mass index is 24.03 kg/m.   DVT prophylaxis:SCD/lovenox Code  Status:Full Family Communication: No family at bedside. I had called and discussed and updated patient's daughter.  Disposition Plan: For skilled nursing facility placement once okay with neurology.  Discussed with social worker looking into SNF.  COVID 19 NEG on 11/3  Consultants:Neuro  Procedures: MRI BRAIN: 1. 1 cm acute left corona radiata infarct. 2. 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 3. Severe chronic small vessel ischemic disease with numerous chronic lacunar infarcts as above.  TTE  1. Left ventricular ejection fraction, by visual estimation, is >75%. The left ventricle has hyperdynamic function. There is no left ventricular hypertrophy.  2. Left ventricular diastolic parameters are consistent with Grade I diastolic dysfunction (impaired relaxation).  3. Global right ventricle has normal systolic function.The right ventricular size is normal.  4. Left atrial size was normal.  5. Right atrial size was normal.  6. The mitral valve is normal in structure. Trace mitral valve regurgitation. No evidence of mitral stenosis.  7. The tricuspid valve is normal in structure. Tricuspid valve regurgitation is trivial.  8. The aortic valve is tricuspid. Aortic valve regurgitation is not visualized. Mild aortic valve sclerosis without stenosis.  9. The pulmonic valve was normal in structure. Pulmonic valve regurgitation is not visualized. 10. The inferior vena cava is normal in size with greater than 50% respiratory variability, suggesting right atrial pressure of 3 mmHg. 11. Hyperdynamic LV systolic function; grade 1 diastolic dysfunction; trace MR and TR.  Microbiology:  Antimicrobials: Anti-infectives (From admission, onward)   Start     Dose/Rate Route Frequency Ordered Stop   03/06/19 1500  cefTRIAXone (ROCEPHIN) 1 g in sodium chloride 0.9 % 100 mL IVPB     1 g 200 mL/hr over 30 Minutes Intravenous Every 24 hours 03/05/19 1607     03/05/19 1530  cefTRIAXone (ROCEPHIN) 1 g  in sodium chloride 0.9 % 100 mL IVPB     1 g 200 mL/hr over 30 Minutes Intravenous  Once 03/05/19 1523 03/05/19 1636       Objective: Vitals:   03/07/19 2000 03/07/19 2357 03/08/19 0318 03/08/19 0742  BP: (!) 156/92 (!) 156/90 (!) 144/79 (!) 154/78  Pulse: 89 86 82 82  Resp: 17 18 18 20   Temp: 98.9 F (37.2 C) (!) 97.1 F (36.2 C) 98.3 F (36.8 C) 98.4 F (36.9 C)  TempSrc: Oral Oral Oral Oral  SpO2: 100% 100% 99% 100%  Weight:      Height:        Intake/Output Summary (Last 24 hours) at 03/08/2019 0754 Last data filed at 03/07/2019 2330 Gross per 24 hour  Intake 940 ml  Output 700 ml  Net 240 ml   Filed Weights   03/05/19 1236 03/05/19 2330  Weight: 82.6 kg 82.6 kg   Weight change:   Body mass index is 24.03 kg/m.  Intake/Output from previous day: 11/05 0701 - 11/06 0700 In: 940 [P.O.:840; IV Piggyback:100] Out: 700 [Urine:700] Intake/Output this shift: No intake/output data recorded.  Examination:  General exam: AA, aphasic ,NAD, weak/frail HEENT:Oral mucosa moist, Ear/Nose WNL grossly, dentition normal. Respiratory system: Diminished at the base,no wheezing or crackles, NT,no use of accessory muscle Cardiovascular system: S1 & S2 +, No JVD, regular RR. Gastrointestinal system: Abdomen soft, NT,ND, BS+ Nervous System:Alert, awake, aphasic, chronic weakness in the right lower extremity with increased tone, left-sided good strength.  Extremities: No edema, distal peripheral pulses palpable.  Skin: No rashes,no icterus. MSK: Normal  muscle bulk,tone, power  Medications:  Scheduled Meds:   stroke: mapping our early stages of recovery book   Does not apply Once   aspirin  325 mg Oral Daily   atorvastatin  40 mg Oral q1800   baclofen  20 mg Oral BID   clopidogrel  75 mg Oral Daily   enoxaparin (LOVENOX) injection  40 mg Subcutaneous Q24H   insulin aspart  0-15 Units Subcutaneous TID WC   insulin aspart  0-5 Units Subcutaneous QHS   lamoTRIgine   100 mg Oral BID   levETIRAcetam  500 mg Oral BID   pantoprazole  40 mg Oral Daily   tamsulosin  0.4 mg Oral QPC supper   Continuous Infusions:  cefTRIAXone (ROCEPHIN)  IV 1 g (03/07/19 1526)    Data Reviewed: I have personally reviewed following labs and imaging studies  CBC: Recent Labs  Lab 03/05/19 1302 03/05/19 1416 03/05/19 1720 03/08/19 0251  WBC 7.8  --  7.4 5.9  NEUTROABS 6.6  --   --   --   HGB 14.4 15.6 13.8 13.0  HCT 45.3 46.0 43.3 40.0  MCV 93.2  --  92.9 92.4  PLT 239  --  231 818   Basic Metabolic Panel: Recent Labs  Lab 03/05/19 1302 03/05/19 1416 03/05/19 1720 03/08/19 0251  NA 145 144  --  140  K 4.0 3.9  --  3.9  CL 104 104  --  105  CO2 27  --   --  28  GLUCOSE 209* 202*  --  199*  BUN 17 20  --  16  CREATININE 1.12 1.00 0.94 1.27*  CALCIUM 10.0  --   --  9.2   GFR: Estimated Creatinine Clearance: 62.9 mL/min (A) (by C-G formula based on SCr of 1.27 mg/dL (H)). Liver Function Tests: Recent Labs  Lab 03/05/19 1302  AST 19  ALT 39  ALKPHOS 103  BILITOT 0.6  PROT 7.2  ALBUMIN 4.0   No results for input(s): LIPASE, AMYLASE in the last 168 hours. No results for input(s): AMMONIA in the last 168 hours. Coagulation Profile: Recent Labs  Lab 03/05/19 1302  INR 1.1   Cardiac Enzymes: No results for input(s): CKTOTAL, CKMB, CKMBINDEX, TROPONINI in the last 168 hours. BNP (last 3 results) No results for input(s): PROBNP in the last 8760 hours. HbA1C: Recent Labs    03/05/19 1720 03/06/19 0741  HGBA1C 7.1* 7.2*   CBG: Recent Labs  Lab 03/07/19 0708 03/07/19 1115 03/07/19 1519 03/07/19 2139 03/08/19 0650  GLUCAP 156* 192* 278* 184* 182*   Lipid Profile: Recent Labs    03/06/19 0741  CHOL 119  HDL 36*  LDLCALC 68  TRIG 76  CHOLHDL 3.3   Thyroid Function Tests: No results for input(s): TSH, T4TOTAL, FREET4, T3FREE, THYROIDAB in the last 72 hours. Anemia Panel: No results for input(s): VITAMINB12, FOLATE, FERRITIN,  TIBC, IRON, RETICCTPCT in the last 72 hours. Sepsis Labs: No results for input(s): PROCALCITON, LATICACIDVEN in the last 168 hours.  Recent Results (from the past 240 hour(s))  Blood culture (routine x 2)     Status: None (Preliminary result)   Collection Time: 03/05/19  2:00 PM   Specimen: BLOOD LEFT FOREARM  Result Value Ref Range Status   Specimen Description BLOOD LEFT FOREARM  Final   Special Requests   Final    BOTTLES DRAWN AEROBIC AND ANAEROBIC Blood Culture adequate volume   Culture   Final    NO GROWTH 2 DAYS Performed  at [facility], [address], [place], [place] [address]    Report Status PENDING  Incomplete  Urine culture     Status: Abnormal   Collection Time: 03/05/19  2:36 PM   Specimen: Urine, Random  Result Value Ref Range Status   Specimen Description URINE, RANDOM  Final   Special Requests   Final    NONE Performed at [facility], [address], [place], [place] [address]    Culture >=100,000 COLONIES/mL PROTEUS MIRABILIS (A)  Final   Report Status 03/07/2019 FINAL  Final   Organism ID, Bacteria PROTEUS MIRABILIS (A)  Final      Susceptibility   Proteus mirabilis - MIC*    AMPICILLIN <=2 SENSITIVE Sensitive     CEFAZOLIN <=4 SENSITIVE Sensitive     CEFTRIAXONE <=1 SENSITIVE Sensitive     CIPROFLOXACIN <=0.25 SENSITIVE Sensitive     GENTAMICIN <=1 SENSITIVE Sensitive     IMIPENEM 4 SENSITIVE Sensitive     NITROFURANTOIN 128 RESISTANT Resistant     TRIMETH/SULFA <=20 SENSITIVE Sensitive     AMPICILLIN/SULBACTAM <=2 SENSITIVE Sensitive     PIP/TAZO <=4 SENSITIVE Sensitive     * >=100,000 COLONIES/mL PROTEUS MIRABILIS  SARS CORONAVIRUS 2 (TAT 6-24 HRS) Nasopharyngeal Nasopharyngeal Swab     Status: None   Collection Time: 03/05/19  3:24 PM   Specimen: Nasopharyngeal Swab  Result Value Ref Range Status   SARS Coronavirus 2 NEGATIVE NEGATIVE Final    Comment: (NOTE) SARS-CoV-2 target nucleic acids are NOT DETECTED. The SARS-CoV-2 RNA is  generally detectable in upper and lower respiratory specimens during the acute phase of infection. Negative results do not preclude SARS-CoV-2 infection, do not rule out co-infections with other pathogens, and should not be used as the sole basis for treatment or other patient management decisions. Negative results must be combined with clinical observations, patient history, and epidemiological information. The expected result is Negative. Fact Sheet for Patients: HairSlick.no Fact Sheet for Healthcare Providers: quierodirigir.com This test is not yet approved or cleared by the [place] FDA and  has been authorized for detection and/or diagnosis of SARS-CoV-2 by FDA under an Emergency Use Authorization (EUA). This EUA will remain  in effect (meaning this test can be used) for the duration of the COVID-19 declaration under Section 56 4(b)(1) of the Act, 21 U.S.C. section 360bbb-3(b)(1), unless the authorization is terminated or revoked sooner. Performed at [facility], [address], [place], [place] [address]   Blood culture (routine x 2)     Status: None (Preliminary result)   Collection Time: 03/05/19  3:49 PM   Specimen: BLOOD  Result Value Ref Range Status   Specimen Description BLOOD LEFT WRIST  Final   Special Requests   Final    BOTTLES DRAWN AEROBIC ONLY Blood Culture results may not be optimal due to an inadequate volume of blood received in culture bottles   Culture   Final    NO GROWTH 2 DAYS Performed at [facility], [address] [place], [place] [address]    Report Status PENDING  Incomplete      Radiology Studies: Ct Angio Head W Or Wo Contrast  Result Date: 03/07/2019 CLINICAL DATA:  Stroke, follow-up EXAM: CT ANGIOGRAPHY HEAD AND NECK TECHNIQUE: Multidetector CT imaging of the head and neck was performed using the standard protocol during bolus administration of intravenous contrast.  Multiplanar CT image reconstructions and MIPs were obtained to evaluate the vascular anatomy. Carotid  stenosis measurements (when applicable) are obtained utilizing NASCET criteria, using the distal internal carotid diameter as the denominator. CONTRAST:  75mL OMNIPAQUE IOHEXOL 350 MG/ML SOLN COMPARISON:  None. FINDINGS: CT HEAD FINDINGS Brain: No acute intracranial hemorrhage. Small left corona radiata infarcts on recent MRI are not seen. Large chronic infarcts of the left MCA and PCA territories are again identified. There is a chronic small vessel infarct of the right cerebellar hemisphere. Additional patchy and confluent areas of hypoattenuation in the supratentorial white matter likely reflect chronic microvascular ischemic changes. There is ex vacuo dilatation of the left lateral ventricle. Additional prominence of the ventricles and sulci reflects generalized parenchymal volume loss. Vascular: Atherosclerotic calcification at the skull base. Skull: Unremarkable. Sinuses: Minor mucosal thickening. Orbits: Orbits are unremarkable. Review of the MIP images confirms the above findings CTA NECK FINDINGS Aortic arch: Great vessel origins are patent. Right carotid system: Common, internal, and external carotid arteries are patent. Mild multifocal calcified plaque along the common carotid without stenosis. Eccentric noncalcified plaque ICA origin of the left 50% stenosis. Left carotid system: Common, internal, and external carotid arteries are patent. Calcified and noncalcified plaque at the bifurcation and along the proximal internal carotid causing less than 50% stenosis. Diminished caliber of the remainder of the cervical left ICA relative to the right is again noted without focal stenosis. Vertebral arteries: Patent. Left vertebral artery is dominant. No significant stenosis. Skeleton: Stable multilevel degenerative changes the cervical spine. Other neck: No neck mass or adenopathy. Upper chest: No apical lung  mass. Review of the MIP images confirms the above findings CTA HEAD FINDINGS Anterior circulation: Intracranial internal carotid arteries are patent with multifocal calcified plaque resulting in at least moderate stenosis. Anterior cerebral arteries are patent with moderate stenosis of the A1 segments. 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 Stable diffuse moderate stenosis of the right M1 MCA. Posterior circulation: Stable moderate-to-marked stenoses of the proximal intracranial right vertebral artery, which becomes diminutive after the origin of the PICA. Stable moderate stenosis of the intracranial left vertebral artery. Basilar artery is diffusely irregular and decreased in caliber. The right posterior cerebral artery is patent with diffuse irregularity and moderate stenosis of the P2 segment. The left posterior cerebral artery is occluded proximally. Venous sinuses: Not well evaluated. Anatomic variants: None significant Review of the MIP images confirms the above findings IMPRESSION: No acute intracranial findings. Small acute infarct on MRI is not well seen. No large vessel occlusion. No hemodynamically significant stenosis in the neck. Multifocal intracranial atherosclerosis is stable from the prior study. Electronically Signed   By: Guadlupe Spanish M.D.   On: 03/07/2019 11:43   Ct Angio Neck W Or Wo Contrast  Result Date: 03/07/2019 CLINICAL DATA:  Stroke, follow-up EXAM: CT ANGIOGRAPHY HEAD AND NECK TECHNIQUE: Multidetector CT imaging of the head and neck was performed using the standard protocol during bolus administration of intravenous contrast. Multiplanar CT image reconstructions and MIPs were obtained to evaluate the vascular anatomy. Carotid stenosis measurements (when applicable) are obtained utilizing NASCET criteria, using the distal internal carotid diameter as the denominator. CONTRAST:  75mL OMNIPAQUE IOHEXOL 350 MG/ML SOLN COMPARISON:  None. FINDINGS: CT HEAD FINDINGS Brain: No  acute intracranial hemorrhage. Small left corona radiata infarcts on recent MRI are not seen. Large chronic infarcts of the left MCA and PCA territories are again identified. There is a chronic small vessel infarct of the right cerebellar hemisphere. Additional patchy and confluent areas of hypoattenuation in the supratentorial white matter likely  reflect chronic microvascular ischemic changes. There is ex vacuo dilatation of the left lateral ventricle. Additional prominence of the ventricles and sulci reflects generalized parenchymal volume loss. Vascular: Atherosclerotic calcification at the skull base. Skull: Unremarkable. Sinuses: Minor mucosal thickening. Orbits: Orbits are unremarkable. Review of the MIP images confirms the above findings CTA NECK FINDINGS Aortic arch: Great vessel origins are patent. Right carotid system: Common, internal, and external carotid arteries are patent. Mild multifocal calcified plaque along the common carotid without stenosis. Eccentric noncalcified plaque ICA origin of the left 50% stenosis. Left carotid system: Common, internal, and external carotid arteries are patent. Calcified and noncalcified plaque at the bifurcation and along the proximal internal carotid causing less than 50% stenosis. Diminished caliber of the remainder of the cervical left ICA relative to the right is again noted without focal stenosis. Vertebral arteries: Patent. Left vertebral artery is dominant. No significant stenosis. Skeleton: Stable multilevel degenerative changes the cervical spine. Other neck: No neck mass or adenopathy. Upper chest: No apical lung mass. Review of the MIP images confirms the above findings CTA HEAD FINDINGS Anterior circulation: Intracranial internal carotid arteries are patent with multifocal calcified plaque resulting in at least moderate stenosis. Anterior cerebral arteries are patent with moderate stenosis of the A1 segments. 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  Stable diffuse moderate stenosis of the right M1 MCA. Posterior circulation: Stable moderate-to-marked stenoses of the proximal intracranial right vertebral artery, which becomes diminutive after the origin of the PICA. Stable moderate stenosis of the intracranial left vertebral artery. Basilar artery is diffusely irregular and decreased in caliber. The right posterior cerebral artery is patent with diffuse irregularity and moderate stenosis of the P2 segment. The left posterior cerebral artery is occluded proximally. Venous sinuses: Not well evaluated. Anatomic variants: None significant Review of the MIP images confirms the above findings IMPRESSION: No acute intracranial findings. Small acute infarct on MRI is not well seen. No large vessel occlusion. No hemodynamically significant stenosis in the neck. Multifocal intracranial atherosclerosis is stable from the prior study. Electronically Signed   By: Guadlupe Spanish M.D.   On: 03/07/2019 11:43   Vas US Carotid (at [facility] Only)  Result Date: 03/06/2019 Carotid Arterial Duplex Study Indications:       CVA. Comparison Study:  no prior Performing Technologist: Levada Schilling RDMS, RVT  Examination Guidelines: A complete evaluation includes B-mode imaging, spectral Doppler, color Doppler, and power Doppler as needed of all accessible portions of each vessel. Bilateral testing is considered an integral part of a complete examination. Limited examinations for reoccurring indications may be performed as noted.  Right Carotid Findings: +----------+--------+--------+--------+------------------+--------+             PSV cm/s EDV cm/s Stenosis Plaque Description Comments  +----------+--------+--------+--------+------------------+--------+  CCA Prox   94       18                heterogenous                 +----------+--------+--------+--------+------------------+--------+  CCA Distal 115      22                                              +----------+--------+--------+--------+------------------+--------+  ICA Prox   78       26       1-39%  heterogenous                 +----------+--------+--------+--------+------------------+--------+  ICA Distal 86       30                                             +----------+--------+--------+--------+------------------+--------+ +----------+--------+-------+----------------+-------------------+             PSV cm/s EDV cms Describe         Arm Pressure (mmHG)  +----------+--------+-------+----------------+-------------------+  Subclavian 130              Multiphasic, WNL                      +----------+--------+-------+----------------+-------------------+ +---------+--------+--+--------+--+----------------------------+  Vertebral PSV cm/s 64 EDV cm/s 20 Antegrade and High resistant  +---------+--------+--+--------+--+----------------------------+  Left Carotid Findings: +----------+--------+--------+--------+------------------+-----------------+             PSV cm/s EDV cm/s Stenosis Plaque Description Comments           +----------+--------+--------+--------+------------------+-----------------+  CCA Prox   85       9                                                       +----------+--------+--------+--------+------------------+-----------------+  CCA Distal 73       11                homogeneous                           +----------+--------+--------+--------+------------------+-----------------+  ICA Prox   30       7        1-39%    heterogenous       atypical waveform  +----------+--------+--------+--------+------------------+-----------------+  ICA Mid    71       14                                   atypical waveform  +----------+--------+--------+--------+------------------+-----------------+  ICA Distal 87       17                                   atypical waveform  +----------+--------+--------+--------+------------------+-----------------+  ECA        349      31       >50%     heterogenous                           +----------+--------+--------+--------+------------------+-----------------+ +----------+--------+--------+----------------+-------------------+             PSV cm/s EDV cm/s Describe         Arm Pressure (mmHG)  +----------+--------+--------+----------------+-------------------+  Subclavian 150               Multiphasic, WNL                      +----------+--------+--------+----------------+-------------------+ +---------+--------+--+--------+--+---------+  Vertebral PSV cm/s 53 EDV cm/s 16 Antegrade  +---------+--------+--+--------+--+---------+  Summary: Right Carotid: Velocities in the  right ICA are consistent with a 1-39% stenosis. Left Carotid: Velocities in the left ICA are consistent with a 1-39% stenosis.               Dampened, high resistant ICA waveforms suggestive of a more distal               obstruction.                Left ECA velocities consistent with >50% stenosis.  *See table(s) above for measurements and observations.  Electronically signed by Gretta Began MD on 03/06/2019 at 6:25:38 PM.    Final       LOS: 3 days   Time spent: More than 50% of that time was spent in counseling and/or coordination of care.  Lanae Boast, MD [facility]  03/08/2019, 7:54 AM

## 2019-03-08 NOTE — Progress Notes (Signed)
Pt transported off unit to cath lab for a procedure. P. Amo Fontaine Hehl RN. 

## 2019-03-08 NOTE — Progress Notes (Signed)
Physical Therapy Treatment Patient Details Name: Douglas [name] MRN: [id number] DOB: 11-Sep-1950 Today's Date: 03/08/2019    History of Present Illness  Douglas [name] is a 68 y.o. 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 MRI showing 1 cm acute L corona radiata, chronic L MCA & PCA.     PT Comments    Session focused on transfer out of bed to recliner for progression of mobility goals. Pt requiring two person maximal assist to pull up with left upper extremity to [facility]. HR 89, BP 144/100 post mobility. Pt daughter presents and states pt is weaker currently than at baseline, requiring increased level of assist. D/c plan remains appropriate.     Follow Up Recommendations  SNF;Supervision/Assistance - 24 hour     Equipment Recommendations  Other (comment)(TBA)    Recommendations for Other Services       Precautions / Restrictions Precautions Precautions: Fall;Other (comment) Precaution Comments: R hemiparesis, increased tone Restrictions Weight Bearing Restrictions: No    Mobility  Bed Mobility Overal bed mobility: Needs Assistance Bed Mobility: Rolling;Sidelying to Sit     Supine to sit: Mod assist     General bed mobility comments: Assist for RLE negotiation off of bed, pulling up to sit with LUE  Transfers Overall transfer level: Needs assistance Equipment used: None Transfers: Sit to/from Stand Sit to Stand: Max assist;+2 physical assistance         General transfer comment: MaxA + 2 to pull up to stand with LUE to [place]. Pt with varying levels of initiation, needing max multimodal cues for participation  Ambulation/Gait             General Gait Details: unable at baseline   Stairs             Wheelchair Mobility    Modified Rankin (Stroke Patients Only) Modified Rankin (Stroke Patients  Only) Pre-Morbid Rankin Score: Severe disability Modified Rankin: Severe disability     Balance Overall balance assessment: Needs assistance Sitting-balance support: Feet supported;Single extremity supported Sitting balance-Leahy Scale: Poor Sitting balance - Comments: Reliant on left arm support, close supervision for safety                                    Cognition Arousal/Alertness: Awake/alert Behavior During Therapy: WFL for tasks assessed/performed Overall Cognitive Status: Difficult to assess Area of Impairment: Following commands;Attention                   Current Attention Level: Sustained   Following Commands: Follows one step commands inconsistently       General Comments: Following <50% of simple, one step commands with multimodal cueing. Has global aphasia and only able to respond "yes," and "hey," during session. Unable to communicate basic needs.       Exercises      General Comments        Pertinent Vitals/Pain Pain Assessment: Faces Faces Pain Scale: No hurt    Home Living                      Prior Function            PT Goals (current goals can now be found in the care plan section) Acute Rehab PT Goals Patient Stated Goal: unable PT Goal  Formulation: Patient unable to participate in goal setting Time For Goal Achievement: 03/20/19 Potential to Achieve Goals: Fair Progress towards PT goals: Progressing toward goals    Frequency    Min 3X/week      PT Plan Current plan remains appropriate    Co-evaluation              AM-PAC PT "6 Clicks" Mobility   Outcome Measure  Help needed turning from your back to your side while in a flat bed without using bedrails?: A Lot Help needed moving from lying on your back to sitting on the side of a flat bed without using bedrails?: A Lot Help needed moving to and from a bed to a chair (including a wheelchair)?: Total Help needed standing up from a chair  using your arms (e.g., wheelchair or bedside chair)?: Total Help needed to walk in hospital room?: Total Help needed climbing 3-5 steps with a railing? : Total 6 Click Score: 8    End of Session Equipment Utilized During Treatment: Gait belt Activity Tolerance: Patient tolerated treatment well Patient left: with call bell/phone within reach;in chair;with chair alarm set Nurse Communication: Need for lift equipment PT Visit Diagnosis: Other abnormalities of gait and mobility (R26.89);Hemiplegia and hemiparesis Hemiplegia - Right/Left: Right Hemiplegia - dominant/non-dominant: Dominant Hemiplegia - caused by: Cerebral infarction     Time: 1134-1207 PT Time Calculation (min) (ACUTE ONLY): 33 min  Charges:  $Therapeutic Activity: 23-37 mins                     Douglas [name], PT, DPT Acute Rehabilitation Services Pager [phone] Office [phone]    Douglas [name] 03/08/2019, 1:26 PM

## 2019-03-09 LAB — BASIC METABOLIC PANEL
Anion gap: 9 (ref 5–15)
BUN: 13 mg/dL (ref 8–23)
CO2: 26 mmol/L (ref 22–32)
Calcium: 9 mg/dL (ref 8.9–10.3)
Chloride: 105 mmol/L (ref 98–111)
Creatinine, Ser: 1.03 mg/dL (ref 0.61–1.24)
GFR calc Af Amer: >60 mL/min (ref >60)
GFR calc non Af Amer: >60 mL/min (ref >60)
Glucose, Bld: 224 mg/dL — ABNORMAL HIGH (ref 70–99)
Potassium: 3.7 mmol/L (ref 3.5–5.1)
Sodium: 140 mmol/L (ref 135–145)

## 2019-03-09 LAB — CBC
HCT: 36.4 % — ABNORMAL LOW (ref 39.0–52.0)
Hemoglobin: 12.1 g/dL — ABNORMAL LOW (ref 13.0–17.0)
MCH: 30.3 pg (ref 26.0–34.0)
MCHC: 33.2 g/dL (ref 30.0–36.0)
MCV: 91 fL (ref 80.0–100.0)
Platelets: 215 10*3/uL (ref 150–400)
RBC: 4 MIL/uL — ABNORMAL LOW (ref 4.22–5.81)
RDW: 12.8 % (ref 11.5–15.5)
WBC: 5.1 10*3/uL (ref 4.0–10.5)
nRBC: 0 % (ref 0.0–0.2)

## 2019-03-09 LAB — GLUCOSE, CAPILLARY
Glucose-Capillary: 239 mg/dL — ABNORMAL HIGH (ref 70–99)
Glucose-Capillary: 170 mg/dL — ABNORMAL HIGH (ref 70–99)
Glucose-Capillary: 228 mg/dL — ABNORMAL HIGH (ref 70–99)
Glucose-Capillary: 185 mg/dL — ABNORMAL HIGH (ref 70–99)
Glucose-Capillary: 179 mg/dL — ABNORMAL HIGH (ref 70–99)
Glucose-Capillary: 158 mg/dL — ABNORMAL HIGH (ref 70–99)
Glucose-Capillary: 208 mg/dL — ABNORMAL HIGH (ref 70–99)

## 2019-03-09 NOTE — Progress Notes (Signed)
Hospitalist progress note  If 7PM-7AM,  night-coverage-look on AMION -prefer pages-not epic chat,please  Douglas [name] [id number] DOB: July 01, 1950 DOA: 11/3/2020PCP: Patient, No Pcp Per   Narrative:  68 year old community living male CVA 2013, last 1 04/04/2018 status post TPA + right-sided residual paresis/aphasia status post lengthy rehab stay until 05/2018,  HTN,  DM TY 2-  recent Rx urinary tract infection on Keflex CTA head = extension left PCA territory infarct + thalamus-MRI brain =1 cm acute left corona radiata infarct  Neurology felt also could be secondary to seizure episode patient placed on Lamictal, Keppra 500 twice daily Cardiology consulted for loop recorder   admit from home patient not acting himself 11/3 episodic urinary incontinence Assessment & Plan: CVA in the setting of multiple ones in 2013 and 2019-this time cryptogenic small left corona radiata infarct large left L MCA L PCA and multiple chronic lacune's Linq recorder Medtronic placed by Dr. Curt Bears 11/6 Should continue aspirin 325 Plavix 75 per stroke team-long-term disposition SNF  Dysphagia secondary to stroke Speech therapy recommending dysphagia 3 nectar thick  Probable seizure Continuing Lamictal 100 twice daily, Keppra 500 twice daily and monitor for further seizure activity  HTN Allow permissive hypertension for goal of 130/80 long-term Lisinopril 20 twice daily held metoprolol 50 a.m. 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 Home meds Levemir 25 units sliding scale in addition to Glucotrol 10 twice daily Moderate control only CBGs ranging 1 85-2 28 Would only however use sliding scale as is not eating apparently  Spoke with daughter and updated, lovenox, full code,  dispo to SNF when able to dispo patient    Subjective:  awake coherent  eating-coughed immediately on swallowing liquids Difficulty with communicating with patient  nursing reports ate full meal  Consultants:   Neurology  Cardiology Procedures:   Loop recorder placed 11/6 Antimicrobials:   None currently Objective: Vitals:   03/08/19 1824 03/08/19 2002 03/09/19 0014 03/09/19 0405  BP: (!) 155/75 (!) 153/85 (!) 142/80 139/84  Pulse: 87 91 92 82  Resp: 18 17 17 17   Temp: 93.7 F (37.1 C) 98 F (36.7 C) 97.9 F (36.6 C) (!) 97.5 F (36.4 C)  TempSrc: Oral Oral Oral Oral  SpO2: 100% 100% 99% 99%  Weight:      Height:        Intake/Output Summary (Last 24 hours) at 03/09/2019 0743 Last data filed at 03/09/2019 0500 Gross per 24 hour  Intake 340 ml  Output 850 ml  Net -510 ml   Filed Weights   03/05/19 1236 03/05/19 2330  Weight: 82.6 kg 82.6 kg    Examination: Very dense R sided motor deficit-tries to verbalize can follow commands Tracks with eyes to some extent s1 s2 no m/r/g Abd soft no nd Dense motor defciit RLE  Data Reviewed: I have personally reviewed following labs and imaging studies  Scheduled Meds: .  stroke: mapping our early stages of recovery book   Does not apply Once  . amLODipine  5 mg Oral Daily  . aspirin  325 mg Oral Daily  . atorvastatin  40 mg Oral q1800  . baclofen  20 mg Oral BID  . clopidogrel  75 mg Oral Daily  . enoxaparin (LOVENOX) injection  40 mg Subcutaneous Q24H  . insulin aspart  0-15 Units Subcutaneous TID WC  .  insulin aspart  0-5 Units Subcutaneous QHS  . lamoTRIgine  100 mg Oral BID  . levETIRAcetam  500 mg Oral BID  . pantoprazole  40 mg Oral Daily  . tamsulosin  0.4 mg Oral QPC supper   Continuous Infusions: . cefTRIAXone (ROCEPHIN)  IV Stopped (03/08/19 1444)    Radiology Studies: Reviewed images personally in health database    LOS: 4 days   Time spent: 30 Pleas Koch, MD [facility]  03/09/2019, 7:43 AM

## 2019-03-09 NOTE — Significant Event (Addendum)
Rapid Response Event Note  Overview:Called to help with bleeding from loop recorder insertion site. Loop recorder was placed 11/6. Per RN, pt had gotten agitated and was moving L arm back and forth forcefully. RN noted at that time that site had begun to bleed. Time Called: 2325 Arrival Time: 2328 Event Type: Other (Comment)(Bleeding from loop recorder insertion site)  Initial Focused Assessment: Pt laying in bed with eyes opened, confused(baseline). RN holding pressure on L chest loop recorder site. Per RN, pressure had been held approximately 10 minutes. Site uncovered and assessed. Original dressing-gauze and tegaderm-saturated in blood. Dressing taken off and site cleaned with CHG. Site still oozing small amount of blood around intact steristrips. Gauze pressure dressing applied to site. HR-77, BP-200/100, RR-17, SpO2-100% on RA, hbg-12.1.  Interventions: Pressure held x 10 minutes PTA RRT. Pressure dressing applied to site Plan of Care (if not transferred): Continue to monitor site for bleeding. If no bleeding in the AM, may try to place new gauze/tegaderm dressing back to site. Call RRT if further assistance needed. Event Summary:  Outcome: Stayed in room and stabilized  Event End Time: 0000  Dillard Essex

## 2019-03-09 NOTE — TOC Progression Note (Signed)
Transition of Care [facility]) - Progression Note    Patient Details  Name: Nyshawn Gowdy MRN: [id number] Date of Birth: [birth year]  Transition of Care [facility]) CM/SW [place], [place] Phone Number: 03/09/2019, 9:04 AM  Clinical Narrative:   Per MD, patient's daughter would prefer [place]. [place] referral remains pending. CSW reached out to [facility] to ask about bed availability, and they have no beds available at this time. CSW to provide bed offers to patient's daughter for choice.    Expected Discharge Plan: [place] Barriers to Discharge: Continued Medical Work up  Expected Discharge Plan and Services Expected Discharge Plan: [place] In-house Referral: Clinical Social Work Discharge Planning Services: CM Consult Post Acute Care Choice: [place] arrangements for the past 2 months: Single Family Home                                       Social Determinants of Health (SDOH) Interventions    Readmission Risk Interventions No flowsheet data found.

## 2019-03-10 LAB — GLUCOSE, CAPILLARY
Glucose-Capillary: 173 mg/dL — ABNORMAL HIGH (ref 70–99)
Glucose-Capillary: 194 mg/dL — ABNORMAL HIGH (ref 70–99)
Glucose-Capillary: 235 mg/dL — ABNORMAL HIGH (ref 70–99)
Glucose-Capillary: 194 mg/dL — ABNORMAL HIGH (ref 70–99)

## 2019-03-10 LAB — CBC
HCT: 38.8 % — ABNORMAL LOW (ref 39.0–52.0)
Hemoglobin: 12.8 g/dL — ABNORMAL LOW (ref 13.0–17.0)
MCH: 30.2 pg (ref 26.0–34.0)
MCHC: 33 g/dL (ref 30.0–36.0)
MCV: 91.5 fL (ref 80.0–100.0)
Platelets: 191 10*3/uL (ref 150–400)
RBC: 4.24 MIL/uL (ref 4.22–5.81)
RDW: 12.6 % (ref 11.5–15.5)
WBC: 5.5 10*3/uL (ref 4.0–10.5)
nRBC: 0 % (ref 0.0–0.2)

## 2019-03-10 LAB — CULTURE, BLOOD (ROUTINE X 2)
Culture: NO GROWTH
Culture: NO GROWTH
Special Requests: ADEQUATE

## 2019-03-10 LAB — BASIC METABOLIC PANEL
Anion gap: 9 (ref 5–15)
BUN: 11 mg/dL (ref 8–23)
CO2: 26 mmol/L (ref 22–32)
Calcium: 8.9 mg/dL (ref 8.9–10.3)
Chloride: 102 mmol/L (ref 98–111)
Creatinine, Ser: 0.86 mg/dL (ref 0.61–1.24)
GFR calc Af Amer: >60 mL/min (ref >60)
GFR calc non Af Amer: >60 mL/min (ref >60)
Glucose, Bld: 240 mg/dL — ABNORMAL HIGH (ref 70–99)
Potassium: 3.8 mmol/L (ref 3.5–5.1)
Sodium: 137 mmol/L (ref 135–145)

## 2019-03-10 LAB — PROTIME-INR
INR: 1.1 (ref 0.8–1.2)
Prothrombin Time: 13.7 seconds (ref 11.4–15.2)

## 2019-03-10 MED ORDER — AMLODIPINE BESYLATE 10 MG PO TABS
10.0000 mg | ORAL_TABLET | Freq: Every day | ORAL | Status: DC
  Administered 2019-03-10 – 2019-03-12 (×3): 10 mg via ORAL
  Filled 2019-03-10 (×3): qty 1

## 2019-03-10 MED ORDER — "THROMBI-PAD 3""X3"" EX PADS"
1.0000 | MEDICATED_PAD | Freq: Once | CUTANEOUS | Status: AC
  Administered 2019-03-10: 1 via TOPICAL
  Filled 2019-03-10: qty 1

## 2019-03-10 NOTE — Progress Notes (Signed)
Called Rapid Response advised the patient had soaked through the dressing. Rapid Response came to the bedside, physician, Bodenheimer notified. Pressure was applied, bleeding stopped, clean pressure dressing was applied and physician advised he would order Thrombi-Pad and notify EP in the am.

## 2019-03-10 NOTE — Progress Notes (Signed)
Called Rapid Response, advised the dressing was still clean dry and intact on the surface but you could see that the site was still bleeding. The drainage had not soaked through the dressing at that point. The nurse reinforced the dressing and was advised to continue to monitor.

## 2019-03-10 NOTE — Progress Notes (Signed)
Thrombi-Pad applied under pressure dressing.

## 2019-03-10 NOTE — Progress Notes (Signed)
Called for loop recorder site oozing.  On exam, ongoing active oozing noted.  I held direct pressure for 10 minutes with resolution.  New dressing placed.  Will have EP APP return to see in am  Thompson Grayer MD, [place] 03/10/2019 10:49 AM

## 2019-03-10 NOTE — Significant Event (Signed)
Rapid Response Event Note  Overview:Loop recorder insertion site saturated again. BP ok. Time Called: 0300 Arrival Time: 0302 Event Type: Other (Comment)(bleeding from loop recorder site)  Interventions: Pressure held x 10 min and hemostasis achieved.  Pressure dressing with thrombi pad placed. PT/INR ordered Plan of Care (if not transferred): Continue to monitor dressing. Call RRT if further assistance needed. Event Summary: Name of Physician Notified: Bodenheimer, NP at [phone]    at    Outcome: Stayed in room and stabalized  Event End Time:   Dillard Essex

## 2019-03-10 NOTE — Progress Notes (Signed)
Site was assessed and a pressure dressing was applied. Nurse advised by Rapid Response nurse to monitor pressure dressing for additional bleeding and call if the dressing becomes saturated.

## 2019-03-10 NOTE — Progress Notes (Signed)
Dressing is blood stained.  Removed and direct pressure applied onto the oozing site and held for 10 minutes.  Petroleum occusive dressing apply with gauze and new dressing placed.  Will continue monitor.

## 2019-03-10 NOTE — Progress Notes (Signed)
Hospitalist progress note  If 7PM-7AM,  night-coverage-look on AMION -prefer pages-not epic chat,please  Douglas [name] [id number] DOB: 02-12-1951 DOA: 11/3/2020PCP: Patient, No Pcp Per   Narrative:  68 year old community living male CVA 2013, last 1 04/04/2018 status post TPA + right-sided residual paresis/aphasia status post lengthy rehab stay until 05/2018,  HTN,  DM TY 2-  recent Rx urinary tract infection on Keflex CTA head = extension left PCA territory infarct + thalamus-MRI brain =1 cm acute left corona radiata infarct  Neurology felt also could be secondary to seizure episode patient placed on Lamictal, Keppra 500 twice daily Cardiology consulted for loop recorder   admit from home patient not acting himself 11/3 episodic urinary incontinence  Assessment & Plan: CVA in the setting of multiple ones in 2013 and 2019-this time cryptogenic small left corona radiata infarct large left L MCA L PCA and multiple chronic lacune's Linq recorder Medtronic placed by Dr. Elberta Fortis 11/6 Should continue aspirin 325 Plavix 75 per stroke team-long-term disposition SNF  Bleeding from superficial Linq recorder ICD implant 11/8 AM Seems minimal thrombin pad applied- Cardiologist notified of bleeding and requested to assess if anything needs to be done  Dysphagia secondary to stroke Speech therapy recommending dysphagia 3 nectar thick fluids  Food is untouched today  Probable seizure Continuing Lamictal 100 twice daily, Keppra 500 twice daily and monitor for further seizure activity  HTN Allow permissive hypertension for goal of 130/80 long-term Lisinopril 20 2 times per day, metoprolol held, hydralazine held-- hold clonidine Long term amlodipine 5 increased to 10  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 Home meds Levemir 25 units sliding scale in addition to Glucotrol 10 twice daily CBG 2 24-2 40  Spoke with daughter and updated, lovenox, full code,  dispo to SNF when able to dispo patient     Subjective:  Events noted overnight with some bleeding-thrombin pad placed over Linq access Patient himself is minimally interactive although is awake he nods and has some episodes of echolalia He is able to follow commands to some degree but his speech is still severely impaired His breakfast tray remains untouched  Consultants:   Neurology  Cardiology Procedures:   Loop recorder placed 11/6 Antimicrobials:   None currently Objective: Vitals:   03/09/19 1952 03/09/19 2327 03/10/19 0304 03/10/19 0403  BP: (!) 169/89 (!) 200/100 (!) 165/83 139/87  Pulse: 81  75 75  Resp: 17  16 19   Temp: 97.7 F (36.5 C)   98.3 F (36.8 C)  TempSrc: Oral   Oral  SpO2: 100%  100% 100%  Weight:      Height:        Intake/Output Summary (Last 24 hours) at 03/10/2019 13/12/2018 Last data filed at 03/09/2019 1818 Gross per 24 hour  Intake 100 ml  Output -  Net 100 ml   Filed Weights   03/05/19 1236 03/05/19 2330  Weight: 82.6 kg 82.6 kg    Examination:  dense R sided motor deficit-tries to verbalize can follow commands Chest has reinforced bandage across the area of the loop recorder with no apparent seepage I did not examine below Tracks with eyes to some extent s1 s2 no m/r/g Abd soft no nd Dense motor defciit RLE  Data Reviewed: I have personally reviewed following labs and imaging studies  Scheduled Meds: .  stroke: mapping our early stages of recovery book   Does not apply Once  . amLODipine  5 mg Oral Daily  . aspirin  325 mg Oral  Daily  . atorvastatin  40 mg Oral q1800  . baclofen  20 mg Oral BID  . clopidogrel  75 mg Oral Daily  . enoxaparin (LOVENOX) injection  40 mg Subcutaneous Q24H  . insulin aspart  0-15 Units Subcutaneous TID WC  . insulin aspart  0-5 Units Subcutaneous QHS  . lamoTRIgine  100 mg Oral BID  . levETIRAcetam  500 mg Oral BID  . pantoprazole  40 mg Oral Daily  . tamsulosin  0.4 mg Oral QPC supper   Continuous Infusions: . cefTRIAXone (ROCEPHIN)   IV Stopped (03/09/19 1606)    Radiology Studies: Reviewed images personally in health database    LOS: 5 days   Time spent: [place], MD [facility]  03/10/2019, 8:24 AM

## 2019-03-10 NOTE — Progress Notes (Signed)
Patient became quite irritated when trying to communicate. He began shaking his left arm back and forth very vigorously. Nurse tried to calm the patient. Patient began bleeding from loop recorder site. Nurse put pressure on the site, called charge and Rapid.

## 2019-03-11 ENCOUNTER — Inpatient Hospital Stay (HOSPITAL_COMMUNITY): Payer: Medicare Other

## 2019-03-11 ENCOUNTER — Encounter (HOSPITAL_COMMUNITY): Payer: Self-pay | Admitting: Cardiology

## 2019-03-11 LAB — BASIC METABOLIC PANEL
Anion gap: 10 (ref 5–15)
BUN: 11 mg/dL (ref 8–23)
CO2: 26 mmol/L (ref 22–32)
Calcium: 9 mg/dL (ref 8.9–10.3)
Chloride: 101 mmol/L (ref 98–111)
Creatinine, Ser: 1 mg/dL (ref 0.61–1.24)
GFR calc Af Amer: >60 mL/min (ref >60)
GFR calc non Af Amer: >60 mL/min (ref >60)
Glucose, Bld: 221 mg/dL — ABNORMAL HIGH (ref 70–99)
Potassium: 4.1 mmol/L (ref 3.5–5.1)
Sodium: 137 mmol/L (ref 135–145)

## 2019-03-11 LAB — GLUCOSE, CAPILLARY
Glucose-Capillary: 212 mg/dL — ABNORMAL HIGH (ref 70–99)
Glucose-Capillary: 231 mg/dL — ABNORMAL HIGH (ref 70–99)
Glucose-Capillary: 207 mg/dL — ABNORMAL HIGH (ref 70–99)
Glucose-Capillary: 150 mg/dL — ABNORMAL HIGH (ref 70–99)

## 2019-03-11 LAB — CBC
HCT: 36.6 % — ABNORMAL LOW (ref 39.0–52.0)
Hemoglobin: 12.1 g/dL — ABNORMAL LOW (ref 13.0–17.0)
MCH: 30.1 pg (ref 26.0–34.0)
MCHC: 33.1 g/dL (ref 30.0–36.0)
MCV: 91 fL (ref 80.0–100.0)
Platelets: 194 10*3/uL (ref 150–400)
RBC: 4.02 MIL/uL — ABNORMAL LOW (ref 4.22–5.81)
RDW: 12.7 % (ref 11.5–15.5)
WBC: 4.9 10*3/uL (ref 4.0–10.5)
nRBC: 0 % (ref 0.0–0.2)

## 2019-03-11 LAB — SARS CORONAVIRUS 2 (TAT 6-24 HRS): SARS Coronavirus 2: NEGATIVE

## 2019-03-11 MED ORDER — LISINOPRIL 20 MG PO TABS
20.0000 mg | ORAL_TABLET | Freq: Every day | ORAL | Status: DC
  Administered 2019-03-11 – 2019-03-12 (×2): 20 mg via ORAL
  Filled 2019-03-11 (×2): qty 1

## 2019-03-11 MED ORDER — METOPROLOL SUCCINATE ER 25 MG PO TB24
25.0000 mg | ORAL_TABLET | Freq: Every day | ORAL | Status: DC
  Administered 2019-03-11: 25 mg via ORAL
  Filled 2019-03-11: qty 1

## 2019-03-11 NOTE — Care Management Important Message (Signed)
Important Message  Patient Details  Name: Douglas [name] MRN: [id number] Date of Birth: January 28, 1951   Medicare Important Message Given:  Yes     Orbie Pyo 03/11/2019, 2:58 PM

## 2019-03-11 NOTE — Progress Notes (Signed)
Loop implant site evaluated Looks like last time pressure was held/dressing changes was yesterday 11:35AM The site is stable this AM verry small area of dried blood to lower edge of dressing, approx 77mm diameter circle No active bleeding or hematoma  Recommend if no further bleeding/site issues to leave current dressing in place, remove in 3 days   Tommye Standard, PA-C

## 2019-03-11 NOTE — Progress Notes (Signed)
Hospitalist progress note  If 7PM-7AM,  night-coverage-look on AMION -prefer pages-not epic chat,please  Douglas [name] [id number] DOB: 25-Feb-1951 DOA: 11/3/2020PCP: Douglas [name], No Pcp Per   Narrative:  68 year old community living male CVA 2013, last 1 04/04/2018 status post TPA + right-sided residual paresis/aphasia status post lengthy rehab stay until 05/2018,  HTN,  DM TY 2-  recent Rx urinary tract infection on Keflex  admit from home Douglas [name] not acting himself 11/3 episodic urinary incontinence CTA head = extension left PCA territory infarct + thalamus-MRI brain =1 cm acute left corona radiata infarct  Neurology felt also could be secondary to seizure episode Douglas [name] placed on Lamictal, Keppra 500 twice daily Cardiology consulted for loop recorder     Assessment & Plan: CVA in the setting of multiple ones in 2013 and 2019-this time cryptogenic small left corona radiata infarct large left L MCA L PCA and multiple chronic lacune's Linq recorder Medtronic placed by Dr. Curt Bears 11/6 Should continue aspirin 325 Plavix 75 long term and as OP determine if to change dosing per Dr. Erlinda Hong -long-term disposition SNF--Await Covid test and likely d/c when able  Bleeding from superficial Linq recorder ICD implant 11/8 AM Seems minimal thrombin pad applied- Cardiologist notified -final recs are to keep dressing in place for 3 days then remove on 11/12  Dysphagia secondary to stroke Speech therapy recommending dysphagia 3 nectar thick fluids after DG swallow  Eating some today once set up  Probable seizure Continuing Lamictal 100 twice daily, Keppra 500 twice daily and monitor for further seizure activity  HTN Allow permissive hypertension for goal of 130/80 Resume Lisinopril 20 2 times per day-->20 qd metoprolol 25 xl resumed amlodipine 5 increased to 10 Hydralazine d/c this admit  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  Home meds Levemir 25 units --added lback at lower dose 12 U increase as  tol sliding scale in addition to Glucotrol 10 twice daily CBG 212-221  lovenox, full code,  dispo to SNF when able to dispo Douglas [name] --likely in am   Subjective:  Awake seems uncomfortable Nods and says "yes" in response to my question if he is having pain frustrated as Douglas [name] unable to verbalize further what he want sto say He was re-positioned as it seems he was in some pain in sacrum  Consultants:   Neurology  Cardiology Procedures:   Loop recorder placed 11/6 Antimicrobials:   None currently Objective: Vitals:   03/10/19 1941 03/10/19 2357 03/11/19 0353 03/11/19 0739  BP: (!) 147/91 (!) 142/82 (!) 146/86 (!) 162/84  Pulse: 88 80 71 81  Resp: 17 17 17 15   Temp: 97.8 F (36.6 C) 97.8 F (36.6 C) 97.9 F (36.6 C) 98.5 F (36.9 C)  TempSrc: Oral Oral Oral Oral  SpO2: 100% 100% 100% 100%  Weight:      Height:        Intake/Output Summary (Last 24 hours) at 03/11/2019 1023 Last data filed at 03/11/2019 0932 Gross per 24 hour  Intake 916 ml  Output 300 ml  Net 616 ml   Filed Weights   03/05/19 1236 03/05/19 2330  Weight: 82.6 kg 82.6 kg    Examination:  eomi dense hemiparesis on R side Drooping of mouth on that side as well cta b no added sound  abd soft nt nd no rebound Sacrum appears witout decubitus   Data Reviewed: I have personally reviewed following labs and imaging studies  Bun creat 11//1.0 Hemoglobin in 12 range and stable   Scheduled Meds: .  stroke: mapping our early stages of recovery book   Does not apply Once  . amLODipine  10 mg Oral Daily  . aspirin  325 mg Oral Daily  . atorvastatin  40 mg Oral q1800  . baclofen  20 mg Oral BID  . clopidogrel  75 mg Oral Daily  . enoxaparin (LOVENOX) injection  40 mg Subcutaneous Q24H  . insulin aspart  0-15 Units Subcutaneous TID WC  . insulin aspart  0-5 Units Subcutaneous QHS  . lamoTRIgine  100 mg Oral BID  . levETIRAcetam  500 mg Oral BID  . lisinopril  20 mg Oral Daily  . metoprolol  succinate  25 mg Oral QHS  . pantoprazole  40 mg Oral Daily  . tamsulosin  0.4 mg Oral QPC supper   Continuous Infusions: . cefTRIAXone (ROCEPHIN)  IV Stopped (03/11/19 0047)    Radiology Studies: Reviewed images personally in health database    LOS: 6 days   Time spent: 30 Pleas Koch, MD [facility]  03/11/2019, 10:23 AM

## 2019-03-11 NOTE — Progress Notes (Signed)
Physical Therapy Treatment Patient Details Name: Douglas [name] MRN: [id number] DOB: 1951-02-02 Today's Date: 03/11/2019    History of Present Illness  [name] is a 68 y.o. 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 MRI showing 1 cm acute L corona radiata, chronic L MCA & PCA.     PT Comments    Pt sat EOB with +2 mod A and maintained sitting EOB >10 mins for clean up of LE's and feet from urination in bed. Occasional L LOB in sitting with dynamic movement. Transferred into chair on L with max A +2 using lateral scoot transfer. Lift pad left under pt for return to bed. PT will continue to follow. VSS    Follow Up Recommendations  SNF;Supervision/Assistance - 24 hour     Equipment Recommendations  Other (comment)(TBA)    Recommendations for Other Services       Precautions / Restrictions Precautions Precautions: Fall;Other (comment) Precaution Comments: R hemiparesis, increased tone Restrictions Weight Bearing Restrictions: No    Mobility  Bed Mobility Overal bed mobility: Needs Assistance Bed Mobility: Rolling;Sidelying to Sit Rolling: Mod assist Sidelying to sit: Mod assist       General bed mobility comments: facilitation given to head to look R and attend to R bed rail to grasp with L hand. Mod A for LE's off bed and elevation if trunk into sitting. Pt attempting to position self EOB but with ineffective R hip scoot. Min A to square hips.   Transfers Overall transfer level: Needs assistance Equipment used: None Transfers: Lateral/Scoot Transfers          Lateral/Scoot Transfers: Max assist;+2 physical assistance General transfer comment: attempted squat pivot first but pt could gain no upward momentum. Scooted laterally to L with pt grasping far chair arm but used LLE minimally. Max A +2 at hips to scoot into  chair  Ambulation/Gait             General Gait Details: unable   Stairs             Wheelchair Mobility    Modified Rankin (Stroke Patients Only) Modified Rankin (Stroke Patients Only) Pre-Morbid Rankin Score: Severe disability Modified Rankin: Severe disability     Balance Overall balance assessment: Needs assistance Sitting-balance support: Feet supported;Single extremity supported Sitting balance-Leahy Scale: Poor Sitting balance - Comments: pt unable to maintain balance when bed not completely level, LOB to L with min A to correct. Once bed completely flat, pt able to maintain static sitting balance.                                     Cognition Arousal/Alertness: Awake/alert Behavior During Therapy: WFL for tasks assessed/performed Overall Cognitive Status: Difficult to assess Area of Impairment: Following commands;Attention;Problem solving                   Current Attention Level: Sustained   Following Commands: Follows one step commands inconsistently Safety/Judgement: Decreased awareness of safety;Decreased awareness of deficits Awareness: Intellectual Problem Solving: Slow processing;Requires verbal cues;Requires tactile cues;Difficulty sequencing General Comments: Following <50% of simple, one step commands with multimodal cueing. Has global aphasia and only able to respond "yes" during session. Unable to communicate basic needs.       Exercises      General Comments  General comments (skin integrity, edema, etc.): pt expressed appreciation in being out of wet bed and in clean, dry gown and socks.       Pertinent Vitals/Pain Pain Assessment: Faces Faces Pain Scale: Hurts a little bit Pain Location: grimacing with passive motion to R hand and elbow Pain Descriptors / Indicators: Grimacing Pain Intervention(s): Repositioned    Home Living                      Prior Function            PT Goals (current  goals can now be found in the care plan section) Acute Rehab PT Goals Patient Stated Goal: unable PT Goal Formulation: Patient unable to participate in goal setting Time For Goal Achievement: 03/20/19 Potential to Achieve Goals: Fair Progress towards PT goals: Progressing toward goals    Frequency    Min 3X/week      PT Plan Current plan remains appropriate    Co-evaluation              AM-PAC PT "6 Clicks" Mobility   Outcome Measure  Help needed turning from your back to your side while in a flat bed without using bedrails?: A Lot Help needed moving from lying on your back to sitting on the side of a flat bed without using bedrails?: A Lot Help needed moving to and from a bed to a chair (including a wheelchair)?: Total Help needed standing up from a chair using your arms (e.g., wheelchair or bedside chair)?: Total Help needed to walk in hospital room?: Total Help needed climbing 3-5 steps with a railing? : Total 6 Click Score: 8    End of Session Equipment Utilized During Treatment: Gait belt Activity Tolerance: Patient tolerated treatment well Patient left: with call bell/phone within reach;in chair;with chair alarm set Nurse Communication: Need for lift equipment PT Visit Diagnosis: Other abnormalities of gait and mobility (R26.89);Hemiplegia and hemiparesis Hemiplegia - Right/Left: Right Hemiplegia - dominant/non-dominant: Dominant Hemiplegia - caused by: Cerebral infarction     Time: 4782-9562 PT Time Calculation (min) (ACUTE ONLY): 25 min  Charges:  $Therapeutic Activity: 23-37 mins                     [place]  Pager [phone] Office [place] 03/11/2019, 1:09 PM

## 2019-03-11 NOTE — TOC Progression Note (Signed)
Transition of Care [facility]) - Progression Note    Patient Details  Name: Douglas [name] MRN: [id number] Date of Birth: [birth year]  Transition of Care [facility] CM/SW [place],  Phone Number: 03/11/2019, 1:43 PM  Clinical Narrative:   CSW confirmed with [facility] that patient would have bed available after COVID test received. CSW asked MD for COVID test. Patient can DC after COVID test results tomorrow.    Expected Discharge Plan: [place] Barriers to Discharge: Continued Medical Work up  Expected Discharge Plan and Services Expected Discharge Plan: [place] In-house Referral: Clinical Social Work Discharge Planning Services: CM Consult Post Acute Care Choice: [place] arrangements for the past 2 months: Single Family Home                                       Social Determinants of Health (SDOH) Interventions    Readmission Risk Interventions No flowsheet data found.

## 2019-03-11 NOTE — Progress Notes (Signed)
  Speech Language Pathology Treatment: Dysphagia  Patient Details Name: Douglas [name] MRN: [id number] DOB: 1950-12-09 Today's Date: 03/11/2019 Time: 3016-0109 SLP Time Calculation (min) (ACUTE ONLY): 8 min  Assessment / Plan / Recommendation Clinical Impression  Pt was encountered awake/alert, sitting upright in bed.  Pt with two instances of verbalizations in this tx session, otherwise pt was nonverbal.  Pt was seen with trials of thin liquid via cup sip and regular solids.  He exhibited good bolus acceptance and timely AP transport with thin liquid trials.  He exhibited mildly prolonged, but effective mastication of regular solids with trace oral residue observed that he was able to clear with a cued liquid wash.  No clinical s/sx of aspiration were observed with any trials.  Due to hx of silent aspiration of thin liquid, plan for MBS later today to objectively evaluate swallow function.  Recommend continuation of Dysphagia 3 (soft) solids and nectar-thick liquid at this time.  SLP will continue to f/u for dysphagia tx.     HPI HPI: Douglas [name] is a 68 y.o. 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 MRI showing 1 cm acute L corona radiata, chronic L MCA & PCA.  Pt was on CIR in December 2019 following his left MCA and ACA CVA and was on a dys2/nectar diet upon dc and was working on communicating basic needs via multimodal communication.  Pt failed [facility] as she stopped drinking water.      SLP Plan  MBS;Continue with current plan of care       Recommendations  Diet recommendations: Dysphagia 3 (mechanical soft);Nectar-thick liquid Liquids provided via: Cup Medication Administration: Whole meds with puree Supervision: Intermittent supervision to cue for compensatory strategies;Patient able to self feed Compensations: Slow rate;Small  sips/bites Postural Changes and/or Swallow Maneuvers: Seated upright 90 degrees                Oral Care Recommendations: Staff/trained caregiver to provide oral care;Oral care QID Follow up Recommendations: 24 hour supervision/assistance SLP Visit Diagnosis: Dysphagia, unspecified (R13.10) Plan: MBS;Continue with current plan of care       Colin Mulders M.S., [place] Office: [phone]               [place] 03/11/2019, 9:09 AM

## 2019-03-11 NOTE — Plan of Care (Signed)
Patient stable, discussed POC with patient's daughter at bedside, agreeable with plan, denies question/concerns at this time.   Problem: Clinical Measurements: Goal: Cardiovascular complication will be avoided Outcome: Progressing

## 2019-03-11 NOTE — Progress Notes (Signed)
Modified Barium Swallow Progress Note  Patient Details  Name: Amaru Burroughs MRN: [id number] Date of Birth: 1950-11-28  Today's Date: 03/11/2019  Modified Barium Swallow completed.  Full report located under Chart Review in the Imaging Section.  Brief recommendations include the following:  Clinical Impression  Pt presents with mild oropharyngeal dysphagia with resultant trace. sensed aspiration of thin liquid via straw sip on today's evaluation.  No laryngeal penetration or aspiration was observed with thin liquid via tsp/cup sip, nectar-thick liquid, puree, or regular solids.  Oral phase was remarkable for prolonged mastication, reduced bolus cohesion, and reduced lingual control resulting in premature spillage to the pharynx.  Pharyngeal phase was remarkable for reduced laryngeal closure with thin liquid via straw sip, resulting in aspiration during the swallow.  Aspiration was sensed and pt was able to effectively clear aspirated material with a spontaneous cough.  Pharyngoesophageal phase was unremarkable.  Swallow initiation was at the level of the pyriform sinuses for all consistencies except for puree which occurred over the tip of the epiglottis.  Recommend Dysphagia 3 (mech soft) solids and thin liquid with the following precautions: 1) NO STRAWS 2) Single sips 3) Small bites/sips 4) Slow rate of intake 5) Sit upright 90 degrees.     Swallow Evaluation Recommendations       SLP Diet Recommendations: Thin liquid;Dysphagia 3 (Mech soft) solids   Liquid Administration via: No straw;Cup   Medication Administration: Whole meds with puree   Supervision: Staff to assist with self feeding;Full supervision/cueing for compensatory strategies   Compensations: Slow rate;Small sips/bites   Postural Changes: Seated upright at 90 degrees   Oral Care Recommendations: Oral care BID;Staff/trained caregiver to provide oral care       Colin Mulders M.S., [place] Office: [phone]  Elvia Collum Cosme Jacob 03/11/2019,10:14 AM

## 2019-03-11 NOTE — Progress Notes (Addendum)
Called for recurrent oozing at loop site.   D/w RN, had been fine all day until OOB to chair and started oozing again, she applied pressure and made me aware Dressing nearly saturated upon my arrival, very small area of hematoma Pressure held and hematma resolved Pt tolerated well New steri strips applied (given prior were saturated and loose) No active bleeding after 10 minutes of pressure New dressing applied, bulky dressing applied to provide some pressure to site. Attending has held lovenox, much appreciated. I will revisit tomorrow AM and re-evaluate site  RN will continue to monitor site  Tommye Standard, [place]

## 2019-03-12 LAB — GLUCOSE, CAPILLARY
Glucose-Capillary: 184 mg/dL — ABNORMAL HIGH (ref 70–99)
Glucose-Capillary: 240 mg/dL — ABNORMAL HIGH (ref 70–99)

## 2019-03-12 MED ORDER — INSULIN DETEMIR 100 UNIT/ML ~~LOC~~ SOLN: 16.0000 [IU] | Freq: Every day | SUBCUTANEOUS | Status: DC

## 2019-03-12 MED ORDER — LEVETIRACETAM 500 MG PO TABS: 500.0000 mg | ORAL_TABLET | Freq: Two times a day (BID) | ORAL | Status: DC

## 2019-03-12 MED ORDER — ATORVASTATIN CALCIUM 40 MG PO TABS: 40.0000 mg | ORAL_TABLET | Freq: Every day | ORAL | Status: DC

## 2019-03-12 NOTE — Progress Notes (Signed)
Patient seen and examined. Pressure dressing removed. Steri strips intact without issues. No further bleeding. EP to sign off with usual LINQ monitor follow up.   Allegra Lai, MD

## 2019-03-12 NOTE — Progress Notes (Signed)
Occupational Therapy Treatment Patient Details Name: Douglas CordiaWilliam L Mendez Jr MRN: [id number] DOB: 1951-01-10 Today's Date: 03/12/2019    History of present illness  Douglas CordiaWilliam L Whitby Jr is a 68 y.o. 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 MRI showing 1 cm acute L corona radiata, chronic L MCA & PCA.    OT comments  Pt received in bed. Currently requires modA+2-maxA+2 to progress to EOB. Sitting EOB pt appeared anxious and fearful, immediately reaching for foot board requiring maxA to let go. After sitting EOB for about 2 min, pt appeared calmer and required minguard-minA for stability. Pt required maxA+2 for lateral scoot toward from EOB to recliner. Therapist completed scapular mobilization in preparation for self-feeding, he requires modA for self-feeding with full supervision per SLP. Pt laughs spontaneously and inappropriately, presenting like someone with pseudobulbar affect. Pt will continue to benefit from skilled OT services to maximize safety and independence with ADL/IADL and functional mobility. Will continue to follow acutely and progress as tolerated.    Follow Up Recommendations  SNF;Supervision/Assistance - 24 hour    Equipment Recommendations  Other (comment)(defer to next venue)    Recommendations for Other Services      Precautions / Restrictions Precautions Precautions: Fall;Other (comment) Precaution Comments: R hemiparesis, increased tone Restrictions Weight Bearing Restrictions: No       Mobility Bed Mobility Overal bed mobility: Needs Assistance Bed Mobility: Supine to Sit     Supine to sit: Max assist;+2 for physical assistance;Mod assist;+2 for safety/equipment     General bed mobility comments: modA+2 to prgoress BLE and trunk upright to sit EOB;  Transfers Overall transfer level: Needs assistance Equipment used:  Rolling walker (2 wheeled)   Sit to Stand: Max assist;+2 physical assistance        Lateral/Scoot Transfers: Max assist;+2 physical assistance General transfer comment: maxA+2 for lateral scoot towards L side, pt demonstrated initiation of reaching for chair, visual cues for correct hand placement;maxA+2 for sit<>stand from recliner x2 with RW;unable to achieve fully upright posture    Balance Overall balance assessment: Needs assistance Sitting-balance support: Feet supported;Single extremity supported Sitting balance-Leahy Scale: Poor Sitting balance - Comments: once sitting EOB pt immediately reacher for foot board demonstrating pusher syndrome and appeared fearful/anxious of movement;maxA for support in sitting when fearful/axious;pt appeared calmer after sitting for a few minutes and able to sit minguard-minA   Standing balance support: Single extremity supported Standing balance-Leahy Scale: Zero Standing balance comment: stood from reclinerx2 with RW;maxA+2 for stability in standing;pt demonstrates good strength with LUE to pull into standing                           ADL either performed or assessed with clinical judgement   ADL Overall ADL's : Needs assistance/impaired Eating/Feeding: Moderate assistance;Sitting Eating/Feeding Details (indicate cue type and reason): hand over hand assistance to use RUE to hold applesauce container to scoop with spoon in LUE;requires full supervision per SLP note; Grooming: Sitting;Maximal assistance Grooming Details (indicate cue type and reason): pt required multimodal cues to wash face;required maxA for sequencing to open lotion/deodorant and apply correctly Upper Body Bathing: Maximal assistance;Sitting   Lower Body Bathing: Total assistance   Upper Body Dressing : Maximal assistance;Sitting   Lower Body Dressing: Total assistance   Toilet Transfer: Maximal assistance;+2 for physical assistance;Requires drop arm Toilet  Transfer Details (indicate cue type  and reason): simulated lateral scoot from EOB to drop arm;pt appeared fearful with movement, once initiating movement appeared calmer Toileting- Clothing Manipulation and Hygiene: Total assistance       Functional mobility during ADLs: Maximal assistance;+2 for physical assistance;+2 for safety/equipment General ADL Comments: pt limited by cogntion, aphasia, apraxia and decreased functional use of RUE     Vision   Vision Assessment?: Vision impaired- to be further tested in functional context Additional Comments: pt appears to have R inattention;functionally pt appears to have difficulty looking past midline (towards R side)   Perception     Praxis      Cognition Arousal/Alertness: Awake/alert Behavior During Therapy: WFL for tasks assessed/performed;Anxious Overall Cognitive Status: Impaired/Different from baseline Area of Impairment: Following commands;Attention;Problem solving                   Current Attention Level: Sustained   Following Commands: Follows one step commands inconsistently     Problem Solving: Slow processing;Requires verbal cues;Requires tactile cues;Difficulty sequencing General Comments: demonstrated apraxia and agnosia with mobility and use of common objects, requiring multimodal cues for proper use of deodorant;globally aphasic;spontaneous inappropriate laughter        Exercises Exercises: Other exercises Other Exercises Other Exercises: scapular mobilization on RUE perform to achieve full elbow extension, neutralize wrist and extend digits for proper positioning of RUE in prepartation for self-feeding.    Shoulder Instructions       General Comments VSS     Pertinent Vitals/ Pain       Pain Assessment: Faces Faces Pain Scale: Hurts a little bit Pain Location: grimacing with passive motion to R hand and elbow Pain Descriptors / Indicators: Grimacing;Guarding Pain Intervention(s): Limited activity  within patient's tolerance;Monitored during session;Repositioned  Home Living                                          Prior Functioning/Environment              Frequency  Min 2X/week        Progress Toward Goals  OT Goals(current goals can now be found in the care plan section)  Progress towards OT goals: Progressing toward goals  Acute Rehab OT Goals Patient Stated Goal: unable OT Goal Formulation: Patient unable to participate in goal setting Time For Goal Achievement: 03/20/19 Potential to Achieve Goals: Good ADL Goals Pt Will Perform Grooming: with supervision;sitting Pt Will Perform Upper Body Dressing: with min assist;sitting Pt Will Perform Lower Body Dressing: with mod assist;sit to/from stand Pt Will Transfer to Toilet: with mod assist;stand pivot transfer;bedside commode Pt Will Perform Toileting - Clothing Manipulation and hygiene: with mod assist;sit to/from stand  Plan Discharge plan remains appropriate    Co-evaluation                 AM-PAC OT "6 Clicks" Daily Activity     Outcome Measure   Help from another person eating meals?: A Lot Help from another person taking care of personal grooming?: A Lot Help from another person toileting, which includes using toliet, bedpan, or urinal?: A Lot Help from another person bathing (including washing, rinsing, drying)?: Total Help from another person to put on and taking off regular upper body clothing?: A Lot Help from another person to put on and taking off regular lower body clothing?: Total 6 Click Score: 10    End of Session  Equipment Utilized During Treatment: Gait belt;Rolling walker  OT Visit Diagnosis: Unsteadiness on feet (R26.81);Other abnormalities of gait and mobility (R26.89);Muscle weakness (generalized) (M62.81);Other symptoms and signs involving cognitive function;Cognitive communication deficit (R41.841);Hemiplegia and hemiparesis;Pain Symptoms and signs involving  cognitive functions: Cerebral infarction Hemiplegia - Right/Left: Right Hemiplegia - dominant/non-dominant: Dominant Hemiplegia - caused by: Cerebral infarction Pain - Right/Left: Right Pain - part of body: Hand   Activity Tolerance Patient tolerated treatment well   Patient Left in chair;with call bell/phone within reach;with chair alarm set   Nurse Communication Mobility status;Need for lift equipment(recommend use of stedy to return to bed)        Time: 9629-5284 OT Time Calculation (min): 45 min  Charges: OT General Charges $OT Visit: 1 Visit OT Treatments $Self Care/Home Management : 38-52 mins  Dorinda Hill OTR/L Acute Rehabilitation Services Office: [place] 03/12/2019, 12:05 PM

## 2019-03-12 NOTE — TOC Transition Note (Signed)
Transition of Care [facility]) - CM/SW Discharge Note   Patient Details  Name: Dayvin Aber MRN: [id number] Date of Birth: 11/17/1950  Transition of [facility]) CM/SW Contact:  Kirstie Peri, [place] Work Phone Number: 03/12/2019, 11:30 AM   Clinical Narrative: Nurse to call report to  [phone].     Final next level of care: Skilled Nursing Facility Barriers to Discharge: Barriers Resolved   Patient Goals and CMS Choice   CMS Medicare.gov Compare Post Acute Care list provided to:: Patient Represenative (must comment) Choice offered to / list presented to : Adult Children(daughter)  Discharge Placement              Patient chooses bed at: [facility] Patient to be transferred to facility by: [place] Name of family member notified: Joelene Millin Patient and family notified of of transfer: 03/12/19  Discharge Plan and Services In-house Referral: Clinical Social Work Discharge Planning Services: CM Consult Post Acute Care Choice: [place]                               Social Determinants of Health (SDOH) Interventions     Readmission Risk Interventions No flowsheet data found.

## 2019-03-12 NOTE — Discharge Summary (Signed)
Physician Discharge Summary  Douglas [name] ZOX:[id number] DOB: 12-23-1950 DOA: 03/05/2019  PCP: Patient, No Pcp Per  Admit date: 03/05/2019 Discharge date: 03/12/2019  Time spent: 20 minutes  Recommendations for Outpatient Follow-up:  1. Note dosage changes of metoprolol atorvastatin lisinopril as well as Lovenox etc. 2. Note in addition Plavix and aspirin long-term well as the addition Keppra 500 twice daily 3. We will need stroke team follow-up in the outpatient setting in about 1 year 4. Recommend daily OT PT SLP follow-up to maximize rehab potential at skilled facility 5. Recommending 1 week Chem-12, CBC 6. If has any bleeding from loop recorder site follow-up with EP Dr. Elberta Fortis  Discharge Diagnoses:  Principal Problem:   Ischemic stroke [facility]) Active Problems:   Essential hypertension   Dyslipidemia   Type 2 diabetes mellitus with peripheral neuropathy (HCC)   UTI (urinary tract infection)   Discharge Condition: Improved  Diet recommendation: Improved but on dysphagia 3 thin liquids no straws and usual precautions  Filed Weights   03/05/19 1236 03/05/19 2330  Weight: 82.6 kg 82.6 kg    History of present illness:  68 year old community living male CVA 2013, last 1 04/04/2018 status post TPA + right-sided residual paresis/aphasia status post lengthy rehab stay until 05/2018,  HTN,  DM TY 2-  recent Rx urinary tract infection on Keflex  admit from home patient not acting himself 11/3 episodic urinary incontinence CTA head = extension left PCA territory infarct + thalamus-MRI brain =1 cm acute left corona radiata infarct  Neurology felt also could be secondary to seizure episode patient placed on Lamictal, Keppra 500 twice daily Cardiology consulted for loop recorder   Hospital Course:  CVA in the setting of multiple ones in 2013 and 2019-this time cryptogenic small left corona radiata infarct large left L MCA L PCA and multiple chronic lacune's Linq recorder  [employer] placed by Dr. Elberta Fortis 11/6 Should continue aspirin 325 Plavix 75 long term and as OP determine if to change dosing per Dr. Roda Shutters -long-term disposition SNF--Await Covid test and likely d/c when able  Bleeding from superficial Linq recorder ICD implant 11/8 AM Seems minimal thrombin pad applied- Cardiologist notified  and they should follow-up if there is an issue in the outpatient setting please keep pressure dressing if this recurs  Dysphagia secondary to stroke Speech therapy recommending dysphagia 3 nectar thick fluids after DG swallow   Probable seizure Continuing Lamictal 100 twice daily, Keppra 500 twice daily and monitor for further seizure activity  HTN Allow permissive hypertension for goal of 130/80 Resume Lisinopril 20 2 times per day-->20 qd metoprolol 25 xl resumed amlodipine 5 increased to 10 Hydralazine d/c this admit  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  Home meds Levemir 25 units --discharged on lower dose 16 units sliding scale in addition to Glucotrol 10 twice daily CBG  this admission relatively controlled between 180s-220s  Procedures:  Loop recorder placement 11/6  Echocardiogram EF >75% grade 1 diastolic dysfunction  MRI brain  CT head (i.e. Studies not automatically included, echos, thoracentesis, etc; not x-rays)  Consultations:  Cardiology neurology  Discharge Exam: Vitals:   03/12/19 0322 03/12/19 0816  BP: 123/86 (!) 158/91  Pulse: 72 87  Resp: 18 17  Temp: 97.6 F (36.4 C) 99.2 F (37.3 C)  SpO2: 98% 100%    General: Awake alert somewhat coherent but monosyllabic answering only yes Cardiovascular: S1-S2 no murmur rub or gallop Respiratory: Clinically clear no added sounds Dense deficit on right side with some  Discharge Instructions   Discharge Instructions    Ambulatory referral to Neurology   Complete by: As directed    Follow up with Dr. Pearlean Brownie at [facility] in 4-6 weeks. Too complicated for RN to follow. Thanks.      Allergies as of 03/12/2019      Reactions   Ciprofloxacin Anaphylaxis   Other Nausea And Vomiting, Other (See Comments)   Avacados      Medication List    STOP taking these medications   cephALEXin 500 MG capsule Commonly known as: KEFLEX   cloNIDine 0.1 MG tablet Commonly known as: CATAPRES   cloNIDine 0.2 MG tablet Commonly known as: CATAPRES   hydrALAZINE 50 MG tablet Commonly known as: APRESOLINE   methylphenidate 10 MG tablet Commonly known as: RITALIN   Resource ThickenUp Clear Powd     TAKE these medications   Accu-Chek Aviva Plus test strip Generic drug: glucose blood daily.   amLODipine 10 MG tablet Commonly known as: NORVASC Take 1 tablet (10 mg total) by mouth daily.   aspirin 325 MG EC tablet Take 1 tablet (325 mg total) by mouth daily.   atorvastatin 40 MG tablet Commonly known as: LIPITOR Take 1 tablet (40 mg total) by mouth daily at 6 PM. What changed:   medication strength  how much to take  when to take this   baclofen 10 MG tablet Commonly known as: LIORESAL Take 20 mg by mouth 2 (two) times daily.   clopidogrel 75 MG tablet Commonly known as: PLAVIX Take 1 tablet (75 mg total) by mouth daily.   glipiZIDE 10 MG tablet Commonly known as: GLUCOTROL Take 1 tablet (10 mg total) by mouth 2 (two) times daily.   insulin detemir 100 UNIT/ML injection Commonly known as: LEVEMIR Inject 0.16 mLs (16 Units total) into the skin daily. What changed:   how much to take  when to take this   lamoTRIgine 100 MG tablet Commonly known as: LAMICTAL Take 100 mg by mouth 2 (two) times daily.   levETIRAcetam 500 MG tablet Commonly known as: KEPPRA Take 1 tablet (500 mg total) by mouth 2 (two) times daily.   lisinopril 20 MG tablet Commonly known as: ZESTRIL Take 20 mg by mouth 2 (two) times daily.   metoprolol succinate 25 MG 24 hr tablet Commonly known as: TOPROL-XL Take 25 mg by mouth at bedtime. What changed: Another medication  with the same name was removed. Continue taking this medication, and follow the directions you see here.   NovoLOG FlexPen 100 UNIT/ML FlexPen Generic drug: insulin aspart Inject 3 Units into the skin See admin instructions. Inject 3-4 units into the skin up to two times a day before meals as needed for a BGL of 200 or greater- per sliding scale   pantoprazole 40 MG tablet Commonly known as: PROTONIX Take 1 tablet (40 mg total) by mouth daily.   tamsulosin 0.4 MG Caps capsule Commonly known as: FLOMAX Take 0.4 mg by mouth daily after supper.      Allergies  Allergen Reactions  . Ciprofloxacin Anaphylaxis  . Other Nausea And Vomiting and Other (See Comments)    Avacados   Follow-up Information    [facility] Follow up on 03/21/2019.   Why: at 1200 noon for post loop follow up Contact information: [address] [place] [phone] [phone]       Micki Riley, MD. Schedule an appointment as soon as possible for a visit in  4 week(s).   Specialties: Neurology, Radiology Contact information: [address] [place] [address] [phone]            The results of significant diagnostics from this hospitalization (including imaging, microbiology, ancillary and laboratory) are listed below for reference.    Significant Diagnostic Studies: Ct Angio Head W Or Wo Contrast  Result Date: 03/07/2019 CLINICAL DATA:  Stroke, follow-up EXAM: CT ANGIOGRAPHY HEAD AND NECK TECHNIQUE: Multidetector CT imaging of the head and neck was performed using the standard protocol during bolus administration of intravenous contrast. Multiplanar CT image reconstructions and MIPs were obtained to evaluate the vascular anatomy. Carotid stenosis measurements (when applicable) are obtained utilizing NASCET criteria, using the distal internal carotid diameter as the denominator. CONTRAST:  51mL OMNIPAQUE  IOHEXOL 350 MG/ML SOLN COMPARISON:  None. FINDINGS: CT HEAD FINDINGS Brain: No acute intracranial hemorrhage. Small left corona radiata infarcts on recent MRI are not seen. Large chronic infarcts of the left MCA and PCA territories are again identified. There is a chronic small vessel infarct of the right cerebellar hemisphere. Additional patchy and confluent areas of hypoattenuation in the supratentorial white matter likely reflect chronic microvascular ischemic changes. There is ex vacuo dilatation of the left lateral ventricle. Additional prominence of the ventricles and sulci reflects generalized parenchymal volume loss. Vascular: Atherosclerotic calcification at the skull base. Skull: Unremarkable. Sinuses: Minor mucosal thickening. Orbits: Orbits are unremarkable. Review of the MIP images confirms the above findings CTA NECK FINDINGS Aortic arch: Great vessel origins are patent. Right carotid system: Common, internal, and external carotid arteries are patent. Mild multifocal calcified plaque along the common carotid without stenosis. Eccentric noncalcified plaque ICA origin of the left 50% stenosis. Left carotid system: Common, internal, and external carotid arteries are patent. Calcified and noncalcified plaque at the bifurcation and along the proximal internal carotid causing less than 50% stenosis. Diminished caliber of the remainder of the cervical left ICA relative to the right is again noted without focal stenosis. Vertebral arteries: Patent. Left vertebral artery is dominant. No significant stenosis. Skeleton: Stable multilevel degenerative changes the cervical spine. Other neck: No neck mass or adenopathy. Upper chest: No apical lung mass. Review of the MIP images confirms the above findings CTA HEAD FINDINGS Anterior circulation: Intracranial internal carotid arteries are patent with multifocal calcified plaque resulting in at least moderate stenosis. Anterior cerebral arteries are patent with  moderate stenosis of the A1 segments. 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 Stable diffuse moderate stenosis of the right M1 MCA. Posterior circulation: Stable moderate-to-marked stenoses of the proximal intracranial right vertebral artery, which becomes diminutive after the origin of the PICA. Stable moderate stenosis of the intracranial left vertebral artery. Basilar artery is diffusely irregular and decreased in caliber. The right posterior cerebral artery is patent with diffuse irregularity and moderate stenosis of the P2 segment. The left posterior cerebral artery is occluded proximally. Venous sinuses: Not well evaluated. Anatomic variants: None significant Review of the MIP images confirms the above findings IMPRESSION: No acute intracranial findings. Small acute infarct on MRI is not well seen. No large vessel occlusion. No hemodynamically significant stenosis in the neck. Multifocal intracranial atherosclerosis is stable from the prior study. Electronically Signed   By: Guadlupe Spanish M.D.   On: 03/07/2019 11:43   Ct Head Wo Contrast  Result Date: 03/05/2019 CLINICAL DATA:  Altered mental status EXAM: CT HEAD WITHOUT CONTRAST TECHNIQUE: Contiguous axial images were obtained from the base of the skull through the  vertex without intravenous contrast. COMPARISON:  2019 FINDINGS: Brain: There is no acute hemorrhage or mass effect. There are large chronic infarcts involving the left PCA and MCA territories. There is new hypoattenuation involving the left thalamus. There is also new hypoattenuation within the medial left parietal lobe. New small focus of hypoattenuation within the right cerebellum likely reflects age-indeterminate infarction. Additional hypoattenuation in the supratentorial matter is nonspecific but likely reflects stable chronic microvascular ischemic changes. There is increased ex vacuo dilatation of the lateral and third ventricles. Vascular: No hyperdense vessel. Atherosclerotic  calcification at the skull base. Skull: Calvarium is unremarkable. Sinuses/Orbits: Patchy posterior left ethmoid mucosal thickening. Orbits are unremarkable. Other: Mastoid air cells are clear. IMPRESSION: New extension of left PCA territory infarction to involve thalamus and more of the parietal lobe. This is likely subacute or chronic although a component of acute ischemia is difficult to exclude. Large chronic left MCA territory infarction. New age-indeterminate small right cerebellar infarct. No acute intracranial hemorrhage. Chronic microvascular ischemic changes. Electronically Signed   By: Guadlupe Spanish M.D.   On: 03/05/2019 14:47   Ct Angio Neck W Or Wo Contrast  Result Date: 03/07/2019 CLINICAL DATA:  Stroke, follow-up EXAM: CT ANGIOGRAPHY HEAD AND NECK TECHNIQUE: Multidetector CT imaging of the head and neck was performed using the standard protocol during bolus administration of intravenous contrast. Multiplanar CT image reconstructions and MIPs were obtained to evaluate the vascular anatomy. Carotid stenosis measurements (when applicable) are obtained utilizing NASCET criteria, using the distal internal carotid diameter as the denominator. CONTRAST:  75mL OMNIPAQUE IOHEXOL 350 MG/ML SOLN COMPARISON:  None. FINDINGS: CT HEAD FINDINGS Brain: No acute intracranial hemorrhage. Small left corona radiata infarcts on recent MRI are not seen. Large chronic infarcts of the left MCA and PCA territories are again identified. There is a chronic small vessel infarct of the right cerebellar hemisphere. Additional patchy and confluent areas of hypoattenuation in the supratentorial white matter likely reflect chronic microvascular ischemic changes. There is ex vacuo dilatation of the left lateral ventricle. Additional prominence of the ventricles and sulci reflects generalized parenchymal volume loss. Vascular: Atherosclerotic calcification at the skull base. Skull: Unremarkable. Sinuses: Minor mucosal thickening.  Orbits: Orbits are unremarkable. Review of the MIP images confirms the above findings CTA NECK FINDINGS Aortic arch: Great vessel origins are patent. Right carotid system: Common, internal, and external carotid arteries are patent. Mild multifocal calcified plaque along the common carotid without stenosis. Eccentric noncalcified plaque ICA origin of the left 50% stenosis. Left carotid system: Common, internal, and external carotid arteries are patent. Calcified and noncalcified plaque at the bifurcation and along the proximal internal carotid causing less than 50% stenosis. Diminished caliber of the remainder of the cervical left ICA relative to the right is again noted without focal stenosis. Vertebral arteries: Patent. Left vertebral artery is dominant. No significant stenosis. Skeleton: Stable multilevel degenerative changes the cervical spine. Other neck: No neck mass or adenopathy. Upper chest: No apical lung mass. Review of the MIP images confirms the above findings CTA HEAD FINDINGS Anterior circulation: Intracranial internal carotid arteries are patent with multifocal calcified plaque resulting in at least moderate stenosis. Anterior cerebral arteries are patent with moderate stenosis of the A1 segments. 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 Stable diffuse moderate stenosis of the right M1 MCA. Posterior circulation: Stable moderate-to-marked stenoses of the proximal intracranial right vertebral artery, which becomes diminutive after the origin of the PICA. Stable moderate stenosis of the intracranial left vertebral artery. Basilar  artery is diffusely irregular and decreased in caliber. The right posterior cerebral artery is patent with diffuse irregularity and moderate stenosis of the P2 segment. The left posterior cerebral artery is occluded proximally. Venous sinuses: Not well evaluated. Anatomic variants: None significant Review of the MIP images confirms the above findings IMPRESSION: No acute  intracranial findings. Small acute infarct on MRI is not well seen. No large vessel occlusion. No hemodynamically significant stenosis in the neck. Multifocal intracranial atherosclerosis is stable from the prior study. Electronically Signed   By: Guadlupe Spanish M.D.   On: 03/07/2019 11:43   Mr Brain Wo Contrast  Result Date: 03/05/2019 CLINICAL DATA:  Altered level of consciousness, unexplained. Unresponsive episode. EXAM: MRI HEAD WITHOUT CONTRAST TECHNIQUE: Multiplanar, multiecho pulse sequences of the brain and surrounding structures were obtained without intravenous contrast. COMPARISON:  Head CT 03/05/2019 and MRI 03/31/2018 FINDINGS: Brain: Large chronic infarcts are again seen involving the left MCA and left PCA territories with associated chronic hemosiderin deposition, ex vacuo dilatation of the left lateral ventricle, and wallerian degeneration extending into the brainstem. A 1 cm acute infarct is noted in the left corona radiata. Numerous chronic microhemorrhages are again seen in the deep gray nuclei, brainstem, and cerebellum compatible with chronic hypertension. There is a background of severe chronic small vessel ischemic disease in the cerebral white matter including chronic lacunar infarcts in the right corona radiata and deep gray nuclei as well as pons and cerebellum. A chronic lacunar infarct in the right cerebellar hemisphere is new from the prior MRI. There is mild leftward midline shift related to left cerebral hemispheric volume loss. No mass or extra-axial fluid collection is identified. Vascular: Abnormal appearance of the left MCA associated with the chronic infarct. Other major intracranial vascular flow voids are preserved. Skull and upper cervical spine: Unremarkable bone marrow signal. Sinuses/Orbits: Unremarkable orbits. Paranasal sinuses and mastoid air cells are clear. Other: None. IMPRESSION: 1. 1 cm acute left corona radiata infarct. 2. Large chronic left MCA and left PCA  infarct. 3. Severe chronic small vessel ischemic disease with numerous chronic lacunar infarcts as above. Electronically Signed   By: Sebastian Ache M.D.   On: 03/05/2019 18:38   Dg Chest Portable 1 View  Result Date: 03/05/2019 CLINICAL DATA:  68 year old male with vomiting and cough. Query aspiration. EXAM: PORTABLE CHEST 1 VIEW COMPARISON:  Portable chest 04/03/2018. FINDINGS: Portable AP semi upright view at 1315 hours. Stable somewhat low lung volumes. Mediastinal contours remain normal. Visualized tracheal air column is within normal limits. Stable lungs, mild apical scarring but otherwise allowing for portable technique the lungs are clear. Negative visible bowel gas pattern. No acute osseous abnormality identified. Chronic glenohumeral degeneration. IMPRESSION: No acute cardiopulmonary abnormality. Electronically Signed   By: Odessa Fleming M.D.   On: 03/05/2019 13:34   Dg Swallowing Func-speech Pathology  Result Date: 03/11/2019 Objective Swallowing Evaluation: Type of Study: MBS-Modified Barium Swallow Study  Patient Details Name: Douglas [name] MRN: [id number] Date of Birth: Dec 10, 1950 Today's Date: 03/11/2019 Time: SLP Start Time (ACUTE ONLY): 0930 -SLP Stop Time (ACUTE ONLY): 0948 SLP Time Calculation (min) (ACUTE ONLY): 18 min Past Medical History: Past Medical History: Diagnosis Date . Diabetes mellitus without complication (HCC)  . Hypertension  . Stroke [facility])  Past Surgical History: Past Surgical History: Procedure Laterality Date . IR PATIENT EVAL TECH 0-60 MINS  03/30/2018 . LOOP RECORDER INSERTION N/A 03/08/2019  Procedure: LOOP RECORDER INSERTION;  Surgeon: Regan Lemming, MD;  Location: [facility] INVASIVE CV  LAB;  Service: Cardiovascular;  Laterality: N/A; . TEE WITHOUT CARDIOVERSION N/A 04/02/2018  Procedure: TRANSESOPHAGEAL ECHOCARDIOGRAM (TEE);  Surgeon: [name], MD;  Location: [facility] ENDOSCOPY;  Service: Cardiovascular;  Laterality: N/A; HPI: Vannie Hochstetler is a 68 y.o. male with  medical history significant of stroke with residual right sided weakness and aphasia, hypertension, diabetes presents to emergency department due to altered mental status. MRI showing 1 cm acute L corona radiata, chronic L MCA & PCA.  Pt was on CIR in December 2019 following his left MCA and ACA CVA and was on a dys2/nectar diet upon dc and was working on communicating basic needs via multimodal communication.  Pt failed [facility] as she stopped drinking water.  Subjective: Pt was awake/alert. Assessment / Plan / Recommendation CHL IP CLINICAL IMPRESSIONS 03/11/2019 Clinical Impression Pt presents with mild oropharyngeal dysphagia with resultant trace, sensed aspiration of thin liquid via straw sip on today's evaluation.  No laryngeal penetration or aspiration was observed with thin liquid via tsp/cup sip, nectar-thick liquid, puree, or regular solids.  Oral phase was remarkable for prolonged mastication, reduced bolus cohesion, and reduced lingual control resulting in premature spillage to the pharynx.  Pharyngeal phase was remarkable for reduced laryngeal closure with thin liquid via straw sip, resulting in aspiration during the swallow.  Aspiration was sensed and pt was able to effectively clear aspirated material with a spontaneous cough.  Pharyngoesophageal phase was unremarkable.  Swallow initiation was at the level of the pyriform sinuses for all consistencies except for puree which occurred over the tip of the epiglottis.  Recommend Dysphagia 3 (mech soft) solids and thin liquid with the following precautions: 1) NO STRAWS 2) Single sips 3) Small bites/sips 4) Slow rate of intake 5) Sit upright 90 degrees.   SLP Visit Diagnosis Dysphagia, oropharyngeal phase (R13.12) Attention and concentration deficit following -- Frontal lobe and executive function deficit following -- Impact on safety and function Mild aspiration risk   CHL IP TREATMENT RECOMMENDATION 03/11/2019 Treatment Recommendations Therapy as outlined in  treatment plan below   Prognosis 03/11/2019 Prognosis for Safe Diet Advancement Good Barriers to Reach Goals Language deficits Barriers/Prognosis Comment -- CHL IP DIET RECOMMENDATION 03/11/2019 SLP Diet Recommendations Thin liquid;Dysphagia 3 (Mech soft) solids Liquid Administration via No straw;Cup Medication Administration Whole meds with puree Compensations Slow rate;Small sips/bites Postural Changes Seated upright at 90 degrees   CHL IP OTHER RECOMMENDATIONS 03/11/2019 Recommended Consults -- Oral Care Recommendations Oral care BID;Staff/trained caregiver to provide oral care Other Recommendations --   CHL IP FOLLOW UP RECOMMENDATIONS 03/11/2019 Follow up Recommendations 24 hour supervision/assistance;Skilled Nursing facility   [facility] IP FREQUENCY AND DURATION 03/11/2019 Speech Therapy Frequency (ACUTE ONLY) min 1 x/week Treatment Duration 2 weeks      CHL IP ORAL PHASE 03/11/2019 Oral Phase Impaired Oral - Pudding Teaspoon -- Oral - Pudding Cup -- Oral - Honey Teaspoon -- Oral - Honey Cup -- Oral - Nectar Teaspoon -- Oral - Nectar Cup -- Oral - Nectar Straw Decreased bolus cohesion;Premature spillage Oral - Thin Teaspoon Decreased bolus cohesion;Premature spillage Oral - Thin Cup Decreased bolus cohesion;Premature spillage Oral - Thin Straw Decreased bolus cohesion;Premature spillage Oral - Puree Premature spillage Oral - Mech Soft Decreased bolus cohesion;Premature spillage;Delayed oral transit;Impaired mastication Oral - Regular -- Oral - Multi-Consistency -- Oral - Pill -- Oral Phase - Comment --  CHL IP PHARYNGEAL PHASE 03/11/2019 Pharyngeal Phase Impaired Pharyngeal- Pudding Teaspoon -- Pharyngeal -- Pharyngeal- Pudding Cup -- Pharyngeal -- Pharyngeal- Honey Teaspoon -- Pharyngeal --  Pharyngeal- Honey Cup -- Pharyngeal -- Pharyngeal- Nectar Teaspoon -- Pharyngeal -- Pharyngeal- Nectar Cup -- Pharyngeal -- Pharyngeal- Nectar Straw Delayed swallow initiation-pyriform sinuses Pharyngeal Material does not enter airway  Pharyngeal- Thin Teaspoon Delayed swallow initiation-pyriform sinuses Pharyngeal Material does not enter airway Pharyngeal- Thin Cup Delayed swallow initiation-pyriform sinuses Pharyngeal Material does not enter airway Pharyngeal- Thin Straw Delayed swallow initiation-vallecula;Reduced airway/laryngeal closure;Trace aspiration;Penetration/Aspiration during swallow Pharyngeal Material enters airway, passes BELOW cords then ejected out Pharyngeal- Puree Delayed swallow initiation-vallecula Pharyngeal Material does not enter airway Pharyngeal- Mechanical Soft Delayed swallow initiation-pyriform sinuses Pharyngeal Material does not enter airway Pharyngeal- Regular -- Pharyngeal -- Pharyngeal- Multi-consistency -- Pharyngeal -- Pharyngeal- Pill -- Pharyngeal -- Pharyngeal Comment --  [facility] IP CERVICAL ESOPHAGEAL PHASE 03/11/2019 Cervical Esophageal Phase WFL Pudding Teaspoon -- Pudding Cup -- Honey Teaspoon -- Honey Cup -- Nectar Teaspoon -- Nectar Cup -- Nectar Straw -- Thin Teaspoon -- Thin Cup -- Thin Straw -- Puree -- Mechanical Soft -- Regular -- Multi-consistency -- Pill -- Cervical Esophageal Comment -- Villa Herb M.S., CCC-SLP Acute Rehabilitation Services Office: [phone] Shanon Rosser [facility] 03/11/2019, 10:16 AM              Vas US Carotid (at [facility] Only)  Result Date: 03/06/2019 Carotid Arterial Duplex Study Indications:       CVA. Comparison Study:  no prior Performing Technologist: Levada Schilling RDMS, RVT  Examination Guidelines: A complete evaluation includes B-mode imaging, spectral Doppler, color Doppler, and power Doppler as needed of all accessible portions of each vessel. Bilateral testing is considered an integral part of a complete examination. Limited examinations for reoccurring indications may be performed as noted.  Right Carotid Findings: +----------+--------+--------+--------+------------------+--------+           PSV cm/sEDV cm/sStenosisPlaque DescriptionComments  +----------+--------+--------+--------+------------------+--------+ CCA Prox  94      18              heterogenous               +----------+--------+--------+--------+------------------+--------+ CCA Distal115     22                                         +----------+--------+--------+--------+------------------+--------+ ICA Prox  78      26      1-39%   heterogenous               +----------+--------+--------+--------+------------------+--------+ ICA Distal86      30                                         +----------+--------+--------+--------+------------------+--------+ +----------+--------+-------+----------------+-------------------+           PSV cm/sEDV cmsDescribe        Arm Pressure (mmHG) +----------+--------+-------+----------------+-------------------+ Subclavian130            Multiphasic, WNL                    +----------+--------+-------+----------------+-------------------+ +---------+--------+--+--------+--+----------------------------+ VertebralPSV cm/s64EDV cm/s20Antegrade and High resistant +---------+--------+--+--------+--+----------------------------+  Left Carotid Findings: +----------+--------+--------+--------+------------------+-----------------+           PSV cm/sEDV cm/sStenosisPlaque DescriptionComments          +----------+--------+--------+--------+------------------+-----------------+ CCA Prox  85      9                                                   +----------+--------+--------+--------+------------------+-----------------+  CCA Distal73      11              homogeneous                         +----------+--------+--------+--------+------------------+-----------------+ ICA Prox  30      7       1-39%   heterogenous      atypical waveform +----------+--------+--------+--------+------------------+-----------------+ ICA Mid   71      14                                atypical waveform  +----------+--------+--------+--------+------------------+-----------------+ ICA Distal87      17                                atypical waveform +----------+--------+--------+--------+------------------+-----------------+ ECA       349     31      >50%    heterogenous                        +----------+--------+--------+--------+------------------+-----------------+ +----------+--------+--------+----------------+-------------------+           PSV cm/sEDV cm/sDescribe        Arm Pressure (mmHG) +----------+--------+--------+----------------+-------------------+ Subclavian150             Multiphasic, WNL                    +----------+--------+--------+----------------+-------------------+ +---------+--------+--+--------+--+---------+ VertebralPSV cm/s53EDV cm/s16Antegrade +---------+--------+--+--------+--+---------+  Summary: Right Carotid: Velocities in the right ICA are consistent with a 1-39% stenosis. Left Carotid: Velocities in the left ICA are consistent with a 1-39% stenosis.               Dampened, high resistant ICA waveforms suggestive of a more distal               obstruction.                Left ECA velocities consistent with >50% stenosis.  *See table(s) above for measurements and observations.  Electronically signed by Gretta Began MD on 03/06/2019 at 6:25:38 PM.    Final    Vas [place] Lower Extremity Venous (dvt) (only [facility])  Result Date: 02/21/2019  Lower Venous Study Indications: Edema.  Performing Technologist: Blanch Media RVS  Examination Guidelines: A complete evaluation includes B-mode imaging, spectral Doppler, color Doppler, and power Doppler as needed of all accessible portions of each vessel. Bilateral testing is considered an integral part of a complete examination. Limited examinations for reoccurring indications may be performed as noted.  +---------+---------------+---------+-----------+----------+--------------+ RIGHT     CompressibilityPhasicitySpontaneityPropertiesThrombus Aging +---------+---------------+---------+-----------+----------+--------------+ CFV      Full           Yes      Yes                                 +---------+---------------+---------+-----------+----------+--------------+ SFJ      Full                                                        +---------+---------------+---------+-----------+----------+--------------+ FV Prox  Full                                                        +---------+---------------+---------+-----------+----------+--------------+  FV Mid   Full                                                        +---------+---------------+---------+-----------+----------+--------------+ FV DistalFull                                                        +---------+---------------+---------+-----------+----------+--------------+ PFV      Full                                                        +---------+---------------+---------+-----------+----------+--------------+ POP      Full           Yes      Yes                                 +---------+---------------+---------+-----------+----------+--------------+ PTV      Full                                                        +---------+---------------+---------+-----------+----------+--------------+ PERO     Full                                                        +---------+---------------+---------+-----------+----------+--------------+     Summary: Right: There is no evidence of deep vein thrombosis in the lower extremity. No cystic structure found in the popliteal fossa.  *See table(s) above for measurements and observations. Electronically signed by Lemar Livings MD on 02/21/2019 at 2:16:28 PM.    Final     Microbiology: Recent Results (from the past 240 hour(s))  Blood culture (routine x 2)     Status: None   Collection Time: 03/05/19  2:00 PM   Specimen:  BLOOD LEFT FOREARM  Result Value Ref Range Status   Specimen Description BLOOD LEFT FOREARM  Final   Special Requests   Final    BOTTLES DRAWN AEROBIC AND ANAEROBIC Blood Culture adequate volume   Culture   Final    NO GROWTH 5 DAYS Performed at [facility], [address] [place], [place] [address]    Report Status 03/10/2019 FINAL  Final  Urine culture     Status: Abnormal   Collection Time: 03/05/19  2:36 PM   Specimen: Urine, Random  Result Value Ref Range Status   Specimen Description URINE, RANDOM  Final   Special Requests   Final    NONE Performed at [facility], [address] [place], [place] [address]    Culture >=100,000 COLONIES/mL PROTEUS MIRABILIS (A)  Final   Report Status 03/07/2019 FINAL  Final  Organism ID, Bacteria PROTEUS MIRABILIS (A)  Final      Susceptibility   Proteus mirabilis - MIC*    AMPICILLIN <=2 SENSITIVE Sensitive     CEFAZOLIN <=4 SENSITIVE Sensitive     CEFTRIAXONE <=1 SENSITIVE Sensitive     CIPROFLOXACIN <=0.25 SENSITIVE Sensitive     GENTAMICIN <=1 SENSITIVE Sensitive     IMIPENEM 4 SENSITIVE Sensitive     NITROFURANTOIN 128 RESISTANT Resistant     TRIMETH/SULFA <=20 SENSITIVE Sensitive     AMPICILLIN/SULBACTAM <=2 SENSITIVE Sensitive     PIP/TAZO <=4 SENSITIVE Sensitive     * >=100,000 COLONIES/mL PROTEUS MIRABILIS  SARS CORONAVIRUS 2 (TAT 6-24 HRS) Nasopharyngeal Nasopharyngeal Swab     Status: None   Collection Time: 03/05/19  3:24 PM   Specimen: Nasopharyngeal Swab  Result Value Ref Range Status   SARS Coronavirus 2 NEGATIVE NEGATIVE Final    Comment: (NOTE) SARS-CoV-2 target nucleic acids are NOT DETECTED. The SARS-CoV-2 RNA is generally detectable in upper and lower respiratory specimens during the acute phase of infection. Negative results do not preclude SARS-CoV-2 infection, do not rule out co-infections with other pathogens, and should not be used as the sole basis for treatment or other patient management  decisions. Negative results must be combined with clinical observations, patient history, and epidemiological information. The expected result is Negative. Fact Sheet for Patients: HairSlick.nohttps://www.fda.gov/media/138098/download Fact Sheet for Healthcare Providers: quierodirigir.comhttps://www.fda.gov/media/138095/download This test is not yet approved or cleared by the [place] FDA and  has been authorized for detection and/or diagnosis of SARS-CoV-2 by FDA under an Emergency Use Authorization (EUA). This EUA will remain  in effect (meaning this test can be used) for the duration of the COVID-19 declaration under Section 56 4(b)(1) of the Act, 21 U.S.C. section 360bbb-3(b)(1), unless the authorization is terminated or revoked sooner. Performed at [facility], [address] [place], [place] [address]   Blood culture (routine x 2)     Status: None   Collection Time: 03/05/19  3:49 PM   Specimen: BLOOD  Result Value Ref Range Status   Specimen Description BLOOD LEFT WRIST  Final   Special Requests   Final    BOTTLES DRAWN AEROBIC ONLY Blood Culture results may not be optimal due to an inadequate volume of blood received in culture bottles   Culture   Final    NO GROWTH 5 DAYS Performed at [facility], [address] [place], [place] [address]    Report Status 03/10/2019 FINAL  Final  SARS CORONAVIRUS 2 (TAT 6-24 HRS) Nasopharyngeal Nasopharyngeal Swab     Status: None   Collection Time: 03/11/19 11:15 AM   Specimen: Nasopharyngeal Swab  Result Value Ref Range Status   SARS Coronavirus 2 NEGATIVE NEGATIVE Final    Comment: (NOTE) SARS-CoV-2 target nucleic acids are NOT DETECTED. The SARS-CoV-2 RNA is generally detectable in upper and lower respiratory specimens during the acute phase of infection. Negative results do not preclude SARS-CoV-2 infection, do not rule out co-infections with other pathogens, and should not be used as the sole basis for treatment or other patient  management decisions. Negative results must be combined with clinical observations, patient history, and epidemiological information. The expected result is Negative. Fact Sheet for Patients: HairSlick.nohttps://www.fda.gov/media/138098/download Fact Sheet for Healthcare Providers: quierodirigir.comhttps://www.fda.gov/media/138095/download This test is not yet approved or cleared by the [place] FDA and  has been authorized for detection and/or diagnosis of SARS-CoV-2 by FDA under an Emergency Use Authorization (EUA). This EUA  will remain  in effect (meaning this test can be used) for the duration of the COVID-19 declaration under Section 56 4(b)(1) of the Act, 21 U.S.C. section 360bbb-3(b)(1), unless the authorization is terminated or revoked sooner. Performed at [facility], [place] [address], [place], [place] [address]      Labs: Basic Metabolic Panel: Recent Labs  Lab 03/05/19 1302 03/05/19 1416 03/05/19 1720 03/08/19 0251 03/09/19 0248 03/10/19 0244 03/11/19 0425  NA 145 144  --  140 140 137 137  K 4.0 3.9  --  3.9 3.7 3.8 4.1  CL 104 104  --  105 105 102 101  CO2 27  --   --  28 26 26 26   GLUCOSE 209* 202*  --  199* 224* 240* 221*  BUN 17 20  --  16 13 11 11   CREATININE 1.12 1.00 0.94 1.27* 1.03 0.86 1.00  CALCIUM 10.0  --   --  9.2 9.0 8.9 9.0   Liver Function Tests: Recent Labs  Lab 03/05/19 1302  AST 19  ALT 39  ALKPHOS 103  BILITOT 0.6  PROT 7.2  ALBUMIN 4.0   No results for input(s): LIPASE, AMYLASE in the last 168 hours. No results for input(s): AMMONIA in the last 168 hours. CBC: Recent Labs  Lab 03/05/19 1302  03/05/19 1720 03/08/19 0251 03/09/19 0248 03/10/19 0244 03/11/19 0425  WBC 7.8  --  7.4 5.9 5.1 5.5 4.9  NEUTROABS 6.6  --   --   --   --   --   --   HGB 14.4   < > 13.8 13.0 12.1* 12.8* 12.1*  HCT 45.3   < > 43.3 40.0 36.4* 38.8* 36.6*  MCV 93.2  --  92.9 92.4 91.0 91.5 91.0  PLT 239  --  231 219 215 191 194   < > = values in this interval not  displayed.   Cardiac Enzymes: No results for input(s): CKTOTAL, CKMB, CKMBINDEX, TROPONINI in the last 168 hours. BNP: BNP (last 3 results) No results for input(s): BNP in the last 8760 hours.  ProBNP (last 3 results) No results for input(s): PROBNP in the last 8760 hours.  CBG: Recent Labs  Lab 03/11/19 0613 03/11/19 1238 03/11/19 1624 03/11/19 2136 03/12/19 0600  GLUCAP 212* 231* 207* 150* 184*       Signed:  Nita Sells MD   [facility] 03/12/2019, 10:21 AM

## 2019-03-12 NOTE — Progress Notes (Addendum)
Patient being discharged to [facility], report given to receiving RN Michelle Nasuti). Transportation provided by [employer], belongings returned to patient. Douglas [name]

## 2019-03-21 ENCOUNTER — Ambulatory Visit (INDEPENDENT_AMBULATORY_CARE_PROVIDER_SITE_OTHER): Payer: Medicare Other | Admitting: *Deleted

## 2019-03-21 ENCOUNTER — Other Ambulatory Visit: Payer: Self-pay

## 2019-03-21 DIAGNOSIS — I639 Cerebral infarction, unspecified: Secondary | ICD-10-CM

## 2019-03-21 LAB — CUP PACEART INCLINIC DEVICE CHECK
Date Time Interrogation Session: 20201119192205
Implantable Pulse Generator Implant Date: 20201106

## 2019-03-21 NOTE — Progress Notes (Signed)
Presented to lobby to bring patient to device room. Patient's daughter concerned as patient, who is aphasic and WC-bound, seems to be in pain. Patient sitting up in chair wearing mask, but appeared to be grimacing/wincing and was unable to point to location of pain. Offered to help reposition patient in chair, but daughter reports she has already done this and is very worried as he has not had any issues with pain recently. Offered to call EMS, which daughter agreed to. Went to pull demographic sheet for EMS prior to calling. Returned to patient, who by this point had his chair-back semi-reclined. Patient smiling, pointing forward, nodded when asked if he was feeling better. Patient's daughter reported she no longer wanted this RN to call EMS. Visit completed without further incident, patient in NAD during visit. Encouraged PCP f/u or ED if concerns about worsening symptoms. Patient's daughter verbalizes understanding.  Patient not taking Keppra at present due to reported adverse-effects in the past per his daughter (caused hallucinations). Encouraged her to discuss with Dr. Leonie Man at upcoming [place] on 04/02/19.   ILR wound check in clinic. Steri strips removed by family prior to visit. Incision edges fully approximated and healing well. No drainage, redness, or swelling, ecchymosis noted. Daughter reports site was bleeding yesterday so patient's home health RN removed Steri-strips and cleansed site. Advised if any new bleeding, contact our office. Home monitor transmitting nightly. No episodes. Questions answered. ROV PRN.  Patient's daughter Chauncey Reading) gave verbal permission to include the following photo:

## 2019-04-02 ENCOUNTER — Inpatient Hospital Stay: Payer: Commercial Indemnity | Admitting: Neurology

## 2019-04-02 ENCOUNTER — Telehealth: Payer: Self-pay

## 2019-04-02 NOTE — Telephone Encounter (Signed)
Pt no show for appt today.

## 2019-04-09 ENCOUNTER — Encounter: Payer: Self-pay | Admitting: Neurology

## 2019-04-15 ENCOUNTER — Ambulatory Visit (INDEPENDENT_AMBULATORY_CARE_PROVIDER_SITE_OTHER): Payer: Medicare Other | Admitting: *Deleted

## 2019-04-15 DIAGNOSIS — I639 Cerebral infarction, unspecified: Secondary | ICD-10-CM | POA: Diagnosis not present

## 2019-04-15 LAB — CUP PACEART REMOTE DEVICE CHECK
Date Time Interrogation Session: 20201214082347
Implantable Pulse Generator Implant Date: 20201106

## 2019-05-14 NOTE — Progress Notes (Signed)
ILR remote 

## 2019-05-17 ENCOUNTER — Ambulatory Visit (INDEPENDENT_AMBULATORY_CARE_PROVIDER_SITE_OTHER): Payer: Medicare Other | Admitting: *Deleted

## 2019-05-17 DIAGNOSIS — I639 Cerebral infarction, unspecified: Secondary | ICD-10-CM

## 2019-05-19 LAB — CUP PACEART REMOTE DEVICE CHECK
Date Time Interrogation Session: 20210116060230
Implantable Pulse Generator Implant Date: 20201106

## 2019-06-05 ENCOUNTER — Emergency Department (HOSPITAL_COMMUNITY): Payer: Medicare Other

## 2019-06-05 ENCOUNTER — Other Ambulatory Visit: Payer: Self-pay

## 2019-06-05 ENCOUNTER — Inpatient Hospital Stay (HOSPITAL_COMMUNITY)
Admission: EM | Admit: 2019-06-05 | Discharge: 2019-06-09 | DRG: 948 | Disposition: A | Discharge status: Home / Self Care | Payer: Medicare Other | Attending: Family Medicine | Admitting: Family Medicine

## 2019-06-05 DIAGNOSIS — N400 Enlarged prostate without lower urinary tract symptoms: Secondary | ICD-10-CM | POA: Diagnosis present

## 2019-06-05 DIAGNOSIS — E1142 Type 2 diabetes mellitus with diabetic polyneuropathy: Secondary | ICD-10-CM | POA: Diagnosis present

## 2019-06-05 DIAGNOSIS — N179 Acute kidney failure, unspecified: Secondary | ICD-10-CM | POA: Diagnosis not present

## 2019-06-05 DIAGNOSIS — I10 Essential (primary) hypertension: Secondary | ICD-10-CM | POA: Diagnosis present

## 2019-06-05 DIAGNOSIS — Z881 Allergy status to other antibiotic agents status: Secondary | ICD-10-CM | POA: Diagnosis not present

## 2019-06-05 DIAGNOSIS — R4182 Altered mental status, unspecified: Secondary | ICD-10-CM

## 2019-06-05 DIAGNOSIS — I69351 Hemiplegia and hemiparesis following cerebral infarction affecting right dominant side: Secondary | ICD-10-CM | POA: Diagnosis not present

## 2019-06-05 DIAGNOSIS — R1312 Dysphagia, oropharyngeal phase: Secondary | ICD-10-CM | POA: Diagnosis present

## 2019-06-05 DIAGNOSIS — R451 Restlessness and agitation: Secondary | ICD-10-CM | POA: Diagnosis present

## 2019-06-05 DIAGNOSIS — Z20822 Contact with and (suspected) exposure to covid-19: Secondary | ICD-10-CM | POA: Diagnosis present

## 2019-06-05 DIAGNOSIS — Z7982 Long term (current) use of aspirin: Secondary | ICD-10-CM | POA: Diagnosis not present

## 2019-06-05 DIAGNOSIS — I69991 Dysphagia following unspecified cerebrovascular disease: Secondary | ICD-10-CM | POA: Diagnosis not present

## 2019-06-05 DIAGNOSIS — L8990 Pressure ulcer of unspecified site, unspecified stage: Secondary | ICD-10-CM | POA: Insufficient documentation

## 2019-06-05 DIAGNOSIS — R404 Transient alteration of awareness: Secondary | ICD-10-CM | POA: Diagnosis not present

## 2019-06-05 DIAGNOSIS — Z79899 Other long term (current) drug therapy: Secondary | ICD-10-CM

## 2019-06-05 DIAGNOSIS — Z8249 Family history of ischemic heart disease and other diseases of the circulatory system: Secondary | ICD-10-CM | POA: Diagnosis not present

## 2019-06-05 DIAGNOSIS — R569 Unspecified convulsions: Secondary | ICD-10-CM | POA: Diagnosis present

## 2019-06-05 DIAGNOSIS — Z7902 Long term (current) use of antithrombotics/antiplatelets: Secondary | ICD-10-CM | POA: Diagnosis not present

## 2019-06-05 DIAGNOSIS — Z888 Allergy status to other drugs, medicaments and biological substances status: Secondary | ICD-10-CM

## 2019-06-05 DIAGNOSIS — R410 Disorientation, unspecified: Secondary | ICD-10-CM | POA: Diagnosis not present

## 2019-06-05 DIAGNOSIS — E785 Hyperlipidemia, unspecified: Secondary | ICD-10-CM | POA: Diagnosis present

## 2019-06-05 DIAGNOSIS — Z7984 Long term (current) use of oral hypoglycemic drugs: Secondary | ICD-10-CM | POA: Diagnosis not present

## 2019-06-05 HISTORY — DX: Altered mental status, unspecified: R41.82

## 2019-06-05 LAB — CBC
HCT: 44.4 % (ref 39.0–52.0)
HCT: 45.3 % (ref 39.0–52.0)
Hemoglobin: 14.3 g/dL (ref 13.0–17.0)
Hemoglobin: 14.7 g/dL (ref 13.0–17.0)
MCH: 30 pg (ref 26.0–34.0)
MCH: 30.4 pg (ref 26.0–34.0)
MCHC: 32.2 g/dL (ref 30.0–36.0)
MCHC: 32.5 g/dL (ref 30.0–36.0)
MCV: 93.3 fL (ref 80.0–100.0)
MCV: 93.8 fL (ref 80.0–100.0)
Platelets: 195 10*3/uL (ref 150–400)
Platelets: 200 10*3/uL (ref 150–400)
RBC: 4.76 MIL/uL (ref 4.22–5.81)
RBC: 4.83 MIL/uL (ref 4.22–5.81)
RDW: 13 % (ref 11.5–15.5)
RDW: 13 % (ref 11.5–15.5)
WBC: 5.1 10*3/uL (ref 4.0–10.5)
WBC: 5.8 10*3/uL (ref 4.0–10.5)
nRBC: 0 % (ref 0.0–0.2)
nRBC: 0 % (ref 0.0–0.2)

## 2019-06-05 LAB — DIFFERENTIAL
Abs Immature Granulocytes: 0.01 10*3/uL (ref 0.00–0.07)
Basophils Absolute: 0 10*3/uL (ref 0.0–0.1)
Basophils Relative: 0 %
Eosinophils Absolute: 0.2 10*3/uL (ref 0.0–0.5)
Eosinophils Relative: 3 %
Immature Granulocytes: 0 %
Lymphocytes Relative: 22 %
Lymphs Abs: 1.1 10*3/uL (ref 0.7–4.0)
Monocytes Absolute: 0.3 10*3/uL (ref 0.1–1.0)
Monocytes Relative: 6 %
Neutro Abs: 3.5 10*3/uL (ref 1.7–7.7)
Neutrophils Relative %: 69 %

## 2019-06-05 LAB — COMPREHENSIVE METABOLIC PANEL
ALT: 21 U/L (ref 0–44)
AST: 13 U/L — ABNORMAL LOW (ref 15–41)
Albumin: 3.9 g/dL (ref 3.5–5.0)
Alkaline Phosphatase: 83 U/L (ref 38–126)
Anion gap: 7 (ref 5–15)
BUN: 12 mg/dL (ref 8–23)
CO2: 24 mmol/L (ref 22–32)
Calcium: 9.2 mg/dL (ref 8.9–10.3)
Chloride: 110 mmol/L (ref 98–111)
Creatinine, Ser: 0.77 mg/dL (ref 0.61–1.24)
GFR calc Af Amer: >60 mL/min (ref >60)
GFR calc non Af Amer: >60 mL/min (ref >60)
Glucose, Bld: 161 mg/dL — ABNORMAL HIGH (ref 70–99)
Potassium: 3.5 mmol/L (ref 3.5–5.1)
Sodium: 141 mmol/L (ref 135–145)
Total Bilirubin: 1.1 mg/dL (ref 0.3–1.2)
Total Protein: 6.7 g/dL (ref 6.5–8.1)

## 2019-06-05 LAB — VITAMIN D 25 HYDROXY (VIT D DEFICIENCY, FRACTURES): Vit D, 25-Hydroxy: 30.23 ng/mL (ref 30–100)

## 2019-06-05 LAB — URINALYSIS, ROUTINE W REFLEX MICROSCOPIC
Bilirubin Urine: NEGATIVE
Glucose, UA: NEGATIVE mg/dL
Hgb urine dipstick: NEGATIVE
Ketones, ur: NEGATIVE mg/dL
Leukocytes,Ua: NEGATIVE
Nitrite: NEGATIVE
Protein, ur: NEGATIVE mg/dL
Specific Gravity, Urine: 1.017 (ref 1.005–1.030)
pH: 5 (ref 5.0–8.0)

## 2019-06-05 LAB — RAPID URINE DRUG SCREEN, HOSP PERFORMED
Amphetamines: NOT DETECTED
Barbiturates: NOT DETECTED
Benzodiazepines: NOT DETECTED
Cocaine: NOT DETECTED
Opiates: NOT DETECTED
Tetrahydrocannabinol: NOT DETECTED

## 2019-06-05 LAB — I-STAT CHEM 8, ED
BUN: 18 mg/dL (ref 8–23)
Calcium, Ion: 0.82 mmol/L — CL (ref 1.15–1.40)
Chloride: 112 mmol/L — ABNORMAL HIGH (ref 98–111)
Creatinine, Ser: 0.7 mg/dL (ref 0.61–1.24)
Glucose, Bld: 138 mg/dL — ABNORMAL HIGH (ref 70–99)
HCT: 46 % (ref 39.0–52.0)
Hemoglobin: 15.6 g/dL (ref 13.0–17.0)
Potassium: 5 mmol/L (ref 3.5–5.1)
Sodium: 137 mmol/L (ref 135–145)
TCO2: 24 mmol/L (ref 22–32)

## 2019-06-05 LAB — RESPIRATORY PANEL BY RT PCR (FLU A&B, COVID)
Influenza A by PCR: NEGATIVE
Influenza B by PCR: NEGATIVE
SARS Coronavirus 2 by RT PCR: NEGATIVE

## 2019-06-05 LAB — PHOSPHORUS: Phosphorus: 2.9 mg/dL (ref 2.5–4.6)

## 2019-06-05 LAB — LIPASE, BLOOD: Lipase: 20 U/L (ref 11–51)

## 2019-06-05 LAB — CREATININE, SERUM
Creatinine, Ser: 0.9 mg/dL (ref 0.61–1.24)
GFR calc Af Amer: >60 mL/min (ref >60)
GFR calc non Af Amer: >60 mL/min (ref >60)

## 2019-06-05 LAB — APTT: aPTT: 27 seconds (ref 24–36)

## 2019-06-05 LAB — HEMOGLOBIN A1C
Hgb A1c MFr Bld: 6.7 % — ABNORMAL HIGH (ref 4.8–5.6)
Mean Plasma Glucose: 145.59 mg/dL

## 2019-06-05 LAB — GLUCOSE, CAPILLARY: Glucose-Capillary: 154 mg/dL — ABNORMAL HIGH (ref 70–99)

## 2019-06-05 LAB — CBG MONITORING, ED: Glucose-Capillary: 106 mg/dL — ABNORMAL HIGH (ref 70–99)

## 2019-06-05 LAB — MAGNESIUM: Magnesium: 2.1 mg/dL (ref 1.7–2.4)

## 2019-06-05 LAB — PROTIME-INR
INR: 1.1 (ref 0.8–1.2)
Prothrombin Time: 13.6 seconds (ref 11.4–15.2)

## 2019-06-05 LAB — TSH: TSH: 1.12 u[IU]/mL (ref 0.350–4.500)

## 2019-06-05 LAB — HIV ANTIBODY (ROUTINE TESTING W REFLEX): HIV Screen 4th Generation wRfx: NONREACTIVE

## 2019-06-05 MED ORDER — ENOXAPARIN SODIUM 40 MG/0.4ML ~~LOC~~ SOLN
40.0000 mg | SUBCUTANEOUS | Status: DC
  Administered 2019-06-05 – 2019-06-09 (×5): 40 mg via SUBCUTANEOUS
  Filled 2019-06-05 (×5): qty 0.4

## 2019-06-05 MED ORDER — INSULIN ASPART 100 UNIT/ML ~~LOC~~ SOLN
0.0000 [IU] | Freq: Three times a day (TID) | SUBCUTANEOUS | Status: DC
  Administered 2019-06-06: 09:00:00 1 [IU] via SUBCUTANEOUS
  Administered 2019-06-06: 5 [IU] via SUBCUTANEOUS
  Administered 2019-06-06 – 2019-06-07 (×2): 1 [IU] via SUBCUTANEOUS
  Administered 2019-06-07: 5 [IU] via SUBCUTANEOUS
  Administered 2019-06-07: 1 [IU] via SUBCUTANEOUS
  Administered 2019-06-08: 17:00:00 3 [IU] via SUBCUTANEOUS
  Administered 2019-06-08: 2 [IU] via SUBCUTANEOUS
  Administered 2019-06-08: 5 [IU] via SUBCUTANEOUS
  Administered 2019-06-09 (×2): 2 [IU] via SUBCUTANEOUS
  Administered 2019-06-09: 3 [IU] via SUBCUTANEOUS

## 2019-06-05 MED ORDER — LAMOTRIGINE 100 MG PO TABS
100.0000 mg | ORAL_TABLET | Freq: Two times a day (BID) | ORAL | Status: DC
  Administered 2019-06-05 – 2019-06-09 (×8): 100 mg via ORAL
  Filled 2019-06-05 (×9): qty 1

## 2019-06-05 MED ORDER — AMLODIPINE BESYLATE 10 MG PO TABS
10.0000 mg | ORAL_TABLET | Freq: Every day | ORAL | Status: DC
  Administered 2019-06-05 – 2019-06-09 (×5): 10 mg via ORAL
  Filled 2019-06-05 (×6): qty 1

## 2019-06-05 MED ORDER — BARRIER CREAM NON-SPECIFIED: 1.0000 "application " | TOPICAL_CREAM | Freq: Two times a day (BID) | TOPICAL | Status: DC | PRN

## 2019-06-05 MED ORDER — LISINOPRIL 10 MG PO TABS
10.0000 mg | ORAL_TABLET | Freq: Every day | ORAL | Status: DC
  Administered 2019-06-06 – 2019-06-07 (×2): 10 mg via ORAL
  Filled 2019-06-05 (×3): qty 1

## 2019-06-05 MED ORDER — ASPIRIN 81 MG PO CHEW
81.0000 mg | CHEWABLE_TABLET | Freq: Every day | ORAL | Status: DC
  Administered 2019-06-06 – 2019-06-09 (×4): 81 mg via ORAL
  Filled 2019-06-05 (×5): qty 1

## 2019-06-05 MED ORDER — TAMSULOSIN HCL 0.4 MG PO CAPS
0.4000 mg | ORAL_CAPSULE | Freq: Every day | ORAL | Status: DC
  Administered 2019-06-05 – 2019-06-09 (×5): 0.4 mg via ORAL
  Filled 2019-06-05 (×5): qty 1

## 2019-06-05 MED ORDER — SODIUM CHLORIDE 0.9 % IV SOLN: 100.0000 mL/h | INTRAVENOUS | Status: DC

## 2019-06-05 MED ORDER — SODIUM CHLORIDE 0.9 % IV SOLN: INTRAVENOUS | Status: DC

## 2019-06-05 MED ORDER — ZINC OXIDE 40 % EX OINT
TOPICAL_OINTMENT | Freq: Two times a day (BID) | CUTANEOUS | Status: DC | PRN
  Filled 2019-06-05: qty 57

## 2019-06-05 MED ORDER — SODIUM CHLORIDE 0.9 % IV SOLN
1.0000 g | Freq: Once | INTRAVENOUS | Status: DC
  Filled 2019-06-05: qty 10

## 2019-06-05 MED ORDER — CLOPIDOGREL BISULFATE 75 MG PO TABS
75.0000 mg | ORAL_TABLET | Freq: Every day | ORAL | Status: DC
  Administered 2019-06-06 – 2019-06-09 (×4): 75 mg via ORAL
  Filled 2019-06-05 (×5): qty 1

## 2019-06-05 MED ORDER — ATORVASTATIN CALCIUM 40 MG PO TABS
40.0000 mg | ORAL_TABLET | Freq: Every day | ORAL | Status: DC
  Administered 2019-06-05 – 2019-06-09 (×5): 40 mg via ORAL
  Filled 2019-06-05 (×5): qty 1

## 2019-06-05 MED ORDER — SODIUM CHLORIDE 0.9 % IV BOLUS: 500.0000 mL | Freq: Once | INTRAVENOUS | Status: DC

## 2019-06-05 MED ORDER — METOPROLOL SUCCINATE ER 25 MG PO TB24
25.0000 mg | ORAL_TABLET | Freq: Every day | ORAL | Status: DC
  Administered 2019-06-06 – 2019-06-09 (×4): 25 mg via ORAL
  Filled 2019-06-05 (×6): qty 1

## 2019-06-05 NOTE — H&P (Addendum)
Family Medicine Teaching [facility] Admission History and Physical Service Pager: [phone]  Patient name: Douglas [name] Medical record number: [id number] Date of birth: 1950-08-03 Age: 69 y.o. Gender: male  Primary Care Provider: Patient, No Pcp Per Dr Ralene Ok Consultants: None Code Status: Full Preferred Emergency Contact: Bailey Mech ([email or web address])  Chief Complaint: AMS  Assessment and Plan: Emiliano Welshans is a 69 y.o. male presenting with AMS. PMH is significant for HTN, DM Type II, CVA with Rt hemiplegia  AMS likely secondary to hypocalcemia Differentials include hypocalcemia, new CVA, seizures, electrolyte imbalance.  CVA less likely as CT head negative for new acute intracranial findings compared to previous CT 11/20.  Patient compliant with seizure medication, no reports of seizure like activity and given no LOC or postictal state seizure activity low on differential.  Labs significant for low Ionized Ca 0.82 and daughters reports patient was agitated with rapid movement of Rt arm pointing, likely tetany from hypocalcemia.  Per patients daughter he is back to baseline.On exam patient alert but does not follow commands, repeats "yes". Low concern for cardiac etiology as patient had normal EKG, no chest pain, with normal vital signs. Acid-base imbalance unlikely as he has normal electrolytes except calcium. Hepatic function normal so metabolic encephalopathy unlikely with no history of liver failure. Wells score of 1 with no tachycardia or tachypnea making PE unlikely cause. UTI also considered but U/A was normal. WBC normal and patient has remained afebrile so unlikely to be an acute infectious process. -Admit to Med Surg, Attending Dr. Deirdre Priest -Repeat Ionized Calcium -TSH, PTH, Vit D, Phos, Mag, Lipase -Repeat ECG -Vital and Neuro signs per unit protocol -Cardiac monitoring  H/o Seizure Per daughter, last seizure March 2020.  Compliant with home medication  Lamictal 100 mg BID.  Was taking Keppra but causes aggitation -Seizure precautions -Continue Lamictal   HTN Hypertensive.  BP 157/86.  Home medications Hydralazine 75 mg TID, Norvasc 10 mg daily, Zestril 40 mg BID, Metoprolol Succinate 25 mg daily -Continue home medication -Hold hydralazine -Monitor BP  DM Type II Home medication Glipizide 10 mg BID.  Daughter reports sugars controlled and has not had to give insulin in two weeks. -HbA1c 6.7.  -Hold Glipizide -sss insulin coverage -monitor CBG   H/O  CVA with Rt Hemiplegia Pt at baseline per daughter. Home medication Plavix 75mg  daily and EC ASA  325 mg daily.  CT head negative for acute bleed. -Continue home medication -PT/OT  BPH Stable.  Home medication Flomax 0.4 mg daily -Continue home medication  FEN/GI:  -Dysphagia III diet Prophylaxis: -Lovenox  Disposition: admit to MedSurg, Attending Dr.  History of Present Illness:  Douglas [name] is a 69 y.o. male presenting with AMS  History obtained from Daughter who is POA. Daughter states that this morning during breakfast pt became very agitated and started to drool excessively.  She states he kept pointing at something like he was seeing something.  She states that he kept calling out help me and she thought he needed to use the bathroom and she says that after he voided he seemed to be a little better.  She unsure how long the agitation lasted but seem like a long time before EMS arrived.  She denies any incontinence of bowel or bladder, or generalized body movements.  He is compliant with medications as she takes care of this.  She reports his last seizure was in March 2020 for which he takes Lamictal.  He  is unable to tolerate Keppra as it makes him agitated.  Daughter denies any chest pain, SOB, abdominal pain, nausea or vomiting or difficulty swallowing.  Denies any dysuria, hematuria, diarrhea,constipation or hematochezia.  Has not been around any sick  contacts. She does report a temperature of 100 when EMS checked but states that patient had just had hot tea prior. She reports that he is now back to baseline.  In the ED he was alert and only able to say/answer yes to questions. He was afebrile, BP 157/86, HR 71 and O2 sats 100% on room air.Head CT negative for acute bleed. Labs significant for Ionized Calcium 0.82   Review Of Systems: Per HPI with the following additions:   Review of Systems  Constitutional: Positive for fever. Negative for chills and weight loss.  HENT: Negative for hearing loss and sore throat.   Respiratory: Negative for cough, hemoptysis, shortness of breath and wheezing.   Cardiovascular: Negative for chest pain and leg swelling.  Gastrointestinal: Negative for abdominal pain, blood in stool, constipation, diarrhea, nausea and vomiting.  Genitourinary: Negative for hematuria.  Musculoskeletal: Negative for falls.  Neurological: Negative for seizures and loss of consciousness.    Patient Active Problem List   Diagnosis Date Noted  . Ischemic stroke (HCC) 03/05/2019  . UTI (urinary tract infection) 03/05/2019  . Acute blood loss anemia   . Labile blood pressure   . 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 Labile blood glucose   . Global aphasia   . Cerebral edema (HCC) 04/04/2018  . Renal insufficiency 04/04/2018  . 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 Acute lower UTI   . Type 2 diabetes mellitus with peripheral neuropathy (HCC)   . Acute embolic stroke (HCC)   . Diabetes 1.5, managed as type 2 (HCC)   . Essential hypertension   . Dyslipidemia   . History of CVA with residual deficit   . Dysphagia, post-stroke     Past Medical History: Past Medical History:  Diagnosis Date  . Diabetes mellitus without complication (HCC)   . Hypertension   . Stroke [facility])     Past Surgical History: Past Surgical History:  Procedure Laterality  Date  . IR PATIENT EVAL TECH 0-60 MINS  03/30/2018  . LOOP RECORDER INSERTION N/A 03/08/2019   Procedure: LOOP RECORDER INSERTION;  Surgeon: Regan Lemming, MD;  [facility] INVASIVE CV LAB;  Service: Cardiovascular;  Laterality: N/A;  . TEE WITHOUT CARDIOVERSION N/A 04/02/2018   Procedure: TRANSESOPHAGEAL ECHOCARDIOGRAM (TEE);  Surgeon: [name], MD;  Location: [facility] ENDOSCOPY;  Service: Cardiovascular;  Laterality: N/A;    Social History: Social History   Tobacco Use  . Smoking status: Never Smoker  . Smokeless tobacco: Never Used  Substance Use Topics  . Alcohol use: Not Currently  . Drug use: Never   Additional social history: Lives with Daughter and has Home Aide  Please also refer to relevant sections of EMR.  Family History: Family History  Problem Relation Age of Onset  . Hypertension Mother   . Hypertension Father    (If not completed, MUST add something in)  Allergies and Medications: Allergies  Allergen Reactions  . Ciprofloxacin Anaphylaxis  . Other Nausea And Vomiting and Other (See Comments)    [place]   No current facility-administered medications on file prior to encounter.   Current Outpatient Medications on File Prior to Encounter  Medication Sig Dispense Refill  . ACCU-CHEK  AVIVA PLUS test strip daily.    Marland Kitchen amLODipine (NORVASC) 10 MG tablet Take 1 tablet (10 mg total) by mouth daily. 30 tablet 3  . aspirin EC 325 MG EC tablet Take 1 tablet (325 mg total) by mouth daily. 30 tablet 0  . atorvastatin (LIPITOR) 40 MG tablet Take 1 tablet (40 mg total) by mouth daily at 6 PM.    . baclofen (LIORESAL) 10 MG tablet Take 20 mg by mouth 2 (two) times daily.     . clopidogrel (PLAVIX) 75 MG tablet Take 1 tablet (75 mg total) by mouth daily. 30 tablet 0  . glipiZIDE (GLUCOTROL) 10 MG tablet Take 1 tablet (10 mg total) by mouth 2 (two) times daily. 60 tablet 3  . insulin detemir (LEVEMIR) 100 UNIT/ML injection Inject 0.16 mLs (16 Units total) into the  skin daily.    Marland Kitchen lamoTRIgine (LAMICTAL) 100 MG tablet Take 100 mg by mouth 2 (two) times daily.    Marland Kitchen levETIRAcetam (KEPPRA) 500 MG tablet Take 1 tablet (500 mg total) by mouth 2 (two) times daily. (Patient not taking: Reported on 03/21/2019)    . lisinopril (ZESTRIL) 20 MG tablet Take 20 mg by mouth 2 (two) times daily.    . metoprolol succinate (TOPROL-XL) 25 MG 24 hr tablet Take 25 mg by mouth at bedtime.    Marland Kitchen NOVOLOG FLEXPEN 100 UNIT/ML FlexPen Inject 3 Units into the skin See admin instructions. Inject 3-4 units into the skin up to two times a day before meals as needed for a BGL of 200 or greater- per sliding scale    . pantoprazole (PROTONIX) 40 MG tablet Take 1 tablet (40 mg total) by mouth daily. (Patient not taking: Reported on 03/05/2019) 30 tablet 0  . tamsulosin (FLOMAX) 0.4 MG CAPS capsule Take 0.4 mg by mouth daily after supper.       Objective: BP (!) 147/86   Pulse 78   Temp 99.1 F (37.3 C) (Oral)   Resp 15   Ht 6\' 1"  (1.854 m)   Wt 88.5 kg   SpO2 100%   BMI 25.73 kg/m  Exam: General: Alert and in no apparent distress  Eyes: PEERLA ENTM: mucus membranes moist Neck: nontender Cardiovascular: RRR with no murmurs noted Respiratory: CTA bilaterally. No wheezing or crackles appreciated  Gastrointestinal: Bowel sounds present. No abdominal pain MSK: Rt sided hemiplegia, 3/5 strength in Lt lower extremity, 5/5 strength in Lt upper extremity, no lower extremity edema Derm: redness to sacral area Neuro: does not obey commands. Pt at baseline Psych: Behavior and speech appropriate to situation  Labs and Imaging: CBC BMET  Recent Labs  Lab 06/05/19 1352 06/05/19 1352 06/05/19 1509  WBC 5.1  --   --   HGB 14.3   < > 15.6  HCT 44.4   < > 46.0  PLT 195  --   --    < > = values in this interval not displayed.   Recent Labs  Lab 06/05/19 1352 06/05/19 1352 06/05/19 1509  NA 141   < > 137  K 3.5   < > 5.0  CL 110   < > 112*  CO2 24  --   --   BUN 12   < > 18   CREATININE 0.77   < > 0.70  GLUCOSE 161*   < > 138*  CALCIUM 9.2  --   --    < > = values in this interval not displayed.     EKG: Vent.  rate 77 BPM PR interval * ms QRS duration 102 ms QT/QTc 385/436 ms P-R-T axes 58 62 27   CT HEAD WO CONTRAST  Result Date: 06/05/2019 CLINICAL DATA:  Increased altered mental status starting this morning. Prior stroke. EXAM: CT HEAD WITHOUT CONTRAST TECHNIQUE: Contiguous axial images were obtained from the base of the skull through the vertex without intravenous contrast. COMPARISON:  Multiple exams, including CT and MRI examinations from 03/05/2019 and 03/07/2019 FINDINGS: Brain: Large previous left PCA and MCA strokes with associated encephalomalacia. Distribution appears grossly similar to the 04/01/2019 exam. Periventricular white matter and corona radiata hypodensities favor chronic ischemic microvascular white matter disease. Remote small lacunar infarct of the left pons. Expected Wallerian degeneration. No intracranial hemorrhage, mass lesion, or acute CVA. Vascular: There is atherosclerotic calcification of the cavernous carotid arteries bilaterally. Skull: Unremarkable Sinuses/Orbits: Unremarkable Other: No supplemental non-categorized findings. IMPRESSION: 1. No acute intracranial findings. 2. Large previous left PCA and MCA strokes with associated encephalomalacia. 3. Periventricular white matter and corona radiata hypodensities favor chronic ischemic microvascular white matter disease. Remote small lacunar infarct of the left pons. Electronically Signed   By: Gaylyn Rong M.D.   On: 06/05/2019 16:06    Dana Allan, MD 06/05/2019, 4:00 PM PGY-1, [facility] Family Medicine FPTS Intern pager: [phone], text pages welcome  Resident Attestation   I saw and evaluated the patient, performing the key elements of the service.I  personally performed or re-performed the history, physical exam, and medical decision making activities of this service  and have verified that the service and findings are accurately documented in the resident's note. I developed the management plan that is described in the resident's note, and I agree with the content, with my edits above in red.   Jules Schick, DO [facility], PGY-3

## 2019-06-05 NOTE — ED Provider Notes (Addendum)
[facility] EMERGENCY DEPARTMENT Provider Note   CSN: [id number] Arrival date & time: 06/05/19  1257     History No chief complaint on file.   Douglas [name] is a 69 y.o. male.  HPI     Presents via EMS with concern for altered mental status. Initially patient was alone, at his baseline diminished interactivity, prior strokes inhibit substantial historical details.  Level 5 caveat secondary to cognitive status.  On EMS reports the patient has history of prior stroke, has baseline right-sided hemiplegia, minimal interactivity, speech. Seemingly the patient became abnormal earlier in the day, though exact time of onset is unclear. Some suspicion for change occurring 2 hours prior to ED arrival, but possibly it has been longer. No EMS report of hemodynamic instability.  Past Medical History:  Diagnosis Date  . Diabetes mellitus without complication (HCC)   . Hypertension   . Stroke [facility])     Patient Active Problem List   Diagnosis Date Noted  . Ischemic stroke (HCC) 03/05/2019  . UTI (urinary tract infection) 03/05/2019  . Acute blood loss anemia   . Labile blood pressure   . 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 Labile blood glucose   . Global aphasia   . Cerebral edema (HCC) 04/04/2018  . Renal insufficiency 04/04/2018  . 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 Acute lower UTI   . Type 2 diabetes mellitus with peripheral neuropathy (HCC)   . Acute embolic stroke (HCC)   . Diabetes 1.5, managed as type 2 (HCC)   . Essential hypertension   . Dyslipidemia   . History of CVA with residual deficit   . Dysphagia, post-stroke     Past Surgical History:  Procedure Laterality Date  . IR PATIENT EVAL TECH 0-60 MINS  03/30/2018  . LOOP RECORDER INSERTION N/A 03/08/2019   Procedure: LOOP RECORDER INSERTION;  Surgeon: Regan Lemming, MD;  Location: [facility] INVASIVE CV LAB;  Service:  Cardiovascular;  Laterality: N/A;  . TEE WITHOUT CARDIOVERSION N/A 04/02/2018   Procedure: TRANSESOPHAGEAL ECHOCARDIOGRAM (TEE);  Surgeon: [name], MD;  Location: [facility] ENDOSCOPY;  Service: Cardiovascular;  Laterality: N/A;       Family History  Problem Relation Age of Onset  . Hypertension Mother   . Hypertension Father     Social History   Tobacco Use  . Smoking status: Never Smoker  . Smokeless tobacco: Never Used  Substance Use Topics  . Alcohol use: Not Currently  . Drug use: Never    Home Medications Prior to Admission medications   Medication Sig Start Date End Date Taking? Authorizing Provider  ACCU-CHEK AVIVA PLUS test strip daily. 12/28/18   [provider]  amLODipine (NORVASC) 10 MG tablet Take 1 tablet (10 mg total) by mouth daily. 05/04/18   Angiulli, Mcarthur Rossetti, PA-C  aspirin EC 325 MG EC tablet Take 1 tablet (325 mg total) by mouth daily. 04/04/18   Layne Benton, NP  atorvastatin (LIPITOR) 40 MG tablet Take 1 tablet (40 mg total) by mouth daily at 6 PM. 03/12/19   Rhetta Mura, MD  baclofen (LIORESAL) 10 MG tablet Take 20 mg by mouth 2 (two) times daily.  12/13/18   [provider]  clopidogrel (PLAVIX) 75 MG tablet Take 1 tablet (75 mg total) by mouth daily. 05/04/18   Angiulli, Mcarthur Rossetti, PA-C  glipiZIDE (GLUCOTROL) 10 MG tablet Take 1 tablet (10 mg total) by  mouth 2 (two) times daily. 05/04/18   Angiulli, Lavon Paganini, PA-C  insulin detemir (LEVEMIR) 100 UNIT/ML injection Inject 0.16 mLs (16 Units total) into the skin daily. 03/12/19   Nita Sells, MD  lamoTRIgine (LAMICTAL) 100 MG tablet Take 100 mg by mouth 2 (two) times daily. 11/18/18   [provider]  levETIRAcetam (KEPPRA) 500 MG tablet Take 1 tablet (500 mg total) by mouth 2 (two) times daily. Patient not taking: Reported on 03/21/2019 03/12/19   Nita Sells, MD  lisinopril (ZESTRIL) 20 MG tablet Take 20 mg by mouth 2 (two) times daily. 11/18/18   [provider]  metoprolol succinate (TOPROL-XL) 25 MG 24 hr tablet Take 25 mg by mouth at bedtime.    [provider]  NOVOLOG FLEXPEN 100 UNIT/ML FlexPen Inject 3 Units into the skin See admin instructions. Inject 3-4 units into the skin up to two times a day before meals as needed for a BGL of 200 or greater- per sliding scale 10/01/18   [provider]  pantoprazole (PROTONIX) 40 MG tablet Take 1 tablet (40 mg total) by mouth daily. Patient not taking: Reported on 03/05/2019 05/04/18   Angiulli, Lavon Paganini, PA-C  tamsulosin (FLOMAX) 0.4 MG CAPS capsule Take 0.4 mg by mouth daily after supper.  09/30/18   [provider]    Allergies    Ciprofloxacin and Other  Review of Systems   Review of Systems  Unable to perform ROS: Patient nonverbal    Physical Exam Updated Vital Signs BP (!) 152/88 (BP Location: Left Arm)   Pulse 90   Temp 99.1 F (37.3 C) (Oral)   Resp 16   Ht 6\' 1"  (1.854 m)   Wt 88.5 kg   SpO2 99%   BMI 25.73 kg/m   Physical Exam Vitals and nursing note reviewed.  Constitutional:      General: He is in acute distress.     Appearance: He is well-developed.     Comments: Unwell appearing thin adult male repeating the words yes, moving his head spontaneously  HENT:     Head: Normocephalic and atraumatic.  Eyes:     Conjunctiva/sclera: Conjunctivae normal.  Cardiovascular:     Rate and Rhythm: Normal rate and regular rhythm.  Pulmonary:     Effort: Pulmonary effort is normal. No respiratory distress.     Breath sounds: No stridor.  Abdominal:     General: There is no distension.  Musculoskeletal:     Comments: Substantial atrophy, right side, upper extremity and contraction no gross deformities.  Skin:    General: Skin is warm and dry.  Neurological:     Comments: Patient inconsistently interactive, answering some questions with the word yes, repetitively. He does move the left upper and lower extremities spontaneously, and occasionally  to command. Right side with contraction, no appropriate neurologic function.  Psychiatric:        Cognition and Memory: Cognition is impaired.     ED Results / Procedures / Treatments   Labs (all labs ordered are listed, but only abnormal results are displayed) Labs Reviewed  COMPREHENSIVE METABOLIC PANEL - Abnormal; Notable for the following components:      Result Value   Glucose, Bld 161 (*)    AST 13 (*)    All other components within normal limits  I-STAT CHEM 8, ED - Abnormal; Notable for the following components:   Chloride 112 (*)    Glucose, Bld 138 (*)    Calcium, Ion 0.82 (*)  All other components within normal limits  PROTIME-INR  APTT  CBC  DIFFERENTIAL  RAPID URINE DRUG SCREEN, HOSP PERFORMED  URINALYSIS, ROUTINE W REFLEX MICROSCOPIC    EKG EKG Interpretation  Date/Time:  Wednesday June 05 2019 13:08:32 EST Ventricular Rate:  85 PR Interval:    QRS Duration: 91 QT Interval:  380 QTC Calculation: 452 R Axis:   62 Text Interpretation: Sinus rhythm Borderline T wave abnormalities Artifact Baseline wander Abnormal ECG Confirmed by Gerhard Munch [phone]) on 06/05/2019 1:21:18 PM   Radiology CT HEAD WO CONTRAST  Result Date: 06/05/2019 CLINICAL DATA:  Increased altered mental status starting this morning. Prior stroke. EXAM: CT HEAD WITHOUT CONTRAST TECHNIQUE: Contiguous axial images were obtained from the base of the skull through the vertex without intravenous contrast. COMPARISON:  Multiple exams, including CT and MRI examinations from 03/05/2019 and 03/07/2019 FINDINGS: Brain: Large previous left PCA and MCA strokes with associated encephalomalacia. Distribution appears grossly similar to the 04/01/2019 exam. Periventricular white matter and corona radiata hypodensities favor chronic ischemic microvascular white matter disease. Remote small lacunar infarct of the left pons. Expected Wallerian degeneration. No intracranial hemorrhage, mass lesion, or acute  CVA. Vascular: There is atherosclerotic calcification of the cavernous carotid arteries bilaterally. Skull: Unremarkable Sinuses/Orbits: Unremarkable Other: No supplemental non-categorized findings. IMPRESSION: 1. No acute intracranial findings. 2. Large previous left PCA and MCA strokes with associated encephalomalacia. 3. Periventricular white matter and corona radiata hypodensities favor chronic ischemic microvascular white matter disease. Remote small lacunar infarct of the left pons. Electronically Signed   By: Gaylyn Rong M.D.   On: 06/05/2019 16:06    Procedures Procedures (including critical care time)  Medications Ordered in ED Medications  sodium chloride 0.9 % bolus 500 mL (has no administration in time range)    Followed by  0.9 %  sodium chloride infusion (has no administration in time range)  calcium gluconate 1 g in sodium chloride 0.9 % 100 mL IVPB (has no administration in time range)    ED Course  I have reviewed the triage vital signs and the nursing notes.  Pertinent labs & imaging results that were available during my care of the patient were reviewed by me and considered in my medical decision making (see chart for details).   After the initial evaluation patient's daughter arrived. She notes that the patient had been in his usual state of health until today.  Again, exact change time unclear but changes were more pronounced after about 11 AM today. She notes that he has had agitation, irritability, change in speech pattern since that time.  No clear change in physical activity. No recent illness, no recent change medication, diet.  Update:, Initial labs notable for normal serum calcium, but critically abnormal ionized calcium, which may be contributing to the patient's change in mental status.   4:12 PM Patient in similar condition.  On CT interpreted by me, and radiology, no acute intracranial finding, demonstration of substantial encephalomalacia. This  adult male with decreased cognitive capacity, prior stroke presents with new change in mentation Patient is found to have critically abnormal calcium value requiring repletion, admission  covid pending as well Final Clinical Impression(s) / ED Diagnoses Final diagnoses:  Delirium  Hypocalcemia     Gerhard Munch, MD 06/05/19 1523    Gerhard Munch, MD 06/05/19 [phone]

## 2019-06-05 NOTE — ED Notes (Signed)
Daughter states patient drank water with her without episodes of choking.  Does take his pills at home without any difficulty.  MD aware.

## 2019-06-05 NOTE — ED Triage Notes (Signed)
Presents with reported increased AMS since 1145 AM.  Hx of previous stroke and significant R side weakness and UE contractures. Pt aphasic at baseine and cannot answer questions.   Answers "yes" to every question.  EMS report that the sx different at home were increased confusion and drooling noted, facial droop.

## 2019-06-05 NOTE — ED Notes (Signed)
Daughter here and states that her father had an episode of yelling out then laughing and drooling.  The episodes frightened her as this was new and called EMS.

## 2019-06-06 ENCOUNTER — Other Ambulatory Visit: Payer: Self-pay

## 2019-06-06 ENCOUNTER — Encounter (HOSPITAL_COMMUNITY): Payer: Self-pay | Admitting: Family Medicine

## 2019-06-06 DIAGNOSIS — R410 Disorientation, unspecified: Secondary | ICD-10-CM

## 2019-06-06 DIAGNOSIS — R404 Transient alteration of awareness: Secondary | ICD-10-CM

## 2019-06-06 LAB — COMPREHENSIVE METABOLIC PANEL
ALT: 21 U/L (ref 0–44)
AST: 24 U/L (ref 15–41)
Albumin: 4 g/dL (ref 3.5–5.0)
Alkaline Phosphatase: 97 U/L (ref 38–126)
Anion gap: 17 — ABNORMAL HIGH (ref 5–15)
BUN: 13 mg/dL (ref 8–23)
CO2: 23 mmol/L (ref 22–32)
Calcium: 9.6 mg/dL (ref 8.9–10.3)
Chloride: 102 mmol/L (ref 98–111)
Creatinine, Ser: 0.92 mg/dL (ref 0.61–1.24)
GFR calc Af Amer: >60 mL/min (ref >60)
GFR calc non Af Amer: >60 mL/min (ref >60)
Glucose, Bld: 140 mg/dL — ABNORMAL HIGH (ref 70–99)
Potassium: 4.6 mmol/L (ref 3.5–5.1)
Sodium: 142 mmol/L (ref 135–145)
Total Bilirubin: 1.3 mg/dL — ABNORMAL HIGH (ref 0.3–1.2)
Total Protein: 6.7 g/dL (ref 6.5–8.1)

## 2019-06-06 LAB — CBC
HCT: 47.8 % (ref 39.0–52.0)
Hemoglobin: 15.5 g/dL (ref 13.0–17.0)
MCH: 29.8 pg (ref 26.0–34.0)
MCHC: 32.4 g/dL (ref 30.0–36.0)
MCV: 91.7 fL (ref 80.0–100.0)
Platelets: 182 10*3/uL (ref 150–400)
RBC: 5.21 MIL/uL (ref 4.22–5.81)
RDW: 13.1 % (ref 11.5–15.5)
WBC: 6.6 10*3/uL (ref 4.0–10.5)
nRBC: 0 % (ref 0.0–0.2)

## 2019-06-06 LAB — GLUCOSE, CAPILLARY
Glucose-Capillary: 143 mg/dL — ABNORMAL HIGH (ref 70–99)
Glucose-Capillary: 280 mg/dL — ABNORMAL HIGH (ref 70–99)
Glucose-Capillary: 148 mg/dL — ABNORMAL HIGH (ref 70–99)
Glucose-Capillary: 176 mg/dL — ABNORMAL HIGH (ref 70–99)

## 2019-06-06 LAB — PTH, INTACT AND CALCIUM
Calcium, Total (PTH): 9.3 mg/dL (ref 8.6–10.2)
PTH: 33 pg/mL (ref 15–65)

## 2019-06-06 MED ORDER — CALCIUM CITRATE 950 (200 CA) MG PO TABS
500.0000 mg | ORAL_TABLET | Freq: Every day | ORAL | Status: DC
  Administered 2019-06-06 – 2019-06-09 (×4): 475 mg via ORAL
  Filled 2019-06-06 (×4): qty 1

## 2019-06-06 MED ORDER — VITAMIN D 25 MCG (1000 UNIT) PO TABS
500.0000 [IU] | ORAL_TABLET | Freq: Every day | ORAL | Status: DC
  Administered 2019-06-06 – 2019-06-09 (×4): 500 [IU] via ORAL
  Filled 2019-06-06 (×5): qty 1

## 2019-06-06 MED ORDER — HYDRALAZINE HCL 50 MG PO TABS
50.0000 mg | ORAL_TABLET | Freq: Three times a day (TID) | ORAL | Status: DC
  Administered 2019-06-06 – 2019-06-09 (×11): 50 mg via ORAL
  Filled 2019-06-06 (×11): qty 1

## 2019-06-06 MED ORDER — CALCIUM CITRATE-VITAMIN D 500-500 MG-UNIT PO CHEW
1.0000 | CHEWABLE_TABLET | Freq: Every day | ORAL | Status: DC
  Filled 2019-06-06 (×2): qty 1

## 2019-06-06 NOTE — Progress Notes (Signed)
Family Medicine Teaching Service Daily Progress Note Intern Pager: [phone]  Patient name: Douglas [name] Medical record number: [id number] Date of birth: 12-30-1950 Age: 69 y.o. Gender: male  Primary Care Provider: Patient, No Pcp Per Consultants: None Code Status: Full  Pt Overview and Major Events to Date:  01/03-admitted  Assessment and Plan: Douglas [name] is a 69 y.o. male presenting with AMS. PMH is significant for HTN, DM Type II, CVA with Rt hemiplegia  AMS likely secondary to hypocalcemia Vital signs stable overnight.  No seizure activity.  Patient now back at baseline. -CMP and ionized calcium pending -Monitor vital signs per unit protocol -Cardiac monitoring  H/o Seizure No seizure activity overnight. -Continue Lamictal 100 mg twice daily -Continue seizure precautions  HTN Last recorded BP 151/78 -Continue Norvasc 10 mg daily -Continue Zestril 40 mg twice daily -Continue metoprolol succinate 25 mg daily -Restart hydralazine 50 mg 3 times daily -Monitor blood pressure  DM Type II-HbA1c 6.7 CBGs overnight 143-154.  Did not require any insulin coverage -Continue to hold glipizide -Continue sensitive sliding scale insulin coverage -CBG monitoring  H/O CVA with Rt Hemiplegia Pt at baseline per daughter. Home medication Plavix 75mg  daily and EC ASA  325 mg daily.  CT head negative for acute bleed. -Continue home medication -PT/OT  BPH Stable.  Home medication Flomax 0.4 mg daily -Continue home medication  FEN/GI:  -Dysphagia III diet Prophylaxis: -Lovenox  Disposition: PT/OT recommends SNF, daughter agreeable to SNF  Subjective:  No acute events overnight.  Patient back at baseline.  Currently working with PT.  RN reports no issues.  Objective: Temp:  [97.9 F (36.6 C)-99.1 F (37.3 C)] 98.5 F (36.9 C) (02/03 2027) Pulse Rate:  [71-90] 81 (02/03 2027) Resp:  [9-106] 16 (02/03 1904) BP: (135-185)/(78-123) 151/78 (02/03  2027) SpO2:  [99 %-100 %] 100 % (02/03 2027) Weight:  [88.5 kg] 88.5 kg (02/03 1328)   Physical Exam: General: 69 year old male in no apparent distress Cardiovascular: Regular rate and rhythm, no murmurs appreciated Respiratory: Chest clear to auscultation bilaterally, no wheezes, no crackles Abdomen: Soft, nontender, nondistended, bowel sounds present Extremities: Right hemiplegia from old CVA, 5-5 strength left upper arm.  No lower extremity edema. Neuro: Alert and does not obey commands.  Repeats the word "yes"   Laboratory: Recent Labs  Lab 06/05/19 1352 06/05/19 1352 06/05/19 1509 06/05/19 1701 06/06/19 0441  WBC 5.1  --   --  5.8 6.6  HGB 14.3   < > 15.6 14.7 15.5  HCT 44.4   < > 46.0 45.3 47.8  PLT 195  --   --  200 182   < > = values in this interval not displayed.   Recent Labs  Lab 06/05/19 1352 06/05/19 1509 06/05/19 1822  NA 141 137  --   K 3.5 5.0  --   CL 110 112*  --   CO2 24  --   --   BUN 12 18  --   CREATININE 0.77 0.70 0.90  CALCIUM 9.2  --   --   PROT 6.7  --   --   BILITOT 1.1  --   --   ALKPHOS 83  --   --   ALT 21  --   --   AST 13*  --   --   GLUCOSE 161* 138*  --       Imaging/Diagnostic Tests: CT HEAD WO CONTRAST  Result Date: 06/05/2019 CLINICAL DATA:  Increased altered mental status  starting this morning. Prior stroke. EXAM: CT HEAD WITHOUT CONTRAST TECHNIQUE: Contiguous axial images were obtained from the base of the skull through the vertex without intravenous contrast. COMPARISON:  Multiple exams, including CT and MRI examinations from 03/05/2019 and 03/07/2019 FINDINGS: Brain: Large previous left PCA and MCA strokes with associated encephalomalacia. Distribution appears grossly similar to the 04/01/2019 exam. Periventricular white matter and corona radiata hypodensities favor chronic ischemic microvascular white matter disease. Remote small lacunar infarct of the left pons. Expected Wallerian degeneration. No intracranial hemorrhage,  mass lesion, or acute CVA. Vascular: There is atherosclerotic calcification of the cavernous carotid arteries bilaterally. Skull: Unremarkable Sinuses/Orbits: Unremarkable Other: No supplemental non-categorized findings. IMPRESSION: 1. No acute intracranial findings. 2. Large previous left PCA and MCA strokes with associated encephalomalacia. 3. Periventricular white matter and corona radiata hypodensities favor chronic ischemic microvascular white matter disease. Remote small lacunar infarct of the left pons. Electronically Signed   By: Gaylyn Rong M.D.   On: 06/05/2019 16:06    Dana Allan, MD 06/06/2019, 6:08 AM PGY-1, [facility] Family Medicine FPTS Intern pager: [phone], text pages welcome

## 2019-06-06 NOTE — Evaluation (Signed)
Physical Therapy Evaluation Patient Details Name: Douglas [name] MRN: [id number] DOB: 03-Aug-1950 Today's Date: 06/06/2019   History of Present Illness  69 yo admitted with AMS with yes/no responses and pointing, head CT(-). PMhx: HTN, DM, CVA with Rt hemiplegia  Clinical Impression  Pt awake and laughing during session when jokes made but otherwise answering yes to all questions. Pt with RT hemiplegia with increased tone of flexion RUE and extension RLE. Pt pointing during session to urinal and able to position penis but did not void. Pt currently requiring max +2 assist for bed mobility, transition to EOB and use of stedy to transfer to chair. SNF recommended unless daughter able to provide this level of physical assist for home. If she desires home hoyer lift may be beneficial to decrease caregiver burden. Pt with decreased strength, function, balance, communication and cognition who will benefit from acute therapy to maximize mobility and function to decrease fall risk and burden of care.     Follow Up Recommendations SNF;Supervision/Assistance - 24 hour    Equipment Recommendations  Other (comment)(hoyer lift)    Recommendations for Other Services       Precautions / Restrictions Precautions Precautions: Fall Precaution Comments: high tone in RUE and RLE Restrictions Weight Bearing Restrictions: No      Mobility  Bed Mobility Overal bed mobility: Needs Assistance Bed Mobility: Rolling;Supine to Sit Rolling: Max assist;+2 for physical assistance   Supine to sit: Max assist;+2 for physical assistance     General bed mobility comments: extensor tone when up to EOB first time with posterior bias and max +2 to transition; second time supine to sit still extensor tone but less with mod +2. Rolling bil for pericare due to incontinent bowel and bladder with clothing soaked  Transfers Overall transfer level: Needs assistance   Transfers: Sit to/from Stand;Stand Pivot  Transfers Sit to Stand: Max assist;+2 physical assistance Stand pivot transfers: Max assist;+2 physical assistance       General transfer comment: pt able to stand with 2 person assist with left knee blocked and LUE support however pt with posterior lean and lack of command following with crouched posture maintained. 2nd stand utilized Stedy with pt able to pivot with stedy once in standing  Ambulation/Gait             General Gait Details: unable  Stairs            Wheelchair Mobility    Modified Rankin (Stroke Patients Only)       Balance Overall balance assessment: Needs assistance Sitting-balance support: Single extremity supported;Feet supported Sitting balance-Leahy Scale: Poor Sitting balance - Comments: varied from zero to fair   Standing balance support: Single extremity supported Standing balance-Leahy Scale: Zero Standing balance comment: use of stedy and physical assist to maintain standing                             Pertinent Vitals/Pain Pain Assessment: No/denies pain    Home Living Family/patient expects to be discharged to:: Private residence Living Arrangements: Children Available Help at Discharge: Family;Available 24 hours/day;Other (Comment);Personal care attendant Type of Home: Apartment Home Access: Level entry     Home Layout: One level Home Equipment: Walker - 2 wheels;Bedside commode;Cane - single point;Shower seat;Wheelchair - manual;Hospital bed;Other (comment)      Prior Function Level of Independence: Needs assistance   Gait / Transfers Assistance Needed: requires assist to transfer to [facility]  ADL's /  Homemaking Assistance Needed: Requires assist from [place] for ADLs  Comments: Most of PLOF and home taken from chart from 3 months ago     Hand Dominance   Dominant Hand: Right    Extremity/Trunk Assessment   Upper Extremity Assessment Upper Extremity Assessment: Defer to OT evaluation RUE Deficits / Details:  flexor tone with tightness throughout; no AROM noted, PROM limited (pillow between arm and body RUE Coordination: decreased fine motor;decreased gross motor    Lower Extremity Assessment Lower Extremity Assessment: RLE deficits/detail RLE Deficits / Details: increased tone with difficulty flexing hip and knee for EOB, no active movement throughout session       Communication   Communication: Expressive difficulties;Receptive difficulties  Cognition Arousal/Alertness: Awake/alert Behavior During Therapy: Flat affect Overall Cognitive Status: No family/caregiver present to determine baseline cognitive functioning                                 General Comments: pt responding yes to all questions even inappropriately. pt continues pointing at items today particularly urinal but then did not void when given urinal several times during session      General Comments      Exercises     Assessment/Plan    PT Assessment Patient needs continued PT services  PT Problem List Decreased strength;Decreased mobility;Decreased safety awareness;Decreased range of motion;Decreased activity tolerance;Decreased balance;Decreased knowledge of use of DME;Decreased cognition       PT Treatment Interventions DME instruction;Therapeutic exercise;Wheelchair mobility training;Balance training;Functional mobility training;Cognitive remediation;Therapeutic activities;Patient/family education    PT Goals (Current goals can be found in the Care Plan section)  Acute Rehab PT Goals Patient Stated Goal: unable--he did keep pointing to and trying to use urinal PT Goal Formulation: Patient unable to participate in goal setting Time For Goal Achievement: 06/20/19 Potential to Achieve Goals: Fair    Frequency Min 3X/week   Barriers to discharge Decreased caregiver support unsure how much support daughter can provide at home    Co-evaluation PT/OT/SLP Co-Evaluation/Treatment: Yes Reason for  Co-Treatment: Complexity of the patient's impairments (multi-system involvement);For patient/therapist safety PT goals addressed during session: Mobility/safety with mobility;Balance;Proper use of DME OT goals addressed during session: Strengthening/ROM       AM-PAC PT "6 Clicks" Mobility  Outcome Measure Help needed turning from your back to your side while in a flat bed without using bedrails?: Total Help needed moving from lying on your back to sitting on the side of a flat bed without using bedrails?: Total Help needed moving to and from a bed to a chair (including a wheelchair)?: Total Help needed standing up from a chair using your arms (e.g., wheelchair or bedside chair)?: Total Help needed to walk in hospital room?: Total Help needed climbing 3-5 steps with a railing? : Total 6 Click Score: 6    End of Session Equipment Utilized During Treatment: Gait belt Activity Tolerance: Patient tolerated treatment well Patient left: in chair;with call bell/phone within reach;with chair alarm set Nurse Communication: Mobility status;Need for lift equipment;Precautions PT Visit Diagnosis: Other abnormalities of gait and mobility (R26.89);Unsteadiness on feet (R26.81);Muscle weakness (generalized) (M62.81);Other symptoms and signs involving the nervous system (R29.898)    Time: 5638-7564 PT Time Calculation (min) (ACUTE ONLY): 31 min   Charges:   PT Evaluation $PT Eval Moderate Complexity: [place], PT Acute Rehabilitation Services Pager: [phone] Office: [place]  Douglas [name] Foot 06/06/2019, 12:06 PM

## 2019-06-06 NOTE — Plan of Care (Signed)

## 2019-06-06 NOTE — Evaluation (Signed)
Occupational Therapy Evaluation Patient Details Name: Douglas [name] MRN: [id number] DOB: 12/20/1950 Today's Date: 06/06/2019    History of Present Illness 69 yo admitted with AMS with yes/no responses and pointing, head CT(-). PMhx: HTN, DM, CVA with Rt hemiplegia   Clinical Impression   This 69 yo male admitted with above presents to acute OT with flexor tone in RUE and extensor tone in RLE, decreased communication (expressive and receptive), decreased balance and mobility all affecting his ability to A with basic ADLs. Baseline level of function unknown other than MD in room reported that daughter said patient had just started HHPT. He will benefit from acute OT with follow up as below depending on A at home.     Follow Up Recommendations  Home health OT;Supervision/Assistance - 24 hour;Other (comment)(unless pt is more care now, then SNF would be appropriate)    Equipment Recommendations  Hospital bed;Other (comment)(hoyer lift, if pt does not have either and family wants)       Precautions / Restrictions Precautions Precautions: Fall Precaution Comments: high tone in RUE and RLE Restrictions Weight Bearing Restrictions: No      Mobility Bed Mobility Overal bed mobility: Needs Assistance Bed Mobility: Rolling;Supine to Sit Rolling: Max assist;+2 for physical assistance   Supine to sit: Max assist;+2 for physical assistance     General bed mobility comments: extensor tone when up to EOB first time; second time still extensor tone but less  Transfers Overall transfer level: Needs assistance   Transfers: Sit to/from Stand;Stand Pivot Transfers Sit to Stand: Max assist;+2 physical assistance Stand pivot transfers: Max assist;+2 physical assistance       General transfer comment: use of sara stedy to transfer    Balance Overall balance assessment: Needs assistance Sitting-balance support: Single extremity supported;Feet supported   Sitting balance - Comments:  varied from zero to fair   Standing balance support: Single extremity supported Standing balance-Leahy Scale: Poor                             ADL either performed or assessed with clinical judgement   ADL Overall ADL's : Needs assistance/impaired                                       General ADL Comments: total A; however pt can place urinal intermittently by himself other times he needs A     Vision Baseline Vision/History: (unknown)              Pertinent Vitals/Pain Pain Assessment: No/denies pain     Hand Dominance Right   Extremity/Trunk Assessment Upper Extremity Assessment Upper Extremity Assessment: RUE deficits/detail RUE Deficits / Details: flexor tone with tightness throughout; no AROM noted, PROM limited (pillow between arm and body RUE Coordination: decreased fine motor;decreased gross motor           Communication Communication Communication: Expressive difficulties;Receptive difficulties   Cognition Arousal/Alertness: Awake/alert Behavior During Therapy: Flat affect                                   General Comments: History of cogntive impairments at baseline, not sure if same or different now--no family available in room              Home Living Family/patient expects  to be discharged to:: Private residence Living Arrangements: Children Available Help at Discharge: Family;Available 24 hours/day;Other (Comment);Personal care attendant   Home Access: Level entry     Home Layout: One level               Home Equipment: Walker - 2 wheels;Bedside commode;Cane - single point;Shower seat;Wheelchair - manual;Hospital bed;Other (comment)          Prior Functioning/Environment Level of Independence: Needs assistance  Gait / Transfers Assistance Needed: requires assist to transfer to [facility] ADL's / Homemaking Assistance Needed: Requires assist from [place] for ADLs   Comments: Most of PLOF and home  taken from chart from 3 months ago        OT Problem List: Decreased strength;Decreased range of motion;Impaired balance (sitting and/or standing);Impaired UE functional use;Decreased cognition;Decreased coordination;Decreased safety awareness;Impaired tone      OT Treatment/Interventions: Self-care/ADL training;DME and/or AE instruction;Patient/family education;Balance training;Therapeutic activities;Therapeutic exercise;Neuromuscular education    OT Goals(Current goals can be found in the care plan section) Acute Rehab OT Goals Patient Stated Goal: unable--he did keep pointing to and trying to use urinal OT Goal Formulation: Patient unable to participate in goal setting Time For Goal Achievement: 06/20/19 Potential to Achieve Goals: Fair  OT Frequency: Min 2X/week           Co-evaluation PT/OT/SLP Co-Evaluation/Treatment: Yes Reason for Co-Treatment: For patient/therapist safety PT goals addressed during session: Mobility/safety with mobility;Balance;Proper use of DME;Strengthening/ROM OT goals addressed during session: Strengthening/ROM      AM-PAC OT "6 Clicks" Daily Activity     Outcome Measure Help from another person eating meals?: Total Help from another person taking care of personal grooming?: Total Help from another person toileting, which includes using toliet, bedpan, or urinal?: Total Help from another person bathing (including washing, rinsing, drying)?: Total Help from another person to put on and taking off regular upper body clothing?: Total Help from another person to put on and taking off regular lower body clothing?: Total 6 Click Score: 6   End of Session Equipment Utilized During Treatment: Gait belt Nurse Communication: Mobility status;Need for lift equipment(sara stedy)  Activity Tolerance: Patient tolerated treatment well Patient left: in chair;with call bell/phone within reach;with chair alarm set  OT Visit Diagnosis: Unsteadiness on feet  (R26.81);Other abnormalities of gait and mobility (R26.89);Other symptoms and signs involving cognitive function;Cognitive communication deficit (R41.841);Hemiplegia and hemiparesis Symptoms and signs involving cognitive functions: Cerebral infarction(old) Hemiplegia - Right/Left: Right Hemiplegia - dominant/non-dominant: Dominant Hemiplegia - caused by: Cerebral infarction(old)                Time: 2952-8413 OT Time Calculation (min): 33 min Charges:  OT General Charges $OT Visit: 1 Visit OT Evaluation $OT Eval Moderate Complexity: 1 Mod  Cathy  OTR/L Acute [employer] Pager [phone] Office [phone]     06/06/2019, 9:36 AM

## 2019-06-06 NOTE — TOC Initial Note (Signed)
Transition of Care [facility]) - Initial/Assessment Note    Patient Details  Name: Douglas [name] MRN: [id number] Date of Birth: [birth year]  [facility] CM/SW Contact:    Terrilee Croak, Student-Social Work Phone Number: 06/06/2019, 3:25 PM  Clinical Narrative:                 MSW Intern spoke with daughter to touch base about pt's discharge options. Daughter noted that pt had been at [facility] for two days before taking him home, because he had been on Keppra, and was having behavioral issues. He has been living at home with her for a few months, she stated that she would need for him to get some rehab before he can come back with her. She says she has an aid for a few hours a day and DME. Daughter would like for him to return to [facility] if possible. Workup completed, [facility] contacted. SW will continue to follow.   Expected Discharge Plan: Skilled Nursing Facility Barriers to Discharge: Continued Medical Work up   Patient Goals and CMS Choice Patient states their goals for this hospitalization and ongoing recovery are:: Pt unable to participate in goal setting due to disorientation. CMS Medicare.gov Compare Post Acute Care list provided to:: Patient Represenative (must comment) Choice offered to / list presented to : Adult Children  Expected Discharge Plan and Services Expected Discharge Plan: Skilled Nursing Facility     Post Acute Care Choice: Skilled Nursing Facility Living arrangements for the past 2 months: Single Family Home                                      Prior Living Arrangements/Services Living arrangements for the past 2 months: Single Family Home Lives with:: Adult Children Patient language and need for interpreter reviewed:: Yes Do you feel safe going back to the place where you live?: Yes      Need for Family Participation in Patient Care: Yes (Comment) Care giver support system in place?: Yes (comment) Current home services: Home  PT, DME Criminal Activity/Legal Involvement Pertinent to Current Situation/Hospitalization: No - Comment as needed  Activities of Daily Living Home Assistive Devices/Equipment: CBG Meter, Blood pressure cuff, Wheelchair, Environmental consultant (specify type), Bedside commode/3-in-1, Shower chair with back ADL Screening (condition at time of admission) Patient's cognitive ability adequate to safely complete daily activities?: No Is the patient deaf or have difficulty hearing?: No Does the patient have difficulty seeing, even when wearing glasses/contacts?: Yes(rt side vision loss) Does the patient have difficulty concentrating, remembering, or making decisions?: No Patient able to express need for assistance with ADLs?: Yes Does the patient have difficulty dressing or bathing?: Yes Independently performs ADLs?: No Communication: Dependent(can stand with assistance) Is this a change from baseline?: Pre-admission baseline Dressing (OT): Dependent Is this a change from baseline?: Pre-admission baseline Grooming: Dependent Is this a change from baseline?: Pre-admission baseline Feeding: Independent Bathing: Dependent Is this a change from baseline?: Pre-admission baseline Toileting: Dependent Is this a change from baseline?: Pre-admission baseline In/Out Bed: Dependent Is this a change from baseline?: Pre-admission baseline Walks in Home: Dependent Is this a change from baseline?: Pre-admission baseline Does the patient have difficulty walking or climbing stairs?: Yes Weakness of Legs: Right Weakness of Arms/Hands: Right  Permission Sought/Granted Permission sought to share information with : Facility Industrial/product designer granted to share information with : Yes, Verbal Permission Granted  Share Information with NAME: Douglas [name]     Permission granted to share info w Relationship: Daughter  Permission granted to share info w Contact Information: [phone]  Emotional  Assessment Appearance:: Other (Comment Required(Unable to assess) Attitude/Demeanor/Rapport: Unable to Assess Affect (typically observed): Unable to Assess Orientation: : (Unable to assess) Alcohol / Substance Use: Not Applicable Psych Involvement: No (comment)  Admission diagnosis:  Hypocalcemia [E83.51] Delirium [R41.0] AMS (altered mental status) [R41.82] Patient Active Problem List   Diagnosis Date Noted  . AMS (altered mental status) 06/05/2019  . Ischemic stroke ([place]) 03/05/2019  . UTI (urinary tract infection) 03/05/2019  . Acute blood loss anemia   . Labile blood pressure   . 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 Labile blood glucose   . Global aphasia   . Cerebral edema ([place]) 04/04/2018  . Renal insufficiency 04/04/2018  . 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 Acute lower UTI   . Type 2 diabetes mellitus with peripheral neuropathy (HCC)   . Acute embolic stroke ([place])   . Diabetes 1.5, managed as type 2 ([place])   . Essential hypertension   . Dyslipidemia   . History of CVA with residual deficit   . Dysphagia, post-stroke    PCP:  Patient, No Pcp Per Pharmacy:   [id number] - [place], [place]. AT [address] [place] [address] Phone: [phone] Fax: [phone]     Social Determinants of Health (SDOH) Interventions    Readmission Risk Interventions No flowsheet data found.

## 2019-06-06 NOTE — Progress Notes (Signed)
Responded to spiritual Care consult to support patient. Provided prayer, presence and emotional support. Spoke with patient nurse.  Per nurse patient had a good night.  Chaplain available as needed.  Venida Jarvis, [place], [facility], Pager [phone]

## 2019-06-06 NOTE — Evaluation (Addendum)
Clinical/Bedside Swallow Evaluation Patient Details  Name: Douglas [name] MRN: [id number] Date of Birth: 1951-04-09  Today's Date: 06/06/2019 Time: SLP Start Time (ACUTE ONLY): 1400 SLP Stop Time (ACUTE ONLY): 1420 SLP Time Calculation (min) (ACUTE ONLY): 20 min  Past Medical History:  Past Medical History:  Diagnosis Date  . Diabetes mellitus without complication ([place])   . Hypertension   . Stroke [facility])    Past Surgical History:  Past Surgical History:  Procedure Laterality Date  . IR PATIENT EVAL TECH 0-60 MINS  03/30/2018  . LOOP RECORDER INSERTION N/A 03/08/2019   Procedure: LOOP RECORDER INSERTION;  Surgeon: Constance Haw, MD;  Location: [facility] CV LAB;  Service: Cardiovascular;  Laterality: N/A;  . TEE WITHOUT CARDIOVERSION N/A 04/02/2018   Procedure: TRANSESOPHAGEAL ECHOCARDIOGRAM (TEE);  Surgeon: Skeet Latch, MD;  Location: [facility] ENDOSCOPY;  Service: Cardiovascular;  Laterality: N/A;   HPI:  69 yo admitted with AMS, head CT(-). PMhx: HTN, DM, CVA with Rt hemiplegia. Patient known to [place] service after previous CVAs. Pt last seen 03/2019 for MBS, recommended dysphagia 3/thin liquids diet with no straws at that time.   Assessment / Plan / Recommendation Clinical Impression   Patient seen for bedside swallowing assessment. Patient presents with mild suspected oropharyngeal dysphagia, recommend dysphagia 3 solids, thin liquids with no straws.   Patient known to our service, most recent known MBS 03/2019 notable for aspiration with straw sips of thin liquid, though it was sensed and patient able to clear it w/ cough.   Patient with verbal perseveration of "yes" throughout session, inconsistently able to follow some commands. Pt with slightly right facial asymmetry. Pt seen with thin liquids via cup and straw sips: pt with minimal anterior spillage on R, he demonstrated awareness. Pt with immediate coughing following all straw sips and large cup sip. No overt s/sx  aspiration with small sips of thin liquids.  When presented with dysphagia 3 meal tray, patient noted to have a impulsive rate of intake, rapidly consuming. He was not responsive to verbal cues to reduce rate of intake. Pt with grossly adequate oral acceptance, mildly prolonged but timely mastication. No overt s/sx aspiration seen with dysphagia 3 solids.   Recommend dysphagia 3 solids and thin liquids with  - No straws - Full supervision to cue patient to take small bites/sips, take single small sips of liquids, slow his rate of intake, sit upright. - Oral care BID -Meds whole or crushed in puree  ST to follow acutely.  Note: ST began BSS at 11:15-11:25am, ST completed oral care and saw patient with liquids. Patient becoming very agitated, squeezing styrofoam cup until it broke. ST left and returned later in day to resume session.   SLP Visit Diagnosis: Dysphagia, oropharyngeal phase (R13.12)    Aspiration Risk  Mild aspiration risk    Diet Recommendation Dysphagia 3 (Mech soft);Thin liquid   Liquid Administration via: No straw Medication Administration: Whole meds with puree Supervision: Full supervision/cueing for compensatory strategies Compensations: Minimize environmental distractions;Slow rate;Small sips/bites;Other (Comment)(no straw) Postural Changes: Seated upright at 90 degrees;Remain upright for at least 30 minutes after po intake    Other  Recommendations Oral Care Recommendations: Oral care BID;Staff/trained caregiver to provide oral care   Follow up Recommendations Other (comment)(TBD)      Frequency and Duration min 2x/week  2 weeks       Prognosis Prognosis for Safe Diet Advancement: Fair Barriers to Reach Goals: Cognitive deficits;Language deficits;Behavior      Swallow Study  General HPI: 69 yo admitted with AMS, head CT(-). PMhx: HTN, DM, CVA with Rt hemiplegia. Patient known to ST service after previous CVAs. Pt last seen 03/2019 for MBS, recommended  dysphagia 3/thin liquids diet with no straws at that time. Type of Study: Bedside Swallow Evaluation Previous Swallow Assessment: (MBS 03/2019) Diet Prior to this Study: Dysphagia 3 (soft);Thin liquids Temperature Spikes Noted: No Respiratory Status: Room air History of Recent Intubation: No Behavior/Cognition: Alert;Impulsive;Distractible Oral Cavity Assessment: Within Functional Limits Oral Care Completed by SLP: Yes Oral Cavity - Dentition: Missing dentition;Adequate natural dentition Self-Feeding Abilities: Able to feed self;Needs assist Patient Positioning: Upright in bed;Upright in chair Baseline Vocal Quality: Normal Volitional Cough: Cognitively unable to elicit Volitional Swallow: (present w/ secretions)    Oral/Motor/Sensory Function Overall Oral Motor/Sensory Function: Mild impairment Facial ROM: Reduced right Facial Symmetry: Abnormal symmetry right Facial Sensation: Other (Comment)(difficult to assess 2/2 cognitive status) Lingual Symmetry: Other (Comment)(Pt did not protrude tongue on command)   Ice Chips Ice chips: Not tested   Thin Liquid Thin Liquid: Impaired Presentation: Cup;Spoon;Straw Oral Phase Impairments: Reduced labial seal;Poor awareness of bolus Oral Phase Functional Implications: Prolonged oral transit;Oral holding;Oral residue Pharyngeal  Phase Impairments: Suspected delayed Swallow;Cough - Immediate    Nectar Thick Nectar Thick Liquid: Not tested   Honey Thick Honey Thick Liquid: Not tested   Puree Puree: Within functional limits   Solid     Solid: Impaired Presentation: Self Fed Oral Phase Impairments: Reduced lingual movement/coordination;Impaired mastication Oral Phase Functional Implications: Prolonged oral transit;Impaired mastication;Oral residue      [employer] 06/06/2019,2:23 PM Shella Spearing, M.Ed., CCC-SLP Speech Therapy Acute Rehabilitation [phone]: Acute Rehab office [phone] - pager

## 2019-06-06 NOTE — NC FL2 (Signed)
[place] LEVEL OF CARE SCREENING TOOL     IDENTIFICATION  Patient Name: Douglas [name] Birthdate: 07/14/1950 Sex: male Admission Date (Current Location): 06/05/2019  [facility] and [place] Number:  Herbalist and Address:  [facility], [place] [address], [place], [place] [address]      Provider Number: [id number]  Attending Physician Name and Address:  Dickie La, MD  Relative Name and Phone Number:  [place], [place] [phone]    Current Level of Care: Hospital Recommended Level of Care: [place] Prior Approval Number:    Date Approved/Denied:   PASRR Number: [id number]  Discharge Plan: SNF    Current Diagnoses: Patient Active Problem List   Diagnosis Date Noted  . AMS (altered mental status) 06/05/2019  . Ischemic stroke ([place]) 03/05/2019  . UTI (urinary tract infection) 03/05/2019  . Acute blood loss anemia   . Labile blood pressure   . 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 Labile blood glucose   . Global aphasia   . Cerebral edema ([place]) 04/04/2018  . Renal insufficiency 04/04/2018  . 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 Acute lower UTI   . Type 2 diabetes mellitus with peripheral neuropathy (HCC)   . Acute embolic stroke ([place])   . Diabetes 1.5, managed as type 2 ([place])   . Essential hypertension   . Dyslipidemia   . History of CVA with residual deficit   . Dysphagia, post-stroke     Orientation RESPIRATION BLADDER Height & Weight     (Unable to assess)  Normal Incontinent Weight: 195 lb (88.5 kg) Height:  6\' 1"  (185.4 cm)  BEHAVIORAL SYMPTOMS/MOOD NEUROLOGICAL BOWEL NUTRITION STATUS      Incontinent Diet(See discharge summary)  AMBULATORY STATUS COMMUNICATION OF NEEDS Skin   Extensive Assist Verbally Normal                       Personal Care Assistance Level of Assistance  Bathing, Feeding, Dressing  Bathing Assistance: Maximum assistance Feeding assistance: Limited assistance Dressing Assistance: Maximum assistance     Functional Limitations Info  Sight, Hearing, Speech Sight Info: Adequate Hearing Info: Adequate Speech Info: Adequate    SPECIAL CARE FACTORS FREQUENCY  PT (By licensed PT), OT (By licensed OT), Speech therapy     PT Frequency: 5x week OT Frequency: 5x week     Speech Therapy Frequency: 5x week      Contractures Contractures Info: Not present    Additional Factors Info  Code Status, Allergies, Insulin Sliding Scale Code Status Info: Full Allergies Info: Ciprofloxacin, Keppra, Avocados   Insulin Sliding Scale Info: Insulin Aspart (Novolog) 0-9 3x daily w/ meals       Current Medications (06/06/2019):  This is the current hospital active medication list Current Facility-Administered Medications  Medication Dose Route Frequency Provider Last Rate Last Admin  . amLODipine (NORVASC) tablet 10 mg  10 mg Oral Daily Lockamy, Timothy, DO   10 mg at 06/06/19 0917  . aspirin chewable tablet 81 mg  81 mg Oral Daily Lockamy, Timothy, DO   81 mg at 06/06/19 0917  . atorvastatin (LIPITOR) tablet 40 mg  40 mg Oral q1800 Lockamy, Timothy, DO   40 mg at 06/05/19 2042  . calcium citrate-vitamin D 500-500 MG-UNIT per chewable tablet 1 tablet  1 tablet Oral Daily Lockamy, Timothy, DO      .  clopidogrel (PLAVIX) tablet 75 mg  75 mg Oral Daily Lockamy, Timothy, DO   75 mg at 06/06/19 0917  . enoxaparin (LOVENOX) injection 40 mg  40 mg Subcutaneous Q24H Dana Allan, MD   40 mg at 06/05/19 2045  . hydrALAZINE (APRESOLINE) tablet 50 mg  50 mg Oral TID Arlyce Harman, DO   50 mg at 06/06/19 1146  . insulin aspart (novoLOG) injection 0-9 Units  0-9 Units Subcutaneous TID WC Dana Allan, MD   5 Units at 06/06/19 1150  . lamoTRIgine (LAMICTAL) tablet 100 mg  100 mg Oral BID Lockamy, Timothy, DO   100 mg at 06/06/19 0917  . lisinopril (ZESTRIL) tablet 10 mg  10 mg Oral Daily  Lockamy, Timothy, DO   10 mg at 06/06/19 0917  . liver oil-zinc oxide (DESITIN) 40 % ointment   Topical BID PRN Carney Living, MD      . metoprolol succinate (TOPROL-XL) 24 hr tablet 25 mg  25 mg Oral Daily Lockamy, Timothy, DO   25 mg at 06/06/19 0917  . sodium chloride 0.9 % bolus 500 mL  500 mL Intravenous Once Dana Allan, MD 500 mL/hr at 06/05/19 1802 Restarted at 06/05/19 1802  . tamsulosin (FLOMAX) capsule 0.4 mg  0.4 mg Oral QPC supper Lockamy, Marcial Pacas, DO   0.4 mg at 06/05/19 2041     Discharge Medications: Please see discharge summary for a list of discharge medications.  Relevant Imaging Results:  Relevant Lab Results:   Additional Information SS# [id number]  Terrilee Croak, [place] Work

## 2019-06-07 LAB — GLUCOSE, CAPILLARY
Glucose-Capillary: 130 mg/dL — ABNORMAL HIGH (ref 70–99)
Glucose-Capillary: 263 mg/dL — ABNORMAL HIGH (ref 70–99)
Glucose-Capillary: 135 mg/dL — ABNORMAL HIGH (ref 70–99)
Glucose-Capillary: 202 mg/dL — ABNORMAL HIGH (ref 70–99)

## 2019-06-07 LAB — CALCIUM, IONIZED: Calcium, Ionized, Serum: 5.2 mg/dL (ref 4.5–5.6)

## 2019-06-07 MED ORDER — BACLOFEN 10 MG PO TABS
10.0000 mg | ORAL_TABLET | Freq: Two times a day (BID) | ORAL | Status: DC
  Administered 2019-06-07 – 2019-06-09 (×5): 10 mg via ORAL
  Filled 2019-06-07 (×5): qty 1

## 2019-06-07 MED ORDER — SENNOSIDES-DOCUSATE SODIUM 8.6-50 MG PO TABS
1.0000 | ORAL_TABLET | Freq: Two times a day (BID) | ORAL | Status: DC
  Administered 2019-06-07 – 2019-06-09 (×4): 1 via ORAL
  Filled 2019-06-07 (×4): qty 1

## 2019-06-07 MED ORDER — POLYETHYLENE GLYCOL 3350 17 G PO PACK
17.0000 g | PACK | Freq: Two times a day (BID) | ORAL | Status: DC
  Administered 2019-06-07 – 2019-06-09 (×4): 17 g via ORAL
  Filled 2019-06-07 (×4): qty 1

## 2019-06-07 NOTE — Progress Notes (Signed)
Family Medicine Teaching Service Daily Progress Note Intern Pager: [phone]  Patient name: Jahiem Franzoni Medical record number: [id number] Date of birth: 03-13-1951 Age: 69 y.o. Gender: male  Primary Care Provider: Patient, No Pcp Per Consultants: None Code Status: Full  Pt Overview and Major Events to Date:  01/03-admitted  Assessment and Plan: Manas Hickling is a 69 y.o. male presenting with AMS. PMH is significant for HTN, DM Type II, CVA with Rt hemiplegia  AMS likely secondary to hypocalcemia Vital signs stable.  Patient at baseline -Ionized calcium pending -Monitor vital signs -Continuous cardiac monitoring  H/o Seizure No seizure activity overnight -Continue Lamictal -Seizure precautions  HTN Normotensive overnight -Continue Norvasc 10 mg daily -Continue Zestril 40 mg twice daily -Continue metoprolol succinate 25 mg daily -Continue hydralazine 50 mg 3 times daily -Monitor blood pressure  DM Type II-HbA1c 6.7 CBGs overnight 148-146.  Required 7 units insulin coverage yesterday. -Continue to hold glipizide -Continue sensitive sliding scale insulin coverage -CBG monitoring  H/O CVA with Rt Hemiplegia Pt at baseline per daughter. Home medication Plavix 75mg  daily and EC ASA  325 mg daily.  CT head negative for acute bleed. -Continue home medication -PT/OT  BPH Stable.  Home medication Flomax 0.4 mg daily -Continue home medication  FEN/GI:  -Dysphagia III diet Prophylaxis: -Lovenox  Disposition: SNF  Subjective:  No acute events overnight.  Patient at baseline.  Patient resting comfortably in bed.   Objective: Temp:  [97.4 F (36.3 C)-98.4 F (36.9 C)] 98.2 F (36.8 C) (02/05 0435) Pulse Rate:  [78-104] 78 (02/05 0435) Resp:  [17-20] 18 (02/05 0435) BP: (123-168)/(72-95) 137/72 (02/05 0435) SpO2:  [99 %-100 %] 100 % (02/05 0435)   Physical Exam: General: 69 year old male in no acute distress Cardiovascular: Regular rate and  rhythm, no murmurs appreciated Respiratory: Chest clear to auscultation bilaterally, no wheezes or crackles noted Abdomen: Soft, nontender, nondistended, bowel sounds present Extremities: Right side hemiplegia, no lower extremity edema.  Laboratory: Recent Labs  Lab 06/05/19 1352 06/05/19 1352 06/05/19 1509 06/05/19 1701 06/06/19 0441  WBC 5.1  --   --  5.8 6.6  HGB 14.3   < > 15.6 14.7 15.5  HCT 44.4   < > 46.0 45.3 47.8  PLT 195  --   --  200 182   < > = values in this interval not displayed.   Recent Labs  Lab 06/05/19 1352 06/05/19 1352 06/05/19 1509 06/05/19 1822 06/06/19 0441  NA 141  --  137  --  142  K 3.5  --  5.0  --  4.6  CL 110  --  112*  --  102  CO2 24  --   --   --  23  BUN 12  --  18  --  13  CREATININE 0.77   < > 0.70 0.90 0.92  CALCIUM 9.2  --   --  9.3 9.6  PROT 6.7  --   --   --  6.7  BILITOT 1.1  --   --   --  1.3*  ALKPHOS 83  --   --   --  97  ALT 21  --   --   --  21  AST 13*  --   --   --  24  GLUCOSE 161*  --  138*  --  140*   < > = values in this interval not displayed.      Imaging/Diagnostic Tests: No results found.  Volanda Napoleon,  Kenney Houseman, MD 06/07/2019, 5:55 AM PGY-1, [facility] Family Medicine FPTS Intern pager: [phone], text pages welcome

## 2019-06-07 NOTE — Discharge Summary (Addendum)
Family Medicine Teaching [facility] Discharge Summary  Patient name: Douglas [name] Medical record number: [id number] Date of birth: [birth year] Age: 69 y.o. Gender: male Date of Admission: 06/05/2019  Date of Discharge: 06/09/2019 Admitting Physician: Dana Allan, MD  Primary Care Provider: Patient, No Pcp Per Consultants: SLP, PT, OT  Indication for Hospitalization: Altered mental status  Discharge Diagnoses/Problem List:  Hypertension, diabetes, CVA with right hemiplegia, limited verbal communication  Disposition: To skilled nursing facility  Discharge Condition: Stable  Discharge Exam: 06/09/2019 General: 69 year old male in no acute distress Cardiovascular: Regular rate and rhythm, no murmurs or gallops appreciated Respiratory: Chest clear to auscultation bilaterally.  No wheezes or crackles noted, no increased work of breathing Abdomen: Soft, nontender, nondistended, bowel sounds present, no organomegaly Extremities: Right-sided hemiplegia, no lower extremity edema noted.  Brief Hospital Course:  Patient with chronic CVA deficits had been living at home with daughter, brought to ED when she thought that he was having an episode of acute agitation.  Patient was clinically stable during admission with rapid return to mental baseline per daughter.  Initial question of hypocalcemia on i-STAT negated by normal serum ionized calcium.  CT head negative for acute CVA, mass or intracranial hemorrhage.  Daughter has been very supportive and initially was agreeable to SNF but due to some concerns about where he would be placed decided to take him home and get a home aid. Speech is recommended dysphagia 3 diet. Patient also mild AKI.  Creatinine 1.42 that was corrected with 500 mill bolus.  Zestril was discontinued due to mild increasing creatinine.  Most likely cause of recent AKI was decreased p.o. intake.  On day of discharge creatinine 1.10.  Issues for Follow Up:  1. Dysphagia 3  diet per speech, oral care per staff twice daily 2. Encourage fluids to avoid dehydration. 3. Continuous supervision while feeding to ensure adequate hydration. 4. Follow-up blood glucose.  Consider discontinue sulfonylurea and initiating GLP-1  5. Monitor creatinine, consider discontinue Zestril if continues to elevate   Significant Procedures: None  Significant Labs and Imaging:  Recent Labs  Lab 06/05/19 1701 06/06/19 0441 06/08/19 0517  WBC 5.8 6.6 6.5  HGB 14.7 15.5 13.0  HCT 45.3 47.8 40.1  PLT 200 182 189   Recent Labs  Lab 06/05/19 1352 06/05/19 1352 06/05/19 1509 06/05/19 1509 06/05/19 1822 06/06/19 0441 06/06/19 0441 06/08/19 0517 06/09/19 0508  NA 141  --  137  --   --  142  --  139 137  K 3.5   < > 5.0   < >  --  4.6   < > 4.2 4.3  CL 110  --  112*  --   --  102  --  105 105  CO2 24  --   --   --   --  23  --  25 25  GLUCOSE 161*  --  138*  --   --  140*  --  194* 188*  BUN 12  --  18  --   --  13  --  21 17  CREATININE 0.77   < > 0.70  --  0.90 0.92  --  1.42* 1.10  CALCIUM 9.2  --   --   --  9.3 9.6  --  9.4 9.2  MG  --   --   --   --  2.1  --   --   --   --   PHOS  --   --   --   --  2.9  --   --   --   --   ALKPHOS 83  --   --   --   --  97  --   --   --   AST 13*  --   --   --   --  24  --   --   --   ALT 21  --   --   --   --  21  --   --   --   ALBUMIN 3.9  --   --   --   --  4.0  --   --   --    < > = values in this interval not displayed.    CT HEAD WO CONTRAST  Result Date: 06/05/2019 CLINICAL DATA:  Increased altered mental status starting this morning. Prior stroke. EXAM: CT HEAD WITHOUT CONTRAST TECHNIQUE: Contiguous axial images were obtained from the base of the skull through the vertex without intravenous contrast. COMPARISON:  Multiple exams, including CT and MRI examinations from 03/05/2019 and 03/07/2019 FINDINGS: Brain: Large previous left PCA and MCA strokes with associated encephalomalacia. Distribution appears grossly similar to the  04/01/2019 exam. Periventricular white matter and corona radiata hypodensities favor chronic ischemic microvascular white matter disease. Remote small lacunar infarct of the left pons. Expected Wallerian degeneration. No intracranial hemorrhage, mass lesion, or acute CVA. Vascular: There is atherosclerotic calcification of the cavernous carotid arteries bilaterally. Skull: Unremarkable Sinuses/Orbits: Unremarkable Other: No supplemental non-categorized findings. IMPRESSION: 1. No acute intracranial findings. 2. Large previous left PCA and MCA strokes with associated encephalomalacia. 3. Periventricular white matter and corona radiata hypodensities favor chronic ischemic microvascular white matter disease. Remote small lacunar infarct of the left pons. Electronically Signed   By: Gaylyn Rong M.D.   On: 06/05/2019 16:06    Results/Tests Pending at Time of Discharge:   Discharge Medications:  Allergies as of 06/09/2019      Reactions   Ciprofloxacin Anaphylaxis   Keppra [levetiracetam] Other (See Comments)   Makes the patient very agitated and "not like himself"   Other Nausea And Vomiting, Other (See Comments)   Avacados      Medication List    TAKE these medications   Accu-Chek Aviva Plus test strip Generic drug: glucose blood daily.   amLODipine 10 MG tablet Commonly known as: NORVASC Take 1 tablet (10 mg total) by mouth daily.   ascorbic acid 500 MG tablet Commonly known as: VITAMIN C Take 500 mg by mouth daily as needed (if cold-like symptoms are present).   aspirin 325 MG EC tablet Take 1 tablet (325 mg total) by mouth daily.   atorvastatin 40 MG tablet Commonly known as: LIPITOR Take 1 tablet (40 mg total) by mouth daily at 6 PM. What changed:   medication strength  how much to take  when to take this   baclofen 10 MG tablet Commonly known as: LIORESAL Take 20 mg by mouth 2 (two) times daily.   calcium citrate 950 (200 Ca) MG tablet Commonly known as:  CALCITRATE - dosed in mg elemental calcium Take 0.5 tablets (475 mg total) by mouth daily. Start taking on: June 10, 2019   cloNIDine 0.2 MG tablet Commonly known as: CATAPRES Take 0.2 mg by mouth every evening.   clopidogrel 75 MG tablet Commonly known as: PLAVIX Take 1 tablet (75 mg total) by mouth daily.   glipiZIDE 10 MG tablet Commonly known as: GLUCOTROL Take 1 tablet (10 mg total) by mouth  2 (two) times daily.   hydrALAZINE 50 MG tablet Commonly known as: APRESOLINE Take 50 mg by mouth 3 (three) times daily.   hydrALAZINE 25 MG tablet Commonly known as: APRESOLINE Take 25 mg by mouth 3 (three) times daily.   insulin detemir 100 UNIT/ML injection Commonly known as: LEVEMIR Inject 0.16 mLs (16 Units total) into the skin daily. What changed:   when to take this  reasons to take this   lamoTRIgine 100 MG tablet Commonly known as: LAMICTAL Take 100 mg by mouth 2 (two) times daily.   lisinopril 20 MG tablet Commonly known as: ZESTRIL Take 20 mg by mouth 2 (two) times daily.   liver oil-zinc oxide 40 % ointment Commonly known as: DESITIN Apply topically 2 (two) times daily as needed for irritation (for protection).   metoprolol succinate 25 MG 24 hr tablet Commonly known as: TOPROL-XL Take 25 mg by mouth daily.   NovoLOG FlexPen 100 UNIT/ML FlexPen Generic drug: insulin aspart Inject 3 Units into the skin See admin instructions. Inject 3-4 units into the skin up to two times a day before meals as needed for a BGL of 200 or greater- per sliding scale   pantoprazole 40 MG tablet Commonly known as: PROTONIX Take 1 tablet (40 mg total) by mouth daily.   tamsulosin 0.4 MG Caps capsule Commonly known as: FLOMAX Take 0.4 mg by mouth daily after supper.   Vitamin D3 25 MCG tablet Commonly known as: Vitamin D Take 0.5 tablets (500 Units total) by mouth daily. Start taking on: June 10, 2019            Durable Medical Equipment  (From admission,  onward)         Start     Ordered   06/07/19 1259  For home use only DME Other see comment  Once    Comments: Harrel Lemon Lift  Question:  Length of Need  Answer:  Lifetime   06/07/19 1258          Discharge Instructions: Please refer to Patient Instructions section of EMR for full details.  Patient was counseled important signs and symptoms that should prompt return to medical care, changes in medications, dietary instructions, activity restrictions, and follow up appointments.   Follow-Up Appointments:   Carollee Leitz, MD 06/09/2019, 12:30 PM PGY-1, [place] [facility]  Resident Attestation   I saw and evaluated the patient, performing the key elements of the service. I personally performed or re-performed the history, physical exam, and medical decision making activities of this service and have verified that the service and findings are accurately documented in the resident's note. I developed the management plan that is described in the resident's note, and I agree with the content, with my edits above in red.   Harolyn Rutherford, DO [facility], PGY-3

## 2019-06-07 NOTE — TOC Progression Note (Signed)
Transition of Care [facility]) - Progression Note    Patient Details  Name: Douglas [name] MRN: [id number] Date of Birth: Apr 16, 1951  [facility]) CM/SW Contact  Baldemar Lenis, [place] Phone Number: 06/07/2019, 4:39 PM  Clinical Narrative:   CSW spoke with patient's daughter, Douglas [name], earlier today to provide bed offers. [name] asked CSW to look into some other facility options. CSW reached out to [facility], per daughter request, but they are both full and unable to offer a bed for the patient. CSW provided Douglas [name] with update, and she is researching options available. No SNF choice at this time. CSW to follow.    Expected Discharge Plan: Skilled Nursing Facility Barriers to Discharge: Continued Medical Work up  Expected Discharge Plan and Services Expected Discharge Plan: Skilled Nursing Facility     Post Acute Care Choice: Skilled Nursing Facility Living arrangements for the past 2 months: Single Family Home                                       Social Determinants of Health (SDOH) Interventions    Readmission Risk Interventions No flowsheet data found.

## 2019-06-07 NOTE — Progress Notes (Signed)
  Speech Language Pathology Treatment:   Dysphagia Patient Details Name: Douglas [name] MRN: [id number] DOB: Sep 03, 1950 Today's Date: 06/07/2019 Time: 1157-2620 SLP Time Calculation (min) (ACUTE ONLY): 17 min  Assessment / Plan / Recommendation Clinical Impression  Observed pt during medication pass with nursing. Pt reluctant to take medication from nurse unless he was able to hold the spoon himself. Observed to have anterior spillage on right side, one incident of throat clearing after thin liquids. No changes noted in respiratory status of wet vocal quality. Pt communicated with gesture his desire to get back to bed. When confirmed his wish, he verbalized yes. His communication appears to be consistent with prior level of ability. Continue current diet with full supervision to cue pt for small sips and bites. No straws and medication whole in purees. ST to follow during acute stay.    HPI HPI: 69 yo admitted with AMS, head CT(-). PMhx: HTN, DM, CVA with Rt hemiplegia. Patient known to ST service after previous CVAs. Pt last seen 03/2019 for MBS, recommended dysphagia 3/thin liquids diet with no straws at that time.      SLP Plan  Continue with current plan of care       Recommendations  Diet recommendations: Dysphagia 3 (mechanical soft);Thin liquid Liquids provided via: Cup;No straw Medication Administration: Whole meds with puree Supervision: Full supervision/cueing for compensatory strategies Compensations: Minimize environmental distractions;Slow rate;Small sips/bites;Other (Comment) Postural Changes and/or Swallow Maneuvers: Seated upright 90 degrees                Oral Care Recommendations: Oral care BID;Staff/trained caregiver to provide oral care SLP Visit Diagnosis: Dysphagia, oropharyngeal phase (R13.12) Plan: Continue with current plan of care       GO                Luis Abed., MA CCC-SLP 06/07/2019, 11:38 AM

## 2019-06-07 NOTE — Progress Notes (Signed)
Physical Therapy Treatment Patient Details Name: Douglas [name] MRN: [id number] DOB: [birth year] Today's Date: 06/07/2019    History of Present Illness 69 yo admitted with AMS with yes/no responses and pointing, head CT(-). PMhx: HTN, DM, CVA with Rt hemiplegia    PT Comments    Pt awake, alert and continues to provide yes responses to all questions. Pt with improved transition to EOB and sitting balance today. Pt with bowel smear on standing from bed and transitioned to [facility]. Pt without void other than continual smears on washcloth at [facility] and more resistant to standing from [facility] with potential sensation for continued void. Pt transitioned to chair with RN present and aware for transfers and possible continued need to void but unable to remain unassisted on BSC. Will continue to follow and progress mobility as pt able.    Follow Up Recommendations  SNF;Supervision/Assistance - 24 hour     Equipment Recommendations  Other (comment)(hoyer lift)    Recommendations for Other Services       Precautions / Restrictions Precautions Precautions: Fall Precaution Comments: high tone in RUE and RLE    Mobility  Bed Mobility Overal bed mobility: Needs Assistance Bed Mobility: Supine to Sit     Supine to sit: Max assist     General bed mobility comments: pt with assist to move RLE off of bed and with use of LUE able to rise to sitting EOB with max assist. Pt with good sitting balance EOB today with guarding for safety  Transfers Overall transfer level: Needs assistance   Transfers: Sit to/from Stand;Stand Pivot Transfers Sit to Stand: Max assist;+2 physical assistance Stand pivot transfers: Max assist;+2 physical assistance       General transfer comment: pt stood with use of stedy x 5 trials with variation from mod +2 to max +2. Pt with apparent awareness of cues and will assist to stand from bed but on BSC pt without significant void and required max +2 to rise (potentially still  needing to void) with some resistance to rise. Transfer bed to [facility] and to recliner with use of Stedy  Ambulation/Gait                 Stairs             Wheelchair Mobility    Modified Rankin (Stroke Patients Only)       Balance Overall balance assessment: Needs assistance Sitting-balance support: Single extremity supported;Feet supported Sitting balance-Leahy Scale: Fair Sitting balance - Comments: pt able to sit EOB and at [facility] with minguard   Standing balance support: Single extremity supported Standing balance-Leahy Scale: Poor Standing balance comment: use of stedy and physical assist to maintain standing                            Cognition Arousal/Alertness: Awake/alert Behavior During Therapy: Flat affect Overall Cognitive Status: No family/caregiver present to determine baseline cognitive functioning                                 General Comments: pt responding yes to all questions even inappropriately. pt continues pointing at items today particularly BSC after use      Exercises      General Comments        Pertinent Vitals/Pain Pain Assessment: Faces Pain Score: 0-No pain    Home Living  Prior Function            PT Goals (current goals can now be found in the care plan section) Progress towards PT goals: Progressing toward goals    Frequency    Min 3X/week      PT Plan Current plan remains appropriate    Co-evaluation              AM-PAC PT "6 Clicks" Mobility   Outcome Measure  Help needed turning from your back to your side while in a flat bed without using bedrails?: Total Help needed moving from lying on your back to sitting on the side of a flat bed without using bedrails?: Total Help needed moving to and from a bed to a chair (including a wheelchair)?: Total Help needed standing up from a chair using your arms (e.g., wheelchair or bedside chair)?:  Total Help needed to walk in hospital room?: Total Help needed climbing 3-5 steps with a railing? : Total 6 Click Score: 6    End of Session Equipment Utilized During Treatment: Gait belt Activity Tolerance: Patient tolerated treatment well Patient left: in chair;with call bell/phone within reach;with chair alarm set Nurse Communication: Mobility status;Need for lift equipment;Precautions PT Visit Diagnosis: Other abnormalities of gait and mobility (R26.89);Unsteadiness on feet (R26.81);Muscle weakness (generalized) (M62.81);Other symptoms and signs involving the nervous system (R29.898)     Time: 1610-9604 PT Time Calculation (min) (ACUTE ONLY): 26 min  Charges:  $Therapeutic Activity: 23-37 mins                     Axil Copeman P, PT Acute Rehabilitation Services Pager: [phone] Office: [place] 06/07/2019, 1:33 PM

## 2019-06-08 DIAGNOSIS — L8990 Pressure ulcer of unspecified site, unspecified stage: Secondary | ICD-10-CM | POA: Insufficient documentation

## 2019-06-08 LAB — BASIC METABOLIC PANEL
Anion gap: 9 (ref 5–15)
BUN: 21 mg/dL (ref 8–23)
CO2: 25 mmol/L (ref 22–32)
Calcium: 9.4 mg/dL (ref 8.9–10.3)
Chloride: 105 mmol/L (ref 98–111)
Creatinine, Ser: 1.42 mg/dL — ABNORMAL HIGH (ref 0.61–1.24)
GFR calc Af Amer: 58 mL/min — ABNORMAL LOW (ref >60)
GFR calc non Af Amer: 50 mL/min — ABNORMAL LOW (ref >60)
Glucose, Bld: 194 mg/dL — ABNORMAL HIGH (ref 70–99)
Potassium: 4.2 mmol/L (ref 3.5–5.1)
Sodium: 139 mmol/L (ref 135–145)

## 2019-06-08 LAB — CBC
HCT: 40.1 % (ref 39.0–52.0)
Hemoglobin: 13 g/dL (ref 13.0–17.0)
MCH: 30.2 pg (ref 26.0–34.0)
MCHC: 32.4 g/dL (ref 30.0–36.0)
MCV: 93.3 fL (ref 80.0–100.0)
Platelets: 189 10*3/uL (ref 150–400)
RBC: 4.3 MIL/uL (ref 4.22–5.81)
RDW: 13 % (ref 11.5–15.5)
WBC: 6.5 10*3/uL (ref 4.0–10.5)
nRBC: 0 % (ref 0.0–0.2)

## 2019-06-08 LAB — GLUCOSE, CAPILLARY
Glucose-Capillary: 176 mg/dL — ABNORMAL HIGH (ref 70–99)
Glucose-Capillary: 286 mg/dL — ABNORMAL HIGH (ref 70–99)
Glucose-Capillary: 204 mg/dL — ABNORMAL HIGH (ref 70–99)
Glucose-Capillary: 175 mg/dL — ABNORMAL HIGH (ref 70–99)

## 2019-06-08 MED ORDER — SODIUM CHLORIDE 0.9 % IV BOLUS
500.0000 mL | Freq: Once | INTRAVENOUS | Status: AC
  Administered 2019-06-08: 12:00:00 500 mL via INTRAVENOUS

## 2019-06-08 NOTE — Plan of Care (Signed)

## 2019-06-08 NOTE — TOC Progression Note (Signed)
Transition of Care [facility]) - Progression Note    Patient Details  Name: Derrall Hicks MRN: [id number] Date of Birth: 1950/09/21  [facility]) CM/SW Contact  Baldemar Lenis, [place] Phone Number: 06/08/2019, 2:11 PM  Clinical Narrative:  CSW spoke with daughter, [name], to ask about SNF choice. [name] is concerned with the bed offers, doesn't want to place the patient in SNF right now, she would rather take him home. [name] is arranging aide support to help her, won't have an aide available until tomorrow afternoon. [name] would like the lift sent to the house to assist with the patient's care, CSW provided order to [employer]. [name] says that transport can be arranged tomorrow for the patient to arrive home at 6:30 tomorrow evening.   CSW updated MD about discharge plan. CSW to follow for patient to discharge home tomorrow, if stable.     Expected Discharge Plan: Skilled Nursing Facility Barriers to Discharge: Continued Medical Work up  Expected Discharge Plan and Services Expected Discharge Plan: Skilled Nursing Facility     Post Acute Care Choice: Skilled Nursing Facility Living arrangements for the past 2 months: Single Family Home                                       Social Determinants of Health (SDOH) Interventions    Readmission Risk Interventions No flowsheet data found.

## 2019-06-08 NOTE — Progress Notes (Signed)
Family Medicine Teaching Service Daily Progress Note Intern Pager: [phone]  Patient name: Douglas [name] Medical record number: [id number] Date of birth: 05-Jun-1950 Age: 69 y.o. Gender: male  Primary Care Provider: Patient, No Pcp Per Consultants: None Code Status: Full  Pt Overview and Major Events to Date:  01/03-admitted  Assessment and Plan: Douglas [name] is a 69 y.o. male presenting with AMS. PMH is significant for HTN, DM Type II, CVA with Rt hemiplegia  AMS- resolved and now at baseline, prior hypocalcemia diagnosis potentially lab error Vital signs stable.  Patient at baseline -Ionized calcium normal -Monitor vital signs -Continuous cardiac monitoring  AKI: sCr 1.42 from baseline ~1, unknown cause ?reduced intake.  No abdominal tenderness expressed although patient has reduced communication at baseline -can stop acei temporarily -assist with feeds in case difficulty self feeding  H/o Seizure -stable, no new seizures -Continue Lamictal -Seizure precautions  HTN- stable -Continue Norvasc 10 mg daily -Continue Zestril 40 mg twice daily -Continue metoprolol succinate 25 mg daily -Continue hydralazine 50 mg 3 times daily -Monitor blood pressure  DM Type II-HbA1c 6.7 CBGs overnight 148-146.  Required 7 units insulin coverage yesterday. -Continue to hold glipizide -Continue sensitive sliding scale insulin coverage -CBG monitoring  H/O CVA with Rt Hemiplegia Pt at baseline per daughter. Home medication Plavix 75mg  daily and EC ASA  325 mg daily.  CT head negative for acute bleed. -Continue home medication -PT/OT  BPH Stable.  Home medication Flomax 0.4 mg daily -Continue home medication  FEN/GI:  -Dysphagia III diet Prophylaxis: -Lovenox  Disposition: SNF  Subjective:  No complaints, at baseline from prior report of behavior/communication  Objective: Temp:  [97.4 F (36.3 C)-99.1 F (37.3 C)] 98.6 F (37 C) (02/06 0446) Pulse Rate:   [79-108] 79 (02/06 0446) Resp:  [17-18] 18 (02/06 0446) BP: (112-174)/(74-103) 122/74 (02/06 0446) SpO2:  [98 %-100 %] 98 % (02/06 0446)   Physical Exam: General: 69 year old male in no acute distress Cardiovascular: Regular rate and rhythm, no murmurs appreciated Respiratory: Chest clear to auscultation bilaterally, no wheezes or crackles noted Abdomen: Soft, nontender, nondistended, bowel sounds present Extremities: Right side hemiplegia, no lower extremity edema.  Laboratory: Recent Labs  Lab 06/05/19 1701 06/06/19 0441 06/08/19 0517  WBC 5.8 6.6 6.5  HGB 14.7 15.5 13.0  HCT 45.3 47.8 40.1  PLT 200 182 189   Recent Labs  Lab 06/05/19 1352 06/05/19 1352 06/05/19 1509 06/05/19 1509 06/05/19 1822 06/06/19 0441 06/08/19 0517  NA 141   < > 137  --   --  142 139  K 3.5   < > 5.0  --   --  4.6 4.2  CL 110   < > 112*  --   --  102 105  CO2 24  --   --   --   --  23 25  BUN 12   < > 18  --   --  13 21  CREATININE 0.77   < > 0.70   < > 0.90 0.92 1.42*  CALCIUM 9.2  --   --   --  9.3 9.6 9.4  PROT 6.7  --   --   --   --  6.7  --   BILITOT 1.1  --   --   --   --  1.3*  --   ALKPHOS 83  --   --   --   --  97  --   ALT 21  --   --   --   --  21  --   AST 13*  --   --   --   --  24  --   GLUCOSE 161*   < > 138*  --   --  140* 194*   < > = values in this interval not displayed.      Imaging/Diagnostic Tests: No results found.  Sherene Sires, DO 06/08/2019, 6:37 AM PGY-3, [place] Intern pager: [phone], text pages welcome

## 2019-06-09 LAB — BASIC METABOLIC PANEL
Anion gap: 7 (ref 5–15)
BUN: 17 mg/dL (ref 8–23)
CO2: 25 mmol/L (ref 22–32)
Calcium: 9.2 mg/dL (ref 8.9–10.3)
Chloride: 105 mmol/L (ref 98–111)
Creatinine, Ser: 1.1 mg/dL (ref 0.61–1.24)
GFR calc Af Amer: >60 mL/min (ref >60)
GFR calc non Af Amer: >60 mL/min (ref >60)
Glucose, Bld: 188 mg/dL — ABNORMAL HIGH (ref 70–99)
Potassium: 4.3 mmol/L (ref 3.5–5.1)
Sodium: 137 mmol/L (ref 135–145)

## 2019-06-09 LAB — GLUCOSE, CAPILLARY
Glucose-Capillary: 189 mg/dL — ABNORMAL HIGH (ref 70–99)
Glucose-Capillary: 211 mg/dL — ABNORMAL HIGH (ref 70–99)
Glucose-Capillary: 227 mg/dL — ABNORMAL HIGH (ref 70–99)
Glucose-Capillary: 171 mg/dL — ABNORMAL HIGH (ref 70–99)

## 2019-06-09 MED ORDER — VITAMIN D3 25 MCG PO TABS: 500.0000 [IU] | ORAL_TABLET | Freq: Every day | ORAL | 0 refills | Status: DC

## 2019-06-09 MED ORDER — ATORVASTATIN CALCIUM 40 MG PO TABS: 40.0000 mg | ORAL_TABLET | Freq: Every day | ORAL | 1 refills | Status: DC

## 2019-06-09 MED ORDER — ZINC OXIDE 40 % EX OINT: TOPICAL_OINTMENT | Freq: Two times a day (BID) | CUTANEOUS | 0 refills | Status: DC | PRN

## 2019-06-09 MED ORDER — CALCIUM CITRATE 950 (200 CA) MG PO TABS: 500.0000 mg | ORAL_TABLET | Freq: Every day | ORAL | 0 refills | Status: DC

## 2019-06-09 NOTE — Discharge Instructions (Signed)
Drink plenty of fluids to avoid dehydration and kidney injury  Zestril was put on hold as it can worsen kidney injury. Follow up with your PCP for monitoring

## 2019-06-09 NOTE — Progress Notes (Signed)
Family Medicine Teaching Service Daily Progress Note Intern Pager: [phone]  Patient name: Douglas [name] Medical record number: [id number] Date of birth: 1950/07/28 Age: 69 y.o. Gender: male  Primary Care Provider: Patient, No Pcp Per Consultants: None Code Status: Full  Pt Overview and Major Events to Date:  01/03-admitted  Assessment and Plan: Douglas [name] is a 69 y.o. male presenting with AMS. PMH is significant for HTN, DM Type II, CVA with Rt hemiplegia  AMS- resolved and now at baseline, prior hypocalcemia diagnosis potentially lab error Vital signs stable.  Patient is at baseline.  Serum calcium 9.2 -Cardiac monitoring -Vital signs per unit  AKI: Resolved Creatinine 1.10 sCr 1.42 from baseline ~1, unknown cause ?reduced intake.  -can stop acei temporarily Received 500 mL bolus fluid yesterday. -Patient will need assistance with feeding to ensure adequate intake. -Continue restart ACE inhibitor on discharge  H/o Seizure -stable, no new seizures No seizure activity overnight -Continue Lamictal -Seizure precautions  HTN- stable -Continue Norvasc 10 mg daily -Zestril 40 mg twice daily, held due to AKI that has now resolved. -Continue metoprolol succinate 25 mg daily -Continue hydralazine 50 mg 3 times daily -Monitor blood pressure  DM Type II-HbA1c 6.7 CBGs overnight 175-188.  Required 10 units insulin coverage yesterday -Continue to hold glipizide -Continue sensitive sliding scale insulin coverage. -CBG monitoring  H/O CVA with Rt Hemiplegia Daughter reports patient is at baseline.  Home medications include Plavix 75 mg daily and EC ASA 325 mg daily -Continue home medication -PT and OT  BPH Stable.  Home medication Flomax 0.4 mg daily -Continue Flomax  FEN/GI:  -Dysphagia III diet Prophylaxis: -Lovenox  Disposition: Discharged later today with daughter.  Subjective:  No acute events overnight.  Patient back to  baseline.  Objective: Temp:  [97.8 F (36.6 C)-98.7 F (37.1 C)] 98.4 F (36.9 C) (02/07 0329) Pulse Rate:  [76-92] 76 (02/07 0329) Resp:  [18-19] 18 (02/07 0329) BP: (135-152)/(75-87) 148/75 (02/07 0329) SpO2:  [99 %-100 %] 100 % (02/07 0329)   Physical Exam: General: 69 year old male in no acute distress Cardiovascular: Regular rate and rhythm, no murmurs or gallops appreciated Respiratory: Chest clear to auscultation bilaterally.  No wheezes or crackles noted, no increased work of breathing Abdomen: Soft, nontender, nondistended, bowel sounds present, no organomegaly Extremities: Right-sided hemiplegia, no lower extremity edema noted.  Laboratory: Recent Labs  Lab 06/05/19 1701 06/06/19 0441 06/08/19 0517  WBC 5.8 6.6 6.5  HGB 14.7 15.5 13.0  HCT 45.3 47.8 40.1  PLT 200 182 189   Recent Labs  Lab 06/05/19 1352 06/05/19 1509 06/06/19 0441 06/08/19 0517 06/09/19 0508  NA 141   < > 142 139 137  K 3.5   < > 4.6 4.2 4.3  CL 110   < > 102 105 105  CO2 24   < > 23 25 25   BUN 12   < > 13 21 17   CREATININE 0.77   < > 0.92 1.42* 1.10  CALCIUM 9.2   < > 9.6 9.4 9.2  PROT 6.7  --  6.7  --   --   BILITOT 1.1  --  1.3*  --   --   ALKPHOS 83  --  97  --   --   ALT 21  --  21  --   --   AST 13*  --  24  --   --   GLUCOSE 161*   < > 140* 194* 188*   < > =  values in this interval not displayed.      Imaging/Diagnostic Tests: No results found.  Dana Allan, MD 06/09/2019, 7:56 AM PGY-1, [facility] Family Medicine FPTS Intern pager: [phone], text pages welcome

## 2019-06-09 NOTE — Plan of Care (Signed)
Adequate for discharge.

## 2019-06-09 NOTE — Care Management (Signed)
Spoke w patient's daughter. Verified address, she will be home to let patient in at 6:30 with PTAR. PTAR forms printed and placed on chart. PTAR scheduled for 6:30

## 2019-06-15 ENCOUNTER — Inpatient Hospital Stay (HOSPITAL_COMMUNITY): Payer: Medicare Other

## 2019-06-15 ENCOUNTER — Emergency Department (HOSPITAL_COMMUNITY): Payer: Medicare Other

## 2019-06-15 ENCOUNTER — Other Ambulatory Visit: Payer: Self-pay

## 2019-06-15 ENCOUNTER — Encounter (HOSPITAL_COMMUNITY): Payer: Self-pay

## 2019-06-15 ENCOUNTER — Inpatient Hospital Stay (HOSPITAL_COMMUNITY)
Admission: EM | Admit: 2019-06-15 | Discharge: 2019-06-21 | DRG: 092 | Disposition: A | Discharge status: Home Health | Payer: Medicare Other | Attending: Family Medicine | Admitting: Family Medicine

## 2019-06-15 DIAGNOSIS — Z7902 Long term (current) use of antithrombotics/antiplatelets: Secondary | ICD-10-CM | POA: Diagnosis not present

## 2019-06-15 DIAGNOSIS — Z91018 Allergy to other foods: Secondary | ICD-10-CM

## 2019-06-15 DIAGNOSIS — I10 Essential (primary) hypertension: Secondary | ICD-10-CM | POA: Diagnosis present

## 2019-06-15 DIAGNOSIS — N400 Enlarged prostate without lower urinary tract symptoms: Secondary | ICD-10-CM | POA: Diagnosis present

## 2019-06-15 DIAGNOSIS — I69320 Aphasia following cerebral infarction: Secondary | ICD-10-CM

## 2019-06-15 DIAGNOSIS — I69392 Facial weakness following cerebral infarction: Secondary | ICD-10-CM

## 2019-06-15 DIAGNOSIS — E785 Hyperlipidemia, unspecified: Secondary | ICD-10-CM | POA: Diagnosis present

## 2019-06-15 DIAGNOSIS — I6990 Unspecified sequelae of unspecified cerebrovascular disease: Secondary | ICD-10-CM | POA: Diagnosis not present

## 2019-06-15 DIAGNOSIS — I69391 Dysphagia following cerebral infarction: Secondary | ICD-10-CM

## 2019-06-15 DIAGNOSIS — Z20822 Contact with and (suspected) exposure to covid-19: Secondary | ICD-10-CM | POA: Diagnosis present

## 2019-06-15 DIAGNOSIS — G92 Toxic encephalopathy: Secondary | ICD-10-CM | POA: Diagnosis present

## 2019-06-15 DIAGNOSIS — M79604 Pain in right leg: Secondary | ICD-10-CM | POA: Diagnosis present

## 2019-06-15 DIAGNOSIS — Z7982 Long term (current) use of aspirin: Secondary | ICD-10-CM | POA: Diagnosis not present

## 2019-06-15 DIAGNOSIS — E1122 Type 2 diabetes mellitus with diabetic chronic kidney disease: Secondary | ICD-10-CM | POA: Diagnosis not present

## 2019-06-15 DIAGNOSIS — I639 Cerebral infarction, unspecified: Secondary | ICD-10-CM | POA: Diagnosis not present

## 2019-06-15 DIAGNOSIS — M79671 Pain in right foot: Secondary | ICD-10-CM | POA: Diagnosis present

## 2019-06-15 DIAGNOSIS — E1159 Type 2 diabetes mellitus with other circulatory complications: Secondary | ICD-10-CM | POA: Diagnosis present

## 2019-06-15 DIAGNOSIS — Z888 Allergy status to other drugs, medicaments and biological substances status: Secondary | ICD-10-CM | POA: Diagnosis not present

## 2019-06-15 DIAGNOSIS — Z8249 Family history of ischemic heart disease and other diseases of the circulatory system: Secondary | ICD-10-CM

## 2019-06-15 DIAGNOSIS — G40909 Epilepsy, unspecified, not intractable, without status epilepticus: Secondary | ICD-10-CM | POA: Diagnosis present

## 2019-06-15 DIAGNOSIS — Z87892 Personal history of anaphylaxis: Secondary | ICD-10-CM | POA: Diagnosis not present

## 2019-06-15 DIAGNOSIS — Z881 Allergy status to other antibiotic agents status: Secondary | ICD-10-CM

## 2019-06-15 DIAGNOSIS — Z8673 Personal history of transient ischemic attack (TIA), and cerebral infarction without residual deficits: Secondary | ICD-10-CM | POA: Diagnosis not present

## 2019-06-15 DIAGNOSIS — R404 Transient alteration of awareness: Secondary | ICD-10-CM | POA: Diagnosis not present

## 2019-06-15 DIAGNOSIS — N182 Chronic kidney disease, stage 2 (mild): Secondary | ICD-10-CM | POA: Diagnosis not present

## 2019-06-15 DIAGNOSIS — G9389 Other specified disorders of brain: Secondary | ICD-10-CM | POA: Diagnosis present

## 2019-06-15 DIAGNOSIS — G9349 Other encephalopathy: Secondary | ICD-10-CM | POA: Diagnosis present

## 2019-06-15 DIAGNOSIS — Z87440 Personal history of urinary (tract) infections: Secondary | ICD-10-CM | POA: Diagnosis not present

## 2019-06-15 DIAGNOSIS — E878 Other disorders of electrolyte and fluid balance, not elsewhere classified: Secondary | ICD-10-CM | POA: Diagnosis present

## 2019-06-15 DIAGNOSIS — I69351 Hemiplegia and hemiparesis following cerebral infarction affecting right dominant side: Secondary | ICD-10-CM

## 2019-06-15 DIAGNOSIS — Z794 Long term (current) use of insulin: Secondary | ICD-10-CM

## 2019-06-15 DIAGNOSIS — R4182 Altered mental status, unspecified: Secondary | ICD-10-CM | POA: Diagnosis not present

## 2019-06-15 DIAGNOSIS — I69398 Other sequelae of cerebral infarction: Secondary | ICD-10-CM | POA: Diagnosis not present

## 2019-06-15 DIAGNOSIS — R9401 Abnormal electroencephalogram [EEG]: Secondary | ICD-10-CM | POA: Diagnosis not present

## 2019-06-15 DIAGNOSIS — E1142 Type 2 diabetes mellitus with diabetic polyneuropathy: Secondary | ICD-10-CM | POA: Diagnosis present

## 2019-06-15 DIAGNOSIS — I152 Hypertension secondary to endocrine disorders: Secondary | ICD-10-CM | POA: Diagnosis present

## 2019-06-15 DIAGNOSIS — N289 Disorder of kidney and ureter, unspecified: Secondary | ICD-10-CM | POA: Diagnosis not present

## 2019-06-15 DIAGNOSIS — Z79899 Other long term (current) drug therapy: Secondary | ICD-10-CM

## 2019-06-15 DIAGNOSIS — M79606 Pain in leg, unspecified: Secondary | ICD-10-CM

## 2019-06-15 LAB — COMPREHENSIVE METABOLIC PANEL
ALT: 23 U/L (ref 0–44)
AST: 24 U/L (ref 15–41)
Albumin: 3.6 g/dL (ref 3.5–5.0)
Alkaline Phosphatase: 77 U/L (ref 38–126)
Anion gap: 11 (ref 5–15)
BUN: 13 mg/dL (ref 8–23)
CO2: 23 mmol/L (ref 22–32)
Calcium: 8.5 mg/dL — ABNORMAL LOW (ref 8.9–10.3)
Chloride: 110 mmol/L (ref 98–111)
Creatinine, Ser: 0.85 mg/dL (ref 0.61–1.24)
GFR calc Af Amer: >60 mL/min (ref >60)
GFR calc non Af Amer: >60 mL/min (ref >60)
Glucose, Bld: 150 mg/dL — ABNORMAL HIGH (ref 70–99)
Potassium: 4.8 mmol/L (ref 3.5–5.1)
Sodium: 144 mmol/L (ref 135–145)
Total Bilirubin: 0.7 mg/dL (ref 0.3–1.2)
Total Protein: 6.6 g/dL (ref 6.5–8.1)

## 2019-06-15 LAB — URINALYSIS, ROUTINE W REFLEX MICROSCOPIC
Bilirubin Urine: NEGATIVE
Glucose, UA: NEGATIVE mg/dL
Hgb urine dipstick: NEGATIVE
Ketones, ur: NEGATIVE mg/dL
Leukocytes,Ua: NEGATIVE
Nitrite: NEGATIVE
Protein, ur: NEGATIVE mg/dL
Specific Gravity, Urine: 1.011 (ref 1.005–1.030)
pH: 7 (ref 5.0–8.0)

## 2019-06-15 LAB — CBC
HCT: 46.2 % (ref 39.0–52.0)
Hemoglobin: 14.6 g/dL (ref 13.0–17.0)
MCH: 30 pg (ref 26.0–34.0)
MCHC: 31.6 g/dL (ref 30.0–36.0)
MCV: 94.9 fL (ref 80.0–100.0)
Platelets: 233 10*3/uL (ref 150–400)
RBC: 4.87 MIL/uL (ref 4.22–5.81)
RDW: 13.2 % (ref 11.5–15.5)
WBC: 5.4 10*3/uL (ref 4.0–10.5)
nRBC: 0 % (ref 0.0–0.2)

## 2019-06-15 LAB — CBG MONITORING, ED: Glucose-Capillary: 169 mg/dL — ABNORMAL HIGH (ref 70–99)

## 2019-06-15 MED ORDER — SODIUM CHLORIDE 0.9 % IV SOLN: INTRAVENOUS | Status: DC

## 2019-06-15 MED ORDER — AMLODIPINE BESYLATE 10 MG PO TABS
10.0000 mg | ORAL_TABLET | Freq: Every day | ORAL | Status: DC
  Administered 2019-06-16 – 2019-06-21 (×6): 10 mg via ORAL
  Filled 2019-06-15 (×6): qty 1

## 2019-06-15 MED ORDER — LISINOPRIL 20 MG PO TABS
20.0000 mg | ORAL_TABLET | Freq: Two times a day (BID) | ORAL | Status: DC
  Administered 2019-06-16 – 2019-06-21 (×11): 20 mg via ORAL
  Filled 2019-06-15 (×4): qty 1
  Filled 2019-06-15: qty 2
  Filled 2019-06-15 (×6): qty 1

## 2019-06-15 MED ORDER — ASPIRIN EC 325 MG PO TBEC
325.0000 mg | DELAYED_RELEASE_TABLET | Freq: Every day | ORAL | Status: DC
  Administered 2019-06-16 – 2019-06-21 (×6): 325 mg via ORAL
  Filled 2019-06-15 (×6): qty 1

## 2019-06-15 MED ORDER — LAMOTRIGINE 100 MG PO TABS
100.0000 mg | ORAL_TABLET | Freq: Two times a day (BID) | ORAL | Status: DC
  Administered 2019-06-16 – 2019-06-21 (×11): 100 mg via ORAL
  Filled 2019-06-15 (×11): qty 1

## 2019-06-15 MED ORDER — TAMSULOSIN HCL 0.4 MG PO CAPS
0.4000 mg | ORAL_CAPSULE | Freq: Every day | ORAL | Status: DC
  Administered 2019-06-16 – 2019-06-21 (×6): 0.4 mg via ORAL
  Filled 2019-06-15 (×6): qty 1

## 2019-06-15 MED ORDER — ENOXAPARIN SODIUM 40 MG/0.4ML ~~LOC~~ SOLN
40.0000 mg | Freq: Every day | SUBCUTANEOUS | Status: DC
  Administered 2019-06-16 – 2019-06-21 (×6): 40 mg via SUBCUTANEOUS
  Filled 2019-06-15 (×6): qty 0.4

## 2019-06-15 MED ORDER — CLOPIDOGREL BISULFATE 75 MG PO TABS
75.0000 mg | ORAL_TABLET | Freq: Every day | ORAL | Status: DC
  Administered 2019-06-16 – 2019-06-21 (×6): 75 mg via ORAL
  Filled 2019-06-15 (×6): qty 1

## 2019-06-15 MED ORDER — ACETAMINOPHEN 325 MG PO TABS: 650.0000 mg | ORAL_TABLET | Freq: Four times a day (QID) | ORAL | Status: DC | PRN

## 2019-06-15 MED ORDER — HYDRALAZINE HCL 50 MG PO TABS
75.0000 mg | ORAL_TABLET | Freq: Three times a day (TID) | ORAL | Status: DC
  Administered 2019-06-16 – 2019-06-21 (×16): 75 mg via ORAL
  Filled 2019-06-15 (×16): qty 1

## 2019-06-15 MED ORDER — METOPROLOL SUCCINATE ER 25 MG PO TB24
25.0000 mg | ORAL_TABLET | Freq: Every day | ORAL | Status: DC
  Administered 2019-06-16 – 2019-06-18 (×3): 25 mg via ORAL
  Filled 2019-06-15 (×3): qty 1

## 2019-06-15 MED ORDER — SODIUM CHLORIDE 0.9% FLUSH: 3.0000 mL | Freq: Once | INTRAVENOUS | Status: DC

## 2019-06-15 MED ORDER — CALCIUM CITRATE 950 (200 CA) MG PO TABS
500.0000 mg | ORAL_TABLET | Freq: Every day | ORAL | Status: DC
  Administered 2019-06-16 – 2019-06-21 (×6): 475 mg via ORAL
  Filled 2019-06-15 (×7): qty 1

## 2019-06-15 MED ORDER — CLONIDINE HCL 0.1 MG PO TABS: 0.1000 mg | ORAL_TABLET | Freq: Every evening | ORAL | Status: DC

## 2019-06-15 MED ORDER — PANTOPRAZOLE SODIUM 40 MG PO TBEC
40.0000 mg | DELAYED_RELEASE_TABLET | Freq: Every day | ORAL | Status: DC
  Administered 2019-06-16 – 2019-06-21 (×6): 40 mg via ORAL
  Filled 2019-06-15 (×6): qty 1

## 2019-06-15 NOTE — H&P (Signed)
[facility] Admission History and Physical Service Pager: [phone]  Patient name: Douglas [name]. Medical record number: [id number] Date of birth: 1950/11/30 Age: 69 y.o. Gender: male  Primary Care Provider: Jilda Panda, MD Consultants: none Code Status: full Preferred Emergency Contact: daughter  Chief Complaint: AMS  Assessment and Plan: Douglas [name]. is a 69 y.o. male presenting with AMS today . PMH is significant for AMS, h/o CVA with right-sided weakness, HTN  AMS- last known normal ~11am today, outside of tPA window on arrival. Head CT negative for acute changes. According to description given by daughter over the phone, patient on exam may be at his baseline now. Has residual right sided weakness and ability to follow some simple commands and respond to some questions with "yes" or laughing. His altered status may have been 2/2 environment as he was in a home without heat today considering his quick recovery. Temperature on presentation to ED was 98.8. Another explanation could be his mild hypocalcemia (8.5) which was thought to contribute to similar presentation earlier this month. Albumin normal today, vit D 30.23 on 2/3. Patient has normal vital signs, CBC, making infection less likely but patient has some nasal discharge on exam.  - admit to med surg, attending Dr. Owens Shark - brain MRI to assess for CVA - ionized calcium - PT/OT - maintenance IV fluids - bedside swallow study - continue home calcium supplement - if not returned back to baseline and MRI clear, consider checking chest xray for infection  Possible leg pain- patient had been endorsing to his daughter and on our exam that he has pain. In both cases he has been pointing to his right leg. Exam was negative for signs of injury or infection. Daughter endorsed some swelling of the right foot two days ago but was not present on exam today. Denied known trauma. Could consider blood  clot but more likely related to his paralysis. - right foot xray - tylenol PRN  H/o CVA with residual right-sided hemiplegia- patient was recommended for SNF placement after discharge earlier this month but daughter was concerned with the ratings of the approved facilities so took him home instead. He had been doing well with home health until today. Head CT negative and exam negative for new focal findings per report from daughter of baseline. - continue asa, plavix - f/u MRI brain  HTN- hypertensive on presentation and admission. Did not receive evening doses of home meds. 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 hgb A1c 6.7 this month. - glucose monitoring with daily BMP - hold home med. Consider sSSI if elevated BS  BPH with frequent UTIs. Urinalysis negative on presentation - continue tamsulosin - strict I/O   FEN/GI: NPO until swallow study and then dysphagia 3 diet per speech recommendations at recent admission Prophylaxis: lovenox  Disposition: evaluation of AMS and likely placement in SNF  History of Present Illness:  Douglas Dulworth. is a 69 y.o. male presenting with altered mental status.  His daughter reports that she was concerned that he was not responding like normal.  He was also complaining of right leg pain.  She says that his eyes were rolling back and he kept pointing to his right leg.  It also seemed like he was trying to tell her something and couldn't.  His baseline includes pointing to things he needs and  the ability to say "yes" and appears to understand things.  He did not seem to understand his daughter today.  She denies any recent falls but does say that his right foot was swollen.  His power in his home went out today which left him without heat. She states that he was freezing when she got there which she thinks contributed to his  altered state. He did not receive his nighttime medications.   Review Of Systems: Per HPI with the following additions:  Review of Systems  Unable to perform ROS: Patient nonverbal    Patient Active Problem List   Diagnosis Date Noted  . Pressure injury of skin 06/08/2019  . AMS (altered mental status) 06/05/2019  . Ischemic stroke (HCC) 03/05/2019  . UTI (urinary tract infection) 03/05/2019  . Acute blood loss anemia   . Labile blood pressure   . 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 Labile blood glucose   . Global aphasia   . Cerebral edema (HCC) 04/04/2018  . Renal insufficiency 04/04/2018  . 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 Acute lower UTI   . Type 2 diabetes mellitus with peripheral neuropathy (HCC)   . Acute embolic stroke (HCC)   . Diabetes 1.5, managed as type 2 (HCC)   . Essential hypertension   . Dyslipidemia   . History of CVA with residual deficit   . Dysphagia, post-stroke     Past Medical History: Past Medical History:  Diagnosis Date  . Diabetes mellitus without complication (HCC)   . Hypertension   . Stroke [facility])     Past Surgical History: Past Surgical History:  Procedure Laterality Date  . IR PATIENT EVAL TECH 0-60 MINS  03/30/2018  . LOOP RECORDER INSERTION N/A 03/08/2019   Procedure: LOOP RECORDER INSERTION;  Surgeon: Regan Lemming, MD;  Location: [facility] INVASIVE CV LAB;  Service: Cardiovascular;  Laterality: N/A;  . TEE WITHOUT CARDIOVERSION N/A 04/02/2018   Procedure: TRANSESOPHAGEAL ECHOCARDIOGRAM (TEE);  Surgeon: [name], MD;  Location: [facility] ENDOSCOPY;  Service: Cardiovascular;  Laterality: N/A;    Social History: Social History   Tobacco Use  . Smoking status: Never Smoker  . Smokeless tobacco: Never Used  Substance Use Topics  . Alcohol use: Not Currently  . Drug use: Never   Additional social history: lives with daughter Please also refer to  relevant sections of EMR.  Family History: Family History  Problem Relation Age of Onset  . Hypertension Mother   . Hypertension Father     Allergies and Medications: Allergies  Allergen Reactions  . Ciprofloxacin Anaphylaxis  . Keppra [Levetiracetam] Other (See Comments)    Makes the patient very agitated and "not like himself"  . Other Nausea And Vomiting and Other (See Comments)    [place]   No current facility-administered medications on file prior to encounter.   Current Outpatient Medications on File Prior to Encounter  Medication Sig Dispense Refill  . amLODipine (NORVASC) 10 MG tablet Take 1 tablet (10 mg total) by mouth daily. 30 tablet 3  . ascorbic acid (VITAMIN C) 500 MG tablet Take 500 mg by mouth daily as needed (if cold-like symptoms are present).    Marland Kitchen aspirin EC 325 MG EC tablet Take 1 tablet (325 mg total) by mouth daily. 30 tablet 0  . atorvastatin (LIPITOR) 40 MG tablet Take 1 tablet (40 mg total) by mouth daily at 6 PM. 30 tablet 1  .  baclofen (LIORESAL) 10 MG tablet Take 20 mg by mouth 2 (two) times daily.     . calcium citrate (CALCITRATE - DOSED IN MG ELEMENTAL CALCIUM) 950 (200 Ca) MG tablet Take 0.5 tablets (475 mg total) by mouth daily. 30 tablet 0  . cholecalciferol (VITAMIN D) 25 MCG tablet Take 0.5 tablets (500 Units total) by mouth daily. 30 tablet 0  . cloNIDine (CATAPRES) 0.2 MG tablet Take 0.1 mg by mouth every evening.     . clopidogrel (PLAVIX) 75 MG tablet Take 1 tablet (75 mg total) by mouth daily. 30 tablet 0  . glipiZIDE (GLUCOTROL) 10 MG tablet Take 1 tablet (10 mg total) by mouth 2 (two) times daily. 60 tablet 3  . hydrALAZINE (APRESOLINE) 25 MG tablet Take 75 mg by mouth 3 (three) times daily.     . insulin detemir (LEVEMIR) 100 UNIT/ML injection Inject 0.16 mLs (16 Units total) into the skin daily. (Patient taking differently: Inject 16 Units into the skin daily as needed (if BGL 125 or greater). )    . lamoTRIgine (LAMICTAL) 100 MG tablet  Take 100 mg by mouth 2 (two) times daily.    Marland Kitchen lisinopril (ZESTRIL) 20 MG tablet Take 20 mg by mouth 2 (two) times daily.    Marland Kitchen liver oil-zinc oxide (DESITIN) 40 % ointment Apply topically 2 (two) times daily as needed for irritation (for protection). 56.7 g 0  . metoprolol succinate (TOPROL-XL) 25 MG 24 hr tablet Take 25 mg by mouth daily.     Marland Kitchen NOVOLOG FLEXPEN 100 UNIT/ML FlexPen Inject 3 Units into the skin See admin instructions. Inject 3-4 units into the skin up to two times a day before meals as needed for a BGL of 200 or greater- per sliding scale    . pantoprazole (PROTONIX) 40 MG tablet Take 1 tablet (40 mg total) by mouth daily. 30 tablet 0  . tamsulosin (FLOMAX) 0.4 MG CAPS capsule Take 0.4 mg by mouth daily after supper.     Marland Kitchen ACCU-CHEK AVIVA PLUS test strip daily.      Objective: BP (!) 150/83   Pulse 84   Temp 98.8 F (37.1 C) (Oral)   Resp 12   SpO2 99%  Exam: General: laying supine in no apparent distress Eyes: open and tracking movements, PERRL ENTM: clear nasal discharge bilaterally with excess saliva Neck: non-tender, no lymphadenopathy Cardiovascular: RRR, no murmur appreciated Respiratory: CTAB, no respiratory distress Gastrointestinal: soft, non-tender MSK: thin. Positive for contracted R hand. Negative LE edema, lesions, deformities Derm: no rashes or lesions Neuro: alert, able to follow simple commands. Right sided extremities 0/5 strength. 5/5 strength on L extremities.  Psych: non-verbal other than responding with "yes" to questions occasionally.   Labs and Imaging: CBC BMET  Recent Labs  Lab 06/15/19 1945  WBC 5.4  HGB 14.6  HCT 46.2  PLT 233   Recent Labs  Lab 06/15/19 1945  NA 144  K 4.8  CL 110  CO2 23  BUN 13  CREATININE 0.85  GLUCOSE 150*  CALCIUM 8.5*     EKG: NSR  CT Head Wo Contrast  Result Date: 06/15/2019 CLINICAL DATA:  Altered level of consciousness, right-sided weakness and speech deficits EXAM: CT HEAD WITHOUT CONTRAST  TECHNIQUE: Contiguous axial images were obtained from the base of the skull through the vertex without intravenous contrast. COMPARISON:  06/05/2019 FINDINGS: Brain: Chronic left cerebral encephalomalacia consistent with previous infarct. No signs of acute infarct or hemorrhage. There is ex vacuo dilatation of the  lateral ventricle, stable. Midline structures are unremarkable. No acute extra-axial fluid collections. No mass effect. Vascular: No hyperdense vessel or unexpected calcification. Skull: Normal. Negative for fracture or focal lesion. Sinuses/Orbits: No acute finding. Other: None IMPRESSION: 1. Chronic encephalomalacia related to previous left PCA and MCA territory infarcts. 2. No acute intracranial process. Electronically Signed   By: Sharlet Salina M.D.   On: 06/15/2019 19:05   DG Foot 2 Views Right  Result Date: 06/15/2019 CLINICAL DATA:  Pain EXAM: RIGHT FOOT - 2 VIEW COMPARISON:  None. FINDINGS: The osseous structures appear diffusely demineralized which may limit detection of small or nondisplaced fractures. No acute bony abnormality. Specifically, no fracture, subluxation, or dislocation. Irregularity of the nail bed of the first digit with questionable soft tissue disruption. No soft tissue gas or foreign body is seen. Clawtoe deformities of the second through fifth digits. Corticated accessory os peroneum is noted. Apparent pes planus deformity though incompletely assessed on nonweightbearing films. IMPRESSION: 1. No acute bony abnormality. 2. Irregularity of the nail bed of the first digit with questionable soft tissue disruption, correlate with visual inspection. 3. Clawtoe deformities of the second through fifth digits. 4. Apparent pes planus, incompletely assessed on nonweightbearing views. Electronically Signed   By: Kreg Shropshire M.D.   On: 06/15/2019 22:37    Leeroy Bock, DO 06/15/2019, 8:55 PM PGY-2, [facility] FPTS Intern pager: [phone], text pages  welcome

## 2019-06-15 NOTE — ED Triage Notes (Signed)
Pt bib [facility] from home with AMS. Pt has hx of CVA in November with R sided weakness and speech deficits. Per EMS pt's daughter said pt has been "slower to respond" than normal, however, is otherwise neurologically at his baseline. Pt also has hx of UTIs and presents to ED w/ foul smelling urine. Pt unable to answer orientation questions at this time.

## 2019-06-15 NOTE — ED Notes (Signed)
Attempted to call report to 3W 

## 2019-06-15 NOTE — ED Provider Notes (Addendum)
[facility] EMERGENCY DEPARTMENT Provider Note   CSN: [id number] Arrival date & time: 06/15/19  1733     History Chief Complaint  Patient presents with  . Altered Mental Status    Douglas [name]. is a 69 y.o. male with a past medical history significant for diabetes mellitus type 2, hypertension, history of CVA in 2019 with residual right-sided deficits, and history of UTIs who presents to the ED via EMS due to altered mental status.  Daughter is at bedside and provided the history.  Daughter states her father has been less responsive but has gradually worsened over the past few hours.  She notes his last known normal was around 11-11 30 this morning.  She also notes he has had decreased urination over the past few days.  He has a history of UTIs with his last UTI in November 2020.  Daughter also notes that patient was pointing to his right leg earlier today.  Per daughter, no worsening of right-sided weakness.   Level 5 caveat secondary to cognitive status. Past Medical History:  Diagnosis Date  . Diabetes mellitus without complication (HCC)   . Hypertension   . Stroke [facility])     Patient Active Problem List   Diagnosis Date Noted  . Pressure injury of skin 06/08/2019  . AMS (altered mental status) 06/05/2019  . Ischemic stroke (HCC) 03/05/2019  . UTI (urinary tract infection) 03/05/2019  . Acute blood loss anemia   . Labile blood pressure   . 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 Labile blood glucose   . Global aphasia   . Cerebral edema (HCC) 04/04/2018  . Renal insufficiency 04/04/2018  . 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 Acute lower UTI   . Type 2 diabetes mellitus with peripheral neuropathy (HCC)   . Acute embolic stroke (HCC)   . Diabetes 1.5, managed as type 2 (HCC)   . Essential hypertension   . Dyslipidemia   . History of CVA with residual deficit   . Dysphagia,  post-stroke     Past Surgical History:  Procedure Laterality Date  . IR PATIENT EVAL TECH 0-60 MINS  03/30/2018  . LOOP RECORDER INSERTION N/A 03/08/2019   Procedure: LOOP RECORDER INSERTION;  Surgeon: Regan Lemming, MD;  Location: [facility] INVASIVE CV LAB;  Service: Cardiovascular;  Laterality: N/A;  . TEE WITHOUT CARDIOVERSION N/A 04/02/2018   Procedure: TRANSESOPHAGEAL ECHOCARDIOGRAM (TEE);  Surgeon: [name], MD;  Location: [facility] ENDOSCOPY;  Service: Cardiovascular;  Laterality: N/A;       Family History  Problem Relation Age of Onset  . Hypertension Mother   . Hypertension Father     Social History   Tobacco Use  . Smoking status: Never Smoker  . Smokeless tobacco: Never Used  Substance Use Topics  . Alcohol use: Not Currently  . Drug use: Never    Home Medications Prior to Admission medications   Medication Sig Start Date End Date Taking? Authorizing Provider  ACCU-CHEK AVIVA PLUS test strip daily. 12/28/18   [provider]  amLODipine (NORVASC) 10 MG tablet Take 1 tablet (10 mg total) by mouth daily. 05/04/18   Angiulli, Mcarthur Rossetti, PA-C  ascorbic acid (VITAMIN C) 500 MG tablet Take 500 mg by mouth daily as needed (if cold-like symptoms are present).    [provider]  aspirin EC 325 MG EC tablet Take 1 tablet (325 mg total) by mouth daily.  04/04/18   Donzetta Starch, NP  atorvastatin (LIPITOR) 40 MG tablet Take 1 tablet (40 mg total) by mouth daily at 6 PM. 06/09/19   Lockamy, Timothy, DO  baclofen (LIORESAL) 10 MG tablet Take 20 mg by mouth 2 (two) times daily.  12/13/18   [provider]  calcium citrate (CALCITRATE - DOSED IN MG ELEMENTAL CALCIUM) 950 (200 Ca) MG tablet Take 0.5 tablets (475 mg total) by mouth daily. 06/10/19   Nuala Alpha, DO  cholecalciferol (VITAMIN D) 25 MCG tablet Take 0.5 tablets (500 Units total) by mouth daily. 06/10/19   Nuala Alpha, DO  cloNIDine (CATAPRES) 0.2 MG tablet Take 0.2 mg by mouth every evening.     [provider]  clopidogrel (PLAVIX) 75 MG tablet Take 1 tablet (75 mg total) by mouth daily. 05/04/18   Angiulli, Lavon Paganini, PA-C  glipiZIDE (GLUCOTROL) 10 MG tablet Take 1 tablet (10 mg total) by mouth 2 (two) times daily. 05/04/18   Angiulli, Lavon Paganini, PA-C  hydrALAZINE (APRESOLINE) 25 MG tablet Take 25 mg by mouth 3 (three) times daily. 05/28/19   [provider]  hydrALAZINE (APRESOLINE) 50 MG tablet Take 50 mg by mouth 3 (three) times daily.    [provider]  insulin detemir (LEVEMIR) 100 UNIT/ML injection Inject 0.16 mLs (16 Units total) into the skin daily. Patient taking differently: Inject 16 Units into the skin daily as needed (if BGL 125 or greater).  03/12/19   Nita Sells, MD  lamoTRIgine (LAMICTAL) 100 MG tablet Take 100 mg by mouth 2 (two) times daily. 11/18/18   [provider]  lisinopril (ZESTRIL) 20 MG tablet Take 20 mg by mouth 2 (two) times daily. 11/18/18   [provider]  liver oil-zinc oxide (DESITIN) 40 % ointment Apply topically 2 (two) times daily as needed for irritation (for protection). 06/09/19   Nuala Alpha, DO  metoprolol succinate (TOPROL-XL) 25 MG 24 hr tablet Take 25 mg by mouth daily.     [provider]  NOVOLOG FLEXPEN 100 UNIT/ML FlexPen Inject 3 Units into the skin See admin instructions. Inject 3-4 units into the skin up to two times a day before meals as needed for a BGL of 200 or greater- per sliding scale 10/01/18   [provider]  pantoprazole (PROTONIX) 40 MG tablet Take 1 tablet (40 mg total) by mouth daily. Patient not taking: Reported on 06/05/2019 05/04/18   Angiulli, Lavon Paganini, PA-C  tamsulosin (FLOMAX) 0.4 MG CAPS capsule Take 0.4 mg by mouth daily after supper.  09/30/18   [provider]    Allergies    Ciprofloxacin, Keppra [levetiracetam], and Other  Review of Systems   Review of Systems  Unable to perform ROS: Patient nonverbal    Physical Exam Updated Vital  Signs BP (!) 164/83   Pulse 93   Temp 98.8 F (37.1 C) (Oral)   Resp 14   SpO2 100%   Physical Exam Vitals and nursing note reviewed.  Constitutional:      General: He is not in acute distress.    Appearance: He is not toxic-appearing.  HENT:     Head: Normocephalic.  Eyes:     Pupils: Pupils are equal, round, and reactive to light.  Neck:     Comments: No meningismus.  Cardiovascular:     Rate and Rhythm: Normal rate and regular rhythm.     Pulses: Normal pulses.     Heart sounds: Normal heart sounds. No murmur. No friction rub.  No gallop.   Pulmonary:     Effort: Pulmonary effort is normal.     Breath sounds: Normal breath sounds.  Abdominal:     General: Abdomen is flat. Bowel sounds are normal. There is no distension.     Palpations: Abdomen is soft.     Tenderness: There is no abdominal tenderness. There is no guarding or rebound.  Musculoskeletal:     Cervical back: Neck supple.     Comments: No lower extremity edema.  Right-sided weakness from previous CVA.  Skin:    General: Skin is warm and dry.  Neurological:     General: No focal deficit present.     Mental Status: He is alert.     Comments: Able to follow short commands.  Unable to communicate.  Giggles during examination.     ED Results / Procedures / Treatments   Labs (all labs ordered are listed, but only abnormal results are displayed) Labs Reviewed  URINE CULTURE  COMPREHENSIVE METABOLIC PANEL  CBC  URINALYSIS, ROUTINE W REFLEX MICROSCOPIC  CBG MONITORING, ED    EKG None  Radiology No results found.  Procedures Procedures (including critical care time)  Medications Ordered in ED Medications  sodium chloride flush (NS) 0.9 % injection 3 mL (has no administration in time range)    ED Course  I have reviewed the triage vital signs and the nursing notes.  Pertinent labs & imaging results that were available during my care of the patient were reviewed by me and considered in my medical  decision making (see chart for details).  Clinical Course as of Jun 15 2055  Sat Jun 15, 2019  2054 Spoke to Dr. Dareen Piano with Family medicine who agrees to admit patient for further evaluation of AMS.    [CA]    Clinical Course User Index [CA] Jesusita Oka   MDM Rules/Calculators/A&P                     69 year old male presents to the ED due to altered mental status.  Patient's daughter at bedside and notes patient has been a little less responsive than normal starting at 11-11:30AM.  Stable vitals.  Patient afebrile, not tachycardic or hypoxic. Right sided residual weakness from previous stroke. Daughter at bedside states weakness is at his baseline. Patient unable to communicate, but able to follow short commands.   UA negative for signs of infection and hematuria. Doubt UTI. Urine culture pending.  CBC reassuring with no leukocytosis.  CMP reassuring with normal renal function.  CT head personally reviewed which demonstrates: 1. Chronic encephalomalacia related to previous left PCA and MCA  territory infarcts.  2. No acute intracranial process.   Discussed case with Dr. Dareen Piano with Family Medicine who agrees to admit patient for further AMS workup. COVID test pending.   Discussed case with Dr. Manus Gunning who evaluated patient at bedside and agrees with assessment and plan.  Final Clinical Impression(s) / ED Diagnoses Final diagnoses:  None    Rx / DC Orders ED Discharge Orders    None       Mannie Stabile, PA-C 06/15/19 2102    Jesusita Oka 06/15/19 2112    Glynn Octave, MD 06/16/19 1155

## 2019-06-16 ENCOUNTER — Other Ambulatory Visit: Payer: Self-pay

## 2019-06-16 ENCOUNTER — Inpatient Hospital Stay (HOSPITAL_COMMUNITY): Payer: Medicare Other

## 2019-06-16 DIAGNOSIS — M79671 Pain in right foot: Secondary | ICD-10-CM

## 2019-06-16 DIAGNOSIS — I69351 Hemiplegia and hemiparesis following cerebral infarction affecting right dominant side: Secondary | ICD-10-CM

## 2019-06-16 DIAGNOSIS — R404 Transient alteration of awareness: Secondary | ICD-10-CM

## 2019-06-16 DIAGNOSIS — R9401 Abnormal electroencephalogram [EEG]: Secondary | ICD-10-CM

## 2019-06-16 DIAGNOSIS — I69391 Dysphagia following cerebral infarction: Secondary | ICD-10-CM

## 2019-06-16 DIAGNOSIS — I10 Essential (primary) hypertension: Secondary | ICD-10-CM

## 2019-06-16 LAB — CBC
HCT: 43.9 % (ref 39.0–52.0)
Hemoglobin: 14.1 g/dL (ref 13.0–17.0)
MCH: 29.7 pg (ref 26.0–34.0)
MCHC: 32.1 g/dL (ref 30.0–36.0)
MCV: 92.4 fL (ref 80.0–100.0)
Platelets: 226 10*3/uL (ref 150–400)
RBC: 4.75 MIL/uL (ref 4.22–5.81)
RDW: 13.1 % (ref 11.5–15.5)
WBC: 6.5 10*3/uL (ref 4.0–10.5)
nRBC: 0 % (ref 0.0–0.2)

## 2019-06-16 LAB — RAPID URINE DRUG SCREEN, HOSP PERFORMED
Amphetamines: NOT DETECTED
Barbiturates: NOT DETECTED
Benzodiazepines: NOT DETECTED
Cocaine: NOT DETECTED
Opiates: NOT DETECTED
Tetrahydrocannabinol: NOT DETECTED

## 2019-06-16 LAB — GLUCOSE, CAPILLARY
Glucose-Capillary: 149 mg/dL — ABNORMAL HIGH (ref 70–99)
Glucose-Capillary: 287 mg/dL — ABNORMAL HIGH (ref 70–99)
Glucose-Capillary: 182 mg/dL — ABNORMAL HIGH (ref 70–99)

## 2019-06-16 LAB — CREATININE, SERUM
Creatinine, Ser: 0.91 mg/dL (ref 0.61–1.24)
GFR calc Af Amer: >60 mL/min (ref >60)
GFR calc non Af Amer: >60 mL/min (ref >60)

## 2019-06-16 LAB — SARS CORONAVIRUS 2 (TAT 6-24 HRS): SARS Coronavirus 2: NEGATIVE

## 2019-06-16 LAB — AMMONIA: Ammonia: 21 umol/L (ref 9–35)

## 2019-06-16 MED ORDER — LORAZEPAM 2 MG/ML IJ SOLN
1.0000 mg | Freq: Once | INTRAMUSCULAR | Status: AC
  Administered 2019-06-17: 11:00:00 1 mg via INTRAVENOUS
  Filled 2019-06-16: qty 1

## 2019-06-16 MED ORDER — RESOURCE THICKENUP CLEAR PO POWD
ORAL | Status: DC | PRN
  Filled 2019-06-16: qty 125

## 2019-06-16 MED ORDER — INSULIN ASPART 100 UNIT/ML ~~LOC~~ SOLN
0.0000 [IU] | Freq: Three times a day (TID) | SUBCUTANEOUS | Status: DC
  Administered 2019-06-16: 13:00:00 5 [IU] via SUBCUTANEOUS
  Administered 2019-06-16: 17:00:00 2 [IU] via SUBCUTANEOUS
  Administered 2019-06-17: 1 [IU] via SUBCUTANEOUS
  Administered 2019-06-17: 2 [IU] via SUBCUTANEOUS
  Administered 2019-06-17: 5 [IU] via SUBCUTANEOUS
  Administered 2019-06-18: 3 [IU] via SUBCUTANEOUS
  Administered 2019-06-18 (×2): 2 [IU] via SUBCUTANEOUS
  Administered 2019-06-19: 3 [IU] via SUBCUTANEOUS
  Administered 2019-06-19: 17:00:00 5 [IU] via SUBCUTANEOUS
  Administered 2019-06-19: 07:00:00 3 [IU] via SUBCUTANEOUS
  Administered 2019-06-20 (×2): 5 [IU] via SUBCUTANEOUS
  Administered 2019-06-20: 17:00:00 1 [IU] via SUBCUTANEOUS
  Administered 2019-06-21: 07:00:00 7 [IU] via SUBCUTANEOUS
  Administered 2019-06-21 (×2): 3 [IU] via SUBCUTANEOUS

## 2019-06-16 NOTE — Progress Notes (Signed)
Occupational Therapy Treatment Patient Details Name: Douglas [name]. MRN: [id number] DOB: Dec 24, 1950 Today's Date: 06/16/2019    History of present illness 69 y.o. male presenting with AMS. PMHx is significant for HTN, DM, h/o CVA with right-sided weakness. Of note pt with recent admit and d/c home on 2/7 for AMS. Head CT on 2/13 was negative for acute intracranial process.  MRI is pending.     OT comments  Assisted with transition back to bed during this session as pt yelling out into hallway for assist. Use of Stedy for functional transfers with pt continuing to require two person assist for sit<>stand (maxA+2). Pt repositioned for comfort in bed and to optimize midline as pt with tendency to list L. Recommend continue per POC.    Follow Up Recommendations  Home health OT;Supervision/Assistance - 24 hour(pending family able to provide necessary assist)    Equipment Recommendations  Hospital bed;Other (comment)(hoyer if family does not already have)          Precautions / Restrictions Precautions Precautions: Fall Precaution Comments: high tone in RUE and RLE Restrictions Weight Bearing Restrictions: No       Mobility Bed Mobility Overal bed mobility: Needs Assistance Bed Mobility: Sit to Supine     Supine to sit: Max assist;+2 for physical assistance;+2 for safety/equipment Sit to supine: Max assist;+2 for physical assistance   General bed mobility comments: Assist to bring LEs into bed, lower trunk and scoot up in bed with pt pushing through LLE to assist.  Transfers Overall transfer level: Needs assistance Equipment used: Ambulation equipment used Transfers: Sit to/from Stand Sit to Stand: Max assist;+2 physical assistance;+2 safety/equipment;Mod assist Stand pivot transfers: Total assist;+2 physical assistance Squat pivot transfers: Max assist;+2 physical assistance;+2 safety/equipment     General transfer comment: Max A of 2  to stand from recliner using  stedy, stood from stedy flaps with Mod A of 2 and cues for technique.    Balance Overall balance assessment: Needs assistance Sitting-balance support: Feet supported;Single extremity supported Sitting balance-Leahy Scale: Poor Sitting balance - Comments: Pt leaning left in chair with difficulty maintaining midline and upright likely due to fatigue. CLose min guard-supevision. Postural control: Left lateral lean Standing balance support: During functional activity Standing balance-Leahy Scale: Zero Standing balance comment: Use of stedy to stand.                           ADL either performed or assessed with clinical judgement   ADL Overall ADL's : Needs assistance/impaired Eating/Feeding: Minimal assistance;Set up;Sitting Eating/Feeding Details (indicate cue type and reason): assist to stabilize cup of applesauce while pt using L hand to scoop  Grooming: Wash/dry face;Wash/dry hands;Minimal assistance;Set up;Sitting Grooming Details (indicate cue type and reason): pt able to wash face with setup assist, some assist to wash L hand thoroughly as pt unable to assist with RUE Upper Body Bathing: Moderate assistance;Sitting   Lower Body Bathing: Maximal assistance;Total assistance;+2 for physical assistance;+2 for safety/equipment;Sitting/lateral leans;Sit to/from stand   Upper Body Dressing : Moderate assistance;Sitting   Lower Body Dressing: Maximal assistance;Total assistance;+2 for physical assistance;+2 for safety/equipment;Sitting/lateral leans;Sit to/from stand   Toilet Transfer: Maximal assistance;+2 for physical assistance;+2 for safety/equipment;Squat-pivot Toilet Transfer Details (indicate cue type and reason): simulated via transfer to recliner Toileting- Clothing Manipulation and Hygiene: Total assistance;+2 for physical assistance;+2 for safety/equipment;Sitting/lateral lean;Sit to/from stand       Functional mobility during ADLs: Maximal assistance;+2 for  physical assistance;+2 for safety/equipment(via Stedy)  General ADL Comments: assisted with transfer back to bed, pt yelling out into hallway for period of time prior to assisting                       Cognition Arousal/Alertness: Awake/alert Behavior During Therapy: Flat affect Overall Cognitive Status: Difficult to assess                                 General Comments: Pt yelling out into hallway, "heyyy heyyyy." Pt ultimately wanting to get back into bed.        Exercises     Shoulder Instructions       General Comments RN in room end of session to assess IV site    Pertinent Vitals/ Pain       Pain Assessment: Faces Faces Pain Scale: Hurts little more Pain Location: pointing to left wrist where IV site is Pain Descriptors / Indicators: Grimacing Pain Intervention(s): Limited activity within patient's tolerance;Monitored during session;Other (comment)(RN notified)  Home Living Family/patient expects to be discharged to:: Private residence Living Arrangements: Children(daughter) Available Help at Discharge: Family;Available 24 hours/day;Other (Comment);Personal care attendant Type of Home: Apartment Home Access: Level entry     Home Layout: One level               Home Equipment: [place] - 2 wheels;Bedside commode;Cane - single point;Shower seat;Wheelchair - manual;Hospital bed;Other (comment)          Prior Functioning/Environment Level of Independence: Needs assistance  Gait / Transfers Assistance Needed: requires assist to transfer to [facility]. ADL's / Homemaking Assistance Needed: Requires assist from PCA for ADLs, completing bed baths   Comments: daughter providing some information via phone   Frequency  Min 2X/week        Progress Toward Goals  OT Goals(current goals can now be found in the care plan section)  Progress towards OT goals: Progressing toward goals  Acute Rehab OT Goals Patient Stated Goal: per daughter, to  return home with home health OT Goal Formulation: With family Time For Goal Achievement: 06/30/19 Potential to Achieve Goals: Fair ADL Goals Pt Will Perform Eating: with set-up;sitting;with adaptive utensils Pt Will Perform Grooming: with set-up;with supervision;sitting Pt Will Perform Upper Body Dressing: with min assist;sitting Pt Will Transfer to Toilet: with mod assist;stand pivot transfer;squat pivot transfer;bedside commode  Plan Discharge plan remains appropriate    Co-evaluation    PT/OT/SLP Co-Evaluation/Treatment: Yes Reason for Co-Treatment: Necessary to address cognition/behavior during functional activity;To address functional/ADL transfers;For patient/therapist safety   OT goals addressed during session: ADL's and self-care      AM-PAC OT "6 Clicks" Daily Activity     Outcome Measure   Help from another person eating meals?: A Lot Help from another person taking care of personal grooming?: A Lot Help from another person toileting, which includes using toliet, bedpan, or urinal?: Total Help from another person bathing (including washing, rinsing, drying)?: A Lot Help from another person to put on and taking off regular upper body clothing?: A Lot Help from another person to put on and taking off regular lower body clothing?: Total 6 Click Score: 10    End of Session Equipment Utilized During Treatment: Gait belt  OT Visit Diagnosis: Unsteadiness on feet (R26.81);Other abnormalities of gait and mobility (R26.89);Other symptoms and signs involving cognitive function;Cognitive communication deficit (R41.841);Hemiplegia and hemiparesis Symptoms and signs involving cognitive functions: Cerebral infarction(old) Hemiplegia - Right/Left: Right  Hemiplegia - dominant/non-dominant: Dominant Hemiplegia - caused by: Cerebral infarction(old)   Activity Tolerance Patient tolerated treatment well   Patient Left in bed;with call bell/phone within reach;with bed alarm set;with  nursing/sitter in room   Nurse Communication Mobility status        Time: 1287-8676 OT Time Calculation (min): 16 min  Charges: OT General Charges $OT Visit: 1 Visit OT Evaluation $OT Eval Moderate Complexity: 1 Mod   Marcy Siren, OT [employer] Pager [phone] Office [phone]    Orlando Penner 06/16/2019, 12:29 PM

## 2019-06-16 NOTE — Progress Notes (Signed)
Physical Therapy Treatment Patient Details Name: Douglas [name]. MRN: [id number] DOB: [birth year] Today's Date: 06/16/2019    History of Present Illness 69 y.o. male presenting with AMS. PMHx is significant for HTN, DM, h/o CVA with right-sided weakness. Of note pt with recent admit and d/c home on 2/7 for AMS. Head CT on 2/13 was negative for acute intracranial process.  MRI is pending.      PT Comments    Patient yelling out from hallway. Speech not intelligible however found out pt wanting to get back to bed. Nurse and tech not available. Requires Max A of 2 to stand from recliner using stedy. Able to stand from stedy flaps with Mod A of 2 and use of LUE. Total A to transfer via stedy. Pt appears frustrated not being able to communicate.  Continue to recommend HHPT if daughter able to care for pt at this level at home. If not, may consider SNF. Will follow.   Follow Up Recommendations  Home health PT;Supervision for mobility/OOB;Supervision/Assistance - 24 hour     Equipment Recommendations  None recommended by PT    Recommendations for Other Services       Precautions / Restrictions Precautions Precautions: Fall Precaution Comments: high tone in RUE and RLE Restrictions Weight Bearing Restrictions: No    Mobility  Bed Mobility Overal bed mobility: Needs Assistance Bed Mobility: Sit to Supine     Supine to sit: Max assist;+2 for physical assistance;+2 for safety/equipment Sit to supine: Max assist;+2 for physical assistance   General bed mobility comments: Assist to bring LEs into bed, lower trunk and scoot up in bed with pt pushing through LLE to assist.  Transfers Overall transfer level: Needs assistance Equipment used: Ambulation equipment used Transfers: Sit to/from Stand Sit to Stand: Max assist;+2 physical assistance;+2 safety/equipment;Mod assist Stand pivot transfers: Total assist;+2 physical assistance Squat pivot transfers: Max assist;+2 physical  assistance;+2 safety/equipment     General transfer comment: Max A of 2  to stand from recliner using stedy, stood from stedy flaps with Mod A of 2 and cues for technique.  Ambulation/Gait             General Gait Details: unable   Stairs             Wheelchair Mobility    Modified Rankin (Stroke Patients Only) Modified Rankin (Stroke Patients Only) Pre-Morbid Rankin Score: Severe disability Modified Rankin: Severe disability     Balance Overall balance assessment: Needs assistance Sitting-balance support: Feet supported;Single extremity supported Sitting balance-Leahy Scale: Poor Sitting balance - Comments: Pt leaning left in chair with difficulty maintaining midline and upright likely due to fatigue. CLose min guard-supevision. Postural control: Left lateral lean Standing balance support: During functional activity Standing balance-Leahy Scale: Zero Standing balance comment: Use of stedy to stand.                            Cognition Arousal/Alertness: Awake/alert Behavior During Therapy: Flat affect Overall Cognitive Status: Difficult to assess                                 General Comments: Pt yelling out into hallway, "heyyy heyyyy." Pt ultimately wanting to get back into bed.      Exercises      General Comments        Pertinent Vitals/Pain Pain Assessment: Faces Faces Pain Scale: Hurts  little more Pain Location: pointing to left wrist where IV site is Pain Descriptors / Indicators: Grimacing Pain Intervention(s): Monitored during session;Other (comment)(notified RN)    Home Living Family/patient expects to be discharged to:: Private residence Living Arrangements: Children(daughter) Available Help at Discharge: Family;Available 24 hours/day;Other (Comment);Personal care attendant Type of Home: Apartment Home Access: Level entry   Home Layout: One level Home Equipment: [place] - 2 wheels;Bedside commode;Cane -  single point;Shower seat;Wheelchair - manual;Hospital bed;Other (comment)      Prior Function Level of Independence: Needs assistance  Gait / Transfers Assistance Needed: requires assist to transfer to [facility]. ADL's / Homemaking Assistance Needed: Requires assist from PCA for ADLs, completing bed baths Comments: daughter providing some information via phone   PT Goals (current goals can now be found in the care plan section) Acute Rehab PT Goals Patient Stated Goal: per daughter, to return home with home health PT Goal Formulation: Patient unable to participate in goal setting Time For Goal Achievement: 06/30/19 Potential to Achieve Goals: Fair Progress towards PT goals: Progressing toward goals    Frequency    Min 3X/week      PT Plan Current plan remains appropriate    Co-evaluation PT/OT/SLP Co-Evaluation/Treatment: Yes Reason for Co-Treatment: Complexity of the patient's impairments (multi-system involvement);For patient/therapist safety;To address functional/ADL transfers PT goals addressed during session: Mobility/safety with mobility OT goals addressed during session: ADL's and self-care      AM-PAC PT "6 Clicks" Mobility   Outcome Measure  Help needed turning from your back to your side while in a flat bed without using bedrails?: Total Help needed moving from lying on your back to sitting on the side of a flat bed without using bedrails?: Total Help needed moving to and from a bed to a chair (including a wheelchair)?: Total Help needed standing up from a chair using your arms (e.g., wheelchair or bedside chair)?: Total Help needed to walk in hospital room?: Total Help needed climbing 3-5 steps with a railing? : Total 6 Click Score: 6    End of Session Equipment Utilized During Treatment: Gait belt Activity Tolerance: Patient tolerated treatment well Patient left: in bed;with call bell/phone within reach;with bed alarm set Nurse Communication: Mobility status;Need  for lift equipment;Other (comment)(IV) PT Visit Diagnosis: Other abnormalities of gait and mobility (R26.89);Unsteadiness on feet (R26.81);Muscle weakness (generalized) (M62.81);Other symptoms and signs involving the nervous system (R29.898);Hemiplegia and hemiparesis Hemiplegia - Right/Left: Right Hemiplegia - dominant/non-dominant: Dominant     Time: 3664-4034 PT Time Calculation (min) (ACUTE ONLY): 17 min  Charges:  $Therapeutic Activity: 8-22 mins                     Marisa Severin, PT, DPT Acute Rehabilitation Services Pager [phone] Office [place] 06/16/2019, 12:11 PM

## 2019-06-16 NOTE — Evaluation (Signed)
Clinical/Bedside Swallow Evaluation Patient Details  Name: Nahum Sherrer. MRN: [id number] Date of Birth: 11-25-1950  Today's Date: 06/16/2019 Time: SLP Start Time (ACUTE ONLY): 0750 SLP Stop Time (ACUTE ONLY): 0810 SLP Time Calculation (min) (ACUTE ONLY): 20 min  Past Medical History:  Past Medical History:  Diagnosis Date  . Diabetes mellitus without complication (HCC)   . Hypertension   . Stroke [facility])    Past Surgical History:  Past Surgical History:  Procedure Laterality Date  . IR PATIENT EVAL TECH 0-60 MINS  03/30/2018  . LOOP RECORDER INSERTION N/A 03/08/2019   Procedure: LOOP RECORDER INSERTION;  Surgeon: Regan Lemming, MD;  Location: [facility] INVASIVE CV LAB;  Service: Cardiovascular;  Laterality: N/A;  . TEE WITHOUT CARDIOVERSION N/A 04/02/2018   Procedure: TRANSESOPHAGEAL ECHOCARDIOGRAM (TEE);  Surgeon: [name], MD;  Location: [facility] ENDOSCOPY;  Service: Cardiovascular;  Laterality: N/A;   HPI:  [name]. is a 69 y.o. male presenting with AMS. PMH is significant for AMS, h/o CVA with right-sided weakness, HTN. Pt is know to ST department and the most recent BSE on 06/06/19 recommended Dysphagia 3 solids and thin liquids (no straws).  Head CT on 2/13 was negative for acute intracranial process.  MRI is pending.    Assessment / Plan / Recommendation Clinical Impression  Pt was seen for a bedside swallow evaluation and he presents with suspected oropharyngeal dysphagia.  He was encountered awake/alert and was agreeable to ST evaluation.  Oral mechanism exam was remarkable for a R facial droop, likely secondary to a previous CVA.  Pt consumed trials of ice chips, thin liquid, nectar-thick liquid, puree, and regular solids.  Pt was observed to be impulsive throughout this evaluation evidenced by taking multiple large sips of his drink in a row.  He exhibited prolonged mastication with ice chip and regular solids trials and AP transport was additionally prolonged  with solid trials.  Trace oral residue was observed on the pt's lingual surface following consumption of regular solids and it cleared with a cued liquid wash.  Pt exhibited a consistent, immediate cough following all thin liquid trials despite taking small sips.  No clinical s/sx of aspiration were observed with nectar-thick liquids, ice chips, puree, or regular solids.  Recommend initiation of Dysphagia 1 (puree) solids and nectar-thick liquids with intermittent supervision to cue for compensatory strategies (listed below).  Pt would benefit from an instrumental swallow study to further evaluate swallow function.  SLP will f/u per POC.   SLP Visit Diagnosis: Dysphagia, oropharyngeal phase (R13.12)    Aspiration Risk  Mild aspiration risk    Diet Recommendation Dysphagia 3 (Mech soft);Nectar-thick liquid   Liquid Administration via: Cup;Straw Medication Administration: Whole meds with puree Supervision: Intermittent supervision to cue for compensatory strategies Compensations: Minimize environmental distractions;Slow rate;Small sips/bites;Other (Comment) Postural Changes: Seated upright at 90 degrees    Other  Recommendations Oral Care Recommendations: Oral care BID;Staff/trained caregiver to provide oral care Other Recommendations: Order thickener from pharmacy   Follow up Recommendations Other (comment)(TBD)      Frequency and Duration min 2x/week  2 weeks       Prognosis Prognosis for Safe Diet Advancement: Fair Barriers to Reach Goals: Cognitive deficits;Language deficits;Behavior      Swallow Study   General Date of Onset: 06/15/19 HPI: Estaban [place]. is a 69 y.o. male presenting with AMS. PMH is significant for AMS, h/o CVA with right-sided weakness, HTN. Pt is know to ST department and the most recent  BSE on 06/06/19 recommended Dysphagia 3 solids and thin liquids (no straws).  Head CT on 2/13 was negative for acute intracranial process.  MRI is pending.  Type of Study:  Bedside Swallow Evaluation Previous Swallow Assessment: See HPI Diet Prior to this Study: NPO Temperature Spikes Noted: No Respiratory Status: Room air History of Recent Intubation: No Behavior/Cognition: Alert;Impulsive;Distractible Oral Cavity Assessment: Within Functional Limits Oral Care Completed by SLP: No Oral Cavity - Dentition: Missing dentition;Adequate natural dentition Vision: Functional for self-feeding Self-Feeding Abilities: Able to feed self;Needs set up Patient Positioning: Upright in bed Baseline Vocal Quality: Normal    Oral/Motor/Sensory Function Overall Oral Motor/Sensory Function: Mild impairment Facial ROM: Reduced right Facial Symmetry: Abnormal symmetry right Lingual Symmetry: Other (Comment)(Pt did not protrude tongue on command)   Ice Chips Ice chips: Impaired Oral Phase Impairments: Impaired mastication Oral Phase Functional Implications: Prolonged oral transit Pharyngeal Phase Impairments: Suspected delayed Swallow   Thin Liquid Thin Liquid: Impaired Presentation: Spoon;Cup Oral Phase Functional Implications: Prolonged oral transit Pharyngeal  Phase Impairments: Suspected delayed Swallow;Cough - Immediate    Nectar Thick Nectar Thick Liquid: Within functional limits Presentation: Cup;Straw   Honey Thick Honey Thick Liquid: Not tested   Puree Puree: Within functional limits Presentation: Spoon;Self Fed   Solid     Solid: Impaired Presentation: Self Fed Oral Phase Impairments: Impaired mastication;Reduced lingual movement/coordination Oral Phase Functional Implications: Impaired mastication;Prolonged oral transit     Colin Mulders M.S., CCC-SLP Acute Rehabilitation Services Office: [phone]  [place] 06/16/2019,8:34 AM

## 2019-06-16 NOTE — Evaluation (Signed)
Occupational Therapy Evaluation Patient Details Name: Douglas [name]. MRN: [id number] DOB: 1950/10/25 Today's Date: 06/16/2019    History of Present Illness 69 y.o. male presenting with AMS. PMHx is significant for HTN, DM, h/o CVA with right-sided weakness. Of note pt with recent admit and d/c home on 2/7 for AMS. Head CT on 2/13 was negative for acute intracranial process.  MRI is pending.     Clinical Impression   This 69 y/o male presents with the above. Pt with recent admit and discharge home, now presenting with AMS. Daughter calling pt during session and providing some home setup and PLOF. PTA pt was able to stand and transfer to wheelchair with assistance, was receiving assist for ADL tasks. Pt currently requiring two person assist for safe completion of bed mobility and squat pivot transfer to recliner. He currently requires minA for self-feeding, modA for UB ADL and max-totalA (+2) for LB ADL. Pt with baseline communication difficulties but overall following majority of simple commands given intermittent cues to do so. He will benefit from continued acute OT services, pending level of assist family is able to provide recommend pt return home with additional [place] services. If family/caregivers unable to provide necessary assist may need to consider SNF. Will follow.     Follow Up Recommendations  Home health OT;Supervision/Assistance - 24 hour(pending family able to provide necessary assist)    Equipment Recommendations  Hospital bed;Other (comment)(hoyer (if family does not already have))           Precautions / Restrictions Precautions Precautions: Fall Precaution Comments: high tone in RUE and RLE Restrictions Weight Bearing Restrictions: No      Mobility Bed Mobility Overal bed mobility: Needs Assistance Bed Mobility: Supine to Sit     Supine to sit: Max assist;+2 for physical assistance;+2 for safety/equipment     General bed mobility comments: assist for LEs  over EOB and trunk elevation, cues for use of LUE to self assist  Transfers Overall transfer level: Needs assistance Equipment used: 2 person hand held assist Transfers: Sit to/from [employer] Sit to Stand: Max assist;+2 physical assistance;+2 safety/equipment   Squat pivot transfers: Max assist;+2 physical assistance;+2 safety/equipment     General transfer comment: initially performed sit<>Stand from EOB, pt with difficulty maintianing full upright posture upon standing, returned to sitting and performed squat pivot to recliner via +2 face to face     Balance Overall balance assessment: Needs assistance Sitting-balance support: Single extremity supported;Feet supported Sitting balance-Leahy Scale: Fair Sitting balance - Comments: pt seeking LUE support for increased stability   Standing balance support: Single extremity supported Standing balance-Leahy Scale: Zero Standing balance comment: +2 maxA for balance                           ADL either performed or assessed with clinical judgement   ADL Overall ADL's : Needs assistance/impaired Eating/Feeding: Minimal assistance;Set up;Sitting Eating/Feeding Details (indicate cue type and reason): assist to stabilize cup of applesauce while pt using L hand to scoop  Grooming: Wash/dry face;Wash/dry hands;Minimal assistance;Set up;Sitting Grooming Details (indicate cue type and reason): pt able to wash face with setup assist, some assist to wash L hand thoroughly as pt unable to assist with RUE Upper Body Bathing: Moderate assistance;Sitting   Lower Body Bathing: Maximal assistance;Total assistance;+2 for physical assistance;+2 for safety/equipment;Sitting/lateral leans;Sit to/from stand   Upper Body Dressing : Moderate assistance;Sitting   Lower Body Dressing: Maximal assistance;Total assistance;+2 for  physical assistance;+2 for safety/equipment;Sitting/lateral leans;Sit to/from stand   Toilet Transfer:  Maximal assistance;+2 for physical assistance;+2 for safety/equipment;Squat-pivot Toilet Transfer Details (indicate cue type and reason): simulated via transfer to recliner Toileting- Clothing Manipulation and Hygiene: Total assistance;+2 for physical assistance;+2 for safety/equipment;Sitting/lateral lean;Sit to/from stand       Functional mobility during ADLs: Maximal assistance;+2 for physical assistance;+2 for safety/equipment(squat pivot transfer) General ADL Comments: pt with chronic R side weakness, impaired communication/cognition, decreased sitting/standing balance                         Pertinent Vitals/Pain Pain Assessment: Faces Faces Pain Scale: No hurt Pain Intervention(s): Monitored during session     Hand Dominance Right   Extremity/Trunk Assessment Upper Extremity Assessment Upper Extremity Assessment: RUE deficits/detail RUE Deficits / Details: flexor tone with tightness throughout; no AROM noted, PROM limited  RUE Coordination: decreased fine motor;decreased gross motor   Lower Extremity Assessment Lower Extremity Assessment: Defer to PT evaluation       Communication Communication Communication: Expressive difficulties;Receptive difficulties   Cognition Arousal/Alertness: Awake/alert Behavior During Therapy: Flat affect Overall Cognitive Status: Difficult to assess                                 General Comments: follows approx 75% of one step simple commands, intermittently attempting to communicate with therapists but unintelligble speech   General Comments       Exercises     Shoulder Instructions      Home Living Family/patient expects to be discharged to:: Private residence Living Arrangements: Children Available Help at Discharge: Family;Available 24 hours/day;Other (Comment);Personal care attendant Type of Home: Apartment Home Access: Level entry     Home Layout: One level               Home Equipment:  Walker - 2 wheels;Bedside commode;Cane - single point;Shower seat;Wheelchair - manual;Hospital bed;Other (comment)          Prior Functioning/Environment Level of Independence: Needs assistance  Gait / Transfers Assistance Needed: requires assist to transfer to [facility] ADL's / Homemaking Assistance Needed: Requires assist from PCA for ADLs, completing bed baths   Comments: daughter providing some information via phone        OT Problem List: Decreased strength;Decreased range of motion;Impaired balance (sitting and/or standing);Impaired UE functional use;Decreased cognition;Decreased coordination;Decreased safety awareness;Impaired tone;Decreased activity tolerance      OT Treatment/Interventions: Self-care/ADL training;DME and/or AE instruction;Patient/family education;Balance training;Therapeutic activities;Therapeutic exercise;Neuromuscular education;Cognitive remediation/compensation    OT Goals(Current goals can be found in the care plan section) Acute Rehab OT Goals Patient Stated Goal: per daughter, to return home with home health OT Goal Formulation: With family Time For Goal Achievement: 06/30/19 Potential to Achieve Goals: Fair  OT Frequency: Min 2X/week   Barriers to D/C:            Co-evaluation PT/OT/SLP Co-Evaluation/Treatment: Yes Reason for Co-Treatment: Complexity of the patient's impairments (multi-system involvement);For patient/therapist safety;To address functional/ADL transfers   OT goals addressed during session: ADL's and self-care      AM-PAC OT "6 Clicks" Daily Activity     Outcome Measure Help from another person eating meals?: A Lot Help from another person taking care of personal grooming?: A Lot Help from another person toileting, which includes using toliet, bedpan, or urinal?: Total Help from another person bathing (including washing, rinsing, drying)?: A Lot Help from another person to put  on and taking off regular upper body clothing?: A  Lot Help from another person to put on and taking off regular lower body clothing?: Total 6 Click Score: 10   End of Session Equipment Utilized During Treatment: Gait belt Nurse Communication: Mobility status  Activity Tolerance: Patient tolerated treatment well Patient left: in chair;with call bell/phone within reach;with chair alarm set  OT Visit Diagnosis: Unsteadiness on feet (R26.81);Other abnormalities of gait and mobility (R26.89);Other symptoms and signs involving cognitive function;Cognitive communication deficit (R41.841);Hemiplegia and hemiparesis Symptoms and signs involving cognitive functions: Cerebral infarction(old) Hemiplegia - Right/Left: Right Hemiplegia - dominant/non-dominant: Dominant Hemiplegia - caused by: Cerebral infarction(old)                Time: 9702-6378 OT Time Calculation (min): 28 min Charges:  OT General Charges $OT Visit: 1 Visit OT Evaluation $OT Eval Moderate Complexity: 1 Mod  Marcy Siren, OT [employer] Pager [phone] Office [phone]   Orlando Penner 06/16/2019, 9:03 AM

## 2019-06-16 NOTE — Progress Notes (Signed)
Family Medicine Teaching Service Daily Progress Note Intern Pager: [phone]  Patient name: Douglas [name]. Medical record number: [id number] Date of birth: 04/09/1951 Age: 69 y.o. Gender: male  Primary Care Provider: Jilda Panda, MD Consultants: none Code Status: full  Pt Overview and Major Events to Date:  2/13 admitted for AMS  Assessment and Plan: Douglas [name]. is a 69 y.o. male presenting with AMS today . PMH is significant for AMS, h/o CVA with right-sided weakness, HTN  AMS- appears to be at baseline per description from patient's daughter. He does seem like he is trying to express something but unable to verbalize.  - EEG - brain MRI to assess for CVA - f/u ionized calcium - PT/OT - maintenance IV fluids - re-attempt bedside swallow study - continue home calcium supplement  Possible leg pain- foot xray shows no acute bony abnormalities. Questionable soft tissue disruption at nail bed. On exam, no acute skin changes that would suggest infection. - tylenol PRN - recommend podiatry follow up for nail care  H/o CVA with residual right-sided hemiplegia- unchanged - PT/OT - continue asa, plavix - f/u MRI brain  HTN- hypertensive without medications. Did not get swallow study last night. Will need to attempt again today to administer his meds. 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 hgb A1c 6.7 this month. - CBG this am - hold home med. Consider sSSI if elevated BS  BPH with frequent UTIs. Urinalysis negative on presentation. Condom cath in place - continue tamsulosin - strict I/O  FEN/GI: NPO until swallow study and then dysphagia 3 diet per speech recommendations at recent admission Prophylaxis: lovenox  Disposition: likely discharge to SNF  Subjective:  Patient alert and able to follow commands and communicate somewhat. I  believe he is at baseline. Would be helpful to have daughter visit today and get her opinion.   Objective: Temp:  [98 F (36.7 C)-98.8 F (37.1 C)] 98.2 F (36.8 C) (02/14 0341) Pulse Rate:  [84-96] 96 (02/14 0341) Resp:  [9-23] 17 (02/14 0341) BP: (150-188)/(83-110) 169/89 (02/14 0341) SpO2:  [98 %-100 %] 100 % (02/14 0341) Weight:  [88.3 kg] 88.3 kg (02/13 2350) Physical Exam: General: alert, NAD Cardiovascular: RRR, no murmur Respiratory: CTAB Abdomen: soft, non-tender Extremities: severe onychomycosis. Negative for edema, erythema, deformities  Laboratory: Recent Labs  Lab 06/15/19 1945 06/16/19 0110  WBC 5.4 6.5  HGB 14.6 14.1  HCT 46.2 43.9  PLT 233 226   Recent Labs  Lab 06/15/19 1945 06/16/19 0110  NA 144  --   K 4.8  --   CL 110  --   CO2 23  --   BUN 13  --   CREATININE 0.85 0.91  CALCIUM 8.5*  --   PROT 6.6  --   BILITOT 0.7  --   ALKPHOS 77  --   ALT 23  --   AST 24  --   GLUCOSE 150*  --    Ionized calcium pending COVID negative  Imaging/Diagnostic Tests: CT Head Wo Contrast  Result Date: 06/15/2019 CLINICAL DATA:  Altered level of consciousness, right-sided weakness and speech deficits EXAM: CT HEAD WITHOUT CONTRAST TECHNIQUE: Contiguous axial images were obtained from the base of the skull through the vertex without intravenous contrast. COMPARISON:  06/05/2019 FINDINGS: Brain: Chronic left cerebral encephalomalacia consistent with previous infarct. No signs of acute infarct or hemorrhage. There  is ex vacuo dilatation of the lateral ventricle, stable. Midline structures are unremarkable. No acute extra-axial fluid collections. No mass effect. Vascular: No hyperdense vessel or unexpected calcification. Skull: Normal. Negative for fracture or focal lesion. Sinuses/Orbits: No acute finding. Other: None IMPRESSION: 1. Chronic encephalomalacia related to previous left PCA and MCA territory infarcts. 2. No acute intracranial process. Electronically Signed    By: Douglas [name] M.D.   On: 06/15/2019 19:05   DG Foot 2 Views Right  Result Date: 06/15/2019 CLINICAL DATA:  Pain EXAM: RIGHT FOOT - 2 VIEW COMPARISON:  None. FINDINGS: The osseous structures appear diffusely demineralized which may limit detection of small or nondisplaced fractures. No acute bony abnormality. Specifically, no fracture, subluxation, or dislocation. Irregularity of the nail bed of the first digit with questionable soft tissue disruption. No soft tissue gas or foreign body is seen. Clawtoe deformities of the second through fifth digits. Corticated accessory os peroneum is noted. Apparent pes planus deformity though incompletely assessed on nonweightbearing films. IMPRESSION: 1. No acute bony abnormality. 2. Irregularity of the nail bed of the first digit with questionable soft tissue disruption, correlate with visual inspection. 3. Clawtoe deformities of the second through fifth digits. 4. Apparent pes planus, incompletely assessed on nonweightbearing views. Electronically Signed   By: Douglas [name] M.D.   On: 06/15/2019 22:37   Brain MRI and EEG pending  Douglas Bock, DO 06/16/2019, 7:30 AM PGY-2, [facility] Family Medicine FPTS Intern pager: [phone], text pages welcome

## 2019-06-16 NOTE — Procedures (Addendum)
Patient Name: Douglas [name].  MRN: [id number]  Epilepsy Attending: Charlsie Quest  Referring Physician/Provider:  Dr Karsten Fells Date: 06/16/2019 Duration: 23.27 mins  Patient history: 69 y.o.malewith h/o CVA with right-sided weakness presenting with AMS. EEG to evaluate for seizure.  Level of alertness: awake, asleep  AEDs during EEG study: LTG  Technical aspects: This EEG study was done with scalp electrodes positioned according to the 10-20 International system of electrode placement. Electrical activity was acquired at a sampling rate of 500Hz  and reviewed with a high frequency filter of 70Hz  and a low frequency filter of 1Hz . EEG data were recorded continuously and digitally stored.   DESCRIPTION: The posterior dominant rhythm consists of 9-10 Hz activity of moderate voltage (25-35 uV) seen predominantly in posterior head regions, symmetric and reactive to eye opening and eye closing. Sleep was characterized by vertex waves, sleep spindles (12-14hz ), maximal frontocentral. EEG showed continuous generalized 3-5hz  theta-delta slowing in left frontotemporal region as well as sharp waves in left fronto-temporal region ( maximal F7, FP1).  Hyperventilation and photic stimulation were not performed.  ABNORMALITY - Sharp wave, left frontotemporal - Cotinuous slow,  left frontotemporal  IMPRESSION: This study showed evidence of epileptogenicity as well as cortical dysfunction in left frontotemporal likely secondary to underlying infarct.  No seizures were seen throughout the recording.     Jerimah Witucki 

## 2019-06-16 NOTE — Progress Notes (Signed)
EEG complete - results pending 

## 2019-06-16 NOTE — Evaluation (Signed)
Physical Therapy Evaluation Patient Details Name: Douglas [name]. MRN: [id number] DOB: Jun 30, 1950 Today's Date: 06/16/2019   History of Present Illness  69 y.o. male presenting with AMS. PMHx is significant for HTN, DM, h/o CVA with right-sided weakness. Of note pt with recent admit and d/c home on 2/7 for AMS. Head CT on 2/13 was negative for acute intracranial process.  MRI is pending.    Clinical Impression  Patient presents with residual right sided deficits (hemiparetic/increased tone) and speech deficits from prior CVA, impaired balance and impaired mobility s/p above. Per daughter, pt has assist with ADLs from an aide and gets assist to transfer to a w/c at baseline. Today, pt requires Max A of 2 for bed mobility and squat pivot transfer to chair. Able to stand half way up with assist of 2. Pt with receptive/expressive speech difficulties. Follows 75% of commands. Will follow acutely to maximize independence and mobility prior to return home. Daughter wants pt to return home with HHPT services. Recommend use of stedy for transfers.      Follow Up Recommendations Home health PT;Supervision for mobility/OOB;Supervision/Assistance - 24 hour    Equipment Recommendations  None recommended by PT    Recommendations for Other Services       Precautions / Restrictions Precautions Precautions: Fall Precaution Comments: high tone in RUE and RLE Restrictions Weight Bearing Restrictions: No      Mobility  Bed Mobility Overal bed mobility: Needs Assistance Bed Mobility: Supine to Sit     Supine to sit: Max assist;+2 for physical assistance;+2 for safety/equipment     General bed mobility comments: assist for LEs over EOB and trunk elevation, cues for use of LUE to self assist  Transfers Overall transfer level: Needs assistance Equipment used: 2 person hand held assist Transfers: Sit to/from [employer] Sit to Stand: Max assist;+2 physical assistance;+2  safety/equipment   Squat pivot transfers: Max assist;+2 physical assistance;+2 safety/equipment     General transfer comment: initially performed sit<>Stand from EOB, pt with difficulty maintianing full upright posture upon standing, returned to sitting and performed squat pivot to recliner via +2 face to face with multiple scoots using pad.  Ambulation/Gait             General Gait Details: unable  Stairs            Wheelchair Mobility    Modified Rankin (Stroke Patients Only) Modified Rankin (Stroke Patients Only) Pre-Morbid Rankin Score: Severe disability Modified Rankin: Severe disability     Balance Overall balance assessment: Needs assistance Sitting-balance support: Single extremity supported;Feet supported Sitting balance-Leahy Scale: Fair Sitting balance - Comments: pt seeking LUE support for increased stability   Standing balance support: During functional activity Standing balance-Leahy Scale: Zero Standing balance comment: +2 maxA for balance                             Pertinent Vitals/Pain Pain Assessment: Faces Faces Pain Scale: No hurt Pain Intervention(s): Monitored during session    Home Living Family/patient expects to be discharged to:: Private residence Living Arrangements: Children(daughter) Available Help at Discharge: Family;Available 24 hours/day;Other (Comment);Personal care attendant Type of Home: Apartment Home Access: Level entry     Home Layout: One level Home Equipment: [place] - 2 wheels;Bedside commode;Cane - single point;Shower seat;Wheelchair - manual;Hospital bed;Other (comment)      Prior Function Level of Independence: Needs assistance   Gait / Transfers Assistance Needed: requires assist to transfer  to WC.  ADL's / Homemaking Assistance Needed: Requires assist from PCA for ADLs, completing bed baths  Comments: daughter providing some information via phone     Hand Dominance   Dominant Hand:  Left    Extremity/Trunk Assessment   Upper Extremity Assessment Upper Extremity Assessment: Defer to OT evaluation RUE Deficits / Details: flexor tone with tightness throughout; no AROM noted, PROM limited  RUE Coordination: decreased fine motor;decreased gross motor    Lower Extremity Assessment Lower Extremity Assessment: RLE deficits/detail RLE Deficits / Details: increased tone with difficulty flexing hip and knee for EOB, no active movement throughout session       Communication   Communication: Expressive difficulties;Receptive difficulties  Cognition Arousal/Alertness: Awake/alert Behavior During Therapy: Flat affect Overall Cognitive Status: Difficult to assess                                 General Comments: follows approx 75% of one step simple commands, intermittently attempting to communicate with therapists but unintelligble speech      General Comments      Exercises     Assessment/Plan    PT Assessment Patient needs continued PT services  PT Problem List Decreased strength;Decreased mobility;Decreased safety awareness;Decreased range of motion;Decreased activity tolerance;Decreased balance;Decreased knowledge of use of DME;Decreased cognition       PT Treatment Interventions DME instruction;Therapeutic exercise;Wheelchair mobility training;Balance training;Functional mobility training;Cognitive remediation;Therapeutic activities;Patient/family education;Neuromuscular re-education    PT Goals (Current goals can be found in the Care Plan section)  Acute Rehab PT Goals Patient Stated Goal: per daughter, to return home with home health PT Goal Formulation: Patient unable to participate in goal setting Time For Goal Achievement: 06/30/19 Potential to Achieve Goals: Fair    Frequency Min 3X/week   Barriers to discharge        Co-evaluation PT/OT/SLP Co-Evaluation/Treatment: Yes Reason for Co-Treatment: Complexity of the patient's  impairments (multi-system involvement);For patient/therapist safety;To address functional/ADL transfers PT goals addressed during session: Mobility/safety with mobility OT goals addressed during session: ADL's and self-care       AM-PAC PT "6 Clicks" Mobility  Outcome Measure Help needed turning from your back to your side while in a flat bed without using bedrails?: Total Help needed moving from lying on your back to sitting on the side of a flat bed without using bedrails?: Total Help needed moving to and from a bed to a chair (including a wheelchair)?: Total Help needed standing up from a chair using your arms (e.g., wheelchair or bedside chair)?: Total Help needed to walk in hospital room?: Total Help needed climbing 3-5 steps with a railing? : Total 6 Click Score: 6    End of Session Equipment Utilized During Treatment: Gait belt Activity Tolerance: Patient tolerated treatment well Patient left: in chair;with call bell/phone within reach;with chair alarm set Nurse Communication: Mobility status;Need for lift equipment;Precautions(use of stedy) PT Visit Diagnosis: Other abnormalities of gait and mobility (R26.89);Unsteadiness on feet (R26.81);Muscle weakness (generalized) (M62.81);Other symptoms and signs involving the nervous system (R29.898);Hemiplegia and hemiparesis Hemiplegia - Right/Left: Right Hemiplegia - dominant/non-dominant: Dominant    Time: 1751-0258 PT Time Calculation (min) (ACUTE ONLY): 28 min   Charges:   PT Evaluation $PT Eval Moderate Complexity: 1 Mod          Vale Haven, PT, DPT Acute Rehabilitation Services Pager [phone] Office [phone]      Blake Divine A Saige [place] 06/16/2019, 12:01 PM

## 2019-06-17 ENCOUNTER — Inpatient Hospital Stay (HOSPITAL_COMMUNITY): Payer: Medicare Other

## 2019-06-17 ENCOUNTER — Ambulatory Visit (HOSPITAL_COMMUNITY): Payer: Medicare Other | Admitting: *Deleted

## 2019-06-17 DIAGNOSIS — R4182 Altered mental status, unspecified: Secondary | ICD-10-CM

## 2019-06-17 DIAGNOSIS — Z8673 Personal history of transient ischemic attack (TIA), and cerebral infarction without residual deficits: Secondary | ICD-10-CM

## 2019-06-17 DIAGNOSIS — N289 Disorder of kidney and ureter, unspecified: Secondary | ICD-10-CM

## 2019-06-17 DIAGNOSIS — I639 Cerebral infarction, unspecified: Secondary | ICD-10-CM

## 2019-06-17 LAB — CALCIUM, IONIZED: Calcium, Ionized, Serum: 5.3 mg/dL (ref 4.5–5.6)

## 2019-06-17 LAB — GLUCOSE, CAPILLARY
Glucose-Capillary: 182 mg/dL — ABNORMAL HIGH (ref 70–99)
Glucose-Capillary: 274 mg/dL — ABNORMAL HIGH (ref 70–99)
Glucose-Capillary: 138 mg/dL — ABNORMAL HIGH (ref 70–99)
Glucose-Capillary: 178 mg/dL — ABNORMAL HIGH (ref 70–99)

## 2019-06-17 LAB — BASIC METABOLIC PANEL
Anion gap: 9 (ref 5–15)
BUN: 17 mg/dL (ref 8–23)
CO2: 25 mmol/L (ref 22–32)
Calcium: 9.5 mg/dL (ref 8.9–10.3)
Chloride: 102 mmol/L (ref 98–111)
Creatinine, Ser: 1.12 mg/dL (ref 0.61–1.24)
GFR calc Af Amer: >60 mL/min (ref >60)
GFR calc non Af Amer: >60 mL/min (ref >60)
Glucose, Bld: 277 mg/dL — ABNORMAL HIGH (ref 70–99)
Potassium: 4 mmol/L (ref 3.5–5.1)
Sodium: 136 mmol/L (ref 135–145)

## 2019-06-17 LAB — CBC WITH DIFFERENTIAL/PLATELET
Abs Immature Granulocytes: 0.03 10*3/uL (ref 0.00–0.07)
Basophils Absolute: 0 10*3/uL (ref 0.0–0.1)
Basophils Relative: 1 %
Eosinophils Absolute: 0.2 10*3/uL (ref 0.0–0.5)
Eosinophils Relative: 3 %
HCT: 42.8 % (ref 39.0–52.0)
Hemoglobin: 13.9 g/dL (ref 13.0–17.0)
Immature Granulocytes: 1 %
Lymphocytes Relative: 24 %
Lymphs Abs: 1.3 10*3/uL (ref 0.7–4.0)
MCH: 29.8 pg (ref 26.0–34.0)
MCHC: 32.5 g/dL (ref 30.0–36.0)
MCV: 91.8 fL (ref 80.0–100.0)
Monocytes Absolute: 0.5 10*3/uL (ref 0.1–1.0)
Monocytes Relative: 9 %
Neutro Abs: 3.6 10*3/uL (ref 1.7–7.7)
Neutrophils Relative %: 62 %
Platelets: 214 10*3/uL (ref 150–400)
RBC: 4.66 MIL/uL (ref 4.22–5.81)
RDW: 13.1 % (ref 11.5–15.5)
WBC: 5.7 10*3/uL (ref 4.0–10.5)
nRBC: 0 % (ref 0.0–0.2)

## 2019-06-17 LAB — URINE CULTURE: Culture: 80000 — AB

## 2019-06-17 NOTE — TOC Initial Note (Signed)
Transition of Care (TOC) - Initial/Assessment Note    Patient Details  Name: Douglas [name]. MRN: [id number] Date of Birth: December 28, 1950  Transition of Care [facility]) CM/SW Contact:    Baldemar Lenis, LCSW Phone Number: 06/17/2019, 1:46 PM  Clinical Narrative:   CSW spoke with patient's daughter, [name], about discharge plans. [name] is hopeful that there may be a better SNF option available this time around that he can get into, would like CSW to send referral to some SNFs in the area. If there are no options for SNF that she likes, then she will take the patient home and continue with home health therapy. CSW to send referral and check in with facility preferences to update [name] with choices. CSW to follow.       Expected Discharge Plan: Skilled Nursing Facility Barriers to Discharge: Continued Medical Work up   Patient Goals and CMS Choice Patient states their goals for this hospitalization and ongoing recovery are:: patient unable to participate in goal setting due to disorientation CMS Medicare.gov Compare Post Acute Care list provided to:: Patient Represenative (must comment) Choice offered to / list presented to : Adult Children  Expected Discharge Plan and Services Expected Discharge Plan: Skilled Nursing Facility     Post Acute Care Choice: Skilled Nursing Facility Living arrangements for the past 2 months: Single Family Home                                      Prior Living Arrangements/Services Living arrangements for the past 2 months: Single Family Home Lives with:: Adult Children Patient language and need for interpreter reviewed:: No Do you feel safe going back to the place where you live?: Yes      Need for Family Participation in Patient Care: Yes (Comment) Care giver support system in place?: Yes (comment) Current home services: Home PT, DME Criminal Activity/Legal Involvement Pertinent to Current Situation/Hospitalization: No - Comment as  needed  Activities of Daily Living Home Assistive Devices/Equipment: CBG Meter, Blood pressure cuff, Wheelchair, Environmental consultant (specify type), Bedside commode/3-in-1, Shower chair with back ADL Screening (condition at time of admission) Patient's cognitive ability adequate to safely complete daily activities?: No Is the patient deaf or have difficulty hearing?: No Does the patient have difficulty seeing, even when wearing glasses/contacts?: Yes(right vision loss) Does the patient have difficulty concentrating, remembering, or making decisions?: No Patient able to express need for assistance with ADLs?: Yes Does the patient have difficulty dressing or bathing?: Yes Independently performs ADLs?: No Communication: Dependent Is this a change from baseline?: Pre-admission baseline Dressing (OT): Dependent Is this a change from baseline?: Pre-admission baseline Grooming: Dependent Is this a change from baseline?: Pre-admission baseline Feeding: Independent Bathing: Dependent Is this a change from baseline?: Pre-admission baseline Toileting: Dependent Is this a change from baseline?: Pre-admission baseline In/Out Bed: Dependent Is this a change from baseline?: Pre-admission baseline Walks in Home: Dependent Is this a change from baseline?: Pre-admission baseline Does the patient have difficulty walking or climbing stairs?: Yes Weakness of Legs: Right Weakness of Arms/Hands: Right  Permission Sought/Granted Permission sought to share information with : Facility Medical sales representative, Family Supports Permission granted to share information with : Yes, Verbal Permission Granted  Share Information with NAME: [name]  Permission granted to share info w AGENCY: SNF  Permission granted to share info w Relationship: Daughter     Emotional Assessment Appearance:: Appears stated age Attitude/Demeanor/Rapport:  Unable to Assess Affect (typically observed): Unable to Assess Orientation: : Oriented to  Self Alcohol / Substance Use: Not Applicable Psych Involvement: No (comment)  Admission diagnosis:  Foot pain, right [M79.671] AMS (altered mental status) [R41.82] Patient Active Problem List   Diagnosis Date Noted  . Pressure injury of skin 06/08/2019  . AMS (altered mental status) 06/05/2019  . Ischemic stroke ([place]) 03/05/2019  . UTI (urinary tract infection) 03/05/2019  . Acute blood loss anemia   . Labile blood pressure   . 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 Labile blood glucose   . Global aphasia   . Cerebral edema ([place]) 04/04/2018  . Renal insufficiency 04/04/2018  . 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 Acute lower UTI   . Type 2 diabetes mellitus with peripheral neuropathy (HCC)   . Acute embolic stroke ([place])   . Diabetes 1.5, managed as type 2 ([place])   . Essential hypertension   . Dyslipidemia   . History of CVA with residual deficit   . Dysphagia, post-stroke    PCP:  Jilda Panda, MD Pharmacy:   [id number] - [place], [place]. AT [address] [place] [address] Phone: [phone] Fax: [phone]     Social Determinants of Health (SDOH) Interventions    Readmission Risk Interventions No flowsheet data found.

## 2019-06-17 NOTE — Progress Notes (Signed)
Inpatient Diabetes Program Recommendations  AACE/ADA: New Consensus Statement on Inpatient Glycemic Control (2015)  Target Ranges:  Prepandial:   less than 140 mg/dL      Peak postprandial:   less than 180 mg/dL (1-2 hours)      Critically ill patients:  140 - 180 mg/dL   Lab Results  Component Value Date   GLUCAP 182 (H) 06/16/2019   HGBA1C 6.7 (H) 06/05/2019    Review of Glycemic Control Results for Douglas [name], Douglas [name] (MRN [id number]) as of 06/17/2019 09:03  Ref. Range 06/15/2019 18:22 06/16/2019 07:59 06/16/2019 12:55 06/16/2019 16:00  Glucose-Capillary Latest Ref Range: 70 - 99 mg/dL 211 (H) 155 (H) 208 (H) 182 (H)   Diabetes history: DM2 Outpatient Diabetes medications: Levemir 16 units qd if CBG 125 or > + Novolog 3-4 units bid meal coverage + Glucotrol 10 mg bid Current orders for Inpatient glycemic control: Novolog sensitive correction tid  Inpatient Diabetes Program Recommendations:   -Add Novolog 0-5 units hs May need portion of basal & meal coverage -will review noon CBG.  Thank you, Billy Fischer. Jamal Pavon, RN, MSN, CDE  Diabetes Coordinator Inpatient Glycemic Control Team Team Pager [phone] (8am-5pm) 06/17/2019 9:06 AM

## 2019-06-17 NOTE — NC FL2 (Signed)
[place] LEVEL OF CARE SCREENING TOOL     IDENTIFICATION  Patient Name: Douglas [name]. Birthdate: [birth year] Sex: male Admission Date (Current Location): 06/15/2019  [facility] and [place] Number:  Herbalist and Address:  [facility] [place], [place] [address], [place], [place] [address]      Provider Number: [id number]  Attending Physician Name and Address:  Martyn Malay, MD  Relative Name and Phone Number:       Current Level of Care: Hospital Recommended Level of Care: [place] Prior Approval Number:    Date Approved/Denied:   PASRR Number: [id number]  Discharge Plan: SNF    Current Diagnoses: Patient Active Problem List   Diagnosis Date Noted  . Pressure injury of skin 06/08/2019  . AMS (altered mental status) 06/05/2019  . Ischemic stroke ([place]) 03/05/2019  . UTI (urinary tract infection) 03/05/2019  . Acute blood loss anemia   . Labile blood pressure   . 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 Labile blood glucose   . Global aphasia   . Cerebral edema ([place]) 04/04/2018  . Renal insufficiency 04/04/2018  . 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 Acute lower UTI   . Type 2 diabetes mellitus with peripheral neuropathy (HCC)   . Acute embolic stroke ([place])   . Diabetes 1.5, managed as type 2 ([place])   . Essential hypertension   . Dyslipidemia   . History of CVA with residual deficit   . Dysphagia, post-stroke     Orientation RESPIRATION BLADDER Height & Weight     Self  Normal Incontinent Weight: 194 lb 12.4 oz (88.3 kg) Height:  6\' 1"  (185.4 cm)  BEHAVIORAL SYMPTOMS/MOOD NEUROLOGICAL BOWEL NUTRITION STATUS    Convulsions/Seizures Incontinent Diet(see DC summary)  AMBULATORY STATUS COMMUNICATION OF NEEDS Skin   Extensive Assist Non-Verbally PU Stage and Appropriate Care   PU Stage 2 Dressing: (buttocks, foam dressing: lift every  shift to assess and change PRN)                   Personal Care Assistance Level of Assistance  Bathing, Feeding, Dressing Bathing Assistance: Maximum assistance Feeding assistance: Limited assistance Dressing Assistance: Maximum assistance     Functional Limitations Info  Sight, Hearing, Speech Sight Info: Impaired(right eye vision impaired) Hearing Info: Adequate Speech Info: Impaired(incomprehensible)    SPECIAL CARE FACTORS FREQUENCY  PT (By licensed PT), OT (By licensed OT), Speech therapy     PT Frequency: 5x/wk OT Frequency: 5x/wk     Speech Therapy Frequency: 5x/wk      Contractures Contractures Info: Not present    Additional Factors Info  Code Status, Allergies, Insulin Sliding Scale Code Status Info: Full Allergies Info: Ciprofloxacin, Keppra Levetiracetam, Other ([place])   Insulin Sliding Scale Info: 0-9 units 3x/day with meals       Current Medications (06/17/2019):  This is the current hospital active medication list Current Facility-Administered Medications  Medication Dose Route Frequency Provider Last Rate Last Admin  . acetaminophen (TYLENOL) tablet 650 mg  650 mg Oral Q6H PRN Anderson, Chelsey L, DO      . amLODipine (NORVASC) tablet 10 mg  10 mg Oral Daily Anderson, Chelsey L, DO   10 mg at 06/17/19 0910  . aspirin EC tablet 325 mg  325 mg Oral Daily Anderson, Chelsey L, DO   325 mg at 06/17/19 0911  . calcium citrate (  CALCITRATE - dosed in mg elemental calcium) tablet 475 mg  475 mg Oral Q breakfast Anderson, Chelsey L, DO   475 mg at 06/17/19 0913  . clopidogrel (PLAVIX) tablet 75 mg  75 mg Oral Daily Anderson, Chelsey L, DO   75 mg at 06/17/19 0910  . enoxaparin (LOVENOX) injection 40 mg  40 mg Subcutaneous Daily Anderson, Chelsey L, DO   40 mg at 06/17/19 0914  . hydrALAZINE (APRESOLINE) tablet 75 mg  75 mg Oral TID Dareen Piano, Chelsey L, DO   75 mg at 06/17/19 0909  . insulin aspart (novoLOG) injection 0-9 Units  0-9 Units Subcutaneous TID WC  Westley Chandler, MD   5 Units at 06/17/19 1300  . lamoTRIgine (LAMICTAL) tablet 100 mg  100 mg Oral BID Anderson, Chelsey L, DO   100 mg at 06/17/19 0910  . lisinopril (ZESTRIL) tablet 20 mg  20 mg Oral BID Anderson, Chelsey L, DO   20 mg at 06/17/19 0910  . metoprolol succinate (TOPROL-XL) 24 hr tablet 25 mg  25 mg Oral Daily Anderson, Chelsey L, DO   25 mg at 06/17/19 0910  . pantoprazole (PROTONIX) EC tablet 40 mg  40 mg Oral Daily Anderson, Chelsey L, DO   40 mg at 06/17/19 0911  . Resource ThickenUp Clear   Oral PRN Westley Chandler, MD      . tamsulosin [facility]) capsule 0.4 mg  0.4 mg Oral QPC supper Dareen Piano, Chelsey L, DO   0.4 mg at 06/16/19 1727     Discharge Medications: Please see discharge summary for a list of discharge medications.  Relevant Imaging Results:  Relevant Lab Results:   Additional Information SS#: [id number]  Baldemar Lenis, LCSW

## 2019-06-17 NOTE — Progress Notes (Signed)
SLP Cancellation Note  Patient Details Name: Douglas [name]. MRN: [id number] DOB: 03-29-1951   Cancelled treatment:       Reason Eval/Treat Not Completed: Patient not medically ready;Patient at procedure or test/unavailable(Pt has received Ativan for MRI which will be completed prior to the scheduled MBS. MBS will therefore be deferred until 06/18/19. RN has been updated regarding this plan.)  Huntleigh Doolen I. Vear Clock, MS, CCC-SLP Acute Rehabilitation Services Office number [phone] Pager [phone]  Scheryl Marten 06/17/2019, 11:37 AM

## 2019-06-17 NOTE — Plan of Care (Signed)
°  Problem: Coping: °Goal: Level of anxiety will decrease °Outcome: Progressing °  °

## 2019-06-17 NOTE — Progress Notes (Addendum)
Family Medicine Teaching Service Daily Progress Note Intern Pager: [phone]  Patient name: Douglas [name]. Medical record number: [id number] Date of birth: Jun 04, 1950 Age: 69 y.o. Gender: male  Primary Care Provider: Ralene Ok, MD Consultants: none Code Status: full  Pt Overview and Major Events to Date:  2/13 admitted for AMS  Assessment and Plan: Douglas Gatt. is a 69 y.o. male presenting with AMS . PMH is significant for AMS, h/o CVA with right-sided weakness, HTN  AMS Appears to be at baseline.  Only answers yes to questions at baseline.  Patient does not appear to be in distress. EEG showed evidence of epileptogenicity as well as cortical dysfunction in left frontotemporal likely secondary to underlying infarct without seizures.  -MRI brain to assess for CVA - f/u ionized calcium - PT/OT - maintenance IV fluids - continue home calcium supplement  Possible leg pain- foot xray shows no acute bony abnormalities.  Right tib-fib x-ray without acute fracture. - tylenol PRN - recommend podiatry follow up for nail care  H/o CVA with residual right-sided hemiplegia- unchanged - PT/OT - continue asa, plavix - f/u MRI brain  HTN- most recent blood pressure 110/78.  Systolic range over the last 24 hours 110-160.  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 hgb A1c 6.7 this month. CBG this am of 182 - hold home med. Consider sSSI if elevated BS  BPH with frequent UTIs. Urinalysis negative on presentation. Condom cath in place - continue tamsulosin - strict I/O  FEN/GI:  Dysphagia 3 Prophylaxis: lovenox  Disposition: likely discharge to SNF  Subjective:  Patient does not appear to be in any distress.  Plan for MRI later today.  Objective: Temp:  [97.5 F (36.4 C)-98.5 F (36.9 C)] 98.3 F (36.8 C) (02/15 0411) Pulse Rate:  [78-90]  78 (02/15 0411) Resp:  [17-19] 18 (02/15 0411) BP: (110-160)/(78-97) 110/78 (02/15 0411) SpO2:  [98 %-100 %] 99 % (02/15 0411) Physical Exam: General: Only answers yes to questions, does not appear to be in any distress Heart: Regular rate and rhythm with no murmurs appreciated Lungs: CTA bilaterally Abdomen: Bowel sounds present, no abdominal pain Skin: Warm and dry  Laboratory: Recent Labs  Lab 06/15/19 1945 06/16/19 0110  WBC 5.4 6.5  HGB 14.6 14.1  HCT 46.2 43.9  PLT 233 226   Recent Labs  Lab 06/15/19 1945 06/16/19 0110  NA 144  --   K 4.8  --   CL 110  --   CO2 23  --   BUN 13  --   CREATININE 0.85 0.91  CALCIUM 8.5*  --   PROT 6.6  --   BILITOT 0.7  --   ALKPHOS 77  --   ALT 23  --   AST 24  --   GLUCOSE 150*  --    Ionized calcium pending COVID negative  Imaging/Diagnostic Tests: DG Tibia/Fibula Right  Result Date: 06/16/2019 CLINICAL DATA:  Right-sided weakness EXAM: RIGHT TIBIA AND FIBULA - 2 VIEW COMPARISON:  None. FINDINGS: Mild degenerative changes of the knee joint are noted. No acute fracture or dislocation is seen. No soft tissue abnormality is noted. IMPRESSION: Degenerative change without acute abnormality. Electronically Signed   By: 06/18/19 M.D.   On: 06/16/2019 15:40   CT Head Wo Contrast  Result Date: 06/15/2019 CLINICAL DATA:  Altered level of consciousness, right-sided weakness and  speech deficits EXAM: CT HEAD WITHOUT CONTRAST TECHNIQUE: Contiguous axial images were obtained from the base of the skull through the vertex without intravenous contrast. COMPARISON:  06/05/2019 FINDINGS: Brain: Chronic left cerebral encephalomalacia consistent with previous infarct. No signs of acute infarct or hemorrhage. There is ex vacuo dilatation of the lateral ventricle, stable. Midline structures are unremarkable. No acute extra-axial fluid collections. No mass effect. Vascular: No hyperdense vessel or unexpected calcification. Skull: Normal. Negative  for fracture or focal lesion. Sinuses/Orbits: No acute finding. Other: None IMPRESSION: 1. Chronic encephalomalacia related to previous left PCA and MCA territory infarcts. 2. No acute intracranial process. Electronically Signed   By: Randa Ngo M.D.   On: 06/15/2019 19:05   DG Foot 2 Views Right  Result Date: 06/15/2019 CLINICAL DATA:  Pain EXAM: RIGHT FOOT - 2 VIEW COMPARISON:  None. FINDINGS: The osseous structures appear diffusely demineralized which may limit detection of small or nondisplaced fractures. No acute bony abnormality. Specifically, no fracture, subluxation, or dislocation. Irregularity of the nail bed of the first digit with questionable soft tissue disruption. No soft tissue gas or foreign body is seen. Clawtoe deformities of the second through fifth digits. Corticated accessory os peroneum is noted. Apparent pes planus deformity though incompletely assessed on nonweightbearing films. IMPRESSION: 1. No acute bony abnormality. 2. Irregularity of the nail bed of the first digit with questionable soft tissue disruption, correlate with visual inspection. 3. Clawtoe deformities of the second through fifth digits. 4. Apparent pes planus, incompletely assessed on nonweightbearing views. Electronically Signed   By: Lovena Le M.D.   On: 06/15/2019 22:37   EEG adult  Result Date: 06/16/2019 Lora Havens, MD     06/16/2019  2:10 PM Patient Name: Douglas [name]. MRN: [id number] Epilepsy Attending: Lora Havens Referring Physician/Provider:  Dr Ermalene Searing Date: 06/16/2019 Duration: 23.27 mins Patient history: 69 y.o.malewith h/o CVA with right-sided weakness presenting with AMS. EEG to evaluate for seizure. Level of alertness: awake, asleep AEDs during EEG study: LTG Technical aspects: This EEG study was done with scalp electrodes positioned according to the 10-20 International system of electrode placement. Electrical activity was acquired at a sampling rate of 500Hz  and  reviewed with a high frequency filter of 70Hz  and a low frequency filter of 1Hz . EEG data were recorded continuously and digitally stored. DESCRIPTION: The posterior dominant rhythm consists of 9-10 Hz activity of moderate voltage (25-35 uV) seen predominantly in posterior head regions, symmetric and reactive to eye opening and eye closing. Sleep was characterized by vertex waves, sleep spindles (12-14hz ), maximal frontocentral. EEG showed continuous generalized 3-5hz  theta-delta slowing in left frontotemporal region as well as sharp waves in left fronto-temporal region ( maximal F7, FP1).  Hyperventilation and photic stimulation were not performed. ABNORMALITY - Sharp wave, left frontotemporal - Cotinuous slow,  left frontotemporal IMPRESSION: This study showed evidence of epileptogenicity as well as cortical dysfunction in left frontotemporal likely secondary to underlying infarct. No seizures were seen throughout the recording. Lora Havens   Brain MRI and EEG pending  Lurline Del, DO 06/17/2019, 6:26 AM PGY-1, [place] Intern pager: [phone], text pages welcome

## 2019-06-18 ENCOUNTER — Inpatient Hospital Stay (HOSPITAL_COMMUNITY): Payer: Medicare Other

## 2019-06-18 LAB — CBC
HCT: 39.6 % (ref 39.0–52.0)
Hemoglobin: 13.1 g/dL (ref 13.0–17.0)
MCH: 29.9 pg (ref 26.0–34.0)
MCHC: 33.1 g/dL (ref 30.0–36.0)
MCV: 90.4 fL (ref 80.0–100.0)
Platelets: 205 10*3/uL (ref 150–400)
RBC: 4.38 MIL/uL (ref 4.22–5.81)
RDW: 12.7 % (ref 11.5–15.5)
WBC: 5.1 10*3/uL (ref 4.0–10.5)
nRBC: 0 % (ref 0.0–0.2)

## 2019-06-18 LAB — BASIC METABOLIC PANEL
Anion gap: 9 (ref 5–15)
BUN: 12 mg/dL (ref 8–23)
CO2: 26 mmol/L (ref 22–32)
Calcium: 9.1 mg/dL (ref 8.9–10.3)
Chloride: 103 mmol/L (ref 98–111)
Creatinine, Ser: 0.97 mg/dL (ref 0.61–1.24)
GFR calc Af Amer: >60 mL/min (ref >60)
GFR calc non Af Amer: >60 mL/min (ref >60)
Glucose, Bld: 174 mg/dL — ABNORMAL HIGH (ref 70–99)
Potassium: 3.9 mmol/L (ref 3.5–5.1)
Sodium: 138 mmol/L (ref 135–145)

## 2019-06-18 LAB — GLUCOSE, CAPILLARY
Glucose-Capillary: 170 mg/dL — ABNORMAL HIGH (ref 70–99)
Glucose-Capillary: 225 mg/dL — ABNORMAL HIGH (ref 70–99)
Glucose-Capillary: 189 mg/dL — ABNORMAL HIGH (ref 70–99)
Glucose-Capillary: 228 mg/dL — ABNORMAL HIGH (ref 70–99)

## 2019-06-18 LAB — CUP PACEART REMOTE DEVICE CHECK
Date Time Interrogation Session: 20210215180525
Implantable Pulse Generator Implant Date: 20201106

## 2019-06-18 MED ORDER — ATORVASTATIN CALCIUM 40 MG PO TABS
40.0000 mg | ORAL_TABLET | Freq: Every day | ORAL | Status: DC
  Administered 2019-06-18 – 2019-06-21 (×4): 40 mg via ORAL
  Filled 2019-06-18 (×4): qty 1

## 2019-06-18 MED ORDER — METOPROLOL SUCCINATE ER 50 MG PO TB24
50.0000 mg | ORAL_TABLET | Freq: Every day | ORAL | Status: DC
  Administered 2019-06-19 – 2019-06-20 (×2): 50 mg via ORAL
  Filled 2019-06-18 (×2): qty 1

## 2019-06-18 NOTE — Plan of Care (Signed)
Plan of Care reviewed. 

## 2019-06-18 NOTE — Care Management Important Message (Signed)
Important Message  Patient Details  Name: Douglas [name]. MRN: [id number] Date of Birth: 1950/07/28   Medicare Important Message Given:     Due to illness patient could not sign unsigned copy left at patient bedside.    Yandiel Bergum 06/18/2019, 1:56 PM

## 2019-06-18 NOTE — Progress Notes (Signed)
ILR Remote 

## 2019-06-18 NOTE — Progress Notes (Signed)
Physical Therapy Treatment Patient Details Name: Douglas [name]. MRN: [id number] DOB: 10-25-1950 Today's Date: 06/18/2019    History of Present Illness 69 y.o. male presenting with AMS. PMHx is significant for HTN, DM, h/o CVA with right-sided weakness. Of note pt with recent admit and d/c home on 2/7 for AMS. Head CT on 2/13 was negative for acute intracranial process.  MRI only remote findings    PT Comments    Pt supine in bed perseverating on L forearm at IV site.  He progressed to standing x2 and returned back to bed.  Pt continues to benefit from HHPT with 24 hr assistance at d/c.  Plan for progression of mobility next session.     Follow Up Recommendations  Home health PT;Supervision for mobility/OOB;Supervision/Assistance - 24 hour     Equipment Recommendations  None recommended by PT    Recommendations for Other Services       Precautions / Restrictions Precautions Precautions: Fall Precaution Comments: high tone in RUE and RLE Restrictions Weight Bearing Restrictions: No    Mobility  Bed Mobility Overal bed mobility: Needs Assistance Bed Mobility: Supine to Sit     Supine to sit: +2 for physical assistance;Max assist Sit to supine: Max assist;+2 for physical assistance   General bed mobility comments: Max +2 to advance LEs and elevate trunk into a seated position noted with L lateral lean.  Transfers Overall transfer level: Needs assistance Equipment used: Rolling walker (2 wheeled) Transfers: Sit to/from Stand Sit to Stand: +2 physical assistance;Max assist         General transfer comment: Pt utilized LUE to pull into standing with assistance to boost into standing x2 trials,  Noted with breakdown on his bottom and nursing in to assess.  Ambulation/Gait             General Gait Details: unable   Stairs             Wheelchair Mobility    Modified Rankin (Stroke Patients Only) Modified Rankin (Stroke Patients Only) Pre-Morbid  Rankin Score: Severe disability Modified Rankin: Severe disability     Balance Overall balance assessment: Needs assistance Sitting-balance support: Single extremity supported;Feet supported Sitting balance-Leahy Scale: Poor Sitting balance - Comments: left lateral lean -- prolonged sitting able to progress to min guard static sitting   Standing balance support: Single extremity supported;During functional activity Standing balance-Leahy Scale: Poor Standing balance comment: stedy with L UE holding stedy to stand                            Cognition Arousal/Alertness: Awake/alert Behavior During Therapy: Flat affect Overall Cognitive Status: Difficult to assess                                 General Comments: pt only verbalizes "yes, hi and wow" the entire session. pt inconsistent on yes responses. pt saying "yes yes yes" very fast and close together when addressing L forearm. When IV removed patient stopped      Exercises      General Comments General comments (skin integrity, edema, etc.): RN called to room to address IV site and second RN called to address noted blood on pad from sacral wound on Left skin fold of buttock. pt with early signs of break down on R buttock      Pertinent Vitals/Pain Pain Assessment: Faces Faces Pain Scale:  Hurts a little bit Pain Location: pointing to L forearm, RN removing IV Pain Descriptors / Indicators: Guarding Pain Intervention(s): Monitored during session;Repositioned    Home Living                      Prior Function            PT Goals (current goals can now be found in the care plan section) Acute Rehab PT Goals Patient Stated Goal: pt only states "yes" to any question Potential to Achieve Goals: Fair Progress towards PT goals: Progressing toward goals    Frequency    Min 3X/week      PT Plan Current plan remains appropriate    Co-evaluation PT/OT/SLP Co-Evaluation/Treatment:  Yes Reason for Co-Treatment: Complexity of the patient's impairments (multi-system involvement) PT goals addressed during session: Mobility/safety with mobility OT goals addressed during session: ADL's and self-care      AM-PAC PT "6 Clicks" Mobility   Outcome Measure  Help needed turning from your back to your side while in a flat bed without using bedrails?: Total Help needed moving from lying on your back to sitting on the side of a flat bed without using bedrails?: Total Help needed moving to and from a bed to a chair (including a wheelchair)?: Total Help needed standing up from a chair using your arms (e.g., wheelchair or bedside chair)?: Total Help needed to walk in hospital room?: Total Help needed climbing 3-5 steps with a railing? : Total 6 Click Score: 6    End of Session Equipment Utilized During Treatment: Gait belt Activity Tolerance: Patient tolerated treatment well Patient left: in bed;with call bell/phone within reach;with bed alarm set Nurse Communication: Mobility status;Need for lift equipment;Other (comment) PT Visit Diagnosis: Other abnormalities of gait and mobility (R26.89);Unsteadiness on feet (R26.81);Muscle weakness (generalized) (M62.81);Other symptoms and signs involving the nervous system (R29.898);Hemiplegia and hemiparesis Hemiplegia - Right/Left: Right Hemiplegia - dominant/non-dominant: Dominant     Time: 7564-3329 PT Time Calculation (min) (ACUTE ONLY): 30 min  Charges:  $Therapeutic Activity: 8-22 mins                     Erasmo Leventhal , PTA Acute Rehabilitation Services Pager [phone] Office [phone]     Galit Urich Eli Hose 06/18/2019, 3:32 PM

## 2019-06-18 NOTE — Progress Notes (Signed)
Occupational Therapy Treatment Patient Details Name: Douglas [name]. MRN: [id number] DOB: 07/27/1950 Today's Date: 06/18/2019    History of present illness 69 y.o. male presenting with AMS. PMHx is significant for HTN, DM, h/o CVA with right-sided weakness. Of note pt with recent admit and d/c home on 2/7 for AMS. Head CT on 2/13 was negative for acute intracranial process.  MRI only remote findings   OT comments  Pt tolerated static standing in stedy total +2 mod (A) this session. Pt requires total +2 for eob transfer. Pt with facilitation of neuromuscular ed to find midline and static sitting. Pt with decrease cervical rotation to the R flared R ribs and pulling with L UE on bed surface.    Follow Up Recommendations  SNF(HHOT if family can handld at current care level)    [facility] bed;Other (comment)(hoyer lift )    Recommendations for Other Services      Precautions / Restrictions Precautions Precautions: Fall Precaution Comments: high tone in RUE and RLE       Mobility Bed Mobility Overal bed mobility: Needs Assistance Bed Mobility: Supine to Sit;Sit to Supine     Supine to sit: Max assist;+2 for physical assistance;+2 for safety/equipment Sit to supine: Max assist;+2 for physical assistance   General bed mobility comments: pt requires (A) to bring BIL LE off eob and elevated trunk from surface. pt with pad used to scoot to eob. pt with L lateral lean   Transfers Overall transfer level: Needs assistance   Transfers: Sit to/from Stand Sit to Stand: Max assist;+2 physical assistance;+2 safety/equipment;Mod assist         General transfer comment: pt sit<>Stand total +2 max pt very automatic with L UE onto bar    Balance Overall balance assessment: Needs assistance Sitting-balance support: Single extremity supported;Feet supported Sitting balance-Leahy Scale: Poor Sitting balance - Comments: left lateral lean -- prolonged sitting  able to progress to min guard static sitting   Standing balance support: Single extremity supported;During functional activity Standing balance-Leahy Scale: Zero Standing balance comment: stedy with L UE holding stedy to stand                           ADL either performed or assessed with clinical judgement   ADL Overall ADL's : Needs assistance/impaired                                       General ADL Comments: Pt progressed to EOB and static standing for addressing wound on buttock     Vision       Perception     Praxis      Cognition Arousal/Alertness: Awake/alert Behavior During Therapy: Flat affect Overall Cognitive Status: Difficult to assess                                 General Comments: pt only verbalizes "yes, hi and wow" the entire session. pt inconsistent on yes responses. pt saying "yes yes yes" very fast and close together when addressing L forearm. When IV removed patient stopped        Exercises     Shoulder Instructions       General Comments RN called to room to address IV site and second RN called to address noted blood  on pad from sacral wound on Left skin fold of buttock. pt with early signs of break down on R buttock    Pertinent Vitals/ Pain       Pain Assessment: Faces Pain Location: pointing to L forearm, RN removing IV Pain Descriptors / Indicators: Guarding Pain Intervention(s): Monitored during session;Repositioned;Other (comment)(RN removed)  Home Living                                          Prior Functioning/Environment              Frequency  Min 2X/week        Progress Toward Goals  OT Goals(current goals can now be found in the care plan section)  Progress towards OT goals: Progressing toward goals  Acute Rehab OT Goals Patient Stated Goal: pt only states "yes" to any question OT Goal Formulation: Patient unable to participate in goal setting Time  For Goal Achievement: 06/30/19 Potential to Achieve Goals: Fair ADL Goals Pt Will Perform Eating: with set-up;sitting;with adaptive utensils Pt Will Perform Grooming: with set-up;with supervision;sitting Pt Will Perform Upper Body Dressing: with min assist;sitting Pt Will Transfer to Toilet: with mod assist;stand pivot transfer;squat pivot transfer;bedside commode Additional ADL Goal #1: Pt will be Min A up to EOB for basic ADLs and transfers Additional ADL Goal #2: Pt will follow commands 25% of time with multimodel cues  Plan Discharge plan remains appropriate    Co-evaluation    PT/OT/SLP Co-Evaluation/Treatment: Yes Reason for Co-Treatment: Complexity of the patient's impairments (multi-system involvement);Necessary to address cognition/behavior during functional activity;For patient/therapist safety;To address functional/ADL transfers   OT goals addressed during session: Proper use of Adaptive equipment and DME;Strengthening/ROM;ADL's and self-care      AM-PAC OT "6 Clicks" Daily Activity     Outcome Measure   Help from another person eating meals?: A Lot Help from another person taking care of personal grooming?: A Lot Help from another person toileting, which includes using toliet, bedpan, or urinal?: Total Help from another person bathing (including washing, rinsing, drying)?: A Lot Help from another person to put on and taking off regular upper body clothing?: A Lot Help from another person to put on and taking off regular lower body clothing?: Total 6 Click Score: 10    End of Session Equipment Utilized During Treatment: Gait belt  OT Visit Diagnosis: Unsteadiness on feet (R26.81);Other abnormalities of gait and mobility (R26.89);Other symptoms and signs involving cognitive function;Cognitive communication deficit (R41.841);Hemiplegia and hemiparesis Symptoms and signs involving cognitive functions: Cerebral infarction Hemiplegia - Right/Left: Right Hemiplegia -  dominant/non-dominant: Dominant Hemiplegia - caused by: Cerebral infarction   Activity Tolerance Patient tolerated treatment well   Patient Left in bed;with call bell/phone within reach;with bed alarm set;with nursing/sitter in room   Nurse Communication Mobility status        Time: 1046(1046)-1115 OT Time Calculation (min): 29 min  Charges: OT General Charges $OT Visit: 1 Visit OT Treatments $Self Care/Home Management : 8-22 mins   Brynn, OTR/L  Acute Rehabilitation Services Pager: [phone] Office: [phone] .    Mateo Flow 06/18/2019, 1:26 PM

## 2019-06-18 NOTE — Progress Notes (Signed)
Discussed 2/15 EEG with Dr. Amada Jupiter, neurology, regarding epileptogenicity and anti-epileptic treatment. Patient with history of seizures, currently on lamictal, previously on lamictal and keppra, but keppra d/c'd due to agitation. Dr. Amada Jupiter recommended no change as epileptogenicity predisposes patient to seizures, but is adequately treated with lamictal.  Shirlean Mylar, MD [facility] Family Medicine Res[facility], PGY-1

## 2019-06-18 NOTE — Progress Notes (Signed)
Pt working with PT/OT, agitated.  HR decreased back to 90s after treatment.

## 2019-06-18 NOTE — Progress Notes (Signed)
Family Medicine Teaching Service Daily Progress Note Intern Pager: [phone]  Patient name: Douglas [name]. Medical record number: [id number] Date of birth: 06-20-1950 Age: 69 y.o. Gender: male  Primary Care Provider: Ralene Ok, MD Consultants: none Code Status: full  Pt Overview and Major Events to Date:  2/13 admitted for AMS 2/16 MRI with no acute process, EEG w/ e/o eleptogenicity and chronic cortical dysfunction 2/2 to underlying infarct  Assessment and Plan: Douglas [name]. is a 69 y.o. male presenting with AMS . PMH is significant for AMS, h/o CVA with right-sided weakness, HTN.  AMS Patient at cognitive baseline per daughter: only answers yes to questions at baseline.  Medically stable for discharge. Patient does not appear to be in distress. EEG showed evidence of epileptogenicity as well as cortical dysfunction in left frontotemporal likely secondary to underlying infarct without seizures. MRI without acute process. Per neurology, lamictal is adequate treatment for the epileptogenicity seen on EEG, no further tx necessary. - Continue lamictal. - Ionized calcium 5.3, no change from 2 weeks ago 5.2 - PT/OT - maintenance IV fluids - continue home calcium supplement  Possible leg pain- foot xray shows no acute bony abnormalities.  Right tib-fib x-ray without acute fracture. - tylenol PRN - recommend podiatry follow up for nail care  H/o CVA with residual right-sided hemiplegia- unchanged MRI without acute process, small vessel ischemia and old infarcts - PT/OT - restart atorvastatin 40 mg daily - continue DAPT: asa, plavix; to end after 3 months of treatment, 06/30/19.  HTN- most recent blood pressure 153/86  Systolic range over the last 24 hours 134-155.  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 hgb A1c 6.7 this month. CBG this am of  170 - hold home med. Consider sSSI if elevated BS. Stable for now.  BPH with frequent UTIs. Urinalysis negative on presentation. Condom cath in place - continue tamsulosin - strict I/O   FEN/GI:  Dysphagia 3 Prophylaxis: lovenox  Disposition: likely discharge to SNF  Subjective:  Patient at cognitive baseline, daughter reports that he appears to be more "himself" today.  Objective: Temp:  [97.5 F (36.4 C)-98.7 F (37.1 C)] 98.6 F (37 C) (02/16 0412) Pulse Rate:  [71-87] 78 (02/16 0412) Resp:  [18-20] 18 (02/16 0412) BP: (134-155)/(81-97) 153/86 (02/16 0412) SpO2:  [98 %-100 %] 98 % (02/16 0412) Physical Exam: General: Only answers yes to questions, does not appear to be in any distress Heart: Regular rate and rhythm with no murmurs appreciated Lungs: CTA bilaterally Abdomen: Bowel sounds present, no abdominal pain Skin: Warm and dry Extremities: no edema  Laboratory: Recent Labs  Lab 06/16/19 0110 06/17/19 1321 06/18/19 0223  WBC 6.5 5.7 5.1  HGB 14.1 13.9 13.1  HCT 43.9 42.8 39.6  PLT 226 214 205   Recent Labs  Lab 06/15/19 1945 06/15/19 1945 06/16/19 0110 06/17/19 1321 06/18/19 0223  NA 144  --   --  136 138  K 4.8  --   --  4.0 3.9  CL 110  --   --  102 103  CO2 23  --   --  25 26  BUN 13  --   --  17 12  CREATININE 0.85   < > 0.91 1.12 0.97  CALCIUM 8.5*  --   --  9.5 9.1  PROT 6.6  --   --   --   --  BILITOT 0.7  --   --   --   --   ALKPHOS 77  --   --   --   --   ALT 23  --   --   --   --   AST 24  --   --   --   --   GLUCOSE 150*  --   --  277* 174*   < > = values in this interval not displayed.   Ionized calcium pending COVID negative  Imaging/Diagnostic Tests: DG Tibia/Fibula Right  Result Date: 06/16/2019 CLINICAL DATA:  Right-sided weakness EXAM: RIGHT TIBIA AND FIBULA - 2 VIEW COMPARISON:  None. FINDINGS: Mild degenerative changes of the knee joint are noted. No acute fracture or dislocation is seen. No soft tissue abnormality is  noted. IMPRESSION: Degenerative change without acute abnormality. Electronically Signed   By: Inez Catalina M.D.   On: 06/16/2019 15:40   MR BRAIN WO CONTRAST  Result Date: 06/17/2019 CLINICAL DATA:  Subacute neuro deficits. EXAM: MRI HEAD WITHOUT CONTRAST TECHNIQUE: Multiplanar, multiecho pulse sequences of the brain and surrounding structures were obtained without intravenous contrast. COMPARISON:  Head CT from 2 days ago.  Brain MRI 03/05/2019 FINDINGS: Brain: Chronic diffusion hyperintensity at the left corona radiata. There has been extensive remote left MCA and PCA distribution infarct with dense encephalomalacia and volume loss. The brain is globally atrophic and there is confluent FLAIR hyperintensity in the deep white matter. Superimposed Wallerian changes are seen descending the cortical spinal tracts on the left. Numerous remote micro hemorrhages with a chronic hypertensive pattern. Multiple remote lacunar infarcts at the level of the brainstem. There is hemosiderin staining along the prior cortical infarct. No acute infarct, acute hemorrhage, hydrocephalus, or collection. Vascular: Evidence of high-grade distal right V4 segment stenosis with altered flow void. There is cut off of the left distal M1 segment correlating with the acute infarct. The left ICA is small smaller than the right, likely underfilling. Skull and upper cervical spine: No evidence of marrow lesion Sinuses/Orbits: Negative IMPRESSION: 1. No acute finding. 2. Remote left MCA and PCA infarction. 3. Severe chronic small vessel ischemia. Electronically Signed   By: Monte Fantasia M.D.   On: 06/17/2019 11:57   EEG adult  Result Date: 06/16/2019 Lora Havens, MD     06/16/2019  2:10 PM Patient Name: [name] MRN: [id number] Epilepsy Attending: Lora Havens Referring Physician/Provider:  Dr Ermalene Searing Date: 06/16/2019 Duration: 23.27 mins Patient history: 69 y.o.malewith h/o CVA with right-sided weakness  presenting with AMS. EEG to evaluate for seizure. Level of alertness: awake, asleep AEDs during EEG study: LTG Technical aspects: This EEG study was done with scalp electrodes positioned according to the 10-20 International system of electrode placement. Electrical activity was acquired at a sampling rate of 500Hz  and reviewed with a high frequency filter of 70Hz  and a low frequency filter of 1Hz . EEG data were recorded continuously and digitally stored. DESCRIPTION: The posterior dominant rhythm consists of 9-10 Hz activity of moderate voltage (25-35 uV) seen predominantly in posterior head regions, symmetric and reactive to eye opening and eye closing. Sleep was characterized by vertex waves, sleep spindles (12-14hz ), maximal frontocentral. EEG showed continuous generalized 3-5hz  theta-delta slowing in left frontotemporal region as well as sharp waves in left fronto-temporal region ( maximal F7, FP1).  Hyperventilation and photic stimulation were not performed. ABNORMALITY - Sharp wave, left frontotemporal - Cotinuous slow,  left frontotemporal IMPRESSION: This study showed evidence of epileptogenicity  as well as cortical dysfunction in left frontotemporal likely secondary to underlying infarct. No seizures were seen throughout the recording. Charlsie Quest   MR BRAIN WO CONTRAST  Result Date: 06/17/2019 CLINICAL DATA:  Subacute neuro deficits. EXAM: MRI HEAD WITHOUT CONTRAST TECHNIQUE: Multiplanar, multiecho pulse sequences of the brain and surrounding structures were obtained without intravenous contrast. COMPARISON:  Head CT from 2 days ago.  Brain MRI 03/05/2019 FINDINGS: Brain: Chronic diffusion hyperintensity at the left corona radiata. There has been extensive remote left MCA and PCA distribution infarct with dense encephalomalacia and volume loss. The brain is globally atrophic and there is confluent FLAIR hyperintensity in the deep white matter. Superimposed Wallerian changes are seen descending the  cortical spinal tracts on the left. Numerous remote micro hemorrhages with a chronic hypertensive pattern. Multiple remote lacunar infarcts at the level of the brainstem. There is hemosiderin staining along the prior cortical infarct. No acute infarct, acute hemorrhage, hydrocephalus, or collection. Vascular: Evidence of high-grade distal right V4 segment stenosis with altered flow void. There is cut off of the left distal M1 segment correlating with the acute infarct. The left ICA is small smaller than the right, likely underfilling. Skull and upper cervical spine: No evidence of marrow lesion Sinuses/Orbits: Negative IMPRESSION: 1. No acute finding. 2. Remote left MCA and PCA infarction. 3. Severe chronic small vessel ischemia. Electronically Signed   By: Marnee Spring M.D.   On: 06/17/2019 11:57    Shirlean Mylar, MD 06/18/2019, 7:56 AM PGY-1, [facility] Family Medicine FPTS Intern pager: [phone], text pages welcome

## 2019-06-18 NOTE — Progress Notes (Signed)
Modified Barium Swallow Progress Note  Patient Details  Name: Douglas [name]. MRN: [id number] Date of Birth: 12/29/1950  Today's Date: 06/18/2019  Modified Barium Swallow completed.  Full report located under Chart Review in the Imaging Section.  Brief recommendations include the following:  Clinical Impression  Moderate oropharyngeal dysphagia comprised of impairments in bolus cohesion/control and timing/coordination/onset of swallow resulting in penetration/aspiration of thin/nectar. Bolus falling sublingually but able to manipulate and cup on tongue without significant delays. Unable to fully view but potential for significant pocketing on right side. Majority of liquids reached his pyriform sinuses and sat for up to 7 seconds at max. Barium in pyriform sinuses was aspirated once swallow triggered with pt softly throat clearing. Pharyngeal residue was less with honey due to increased coordination during swallow. Nectar thick residue was mild in valleculae and pyriforms and cleared when he was able to swallow with cues. Honey thick recommended to mitigate risk with nectar even further; continue Dys 3 texture, crush meds, no straws, full assist for strategies and check right oral cavity for residue. ST will continue to assist with texture safety and possible respiratory muscle strength training if pt able.    Swallow Evaluation Recommendations       SLP Diet Recommendations: Dysphagia 3 (Mech soft) solids;Honey thick liquids   Liquid Administration via: Cup;No straw   Medication Administration: Crushed with puree   Supervision: Patient able to self feed;Full supervision/cueing for compensatory strategies   Compensations: Minimize environmental distractions;Slow rate;Small sips/bites;Lingual sweep for clearance of pocketing   Postural Changes: Remain semi-upright after after feeds/meals (Comment)   Oral Care Recommendations: Oral care BID        [name] 06/18/2019,10:23 AM   Breck Coons Lonell Face.Ed Nurse, children's [phone] Office [phone]

## 2019-06-19 DIAGNOSIS — N182 Chronic kidney disease, stage 2 (mild): Secondary | ICD-10-CM

## 2019-06-19 DIAGNOSIS — E1122 Type 2 diabetes mellitus with diabetic chronic kidney disease: Secondary | ICD-10-CM

## 2019-06-19 LAB — CBC
HCT: 46.6 % (ref 39.0–52.0)
Hemoglobin: 15.5 g/dL (ref 13.0–17.0)
MCH: 30.5 pg (ref 26.0–34.0)
MCHC: 33.3 g/dL (ref 30.0–36.0)
MCV: 91.6 fL (ref 80.0–100.0)
Platelets: 253 10*3/uL (ref 150–400)
RBC: 5.09 MIL/uL (ref 4.22–5.81)
RDW: 12.8 % (ref 11.5–15.5)
WBC: 7 10*3/uL (ref 4.0–10.5)
nRBC: 0 % (ref 0.0–0.2)

## 2019-06-19 LAB — GLUCOSE, CAPILLARY
Glucose-Capillary: 243 mg/dL — ABNORMAL HIGH (ref 70–99)
Glucose-Capillary: 229 mg/dL — ABNORMAL HIGH (ref 70–99)
Glucose-Capillary: 269 mg/dL — ABNORMAL HIGH (ref 70–99)
Glucose-Capillary: 202 mg/dL — ABNORMAL HIGH (ref 70–99)

## 2019-06-19 LAB — SARS CORONAVIRUS 2 (TAT 6-24 HRS): SARS Coronavirus 2: NEGATIVE

## 2019-06-19 LAB — BASIC METABOLIC PANEL
Anion gap: 13 (ref 5–15)
BUN: 10 mg/dL (ref 8–23)
CO2: 26 mmol/L (ref 22–32)
Calcium: 9.7 mg/dL (ref 8.9–10.3)
Chloride: 99 mmol/L (ref 98–111)
Creatinine, Ser: 0.92 mg/dL (ref 0.61–1.24)
GFR calc Af Amer: >60 mL/min (ref >60)
GFR calc non Af Amer: >60 mL/min (ref >60)
Glucose, Bld: 235 mg/dL — ABNORMAL HIGH (ref 70–99)
Potassium: 3.7 mmol/L (ref 3.5–5.1)
Sodium: 138 mmol/L (ref 135–145)

## 2019-06-19 MED ORDER — INDAPAMIDE 1.25 MG PO TABS
1.2500 mg | ORAL_TABLET | Freq: Every day | ORAL | Status: DC
  Administered 2019-06-19 – 2019-06-21 (×3): 1.25 mg via ORAL
  Filled 2019-06-19 (×4): qty 1

## 2019-06-19 MED ORDER — METFORMIN HCL 500 MG PO TABS
500.0000 mg | ORAL_TABLET | Freq: Every day | ORAL | Status: DC
  Administered 2019-06-20: 500 mg via ORAL
  Filled 2019-06-19: qty 1

## 2019-06-19 NOTE — Progress Notes (Signed)
Inpatient Diabetes Program Recommendations  AACE/ADA: New Consensus Statement on Inpatient Glycemic Control (2015)  Target Ranges:  Prepandial:   less than 140 mg/dL      Peak postprandial:   less than 180 mg/dL (1-2 hours)      Critically ill patients:  140 - 180 mg/dL   Lab Results  Component Value Date   GLUCAP 243 (H) 06/19/2019   HGBA1C 6.7 (H) 06/05/2019    Review of Glycemic Control  Inpatient Diabetes Program Recommendations:   -Add Levemir 8 units qd (50% home basal dose) -Novolog 2 units tid meal coverage if eats 50% -Add Novolog 0-5 units hs correction  Thank you, Douglas Hong E. Hildegard Hlavac, RN, MSN, CDE  Diabetes Coordinator Inpatient Glycemic Control Team Team Pager [phone] (8am-5pm) 06/19/2019 10:12 AM

## 2019-06-19 NOTE — Plan of Care (Signed)
  Problem: Pain Managment: Goal: General experience of comfort will improve Outcome: Progressing   Problem: Safety: Goal: Ability to remain free from injury will improve Outcome: Progressing   Problem: Skin Integrity: Goal: Risk for impaired skin integrity will decrease Outcome: Progressing   

## 2019-06-19 NOTE — Plan of Care (Signed)
  Problem: Pain Managment: Goal: General experience of comfort will improve 06/19/2019 1823 by Thom Chimes, RN Outcome: Progressing 06/19/2019 1822 by Thom Chimes, RN Outcome: Progressing   Problem: Safety: Goal: Ability to remain free from injury will improve 06/19/2019 1823 by Thom Chimes, RN Outcome: Progressing 06/19/2019 1822 by Thom Chimes, RN Outcome: Progressing   Problem: Skin Integrity: Goal: Risk for impaired skin integrity will decrease Outcome: Progressing

## 2019-06-19 NOTE — Progress Notes (Addendum)
FMTS Attending Daily Note: Terisa Starr, MD  Team Pager [phone] Pager [phone]  I have seen and examined this patient, reviewed their chart. I have discussed this patient with the resident. I agree with the resident's findings, assessment and care plan.  Agree with below. Stable for discharge.   Family Medicine Teaching Service Daily Progress Note Intern Pager: [phone]  Patient name: Chinemerem Aldaba. Medical record number: [id number] Date of birth: 06-09-1950 Age: 69 y.o. Gender: male  Primary Care Provider: Ralene Ok, MD Consultants: none Code Status: full  Pt Overview and Major Events to Date:  2/13 admitted for AMS 2/16 MRI with no acute process, EEG w/ e/o eleptogenicity and chronic cortical dysfunction 2/2 to underlying infarct  Assessment and Plan: Kenson Mola. is a 69 y.o. male presenting with AMS . PMH is significant for AMS, h/o CVA with right-sided weakness, HTN.  AMS Patient at cognitive baseline per daughter: only answers yes to questions at baseline.  Medically stable for discharge. Patient does not appear to be in distress. EEG showed evidence of epileptogenicity as well as cortical dysfunction in left frontotemporal likely secondary to underlying infarct without seizures. MRI without acute process. Per neurology, lamictal is adequate treatment for the epileptogenicity seen on EEG, no further tx necessary. - Continue lamictal. - Ionized calcium 5.3, no change from 2 weeks ago 5.2 - PT/OT - maintenance IV fluids - continue home calcium supplement  Possible leg pain- foot xray shows no acute bony abnormalities.  Right tib-fib x-ray without acute fracture. - tylenol PRN - recommend podiatry follow up for nail care  H/o CVA with residual right-sided hemiplegia- unchanged MRI without acute process, small vessel ischemia and old infarcts - PT/OT - restart atorvastatin 40 mg daily - continue DAPT: asa, plavix; to end after 3 months of treatment,  06/30/19.  HTN- Blood pressures overnight very high, systolic range over the last 24 hours 152-170. Patient hypertensive despite 4 agent regimen at max or near maximum dose.  Home meds: amlodipine 10 daily, hydralazine 75mg  TID, lisinopril 20mg  BID, metoprolol 25 daily. - continue home meds - start indapamide   DMII- home meds glipizide. hgb A1c 6.7 this month. CBG this am of 235 - Held home med glipizide, discontinue due to age and risk of hypoglycemia - Start metformin glucophage 500 mg daily  BPH with frequent UTIs. Urinalysis negative on presentation. Remove condom cath to reduce risk of CAUDIs - continue tamsulosin  FEN/GI:  Dysphagia 3 Prophylaxis: lovenox  Disposition: likely discharge to SNF  Subjective:  Patient at cognitive baseline, no distress, resting comfortably  Objective: Temp:  [97.5 F (36.4 C)-98.4 F (36.9 C)] 97.5 F (36.4 C) (02/17 0828) Pulse Rate:  [86-114] 101 (02/17 0828) Resp:  [18-20] 18 (02/17 0828) BP: (152-170)/(88-99) 152/91 (02/17 0828) SpO2:  [100 %] 100 % (02/17 0828) Physical Exam: General: Only answers yes to questions, does not appear to be in any distress Heart: Regular rate and rhythm with no murmurs appreciated Lungs: CTA bilaterally Abdomen: Bowel sounds present, no abdominal pain Skin: Warm and dry Extremities: no edema  Laboratory: Recent Labs  Lab 06/17/19 1321 06/18/19 0223 06/19/19 0758  WBC 5.7 5.1 7.0  HGB 13.9 13.1 15.5  HCT 42.8 39.6 46.6  PLT 214 205 253   Recent Labs  Lab 06/15/19 1945 06/16/19 0110 06/17/19 1321 06/18/19 0223 06/19/19 0758  NA 144  --  136 138 138  K 4.8  --  4.0 3.9 3.7  CL 110  --  102 103 99  CO2 23  --  25 26 26   BUN 13  --  17 12 10   CREATININE 0.85   < > 1.12 0.97 0.92  CALCIUM 8.5*  --  9.5 9.1 9.7  PROT 6.6  --   --   --   --   BILITOT 0.7  --   --   --   --   ALKPHOS 77  --   --   --   --   ALT 23  --   --   --   --   AST 24  --   --   --   --   GLUCOSE 150*  --  277*  174* 235*   < > = values in this interval not displayed.   Ionized calcium pending COVID negative  Imaging/Diagnostic Tests: MR BRAIN WO CONTRAST  Result Date: 06/17/2019 CLINICAL DATA:  Subacute neuro deficits. EXAM: MRI HEAD WITHOUT CONTRAST TECHNIQUE: Multiplanar, multiecho pulse sequences of the brain and surrounding structures were obtained without intravenous contrast. COMPARISON:  Head CT from 2 days ago.  Brain MRI 03/05/2019 FINDINGS: Brain: Chronic diffusion hyperintensity at the left corona radiata. There has been extensive remote left MCA and PCA distribution infarct with dense encephalomalacia and volume loss. The brain is globally atrophic and there is confluent FLAIR hyperintensity in the deep white matter. Superimposed Wallerian changes are seen descending the cortical spinal tracts on the left. Numerous remote micro hemorrhages with a chronic hypertensive pattern. Multiple remote lacunar infarcts at the level of the brainstem. There is hemosiderin staining along the prior cortical infarct. No acute infarct, acute hemorrhage, hydrocephalus, or collection. Vascular: Evidence of high-grade distal right V4 segment stenosis with altered flow void. There is cut off of the left distal M1 segment correlating with the acute infarct. The left ICA is small smaller than the right, likely underfilling. Skull and upper cervical spine: No evidence of marrow lesion Sinuses/Orbits: Negative IMPRESSION: 1. No acute finding. 2. Remote left MCA and PCA infarction. 3. Severe chronic small vessel ischemia. Electronically Signed   By: 06/19/2019 M.D.   On: 06/17/2019 11:57   DG Swallowing Func-Speech Pathology  Result Date: 06/18/2019 Objective Swallowing Evaluation: Type of Study: MBS-Modified Barium Swallow Study  Patient Details Name: Suyash Amory. MRN: 06/20/2019 Date of Birth: [birth year] Today's Date: 06/18/2019 Time: SLP Start Time (ACUTE ONLY): 0920 -SLP Stop Time (ACUTE ONLY): 0937 SLP Time  Calculation (min) (ACUTE ONLY): 17 min Past Medical History: Past Medical History: Diagnosis Date . Diabetes mellitus without complication (HCC)  . Hypertension  . Stroke [facility]  Past Surgical History: Past Surgical History: Procedure Laterality Date . IR PATIENT EVAL TECH 0-60 MINS  03/30/2018 . LOOP RECORDER INSERTION N/A 03/08/2019  Procedure: LOOP RECORDER INSERTION;  Surgeon: 04/01/2018, MD;  Location: [facility] INVASIVE CV LAB;  Service: Cardiovascular;  Laterality: N/A; . TEE WITHOUT CARDIOVERSION N/A 04/02/2018  Procedure: TRANSESOPHAGEAL ECHOCARDIOGRAM (TEE);  Surgeon: Regan Lemming, MD;  Location: [facility] ENDOSCOPY;  Service: Cardiovascular;  Laterality: N/A; HPI: [name]. is a 69 y.o. male presenting with AMS. PMH is significant for AMS, h/o CVA with right-sided weakness, HTN. Pt is know to ST department and the most recent BSE on 06/06/19 recommended Dysphagia 3 solids and thin liquids (no straws).  Head CT on 2/13 was negative for acute intracranial process.  MRI is pending.  Subjective: Pt was awake/alert Assessment / Plan / Recommendation CHL IP CLINICAL IMPRESSIONS 06/18/2019 Clinical Impression  Moderate oropharyngeal dysphagia comprised of impairments in bolus cohesion/control and timing/coordination/onset of swallow resulting in penetration/aspiration of thin/nectar. Bolus falling sublingually but able to manipulate and cup on tongue without significant delays. Unable to fully view but potential for significant pocketing on right side. Majority of liquids reached his pyriform sinuses and sat for up to 7 seconds at max. Barium in pyriform sinuses was aspirated once swallow triggered with pt softly throat clearing. Pharyngeal residue was less with honey due to increased coordination during swallow. Nectar thick residue was mild in valleculae and pyriforms and cleared when he was able to swallow with cues. Honey thick recommended to mitigate risk with nectar even further; continue Dys 3  texture, crush meds, no straws, full assist for strategies and check right oral cavity for residue. ST will continue to assist with texture safety and possible respiratory muscle strength training if pt able.  SLP Visit Diagnosis Dysphagia, oropharyngeal phase (R13.12) Attention and concentration deficit following -- Frontal lobe and executive function deficit following -- Impact on safety and function Moderate aspiration risk   CHL IP TREATMENT RECOMMENDATION 06/18/2019 Treatment Recommendations Therapy as outlined in treatment plan below   Prognosis 06/18/2019 Prognosis for Safe Diet Advancement (No Data) Barriers to Reach Goals Language deficits;Cognitive deficits Barriers/Prognosis Comment -- CHL IP DIET RECOMMENDATION 06/18/2019 SLP Diet Recommendations Dysphagia 3 (Mech soft) solids;Honey thick liquids Liquid Administration via Cup;No straw Medication Administration Crushed with puree Compensations Minimize environmental distractions;Slow rate;Small sips/bites;Lingual sweep for clearance of pocketing Postural Changes Remain semi-upright after after feeds/meals (Comment)   CHL IP OTHER RECOMMENDATIONS 06/18/2019 Recommended Consults -- Oral Care Recommendations Oral care BID Other Recommendations --   CHL IP FOLLOW UP RECOMMENDATIONS 06/18/2019 Follow up Recommendations (No Data)   CHL IP FREQUENCY AND DURATION 06/18/2019 Speech Therapy Frequency (ACUTE ONLY) min 2x/week Treatment Duration 2 weeks      CHL IP ORAL PHASE 06/18/2019 Oral Phase Impaired Oral - Pudding Teaspoon -- Oral - Pudding Cup -- Oral - Honey Teaspoon -- Oral - Honey Cup WFL Oral - Nectar Teaspoon -- Oral - Nectar Cup Decreased bolus cohesion Oral - Nectar Straw Decreased bolus cohesion Oral - Thin Teaspoon -- Oral - Thin Cup Decreased bolus cohesion Oral - Thin Straw -- Oral - Puree -- Oral - Mech Soft Delayed oral transit Oral - Regular -- Oral - Multi-Consistency -- Oral - Pill -- Oral Phase - Comment --  CHL IP PHARYNGEAL PHASE 06/18/2019  Pharyngeal Phase Impaired Pharyngeal- Pudding Teaspoon -- Pharyngeal -- Pharyngeal- Pudding Cup -- Pharyngeal -- Pharyngeal- Honey Teaspoon -- Pharyngeal -- Pharyngeal- Honey Cup Delayed swallow initiation-pyriform sinuses Pharyngeal -- Pharyngeal- Nectar Teaspoon -- Pharyngeal -- Pharyngeal- Nectar Cup Delayed swallow initiation-pyriform sinuses;Penetration/Aspiration during swallow;Pharyngeal residue - valleculae;Pharyngeal residue - pyriform Pharyngeal Material enters airway, remains ABOVE vocal cords and not ejected out Pharyngeal- Nectar Straw Penetration/Aspiration during swallow;Reduced pharyngeal peristalsis Pharyngeal Material enters airway, passes BELOW cords without attempt by patient to eject out (silent aspiration) Pharyngeal- Thin Teaspoon NT Pharyngeal -- Pharyngeal- Thin Cup Delayed swallow initiation-pyriform sinuses;Penetration/Aspiration during swallow;Pharyngeal residue - pyriform;Pharyngeal residue - valleculae Pharyngeal Material enters airway, passes BELOW cords and not ejected out despite cough attempt by patient Pharyngeal- Thin Straw NT Pharyngeal -- Pharyngeal- Puree -- Pharyngeal -- Pharyngeal- Mechanical Soft -- Pharyngeal -- Pharyngeal- Regular Pharyngeal residue - valleculae Pharyngeal -- Pharyngeal- Multi-consistency -- Pharyngeal -- Pharyngeal- Pill -- Pharyngeal -- Pharyngeal Comment --  CHL IP CERVICAL ESOPHAGEAL PHASE 06/18/2019 Cervical Esophageal Phase WFL Pudding Teaspoon -- Pudding Cup -- Honey Teaspoon --  Honey Cup -- Nectar Teaspoon -- Nectar Cup -- Nectar Straw -- Thin Teaspoon -- Thin Cup -- Thin Straw -- Puree -- Mechanical Soft -- Regular -- Multi-consistency -- Pill -- Cervical Esophageal Comment -- Houston Siren 06/18/2019, 10:23 AM Orbie Pyo Colvin Caroli.Ed Actor Pager [phone] Office [phone]              CUP [place]  Result Date: 06/18/2019 Carelink summary report received. Battery status OK. Normal device  function. No new symptom episodes, tachy episodes, brady, or pause episodes. No new AF episodes.  Trend data not available. Monthly summary reports and ROV/PRN.  DG Swallowing Func-Speech Pathology  Result Date: 06/18/2019 Objective Swallowing Evaluation: Type of Study: MBS-Modified Barium Swallow Study  Patient Details Name: Maxson Oddo. MRN: [id number] Date of Birth: 1950/09/23 Today's Date: 06/18/2019 Time: SLP Start Time (ACUTE ONLY): 0920 -SLP Stop Time (ACUTE ONLY): 0937 SLP Time Calculation (min) (ACUTE ONLY): 17 min Past Medical History: Past Medical History: Diagnosis Date . Diabetes mellitus without complication ([place])  . Hypertension  . Stroke [facility])  Past Surgical History: Past Surgical History: Procedure Laterality Date . IR PATIENT EVAL TECH 0-60 MINS  03/30/2018 . LOOP RECORDER INSERTION N/A 03/08/2019  Procedure: LOOP RECORDER INSERTION;  Surgeon: Constance Haw, MD;  Location: [place] CV LAB;  Service: Cardiovascular;  Laterality: N/A; . TEE WITHOUT CARDIOVERSION N/A 04/02/2018  Procedure: TRANSESOPHAGEAL ECHOCARDIOGRAM (TEE);  Surgeon: Skeet Latch, MD;  Location: [facility] ENDOSCOPY;  Service: Cardiovascular;  Laterality: N/A; HPI: [name] is a 69 y.o. male presenting with AMS. PMH is significant for AMS, h/o CVA with right-sided weakness, HTN. Pt is know to ST department and the most recent BSE on 06/06/19 recommended Dysphagia 3 solids and thin liquids (no straws).  Head CT on 2/13 was negative for acute intracranial process.  MRI is pending.  Subjective: Pt was awake/alert Assessment / Plan / Recommendation CHL IP CLINICAL IMPRESSIONS 06/18/2019 Clinical Impression Moderate oropharyngeal dysphagia comprised of impairments in bolus cohesion/control and timing/coordination/onset of swallow resulting in penetration/aspiration of thin/nectar. Bolus falling sublingually but able to manipulate and cup on tongue without significant delays. Unable to fully view but potential for  significant pocketing on right side. Majority of liquids reached his pyriform sinuses and sat for up to 7 seconds at max. Barium in pyriform sinuses was aspirated once swallow triggered with pt softly throat clearing. Pharyngeal residue was less with honey due to increased coordination during swallow. Nectar thick residue was mild in valleculae and pyriforms and cleared when he was able to swallow with cues. Honey thick recommended to mitigate risk with nectar even further; continue Dys 3 texture, crush meds, no straws, full assist for strategies and check right oral cavity for residue. ST will continue to assist with texture safety and possible respiratory muscle strength training if pt able.  SLP Visit Diagnosis Dysphagia, oropharyngeal phase (R13.12) Attention and concentration deficit following -- Frontal lobe and executive function deficit following -- Impact on safety and function Moderate aspiration risk   CHL IP TREATMENT RECOMMENDATION 06/18/2019 Treatment Recommendations Therapy as outlined in treatment plan below   Prognosis 06/18/2019 Prognosis for Safe Diet Advancement (No Data) Barriers to Reach Goals Language deficits;Cognitive deficits Barriers/Prognosis Comment -- CHL IP DIET RECOMMENDATION 06/18/2019 SLP Diet Recommendations Dysphagia 3 (Mech soft) solids;Honey thick liquids Liquid Administration via Cup;No straw Medication Administration Crushed with puree Compensations Minimize environmental distractions;Slow rate;Small sips/bites;Lingual sweep for clearance of pocketing Postural Changes Remain semi-upright after  after feeds/meals (Comment)   CHL IP OTHER RECOMMENDATIONS 06/18/2019 Recommended Consults -- Oral Care Recommendations Oral care BID Other Recommendations --   CHL IP FOLLOW UP RECOMMENDATIONS 06/18/2019 Follow up Recommendations (No Data)   CHL IP FREQUENCY AND DURATION 06/18/2019 Speech Therapy Frequency (ACUTE ONLY) min 2x/week Treatment Duration 2 weeks      CHL IP ORAL PHASE 06/18/2019  Oral Phase Impaired Oral - Pudding Teaspoon -- Oral - Pudding Cup -- Oral - Honey Teaspoon -- Oral - Honey Cup WFL Oral - Nectar Teaspoon -- Oral - Nectar Cup Decreased bolus cohesion Oral - Nectar Straw Decreased bolus cohesion Oral - Thin Teaspoon -- Oral - Thin Cup Decreased bolus cohesion Oral - Thin Straw -- Oral - Puree -- Oral - Mech Soft Delayed oral transit Oral - Regular -- Oral - Multi-Consistency -- Oral - Pill -- Oral Phase - Comment --  CHL IP PHARYNGEAL PHASE 06/18/2019 Pharyngeal Phase Impaired Pharyngeal- Pudding Teaspoon -- Pharyngeal -- Pharyngeal- Pudding Cup -- Pharyngeal -- Pharyngeal- Honey Teaspoon -- Pharyngeal -- Pharyngeal- Honey Cup Delayed swallow initiation-pyriform sinuses Pharyngeal -- Pharyngeal- Nectar Teaspoon -- Pharyngeal -- Pharyngeal- Nectar Cup Delayed swallow initiation-pyriform sinuses;Penetration/Aspiration during swallow;Pharyngeal residue - valleculae;Pharyngeal residue - pyriform Pharyngeal Material enters airway, remains ABOVE vocal cords and not ejected out Pharyngeal- Nectar Straw Penetration/Aspiration during swallow;Reduced pharyngeal peristalsis Pharyngeal Material enters airway, passes BELOW cords without attempt by patient to eject out (silent aspiration) Pharyngeal- Thin Teaspoon NT Pharyngeal -- Pharyngeal- Thin Cup Delayed swallow initiation-pyriform sinuses;Penetration/Aspiration during swallow;Pharyngeal residue - pyriform;Pharyngeal residue - valleculae Pharyngeal Material enters airway, passes BELOW cords and not ejected out despite cough attempt by patient Pharyngeal- Thin Straw NT Pharyngeal -- Pharyngeal- Puree -- Pharyngeal -- Pharyngeal- Mechanical Soft -- Pharyngeal -- Pharyngeal- Regular Pharyngeal residue - valleculae Pharyngeal -- Pharyngeal- Multi-consistency -- Pharyngeal -- Pharyngeal- Pill -- Pharyngeal -- Pharyngeal Comment --  CHL IP CERVICAL ESOPHAGEAL PHASE 06/18/2019 Cervical Esophageal Phase WFL Pudding Teaspoon -- Pudding Cup -- Honey  Teaspoon -- Honey Cup -- Nectar Teaspoon -- Nectar Cup -- Nectar Straw -- Thin Teaspoon -- Thin Cup -- Thin Straw -- Puree -- Mechanical Soft -- Regular -- Multi-consistency -- Pill -- Cervical Esophageal Comment -- [name] 06/18/2019, 10:23 AM Breck Coons Lonell Face.Ed Sports administrator Pager [phone] Office [phone]               Shirlean Mylar, MD 06/19/2019, 9:28 AM PGY-1, [facility] FPTS Intern pager: [phone], text pages welcome

## 2019-06-19 NOTE — Progress Notes (Signed)
I concur with the assessment and med administration by Danielle Rankin, SN [facility].

## 2019-06-19 NOTE — Progress Notes (Signed)
Discussed 2/15 EEG with Dr. Kirkpatrick, neurology, regarding epileptogenicity and anti-epileptic treatment. Patient with history of seizures, currently on lamictal, previously on lamictal and keppra, but keppra d/c'd due to agitation. Dr. Kirkpatrick recommended no change as epileptogenicity predisposes patient to seizures, but is adequately treated with lamictal.  Douglas Frigon, MD [facility], PGY-1  

## 2019-06-19 NOTE — Progress Notes (Signed)
Physical Therapy Treatment Patient Details Name: Douglas [name]. MRN: [id number] DOB: 1950/10/31 Today's Date: 06/19/2019    History of Present Illness 69 y.o. male presenting with AMS. PMHx is significant for HTN, DM, h/o CVA with right-sided weakness. Of note pt with recent admit and d/c home on 2/7 for AMS. Head CT on 2/13 was negative for acute intracranial process.  MRI only remote findings    PT Comments    Pt performed limited session due to agitation.  He was unable to come to standing this session.  Continue to recommend HHPT as family is insistent to take patient home.     Follow Up Recommendations  Home health PT;Supervision for mobility/OOB;Supervision/Assistance - 24 hour     Equipment Recommendations  None recommended by PT    Recommendations for Other Services       Precautions / Restrictions Precautions Precautions: Fall Precaution Comments: high tone in RUE and RLE Restrictions Weight Bearing Restrictions: No    Mobility  Bed Mobility Overal bed mobility: Needs Assistance Bed Mobility: Supine to Sit     Supine to sit: Total assist;+2 for physical assistance Sit to supine: Total assist;+2 for physical assistance   General bed mobility comments: He continues to fight against facilitation to come to sitting.  He is fdifficult to under stand due to expressive aphasia but more resistive to mobilize this session.  He was able to come to sitting edge of bed but did not progress to standing.  Pt positioned in chair position post session to improve posture and respiratory function.  He list to the L.  Transfers Overall transfer level: (unable)                  Ambulation/Gait                 Stairs             Wheelchair Mobility    Modified Rankin (Stroke Patients Only)       Balance Overall balance assessment: Needs assistance Sitting-balance support: Single extremity supported;Feet supported Sitting balance-Leahy Scale:  Poor       Standing balance-Leahy Scale: Poor                              Cognition Arousal/Alertness: Awake/alert Behavior During Therapy: Agitated Overall Cognitive Status: Difficult to assess                                 General Comments: Pt continues to  verbalize yes and hey.  Very agitated this session with constant redirection.  He would grip to railing and not let go and pushes to L side.      Exercises      General Comments        Pertinent Vitals/Pain Pain Assessment: No/denies pain Faces Pain Scale: Hurts a little bit Pain Location: pointing to L forearm, RN removing IV Pain Descriptors / Indicators: Guarding Pain Intervention(s): Monitored during session;Repositioned    Home Living                      Prior Function            PT Goals (current goals can now be found in the care plan section) Acute Rehab PT Goals Patient Stated Goal: pt only states "yes" to any question Potential to Achieve Goals: Fair  Progress towards PT goals: Progressing toward goals    Frequency    Min 3X/week      PT Plan Current plan remains appropriate    Co-evaluation              AM-PAC PT "6 Clicks" Mobility   Outcome Measure  Help needed turning from your back to your side while in a flat bed without using bedrails?: Total Help needed moving from lying on your back to sitting on the side of a flat bed without using bedrails?: Total Help needed moving to and from a bed to a chair (including a wheelchair)?: Total Help needed standing up from a chair using your arms (e.g., wheelchair or bedside chair)?: Total Help needed to walk in hospital room?: Total Help needed climbing 3-5 steps with a railing? : Total 6 Click Score: 6    End of Session Equipment Utilized During Treatment: Gait belt Activity Tolerance: Patient tolerated treatment well Patient left: in bed;with call bell/phone within reach;with bed alarm  set Nurse Communication: Mobility status;Need for lift equipment;Other (comment) PT Visit Diagnosis: Other abnormalities of gait and mobility (R26.89);Unsteadiness on feet (R26.81);Muscle weakness (generalized) (M62.81);Other symptoms and signs involving the nervous system (R29.898);Hemiplegia and hemiparesis Hemiplegia - Right/Left: Right Hemiplegia - dominant/non-dominant: Dominant     Time: 0258-5277 PT Time Calculation (min) (ACUTE ONLY): 26 min  Charges:  $Therapeutic Activity: 23-37 mins                     Erasmo Leventhal , PTA Acute Rehabilitation Services Pager [phone] Office [phone]     Magan Winnett Eli Hose 06/19/2019, 5:44 PM

## 2019-06-19 NOTE — Progress Notes (Signed)
I concur with the assessment and med administration facilitated by Danielle Rankin, SN [facility].

## 2019-06-19 NOTE — Progress Notes (Signed)
  Speech Language Pathology Treatment: Dysphagia  Patient Details Name: Douglas [name]. MRN: [id number] DOB: July 07, 1950 Today's Date: 06/19/2019 Time: 7741-4239 SLP Time Calculation (min) (ACUTE ONLY): 30 min  Assessment / Plan / Recommendation Clinical Impression  Skilled treatment targeted dysphagia goals. SLP facilitated session by providing skilled observation of pt consuming dysphagia 2 lunch tray with honey thick liquids. Pt required Moderate verbal and tactile cues to initiate self-feeding. Pt was good oral clearing and no overt s/s of aspiration when consuming honey thick liquids via cup. ST to continue to follow.     HPI HPI: Douglas [name]. is a 69 y.o. male presenting with AMS. PMH is significant for AMS, h/o CVA with right-sided weakness, HTN. Pt is know to ST department and the most recent BSE on 06/06/19 recommended Dysphagia 3 solids and thin liquids (no straws).  Head CT on 2/13 was negative for acute intracranial process.  MRI is pending.       SLP Plan  Continue with current plan of care       Recommendations  Diet recommendations: Dysphagia 2 (fine chop);Honey-thick liquid Liquids provided via: Cup;No straw Medication Administration: Whole meds with puree Supervision: Full supervision/cueing for compensatory strategies Compensations: Minimize environmental distractions;Slow rate;Small sips/bites;Lingual sweep for clearance of pocketing Postural Changes and/or Swallow Maneuvers: Seated upright 90 degrees                General recommendations: Rehab consult Oral Care Recommendations: Oral care BID;Staff/trained caregiver to provide oral care Follow up Recommendations: Inpatient Rehab SLP Visit Diagnosis: Dysphagia, oropharyngeal phase (R13.12) Plan: Continue with current plan of care       GO                Peng Thorstenson 06/19/2019, 1:38 PM

## 2019-06-20 DIAGNOSIS — I6990 Unspecified sequelae of unspecified cerebrovascular disease: Secondary | ICD-10-CM

## 2019-06-20 LAB — BASIC METABOLIC PANEL
Anion gap: 9 (ref 5–15)
BUN: 12 mg/dL (ref 8–23)
CO2: 28 mmol/L (ref 22–32)
Calcium: 9.3 mg/dL (ref 8.9–10.3)
Chloride: 100 mmol/L (ref 98–111)
Creatinine, Ser: 0.98 mg/dL (ref 0.61–1.24)
GFR calc Af Amer: >60 mL/min (ref >60)
GFR calc non Af Amer: >60 mL/min (ref >60)
Glucose, Bld: 218 mg/dL — ABNORMAL HIGH (ref 70–99)
Potassium: 3.8 mmol/L (ref 3.5–5.1)
Sodium: 137 mmol/L (ref 135–145)

## 2019-06-20 LAB — GLUCOSE, CAPILLARY
Glucose-Capillary: 269 mg/dL — ABNORMAL HIGH (ref 70–99)
Glucose-Capillary: 276 mg/dL — ABNORMAL HIGH (ref 70–99)
Glucose-Capillary: 126 mg/dL — ABNORMAL HIGH (ref 70–99)
Glucose-Capillary: 270 mg/dL — ABNORMAL HIGH (ref 70–99)

## 2019-06-20 MED ORDER — HALOPERIDOL 0.5 MG PO TABS: 1.0000 mg | ORAL_TABLET | Freq: Three times a day (TID) | ORAL | Status: DC | PRN

## 2019-06-20 MED ORDER — HALOPERIDOL LACTATE 5 MG/ML IJ SOLN: 1.0000 mg | Freq: Four times a day (QID) | INTRAMUSCULAR | Status: DC | PRN

## 2019-06-20 MED ORDER — METFORMIN HCL 500 MG PO TABS
500.0000 mg | ORAL_TABLET | Freq: Two times a day (BID) | ORAL | Status: DC
  Administered 2019-06-20 – 2019-06-21 (×2): 500 mg via ORAL
  Filled 2019-06-20 (×2): qty 1

## 2019-06-20 MED ORDER — CARVEDILOL 12.5 MG PO TABS
12.5000 mg | ORAL_TABLET | Freq: Two times a day (BID) | ORAL | Status: DC
  Administered 2019-06-20 – 2019-06-21 (×3): 12.5 mg via ORAL
  Filled 2019-06-20 (×3): qty 1

## 2019-06-20 NOTE — Progress Notes (Signed)
Results for MACALLISTER, ASHMEAD (MRN [id number]) as of 06/20/2019 14:01  Ref. Range 06/19/2019 16:26 06/19/2019 22:05 06/20/2019 06:36 06/20/2019 11:58  Glucose-Capillary Latest Ref Range: 70 - 99 mg/dL 314 (H) 970 (H) 263 (H) 276 (H)  CBGs continue to be greater than 180 mg/dl.  May want to consider adding Levemir 8 units at HS if blood sugars continue to be elevated.   Smith Mince RN BSN CDE Diabetes Coordinator Pager: [phone]  8am-5pm

## 2019-06-20 NOTE — TOC Progression Note (Signed)
Transition of Care (TOC) - Progression Note    Patient Details  Name: Douglas [name]. MRN: [id number] Date of Birth: 20-Dec-1950  Transition of Care [facility]) CM/SW Contact  Baldemar Lenis, [place] Phone Number: 06/20/2019, 5:06 PM  Clinical Narrative:   CSW following for discharge plan. CSW coordinated with MD and daughter today to discuss plan, daughter would still like patient to come home at discharge and has been able to arrange a caregiver to support her tomorrow, barrier for today is the weather outside. MD aware. CSW to follow.    Expected Discharge Plan: Skilled Nursing Facility Barriers to Discharge: Continued Medical Work up  Expected Discharge Plan and Services Expected Discharge Plan: Skilled Nursing Facility     Post Acute Care Choice: Skilled Nursing Facility Living arrangements for the past 2 months: Single Family Home                                       Social Determinants of Health (SDOH) Interventions    Readmission Risk Interventions No flowsheet data found.

## 2019-06-20 NOTE — Progress Notes (Signed)
Discussed antiplatelet therapy for this patient after he finishes DAPT at the end of this month with Dr. Pearlean Brownie, neurology.  Since patient had failed aspirin 325 mg therapy prior to the most recent stroke in November 2020, he recommends patient remain on Plavix 75 mg after February 28.  Aspirin 325 mg can be discontinued on February 28.  Shirlean Mylar, MD [facility] Family Medicine Residency, PGY-1

## 2019-06-20 NOTE — TOC Progression Note (Signed)
Transition of Care (TOC) - Progression Note    Patient Details  Name: Douglas [name]. MRN: [id number] Date of Birth: [birth year]  Transition of Care [facility]) CM/SW Contact  Baldemar Lenis, [place] Phone Number: 06/20/2019, 5:03 PM  Clinical Narrative:   CSW following for discharge plan. CSW spoke with patient's daughter about bed offers, and she responded back that she would be interested in hearing back from [facility] in [place]. CSW reached out to [facility] Admissions and they do not feel that they are able to meet the patient's needs at this time. Daughter then asked about [facility] and [facility], and whether they would allow her caregiver to come in and assist the patient. CSW contacted them to find out, and [facility] would allow the caregiver after a 2 week quarantine. CSW provided information to daughter, and she is now thinking about bringing the patient home but waiting to hear on when her caregiver could resume services.  CSW received update from [facility] that they could waive the quarantine for the caregiver under the following circumstances: if she was fully vaccinated, would agree to be tested on the SNF schedule, and abide by their PPE guidelines. CSW provided update to daughter, but she did not think that the caregiver was vaccinated and would try to confirm. Daughter still wanting to take patient home at this time, especially if caregiver cannot be allowed to assist in the patient's care. CSW to follow back up with the daughter tomorrow.    Expected Discharge Plan: Skilled Nursing Facility Barriers to Discharge: Continued Medical Work up  Expected Discharge Plan and Services Expected Discharge Plan: Skilled Nursing Facility     Post Acute Care Choice: Skilled Nursing Facility Living arrangements for the past 2 months: Single Family Home                                       Social Determinants of Health (SDOH)  Interventions    Readmission Risk Interventions No flowsheet data found.

## 2019-06-20 NOTE — Progress Notes (Signed)
Telemetry order d/c. Dana Allan MD called to confirm this order. Will d/c tele per MD.

## 2019-06-20 NOTE — Progress Notes (Signed)
Family Medicine Teaching Service Daily Progress Note Intern Pager: [phone]  Patient name: Douglas [name]. Medical record number: [id number] Date of birth: [birth year] Age: 69 y.o. Gender: male  Primary Care Provider: Ralene Ok, MD Consultants: none Code Status: full  Pt Overview and Major Events to Date:  2/13 admitted for AMS 2/16 MRI with no acute process, EEG w/ e/o eleptogenicity and chronic cortical dysfunction 2/2 to underlying infarct  Assessment and Plan: [name]. is a 69 y.o. male presenting with AMS . PMH is significant for AMS, h/o CVA with right-sided weakness, HTN.  AMS Patient at cognitive baseline per daughter: only answers yes to questions at baseline.  Medically stable for discharge. Patient does not appear to be in distress. EEG showed evidence of epileptogenicity as well as cortical dysfunction in left frontotemporal likely secondary to underlying infarct without seizures. MRI without acute process. Per neurology, lamictal is adequate treatment for the epileptogenicity seen on EEG, no further tx necessary. - Continue lamictal. - Ionized calcium 5.3, no change from 2 weeks ago 5.2 - PT/OT - maintenance IV fluids - continue home calcium supplement  Possible leg pain- foot xray shows no acute bony abnormalities.  Right tib-fib x-ray without acute fracture. - tylenol PRN - recommend podiatry follow up for nail care  H/o CVA with residual right-sided hemiplegia- unchanged MRI without acute process, small vessel ischemia and old infarcts - PT/OT - restart atorvastatin 40 mg daily - continue DAPT: asa, plavix; to end after 3 months of treatment, 06/30/19.  HTN- Blood pressures overnight remain high, but improved from yesterday. Systolic range over the last 24 hours 135-169, DBP range from 79-93. Patient hypertensive despite 4 agent regimen at max or near maximum dose.  Home meds: amlodipine 10 daily, hydralazine 75mg  TID, lisinopril 20mg  BID,  metoprolol 50 daily. Will stop metoprolol and start coreg 12.5 mg BID for better blood pressure control. - continue amlodipine, hydralazine, lisinopril - Continue indapamide 2.5 mg qhs - Discontinue metoprolol - Start coreg 12.5 mg BID  DMII- hgb A1c 6.7 this month. CBG this am of 218, aspart 11U yesterday. Held home med glipizide, levemir discontinue due to age and risk of hypoglycemia - Increase to metformin glucophage 500 mg BID - Continue to monitor  BPH with frequent UTIs. Urinalysis negative on presentation. Remove condom cath to reduce risk of CAUDIs - continue tamsulosin  FEN/GI:  Dysphagia 3 Prophylaxis: lovenox  Disposition: likely discharge to SNF  Subjective:  Patient at cognitive baseline, no distress, resting comfortably  Objective: Temp:  [97.4 F (36.3 C)-98.9 F (37.2 C)] 98.3 F (36.8 C) (02/18 0055) Pulse Rate:  [78-101] 93 (02/18 0055) Resp:  [18-19] 18 (02/18 0055) BP: (135-169)/(79-93) 169/93 (02/18 0055) SpO2:  [100 %] 100 % (02/18 0055) Physical Exam: General: Only answers yes to questions, does not appear to be in any distress Heart: Regular rate and rhythm with no murmurs appreciated Lungs: CTA bilaterally Abdomen: Bowel sounds present, no abdominal pain Skin: Warm and dry Extremities: no edema  Laboratory: Recent Labs  Lab 06/17/19 1321 06/18/19 0223 06/19/19 0758  WBC 5.7 5.1 7.0  HGB 13.9 13.1 15.5  HCT 42.8 39.6 46.6  PLT 214 205 253   Recent Labs  Lab 06/15/19 1945 06/16/19 0110 06/18/19 0223 06/19/19 0758 06/20/19 0239  NA 144   < > 138 138 137  K 4.8   < > 3.9 3.7 3.8  CL 110   < > 103 99 100  CO2 23   < >  26 26 28   BUN 13   < > 12 10 12   CREATININE 0.85   < > 0.97 0.92 0.98  CALCIUM 8.5*   < > 9.1 9.7 9.3  PROT 6.6  --   --   --   --   BILITOT 0.7  --   --   --   --   ALKPHOS 77  --   --   --   --   ALT 23  --   --   --   --   AST 24  --   --   --   --   GLUCOSE 150*   < > 174* 235* 218*   < > = values in this  interval not displayed.   Ionized calcium pending COVID negative  Imaging/Diagnostic Tests: DG Swallowing Func-Speech Pathology  Result Date: 06/18/2019 Objective Swallowing Evaluation: Type of Study: MBS-Modified Barium Swallow Study  Patient Details Name: Douglas [name]. MRN: [id number] Date of Birth: 12/18/1950 Today's Date: 06/18/2019 Time: SLP Start Time (ACUTE ONLY): 0920 -SLP Stop Time (ACUTE ONLY): 0937 SLP Time Calculation (min) (ACUTE ONLY): 17 min Past Medical History: Past Medical History: Diagnosis Date . Diabetes mellitus without complication ([place])  . Hypertension  . Stroke [facility])  Past Surgical History: Past Surgical History: Procedure Laterality Date . IR PATIENT EVAL TECH 0-60 MINS  03/30/2018 . LOOP RECORDER INSERTION N/A 03/08/2019  Procedure: LOOP RECORDER INSERTION;  Surgeon: Constance Haw, MD;  Location: [facility];  Service: Cardiovascular;  Laterality: N/A; . TEE WITHOUT CARDIOVERSION N/A 04/02/2018  Procedure: TRANSESOPHAGEAL ECHOCARDIOGRAM (TEE);  Surgeon: Skeet Latch, MD;  Location: [facility] ENDOSCOPY;  Service: Cardiovascular;  Laterality: N/A; HPI: [name] is a 69 y.o. male presenting with AMS. PMH is significant for AMS, h/o CVA with right-sided weakness, HTN. Pt is know to ST department and the most recent BSE on 06/06/19 recommended Dysphagia 3 solids and thin liquids (no straws).  Head CT on 2/13 was negative for acute intracranial process.  MRI is pending.  Subjective: Pt was awake/alert Assessment / Plan / Recommendation CHL IP CLINICAL IMPRESSIONS 06/18/2019 Clinical Impression Moderate oropharyngeal dysphagia comprised of impairments in bolus cohesion/control and timing/coordination/onset of swallow resulting in penetration/aspiration of thin/nectar. Bolus falling sublingually but able to manipulate and cup on tongue without significant delays. Unable to fully view but potential for significant pocketing on right side. Majority of liquids reached  his pyriform sinuses and sat for up to 7 seconds at max. Barium in pyriform sinuses was aspirated once swallow triggered with pt softly throat clearing. Pharyngeal residue was less with honey due to increased coordination during swallow. Nectar thick residue was mild in valleculae and pyriforms and cleared when he was able to swallow with cues. Honey thick recommended to mitigate risk with nectar even further; continue Dys 3 texture, crush meds, no straws, full assist for strategies and check right oral cavity for residue. ST will continue to assist with texture safety and possible respiratory muscle strength training if pt able.  SLP Visit Diagnosis Dysphagia, oropharyngeal phase (R13.12) Attention and concentration deficit following -- Frontal lobe and executive function deficit following -- Impact on safety and function Moderate aspiration risk   CHL IP TREATMENT RECOMMENDATION 06/18/2019 Treatment Recommendations Therapy as outlined in treatment plan below   Prognosis 06/18/2019 Prognosis for Safe Diet Advancement (No Data) Barriers to Reach Goals Language deficits;Cognitive deficits Barriers/Prognosis Comment -- CHL IP DIET RECOMMENDATION 06/18/2019 SLP Diet Recommendations Dysphagia 3 (Mech soft)  solids;Honey thick liquids Liquid Administration via Cup;No straw Medication Administration Crushed with puree Compensations Minimize environmental distractions;Slow rate;Small sips/bites;Lingual sweep for clearance of pocketing Postural Changes Remain semi-upright after after feeds/meals (Comment)   CHL IP OTHER RECOMMENDATIONS 06/18/2019 Recommended Consults -- Oral Care Recommendations Oral care BID Other Recommendations --   CHL IP FOLLOW UP RECOMMENDATIONS 06/18/2019 Follow up Recommendations (No Data)   CHL IP FREQUENCY AND DURATION 06/18/2019 Speech Therapy Frequency (ACUTE ONLY) min 2x/week Treatment Duration 2 weeks      CHL IP ORAL PHASE 06/18/2019 Oral Phase Impaired Oral - Pudding Teaspoon -- Oral - Pudding Cup  -- Oral - Honey Teaspoon -- Oral - Honey Cup WFL Oral - Nectar Teaspoon -- Oral - Nectar Cup Decreased bolus cohesion Oral - Nectar Straw Decreased bolus cohesion Oral - Thin Teaspoon -- Oral - Thin Cup Decreased bolus cohesion Oral - Thin Straw -- Oral - Puree -- Oral - Mech Soft Delayed oral transit Oral - Regular -- Oral - Multi-Consistency -- Oral - Pill -- Oral Phase - Comment --  CHL IP PHARYNGEAL PHASE 06/18/2019 Pharyngeal Phase Impaired Pharyngeal- Pudding Teaspoon -- Pharyngeal -- Pharyngeal- Pudding Cup -- Pharyngeal -- Pharyngeal- Honey Teaspoon -- Pharyngeal -- Pharyngeal- Honey Cup Delayed swallow initiation-pyriform sinuses Pharyngeal -- Pharyngeal- Nectar Teaspoon -- Pharyngeal -- Pharyngeal- Nectar Cup Delayed swallow initiation-pyriform sinuses;Penetration/Aspiration during swallow;Pharyngeal residue - valleculae;Pharyngeal residue - pyriform Pharyngeal Material enters airway, remains ABOVE vocal cords and not ejected out Pharyngeal- Nectar Straw Penetration/Aspiration during swallow;Reduced pharyngeal peristalsis Pharyngeal Material enters airway, passes BELOW cords without attempt by patient to eject out (silent aspiration) Pharyngeal- Thin Teaspoon NT Pharyngeal -- Pharyngeal- Thin Cup Delayed swallow initiation-pyriform sinuses;Penetration/Aspiration during swallow;Pharyngeal residue - pyriform;Pharyngeal residue - valleculae Pharyngeal Material enters airway, passes BELOW cords and not ejected out despite cough attempt by patient Pharyngeal- Thin Straw NT Pharyngeal -- Pharyngeal- Puree -- Pharyngeal -- Pharyngeal- Mechanical Soft -- Pharyngeal -- Pharyngeal- Regular Pharyngeal residue - valleculae Pharyngeal -- Pharyngeal- Multi-consistency -- Pharyngeal -- Pharyngeal- Pill -- Pharyngeal -- Pharyngeal Comment --  CHL IP CERVICAL ESOPHAGEAL PHASE 06/18/2019 Cervical Esophageal Phase WFL Pudding Teaspoon -- Pudding Cup -- Honey Teaspoon -- Honey Cup -- Nectar Teaspoon -- Nectar Cup -- Nectar  Straw -- Thin Teaspoon -- Thin Cup -- Thin Straw -- Puree -- Mechanical Soft -- Regular -- Multi-consistency -- Pill -- Cervical Esophageal Comment -- [name] 06/18/2019, 10:23 AM Breck Coons [employer] [employer] administrator Pager [phone] Office [phone]              No results found.  Shirlean Mylar, MD 06/20/2019, 6:35 AM PGY-1, [facility] Family Medicine FPTS Intern pager: [phone], text pages welcome

## 2019-06-21 LAB — GLUCOSE, CAPILLARY
Glucose-Capillary: 300 mg/dL — ABNORMAL HIGH (ref 70–99)
Glucose-Capillary: 302 mg/dL — ABNORMAL HIGH (ref 70–99)
Glucose-Capillary: 243 mg/dL — ABNORMAL HIGH (ref 70–99)
Glucose-Capillary: 225 mg/dL — ABNORMAL HIGH (ref 70–99)

## 2019-06-21 MED ORDER — PANTOPRAZOLE SODIUM 40 MG PO TBEC: 40.0000 mg | DELAYED_RELEASE_TABLET | Freq: Every day | ORAL | 0 refills | Status: DC

## 2019-06-21 MED ORDER — METFORMIN HCL 500 MG PO TABS
1000.0000 mg | ORAL_TABLET | Freq: Two times a day (BID) | ORAL | Status: DC
  Administered 2019-06-21: 1000 mg via ORAL
  Filled 2019-06-21: qty 2

## 2019-06-21 MED ORDER — ASCORBIC ACID 500 MG PO TABS: 500.0000 mg | ORAL_TABLET | Freq: Every day | ORAL | 0 refills | Status: DC | PRN

## 2019-06-21 MED ORDER — LISINOPRIL 20 MG PO TABS: 20.0000 mg | ORAL_TABLET | Freq: Two times a day (BID) | ORAL | 0 refills | Status: DC

## 2019-06-21 MED ORDER — BACLOFEN 10 MG PO TABS: 20.0000 mg | ORAL_TABLET | Freq: Two times a day (BID) | ORAL | 0 refills | Status: DC

## 2019-06-21 MED ORDER — ASPIRIN 325 MG PO TBEC: 325.0000 mg | DELAYED_RELEASE_TABLET | Freq: Every day | ORAL | 0 refills | Status: DC

## 2019-06-21 MED ORDER — TAMSULOSIN HCL 0.4 MG PO CAPS: 0.4000 mg | ORAL_CAPSULE | Freq: Every day | ORAL | 0 refills | Status: DC

## 2019-06-21 MED ORDER — ATORVASTATIN CALCIUM 40 MG PO TABS: 40.0000 mg | ORAL_TABLET | Freq: Every day | ORAL | 0 refills | Status: DC

## 2019-06-21 MED ORDER — CALCIUM CITRATE 950 (200 CA) MG PO TABS: 500.0000 mg | ORAL_TABLET | Freq: Every day | ORAL | 0 refills | Status: DC

## 2019-06-21 MED ORDER — LAMOTRIGINE 100 MG PO TABS: 100.0000 mg | ORAL_TABLET | Freq: Two times a day (BID) | ORAL | 0 refills | Status: DC

## 2019-06-21 MED ORDER — CLOPIDOGREL BISULFATE 75 MG PO TABS: 75.0000 mg | ORAL_TABLET | Freq: Every day | ORAL | 0 refills | Status: DC

## 2019-06-21 MED ORDER — METFORMIN HCL 1000 MG PO TABS: 1000.0000 mg | ORAL_TABLET | Freq: Two times a day (BID) | ORAL | 0 refills | Status: DC

## 2019-06-21 MED ORDER — INDAPAMIDE 1.25 MG PO TABS: 1.2500 mg | ORAL_TABLET | Freq: Every day | ORAL | 0 refills | Status: DC

## 2019-06-21 MED ORDER — CARVEDILOL 12.5 MG PO TABS: 12.5000 mg | ORAL_TABLET | Freq: Two times a day (BID) | ORAL | 0 refills | Status: DC

## 2019-06-21 MED ORDER — AMLODIPINE BESYLATE 10 MG PO TABS: 10.0000 mg | ORAL_TABLET | Freq: Every day | ORAL | 0 refills | Status: DC

## 2019-06-21 MED ORDER — VITAMIN D3 25 MCG PO TABS: 500.0000 [IU] | ORAL_TABLET | Freq: Every day | ORAL | 0 refills | Status: DC

## 2019-06-21 NOTE — Progress Notes (Signed)
Physical Therapy Treatment Patient Details Name: Douglas [name]. MRN: [id number] DOB: [birth year] Today's Date: 06/21/2019    History of Present Illness 69 y.o. male presenting with AMS. PMHx is significant for HTN, DM, h/o CVA with right-sided weakness. Of note pt with recent admit and d/c home on 2/7 for AMS. Head CT on 2/13 was negative for acute intracranial process.  MRI only remote findings    PT Comments    Pt supine in bed and lethargic on arrival with intermittent waking moments.  Pt continues to benefit from skilled rehab in a post acute setting but daughter adamant for patient to return home.  Pt limited this session but he was able to remove cloth from his face, put on his hat and gesture to use the urinal.    Follow Up Recommendations  Home health PT;Supervision for mobility/OOB;Supervision/Assistance - 24 hour(Daughter refused SNF and wants to take patient home.)     Equipment Recommendations  None recommended by PT    Recommendations for Other Services       Precautions / Restrictions Precautions Precautions: Fall Precaution Comments: high tone in RUE and RLE Restrictions Weight Bearing Restrictions: No    Mobility  Bed Mobility                  Transfers                    Ambulation/Gait                 Stairs             Wheelchair Mobility    Modified Rankin (Stroke Patients Only)       Balance                                            Cognition Arousal/Alertness: Lethargic Behavior During Therapy: Flat affect Overall Cognitive Status: Difficult to assess                                 General Comments: Pt not speaking today and limited alertness.  He would perform an exercise and then quickly fall back asleep.  Max VCs to keep patient engaged.      Exercises General Exercises - Lower Extremity Heel Slides: PROM;AAROM;Both;10 reps;Supine Hip ABduction/ADduction:  PROM;AAROM;Both;10 reps;Supine Straight Leg Raises: AAROM;PROM;Both;10 reps;Supine    General Comments        Pertinent Vitals/Pain Pain Assessment: Faces Faces Pain Scale: No hurt    Home Living Family/patient expects to be discharged to:: Private residence Living Arrangements: Children                  Prior Function            PT Goals (current goals can now be found in the care plan section) Acute Rehab PT Goals Patient Stated Goal: pt only states "yes" to any question Potential to Achieve Goals: Fair Progress towards PT goals: Progressing toward goals    Frequency    Min 3X/week      PT Plan Current plan remains appropriate    Co-evaluation              AM-PAC PT "6 Clicks" Mobility   Outcome Measure  Help needed turning from your back to your side while in a  flat bed without using bedrails?: Total Help needed moving from lying on your back to sitting on the side of a flat bed without using bedrails?: Total Help needed moving to and from a bed to a chair (including a wheelchair)?: Total Help needed standing up from a chair using your arms (e.g., wheelchair or bedside chair)?: Total Help needed to walk in hospital room?: Total Help needed climbing 3-5 steps with a railing? : Total 6 Click Score: 6    End of Session Equipment Utilized During Treatment: Gait belt Activity Tolerance: Patient tolerated treatment well Patient left: in bed;with call bell/phone within reach;with bed alarm set Nurse Communication: Mobility status;Need for lift equipment;Other (comment) PT Visit Diagnosis: Other abnormalities of gait and mobility (R26.89);Unsteadiness on feet (R26.81);Muscle weakness (generalized) (M62.81);Other symptoms and signs involving the nervous system (R29.898);Hemiplegia and hemiparesis Hemiplegia - Right/Left: Right Hemiplegia - dominant/non-dominant: Dominant     Time: 6599-3570 PT Time Calculation (min) (ACUTE ONLY): 15 min  Charges:   $Therapeutic Exercise: 8-22 mins                     Erasmo Leventhal , PTA Acute Rehabilitation Services Pager [phone] Office [phone]     Lawrence Mitch Eli Hose 06/21/2019, 3:29 PM

## 2019-06-21 NOTE — Progress Notes (Signed)
Patient was given instructions by the named clinician, all questions were answered, discharged via PTAR at around 1920 with personal belongings

## 2019-06-21 NOTE — TOC Transition Note (Signed)
Transition of Care [facility]) - CM/SW Discharge Note   Patient Details  Name: Douglas [name]. MRN: [id number] Date of Birth: 08/10/1950  [facility] of [facility]) CM/SW Contact:  Baldemar Lenis, LCSW Phone Number: 06/21/2019, 1:37 PM   Clinical Narrative:   Patient discharging home, home health set up with [facility]. Transport set for 3:00 PM.    Final next level of care: Home w Home Health Services Barriers to Discharge: Barriers Resolved   Patient Goals and CMS Choice Patient states their goals for this hospitalization and ongoing recovery are:: patient unable to participate in goal setting due to disorientation CMS Medicare.gov Compare Post Acute Care list provided to:: Patient Represenative (must comment) Choice offered to / list presented to : Adult Children  Discharge Placement                Patient to be transferred to facility by: PTAR Name of family member notified: [name] Patient and family notified of of transfer: 06/21/19  Discharge Plan and Services     Post Acute Care Choice: Skilled Nursing Facility                    HH Arranged: PT, OT, Nurse's Aide, Social Work [facility] Agency: [facility] Date HH Agency Contacted: 06/21/19   Representative spoke with at [facility] Agency: Lupita Leash  Social Determinants of Health (SDOH) Interventions     Readmission Risk Interventions No flowsheet data found.

## 2019-06-21 NOTE — Discharge Instructions (Signed)
Douglas [name] was seen and evaluated in the hospital for an acute change to his mental status. He has been seen twice for this issue in less than one month and has received a very intensive workup for various causes. He was at his baseline on admission and remained at his baseline mental function while in the hospital. It will be unlikely for him to be readmitted for this issue unless he has an objective medical finding.

## 2019-06-21 NOTE — Progress Notes (Signed)
Family Medicine Teaching Service Daily Progress Note Intern Pager: [phone]  Patient name: Douglas [name]. Medical record number: [id number] Date of birth: 05/04/1950 Age: 69 y.o. Gender: male  Primary Care Provider: Ralene Ok, MD Consultants: none Code Status: full  Pt Overview and Major Events to Date:  2/13 admitted for AMS 2/16 MRI with no acute process, EEG w/ e/o eleptogenicity and chronic cortical dysfunction 2/2 to underlying infarct  Assessment and Plan: Douglas [name]. is a 69 y.o. male presenting with AMS . PMH is significant for AMS, h/o CVA with right-sided weakness, HTN.  AMS Patient at cognitive baseline per daughter: only answers yes to questions at baseline.  Medically stable for discharge. Patient does not appear to be in distress. EEG showed evidence of epileptogenicity as well as cortical dysfunction in left frontotemporal likely secondary to underlying infarct without seizures. MRI without acute process. Per neurology, lamictal is adequate treatment for the epileptogenicity seen on EEG, no further tx necessary. - Continue lamictal. - Ionized calcium 5.3, no change from 2 weeks ago 5.2 - PT/OT - continue home calcium supplement - Delirium precautions  Possible leg pain- foot xray shows no acute bony abnormalities.  Right tib-fib x-ray without acute fracture. - tylenol PRN - recommend podiatry follow up for nail care  H/o CVA with residual right-sided hemiplegia- unchanged MRI without acute process, small vessel ischemia and old infarcts - PT/OT - Continue atorvastatin 40 mg daily - continue DAPT: asa, plavix; to end after 3 months of treatment, 06/30/19. After 2/28, continue plavix 75 mg.  HTN- Blood pressures overnight normo to hypotensive. Systolic range over the last 24 hours 100-138, DBP range from 56-94.  Home meds: amlodipine 10 daily, hydralazine 75mg  TID, lisinopril 20mg  BID, metoprolol 50 daily. Metoprolol discontinued in favor of coreg  12.5 mg BID for better blood pressure control, which was achieved. Patient BP goal per JNC8  SBP <150, a difficut target to hit, coreg achieved this, however patient is borderline hypotensive today. Will discontinue hydralazine - continue amlodipine, lisinopril, coreg, indapamide - discontinue hydralazine   DMII- hgb A1c 6.7 this month. CBG in the lad day ranged from 102-302, aspart 11U yesterday. Held home med glipizide, levemir discontinue due to age and risk of hypoglycemia - Increase to metformin glucophage 1000 mg BID - Continue to monitor  BPH with frequent UTIs. Urinalysis negative on presentation. Remove condom cath to reduce risk of CAUDIs - continue tamsulosin  FEN/GI:  Dysphagia 3 Prophylaxis: lovenox  Disposition: discharge to home later today  Subjective:  Patient at cognitive baseline, no distress, resting comfortably  Objective: Temp:  [97.5 F (36.4 C)-99.6 F (37.6 C)] 99.6 F (37.6 C) (02/19 0819) Pulse Rate:  [73-101] 89 (02/19 0819) Resp:  [18-20] 20 (02/19 0819) BP: (100-138)/(56-94) 106/68 (02/19 0819) SpO2:  [97 %-100 %] 100 % (02/19 0819) Physical Exam: General: Only answers yes to questions, does not appear to be in any distress Heart: Regular rate and rhythm with no murmurs appreciated Lungs: CTA bilaterally Abdomen: Bowel sounds present, no abdominal pain Skin: Warm and dry Extremities: no edema  Laboratory: Recent Labs  Lab 06/17/19 1321 06/18/19 0223 06/19/19 0758  WBC 5.7 5.1 7.0  HGB 13.9 13.1 15.5  HCT 42.8 39.6 46.6  PLT 214 205 253   Recent Labs  Lab 06/15/19 1945 06/16/19 0110 06/18/19 0223 06/19/19 0758 06/20/19 0239  NA 144   < > 138 138 137  K 4.8   < > 3.9 3.7 3.8  CL 110   < >  103 99 100  CO2 23   < > 26 26 28   BUN 13   < > 12 10 12   CREATININE 0.85   < > 0.97 0.92 0.98  CALCIUM 8.5*   < > 9.1 9.7 9.3  PROT 6.6  --   --   --   --   BILITOT 0.7  --   --   --   --   ALKPHOS 77  --   --   --   --   ALT 23  --   --    --   --   AST 24  --   --   --   --   GLUCOSE 150*   < > 174* 235* 218*   < > = values in this interval not displayed.   Ionized calcium pending COVID negative  Imaging/Diagnostic Tests: No results found. No results found.  Gladys Damme, MD 06/21/2019, 8:20 AM PGY-1, [place] Intern pager: [phone], text pages welcome

## 2019-06-22 NOTE — Discharge Summary (Addendum)
Family Medicine Teaching [facility] Discharge Summary  Patient name: Douglas [name]. Medical record number: [id number] Date of birth: 1950/12/19 Age: 69 y.o. Gender: male Date of Admission: 06/15/2019  Date of Discharge: 06/21/2019 Admitting Physician: Leeroy Bock, DO  Primary Care Provider: Ralene Ok, MD Consultants:   Indication for Hospitalization: AMS  Discharge Diagnoses/Problem List:  History of CVA with right hemiplegia, aphasia, limited verbal communication Hypertension Type 2 diabetes BPH with frequent UTIs  Disposition: To home  Discharge Condition: Stable   Discharge Exam:  Physical Exam: BP 118/73 (BP Location: Left Arm)   Pulse 80   Temp 97.6 F (36.4 C) (Oral)   Resp 18   Ht 6\' 1"  (1.854 m)   Wt 88.3 kg   SpO2 100%   BMI 25.70 kg/m  General: 69 year old man in no acute distress, can only answer yes to questions and point Heart: Regular rate and rhythm with no murmurs appreciated Lungs: CTAB, no wheezes or crackles noted Abdomen: Soft, nontender, nondistended, bowel sounds present Skin: Warm and dry Extremities: Right side hemiplegia, no edema  Brief Hospital Course:  Patient with chronic CVA deficits has been living at home with his daughter, she brought him to the ED after she thought his baseline mental status was more altered than usual.  She stated that his baseline is that he can point to things he wants, and appears to be understanding at times.  CT head was negative, after speaking further with daughter patient was at his baseline at admission, no evidence of alteration.  Daughter has been very supportive and initially was agreeable to SNF, but due to some concerns about where he would be placed in her ability to see him there, decided to take him home with home health aide.  It appears she may have become overwhelmed and decided to bring him back to the hospital.  It was discussed extensively with the daughter that further admissions  will only be for objective measurable, medical problems.  History of CVA: Last stroke in November 2020.  Patient on DAPT through the end of February.  On March 1, neurology recommends stopping aspirin, but continuing Plavix for antiplatelet therapy.  Hypertension: Patient had very elevated hypertension that was difficult to control on 5 agents.  His home medications included clonidine 0.2 mg daily, amlodipine 10 daily, hydralazine 75 mg 3 times daily, lisinopril 20 mg twice daily, Toprol 50 daily.  Clonidine and hydralazine discontinued.  Indapamide 1.25 mg daily added.  Metoprolol discontinued in favor of Coreg.  Goals for blood pressure per JNC 8 are systolic blood pressure less than 150.   Diabetes: Patient's home meds include Levemir and glipizide, however due to age and comorbidities these were switched in favor of metformin 1000mg  BID.   This is ideal as it able to be crushed up.  Seizure disorder: EEG with a epileptogenicity. Lamictal is appropriate treatment, and Neurology does not recommend other antiepileptic drugs at this time.  Issues for Follow Up:  1. DAPT: can d/c ASA 325 mg on 2/28 and continue plavix 75 mg per neurology. 2. HTN: Current regimen includes amlodipine 10 mg daily, lisinopril 20 mg daily, indapamide 1.25 mg daily, Coreg 12.5 twice daily.   JNC 8 goal SBP less than 150. 3. Diabetes: Metformin Glucophage 1000 mg twice daily.  Continue to follow A1c and blood glucose. 4. Patient's daughter may be susceptible to caregiver burnout given the burden of having to care for a parent with progressively worsening mental status. Make sure  daughter has access to available resources in the community for help in caring for her father.   Significant Procedures: none  Significant Labs and Imaging:  Recent Labs  Lab 06/17/19 1321 06/18/19 0223 06/19/19 0758  WBC 5.7 5.1 7.0  HGB 13.9 13.1 15.5  HCT 42.8 39.6 46.6  PLT 214 205 253   Recent Labs  Lab 06/15/19 1945  06/15/19 1945 06/16/19 0110 06/17/19 1321 06/17/19 1321 06/18/19 0223 06/18/19 0223 06/19/19 0758 06/20/19 0239  NA 144  --   --  136  --  138  --  138 137  K 4.8   < >  --  4.0   < > 3.9   < > 3.7 3.8  CL 110  --   --  102  --  103  --  99 100  CO2 23  --   --  25  --  26  --  26 28  GLUCOSE 150*  --   --  277*  --  174*  --  235* 218*  BUN 13  --   --  17  --  12  --  10 12  CREATININE 0.85   < > 0.91 1.12  --  0.97  --  0.92 0.98  CALCIUM 8.5*  --   --  9.5  --  9.1  --  9.7 9.3  ALKPHOS 77  --   --   --   --   --   --   --   --   AST 24  --   --   --   --   --   --   --   --   ALT 23  --   --   --   --   --   --   --   --   ALBUMIN 3.6  --   --   --   --   --   --   --   --    < > = values in this interval not displayed.    CT Head w/o contrast 06/15/19 IMPRESSION: 1. Chronic encephalomalacia related to previous left PCA and MCA territory infarcts. 2. No acute intracranial process.  Results/Tests Pending at Time of Discharge: none  Discharge Medications:  Allergies as of 06/21/2019      Reactions   Ciprofloxacin Anaphylaxis   Keppra [levetiracetam] Other (See Comments)   Makes the patient very agitated and "not like himself"   Other Nausea And Vomiting, Other (See Comments)   [place]      Medication List    STOP taking these medications   cloNIDine 0.2 MG tablet Commonly known as: CATAPRES   glipiZIDE 10 MG tablet Commonly known as: GLUCOTROL   hydrALAZINE 25 MG tablet Commonly known as: APRESOLINE   insulin detemir 100 UNIT/ML injection Commonly known as: LEVEMIR   metoprolol succinate 25 MG 24 hr tablet Commonly known as: TOPROL-XL   NovoLOG FlexPen 100 UNIT/ML FlexPen Generic drug: insulin aspart     TAKE these medications   Accu-Chek Aviva Plus test strip Generic drug: glucose blood daily.   amLODipine 10 MG tablet Commonly known as: NORVASC Take 1 tablet (10 mg total) by mouth daily.   ascorbic acid 500 MG tablet Commonly known as:  VITAMIN C Take 1 tablet (500 mg total) by mouth daily as needed (if cold-like symptoms are present).   aspirin 325 MG EC tablet Take 1 tablet (325 mg total) by mouth daily.  atorvastatin 40 MG tablet Commonly known as: LIPITOR Take 1 tablet (40 mg total) by mouth daily at 6 PM.   baclofen 10 MG tablet Commonly known as: LIORESAL Take 2 tablets (20 mg total) by mouth 2 (two) times daily.   calcium citrate 950 (200 Ca) MG tablet Commonly known as: CALCITRATE - dosed in mg elemental calcium Take 0.5 tablets (475 mg total) by mouth daily.   carvedilol 12.5 MG tablet Commonly known as: COREG Take 1 tablet (12.5 mg total) by mouth 2 (two) times daily with a meal.   clopidogrel 75 MG tablet Commonly known as: PLAVIX Take 1 tablet (75 mg total) by mouth daily.   indapamide 1.25 MG tablet Commonly known as: LOZOL Take 1 tablet (1.25 mg total) by mouth daily.   lamoTRIgine 100 MG tablet Commonly known as: LAMICTAL Take 1 tablet (100 mg total) by mouth 2 (two) times daily.   lisinopril 20 MG tablet Commonly known as: ZESTRIL Take 1 tablet (20 mg total) by mouth 2 (two) times daily.   liver oil-zinc oxide 40 % ointment Commonly known as: DESITIN Apply topically 2 (two) times daily as needed for irritation (for protection).   metFORMIN 1000 MG tablet Commonly known as: GLUCOPHAGE Take 1 tablet (1,000 mg total) by mouth 2 (two) times daily with a meal.   pantoprazole 40 MG tablet Commonly known as: PROTONIX Take 1 tablet (40 mg total) by mouth daily.   tamsulosin 0.4 MG Caps capsule Commonly known as: FLOMAX Take 1 capsule (0.4 mg total) by mouth daily after supper.   Vitamin D3 25 MCG tablet Commonly known as: Vitamin D Take 0.5 tablets (500 Units total) by mouth daily.       Discharge Instructions: Please refer to Patient Instructions section of EMR for full details.  Patient was counseled important signs and symptoms that should prompt return to medical care,  changes in medications, dietary instructions, activity restrictions, and follow up appointments.   Follow-Up Appointments: Follow-up Information    Jilda Panda, MD. Schedule an appointment as soon as possible for a visit.   Specialty: Internal Medicine Why: make an appointment in the next 1-2 weeks for hospital follow up Contact information: [id number] [place] [id number] [phone]           Gladys Damme, MD 06/22/2019, 2:35 PM PGY-1, [place]  Resident Addendum I have separately seen and examined the patient.  I have discussed the findings and exam with the resident and agree with the above note.  I helped develop the management plan that is described in the resident's note and I agree with the content.     Addison Naegeli, MD PGY-2 [facility] residency program

## 2019-06-24 ENCOUNTER — Other Ambulatory Visit: Payer: Self-pay | Admitting: Family Medicine

## 2019-07-02 LAB — CALCIUM, IONIZED

## 2019-07-14 ENCOUNTER — Other Ambulatory Visit: Payer: Self-pay | Admitting: Family Medicine

## 2019-07-15 ENCOUNTER — Observation Stay (HOSPITAL_COMMUNITY): Payer: Medicare Other

## 2019-07-15 ENCOUNTER — Encounter (HOSPITAL_COMMUNITY): Payer: Self-pay

## 2019-07-15 ENCOUNTER — Emergency Department (HOSPITAL_COMMUNITY): Payer: Medicare Other

## 2019-07-15 ENCOUNTER — Other Ambulatory Visit: Payer: Self-pay

## 2019-07-15 ENCOUNTER — Inpatient Hospital Stay (HOSPITAL_COMMUNITY)
Admission: EM | Admit: 2019-07-15 | Discharge: 2019-07-18 | DRG: 690 | Disposition: A | Discharge status: Skilled Nursing Facility | Payer: Medicare Other | Attending: Family Medicine | Admitting: Family Medicine

## 2019-07-15 DIAGNOSIS — Z888 Allergy status to other drugs, medicaments and biological substances status: Secondary | ICD-10-CM

## 2019-07-15 DIAGNOSIS — S43001A Unspecified subluxation of right shoulder joint, initial encounter: Secondary | ICD-10-CM

## 2019-07-15 DIAGNOSIS — Z7902 Long term (current) use of antithrombotics/antiplatelets: Secondary | ICD-10-CM | POA: Diagnosis not present

## 2019-07-15 DIAGNOSIS — R29810 Facial weakness: Secondary | ICD-10-CM | POA: Diagnosis present

## 2019-07-15 DIAGNOSIS — Z8249 Family history of ischemic heart disease and other diseases of the circulatory system: Secondary | ICD-10-CM

## 2019-07-15 DIAGNOSIS — G9389 Other specified disorders of brain: Secondary | ICD-10-CM | POA: Diagnosis present

## 2019-07-15 DIAGNOSIS — R569 Unspecified convulsions: Secondary | ICD-10-CM | POA: Diagnosis not present

## 2019-07-15 DIAGNOSIS — I69351 Hemiplegia and hemiparesis following cerebral infarction affecting right dominant side: Secondary | ICD-10-CM | POA: Diagnosis not present

## 2019-07-15 DIAGNOSIS — Z79899 Other long term (current) drug therapy: Secondary | ICD-10-CM | POA: Diagnosis not present

## 2019-07-15 DIAGNOSIS — Z20822 Contact with and (suspected) exposure to covid-19: Secondary | ICD-10-CM | POA: Diagnosis present

## 2019-07-15 DIAGNOSIS — R4182 Altered mental status, unspecified: Secondary | ICD-10-CM | POA: Diagnosis present

## 2019-07-15 DIAGNOSIS — Z87440 Personal history of urinary (tract) infections: Secondary | ICD-10-CM

## 2019-07-15 DIAGNOSIS — I69391 Dysphagia following cerebral infarction: Secondary | ICD-10-CM

## 2019-07-15 DIAGNOSIS — N390 Urinary tract infection, site not specified: Secondary | ICD-10-CM | POA: Diagnosis present

## 2019-07-15 DIAGNOSIS — Z91018 Allergy to other foods: Secondary | ICD-10-CM | POA: Diagnosis not present

## 2019-07-15 DIAGNOSIS — I10 Essential (primary) hypertension: Secondary | ICD-10-CM | POA: Diagnosis present

## 2019-07-15 DIAGNOSIS — Z881 Allergy status to other antibiotic agents status: Secondary | ICD-10-CM

## 2019-07-15 DIAGNOSIS — G40909 Epilepsy, unspecified, not intractable, without status epilepticus: Secondary | ICD-10-CM | POA: Diagnosis not present

## 2019-07-15 DIAGNOSIS — E785 Hyperlipidemia, unspecified: Secondary | ICD-10-CM | POA: Diagnosis present

## 2019-07-15 DIAGNOSIS — R1310 Dysphagia, unspecified: Secondary | ICD-10-CM | POA: Diagnosis present

## 2019-07-15 DIAGNOSIS — N400 Enlarged prostate without lower urinary tract symptoms: Secondary | ICD-10-CM | POA: Diagnosis present

## 2019-07-15 DIAGNOSIS — I6990 Unspecified sequelae of unspecified cerebrovascular disease: Secondary | ICD-10-CM | POA: Diagnosis not present

## 2019-07-15 DIAGNOSIS — R4701 Aphasia: Secondary | ICD-10-CM | POA: Diagnosis present

## 2019-07-15 DIAGNOSIS — Z7982 Long term (current) use of aspirin: Secondary | ICD-10-CM | POA: Diagnosis not present

## 2019-07-15 DIAGNOSIS — S43001S Unspecified subluxation of right shoulder joint, sequela: Secondary | ICD-10-CM

## 2019-07-15 DIAGNOSIS — E1159 Type 2 diabetes mellitus with other circulatory complications: Secondary | ICD-10-CM | POA: Diagnosis present

## 2019-07-15 DIAGNOSIS — Z7984 Long term (current) use of oral hypoglycemic drugs: Secondary | ICD-10-CM

## 2019-07-15 DIAGNOSIS — Z8673 Personal history of transient ischemic attack (TIA), and cerebral infarction without residual deficits: Secondary | ICD-10-CM

## 2019-07-15 DIAGNOSIS — R404 Transient alteration of awareness: Secondary | ICD-10-CM | POA: Diagnosis not present

## 2019-07-15 DIAGNOSIS — I639 Cerebral infarction, unspecified: Secondary | ICD-10-CM | POA: Diagnosis not present

## 2019-07-15 DIAGNOSIS — E1142 Type 2 diabetes mellitus with diabetic polyneuropathy: Secondary | ICD-10-CM | POA: Diagnosis present

## 2019-07-15 DIAGNOSIS — D649 Anemia, unspecified: Secondary | ICD-10-CM | POA: Diagnosis present

## 2019-07-15 DIAGNOSIS — E1169 Type 2 diabetes mellitus with other specified complication: Secondary | ICD-10-CM | POA: Diagnosis present

## 2019-07-15 DIAGNOSIS — I6930 Unspecified sequelae of cerebral infarction: Secondary | ICD-10-CM

## 2019-07-15 DIAGNOSIS — E1165 Type 2 diabetes mellitus with hyperglycemia: Secondary | ICD-10-CM | POA: Diagnosis present

## 2019-07-15 DIAGNOSIS — G40209 Localization-related (focal) (partial) symptomatic epilepsy and epileptic syndromes with complex partial seizures, not intractable, without status epilepticus: Secondary | ICD-10-CM | POA: Insufficient documentation

## 2019-07-15 DIAGNOSIS — Z9889 Other specified postprocedural states: Secondary | ICD-10-CM

## 2019-07-15 HISTORY — DX: Acute posthemorrhagic anemia: D62

## 2019-07-15 HISTORY — DX: Unspecified sequelae of cerebral infarction: I69.30

## 2019-07-15 HISTORY — DX: Other abnormal glucose: R73.09

## 2019-07-15 HISTORY — DX: Other specified diabetes mellitus without complications: E13.9

## 2019-07-15 HISTORY — DX: Urinary tract infection, site not specified: N39.0

## 2019-07-15 HISTORY — DX: Cerebral infarction, unspecified: I63.9

## 2019-07-15 HISTORY — DX: Dysphagia following cerebral infarction: I69.391

## 2019-07-15 LAB — CBG MONITORING, ED
Glucose-Capillary: 290 mg/dL — ABNORMAL HIGH (ref 70–99)
Glucose-Capillary: 292 mg/dL — ABNORMAL HIGH (ref 70–99)

## 2019-07-15 LAB — COMPREHENSIVE METABOLIC PANEL
ALT: 22 U/L (ref 0–44)
AST: 13 U/L — ABNORMAL LOW (ref 15–41)
Albumin: 3.2 g/dL — ABNORMAL LOW (ref 3.5–5.0)
Alkaline Phosphatase: 72 U/L (ref 38–126)
Anion gap: 13 (ref 5–15)
BUN: 33 mg/dL — ABNORMAL HIGH (ref 8–23)
CO2: 26 mmol/L (ref 22–32)
Calcium: 9.2 mg/dL (ref 8.9–10.3)
Chloride: 101 mmol/L (ref 98–111)
Creatinine, Ser: 1.25 mg/dL — ABNORMAL HIGH (ref 0.61–1.24)
GFR calc Af Amer: >60 mL/min (ref >60)
GFR calc non Af Amer: 58 mL/min — ABNORMAL LOW (ref >60)
Glucose, Bld: 362 mg/dL — ABNORMAL HIGH (ref 70–99)
Potassium: 3.8 mmol/L (ref 3.5–5.1)
Sodium: 140 mmol/L (ref 135–145)
Total Bilirubin: 1.1 mg/dL (ref 0.3–1.2)
Total Protein: 6.6 g/dL (ref 6.5–8.1)

## 2019-07-15 LAB — CBC
HCT: 40 % (ref 39.0–52.0)
Hemoglobin: 12.6 g/dL — ABNORMAL LOW (ref 13.0–17.0)
MCH: 30.1 pg (ref 26.0–34.0)
MCHC: 31.5 g/dL (ref 30.0–36.0)
MCV: 95.5 fL (ref 80.0–100.0)
Platelets: 207 10*3/uL (ref 150–400)
RBC: 4.19 MIL/uL — ABNORMAL LOW (ref 4.22–5.81)
RDW: 12.7 % (ref 11.5–15.5)
WBC: 7.9 10*3/uL (ref 4.0–10.5)
nRBC: 0 % (ref 0.0–0.2)

## 2019-07-15 LAB — URINALYSIS, ROUTINE W REFLEX MICROSCOPIC
Bilirubin Urine: NEGATIVE
Glucose, UA: 50 mg/dL — AB
Hgb urine dipstick: NEGATIVE
Ketones, ur: NEGATIVE mg/dL
Nitrite: POSITIVE — AB
Protein, ur: NEGATIVE mg/dL
Specific Gravity, Urine: 1.016 (ref 1.005–1.030)
pH: 5 (ref 5.0–8.0)

## 2019-07-15 LAB — RESPIRATORY PANEL BY RT PCR (FLU A&B, COVID)
Influenza A by PCR: NEGATIVE
Influenza B by PCR: NEGATIVE
SARS Coronavirus 2 by RT PCR: NEGATIVE

## 2019-07-15 MED ORDER — LAMOTRIGINE 25 MG PO TABS: 125.0000 mg | ORAL_TABLET | Freq: Two times a day (BID) | ORAL | Status: DC

## 2019-07-15 MED ORDER — INDAPAMIDE 1.25 MG PO TABS
1.2500 mg | ORAL_TABLET | Freq: Every day | ORAL | Status: DC
  Administered 2019-07-16 – 2019-07-18 (×3): 1.25 mg via ORAL
  Filled 2019-07-15 (×3): qty 1

## 2019-07-15 MED ORDER — PANTOPRAZOLE SODIUM 40 MG PO TBEC
40.0000 mg | DELAYED_RELEASE_TABLET | Freq: Every day | ORAL | Status: DC
  Administered 2019-07-16 – 2019-07-18 (×3): 40 mg via ORAL
  Filled 2019-07-15 (×3): qty 1

## 2019-07-15 MED ORDER — TAMSULOSIN HCL 0.4 MG PO CAPS
0.4000 mg | ORAL_CAPSULE | Freq: Every day | ORAL | Status: DC
  Administered 2019-07-15 – 2019-07-17 (×3): 0.4 mg via ORAL
  Filled 2019-07-15 (×3): qty 1

## 2019-07-15 MED ORDER — BACLOFEN 10 MG PO TABS
20.0000 mg | ORAL_TABLET | Freq: Two times a day (BID) | ORAL | Status: DC
  Administered 2019-07-15 – 2019-07-18 (×6): 20 mg via ORAL
  Filled 2019-07-15 (×3): qty 2
  Filled 2019-07-15: qty 1
  Filled 2019-07-15 (×3): qty 2

## 2019-07-15 MED ORDER — ATORVASTATIN CALCIUM 40 MG PO TABS
40.0000 mg | ORAL_TABLET | Freq: Every day | ORAL | Status: DC
  Administered 2019-07-16 – 2019-07-17 (×2): 40 mg via ORAL
  Filled 2019-07-15 (×2): qty 1

## 2019-07-15 MED ORDER — LAMOTRIGINE 25 MG PO TABS
125.0000 mg | ORAL_TABLET | Freq: Two times a day (BID) | ORAL | Status: DC
  Administered 2019-07-15 – 2019-07-18 (×6): 125 mg via ORAL
  Filled 2019-07-15 (×2): qty 5
  Filled 2019-07-15: qty 1
  Filled 2019-07-15 (×2): qty 5
  Filled 2019-07-15: qty 1
  Filled 2019-07-15: qty 5

## 2019-07-15 MED ORDER — CALCIUM CITRATE 950 (200 CA) MG PO TABS
100.0000 mg | ORAL_TABLET | Freq: Every day | ORAL | Status: DC
  Administered 2019-07-16 – 2019-07-18 (×3): 100 mg via ORAL
  Filled 2019-07-15 (×3): qty 1

## 2019-07-15 MED ORDER — CARVEDILOL 12.5 MG PO TABS
12.5000 mg | ORAL_TABLET | Freq: Two times a day (BID) | ORAL | Status: DC
  Administered 2019-07-15 – 2019-07-18 (×6): 12.5 mg via ORAL
  Filled 2019-07-15 (×6): qty 1

## 2019-07-15 MED ORDER — SODIUM CHLORIDE 0.9 % IV BOLUS
1000.0000 mL | Freq: Once | INTRAVENOUS | Status: AC
  Administered 2019-07-15: 1000 mL via INTRAVENOUS

## 2019-07-15 MED ORDER — LISINOPRIL 20 MG PO TABS: 20.0000 mg | ORAL_TABLET | Freq: Two times a day (BID) | ORAL | Status: DC

## 2019-07-15 MED ORDER — CLOPIDOGREL BISULFATE 75 MG PO TABS
75.0000 mg | ORAL_TABLET | Freq: Every day | ORAL | Status: DC
  Administered 2019-07-16 – 2019-07-18 (×3): 75 mg via ORAL
  Filled 2019-07-15 (×3): qty 1

## 2019-07-15 MED ORDER — LISINOPRIL 20 MG PO TABS
20.0000 mg | ORAL_TABLET | Freq: Two times a day (BID) | ORAL | Status: DC
  Administered 2019-07-16 – 2019-07-18 (×5): 20 mg via ORAL
  Filled 2019-07-15 (×5): qty 1

## 2019-07-15 MED ORDER — INSULIN ASPART 100 UNIT/ML ~~LOC~~ SOLN: 0.0000 [IU] | Freq: Three times a day (TID) | SUBCUTANEOUS | Status: DC

## 2019-07-15 MED ORDER — AMLODIPINE BESYLATE 10 MG PO TABS
10.0000 mg | ORAL_TABLET | Freq: Every day | ORAL | Status: DC
  Administered 2019-07-16 – 2019-07-18 (×3): 10 mg via ORAL
  Filled 2019-07-15 (×3): qty 1

## 2019-07-15 MED ORDER — ASCORBIC ACID 500 MG PO TABS: 500.0000 mg | ORAL_TABLET | Freq: Every day | ORAL | Status: DC | PRN

## 2019-07-15 MED ORDER — ENOXAPARIN SODIUM 40 MG/0.4ML ~~LOC~~ SOLN
40.0000 mg | SUBCUTANEOUS | Status: DC
  Administered 2019-07-15 – 2019-07-18 (×4): 40 mg via SUBCUTANEOUS
  Filled 2019-07-15 (×4): qty 0.4

## 2019-07-15 MED ORDER — SODIUM CHLORIDE 0.9% FLUSH: 3.0000 mL | Freq: Once | INTRAVENOUS | Status: DC

## 2019-07-15 MED ORDER — LISINOPRIL 20 MG PO TABS: 20.0000 mg | ORAL_TABLET | Freq: Every day | ORAL | Status: DC

## 2019-07-15 MED ORDER — ASPIRIN EC 325 MG PO TBEC
325.0000 mg | DELAYED_RELEASE_TABLET | Freq: Every day | ORAL | Status: DC
  Administered 2019-07-16 – 2019-07-18 (×3): 325 mg via ORAL
  Filled 2019-07-15 (×3): qty 1

## 2019-07-15 MED ORDER — SODIUM CHLORIDE 0.9 % IV SOLN
100.0000 mg | INTRAVENOUS | Status: AC
  Administered 2019-07-16: 100 mg via INTRAVENOUS
  Filled 2019-07-15: qty 10

## 2019-07-15 MED ORDER — VITAMIN D 25 MCG (1000 UNIT) PO TABS
500.0000 [IU] | ORAL_TABLET | Freq: Every day | ORAL | Status: DC
  Administered 2019-07-15 – 2019-07-18 (×4): 500 [IU] via ORAL
  Filled 2019-07-15 (×5): qty 1

## 2019-07-15 MED ORDER — ZINC OXIDE 40 % EX OINT
TOPICAL_OINTMENT | Freq: Two times a day (BID) | CUTANEOUS | Status: DC | PRN
  Filled 2019-07-15: qty 57

## 2019-07-15 MED ORDER — LAMOTRIGINE 150 MG PO TABS: 150.0000 mg | ORAL_TABLET | Freq: Two times a day (BID) | ORAL | Status: DC

## 2019-07-15 MED ORDER — SODIUM CHLORIDE 0.9 % IV SOLN
1.0000 g | Freq: Once | INTRAVENOUS | Status: AC
  Administered 2019-07-15: 1 g via INTRAVENOUS
  Filled 2019-07-15: qty 10

## 2019-07-15 NOTE — ED Provider Notes (Signed)
[facility] EMERGENCY DEPARTMENT Provider Note   CSN: [id number] Arrival date & time: 07/15/19  1335     History Chief Complaint  Patient presents with  . Altered Mental Status    Douglas [name]. is a 69 y.o. male.  69 yo M with chief complaints of altered mental status.  Patient was trying to be helped to the bedside commode and then lost consciousness.  Has been a bit more sleepy than normal for the past few days.  Otherwise no other specific symptoms are noted by the family.  Patient is nonverbal at baseline.  Level 5 caveat.  The history is provided by the patient.  Altered Mental Status Presenting symptoms: no confusion   Associated symptoms: no abdominal pain, no fever, no headaches, no palpitations, no rash and no vomiting   Illness Severity:  Moderate Onset quality:  Gradual Duration:  2 days Timing:  Constant Progression:  Worsening Chronicity:  New Associated symptoms: no abdominal pain, no chest pain, no congestion, no diarrhea, no fever, no headaches, no myalgias, no rash, no shortness of breath and no vomiting        Past Medical History:  Diagnosis Date  . Diabetes mellitus without complication ([place])   . Hypertension   . Stroke [facility])     Patient Active Problem List   Diagnosis Date Noted  . Late effects of CVA (cerebrovascular accident)   . Pressure injury of skin 06/08/2019  . AMS (altered mental status) 06/05/2019  . Ischemic stroke ([place]) 03/05/2019  . UTI (urinary tract infection) 03/05/2019  . Acute blood loss anemia   . Labile blood pressure   . 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 Labile blood glucose   . Global aphasia   . Cerebral edema ([place]) 04/04/2018  . Renal insufficiency 04/04/2018  . 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 Acute lower UTI   . Type 2 diabetes mellitus with peripheral neuropathy (H[facility])   . Acute embolic stroke ([place])   .  Diabetes 1.5, managed as type 2 ([place])   . Essential hypertension   . Dyslipidemia   . History of CVA with residual deficit   . Dysphagia, post-stroke     Past Surgical History:  Procedure Laterality Date  . IR PATIENT EVAL TECH 0-60 MINS  03/30/2018  . LOOP RECORDER INSERTION N/A 03/08/2019   Procedure: LOOP RECORDER INSERTION;  Surgeon: Constance Haw, MD;  Location: [facility] CV LAB;  Service: Cardiovascular;  Laterality: N/A;  . TEE WITHOUT CARDIOVERSION N/A 04/02/2018   Procedure: TRANSESOPHAGEAL ECHOCARDIOGRAM (TEE);  Surgeon: Skeet Latch, MD;  Location: [facility] ENDOSCOPY;  Service: Cardiovascular;  Laterality: N/A;       Family History  Problem Relation Age of Onset  . Hypertension Mother   . Hypertension Father     Social History   Tobacco Use  . Smoking status: Never Smoker  . Smokeless tobacco: Never Used  Substance Use Topics  . Alcohol use: Not Currently  . Drug use: Never    Home Medications Prior to Admission medications   Medication Sig Start Date End Date Taking? Authorizing Provider  amLODipine (NORVASC) 10 MG tablet Take 1 tablet (10 mg total) by mouth daily. 06/21/19  Yes Autry-Lott, Naaman Plummer, DO  ascorbic acid (VITAMIN C) 500 MG tablet Take 1 tablet (500 mg total) by mouth daily as needed (if cold-like symptoms are present). 06/21/19  Yes Autry-Lott, Naaman Plummer, DO  aspirin  325 MG EC tablet Take 1 tablet (325 mg total) by mouth daily. 06/21/19  Yes Autry-Lott, Randa Evens, DO  atorvastatin (LIPITOR) 40 MG tablet Take 1 tablet (40 mg total) by mouth daily at 6 PM. 06/21/19  Yes Autry-Lott, Randa Evens, DO  baclofen (LIORESAL) 10 MG tablet Take 2 tablets (20 mg total) by mouth 2 (two) times daily. 06/21/19  Yes Autry-Lott, Randa Evens, DO  calcium citrate (CALCITRATE - DOSED IN MG ELEMENTAL CALCIUM) 950 (200 Ca) MG tablet Take 0.5 tablets (475 mg total) by mouth daily. 06/21/19  Yes Autry-Lott, Randa Evens, DO  carvedilol (COREG) 12.5 MG tablet Take 1 tablet (12.5 mg total) by  mouth 2 (two) times daily with a meal. Patient taking differently: Take 12.5 mg by mouth 2 (two) times daily with a meal. Taking once daily 12.5mg  tab 06/21/19  Yes Lockamy, Timothy, DO  clopidogrel (PLAVIX) 75 MG tablet Take 1 tablet (75 mg total) by mouth daily. 06/21/19  Yes Autry-Lott, Randa Evens, DO  indapamide (LOZOL) 1.25 MG tablet Take 1 tablet (1.25 mg total) by mouth daily. 06/21/19  Yes Arlyce Harman, DO  lamoTRIgine (LAMICTAL) 100 MG tablet Take 1 tablet (100 mg total) by mouth 2 (two) times daily. 06/21/19  Yes Autry-Lott, Randa Evens, DO  lisinopril (ZESTRIL) 20 MG tablet Take 1 tablet (20 mg total) by mouth 2 (two) times daily. 06/21/19  Yes Autry-Lott, Randa Evens, DO  liver oil-zinc oxide (DESITIN) 40 % ointment Apply topically 2 (two) times daily as needed for irritation (for protection). 06/09/19  Yes Lockamy, Timothy, DO  pantoprazole (PROTONIX) 40 MG tablet Take 1 tablet (40 mg total) by mouth daily. 06/21/19  Yes Autry-Lott, Randa Evens, DO  tamsulosin (FLOMAX) 0.4 MG CAPS capsule Take 1 capsule (0.4 mg total) by mouth daily after supper. 06/21/19  Yes Autry-Lott, Randa Evens, DO  Vitamin D3 (VITAMIN D) 25 MCG tablet Take 0.5 tablets (500 Units total) by mouth daily. 06/21/19  Yes Autry-Lott, Randa Evens, DO  ACCU-CHEK AVIVA PLUS test strip daily. 12/28/18   [provider]  metFORMIN (GLUCOPHAGE) 1000 MG tablet Take 1 tablet (1,000 mg total) by mouth 2 (two) times daily with a meal. 06/21/19   Arlyce Harman, DO    Allergies    Ciprofloxacin, Keppra [levetiracetam], Metformin and related, and Other  Review of Systems   Review of Systems  Unable to perform ROS: Patient nonverbal  Constitutional: Negative for chills and fever.  HENT: Negative for congestion and facial swelling.   Eyes: Negative for discharge and visual disturbance.  Respiratory: Negative for shortness of breath.   Cardiovascular: Negative for chest pain and palpitations.  Gastrointestinal: Negative for abdominal pain, diarrhea and  vomiting.  Musculoskeletal: Negative for arthralgias and myalgias.  Skin: Negative for color change and rash.  Neurological: Negative for tremors, syncope and headaches.  Psychiatric/Behavioral: Negative for confusion and dysphoric mood.    Physical Exam Updated Vital Signs BP (!) 141/71   Pulse 81   Temp 98.6 F (37 C) (Rectal)   Resp 10   Ht 6' (1.829 m)   Wt 88 kg   SpO2 100%   BMI 26.31 kg/m   Physical Exam Vitals and nursing note reviewed.  Constitutional:      Appearance: He is well-developed.  HENT:     Head: Normocephalic and atraumatic.  Eyes:     Pupils: Pupils are equal, round, and reactive to light.  Neck:     Vascular: No JVD.  Cardiovascular:     Rate and Rhythm: Normal rate and regular rhythm.  Heart sounds: No murmur. No friction rub. No gallop.   Pulmonary:     Effort: No respiratory distress.     Breath sounds: No wheezing.  Abdominal:     General: There is no distension.     Tenderness: There is no guarding or rebound.  Musculoskeletal:        General: Normal range of motion.     Cervical back: Normal range of motion and neck supple.  Skin:    Coloration: Skin is not pale.     Findings: No rash.  Neurological:     Mental Status: He is alert.  Psychiatric:        Behavior: Behavior normal.     ED Results / Procedures / Treatments   Labs (all labs ordered are listed, but only abnormal results are displayed) Labs Reviewed  COMPREHENSIVE METABOLIC PANEL - Abnormal; Notable for the following components:      Result Value   Glucose, Bld 362 (*)    BUN 33 (*)    Creatinine, Ser 1.25 (*)    Albumin 3.2 (*)    AST 13 (*)    GFR calc non Af Amer 58 (*)    All other components within normal limits  CBC - Abnormal; Notable for the following components:   RBC 4.19 (*)    Hemoglobin 12.6 (*)    All other components within normal limits  URINALYSIS, ROUTINE W REFLEX MICROSCOPIC - Abnormal; Notable for the following components:   Glucose, UA  50 (*)    Nitrite POSITIVE (*)    Leukocytes,Ua SMALL (*)    Bacteria, UA RARE (*)    All other components within normal limits  CBG MONITORING, ED - Abnormal; Notable for the following components:   Glucose-Capillary 290 (*)    All other components within normal limits  CBG MONITORING, ED    EKG None  Radiology DG Chest Port 1 View  Result Date: 07/15/2019 CLINICAL DATA:  Altered mental status. Unresponsive. EXAM: PORTABLE CHEST 1 VIEW COMPARISON:  Radiograph 03/05/2019, 04/03/2018 FINDINGS: Loop recorder projects over the left chest wall. Low lung volumes. The cardiomediastinal contours are normal. The lungs are clear. Pulmonary vasculature is normal. No consolidation, pleural effusion, or pneumothorax. No acute osseous abnormalities are seen. Density projecting over the right scapula is likely a bone island. IMPRESSION: Low lung volumes without acute abnormality. Electronically Signed   By: Narda Rutherford M.D.   On: 07/15/2019 14:48    Procedures Procedures (including critical care time)  Medications Ordered in ED Medications  sodium chloride flush (NS) 0.9 % injection 3 mL (0 mLs Intravenous Hold 07/15/19 1514)  sodium chloride 0.9 % bolus 1,000 mL (has no administration in time range)    ED Course  I have reviewed the triage vital signs and the nursing notes.  Pertinent labs & imaging results that were available during my care of the patient were reviewed by me and considered in my medical decision making (see chart for details).    MDM Rules/Calculators/A&P                      69 yo M with a chief complaints of altered mental status.  Patient had an event today that sounded like a syncopal event.  Now appears to be back to his baseline.  EKG without concerning finding.  Obtaining lab work.  Discussed with Dr. Amada Jupiter, neuro, recommends MRI and stat EEG.  Will come evaluate at bedside.  Signed out to Dr.  Haviland, please see her note for further details of care in  the ED.  The patients results and plan were reviewed and discussed.   Any x-rays performed were independently reviewed by myself.   Differential diagnosis were considered with the presenting HPI.  Medications  sodium chloride flush (NS) 0.9 % injection 3 mL (0 mLs Intravenous Hold 07/15/19 1514)  sodium chloride 0.9 % bolus 1,000 mL (has no administration in time range)    Vitals:   07/15/19 1403 07/15/19 1430 07/15/19 1445 07/15/19 1500  BP:  (!) 146/69 (!) 146/73 (!) 141/71  Pulse:  77 78 81  Resp:  11 13 10   Temp: 98.6 F (37 C)     TempSrc: Rectal     SpO2:  99% 100% 100%  Weight:      Height:        Final diagnoses:  Seizure (HCC)    Admission/ observation were discussed with the admitting physician, patient and/or family and they are comfortable with the plan.   Final Clinical Impression(s) / ED Diagnoses Final diagnoses:  Seizure [facility])    Rx / DC Orders ED Discharge Orders    None       [facility], DO 07/15/19 1601

## 2019-07-15 NOTE — Progress Notes (Signed)
EEG complete - results pending 

## 2019-07-15 NOTE — ED Triage Notes (Signed)
Pt here via [facility] from home where family states he became unresponsive while they were helping him up to the bedside commode.  They state that per his norm he is non-verbal, but follows commands and can be assisted to use a bedside commode.  Family states he has been urinating more frequently with malodorous urine. They state he also has been increasingly unresponsive in the last 3 days.  Given 500 bolus en-route.    VS cbg 359 Hr 78 bp 136/70 100% RA rr 16

## 2019-07-15 NOTE — ED Provider Notes (Signed)
Pt signed out by Dr. Adela Lank pending further eval.  Pt d/w neurology who thinks pt is having more seizures.  EEG and MRI ordered.  They will increase lamictal and start vimpat.  UTI will be treated with rocephin.  [facility] residents consulted for admission to get medications for seizures straight.    Pt also noted to have a deformity of the right shoulder.  Xray shows sublux.  Family said shoulder has been going in and out. Pt is developing right sided contractures, so I think that is what is pulling his arm.     Jacalyn Lefevre, MD 07/15/19 [phone]

## 2019-07-15 NOTE — Discharge Planning (Signed)
Pt currently active with [facility] for NA, OT, ST services.

## 2019-07-15 NOTE — ED Notes (Signed)
Daughter at bedside.

## 2019-07-15 NOTE — ED Notes (Signed)
This RN called Mri stating this pt was ready for MRI

## 2019-07-15 NOTE — ED Notes (Signed)
This RN attempted twice for [place] IV access in L. Arm; No success; will reevaluate need for better IV access after MRI

## 2019-07-15 NOTE — ED Notes (Signed)
Pt transported to XRAY °

## 2019-07-15 NOTE — Consult Note (Signed)
Neurology Consultation  Reason for Consult: Syncope versus seizure Referring Physician: Gilford [name]  CC: Syncope versus seizure  History is obtained from: Daughter and wife  HPI: Douglas [name]. is a 69 y.o. male with past medical history of diabetes, hypertension, stroke.  Neurology was asked see patient in the emergency department to evaluate for syncope/seizure.  At baseline patient is nonverbal.  Daughter who is in the room states that at approximately 1130 patient was on the commode and suddenly both her mother and home health worker was yelling her name to come help.  When she arrived at the room the patient was staring forward.  They attempted to get him up and laying down on the floor.  She states that he was flaccid and did not help which is very abnormal.  She states that his lips were blue however she did not note any tonic-clonic, jerking activity.  Patient appeared to return back to his baseline when EMS had arrived.  The daughter did give me the home health aides number.  Her name is Douglas [name] and her number is [phone].  She states that she put Douglas [name] on the commode and then went about cleaning the bathrooms.  About 10 minutes later she noted that he had not called for her and usually he is done apparently by that time she went to check on him and she states "he appeared to be wanting to say he was in trouble".  She states that his neck and eyes were turned to the left and he was grunting.  At that point the wife, daughter and herself got him to the bed where he then closed his eyes and EMS had arrived.  Currently daughter is in the room, stating he usually can follow more commands and he is at this point time.  Patient has no history of seizure in the past.  Of note, home health nurse states that last Friday it was noted that his shoulder was out of socket and put back in place by other healthcare professionals in the house.  However as noted above it looks as though he has  again subluxated his shoulder.  However should be noted also that during this episode patient did not fall, there was no shaking or tonic-clonic movements.  There was no drooling.  And she denies picking him up by the arms.   ED course  Relevant labs include -urine is positive for nitrite and leukocytes, CT head shows-  Chart review  -EEG on 06/16/2019: This study showed evidence of etiopathogenicity as well as cortical dysfunction in the left frontal temporal region likely secondary to underlying infarct.  At that time no seizures were seen throughout the recording -Patient was seen on 03/08/2019 for new small left CR infarct in the setting of old large left MCA and left PCA infarcts.  At that time CTA head showed large chronic left MCA and left PCA occlusion.  Carotid Doppler showed left ICA dampened waveform suggesting of distal obstruction.  2D echo showed greater than 75% EF.  LDL 68.  HbA1c 7.2.    Past Medical History:  Diagnosis Date  . Diabetes mellitus without complication ([place])   . Hypertension   . Stroke [facility])     Family History  Problem Relation Age of Onset  . Hypertension Mother   . Hypertension Father     Social History:   reports that he has never smoked. He has never used smokeless tobacco. He reports previous alcohol use. He reports  that he does not use drugs.  Medications  Current Facility-Administered Medications:  .  sodium chloride flush (NS) 0.9 % injection 3 mL, 3 mL, Intravenous, Once, Douglas Plan, Douglas [name], Stopped at 07/15/19 1514  Current Outpatient Medications:  .  amLODipine (NORVASC) 10 MG tablet, Take 1 tablet (10 mg total) by mouth daily., Disp: 10 tablet, Rfl: 0 .  ascorbic acid (VITAMIN C) 500 MG tablet, Take 1 tablet (500 mg total) by mouth daily as needed (if cold-like symptoms are present)., Disp: 10 tablet, Rfl: 0 .  aspirin 325 MG EC tablet, Take 1 tablet (325 mg total) by mouth daily., Disp: 10 tablet, Rfl: 0 .  atorvastatin (LIPITOR) 40 MG  tablet, Take 1 tablet (40 mg total) by mouth daily at 6 PM., Disp: 10 tablet, Rfl: 0 .  baclofen (LIORESAL) 10 MG tablet, Take 2 tablets (20 mg total) by mouth 2 (two) times daily., Disp: 40 each, Rfl: 0 .  calcium citrate (CALCITRATE - DOSED IN MG ELEMENTAL CALCIUM) 950 (200 Ca) MG tablet, Take 0.5 tablets (475 mg total) by mouth daily., Disp: 5 tablet, Rfl: 0 .  carvedilol (COREG) 12.5 MG tablet, Take 1 tablet (12.5 mg total) by mouth 2 (two) times daily with a meal. (Patient taking differently: Take 12.5 mg by mouth 2 (two) times daily with a meal. Taking once daily 12.5mg  tab), Disp: 60 tablet, Rfl: 0 .  clopidogrel (PLAVIX) 75 MG tablet, Take 1 tablet (75 mg total) by mouth daily., Disp: 10 tablet, Rfl: 0 .  indapamide (LOZOL) 1.25 MG tablet, Take 1 tablet (1.25 mg total) by mouth daily., Disp: 30 tablet, Rfl: 0 .  lamoTRIgine (LAMICTAL) 100 MG tablet, Take 1 tablet (100 mg total) by mouth 2 (two) times daily., Disp: 10 tablet, Rfl: 0 .  lisinopril (ZESTRIL) 20 MG tablet, Take 1 tablet (20 mg total) by mouth 2 (two) times daily., Disp: 20 tablet, Rfl: 0 .  liver oil-zinc oxide (DESITIN) 40 % ointment, Apply topically 2 (two) times daily as needed for irritation (for protection)., Disp: 56.7 g, Rfl: 0 .  pantoprazole (PROTONIX) 40 MG tablet, Take 1 tablet (40 mg total) by mouth daily., Disp: 10 tablet, Rfl: 0 .  tamsulosin (FLOMAX) 0.4 MG CAPS capsule, Take 1 capsule (0.4 mg total) by mouth daily after supper., Disp: 10 capsule, Rfl: 0 .  Vitamin D3 (VITAMIN D) 25 MCG tablet, Take 0.5 tablets (500 Units total) by mouth daily., Disp: 5 tablet, Rfl: 0 .  ACCU-CHEK AVIVA PLUS test strip, daily., Disp: , Rfl:  .  metFORMIN (GLUCOPHAGE) 1000 MG tablet, Take 1 tablet (1,000 mg total) by mouth 2 (two) times daily with a meal., Disp: 60 tablet, Rfl: 0  EVO:[id number] to obtain due to nonverbal at baseline.   Exam: Current vital signs: BP (!) 141/71   Pulse 81   Temp 98.6 F (37 C) (Rectal)   Resp 10    Ht 6' (1.829 m)   Wt 88 kg   SpO2 100%   BMI 26.31 kg/m  Vital signs in last 24 hours: Temp:  [97.9 F (36.6 C)-98.6 F (37 C)] 98.6 F (37 C) (03/15 1403) Pulse Rate:  [77-83] 81 (03/15 1500) Resp:  [10-20] 10 (03/15 1500) BP: (141-154)/(69-83) 141/71 (03/15 1500) SpO2:  [99 %-100 %] 100 % (03/15 1500) Weight:  [88 kg] 88 kg (03/15 1401)   Constitutional: Cachectic Eyes: No scleral injection HENT: No OP obstrucion Head: Normocephalic.  Cardiovascular: Normal rate and regular rhythm.  Respiratory: Effort normal, non-labored breathing  GI: Soft.  No distension. There is no tenderness.  Skin: WDI Neuro: Mental Status: Patient is awake, alert, I believe at this point slightly confused. Speech-nonvocal Unable to give history Cranial Nerves: II: Right hemianopsia III,IV, VI: EOMI without ptosis or diploplia. Pupils equal, round and reactive to light V: Unable to obtain as during exam continued patient became less able to comply VII: Right facial droop VIII: hearing is intact to voice X: Unable to obtain due to misunderstanding me XI: Left shoulder intact right shoulder looks as though it has been anterior subluxated. Motor: Right upper extremity with increased tone, flexion contracture at the elbow and fingers.  Unable to move.  Appears anterior subluxated.  Right leg, increased tone and did not notice any movement.  Moving left arm with 5/5 strength moving left leg antigravity however formal exam was difficult as patient was not understanding what I was asking of him. Sensory: Withdraws from pain on left arm left leg Deep Tendon Reflexes: 2+ and symmetric in the biceps and patellae on the left.  Right brachioradialis 2+ and brisk, right knee jerk 2+ and brisk.  No ankle jerks Plantars: Mute on the right with chronic upgoing toe on the left Cerebellar: Could not obtain  Labs I have reviewed labs in epic and the results pertinent to this consultation are:   CBC     Component Value Date/Time   WBC 7.9 07/15/2019 1437   RBC 4.19 (L) 07/15/2019 1437   HGB 12.6 (L) 07/15/2019 1437   HCT 40.0 07/15/2019 1437   PLT 207 07/15/2019 1437   MCV 95.5 07/15/2019 1437   MCH 30.1 07/15/2019 1437   MCHC 31.5 07/15/2019 1437   RDW 12.7 07/15/2019 1437   LYMPHSABS 1.3 06/17/2019 1321   MONOABS 0.5 06/17/2019 1321   EOSABS 0.2 06/17/2019 1321   BASOSABS 0.0 06/17/2019 1321    CMP     Component Value Date/Time   NA 140 07/15/2019 1437   K 3.8 07/15/2019 1437   CL 101 07/15/2019 1437   CO2 26 07/15/2019 1437   GLUCOSE 362 (H) 07/15/2019 1437   BUN 33 (H) 07/15/2019 1437   CREATININE 1.25 (H) 07/15/2019 1437   CALCIUM 9.2 07/15/2019 1437   CALCIUM 9.3 06/05/2019 1822   PROT 6.6 07/15/2019 1437   ALBUMIN 3.2 (L) 07/15/2019 1437   AST 13 (L) 07/15/2019 1437   ALT 22 07/15/2019 1437   ALKPHOS 72 07/15/2019 1437   BILITOT 1.1 07/15/2019 1437   GFRNONAA 58 (L) 07/15/2019 1437   GFRAA >60 07/15/2019 1437    Lipid Panel     Component Value Date/Time   CHOL 119 03/06/2019 0741   TRIG 76 03/06/2019 0741   HDL 36 (L) 03/06/2019 0741   CHOLHDL 3.3 03/06/2019 0741   VLDL 15 03/06/2019 0741   LDLCALC 68 03/06/2019 0741     Imaging I have reviewed the images obtained:  CT-scan of the brain- Generalized cerebral atrophy with stable findings consistent with chronic left PCA and left MCA territory infarcts. No acute intracranial abnormality   Felicie Morn PA-C [facility] [phone]  M-F  (9:00 am- 5:00 PM)  07/15/2019, 4:20 PM     Assessment: This is a 69 year old male brought to the hospital secondary to episodes most consistent with partial seizures.  He apparently had a prolonged return to baseline, and may not be completely at baseline even now.  Call so  Impression: -History of left MCA/PCA strokes -Likely seizure secondary to UTI which likely lowered his seizure  threshold -Mute  Recommendations: -stat  EEG --techs have  been notified -Patient becomes agitated with Keppra.  He currently is on 200 mg of Lamictal daily divided by 2 doses.  Would increase Lamictal to 125 mg twice daily --give one dose Vimpat 100 mg now --odered -Seizure precautions -Per [employer] statutes, patients with seizures are not allowed to drive until  they have been seizure-free for six months. Use caution when using heavy equipment or power tools. Avoid working on ladders or at heights. Take showers instead of baths. Ensure the water temperature is not too high on the home water heater. Douglas [name] not go swimming alone. When caring for infants or small children, sit down when holding, feeding, or changing them to minimize risk of injury to the child in the event you have a seizure.   Also, Maintain good sleep hygiene. Avoid alcohol.  Ritta Slot, MD [facility] [phone]  If 7pm- 7am, please page neurology on call as listed in [facility].

## 2019-07-15 NOTE — ED Notes (Signed)
Pt transported to CT ?

## 2019-07-15 NOTE — H&P (Addendum)
Family Medicine Teaching [facility] Admission History and Physical Service Pager: [phone]  Patient name: Douglas [name]. Medical record number: [id number] Date of birth: 02-Jun-1950 Age: 69 y.o. Gender: male  Primary Care Provider: Ralene Ok, MD Consultants: Neurology Code Status: Full Preferred Emergency Contact:  Mech [phone]  Chief Complaint: Altered mental status  Assessment and Plan: Douglas [name]. is a 69 y.o. male presenting with altered mental status. PMH is significant for history of CVA with residual right-sided weakness, hypertension, type 2 diabetes, BPH with frequent UTIs.  Altered mental status Patient reportedly more lethargic over the past few days but on the day of admission patient had episode of collapse and staring while being sitting on the bedside commode.  On evaluation patient had baseline right-sided weakness.  Patient was also at his baseline regarding mental status, he followed some commands, seemed to neglect his right upper and lower extremities, and did not speak, but did seem to understand questions and commands, with occasional hand gestures.  CT showing generalized cerebral atrophy with stable findings consistent with chronic left PCA and left MCA territory infarcts.  No acute intracranial abnormality.  Lab work was significant for an elevated glucose of 362, hemoglobin of 12.6, creatinine of 1.25, patient was evaluated and neuro was consulted.  They recommended stat EEG and MRI.  Differential for this includes seizure-like activity vs CVA.  CVA less likely given imaging study results and history of event.  Neurology believes patient is having partial seizures, which could also fit with possible "lethargy" that daughter reports on admission as this could be post-ictal status.  Daughter reports that she would like for patient to go to SNF, suspect given his current status that he will qualify, therefore will go ahead and order SW  consult.  Could consider that UTI has lowered seizure threshold. -Admit to inpatient with Dr. Jennette Kettle as attending  -Neuro consulted, appreciate recommendations -Stat EEG ordered by neuro -MRI brain without contrast -Per neuro patient becomes agitated with Keppra so he is currently on Lamictal -Increasing dose of Lamictal to 125 mg twice daily -1 dose of Vimpat 100 mg ordered by neurology -Seizure precautions -Vitals per routine -Bedside swallow study, patient seems to be at baseline and was previously on dysphagia 3 diet after MBSS during last hospitalization, therefore will order -PT/OT eval and treat  - SW consult for likely SNF placement -Lovenox for DVT PPX  Questionable right shoulder subluxation Concern for right shoulder pain noted by ED provider.  Right shoulder was contracted.  Obtained right shoulder x-ray which showed humeral head appearing to be anteriorly subluxed relative to the glenoid on scapular view.  Given patient's chronic contracture on this side, this is likely chronic in nature.  He does not appear to be in pain, therefore would not attempt to reduce at this time.   - monitor for pain - if seems to be in pain, would consider reduction  UTI  BPH with frequent UTIs Urinalysis on admission was suggestive of UTI.  Small leukocytes positive nitrite.  Urine cultures were not obtained in the ED and the patient received 1 dose of ceftriaxone.  Patient is afebrile and WBC 7.9.  Will attempt to obtain urine cx off this urine, as he has already received antibiotics.  Suspect that his change in urinating behavior in the last week is 2/2 UTI.  Previous cx grew staph hemolyticus, therefore would need to cover for this.  Will likely transition to PO on 3/16. - CTX (3/15- ) -  Continue tamsulosin -Strict I's and O's  H/o CVA  Patient has residual right-sided hemiplegia.  On previous admission PT had recommended SNF placement but the daughter was concerned regarding ratings of the  facilities that were approved.  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  No acute intracranial abnormalities.  At baseline, has residual right arm and leg weakness, speaks in one-word phrases, and communicates needs by pointing with his left hand.  He also appears to neglect his right side and have right hemianopsia. -Continue home ASA and Plavix -Follow-up brain MRI per neuro recs  Normocytic Anemia Hgb 12.6, MCV 95.5.  Was 15.5 3 weeks ago.  No colonoscopy on file. - monitor CBC - consider outpatient colonoscopy  HTN Patient's blood pressure since arrival to the emergency room have ranged from 140/75-154/83.  Patient's home meds include amlodipine 10 mg daily, Coreg 12.5 mg twice daily, indapamide 1.25 mg daily, lisinopril 20 mg BID.  Patient received a 1L NS bolus in ED, but was not tachycardic or hypotensive on arrival. -Continue home meds as soon as swallow eval has been completed  Type 2 diabetes mellitus Home medication includes Metformin 1000 mg twice daily.  Previous hemoglobin A1c was 6.7 in February -Holding home Metformin at this time -Glucose monitoring with meals and morning BMPs - would start sSSI if CBGs consistently elevated    FEN/GI: Dysphagia 3 Prophylaxis: Lovenox  Disposition: Admit to inpatient teaching service for further evaluation including neuro work-up  History of Present Illness:  Douglas [name]. is a 69 y.o. male presenting with altered mental status.  Patient's daughter reports that this a.m., he was sitting on the commode with his nurses aide.  States that he "slumped over in his eye started bulging in his bottom lip started hanging."  She states that she and the nurses aide took him off of the commode and laid him down.  She states that for the entire time he kept his eyes closed and she kept calling him, he would answer yes, but  would not open his eyes.  EMS arrived and patient started to open his eyes.  She reports that he has seemed very "lethargic" today.  States that 2 days ago she thought he was also very "lethargic."  States that he was not eating his usual amount.  Stated that he seemed more upbeat yesterday.  This a.m., however, when she stood him, his legs buckled.  This was prior to the episode on the toilet.  His daughter reports that he has had no recent fevers.  States that normally he uses a urinal, but otherwise he has been "peeing in the wrong places."  Since last week.  She denies any visualized seizure-like activity.  At baseline, patient communicates nonverbally by pointing.  Follows commands.  Cannot walk, but can stand on his feet to transfer.  States that he says occasional one-word answers like yes, ER, while, oh God.  States that his right arm and leg has been weak since his stroke.  She also reports that in the last few days, she has been working with an outpatient social worker to try to get SNF placement for her father as she feels that more intensive rehab would be good for him.   Review Of Systems: Per HPI with the following additions:   Review of Systems  Unable to perform ROS: Patient nonverbal  Constitutional: Negative for fever.  Genitourinary: Positive for frequency.  Neurological: Positive for focal weakness (chronic). Negative for tremors and seizures.    Patient Active Problem List   Diagnosis Date Noted  . Late effects of CVA (cerebrovascular accident)   . Pressure injury of skin 06/08/2019  . AMS (altered mental status) 06/05/2019  . Ischemic stroke (HCC) 03/05/2019  . UTI (urinary tract infection) 03/05/2019  . Acute blood loss anemia   . Labile blood pressure   . 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 Labile blood glucose   . Global aphasia   . Cerebral edema (HCC) 04/04/2018  . Renal insufficiency 04/04/2018  . Left  middle cerebral artery stroke (HCC) 04/04/2018  . Acute lower UTI   . Type 2 diabetes mellitus with peripheral neuropathy (HCC)   . Acute embolic stroke (HCC)   . Diabetes 1.5, managed as type 2 (HCC)   . Essential hypertension   . Dyslipidemia   . History of CVA with residual deficit   . Dysphagia, post-stroke     Past Medical History: Past Medical History:  Diagnosis Date  . Diabetes mellitus without complication (HCC)   . Hypertension   . Stroke [facility](HCC)     Past Surgical History: Past Surgical History:  Procedure Laterality Date  . IR PATIENT EVAL TECH 0-60 MINS  03/30/2018  . LOOP RECORDER INSERTION N/A 03/08/2019   Procedure: LOOP RECORDER INSERTION;  Surgeon: Regan Lemmingamnitz, Will Martin, MD;  Location: [facility] INVASIVE CV LAB;  Service: Cardiovascular;  Laterality: N/A;  . TEE WITHOUT CARDIOVERSION N/A 04/02/2018   Procedure: TRANSESOPHAGEAL ECHOCARDIOGRAM (TEE);  Surgeon: Chilton Siandolph, Tiffany, MD;  [facility];  Service: Cardiovascular;  Laterality: N/A;    Social History: Social History   Tobacco Use  . Smoking status: Never Smoker  . Smokeless tobacco: Never Used  Substance Use Topics  . Alcohol use: Not Currently  . Drug use: Never   Additional social history:  Please also refer to relevant sections of EMR.  Family History: Family History  Problem Relation Age of Onset  . Hypertension Mother   . Hypertension Father     Allergies and Medications: Allergies  Allergen Reactions  . Ciprofloxacin Anaphylaxis  . Keppra [Levetiracetam] Other (See Comments)    Makes the patient very agitated and "not like himself"  . Metformin And Related Other (See Comments)    Renal issue  . Other Nausea And Vomiting and Other (See Comments)    [place]   No current facility-administered medications on file prior to encounter.   Current Outpatient Medications on File Prior to Encounter  Medication Sig Dispense Refill  . amLODipine (NORVASC) 10 MG tablet Take 1 tablet (10 mg  total) by mouth daily. 10 tablet 0  . ascorbic acid (VITAMIN C) 500 MG tablet Take 1 tablet (500 mg total) by mouth daily as needed (if cold-like symptoms are present). 10 tablet 0  . aspirin 325 MG EC tablet Take 1 tablet (325 mg total) by mouth daily. 10 tablet 0  . atorvastatin (LIPITOR) 40 MG tablet Take 1 tablet (40 mg total) by mouth daily at 6 PM. 10 tablet 0  . baclofen (LIORESAL) 10 MG tablet Take 2 tablets (20 mg total) by mouth 2 (two) times daily. 40 each 0  . calcium citrate (CALCITRATE - DOSED IN MG ELEMENTAL CALCIUM) 950 (200 Ca) MG tablet Take 0.5 tablets (475 mg total) by mouth daily. 5 tablet 0  . carvedilol (COREG) 12.5 MG tablet Take 1  tablet (12.5 mg total) by mouth 2 (two) times daily with a meal. (Patient taking differently: Take 12.5 mg by mouth 2 (two) times daily with a meal. Taking once daily 12.5mg  tab) 60 tablet 0  . clopidogrel (PLAVIX) 75 MG tablet Take 1 tablet (75 mg total) by mouth daily. 10 tablet 0  . indapamide (LOZOL) 1.25 MG tablet Take 1 tablet (1.25 mg total) by mouth daily. 30 tablet 0  . lamoTRIgine (LAMICTAL) 100 MG tablet Take 1 tablet (100 mg total) by mouth 2 (two) times daily. 10 tablet 0  . lisinopril (ZESTRIL) 20 MG tablet Take 1 tablet (20 mg total) by mouth 2 (two) times daily. 20 tablet 0  . liver oil-zinc oxide (DESITIN) 40 % ointment Apply topically 2 (two) times daily as needed for irritation (for protection). 56.7 g 0  . pantoprazole (PROTONIX) 40 MG tablet Take 1 tablet (40 mg total) by mouth daily. 10 tablet 0  . tamsulosin (FLOMAX) 0.4 MG CAPS capsule Take 1 capsule (0.4 mg total) by mouth daily after supper. 10 capsule 0  . Vitamin D3 (VITAMIN D) 25 MCG tablet Take 0.5 tablets (500 Units total) by mouth daily. 5 tablet 0  . ACCU-CHEK AVIVA PLUS test strip daily.    . metFORMIN (GLUCOPHAGE) 1000 MG tablet Take 1 tablet (1,000 mg total) by mouth 2 (two) times daily with a meal. 60 tablet 0    Objective: BP (!) 150/84   Pulse 79   Temp  98.6 F (37 C) (Rectal)   Resp 15   Ht 6' (1.829 m)   Wt 88 kg   SpO2 100%   BMI 26.31 kg/m  Physical Exam  Constitutional: He is well-developed, well-nourished, and in no distress. No distress.  HENT:  Head: Normocephalic and atraumatic.  Eyes: Pupils are equal, round, and reactive to light. EOM are normal.  Cardiovascular: Normal rate, regular rhythm and normal heart sounds.  No murmur heard. Pulmonary/Chest: Effort normal and breath sounds normal. No respiratory distress. He has no wheezes.  Abdominal: Soft. Bowel sounds are normal. There is no abdominal tenderness.  Musculoskeletal:     Cervical back: Normal range of motion.     Comments: Right upper extremity is contracted.  Appears anteriorly rotated.  Patient is able to freely move left upper extremity and has 5 out of 5 grip strength  Neurological: He is alert. A cranial nerve deficit (Right hemianopsia,) is present.  Patient would intermittently follow commands.  Was able to lift and move his left lower extremity.  Unable to move right lower extremity  Skin: Skin is warm and dry. He is not diaphoretic.   Labs and Imaging: CBC BMET  Recent Labs  Lab 07/15/19 1437  WBC 7.9  HGB 12.6*  HCT 40.0  PLT 207   Recent Labs  Lab 07/15/19 1437  NA 140  K 3.8  CL 101  CO2 26  BUN 33*  CREATININE 1.25*  GLUCOSE 362*  CALCIUM 9.2     EKG: Sinus rhythm  UA -Glucose 50 -Leukocytes small -Nitrite positive -Bacteria rare  DG Shoulder Right  Result Date: 07/15/2019 CLINICAL DATA:  Right shoulder pain EXAM: RIGHT SHOULDER - 2+ VIEW COMPARISON:  None. FINDINGS: Diffuse osseous demineralization. No fracture identified. Humeral head appears anteriorly subluxed relative to the glenoid on scapular Y-view. Degenerative changes of the glenohumeral and acromioclavicular joints. There is soft tissue swelling overlying the lateral aspect of the shoulder. Visualized right lung field is clear. IMPRESSION: 1. Humeral head appears  anteriorly  subluxed relative to the glenoid on scapular view, which may be projectional. With internal rotation, external rotation, and axillary views would be helpful to exclude a anterior shoulder dislocation, if there is adequate clinical concern. 2. No fracture identified. Electronically Signed   By: Davina Poke D.O.   On: 07/15/2019 17:07   CT Head Wo Contrast  Result Date: 07/15/2019 CLINICAL DATA:  Encephalopathy. EXAM: CT HEAD WITHOUT CONTRAST TECHNIQUE: Contiguous axial images were obtained from the base of the skull through the vertex without intravenous contrast. COMPARISON:  June 15, 2019 FINDINGS: Brain: There is moderate severity cerebral atrophy with widening of the extra-axial spaces and ventricular dilatation. There are areas of decreased attenuation within the white matter tracts of the supratentorial brain, consistent with microvascular disease changes. A large, stable area of encephalomalacia, with adjacent chronic white matter low attenuation, is seen throughout the left cerebral hemisphere. Associated ex vacuo dilatation of the left lateral ventricle is seen. Vascular: No hyperdense vessel or unexpected calcification. Skull: Normal. Negative for fracture or focal lesion. Sinuses/Orbits: No acute finding. Other: None. IMPRESSION: 1. 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 2. No acute intracranial abnormality. Electronically Signed   By: Virgina Norfolk M.D.   On: 07/15/2019 16:40   DG Chest Port 1 View  Result Date: 07/15/2019 CLINICAL DATA:  Altered mental status. Unresponsive. EXAM: PORTABLE CHEST 1 VIEW COMPARISON:  Radiograph 03/05/2019, 04/03/2018 FINDINGS: Loop recorder projects over the left chest wall. Low lung volumes. The cardiomediastinal contours are normal. The lungs are clear. Pulmonary vasculature is normal. No consolidation, pleural effusion, or pneumothorax. No acute osseous  abnormalities are seen. Density projecting over the right scapula is likely a bone island. IMPRESSION: Low lung volumes without acute abnormality. Electronically Signed   By: Keith Rake M.D.   On: 07/15/2019 14:48     Gifford Shave, MD 07/15/2019, 5:53 PM PGY-1, [facility] Intern pager: [phone], text pages welcome  FPTS Upper-Level Resident Addendum   I have independently interviewed and examined the patient. I have discussed the above with the original author and agree with their documentation. My edits for correction/addition/clarification are in green. Please see also any attending notes.   [place] Constable, D.O. PGY-2, [facility] 07/15/2019 6:57 PM  [place] Service pager: [phone] (text pages welcome through [place])

## 2019-07-15 NOTE — ED Notes (Signed)
EEG at bedside.

## 2019-07-15 NOTE — Procedures (Signed)
History: 69 year old male with confusion after seizure  Sedation: None  Technique: This is a 21 channel routine scalp EEG performed at the bedside with bipolar and monopolar montages arranged in accordance to the international 10/20 system of electrode placement. One channel was dedicated to EKG recording.    Background: There is a posterior dominant rhythm of 9 hz which is seen during maximal wakefulness.  There is intrusion of the background of irregular delta and theta range activity.  There does appear to be some attenuation of faster frequencies on the left and a prominence of slow activity in left frontotemporal regions.  Photic stimulation: Physiologic driving is not performed  EEG Abnormalities: 1) left frontotemporal slowing 2) generalized irregular slow activity  Clinical Interpretation: This EEG is consistent with a left frontotemporal focal cerebral dysfunction in the setting of a more generalized nonspecific cerebral dysfunction (encephalopathy).  There was no seizure or evidence of definite seizure predisposition on this study, sharp waves seen previously were not seen today.   Please note that lack of epileptiform activity on EEG does not preclude the possibility of epilepsy.   Ritta Slot, MD [facility] [phone]  If 7pm- 7am, please page neurology on call as listed in AMION.

## 2019-07-16 ENCOUNTER — Encounter (HOSPITAL_COMMUNITY): Payer: Self-pay | Admitting: Family Medicine

## 2019-07-16 DIAGNOSIS — S43001S Unspecified subluxation of right shoulder joint, sequela: Secondary | ICD-10-CM

## 2019-07-16 DIAGNOSIS — S43001A Unspecified subluxation of right shoulder joint, initial encounter: Secondary | ICD-10-CM

## 2019-07-16 DIAGNOSIS — R569 Unspecified convulsions: Secondary | ICD-10-CM

## 2019-07-16 DIAGNOSIS — R404 Transient alteration of awareness: Secondary | ICD-10-CM

## 2019-07-16 LAB — BASIC METABOLIC PANEL
Anion gap: 13 (ref 5–15)
BUN: 24 mg/dL — ABNORMAL HIGH (ref 8–23)
CO2: 27 mmol/L (ref 22–32)
Calcium: 9.5 mg/dL (ref 8.9–10.3)
Chloride: 102 mmol/L (ref 98–111)
Creatinine, Ser: 1.12 mg/dL (ref 0.61–1.24)
GFR calc Af Amer: >60 mL/min (ref >60)
GFR calc non Af Amer: >60 mL/min (ref >60)
Glucose, Bld: 340 mg/dL — ABNORMAL HIGH (ref 70–99)
Potassium: 4 mmol/L (ref 3.5–5.1)
Sodium: 142 mmol/L (ref 135–145)

## 2019-07-16 LAB — GLUCOSE, CAPILLARY
Glucose-Capillary: 325 mg/dL — ABNORMAL HIGH (ref 70–99)
Glucose-Capillary: 371 mg/dL — ABNORMAL HIGH (ref 70–99)
Glucose-Capillary: 369 mg/dL — ABNORMAL HIGH (ref 70–99)
Glucose-Capillary: 313 mg/dL — ABNORMAL HIGH (ref 70–99)

## 2019-07-16 LAB — CBC
HCT: 40.1 % (ref 39.0–52.0)
Hemoglobin: 12.8 g/dL — ABNORMAL LOW (ref 13.0–17.0)
MCH: 29.4 pg (ref 26.0–34.0)
MCHC: 31.9 g/dL (ref 30.0–36.0)
MCV: 92.2 fL (ref 80.0–100.0)
Platelets: 211 10*3/uL (ref 150–400)
RBC: 4.35 MIL/uL (ref 4.22–5.81)
RDW: 12.4 % (ref 11.5–15.5)
WBC: 6.3 10*3/uL (ref 4.0–10.5)
nRBC: 0 % (ref 0.0–0.2)

## 2019-07-16 MED ORDER — SULFAMETHOXAZOLE-TRIMETHOPRIM 800-160 MG PO TABS: 1.0000 | ORAL_TABLET | Freq: Two times a day (BID) | ORAL | Status: DC

## 2019-07-16 MED ORDER — RESOURCE THICKENUP CLEAR PO POWD
ORAL | Status: DC | PRN
  Filled 2019-07-16: qty 125

## 2019-07-16 MED ORDER — INSULIN ASPART 100 UNIT/ML ~~LOC~~ SOLN
0.0000 [IU] | Freq: Three times a day (TID) | SUBCUTANEOUS | Status: DC
  Administered 2019-07-17 (×2): 5 [IU] via SUBCUTANEOUS
  Administered 2019-07-17: 3 [IU] via SUBCUTANEOUS
  Administered 2019-07-18: 9 [IU] via SUBCUTANEOUS
  Administered 2019-07-18: 5 [IU] via SUBCUTANEOUS

## 2019-07-16 MED ORDER — INSULIN ASPART 100 UNIT/ML ~~LOC~~ SOLN
0.0000 [IU] | Freq: Every day | SUBCUTANEOUS | Status: DC
  Administered 2019-07-17: 4 [IU] via SUBCUTANEOUS

## 2019-07-16 MED ORDER — SULFAMETHOXAZOLE-TRIMETHOPRIM 200-40 MG/5ML PO SUSP
20.0000 mL | Freq: Two times a day (BID) | ORAL | Status: DC
  Administered 2019-07-16 – 2019-07-18 (×5): 20 mL via ORAL
  Filled 2019-07-16 (×6): qty 20

## 2019-07-16 MED ORDER — ACETAMINOPHEN 325 MG PO TABS: 650.0000 mg | ORAL_TABLET | Freq: Four times a day (QID) | ORAL | Status: DC | PRN

## 2019-07-16 MED ORDER — INSULIN ASPART 100 UNIT/ML ~~LOC~~ SOLN
0.0000 [IU] | Freq: Three times a day (TID) | SUBCUTANEOUS | Status: DC
  Administered 2019-07-16: 9 [IU] via SUBCUTANEOUS

## 2019-07-16 MED ORDER — SULFAMETHOXAZOLE-TRIMETHOPRIM 400-80 MG PO TABS: 1.0000 | ORAL_TABLET | Freq: Two times a day (BID) | ORAL | Status: DC

## 2019-07-16 MED ORDER — SODIUM CHLORIDE 0.9 % IV SOLN
1.0000 g | INTRAVENOUS | Status: DC
  Filled 2019-07-16: qty 10

## 2019-07-16 NOTE — Evaluation (Signed)
Clinical/Bedside Swallow Evaluation Patient Details  Name: Douglas [name]. MRN: [id number] Date of Birth: 1951/03/09  Today's Date: 07/16/2019 Time: SLP Start Time (ACUTE ONLY): 1510 SLP Stop Time (ACUTE ONLY): 1524 SLP Time Calculation (min) (ACUTE ONLY): 14 min  Past Medical History:  Past Medical History:  Diagnosis Date  . Acute blood loss anemia   . Acute embolic stroke ([place])   . Acute lower UTI   . AMS (altered mental status) 06/05/2019  . Cerebral edema ([place]) 04/04/2018  . Diabetes 1.5, managed as type 2 ([place])   . Diabetes mellitus without complication ([place])   . History of CVA with residual deficit   . History of Dysphagia, post-stroke   . Hypertension   . Ischemic stroke ([place]) 03/05/2019  . Labile blood glucose   . Left middle cerebral artery stroke ([place]) 04/04/2018  . Renal insufficiency 04/04/2018  . Stroke ([place])   . UTI (urinary tract infection) 03/05/2019   Past Surgical History:  Past Surgical History:  Procedure Laterality Date  . IR PATIENT EVAL TECH 0-60 MINS  03/30/2018  . LOOP RECORDER INSERTION N/A 03/08/2019   Procedure: LOOP RECORDER INSERTION;  Surgeon: Constance Haw, MD;  Location: [facility] CV LAB;  Service: Cardiovascular;  Laterality: N/A;  . TEE WITHOUT CARDIOVERSION N/A 04/02/2018   Procedure: TRANSESOPHAGEAL ECHOCARDIOGRAM (TEE);  Surgeon: Skeet Latch, MD;  Location: [facility] ENDOSCOPY;  Service: Cardiovascular;  Laterality: N/A;   HPI:  69 y.o. male presenting with altered mental status. PMH is significant for history of CVA with residual right-sided weakness, hypertension, type 2 diabetes, BPH with frequent UTIs.  Pt has a hx of oropharyngeal dysphagia with most recent recommendations for Dysphagia 3 (soft) solids and honey-thick liquids following MBS on 06/18/19.     Assessment / Plan / Recommendation Clinical Impression  Pt was seen for a bedside swallow evaluation.  He has a known hx of oropharyngeal dysphagia with most recent  recommendations for Dysphagia 3 (soft) solids and honey-thick liquids following MBS on 06/18/19.  Oral mech exam was remarkable for facial asymmetry on the R and reduced facial ROM on the R (likely baseline from previous CVA).  Pt did not attempt to verbalize during this evaluation and he demonstrated difficulty following commands (baseline aphasia).  He consumed trials of ice chips, thin liquid, honey-thick liquid, puree, ground solids, and soft solids.  Pt exhibited prolonged mastication with ground solids and soft solids and suspect prolonged AP transport and delayed swallow initiation with all trials. An immediate cough was observed following thin liquid trials.  No overt s/sx of aspiration were observed following any additional trials.  Recommend diet change to Dysphagia 2 (fine chop) solids and honey-thick liquids (no straws) with medications administered crushed in puree.  Pt will benefit from full supervision to assist with feeding and to cue for compensatory strategies.  SLP will f/u to monitor diet tolerance.    SLP Visit Diagnosis: Dysphagia, oropharyngeal phase (R13.12)    Aspiration Risk  Moderate aspiration risk    Diet Recommendation Dysphagia 2 (Fine chop);Honey-thick liquid   Liquid Administration via: Cup;No straw Medication Administration: Crushed with puree Supervision: Staff to assist with self feeding;Full supervision/cueing for compensatory strategies Compensations: Minimize environmental distractions;Slow rate;Small sips/bites;Follow solids with liquid Postural Changes: Seated upright at 90 degrees    Other  Recommendations Oral Care Recommendations: Oral care BID;Staff/trained caregiver to provide oral care Other Recommendations: Order thickener from pharmacy;Remove water pitcher   Follow up Recommendations Home health SLP;Skilled Nursing facility  Frequency and Duration min 2x/week  2 weeks       Prognosis Prognosis for Safe Diet Advancement: Fair Barriers to  Reach Goals: Language deficits      Swallow Study   General Date of Onset: 07/16/19 HPI: 69 y.o. male presenting with altered mental status. PMH is significant for history of CVA with residual right-sided weakness, hypertension, type 2 diabetes, BPH with frequent UTIs.  Pt has a hx of oropharyngeal dysphagia with most recent recommendations for Dysphagia 3 (soft) solids and honey-thick liquids following MBS on 06/18/19.   Type of Study: Bedside Swallow Evaluation Previous Swallow Assessment: See HPI Diet Prior to this Study: Dysphagia 3 (soft);Thin liquids Temperature Spikes Noted: No Respiratory Status: Room air History of Recent Intubation: No Behavior/Cognition: Alert;Requires cueing;Cooperative Oral Cavity Assessment: Within Functional Limits Oral Care Completed by SLP: No Oral Cavity - Dentition: Adequate natural dentition;Missing dentition Self-Feeding Abilities: Needs assist Patient Positioning: Upright in bed Baseline Vocal Quality: Not observed Volitional Cough: Strong Volitional Swallow: Unable to elicit    Oral/Motor/Sensory Function Overall Oral Motor/Sensory Function: Mild impairment Facial ROM: Reduced right Facial Symmetry: Abnormal symmetry right   Ice Chips Ice chips: Within functional limits Presentation: Spoon   Thin Liquid Thin Liquid: Impaired Presentation: Spoon;Straw Pharyngeal  Phase Impairments: Suspected delayed Swallow;Cough - Immediate    Nectar Thick Nectar Thick Liquid: Not tested   Honey Thick Honey Thick Liquid: Within functional limits Presentation: Cup;Straw   Puree Puree: Within functional limits Presentation: Spoon   Solid     Solid: Impaired Presentation: Spoon Oral Phase Impairments: Impaired mastication Oral Phase Functional Implications: Impaired mastication;Oral residue;Prolonged oral transit     Villa Herb M.S., CCC-SLP Acute Rehabilitation Services Office: [phone]  Shanon Rosser Jolon Degante 07/16/2019,3:39 PM

## 2019-07-16 NOTE — Progress Notes (Signed)
Family Medicine Teaching Service Daily Progress Note Intern Pager: [phone]  Patient name: Douglas [name]. Medical record number: [id number] Date of birth: [birth year] Age: 69 y.o. Gender: male  Primary Care Provider: Ralene Ok, MD Consultants: Neurology Code Status: FULL   Pt Overview and Major Events to Date:  3/16: At baseline Assessment and Plan:  Altered mental status Alert sitting up in bed watching TV, able to follow commands on exam such as squeeze my hand, lift up your arm and leg etc. Left UE 5/5 strength, right UE increased tone, flexion contracture at elbow and fingers, unable to move.  Appears to have neglect of right side. Unable to gage sensation of extremeties accurately. Repeats 'yes' to most questions asked and communicates with the left arm.Marland Kitchen Appears at baseline per previous notes.  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  No acute intracranial abnormality.  EEG: left frontotemporal focal cerebral dysfunction in the setting of a more generalized nonspecific cerebral dysfunction (encephalopathy). no seizure or evidence of definite seizure predisposition -Neuro following, appreciate recommendations if patient becomes agitated with Keppra so he is currently on Lamictal, Increasing dose of Lamictal to 125 mg twice daily, 1 dose of Vimpat 100 mg ordered by neurology -MRI brain without contrast pending  -Seizure precautions -Vitals per routine -Bedside swallow study, patient seems to be at baseline and was previously on dysphagia 3 diet after MBSS during last hospitalization, therefore will order -PT/OT eval and treat  -SW consult for likely SNF placement  Questionable right shoulder subluxation Concern for right shoulder pain noted by ED provider.  Right shoulder was contracted. Obtained right shoulder x-ray which showed humeral head appearing to be anteriorly subluxed relative to the  glenoid on scapular view. Given patient's chronic contracture on this side, this is likely chronic in nature. He does not appear to be in pain, therefore would not attempt to reduce at this time.   - monitor for pain - if seems to be in pain, would consider reduction  UTI  BPH with frequent UTIs UA on admission was suggestive of UTI: small leukocytes positive nitrite.  Urine cultures were not obtained in the ED and the patient received 1 dose of ceftriaxone.  Patient is afebrile and WBC 6.3. Will attempt to obtain urine cx off this urine, as he has already received antibiotics. Suspect that his change in urinating behavior in the last week is 2/2 UTI. Previous cx grew staph hemolyticus, therefore would need to cover for this. -CTX (3/15- ), consider starting nitrofurantoin or Keflex today for 6 additional days -Continue tamsulosin -Strict I's and O's  H/o CVA Patient has residual right-sided hemiplegia.  On previous admission PT had recommended SNF placement but the daughter was concerned regarding ratings of the facilities that were approved.  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  No acute intracranial abnormalities.  At baseline, has residual right arm and leg weakness, speaks in one-word phrases, and communicates needs by pointing with his left hand.  He also appears to neglect his right side and have right hemianopsia. -Continue home ASA and Plavix -Follow-up brain MRI per neuro recs  Normocytic Anemia Hgb 12.6>12.8, MCV 95.5.  Was 15.5 3 weeks ago.  No colonoscopy on file. - Monitor CBC -Consider outpatient colonoscopy  HTN BP 147/77 Home meds include amlodipine 10 mg daily, Coreg 12.5 mg twice  daily, indapamide 1.25 mg daily, lisinopril 20 mg BID.   -Continue home meds   Type 2 diabetes mellitus CBG: 292, 290  Home medication includes Metformin 1000 mg  twice daily.  Previous hemoglobin A1c was 6.7 in February -Holding home Metformin at this time -Glucose monitoring with meals and morning BMPs -Would start sSSI if CBGs consistently elevated  FEN/GI: Dysphagia 3 EXB:MWUXLKG   Disposition: likely SNF  Subjective:  Appears well today and comfortable in bed.  Objective: Temp:  [97.9 F (36.6 C)-98.7 F (37.1 C)] 98.6 F (37 C) (03/16 0822) Pulse Rate:  [74-88] 74 (03/16 0822) Resp:  [10-20] 14 (03/16 0107) BP: (140-158)/(69-85) 145/78 (03/16 0822) SpO2:  [99 %-100 %] 100 % (03/16 0822) Weight:  [88 kg] 88 kg (03/15 1401)   Physical Exam: General: Well-appearing 69 year old male, pleasant Cardiovascular: S1 and S2 present, RRR Respiratory: CTAB, normal work of breathing Abdomen: Abdomen soft nontender, bowel sounds present Extremities: No peripheral edema Neurological exam: As described above  Laboratory: Recent Labs  Lab 07/15/19 1437 07/16/19 0223  WBC 7.9 6.3  HGB 12.6* 12.8*  HCT 40.0 40.1  PLT 207 211   Recent Labs  Lab 07/15/19 1437 07/16/19 0223  NA 140 142  K 3.8 4.0  CL 101 102  CO2 26 27  BUN 33* 24*  CREATININE 1.25* 1.12  CALCIUM 9.2 9.5  PROT 6.6  --   BILITOT 1.1  --   ALKPHOS 72  --   ALT 22  --   AST 13*  --   GLUCOSE 362* 340*      Imaging/Diagnostic Tests: DG Shoulder Right  Result Date: 07/15/2019 CLINICAL DATA:  Right shoulder pain EXAM: RIGHT SHOULDER - 2+ VIEW COMPARISON:  None. FINDINGS: Diffuse osseous demineralization. No fracture identified. Humeral head appears anteriorly subluxed relative to the glenoid on scapular Y-view. Degenerative changes of the glenohumeral and acromioclavicular joints. There is soft tissue swelling overlying the lateral aspect of the shoulder. Visualized right lung field is clear. IMPRESSION: 1. Humeral head appears anteriorly subluxed relative to the glenoid on scapular view, which may be projectional. With internal rotation, external rotation, and  axillary views would be helpful to exclude a anterior shoulder dislocation, if there is adequate clinical concern. 2. No fracture identified. Electronically Signed   By: Duanne Guess D.O.   On: 07/15/2019 17:07   CT Head Wo Contrast  Result Date: 07/15/2019 CLINICAL DATA:  Encephalopathy. EXAM: CT HEAD WITHOUT CONTRAST TECHNIQUE: Contiguous axial images were obtained from the base of the skull through the vertex without intravenous contrast. COMPARISON:  June 15, 2019 FINDINGS: Brain: There is moderate severity cerebral atrophy with widening of the extra-axial spaces and ventricular dilatation. There are areas of decreased attenuation within the white matter tracts of the supratentorial brain, consistent with microvascular disease changes. A large, stable area of encephalomalacia, with adjacent chronic white matter low attenuation, is seen throughout the left cerebral hemisphere. Associated ex vacuo dilatation of the left lateral ventricle is seen. Vascular: No hyperdense vessel or unexpected calcification. Skull: Normal. Negative for fracture or focal lesion. Sinuses/Orbits: No acute finding. Other: None. IMPRESSION: 1. 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 2. No acute intracranial abnormality. Electronically Signed   By: Aram Candela M.D.   On: 07/15/2019 16:40   DG Chest Port 1 View  Result Date: 07/15/2019 CLINICAL DATA:  Altered mental status. Unresponsive. EXAM: PORTABLE CHEST 1 VIEW COMPARISON:  Radiograph 03/05/2019, 04/03/2018 FINDINGS: Loop  recorder projects over the left chest wall. Low lung volumes. The cardiomediastinal contours are normal. The lungs are clear. Pulmonary vasculature is normal. No consolidation, pleural effusion, or pneumothorax. No acute osseous abnormalities are seen. Density projecting over the right scapula is likely a bone island. IMPRESSION: Low lung volumes without acute abnormality.  Electronically Signed   By: Keith Rake M.D.   On: 07/15/2019 14:48   EEG adult  Result Date: 07/15/2019 Greta Doom, MD     07/15/2019  7:04 PM History: 70 year old male with confusion after seizure Sedation: None Technique: This is a 21 channel routine scalp EEG performed at the bedside with bipolar and monopolar montages arranged in accordance to the international 10/20 system of electrode placement. One channel was dedicated to EKG recording. Background: There is a posterior dominant rhythm of 9 hz which is seen during maximal wakefulness.  There is intrusion of the background of irregular delta and theta range activity.  There does appear to be some attenuation of faster frequencies on the left and a prominence of slow activity in left frontotemporal regions. Photic stimulation: Physiologic driving is not performed EEG Abnormalities: 1) left frontotemporal slowing 2) generalized irregular slow activity Clinical Interpretation: This EEG is consistent with a left frontotemporal focal cerebral dysfunction in the setting of a more generalized nonspecific cerebral dysfunction (encephalopathy). There was no seizure or evidence of definite seizure predisposition on this study, sharp waves seen previously were not seen today.  Please note that lack of epileptiform activity on EEG does not preclude the possibility of epilepsy. Roland Rack, MD [facility] [phone] If 7pm- 7am, please page neurology on call as listed in [place].   Lattie Haw, MD 07/16/2019, 9:30 AM PGY-1, [place] Intern pager: [phone], text pages welcome

## 2019-07-16 NOTE — Progress Notes (Addendum)
Occupational Therapy Evaluation Patient Details Name: Douglas [name]. MRN: [id number] DOB: 03/06/1951 Today's Date: 07/16/2019    History of Present Illness  69 y.o. male presenting with altered mental status. PMH is significant for history of CVA with residual right-sided weakness, hypertension, type 2 diabetes, BPH with frequent UTIs.   Clinical Impression   Patient has a history of L CVA and significant R hemiplegia.  Patient's R shoulder is subluxed ~2 inches and anteriorly, R UE contracted and has 0/5 strength.  L UE moves well and has good strength.  Patient demonstrated how he uses L hand to help stretch R hand.  Today patient is not able to follow simple commands and is mostly non-verbal (sometimes says "yeah", "no").  He also perseverates and moves L arm in repeated, robotic type movements.  According to previous encounters, patient lives with daughter and requires assist with ADLs and transfers at baseline.  Today patient required max/total assist x2 with bed mobility and stand.  Will continue to follow with OT acutely to address the deficits listed below.       Follow Up Recommendations  SNF    Equipment Recommendations  Other (comment)(Determine at next location)    Recommendations for Other Services       Precautions / Restrictions Precautions Precautions: Fall Precaution Comments: R shoulder sublux Restrictions Weight Bearing Restrictions: No      Mobility Bed Mobility Overal bed mobility: Needs Assistance Bed Mobility: Rolling;Supine to Sit;Sit to Supine Rolling: Total assist;+2 for physical assistance   Supine to sit: Max assist;+2 for physical assistance Sit to supine: Max assist;+2 for physical assistance   General bed mobility comments: Total A to roll to L side, did not attempt rolling to the R due to significant R shoulder subluxation  Transfers Overall transfer level: Needs assistance Equipment used: None Transfers: Sit to/from Stand Sit to  Stand: Total assist;+2 physical assistance         General transfer comment: Total A x 2 to stand with bed elevated, knees blocked, and trunk supported. Able to stand approximately halfway into an upright position before lowering back to a seated position.    Balance Overall balance assessment: Needs assistance Sitting-balance support: Feet supported;Single extremity supported;No upper extremity supported Sitting balance-Leahy Scale: Fair Sitting balance - Comments: Pt tends to hold onto bed rail with his L hand however is able to sit EOB without UE support, min guard for balance Postural control: Right lateral lean Standing balance support: Bilateral upper extremity supported Standing balance-Leahy Scale: Zero Standing balance comment: Unable to stand fully with total A x 2                           ADL either performed or assessed with clinical judgement   ADL Overall ADL's : Needs assistance/impaired         Upper Body Bathing: Bed level;Maximal assistance   Lower Body Bathing: Total assistance;Sitting/lateral leans   Upper Body Dressing : Maximal assistance;Sitting   Lower Body Dressing: Total assistance;Sitting/lateral leans   Toilet Transfer: Total assistance           Functional mobility during ADLs: +2 for physical assistance;Maximal assistance;Total assistance General ADL Comments: With grooming/feeding/ UE ADLs that can be completed with one hand, patient may be able to do them spontaneously ibut was not able to cognitively follow commands to complete task     Vision Baseline Vision/History: (Likely some R field cut - patient turns head  to look R)       Perception     Praxis      Pertinent Vitals/Pain Pain Assessment: Faces Faces Pain Scale: Hurts little more Pain Location: R shoulder With R UE movement Pain Descriptors / Indicators: Grimacing Pain Intervention(s): Limited activity within patient's tolerance;Monitored during session      Hand Dominance Left   Extremity/Trunk Assessment Upper Extremity Assessment Upper Extremity Assessment: Defer to OT evaluation RUE Deficits / Details: Gross strength 0/5. Increased tone and contracted in a flexor synergy with DIP/PIP at 45 degrees flexion, wrist at 20degrees flexion, elbow at 70 degrees flexion and shoulder internally rotated.  R shoulder significantly subluxed ~2 inches and anteriorly displaced.  RUE Sensation: (Patient unable to make this known) RUE Coordination: decreased fine motor;decreased gross motor LUE Deficits / Details: Strength/ROM [facility]   Lower Extremity Assessment Lower Extremity Assessment: RLE deficits/detail;LLE deficits/detail RLE Deficits / Details: Considerable tone into hip and knee flexion; 3/4 MAS LLE Deficits / Details: hard to determine due to difficulty following commands, pt able to perform supine SLR and heel slide       Communication Communication Communication: Expressive difficulties;Receptive difficulties   Cognition Arousal/Alertness: Awake/alert Behavior During Therapy: Flat affect Overall Cognitive Status: History of cognitive impairments - at baseline                                 General Comments: Patient was able to follow ~20% of one step commands.   General Comments  Vitals WNL throughout tx.     Exercises Exercises: Other exercises Other Exercises Other Exercises: Trunk control working on sitting EOB without assistance. Min guard for balance. Sits EOB approximately 10 minutes.   Shoulder Instructions      Home Living Family/patient expects to be discharged to:: Private residence Living Arrangements: Children Available Help at Discharge: Family;Available 24 hours/day;Other (Comment);Personal care attendant Type of Home: Apartment Home Access: Level entry     Home Layout: One level               Home Equipment: Walker - 2 wheels;Bedside commode;Cane - single point;Shower seat;Wheelchair -  manual;Hospital bed;Other (comment)   Additional Comments: Home Living entered from prior encounter - patient unable to give history and daughter was contacted, though number given did not reach her.      Prior Functioning/Environment Level of Independence: Needs assistance  Gait / Transfers Assistance Needed: requires assist to transfer to [facility]. ADL's / Homemaking Assistance Needed: Requires assist from PCA for ADLs, completing bed baths Communication / Swallowing Assistance Needed: Pt is nonverbal; inconsistently shakes head yes/no to questions Comments: PLOF from previous encounter - attempting to contact daughter for up to date info        OT Problem List: Decreased strength;Decreased range of motion;Decreased activity tolerance;Impaired balance (sitting and/or standing);Impaired vision/perception;Decreased coordination;Decreased cognition;Decreased safety awareness;Cardiopulmonary status limiting activity;Impaired tone;Impaired UE functional use;Pain      OT Treatment/Interventions: Self-care/ADL training;Therapeutic exercise;Neuromuscular education;Manual therapy;Therapeutic activities;Cognitive remediation/compensation;Patient/family education;Balance training    OT Goals(Current goals can be found in the care plan section) Acute Rehab OT Goals Patient Stated Goal: none stated OT Goal Formulation: Patient unable to participate in goal setting Time For Goal Achievement: 07/30/19 Potential to Achieve Goals: Fair  OT Frequency: Min 2X/week   Barriers to D/C:            Co-evaluation PT/OT/SLP Co-Evaluation/Treatment: Yes Reason for Co-Treatment: Complexity of the patient's impairments (multi-system involvement);For patient/therapist  safety;To address functional/ADL transfers PT goals addressed during session: Mobility/safety with mobility;Strengthening/ROM OT goals addressed during session: ADL's and self-care      AM-PAC OT "6 Clicks" Daily Activity     Outcome Measure  Help from another person eating meals?: A Lot Help from another person taking care of personal grooming?: A Lot Help from another person toileting, which includes using toliet, bedpan, or urinal?: Total Help from another person bathing (including washing, rinsing, drying)?: Total Help from another person to put on and taking off regular upper body clothing?: A Lot Help from another person to put on and taking off regular lower body clothing?: Total 6 Click Score: 9   End of Session Equipment Utilized During Treatment: Gait belt Nurse Communication: Mobility status  Activity Tolerance: Patient tolerated treatment well Patient left: in bed;with call bell/phone within reach;with bed alarm set  OT Visit Diagnosis: Unsteadiness on feet (R26.81);Other abnormalities of gait and mobility (R26.89);Muscle weakness (generalized) (M62.81);Other symptoms and signs involving the nervous system (R29.898);Hemiplegia and hemiparesis;Pain Hemiplegia - Right/Left: Right Hemiplegia - dominant/non-dominant: Non-Dominant Hemiplegia - caused by: Cerebral infarction Pain - Right/Left: Right Pain - part of body: Shoulder;Arm                Time: 8588-5027 OT Time Calculation (min): 39 min Charges:  OT General Charges $OT Visit: 1 Visit OT Evaluation $OT Eval Moderate Complexity: 1 [address], OTR/L   Adella Hare 07/16/2019, 12:49 PM

## 2019-07-16 NOTE — NC FL2 (Signed)
[place] MEDICAID FL2 LEVEL OF CARE SCREENING TOOL     IDENTIFICATION  Patient Name: Douglas [name]. Birthdate: [birth year] Sex: male Admission Date (Current Location): 07/15/2019  [facility] and [place] Number:  Producer, television/film/video and Address:  [facility] [place], [address] [place], [place] [address]      Provider Number:    Attending Physician Name and Address:  McDiarmid, Leighton Roach, MD  Relative Name and Phone Number:  Teancum Brule [phone]    Current Level of Care: Hospital Recommended Level of Care: Skilled Nursing Facility Prior Approval Number:    Date Approved/Denied:   PASRR Number: [id number]  Discharge Plan: SNF    Current Diagnoses: Patient Active Problem List   Diagnosis Date Noted  . Shoulder subluxation, right, sequela 07/16/2019  . Altered mental status 07/15/2019  . Seizure-like activity (HCC) 07/15/2019  . Late effects of CVA (cerebrovascular accident)   . Pressure injury of skin 06/08/2019  . History of loop recorder 03/08/2019  . 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 Global aphasia   . Type 2 diabetes mellitus with peripheral neuropathy (HCC)   . Hypertension associated with diabetes (HCC)   . Hyperlipidemia associated with type 2 diabetes mellitus (HCC)   . History of Dysphagia, post-stroke     Orientation RESPIRATION BLADDER Height & Weight     Self(mute)  Normal Incontinent Weight: 88 kg Height:  6' (182.9 cm)  BEHAVIORAL SYMPTOMS/MOOD NEUROLOGICAL BOWEL NUTRITION STATUS    Convulsions/Seizures(currently on vimpat) Continent Diet(Dysphagia with honey thick liquids)  AMBULATORY STATUS COMMUNICATION OF NEEDS Skin   Total Care(maximove) Non-Verbally PU Stage and Appropriate Care(stage II right buttocks)   PU Stage 2 Dressing: No Dressing                   Personal Care Assistance Level of Assistance  Bathing, Feeding, Dressing Bathing Assistance:  Maximum assistance Feeding assistance: Maximum assistance Dressing Assistance: Maximum assistance     Functional Limitations Info  Speech, Sight, Hearing Sight Info: Adequate Hearing Info: Adequate Speech Info: Impaired(mute)    SPECIAL CARE FACTORS FREQUENCY  PT (By licensed PT), OT (By licensed OT), Speech therapy     PT Frequency: 5X OT Frequency: 5X     Speech Therapy Frequency: 3X      Contractures Contractures Info: Present(right upper and lower extremity)    Additional Factors Info  Code Status, Allergies Code Status Info: Full Allergies Info: Ciprofloxacin, Keppra , Metformin And Related,           Current Medications (07/16/2019):  This is the current hospital active medication list Current Facility-Administered Medications  Medication Dose Route Frequency Provider Last Rate Last Admin  . acetaminophen (TYLENOL) tablet 650 mg  650 mg Oral Q6H PRN Meccariello, Solmon Ice, DO      . amLODipine (NORVASC) tablet 10 mg  10 mg Oral Daily Derrel Nip, MD   10 mg at 07/16/19 1009  . ascorbic acid (VITAMIN C) tablet 500 mg  500 mg Oral Daily PRN Derrel Nip, MD      . aspirin EC tablet 325 mg  325 mg Oral Daily Derrel Nip, MD   325 mg at 07/16/19 1008  . atorvastatin (LIPITOR) tablet 40 mg  40 mg Oral q1800 Derrel Nip, MD      . baclofen (LIORESAL) tablet 20 mg  20 mg Oral BID Derrel Nip, MD   20 mg at 07/16/19 1019  .  calcium citrate (CALCITRATE - dosed in mg elemental calcium) tablet 100 mg of elemental calcium  100 mg of elemental calcium Oral Daily Gifford Shave, MD   100 mg of elemental calcium at 07/16/19 1010  . carvedilol (COREG) tablet 12.5 mg  12.5 mg Oral BID WC Gifford Shave, MD   12.5 mg at 07/16/19 1009  . cholecalciferol (VITAMIN D3) tablet 500 Units  500 Units Oral Daily Gifford Shave, MD   500 Units at 07/16/19 1019  . clopidogrel (PLAVIX) tablet 75 mg  75 mg Oral Daily Gifford Shave, MD   75 mg at 07/16/19 1009  .  enoxaparin (LOVENOX) injection 40 mg  40 mg Subcutaneous Q24H Gifford Shave, MD   40 mg at 07/15/19 2201  . indapamide (LOZOL) tablet 1.25 mg  1.25 mg Oral Daily Gifford Shave, MD   1.25 mg at 07/16/19 1009  . lamoTRIgine (LAMICTAL) tablet 125 mg  125 mg Oral BID Gifford Shave, MD   125 mg at 07/16/19 1008  . lisinopril (ZESTRIL) tablet 20 mg  20 mg Oral BID Meccariello, Bailey J, DO   20 mg at 07/16/19 1008  . liver oil-zinc oxide (DESITIN) 40 % ointment   Topical BID PRN Gifford Shave, MD      . pantoprazole (PROTONIX) EC tablet 40 mg  40 mg Oral Daily Gifford Shave, MD   40 mg at 07/16/19 1008  . Resource ThickenUp Clear   Oral PRN McDiarmid, Blane Ohara, MD      . sodium chloride flush (NS) 0.9 % injection 3 mL  3 mL Intravenous Once Gifford Shave, MD   Stopped at 07/15/19 1514  . sulfamethoxazole-trimethoprim (BACTRIM) 200-40 MG/5ML suspension 20 mL  20 mL Oral Q12H Gifford Shave, MD   20 mL at 07/16/19 1223  . tamsulosin (FLOMAX) capsule 0.4 mg  0.4 mg Oral QPC supper Gifford Shave, MD   0.4 mg at 07/15/19 2200     Discharge Medications: Please see discharge summary for a list of discharge medications.  Relevant Imaging Results:  Relevant Lab Results:   Additional Information SS# [id number]  Angelita Ingles, RN

## 2019-07-16 NOTE — Evaluation (Signed)
Physical Therapy Evaluation Patient Details Name: Douglas [name]. MRN: [id number] DOB: [birth year] Today's Date: 07/16/2019   History of Present Illness   69 y.o. male presenting with altered mental status. PMH is significant for history of CVA with residual right-sided weakness, hypertension, type 2 diabetes, BPH with frequent UTIs.    Clinical Impression  Pt is unable to provide subjective history and his daughter's phone number listed is not ringing at this time. His PLOF and home living provided from previous hospitalization notes, appears to be up to date. Pt lives with his daughter and per previous reports, has a personal care attendant. He requires assist to his [facility] and assist from daughter and PCA for ADL's. Pt has significant R UE/LE weakness and tone from a previous CVA. Pt is total A x 2 to roll and max A x 2 supine<>sit. Pt has fair EOB sitting balance, requiring min guard to maintain his balance unsupported. Pt is total A x 2 sit<>stand and unable to full maintain upright positioning despite trunk/ hip assistance and blocking of knees. SNF is most appropriate d/c location, daughter is requesting SNF as well. PT will continue to follow pt acutely.     Follow Up Recommendations SNF;Supervision/Assistance - 24 hour    Equipment Recommendations  Other (comment)(TBD- none at this time)       Precautions / Restrictions Precautions Precautions: Fall Precaution Comments: R shoulder sublux Restrictions Weight Bearing Restrictions: No      Mobility  Bed Mobility Overal bed mobility: Needs Assistance Bed Mobility: Rolling;Supine to Sit;Sit to Supine Rolling: Total assist;+2 for physical assistance   Supine to sit: Max assist;+2 for physical assistance Sit to supine: Max assist;+2 for physical assistance   General bed mobility comments: Total A to roll to L side, did not attempt rolling to the R due to significant R shoulder subluxation  Transfers Overall transfer level:  Needs assistance Equipment used: None Transfers: Sit to/from Stand Sit to Stand: Total assist;+2 physical assistance         General transfer comment: Total A x 2 to stand with bed elevated, knees blocked, and trunk supported. Able to stand approximately halfway into an upright position before lowering back to a seated position.  Ambulation/Gait             General Gait Details: unable, does not perform at baseline      Balance Overall balance assessment: Needs assistance Sitting-balance support: Feet supported;Single extremity supported;No upper extremity supported Sitting balance-Leahy Scale: Fair Sitting balance - Comments: Pt tends to hold onto bed rail with his L hand however is able to sit EOB without UE support, min guard for balance Postural control: Right lateral lean Standing balance support: Bilateral upper extremity supported Standing balance-Leahy Scale: Zero Standing balance comment: Unable to stand fully with total A x 2                             Pertinent Vitals/Pain Pain Assessment: Faces Faces Pain Scale: Hurts little more Pain Location: R shoulder With R UE movement Pain Descriptors / Indicators: Grimacing Pain Intervention(s): Limited activity within patient's tolerance;Monitored during session    Home Living Family/patient expects to be discharged to:: Private residence Living Arrangements: Children Available Help at Discharge: Family;Available 24 hours/day;Other (Comment);Personal care attendant Type of Home: Apartment Home Access: Level entry     Home Layout: One level Home Equipment: Walker - 2 wheels;Bedside commode;Cane - single point;Shower seat;Wheelchair - [employer]  bed;Other (comment) Additional Comments: Home Living entered from prior encounter - patient unable to give history and daughter was contacted, though number given did not reach her.    Prior Function Level of Independence: Needs assistance   Gait /  Transfers Assistance Needed: requires assist to transfer to [facility].  ADL's / Homemaking Assistance Needed: Requires assist from PCA for ADLs, completing bed baths  Comments: PLOF from previous encounter - attempting to contact daughter for up to date info     Hand Dominance   Dominant Hand: Left    Extremity/Trunk Assessment   Upper Extremity Assessment Upper Extremity Assessment: Defer to OT evaluation    Lower Extremity Assessment Lower Extremity Assessment: RLE deficits/detail;LLE deficits/detail RLE Deficits / Details: Considerable tone into hip and knee flexion; 3/4 MAS LLE Deficits / Details: hard to determine due to difficulty following commands, pt able to perform supine SLR and heel slide       Communication   Communication: Expressive difficulties;Receptive difficulties  Cognition Arousal/Alertness: Awake/alert Behavior During Therapy: Flat affect Overall Cognitive Status: History of cognitive impairments - at baseline                                 General Comments: Patient was able to follow ~20% of one step commands.      General Comments General comments (skin integrity, edema, etc.): Vitals WNL throughout tx.     Exercises Other Exercises Other Exercises: Trunk control working on sitting EOB without assistance. Min guard for balance. Sits EOB approximately 10 minutes.   Assessment/Plan    PT Assessment Patient needs continued PT services  PT Problem List Decreased strength;Decreased activity tolerance;Decreased balance;Decreased mobility;Decreased range of motion;Decreased coordination;Decreased cognition;Decreased knowledge of use of DME;Decreased safety awareness;Decreased knowledge of precautions;Impaired tone;Pain       PT Treatment Interventions Functional mobility training;Therapeutic activities;Therapeutic exercise;Balance training;Neuromuscular re-education;Cognitive remediation;Patient/family education;Wheelchair mobility training     PT Goals (Current goals can be found in the Care Plan section)  Acute Rehab PT Goals Patient Stated Goal: none stated PT Goal Formulation: Patient unable to participate in goal setting Time For Goal Achievement: 07/16/19 Potential to Achieve Goals: Fair    Frequency Min 2X/week   Barriers to discharge        Co-evaluation PT/OT/SLP Co-Evaluation/Treatment: Yes Reason for Co-Treatment: Complexity of the patient's impairments (multi-system involvement);For patient/therapist safety;To address functional/ADL transfers PT goals addressed during session: Mobility/safety with mobility;Strengthening/ROM OT goals addressed during session: ADL's and self-care       AM-PAC PT "6 Clicks" Mobility  Outcome Measure Help needed turning from your back to your side while in a flat bed without using bedrails?: Total Help needed moving from lying on your back to sitting on the side of a flat bed without using bedrails?: Total Help needed moving to and from a bed to a chair (including a wheelchair)?: Total Help needed standing up from a chair using your arms (e.g., wheelchair or bedside chair)?: Total Help needed to walk in hospital room?: Total Help needed climbing 3-5 steps with a railing? : Total 6 Click Score: 6    End of Session Equipment Utilized During Treatment: Gait belt Activity Tolerance: Patient limited by fatigue Patient left: in bed;with call bell/phone within reach;with bed alarm set Nurse Communication: Mobility status PT Visit Diagnosis: Muscle weakness (generalized) (M62.81);Difficulty in walking, not elsewhere classified (R26.2);Pain;Hemiplegia and hemiparesis Hemiplegia - Right/Left: Right Hemiplegia - dominant/non-dominant: Non-dominant Hemiplegia - caused by: Cerebral  infarction(previous CVA; non-acute) Pain - Right/Left: Right Pain - part of body: Shoulder    Time: 6433-2951 PT Time Calculation (min) (ACUTE ONLY): 40 min   Charges:   PT Evaluation $PT Eval  Moderate Complexity: 1 Mod PT Treatments $Therapeutic Activity: 8-22 mins        Jodean Lima, PT, DPT Acute Rehabilitation Services Office [phone]  Jodean Lima 07/16/2019, 12:36 PM

## 2019-07-16 NOTE — TOC Initial Note (Signed)
Transition of Care (TOC) - Initial/Assessment Note    Patient Details  Name: Douglas [name]. MRN: [id number] Date of Birth: 1950/06/14  [facility]) CM/SW Contact:    Beckie Busing, RN Phone Number: 07/16/2019, 4:05 PM  Clinical Narrative:     Therapy recommending SNF, spoke with daughter who is in agreement to fax info out to facilities in [facility]. FL2 completed patient faxed out. TOC following.              Expected Discharge Plan: Skilled Nursing Facility Barriers to Discharge: Continued Medical Work up, SNF Pending bed offer   Patient Goals and CMS Choice   CMS Medicare.gov Compare Post Acute Care list provided to:: Patient Represenative (must comment)(daughter) Choice offered to / list presented to : Adult Children  Expected Discharge Plan and Services Expected Discharge Plan: Skilled Nursing Facility In-house Referral: Clinical Social Work Discharge Planning Services: CM Consult Post Acute Care Choice: Skilled Nursing Facility                                        Prior Living Arrangements/Services   Lives with:: Adult Children Patient language and need for interpreter reviewed:: Yes        Need for Family Participation in Patient Care: Yes (Comment) Care giver support system in place?: Yes (comment) Current home services: DME, Home PT Criminal Activity/Legal Involvement Pertinent to Current Situation/Hospitalization: No - Comment as needed  Activities of Daily Living      Permission Sought/Granted Permission sought to share information with : Oceanographer granted to share information with : Yes, Verbal Permission Granted              Emotional Assessment       Orientation: : (mute)      Admission diagnosis:  Seizure (HCC) [R56.9] Altered mental status [R41.82] Seizure-like activity (HCC) [R56.9] Shoulder subluxation, right, initial encounter [S43.001A] Patient Active Problem List    Diagnosis Date Noted  . Shoulder subluxation, right, sequela 07/16/2019  . Altered mental status 07/15/2019  . Seizure-like activity (HCC) 07/15/2019  . Late effects of CVA (cerebrovascular accident)   . Pressure injury of skin 06/08/2019  . History of loop recorder 03/08/2019  . 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 Global aphasia   . Type 2 diabetes mellitus with peripheral neuropathy (HCC)   . Hypertension associated with diabetes (HCC)   . Hyperlipidemia associated with type 2 diabetes mellitus (HCC)   . History of Dysphagia, post-stroke    PCP:  Ralene Ok, MD Pharmacy:   [facility] [phone] - [place], [place] - [address] AT [address] OF [facility] [address] [place] [address] Phone: [phone] Fax: [phone]     Social Determinants of Health (SDOH) Interventions    Readmission Risk Interventions No flowsheet data found.

## 2019-07-16 NOTE — Progress Notes (Signed)
On call MD notified of HS CBG = 313 with no HS coverage ordered.

## 2019-07-16 NOTE — Progress Notes (Signed)
Received patient from ER to room 2W29 via stretcher at 0045. Transferred to bed and positioned for comfort. Oriented to bed, room and unit. In no acute distress.

## 2019-07-16 NOTE — Progress Notes (Addendum)
Inpatient Diabetes Program Recommendations  AACE/ADA: New Consensus Statement on Inpatient Glycemic Control (2015)  Target Ranges:  Prepandial:   less than 140 mg/dL      Peak postprandial:   less than 180 mg/dL (1-2 hours)      Critically ill patients:  140 - 180 mg/dL   Lab Results  Component Value Date   GLUCAP 292 (H) 07/15/2019   HGBA1C 6.7 (H) 06/05/2019    Review of Glycemic Control   Results for Douglas [name], Douglas [name] (MRN [id number]) as of 07/16/2019 10:40  Ref. Range 07/15/2019 14:09 07/15/2019 21:49  Glucose-Capillary Latest Ref Range: 70 - 99 mg/dL 255 (H) 001 (H)   Diabetes history: DM2 Outpatient Diabetes medications: Levemir 16 units QD if CBG is >125 + Novolog SSI (per daughter) Current orders for Inpatient glycemic control: None  Inpatient Diabetes Program Recommendations:     Please consider Novolog 0-9 TID with meals and 0-5 QHS May need small dose of basal as well Lantus 8 units  Thank you, Dulce Sellar, RN, BSN Diabetes Coordinator Inpatient Diabetes Program [phone] (team pager from 8a-5p)

## 2019-07-16 NOTE — Progress Notes (Signed)
NEUROLOGY PROGRESS NOTE  Subjective: Patient appears back to baseline.  He is mute but attempts to follow commands.  No further seizures overnight per nursing.  Of note: I did talk to his home health aide to find out what his baseline is.  She states that on a normal day he will answer "yes", often times he will laugh if he thinks something funny.  He is often very pleasant.  This is how patient is acting today thus he appears to be back to baseline.  Exam: Vitals:   07/16/19 0107 07/16/19 0822  BP: (!) 147/77 (!) 145/78  Pulse: 76 74  Resp: 14   Temp: 98.7 F (37.1 C) 98.6 F (37 C)  SpO2: 100% 100%     Physical Exam  Constitutional: Appears well-developed and well-nourished.  Psych: Affect appropriate to situation Eyes: No scleral injection HENT: No OP obstrucion Head: Normocephalic.  Cardiovascular: Normal rate and regular rhythm.  Respiratory: Effort normal, non-labored breathing GI: Soft.  No distension. There is no tenderness.  Skin: WDI   Neuro:  Mental Status: Alert, does not reliably follow commands(holds up open hand when I ask him to make a fist) Cranial Nerves: II: Right hemianopsia III,IV, VI: ptosis not present, extra-ocular motions intact bilaterally pupils equal, round, reactive to light and accommodation V,VII: Right facial droop, facial light touch sensation normal bilaterally VIII: hearing normal bilaterally IX,X: Palate rises midline XI: bilateral shoulder shrug XII: midline tongue extension Motor: Right upper extremity with increased tone, flexion contracture at elbow and fingers.  Unable to move right arm and leg.  Again his right shoulder anterior subluxated.  Right leg, increased tone with no movement.  Moving left arm with 5/5 strength.  Left leg antigravity with 5/5 strength. Sensory: Withdraws from pain on left leg and arm Deep Tendon Reflexes: 2+ and symmetric in the biceps and patella on the left.  Brachioradialis 2+ and brisk, knee jerk 2+  and brisk no ankle jerks  plantars: Right: downgoing   Left: Upgoing     Medications:  Scheduled: . amLODipine  10 mg Oral Daily  . aspirin  325 mg Oral Daily  . atorvastatin  40 mg Oral q1800  . baclofen  20 mg Oral BID  . calcium citrate  100 mg of elemental calcium Oral Daily  . carvedilol  12.5 mg Oral BID WC  . cholecalciferol  500 Units Oral Daily  . clopidogrel  75 mg Oral Daily  . enoxaparin (LOVENOX) injection  40 mg Subcutaneous Q24H  . indapamide  1.25 mg Oral Daily  . lamoTRIgine  125 mg Oral BID  . lisinopril  20 mg Oral BID  . pantoprazole  40 mg Oral Daily  . sodium chloride flush  3 mL Intravenous Once  . tamsulosin  0.4 mg Oral QPC supper    Pertinent Labs/Diagnostics: Urinalysis does show positive for nitrite and leukocytes-rare bacteria/urine culture pending  DG Shoulder Right  Result Date: 07/15/2019 CLINICAL DATA:  Right shoulder pain EXAM: RIGHT SHOULDER - 2+ VIEW COMPARISON:  None. FINDINGS: Diffuse osseous demineralization. No fracture identified. Humeral head appears anteriorly subluxed relative to the glenoid on scapular Y-view. Degenerative changes of the glenohumeral and acromioclavicular joints. There is soft tissue swelling overlying the lateral aspect of the shoulder. Visualized right lung field is clear. IMPRESSION: 1. Humeral head appears anteriorly subluxed relative to the glenoid on scapular view, which may be projectional. With internal rotation, external rotation, and axillary views would be helpful to exclude a anterior shoulder dislocation, if  there is adequate clinical concern. 2. No fracture identified. Electronically Signed   By: Davina Poke D.O.   On: 07/15/2019 17:07   CT Head Wo Contrast  Result Date: 07/15/2019 . IMPRESSION: 1. 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 2. No acute intracranial abnormality. Electronically Signed   By: Virgina Norfolk M.D.    On: 07/15/2019 16:40   DG Chest Port 1 View   EEG adult  Result Date: 07/15/2019 Greta Doom, MD     07/15/2019  7:04 PM History: 69 year old male with confusion after seizure Sedation: None Technique: This is a 21 channel routine scalp EEG performed at the bedside with bipolar and monopolar montages arranged in accordance to the international 10/20 system of electrode placement. One channel was dedicated to EKG recording. Background: There is a posterior dominant rhythm of 9 hz which is seen during maximal wakefulness.  There is intrusion of the background of irregular delta and theta range activity.  There does appear to be some attenuation of faster frequencies on the left and a prominence of slow activity in left frontotemporal regions. Photic stimulation: Physiologic driving is not performed EEG Abnormalities: 1) left frontotemporal slowing 2) generalized irregular slow activity Clinical Interpretation: This EEG is consistent with a left frontotemporal focal cerebral dysfunction in the setting of a more generalized nonspecific cerebral dysfunction (encephalopathy). There was no seizure or evidence of definite seizure predisposition on this study, sharp waves seen previously were not seen today.  Please note that lack of epileptiform activity on EEG does not preclude the possibility of epilepsy. Roland Rack, MD [facility] [phone] If 7pm- 7am, please page neurology on call as listed in [place].      Assessment:  This 69 year old male brought to the hospital secondary to episodes most consistent with partial seizures.  There was question of prolonged return to baseline, however today he appears back to baseline.  Patient,s Lamictal was increased to 125 mg twice daily.    Impression: -Breakthrough seizure in the setting of UTI -Right anterior subluxation of shoulder  Recommendations: -Continue Lamictal at 125 mg twice daily -Anterior subluxated shoulder per  primary team -Given patient has known seizures and is on Lamictal.  Along with the fact that he is back to baseline.  Do not see any need for MRI -Seizure precautions -Per [facility] statutes, patients with seizures are not allowed to drive until  they have been seizure-free for six months. Use caution when using heavy equipment or power tools. Avoid working on ladders or at heights. Take showers instead of baths. Ensure the water temperature is not too high on the home water heater. Do not go swimming alone. When caring for infants or small children, sit down when holding, feeding, or changing them to minimize risk of injury to the child in the event you have a seizure.   Also, Maintain good sleep hygiene. Avoid alcohol.  -After talking to patient's home health aide and finding patient is back to his baseline neurology will sign off at this time  Etta Quill PA-C [facility] [phone]   07/16/2019, 8:31 AM  I have seen the patient reviewed the above note.  Breakthrough seizures in a patient with known seizure disorder.  Unclear cause of breakthrough, though UTI is a possibility.  He is on a relatively low-dose of Lamictal and therefore I think increasing this is reasonable at this time.  At this point, he can follow-up with his  outpatient neurologist, no further inpatient recommendations unless he were to have further episodes.  No indication for MRI at this time.  Please call if neurology can be of any further assistance.   Ritta Slot, MD [facility] [phone]  If 7pm- 7am, please page neurology on call as listed in [facility].

## 2019-07-16 NOTE — Discharge Summary (Signed)
[facility] Discharge Summary  Patient name: Douglas [name]. Medical record number: [id number] Date of birth: 1950-11-02 Age: 69 y.o. Gender: male Date of Admission: 07/15/2019  Date of Discharge: 07/18/19 Admitting Physician: Dickie La, MD  Primary Care Provider: Jilda Panda, MD Consultants: Neurology  Indication for Hospitalization: Altered mental status  Discharge Diagnoses/Problem List:  UTI History of CVA BPH Hypertension Type 2 diabetes  Disposition: SNF  Discharge Condition: Medically stable for discharge  Discharge Exam:   General: Alert and cooperative and appears to be in no acute distress Cardio: Normal S1 and S2, RRR. No murmurs or rubs.   Pulm: CTAB, normal WOB  Abdomen: Bowel sounds normal. Abdomen soft and non-tender.  Extremities: No peripheral edema. Warm/ well perfused.  Strong radial pulse. Right sided neglect. Right UE hypertonia, flexion contracture at elbow and fingers. UE 5/5 strength.   Brief Hospital Course:   Seizure-like activity Patient was admitted to hospital following a seizure-like episode.  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 Neurology were consulted who recommended increasing Lamictal to 125 mg twice daily and he should continue this on discharge. Patient also received 1 dose of Vimpat.  Neuro did not recommend MRI brain.  UTI UA showed small leukocytes and positive nitrites.  S/p ceftriaxone x1 dose.  Patient was switched to 7-day course of Bactrim per sensitivities from previous urine culture. He should continue Bactrim until 22nd March.   Type 2 diabetes CBGs in hospital 200-300 after stopping Metformin and Levemir. Patient was placed on sensitive sliding scale received bedtime coverage on one night. Can continue Metformin and Levemir on discharge.   Issues for Follow  Up:  1. Monitor CBGs after returning on home diabetic medications. 2. Continue Bactrim until 22nd March 3. Stop aspirin as patient has completed DAPT. Continue Clopidogrel 75mg .  4. Follow up with patient's neurologist outpatient   Significant Procedures:  None   Significant Labs and Imaging:  Recent Labs  Lab 07/15/19 1437 07/16/19 0223 07/17/19 0246  WBC 7.9 6.3 5.6  HGB 12.6* 12.8* 12.2*  HCT 40.0 40.1 37.1*  PLT 207 211 214   Recent Labs  Lab 07/15/19 1437 07/15/19 1437 07/16/19 0223 07/17/19 0246  NA 140  --  142 142  K 3.8   < > 4.0 3.8  CL 101  --  102 105  CO2 26  --  27 26  GLUCOSE 362*  --  340* 288*  BUN 33*  --  24* 20  CREATININE 1.25*  --  1.12 1.24  CALCIUM 9.2  --  9.5 9.5  ALKPHOS 72  --   --   --   AST 13*  --   --   --   ALT 22  --   --   --   ALBUMIN 3.2*  --   --   --    < > = values in this interval not displayed.     Results/Tests Pending at Time of Discharge:  Discharge Medications:  Allergies as of 07/18/2019      Reactions   Ciprofloxacin Anaphylaxis   Keppra [levetiracetam] Other (See Comments)   Makes the patient very agitated and "not like himself"   Metformin And Related Other (See Comments)   Renal issue   Other Nausea And Vomiting, Other (See Comments)   [place]      Medication List  STOP taking these medications   aspirin 325 MG EC tablet     TAKE these medications   Accu-Chek Aviva Plus test strip Generic drug: glucose blood daily.   amLODipine 10 MG tablet Commonly known as: NORVASC Take 1 tablet (10 mg total) by mouth daily.   ascorbic acid 500 MG tablet Commonly known as: VITAMIN C Take 1 tablet (500 mg total) by mouth daily as needed (if cold-like symptoms are present).   atorvastatin 40 MG tablet Commonly known as: LIPITOR Take 1 tablet (40 mg total) by mouth daily at 6 PM.   baclofen 10 MG tablet Commonly known as: LIORESAL Take 2 tablets (20 mg total) by mouth 2 (two) times daily.   calcium  citrate 950 (200 Ca) MG tablet Commonly known as: CALCITRATE - dosed in mg elemental calcium Take 0.5 tablets (475 mg total) by mouth daily.   carvedilol 12.5 MG tablet Commonly known as: COREG Take 1 tablet (12.5 mg total) by mouth 2 (two) times daily with a meal. What changed: additional instructions   clopidogrel 75 MG tablet Commonly known as: PLAVIX Take 1 tablet (75 mg total) by mouth daily.   indapamide 1.25 MG tablet Commonly known as: LOZOL Take 1 tablet (1.25 mg total) by mouth daily.   insulin detemir 100 UNIT/ML injection Commonly known as: LEVEMIR Inject 16 Units into the skin daily. If CBG >125 (per daughter)   lamoTRIgine 25 MG tablet Commonly known as: LAMICTAL Take 5 tablets (125 mg total) by mouth 2 (two) times daily. What changed:   medication strength  how much to take   lisinopril 20 MG tablet Commonly known as: ZESTRIL Take 1 tablet (20 mg total) by mouth 2 (two) times daily.   liver oil-zinc oxide 40 % ointment Commonly known as: DESITIN Apply topically 2 (two) times daily as needed for irritation (for protection).   pantoprazole 40 MG tablet Commonly known as: PROTONIX Take 1 tablet (40 mg total) by mouth daily.   sulfamethoxazole-trimethoprim 200-40 MG/5ML suspension Commonly known as: BACTRIM Take 20 mLs by mouth every 12 (twelve) hours for 4 days.   tamsulosin 0.4 MG Caps capsule Commonly known as: FLOMAX Take 1 capsule (0.4 mg total) by mouth daily after supper.   Vitamin D3 25 MCG tablet Commonly known as: Vitamin D Take 0.5 tablets (500 Units total) by mouth daily.       Discharge Instructions: Please refer to Patient Instructions section of EMR for full details.  Patient was counseled important signs and symptoms that should prompt return to medical care, changes in medications, dietary instructions, activity restrictions, and follow up appointments.   Follow-Up Appointments: Contact information for after-discharge care     Destination    [facility] Preferred SNF .   Service: Skilled Nursing Contact information: [address] [place] [address] [phone]              Towanda Octave, MD 07/18/2019, 12:04 PM PGY-1, [facility]

## 2019-07-16 NOTE — Progress Notes (Signed)
P.T./O.T. Verbalized they were not able to get in touch with patient family member on the number called this morning. Pt also when giving medication became aggressive towards staff and pushing hand at staff.

## 2019-07-17 DIAGNOSIS — E1142 Type 2 diabetes mellitus with diabetic polyneuropathy: Secondary | ICD-10-CM

## 2019-07-17 DIAGNOSIS — I6990 Unspecified sequelae of unspecified cerebrovascular disease: Secondary | ICD-10-CM

## 2019-07-17 DIAGNOSIS — I6930 Unspecified sequelae of cerebral infarction: Secondary | ICD-10-CM

## 2019-07-17 DIAGNOSIS — R4701 Aphasia: Secondary | ICD-10-CM

## 2019-07-17 DIAGNOSIS — I10 Essential (primary) hypertension: Secondary | ICD-10-CM

## 2019-07-17 DIAGNOSIS — I69351 Hemiplegia and hemiparesis following cerebral infarction affecting right dominant side: Secondary | ICD-10-CM

## 2019-07-17 DIAGNOSIS — G40909 Epilepsy, unspecified, not intractable, without status epilepticus: Secondary | ICD-10-CM

## 2019-07-17 DIAGNOSIS — E1159 Type 2 diabetes mellitus with other circulatory complications: Secondary | ICD-10-CM

## 2019-07-17 LAB — SARS CORONAVIRUS 2 (TAT 6-24 HRS): SARS Coronavirus 2: NEGATIVE

## 2019-07-17 LAB — CBC
HCT: 37.1 % — ABNORMAL LOW (ref 39.0–52.0)
Hemoglobin: 12.2 g/dL — ABNORMAL LOW (ref 13.0–17.0)
MCH: 30.2 pg (ref 26.0–34.0)
MCHC: 32.9 g/dL (ref 30.0–36.0)
MCV: 91.8 fL (ref 80.0–100.0)
Platelets: 214 10*3/uL (ref 150–400)
RBC: 4.04 MIL/uL — ABNORMAL LOW (ref 4.22–5.81)
RDW: 12.3 % (ref 11.5–15.5)
WBC: 5.6 10*3/uL (ref 4.0–10.5)
nRBC: 0 % (ref 0.0–0.2)

## 2019-07-17 LAB — BASIC METABOLIC PANEL
Anion gap: 11 (ref 5–15)
BUN: 20 mg/dL (ref 8–23)
CO2: 26 mmol/L (ref 22–32)
Calcium: 9.5 mg/dL (ref 8.9–10.3)
Chloride: 105 mmol/L (ref 98–111)
Creatinine, Ser: 1.24 mg/dL (ref 0.61–1.24)
GFR calc Af Amer: >60 mL/min (ref >60)
GFR calc non Af Amer: 59 mL/min — ABNORMAL LOW (ref >60)
Glucose, Bld: 288 mg/dL — ABNORMAL HIGH (ref 70–99)
Potassium: 3.8 mmol/L (ref 3.5–5.1)
Sodium: 142 mmol/L (ref 135–145)

## 2019-07-17 LAB — GLUCOSE, CAPILLARY
Glucose-Capillary: 219 mg/dL — ABNORMAL HIGH (ref 70–99)
Glucose-Capillary: 262 mg/dL — ABNORMAL HIGH (ref 70–99)
Glucose-Capillary: 253 mg/dL — ABNORMAL HIGH (ref 70–99)
Glucose-Capillary: 285 mg/dL — ABNORMAL HIGH (ref 70–99)

## 2019-07-17 NOTE — TOC Progression Note (Signed)
Transition of Care (TOC) - Progression Note    Patient Details  Name: Kendan Cornforth. MRN: [id number] Date of Birth: [birth year]  [facility] CM/SW Contact  Beckie Busing, RN Phone Number: 07/17/2019, 2:43 PM  Clinical Narrative:    Bed offer extended from [facility]. Daughter Cala Bradford notified to make aware of bed offer when patient is medically cleared for discharge. TOC will continue to follow.     Expected Discharge Plan: Skilled Nursing Facility Barriers to Discharge: Continued Medical Work up, SNF Pending bed offer  Expected Discharge Plan and Services Expected Discharge Plan: Skilled Nursing Facility In-house Referral: Clinical Social Work Discharge Planning Services: CM Consult Post Acute Care Choice: Skilled Nursing Facility                                         Social Determinants of Health (SDOH) Interventions    Readmission Risk Interventions No flowsheet data found.

## 2019-07-17 NOTE — Progress Notes (Signed)
Family Medicine Teaching Service Daily Progress Note Intern Pager: [phone]  Patient name: Douglas [name]. Medical record number: [id number] Date of birth: [birth year] Age: 69 y.o. Gender: male  Primary Care Provider: Jilda Panda, MD Consultants: Neurology Code Status: FULL   Pt Overview and Major Events to Date:  3/16: At baseline Assessment and Plan:  Altered mental status Appears at baseline per previous notes. 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  No acute intracranial abnormality.  EEG: left frontotemporal focal cerebral dysfunction in the setting of a more generalized nonspecific cerebral dysfunction (encephalopathy). no seizure or evidence of definite seizure predisposition -Neuro following, appreciate recommendations if patient becomes agitated with Keppra so he is currently on Lamictal, Increasing dose of Lamictal to 125 mg BID. Signed off on 3/16  -Seizure precautions -Vitals per routine -SNF placement, likely tomorrow   Questionable right shoulder subluxation -Monitor for pain -If seems to be in pain, would consider reduction  UTI  BPH with frequent UTIs Urine culture previously grew staph hemolyticus S/p CTX (3/15-3/16) -Continue Bactrim (3/15-) 7 day course  -Continue tamsulosin -Strict I's and O's  H/o CVA Patient has residual right-sided hemiplegia. At baseline, has residual right arm and leg weakness, speaks in one-word phrases, and communicates needs by pointing with his left hand. Neglect his right side and have right hemianopsia. -Continue home ASA and Plavix  Normocytic Anemia Hgb 12.6>12.8>12.2.  MCV 95.5.  Was 15.5 3 weeks ago.  No colonoscopy on file. - Monitor CBC -Consider outpatient colonoscopy  HTN BP 139/62 Home meds include amlodipine 10 mg daily, Coreg 12.5 mg twice daily, indapamide 1.25 mg daily, lisinopril 20 mg  BID.   -Continue home meds   Type 2 diabetes mellitus CBG: 219, 313, 369 Home medication includes Metformin 1000 mg twice daily.  Previous hemoglobin A1c was 6.7 in February -Holding home Metformin at this time -Glucose monitoring with meals and morning BMPs -sSSI started yesterday as CBGs were persistently elevated.  Night team started patient on bedtime coverage. D/c bedtime coverage. -Monitor CBGs  FEN/GI: Dysphagia 3 JEH:[id number]   Disposition: likely SNF 3/18  Subjective:  Appears well and comfortable.  Objective: Temp:  [97.9 F (36.6 C)-98.2 F (36.8 C)] 98.1 F (36.7 C) (03/17 0749) Pulse Rate:  [65-78] 65 (03/17 0749) Resp:  [19] 19 (03/17 0749) BP: (123-139)/(62-72) 139/62 (03/17 0749) SpO2:  [100 %] 100 % (03/17 0749)   Physical Exam:  General: Alert and cooperative and appears to be in no acute distress Cardio: Normal S1 and S2, RRR. No rubs or gallops  Pulm: CTAB, normal WOB. Abdomen: Bowel sounds normal. Abdomen soft and non-tender.  Extremities: No peripheral edema. Warm/ well perfused.  Strong radial pulse. Neuro: Cranial nerves grossly intact  Laboratory: Recent Labs  Lab 07/15/19 1437 07/16/19 0223 07/17/19 0246  WBC 7.9 6.3 5.6  HGB 12.6* 12.8* 12.2*  HCT 40.0 40.1 37.1*  PLT 207 211 214   Recent Labs  Lab 07/15/19 1437 07/16/19 0223 07/17/19 0246  NA 140 142 142  K 3.8 4.0 3.8  CL 101 102 105  CO2 26 27 26   BUN 33* 24* 20  CREATININE 1.25* 1.12 1.24  CALCIUM 9.2 9.5 9.5  PROT 6.6  --   --   BILITOT 1.1  --   --   ALKPHOS 72  --   --   ALT 22  --   --   AST  13*  --   --   GLUCOSE 362* 340* 288*      Imaging/Diagnostic Tests: No results found. Towanda Octave, MD 07/17/2019, 11:54 AM PGY-1, [facility] FPTS Intern pager: [phone], text pages welcome

## 2019-07-17 NOTE — Progress Notes (Signed)
Inpatient Diabetes Program Recommendations  AACE/ADA: New Consensus Statement on Inpatient Glycemic Control (2015)  Target Ranges:  Prepandial:   less than 140 mg/dL      Peak postprandial:   less than 180 mg/dL (1-2 hours)      Critically ill patients:  140 - 180 mg/dL   Lab Results  Component Value Date   GLUCAP 219 (H) 07/17/2019   HGBA1C 6.7 (H) 06/05/2019    Review of Glycemic Control Results for Douglas [name], Douglas [name] (MRN [id number]) as of 07/17/2019 10:30  Ref. Range 07/16/2019 11:56 07/16/2019 16:42 07/16/2019 20:57 07/17/2019 07:51  Glucose-Capillary Latest Ref Range: 70 - 99 mg/dL 016 (H) 429 (H) 037 (H) 219 (H)   Diabetes history: DM2 Outpatient Diabetes medications: Levemir 16 units QD if CBG is >125 + Novolog SSI (per daughter) Current orders for Inpatient glycemic control: Novolog 0-9 units TID, Novolog 0-5 units QHS  Inpatient Diabetes Program Recommendations:     Consider adding Levemir 10 units QD.   Thanks, Lujean Rave, MSN, RNC-OB Diabetes Coordinator [phone] (8a-5p)

## 2019-07-18 ENCOUNTER — Ambulatory Visit (INDEPENDENT_AMBULATORY_CARE_PROVIDER_SITE_OTHER): Payer: Medicare Other | Admitting: *Deleted

## 2019-07-18 DIAGNOSIS — I639 Cerebral infarction, unspecified: Secondary | ICD-10-CM | POA: Diagnosis not present

## 2019-07-18 LAB — URINE CULTURE: Culture: >=100000 — AB

## 2019-07-18 LAB — GLUCOSE, CAPILLARY
Glucose-Capillary: 297 mg/dL — ABNORMAL HIGH (ref 70–99)
Glucose-Capillary: 351 mg/dL — ABNORMAL HIGH (ref 70–99)

## 2019-07-18 MED ORDER — INSULIN DETEMIR 100 UNIT/ML ~~LOC~~ SOLN
10.0000 [IU] | Freq: Once | SUBCUTANEOUS | Status: AC
  Administered 2019-07-18: 10 [IU] via SUBCUTANEOUS
  Filled 2019-07-18: qty 0.1

## 2019-07-18 MED ORDER — LAMOTRIGINE 25 MG PO TABS: 125.0000 mg | ORAL_TABLET | Freq: Two times a day (BID) | ORAL | Status: DC

## 2019-07-18 MED ORDER — SULFAMETHOXAZOLE-TRIMETHOPRIM 200-40 MG/5ML PO SUSP: 20.0000 mL | Freq: Two times a day (BID) | ORAL | 0 refills | Status: AC

## 2019-07-18 MED ORDER — CARVEDILOL 12.5 MG PO TABS: 12.5000 mg | ORAL_TABLET | Freq: Two times a day (BID) | ORAL | 0 refills | Status: DC

## 2019-07-18 NOTE — Progress Notes (Signed)
Physical Therapy Treatment Patient Details Name: Douglas [name]. MRN: [id number] DOB: 04/22/1951 Today's Date: 07/18/2019    History of Present Illness  69 y.o. male presenting with altered mental status. PMH is significant for history of CVA with residual right-sided weakness, hypertension, type 2 diabetes, BPH with frequent UTIs.    PT Comments    Attempted to progress functional mobility today from bed<>chair. Pt performs supine<>sit with max A x 2 for LE/hip and trunk management. Pt has difficulty sitting EOB independently today and requires up to mod A due to R lateral lean. Per previous reports, daughter transfers pt into WC. Attempted stand pivot to chair however pt requiring total A x 2 and transfer unable to be completed due to pt pushing back against therapists. Pt is in no apparent distress. Pt demonstrates 3+/5 R LE strength however likely is nervous to transfer with therapists. Nursing staff educated on current pt's functional status and to use lift equipment for transfers for pt and staff safety. D/C plans remain appropriate. PT will continue to follow pt acutely.    Follow Up Recommendations  SNF;Supervision/Assistance - 24 hour     Equipment Recommendations  None recommended by PT       Precautions / Restrictions Precautions Precautions: Fall Precaution Comments: R shoulder sublux Restrictions Weight Bearing Restrictions: No    Mobility  Bed Mobility Overal bed mobility: Needs Assistance Bed Mobility: Supine to Sit;Sit to Supine     Supine to sit: Max assist;+2 for physical assistance Sit to supine: Max assist;+2 for physical assistance   General bed mobility comments: max A x 2 for LE/hip and trunk management  Transfers Overall transfer level: Needs assistance Equipment used: None Transfers: Sit to/from [facility] Sit to Stand: Total assist;+2 physical assistance Stand pivot transfers: Total assist;+2 physical assistance(attempted but  unable)       General transfer comment: Pt unable to fully transfer to standing and stand pivot unable to be   Ambulation/Gait             General Gait Details: unable       Balance Overall balance assessment: Needs assistance Sitting-balance support: Feet supported;Single extremity supported;No upper extremity supported Sitting balance-Leahy Scale: Poor Sitting balance - Comments: Pt has difficulty sitting upright EOB, he leans to the R and requires up to mod A to maintain upright sitting balance Postural control: Right lateral lean Standing balance support: Bilateral upper extremity supported Standing balance-Leahy Scale: Zero Standing balance comment: Unable to stand fully with total A x 2                            Cognition Arousal/Alertness: Awake/alert Behavior During Therapy: Flat affect Overall Cognitive Status: History of cognitive impairments - at baseline                                 General Comments: Pt able to follow ~25% of 1-step commands      Exercises General Exercises - Lower Extremity Long Arc Quad: Left;10 reps;AROM;Seated Hip Flexion/Marching: AROM;10 reps;Seated;Left Other Exercises Other Exercises: Trunk control working on sitting EOB without assistance. Min guard to mod A for balance. Sits EOB approximately 5 minutes.    General Comments General comments (skin integrity, edema, etc.): Vitals WNL.      Pertinent Vitals/Pain Pain Assessment: Faces Faces Pain Scale: No hurt  PT Goals (current goals can now be found in the care plan section) Acute Rehab PT Goals Patient Stated Goal: none stated PT Goal Formulation: Patient unable to participate in goal setting Time For Goal Achievement: 07/16/19 Potential to Achieve Goals: Fair Progress towards PT goals: Progressing toward goals    Frequency    Min 2X/week      PT Plan Current plan remains appropriate       AM-PAC PT "6 Clicks"  Mobility   Outcome Measure  Help needed turning from your back to your side while in a flat bed without using bedrails?: Total Help needed moving from lying on your back to sitting on the side of a flat bed without using bedrails?: Total Help needed moving to and from a bed to a chair (including a wheelchair)?: Total Help needed standing up from a chair using your arms (e.g., wheelchair or bedside chair)?: Total Help needed to walk in hospital room?: Total Help needed climbing 3-5 steps with a railing? : Total 6 Click Score: 6    End of Session Equipment Utilized During Treatment: Gait belt Activity Tolerance: Patient limited by fatigue;Other (comment)(Limited due to pushing back when attempting transfers) Patient left: in bed;with call bell/phone within reach;with bed alarm set Nurse Communication: Mobility status;Need for lift equipment PT Visit Diagnosis: Muscle weakness (generalized) (M62.81);Difficulty in walking, not elsewhere classified (R26.2);Hemiplegia and hemiparesis Hemiplegia - Right/Left: Right Hemiplegia - dominant/non-dominant: Non-dominant Hemiplegia - caused by: Cerebral infarction Pain - Right/Left: Right Pain - part of body: Shoulder     Time: 2119-4174 PT Time Calculation (min) (ACUTE ONLY): 39 min  Charges:  $Therapeutic Exercise: 8-22 mins $Therapeutic Activity: 23-37 mins                     Jodean Lima, PT, DPT Acute Rehabilitation Services Office [phone]   Jodean Lima 07/18/2019, 2:17 PM

## 2019-07-18 NOTE — Discharge Instructions (Signed)
Ms Douglas [name], Douglas [name] were admitted with a seizure. Neurology recommended increasing Lamictal to 125mg  twice daily. Please follow up with Neurology as an outpatient.   Best wishes,  Family Medicine team

## 2019-07-18 NOTE — Progress Notes (Signed)
Inpatient Diabetes Program Recommendations  AACE/ADA: New Consensus Statement on Inpatient Glycemic Control (2015)  Target Ranges:  Prepandial:   less than 140 mg/dL      Peak postprandial:   less than 180 mg/dL (1-2 hours)      Critically ill patients:  140 - 180 mg/dL   Lab Results  Component Value Date   GLUCAP 297 (H) 07/18/2019   HGBA1C 6.7 (H) 06/05/2019    Review of Glycemic Control Results for Douglas [name], Douglas [name] (MRN [id number]) as of 07/18/2019 11:30  Ref. Range 07/17/2019 12:26 07/17/2019 16:47 07/17/2019 20:29 07/18/2019 08:29  Glucose-Capillary Latest Ref Range: 70 - 99 mg/dL 257 (H) 505 (H) 183 (H) 297 (H)   Diabetes history:DM2 Outpatient Diabetes medications:Levemir 16 units QD if CBG is >125 + Novolog SSI (per daughter) Current orders for Inpatient glycemic control:Novolog 0-9 units TID, Novolog 0-5 units QHS  Inpatient Diabetes Program Recommendations:  If to remain inpatient consider adding Levemir 10 units QD as trends are exceeding inpatient goals. MD paged and discussed with team.   Thanks, Lujean Rave, MSN, RNC-OB Diabetes Coordinator [phone] (8a-5p)

## 2019-07-18 NOTE — TOC Transition Note (Addendum)
Transition of Care [facility]) - CM/SW Discharge Note   Patient Details  Name: Douglas [name]. MRN: [id number] Date of Birth: 1951/02/28  Transition of Care [facility]) CM/SW Contact:  Beckie Busing, RN Phone Number: 07/18/2019, 1:24 PM   Clinical Narrative:    Patient discharging to [facility] discharge packet at the desk. PTAR arranged for transport. Bedside nurse updated. Called daughter  Douglas [name] and made her aware of discharge.  Room # 513 Report # [phone]   Final next level of care: Skilled Nursing Facility Barriers to Discharge: No Barriers Identified   Patient Goals and CMS Choice   CMS Medicare.gov Compare Post Acute Care list provided to:: Patient Represenative (must comment)(daughter) Choice offered to / list presented to : Adult Children  Discharge Placement              Patient chooses bed at: [facility] Patient to be transferred to facility by: PTAR Name of family member notified: Douglas [name] Patient and family notified of of transfer: 07/18/19  Discharge Plan and Services In-house Referral: Clinical Social Work Discharge Planning Services: CM Consult Post Acute Care Choice: Skilled Nursing Facility                               Social Determinants of Health (SDOH) Interventions     Readmission Risk Interventions No flowsheet data found.

## 2019-07-19 ENCOUNTER — Encounter: Payer: Self-pay | Admitting: Internal Medicine

## 2019-07-19 ENCOUNTER — Non-Acute Institutional Stay (SKILLED_NURSING_FACILITY): Payer: Medicare Other | Admitting: Internal Medicine

## 2019-07-19 DIAGNOSIS — E1142 Type 2 diabetes mellitus with diabetic polyneuropathy: Secondary | ICD-10-CM

## 2019-07-19 DIAGNOSIS — N401 Enlarged prostate with lower urinary tract symptoms: Secondary | ICD-10-CM | POA: Diagnosis not present

## 2019-07-19 DIAGNOSIS — I69351 Hemiplegia and hemiparesis following cerebral infarction affecting right dominant side: Secondary | ICD-10-CM | POA: Diagnosis not present

## 2019-07-19 DIAGNOSIS — I10 Essential (primary) hypertension: Secondary | ICD-10-CM

## 2019-07-19 DIAGNOSIS — G40909 Epilepsy, unspecified, not intractable, without status epilepticus: Secondary | ICD-10-CM

## 2019-07-19 DIAGNOSIS — E785 Hyperlipidemia, unspecified: Secondary | ICD-10-CM

## 2019-07-19 DIAGNOSIS — G40209 Localization-related (focal) (partial) symptomatic epilepsy and epileptic syndromes with complex partial seizures, not intractable, without status epilepticus: Secondary | ICD-10-CM | POA: Diagnosis not present

## 2019-07-19 DIAGNOSIS — N138 Other obstructive and reflux uropathy: Secondary | ICD-10-CM

## 2019-07-19 DIAGNOSIS — E1159 Type 2 diabetes mellitus with other circulatory complications: Secondary | ICD-10-CM

## 2019-07-19 DIAGNOSIS — N3000 Acute cystitis without hematuria: Secondary | ICD-10-CM

## 2019-07-19 DIAGNOSIS — E1169 Type 2 diabetes mellitus with other specified complication: Secondary | ICD-10-CM

## 2019-07-19 LAB — CUP PACEART REMOTE DEVICE CHECK
Date Time Interrogation Session: 20210318180301
Implantable Pulse Generator Implant Date: 20201106

## 2019-07-19 NOTE — Progress Notes (Signed)
ILR Remote 

## 2019-07-19 NOTE — Progress Notes (Addendum)
: Provider:  Margit HanksAlexander, Joneisha Miles D., MD Location:  [facility] Room Number: 513-P Place of Service:  SNF ([phone])  PCP: Ralene OkMoreira, Roy, MD Patient Care Team: Ralene OkMoreira, Roy, MD as PCP - General (Internal Medicine)  Extended Emergency Contact Information Primary Emergency Contact: [facility]Kimberly Home Phone: [phone] Mobile Phone: [phone] Relation: Daughter     Allergies: Ciprofloxacin, Keppra [levetiracetam], Metformin and related, and Other  Chief Complaint  Patient presents with  . New Admit To SNF    New admission to [facility]    HPI: Patient is a 69 y.o. male with history of CVA with residual right-sided weakness, hypertension, type 2 diabetes mellitus, BPH with frequent UTIs, who [facility] for what appeared to be seizure like activity.  CT head was ordered showed generalized cerebral atrophy with stable findings consistent with chronic left PCA and left MCA territory infarcts.  Neurology was consulted who recommended increasing Lamictal to 125 mg twice daily and patient also received 1 dose of Vimpat.  Neurology did not recommend MRI of the brain.  UA also showed possible UTI.  Patient received Rocephin for 1 dose and was placed on Bactrim per sensitivities.  Patient is admitted to skilled nursing facility for OT/PT.  While at skilled nursing facility patient will be followed for hypertension treated with Norvasc Coreg, hyperlipidemia treated with Lipitor and diabetes treated with Levemir and lisinopril.   Past Medical History:  Diagnosis Date  . Acute blood loss anemia   . Acute embolic stroke (HCC)   . Acute lower UTI   . AMS (altered mental status) 06/05/2019  . Cerebral edema (HCC) 04/04/2018  . Diabetes 1.5, managed as type 2 (HCC)   . Diabetes mellitus without complication (HCC)   . History of CVA with residual deficit   . History of Dysphagia, post-stroke   . Hypertension   . Ischemic stroke (HCC)  03/05/2019  . Labile blood glucose   . 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 Renal insufficiency 04/04/2018  . Stroke (HCC)   . UTI (urinary tract infection) 03/05/2019    Past Surgical History:  Procedure Laterality Date  . IR PATIENT EVAL TECH 0-60 MINS  03/30/2018  . LOOP RECORDER INSERTION N/A 03/08/2019   Procedure: LOOP RECORDER INSERTION;  Surgeon: Regan Lemmingamnitz, Will Martin, MD;  Location: [facility] INVASIVE CV LAB;  Service: Cardiovascular;  Laterality: N/A;  . TEE WITHOUT CARDIOVERSION N/A 04/02/2018   Procedure: TRANSESOPHAGEAL ECHOCARDIOGRAM (TEE);  Surgeon: Chilton Siandolph, Tiffany, MD;  [facility];  Service: Cardiovascular;  Laterality: N/A;    Allergies as of 07/19/2019      Reactions   Ciprofloxacin Anaphylaxis   Keppra [levetiracetam] Other (See Comments)   Makes the patient very agitated and "not like himself"   Metformin And Related Other (See Comments)   Renal issue   Other Nausea And Vomiting, Other (See Comments)   [place]      Medication List       Accurate as of July 19, 2019 11:59 PM. If you have any questions, ask your nurse or doctor.        Accu-Chek Aviva Plus test strip Generic drug: glucose blood daily.   amLODipine 10 MG tablet Commonly known as: NORVASC Take 1 tablet (10 mg total) by mouth daily.   ascorbic acid 500 MG tablet Commonly known as: VITAMIN C Take 1 tablet (500 mg total) by mouth daily as needed (if cold-like symptoms are present).  atorvastatin 40 MG tablet Commonly known as: LIPITOR Take 1 tablet (40 mg total) by mouth daily at 6 PM.   baclofen 10 MG tablet Commonly known as: LIORESAL Take 2 tablets (20 mg total) by mouth 2 (two) times daily.   calcium citrate 950 (200 Ca) MG tablet Commonly known as: CALCITRATE - dosed in mg elemental calcium Take 0.5 tablets (475 mg total) by mouth daily.   carvedilol 12.5 MG tablet Commonly known as: COREG Take 1 tablet (12.5 mg total) by mouth 2 (two) times daily with  a meal.   clopidogrel 75 MG tablet Commonly known as: PLAVIX Take 1 tablet (75 mg total) by mouth daily.   feeding supplement (PRO-STAT SUGAR FREE 64) Liqd Take 30 mLs by mouth in the morning and at bedtime.   indapamide 1.25 MG tablet Commonly known as: LOZOL Take 1 tablet (1.25 mg total) by mouth daily.   insulin detemir 100 UNIT/ML injection Commonly known as: LEVEMIR Inject 16 Units into the skin daily. If CBG >125 (per daughter)   lamoTRIgine 25 MG tablet Commonly known as: LAMICTAL Take 5 tablets (125 mg total) by mouth 2 (two) times daily.   lisinopril 20 MG tablet Commonly known as: ZESTRIL Take 1 tablet (20 mg total) by mouth 2 (two) times daily.   liver oil-zinc oxide 40 % ointment Commonly known as: DESITIN Apply topically 2 (two) times daily as needed for irritation (for protection).   pantoprazole 40 MG tablet Commonly known as: PROTONIX Take 1 tablet (40 mg total) by mouth daily.   sulfamethoxazole-trimethoprim 200-40 MG/5ML suspension Commonly known as: BACTRIM Take 20 mLs by mouth every 12 (twelve) hours for 4 days.   tamsulosin 0.4 MG Caps capsule Commonly known as: FLOMAX Take 1 capsule (0.4 mg total) by mouth daily after supper.   Vitamin D3 25 MCG tablet Commonly known as: Vitamin D Take 0.5 tablets (500 Units total) by mouth daily.       No orders of the defined types were placed in this encounter.    There is no immunization history on file for this patient.  Social History   Tobacco Use  . Smoking status: Never Smoker  . Smokeless tobacco: Never Used  Substance Use Topics  . Alcohol use: Not Currently    Family history is   Family History  Problem Relation Age of Onset  . Hypertension Mother   . Hypertension Father       Review of Systems No GENERAL:  no fevers, fatigue, appetite changes SKIN: No itching, or rash EYES: No eye pain, redness, discharge EARS: No earache, tinnitus, change in hearing NOSE: No congestion,  drainage or bleeding  MOUTH/THROAT: No mouth or tooth pain, No sore throat RESPIRATORY: No cough, wheezing, SOB CARDIAC: No chest pain, palpitations, lower extremity edema  GI: No abdominal pain, No N/V/D or constipation, No heartburn or reflux  GU: No dysuria, frequency or urgency, or incontinence  MUSCULOSKELETAL: No unrelieved bone/joint pain NEUROLOGIC: No headache, dizziness or focal weakness PSYCHIATRIC: No c/o anxiety or sadness   Vitals:   07/19/19 1349  BP: 120/79  Pulse: 70  Resp: 18  Temp: (!) 97.3 F (36.3 C)    SpO2 Readings from Last 1 Encounters:  07/18/19 97%   Body mass index is 26.31 kg/m.     Physical Exam  GENERAL APPEARANCE: Alert,  No acute distress.  SKIN: No diaphoresis rash HEAD: Normocephalic, atraumatic  EYES: Conjunctiva/lids clear. Pupils round, reactive. EOMs intact.  EARS: External exam WNL, canals clear.  Hearing grossly normal.  NOSE: No deformity or discharge.  MOUTH/THROAT: Lips w/o lesions  RESPIRATORY: Breathing is even, unlabored. Lung sounds are clear   CARDIOVASCULAR: Heart RRR no murmurs, rubs or gallops. No peripheral edema.   GASTROINTESTINAL: Abdomen is soft, non-tender, not distended w/ normal bowel sounds. GENITOURINARY: Bladder non tender, not distended  MUSCULOSKELETAL: No abnormal joints or musculature NEUROLOGIC:  Cranial nerves 2-12 grossly intact; right-sided hemiparesis PSYCHIATRIC: Mood and affect appropriate to situation, no behavioral issues  Patient Active Problem List   Diagnosis Date Noted  . Shoulder subluxation, right, sequela 07/16/2019  . Altered mental status 07/15/2019  . 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 Late effects of CVA (cerebrovascular accident)   . Pressure injury of skin 06/08/2019  . History of loop recorder 03/08/2019  . 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 Global aphasia   . Type 2 diabetes mellitus with peripheral  neuropathy (HCC)   . Hypertension associated with diabetes (HCC)   . Hyperlipidemia associated with type 2 diabetes mellitus (HCC)   . History of Dysphagia, post-stroke       Labs reviewed: Basic Metabolic Panel:    Component Value Date/Time   NA 142 07/17/2019 0246   K 3.8 07/17/2019 0246   CL 105 07/17/2019 0246   CO2 26 07/17/2019 0246   GLUCOSE 288 (H) 07/17/2019 0246   BUN 20 07/17/2019 0246   CREATININE 1.24 07/17/2019 0246   CALCIUM 9.5 07/17/2019 0246   CALCIUM 9.3 06/05/2019 1822   PROT 6.6 07/15/2019 1437   ALBUMIN 3.2 (L) 07/15/2019 1437   AST 13 (L) 07/15/2019 1437   ALT 22 07/15/2019 1437   ALKPHOS 72 07/15/2019 1437   BILITOT 1.1 07/15/2019 1437   GFRNONAA 59 (L) 07/17/2019 0246   GFRAA >60 07/17/2019 0246    Recent Labs    06/05/19 1822 06/06/19 0441 07/15/19 1437 07/16/19 0223 07/17/19 0246  NA  --    < > 140 142 142  K  --    < > 3.8 4.0 3.8  CL  --    < > 101 102 105  CO2  --    < > 26 27 26   GLUCOSE  --    < > 362* 340* 288*  BUN  --    < > 33* 24* 20  CREATININE 0.90   < > 1.25* 1.12 1.24  CALCIUM 9.3   < > 9.2 9.5 9.5  MG 2.1  --   --   --   --   PHOS 2.9  --   --   --   --    < > = values in this interval not displayed.   Liver Function Tests: Recent Labs    06/06/19 0441 06/15/19 1945 07/15/19 1437  AST 24 24 13*  ALT 21 23 22   ALKPHOS 97 77 72  BILITOT 1.3* 0.7 1.1  PROT 6.7 6.6 6.6  ALBUMIN 4.0 3.6 3.2*   Recent Labs    06/05/19 1822  LIPASE 20   Recent Labs    06/16/19 0916  AMMONIA 21   CBC: Recent Labs    03/05/19 1302 03/05/19 1416 06/05/19 1352 06/05/19 1509 06/17/19 1321 06/18/19 0223 07/15/19 1437 07/16/19 0223 07/17/19 0246  WBC 7.8   < > 5.1   < > 5.7   < > 7.9 6.3 5.6  NEUTROABS 6.6  --  3.5  --  3.6  --   --   --   --  HGB 14.4   < > 14.3   < > 13.9   < > 12.6* 12.8* 12.2*  HCT 45.3   < > 44.4   < > 42.8   < > 40.0 40.1 37.1*  MCV 93.2   < > 93.3   < > 91.8   < > 95.5 92.2 91.8  PLT 239   <  > 195   < > 214   < > 207 211 214   < > = values in this interval not displayed.   Lipid Recent Labs    03/06/19 0741  CHOL 119  HDL 36*  LDLCALC 68  TRIG 76    Cardiac Enzymes: Recent Labs    01/20/19 2138  CKTOTAL 73   BNP: No results for input(s): BNP in the last 8760 hours. No results found for: [facility] Lab Results  Component Value Date   HGBA1C 6.7 (H) 06/05/2019   Lab Results  Component Value Date   TSH 1.120 06/05/2019   No results found for: VITAMINB12 No results found for: FOLATE No results found for: IRON, TIBC, FERRITIN  Imaging and Procedures obtained prior to SNF admission: DG Shoulder Right  Result Date: 07/15/2019 CLINICAL DATA:  Right shoulder pain EXAM: RIGHT SHOULDER - 2+ VIEW COMPARISON:  None. FINDINGS: Diffuse osseous demineralization. No fracture identified. Humeral head appears anteriorly subluxed relative to the glenoid on scapular Y-view. Degenerative changes of the glenohumeral and acromioclavicular joints. There is soft tissue swelling overlying the lateral aspect of the shoulder. Visualized right lung field is clear. IMPRESSION: 1. Humeral head appears anteriorly subluxed relative to the glenoid on scapular view, which may be projectional. With internal rotation, external rotation, and axillary views would be helpful to exclude a anterior shoulder dislocation, if there is adequate clinical concern. 2. No fracture identified. Electronically Signed   By: Davina Poke D.O.   On: 07/15/2019 17:07   CT Head Wo Contrast  Result Date: 07/15/2019 CLINICAL DATA:  Encephalopathy. EXAM: CT HEAD WITHOUT CONTRAST TECHNIQUE: Contiguous axial images were obtained from the base of the skull through the vertex without intravenous contrast. COMPARISON:  June 15, 2019 FINDINGS: Brain: There is moderate severity cerebral atrophy with widening of the extra-axial spaces and ventricular dilatation. There are areas of decreased attenuation within the white  matter tracts of the supratentorial brain, consistent with microvascular disease changes. A large, stable area of encephalomalacia, with adjacent chronic white matter low attenuation, is seen throughout the left cerebral hemisphere. Associated ex vacuo dilatation of the left lateral ventricle is seen. Vascular: No hyperdense vessel or unexpected calcification. Skull: Normal. Negative for fracture or focal lesion. Sinuses/Orbits: No acute finding. Other: None. IMPRESSION: 1. 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 2. No acute intracranial abnormality. Electronically Signed   By: Virgina Norfolk M.D.   On: 07/15/2019 16:40   DG Chest Port 1 View  Result Date: 07/15/2019 CLINICAL DATA:  Altered mental status. Unresponsive. EXAM: PORTABLE CHEST 1 VIEW COMPARISON:  Radiograph 03/05/2019, 04/03/2018 FINDINGS: Loop recorder projects over the left chest wall. Low lung volumes. The cardiomediastinal contours are normal. The lungs are clear. Pulmonary vasculature is normal. No consolidation, pleural effusion, or pneumothorax. No acute osseous abnormalities are seen. Density projecting over the right scapula is likely a bone island. IMPRESSION: Low lung volumes without acute abnormality. Electronically Signed   By: Keith Rake M.D.   On: 07/15/2019 14:48   EEG adult  Result Date: 07/15/2019 Leonel Ramsay,  Hardin Negus, MD     07/15/2019  7:04 PM History: 69 year old male with confusion after seizure Sedation: None Technique: This is a 21 channel routine scalp EEG performed at the bedside with bipolar and monopolar montages arranged in accordance to the international 10/20 system of electrode placement. One channel was dedicated to EKG recording. Background: There is a posterior dominant rhythm of 9 hz which is seen during maximal wakefulness.  There is intrusion of the background of irregular delta and theta range activity.  There does appear  to be some attenuation of faster frequencies on the left and a prominence of slow activity in left frontotemporal regions. Photic stimulation: Physiologic driving is not performed EEG Abnormalities: 1) left frontotemporal slowing 2) generalized irregular slow activity Clinical Interpretation: This EEG is consistent with a left frontotemporal focal cerebral dysfunction in the setting of a more generalized nonspecific cerebral dysfunction (encephalopathy). There was no seizure or evidence of definite seizure predisposition on this study, sharp waves seen previously were not seen today.  Please note that lack of epileptiform activity on EEG does not preclude the possibility of epilepsy. Ritta Slot, MD [facility] [phone] If 7pm- 7am, please page neurology on call as listed in AMION.     Not all labs, radiology exams or other studies done during hospitalization come through on my EPIC note; however they are reviewed by me.    Assessment and Plan  Seizure/status post CVA with right hemiparesis -history of seizure prior; CT shows generalized cerebral atrophy with stable findings consistent with chronic left ACA and left MCA territory infarcts; neurology recommended increasing Lamictal to 125 mg twice daily and patient did receive 1 dose of Vimpat; neurology did not recommend MRI brain SNF-patient admitted for OT/PT continue Lamictal 225 mg twice daily  UTI/BPH -UA showed small leukocytes and positive nitrites; status post Rocephin x1 dose and patient switched to 7-day course of Bactrim per sensitivities from a prior urine culture SNF-continue Bactrim, 200/40 mix per 5 mill 20 mils every 12 hours for 4 days for total of 7 days; continue Flomax 0.4 mg daily  Diabetes mellitus type 2-patient was on sliding scale insulin while in the hospital SNF-patient was restarted on Levemir 10 units nightly; patient blood sugars been running high and Levemir is increased to 16 units nightly;  patient was possibly restarted on Glucophage according to notes on discharge but Glucophage was not on the discharge summary; starting Glucophage 1000 mg twice daily; blood sugars were monitored; patient is on ACE and statin  Hypertension SNF-appears controlled; continue Norvasc 10 mg daily and Coreg 12.5 mg twice daily,  lisinopril 20 mg twice daily and indapamide 1.25 mg daily  Hyperlipidemia SNF-no status uncontrolled; continue Lipitor 40 mg daily   Time spent greater than 45 minutes;> 50% of time with patient was spent reviewing records, labs, tests and studies, counseling and developing plan of care  Margit Hanks, MD

## 2019-07-21 ENCOUNTER — Encounter: Payer: Self-pay | Admitting: Internal Medicine

## 2019-07-21 DIAGNOSIS — N138 Other obstructive and reflux uropathy: Secondary | ICD-10-CM | POA: Insufficient documentation

## 2019-07-21 DIAGNOSIS — G40909 Epilepsy, unspecified, not intractable, without status epilepticus: Secondary | ICD-10-CM | POA: Insufficient documentation

## 2019-07-22 LAB — CBC: RBC: 3.99 (ref 3.87–5.11)

## 2019-07-22 LAB — BASIC METABOLIC PANEL
BUN: 20 (ref 4–21)
CO2: 26 — AB (ref 13–22)
Chloride: 104 (ref 99–108)
Creatinine: 1.2 (ref 0.6–1.3)
Glucose: 227
Potassium: 3.8 (ref 3.4–5.3)
Sodium: 144 (ref 137–147)

## 2019-07-22 LAB — CBC AND DIFFERENTIAL
HCT: 35 — AB (ref 41–53)
Hemoglobin: 11.8 — AB (ref 13.5–17.5)
Neutrophils Absolute: 4
WBC: 5.9

## 2019-07-22 LAB — COMPREHENSIVE METABOLIC PANEL
Calcium: 9.4 (ref 8.7–10.7)
GFR calc Af Amer: 74.76
GFR calc non Af Amer: 64.5

## 2019-07-30 ENCOUNTER — Non-Acute Institutional Stay (SKILLED_NURSING_FACILITY): Payer: Medicare Other | Admitting: Internal Medicine

## 2019-07-30 ENCOUNTER — Encounter: Payer: Self-pay | Admitting: Internal Medicine

## 2019-07-30 DIAGNOSIS — I951 Orthostatic hypotension: Secondary | ICD-10-CM | POA: Diagnosis not present

## 2019-07-30 NOTE — Progress Notes (Signed)
Location:  [facility] Room Number: 513-P Place of Service:  SNF (31)  Ralene Ok, MD  Patient Care Team: Ralene Ok, MD as PCP - General (Internal Medicine)  Extended Emergency Contact Information Primary Emergency Contact: [facility] Phone: [phone] Mobile Phone: [phone] Relation: Daughter    Allergies: Ciprofloxacin, Keppra [levetiracetam], Metformin and related, and Other  Chief Complaint  Patient presents with  . Acute Visit    Patient seen for low sitting blood pressures    HPI: Patient is 69 y.o. male who the floor nurse asked me to see.  Patient faints when he has been sitting up in his chair for longer than an hour.  This is occurred at home prior to his admission to SNF as well.  Patient is patient is lay down his systolic blood pressure will be in the 130s.  Patient had a syncopal episode today just prior to my seeing him.  Past Medical History:  Diagnosis Date  . Acute blood loss anemia   . Acute embolic stroke (HCC)   . Acute lower UTI   . AMS (altered mental status) 06/05/2019  . Cerebral edema (HCC) 04/04/2018  . Diabetes 1.5, managed as type 2 (HCC)   . Diabetes mellitus without complication (HCC)   . History of CVA with residual deficit   . History of Dysphagia, post-stroke   . Hypertension   . Ischemic stroke (HCC) 03/05/2019  . Labile blood glucose   . 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 Renal insufficiency 04/04/2018  . Stroke (HCC)   . UTI (urinary tract infection) 03/05/2019    Past Surgical History:  Procedure Laterality Date  . IR PATIENT EVAL TECH 0-60 MINS  03/30/2018  . LOOP RECORDER INSERTION N/A 03/08/2019   Procedure: LOOP RECORDER INSERTION;  Surgeon: Regan Lemming, MD;  Location: [facility] INVASIVE CV LAB;  Service: Cardiovascular;  Laterality: N/A;  . TEE WITHOUT CARDIOVERSION N/A 04/02/2018   Procedure: TRANSESOPHAGEAL ECHOCARDIOGRAM (TEE);  Surgeon: [name], MD;  Location: [facility] ENDOSCOPY;  Service: Cardiovascular;  Laterality: N/A;    Allergies as of 07/30/2019      Reactions   Ciprofloxacin Anaphylaxis   Keppra [levetiracetam] Other (See Comments)   Makes the patient very agitated and "not like himself"   Metformin And Related Other (See Comments)   Renal issue   Other Nausea And Vomiting, Other (See Comments)   [place]      Medication List       Accurate as of July 30, 2019 11:59 PM. If you have any questions, ask your nurse or doctor.        Accu-Chek Aviva Plus test strip Generic drug: glucose blood daily.   amLODipine 10 MG tablet Commonly known as: NORVASC Take 1 tablet (10 mg total) by mouth daily.   ascorbic acid 500 MG tablet Commonly known as: VITAMIN C Take 1 tablet (500 mg total) by mouth daily as needed (if cold-like symptoms are present).   atorvastatin 40 MG tablet Commonly known as: LIPITOR Take 1 tablet (40 mg total) by mouth daily at 6 PM.   baclofen 10 MG tablet Commonly known as: LIORESAL Take 2 tablets (20 mg total) by mouth 2 (two) times daily.   calcium citrate 950 (200 Ca) MG tablet Commonly known as: CALCITRATE - dosed in mg elemental calcium Take 0.5 tablets (475 mg total) by mouth daily.   carvedilol 12.5 MG tablet Commonly known as: COREG Take 1 tablet (12.5 mg total)  by mouth 2 (two) times daily with a meal.   clopidogrel 75 MG tablet Commonly known as: PLAVIX Take 1 tablet (75 mg total) by mouth daily.   feeding supplement (PRO-STAT SUGAR FREE 64) Liqd Take 30 mLs by mouth in the morning and at bedtime.   indapamide 1.25 MG tablet Commonly known as: LOZOL Take 1 tablet (1.25 mg total) by mouth daily.   insulin detemir 100 UNIT/ML injection Commonly known as: LEVEMIR Inject 16 Units into the skin daily. If CBG >125 (per daughter)   lamoTRIgine 25 MG tablet Commonly known as: LAMICTAL Take 5 tablets (125 mg total) by mouth 2 (two) times daily.   lisinopril 20 MG  tablet Commonly known as: ZESTRIL Take 1 tablet (20 mg total) by mouth 2 (two) times daily.   liver oil-zinc oxide 40 % ointment Commonly known as: DESITIN Apply topically 2 (two) times daily as needed for irritation (for protection).   multivitamin with minerals tablet Take 1 tablet by mouth daily.   NovoLOG 100 UNIT/ML injection Generic drug: insulin aspart Inject 2-15 Units into the skin 4 (four) times daily -  before meals and at bedtime. SSI:  121-150 = 2 units; 151-200 = 3 units, 201-250 = 5 units, 251-300 = 8 units, 301-350 = 11 units, 351-300 = 15 units, greater than 400 = 15 units and call MD   pantoprazole 40 MG tablet Commonly known as: PROTONIX Take 1 tablet (40 mg total) by mouth daily.   tamsulosin 0.4 MG Caps capsule Commonly known as: FLOMAX Take 1 capsule (0.4 mg total) by mouth daily after supper.   Vitamin D3 25 MCG tablet Commonly known as: Vitamin D Take 0.5 tablets (500 Units total) by mouth daily.       No orders of the defined types were placed in this encounter.    There is no immunization history on file for this patient.  Social History   Tobacco Use  . Smoking status: Never Smoker  . Smokeless tobacco: Never Used  Substance Use Topics  . Alcohol use: Not Currently    Review of Systems cannot contribute much secondary to aphasia; as per history of present illness Per nursing    Vitals:   07/30/19 1635  BP: 133/75  Pulse: 78  Resp: 16  Temp: 98.2 F (36.8 C)   Body mass index is 26.31 kg/m. Physical Exam  GENERAL APPEARANCE: Alert,  No acute distress  SKIN: No diaphoresis rash HEENT: Unremarkable RESPIRATORY: Breathing is even, unlabored. Lung sounds are clear   CARDIOVASCULAR: Heart RRR no murmurs, rubs or gallops. No peripheral edema; patient systolic blood pressure lying in bed now is 135 GASTROINTESTINAL: Abdomen is soft, non-tender, not distended w/ normal bowel sounds.  GENITOURINARY: Bladder non tender, not distended    MUSCULOSKELETAL: No abnormal joints or musculature NEUROLOGIC: Cranial nerves 2-12 grossly intact.  Right-sided hemiparesis PSYCHIATRIC: Mood and affect appropriate to situation, no behavioral issues  Patient Active Problem List   Diagnosis Date Noted  . Seizure disorder (HCC) 07/21/2019  . 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 Shoulder subluxation, right, sequela 07/16/2019  . Altered mental status 07/15/2019  . 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 Late effects of CVA (cerebrovascular accident)   . Pressure injury of skin 06/08/2019  . History of loop recorder 03/08/2019  . UTI (urinary tract infection) 03/05/2019  . 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 Global aphasia   . Type  2 diabetes mellitus with peripheral neuropathy (HCC)   . Hypertension associated with diabetes (HCC)   . Hyperlipidemia associated with type 2 diabetes mellitus (HCC)   . History of Dysphagia, post-stroke     CMP     Component Value Date/Time   NA 144 07/22/2019 0000   K 3.8 07/22/2019 0000   CL 104 07/22/2019 0000   CO2 26 (A) 07/22/2019 0000   GLUCOSE 288 (H) 07/17/2019 0246   BUN 20 07/22/2019 0000   CREATININE 1.2 07/22/2019 0000   CREATININE 1.24 07/17/2019 0246   CALCIUM 9.4 07/22/2019 0000   CALCIUM 9.3 06/05/2019 1822   PROT 6.6 07/15/2019 1437   ALBUMIN 3.2 (L) 07/15/2019 1437   AST 13 (L) 07/15/2019 1437   ALT 22 07/15/2019 1437   ALKPHOS 72 07/15/2019 1437   BILITOT 1.1 07/15/2019 1437   GFRNONAA 64.50 07/22/2019 0000   GFRAA 74.76 07/22/2019 0000   Recent Labs    06/05/19 1822 06/06/19 0441 07/15/19 1437 07/15/19 1437 07/16/19 0223 07/17/19 0246 07/22/19 0000  NA  --    < > 140   < > 142 142 144  K  --    < > 3.8   < > 4.0 3.8 3.8  CL  --    < > 101   < > 102 105 104  CO2  --    < > 26   < > 27 26 26*  GLUCOSE  --    < > 362*  --  340* 288*  --   BUN  --    < > 33*   < > 24* 20  20  CREATININE 0.90   < > 1.25*   < > 1.12 1.24 1.2  CALCIUM 9.3   < > 9.2   < > 9.5 9.5 9.4  MG 2.1  --   --   --   --   --   --   PHOS 2.9  --   --   --   --   --   --    < > = values in this interval not displayed.   Recent Labs    06/06/19 0441 06/15/19 1945 07/15/19 1437  AST 24 24 13*  ALT 21 23 22   ALKPHOS 97 77 72  BILITOT 1.3* 0.7 1.1  PROT 6.7 6.6 6.6  ALBUMIN 4.0 3.6 3.2*   Recent Labs    06/05/19 1352 06/05/19 1509 06/17/19 1321 06/18/19 0223 07/15/19 1437 07/15/19 1437 07/16/19 0223 07/17/19 0246 07/22/19 0000  WBC 5.1   < > 5.7   < > 7.9   < > 6.3 5.6 5.9  NEUTROABS 3.5  --  3.6  --   --   --   --   --  4  HGB 14.3   < > 13.9   < > 12.6*   < > 12.8* 12.2* 11.8*  HCT 44.4   < > 42.8   < > 40.0   < > 40.1 37.1* 35*  MCV 93.3   < > 91.8   < > 95.5  --  92.2 91.8  --   PLT 195   < > 214   < > 207  --  211 214  --    < > = values in this interval not displayed.   Recent Labs    03/06/19 0741  CHOL 119  LDLCALC 68  TRIG 76   No results found for: [facility] Lab Results  Component Value Date  TSH 1.120 06/05/2019   Lab Results  Component Value Date   HGBA1C 6.7 (H) 06/05/2019   Lab Results  Component Value Date   CHOL 119 03/06/2019   HDL 36 (L) 03/06/2019   LDLCALC 68 03/06/2019   TRIG 76 03/06/2019   CHOLHDL 3.3 03/06/2019    Significant Diagnostic Results in last 30 days:  DG Shoulder Right  Result Date: 07/15/2019 CLINICAL DATA:  Right shoulder pain EXAM: RIGHT SHOULDER - 2+ VIEW COMPARISON:  None. FINDINGS: Diffuse osseous demineralization. No fracture identified. Humeral head appears anteriorly subluxed relative to the glenoid on scapular Y-view. Degenerative changes of the glenohumeral and acromioclavicular joints. There is soft tissue swelling overlying the lateral aspect of the shoulder. Visualized right lung field is clear. IMPRESSION: 1. Humeral head appears anteriorly subluxed relative to the glenoid on scapular view, which may be  projectional. With internal rotation, external rotation, and axillary views would be helpful to exclude a anterior shoulder dislocation, if there is adequate clinical concern. 2. No fracture identified. Electronically Signed   By: Duanne GuessNicholas  Plundo D.O.   On: 07/15/2019 17:07   CT Head Wo Contrast  Result Date: 07/15/2019 CLINICAL DATA:  Encephalopathy. EXAM: CT HEAD WITHOUT CONTRAST TECHNIQUE: Contiguous axial images were obtained from the base of the skull through the vertex without intravenous contrast. COMPARISON:  June 15, 2019 FINDINGS: Brain: There is moderate severity cerebral atrophy with widening of the extra-axial spaces and ventricular dilatation. There are areas of decreased attenuation within the white matter tracts of the supratentorial brain, consistent with microvascular disease changes. A large, stable area of encephalomalacia, with adjacent chronic white matter low attenuation, is seen throughout the left cerebral hemisphere. Associated ex vacuo dilatation of the left lateral ventricle is seen. Vascular: No hyperdense vessel or unexpected calcification. Skull: Normal. Negative for fracture or focal lesion. Sinuses/Orbits: No acute finding. Other: None. IMPRESSION: 1. 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 2. No acute intracranial abnormality. Electronically Signed   By: Aram Candelahaddeus  Houston M.D.   On: 07/15/2019 16:40   DG Chest Port 1 View  Result Date: 07/15/2019 CLINICAL DATA:  Altered mental status. Unresponsive. EXAM: PORTABLE CHEST 1 VIEW COMPARISON:  Radiograph 03/05/2019, 04/03/2018 FINDINGS: Loop recorder projects over the left chest wall. Low lung volumes. The cardiomediastinal contours are normal. The lungs are clear. Pulmonary vasculature is normal. No consolidation, pleural effusion, or pneumothorax. No acute osseous abnormalities are seen. Density projecting over the right scapula is likely a bone  island. IMPRESSION: Low lung volumes without acute abnormality. Electronically Signed   By: Narda RutherfordMelanie  Sanford M.D.   On: 07/15/2019 14:48   EEG adult  Result Date: 07/15/2019 Rejeana BrockKirkpatrick, McNeill P, MD     07/15/2019  7:04 PM History: 69 year old male with confusion after seizure Sedation: None Technique: This is a 21 channel routine scalp EEG performed at the bedside with bipolar and monopolar montages arranged in accordance to the international 10/20 system of electrode placement. One channel was dedicated to EKG recording. Background: There is a posterior dominant rhythm of 9 hz which is seen during maximal wakefulness.  There is intrusion of the background of irregular delta and theta range activity.  There does appear to be some attenuation of faster frequencies on the left and a prominence of slow activity in left frontotemporal regions. Photic stimulation: Physiologic driving is not performed EEG Abnormalities: 1) left frontotemporal slowing 2) generalized irregular slow activity Clinical Interpretation: This EEG is consistent with a  left frontotemporal focal cerebral dysfunction in the setting of a more generalized nonspecific cerebral dysfunction (encephalopathy). There was no seizure or evidence of definite seizure predisposition on this study, sharp waves seen previously were not seen today.  Please note that lack of epileptiform activity on EEG does not preclude the possibility of epilepsy. Roland Rack, MD [facility] [phone] If 7pm- 7am, please page neurology on call as listed in [place].   CUP PACEART REMOTE DEVICE CHECK  Result Date: 07/19/2019 Carelink summary report received. Battery status OK. Normal device function. No new symptom episodes, tachy episodes, brady, or pause episodes. No new AF episodes. Monthly summary reports and ROV/PRN Kathy Breach, RN, CCDS, [employer]   Assessment and Plan  Orthostatic hypotension-we will start midodrine 5 mg 3 times  daily and monitor    Hennie Duos, MD

## 2019-08-04 ENCOUNTER — Encounter: Payer: Self-pay | Admitting: Internal Medicine

## 2019-08-04 DIAGNOSIS — I951 Orthostatic hypotension: Secondary | ICD-10-CM | POA: Insufficient documentation

## 2019-08-21 ENCOUNTER — Encounter: Payer: Self-pay | Admitting: Internal Medicine

## 2019-08-21 ENCOUNTER — Non-Acute Institutional Stay (SKILLED_NURSING_FACILITY): Payer: Medicare Other | Admitting: Internal Medicine

## 2019-08-21 ENCOUNTER — Other Ambulatory Visit: Payer: Self-pay | Admitting: Family Medicine

## 2019-08-21 DIAGNOSIS — E1149 Type 2 diabetes mellitus with other diabetic neurological complication: Secondary | ICD-10-CM

## 2019-08-21 DIAGNOSIS — I6990 Unspecified sequelae of unspecified cerebrovascular disease: Secondary | ICD-10-CM | POA: Diagnosis not present

## 2019-08-21 DIAGNOSIS — E1159 Type 2 diabetes mellitus with other circulatory complications: Secondary | ICD-10-CM

## 2019-08-21 DIAGNOSIS — G40909 Epilepsy, unspecified, not intractable, without status epilepticus: Secondary | ICD-10-CM

## 2019-08-21 DIAGNOSIS — I10 Essential (primary) hypertension: Secondary | ICD-10-CM

## 2019-08-21 DIAGNOSIS — I951 Orthostatic hypotension: Secondary | ICD-10-CM

## 2019-08-21 NOTE — Progress Notes (Signed)
Location:    [place] Room Number: 585/I 77 Place of Service:  SNF [phone]) Provider:  Tretha Sciara, MD  Patient Care Team: Jilda Panda, MD as PCP - General (Internal Medicine)  Extended Emergency Contact Information Primary Emergency Contact: [facility] Phone: [phone] Mobile Phone: [phone] Relation: Daughter  Code Status:  Full Code Goals of care: Advanced Directive information Advanced Directives 08/21/2019  Does Patient Have a Medical Advance Directive? Yes  Type of Advance Directive (No Data)  Does patient want to make changes to medical advance directive? No - Patient declined  Copy of [place] in Chart? -  Would patient like information on creating a medical advance directive? -     Chief Complaint  Patient presents with  . Medical Management of Chronic Issues    Routine visit of medical management  Medical management of chronic medical conditions including history of CVA seizure disorder-type 2 diabetes-hypertension-complicated with orthostatic hypotension-as well as BPH-  HPI:  Pt is a 69 y.o. male seen today for medical management of chronic diseases.  As noted above.  Patient has a history of a recent CVA with right-sided weakness.--He was admitted to skilled nursing after hospitalization for what initially appeared to be seizure-like activity.  CT of the head showed generalized cerebral atrophy with stable findings consistent with chronic left PCA and left MCA territory infarcts.  Neurology recommended increasing his Lamictal to 225 mg twice daily and he also received a dose of Vimpat.  Neurology did not recommend an MRI of the brain.  UA also showed a possible UTI and he was placed on Bactrim per sensitivities.  He was also seen recently for orthostatic hypotension and was started on midodrine-Per staff this appears to be helping.  In regards to his other issues he does  have a history of hypertension he is on Norvasc 10 mg a day as well as Coreg 12.5 mg twice daily and lisinopril 20 mg grams twice daily again he does have midodrine for the orthostatic issues.  Regards to diabetes he is on Levemir 16 units a day as well as NovoLog sliding scale if CBG is greater than 125.  Blood sugars in the morning appear to be more in the higher 100s later in the day at times there are readings ranging from the 200s up to the 400s.  It appears there was recommendation for Glucophage per Dr. Sheppard Coil but will await her input on that.  Currently he is resting in bed comfortably nursing is trying to encourage p.o. intake as much as possible.  He remains fairly nonverbal he does attempt to follow verbal commands.       Past Medical History:  Diagnosis Date  . Acute blood loss anemia   . Acute embolic stroke ([place])   . Acute lower UTI   . AMS (altered mental status) 06/05/2019  . Cerebral edema ([place]) 04/04/2018  . Diabetes 1.5, managed as type 2 ([place])   . Diabetes mellitus without complication ([place])   . History of CVA with residual deficit   . History of Dysphagia, post-stroke   . Hypertension   . Ischemic stroke ([place]) 03/05/2019  . Labile blood glucose   . 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 Renal insufficiency 04/04/2018  . Stroke ([place])   . UTI (urinary tract infection) 03/05/2019   Past Surgical History:  Procedure Laterality Date  . IR PATIENT EVAL TECH 0-60 MINS  03/30/2018  .  LOOP RECORDER INSERTION N/A 03/08/2019   Procedure: LOOP RECORDER INSERTION;  Surgeon: Regan Lemming, MD;  Location: [facility] INVASIVE CV LAB;  Service: Cardiovascular;  Laterality: N/A;  . TEE WITHOUT CARDIOVERSION N/A 04/02/2018   Procedure: TRANSESOPHAGEAL ECHOCARDIOGRAM (TEE);  Surgeon: [name], MD;  Location: [facility] ENDOSCOPY;  Service: Cardiovascular;  Laterality: N/A;    Allergies  Allergen Reactions  . Ciprofloxacin Anaphylaxis  . Keppra  [Levetiracetam] Other (See Comments)    Makes the patient very agitated and "not like himself"  . Metformin And Related Other (See Comments)    Renal issue  . Other Nausea And Vomiting and Other (See Comments)    [place]    Allergies as of 08/21/2019      Reactions   Ciprofloxacin Anaphylaxis   Keppra [levetiracetam] Other (See Comments)   Makes the patient very agitated and "not like himself"   Metformin And Related Other (See Comments)   Renal issue   Other Nausea And Vomiting, Other (See Comments)   [place]      Medication List       Accurate as of August 21, 2019  4:11 PM. If you have any questions, ask your nurse or doctor.        STOP taking these medications   Accu-Chek Aviva Plus test strip Generic drug: glucose blood Stopped by: Edmon Crape, PA-C     TAKE these medications   amLODipine 10 MG tablet Commonly known as: NORVASC Take 1 tablet (10 mg total) by mouth daily.   ascorbic acid 500 MG tablet Commonly known as: VITAMIN C Take 1 tablet (500 mg total) by mouth daily as needed (if cold-like symptoms are present).   atorvastatin 40 MG tablet Commonly known as: LIPITOR Take 1 tablet (40 mg total) by mouth daily at 6 PM.   baclofen 10 MG tablet Commonly known as: LIORESAL Take 2 tablets (20 mg total) by mouth 2 (two) times daily.   calcium citrate 950 (200 Ca) MG tablet Commonly known as: CALCITRATE - dosed in mg elemental calcium Take 0.5 tablets (475 mg total) by mouth daily.   carvedilol 12.5 MG tablet Commonly known as: COREG Take 1 tablet (12.5 mg total) by mouth 2 (two) times daily with a meal.   clopidogrel 75 MG tablet Commonly known as: PLAVIX Take 1 tablet (75 mg total) by mouth daily.   feeding supplement (PRO-STAT SUGAR FREE 64) Liqd Take 30 mLs by mouth in the morning and at bedtime.   indapamide 1.25 MG tablet Commonly known as: LOZOL Take 1 tablet (1.25 mg total) by mouth daily.   insulin detemir 100 UNIT/ML injection  Commonly known as: LEVEMIR Inject 16 Units into the skin daily. If CBG >125 (per daughter)   lamoTRIgine 25 MG tablet Commonly known as: LAMICTAL Take 125 mg by mouth 2 (two) times daily. Five tablets What changed: Another medication with the same name was removed. Continue taking this medication, and follow the directions you see here. Changed by: Edmon Crape, PA-C   lisinopril 20 MG tablet Commonly known as: ZESTRIL Take 1 tablet (20 mg total) by mouth 2 (two) times daily.   liver oil-zinc oxide 40 % ointment Commonly known as: DESITIN Apply topically 2 (two) times daily as needed for irritation (for protection).   midodrine 5 MG tablet Commonly known as: PROAMATINE Take 5 mg by mouth in the morning, at noon, and at bedtime. FOR ORTHOSTATIC HYPOTENSION   multivitamin with minerals tablet Take 1 tablet by mouth daily.   NON  FORMULARY DIET :Mechanical soft NAS CCD with Nectar Thickened liquid   NovoLOG 100 UNIT/ML injection Generic drug: insulin aspart Inject 2-15 Units into the skin 4 (four) times daily -  before meals and at bedtime. SSI:  121-150 = 2 units; 151-200 = 3 units, 201-250 = 5 units, 251-300 = 8 units, 301-350 = 11 units, 351-300 = 15 units, greater than 400 = 15 units and call MD   pantoprazole 40 MG tablet Commonly known as: PROTONIX Take 1 tablet (40 mg total) by mouth daily.   tamsulosin 0.4 MG Caps capsule Commonly known as: FLOMAX Take 1 capsule (0.4 mg total) by mouth daily after supper.   Vitamin D3 25 MCG tablet Commonly known as: Vitamin D Take 0.5 tablets (500 Units total) by mouth daily.       Review of Systems   Is difficult to obtain secondary to patient not speaking much-nursing does not report any acute issues-they are encouraging p.o. intake-apparently no complaints of pain shortness of breath vomiting nausea diarrhea have been noted.  He appears to be resting comfortably  Immunization History  Administered Date(s) Administered  .  Influenza-Unspecified 01/01/2019  . Pneumococcal-Unspecified 12/31/2017   Pertinent  Health Maintenance Due  Topic Date Due  . FOOT EXAM  Never done  . OPHTHALMOLOGY EXAM  Never done  . COLONOSCOPY  Never done  . PNA vac Low Risk Adult (2 of 2 - PCV13) 01/01/2019  . INFLUENZA VACCINE  12/01/2019  . HEMOGLOBIN A1C  12/03/2019   No flowsheet data found. Functional Status Survey:    Vitals:   08/21/19 1554  BP: 113/72  Pulse: 74  Resp: 18  Temp: 98 F (36.7 C)  TempSrc: Oral  SpO2: 97%  Weight: 172 lb (78 kg)  Height: 6' (1.829 m)   Body mass index is 23.33 kg/m. Physical Exam   In general this is a fairly well-nourished elderly male in no distress lying what appears to be comfortably in bed.  His skin is warm and dry.  Eyes visual acuity appears to be intact he does make eye contact.  Oropharynx appear to be clear.  Chest was clear to auscultation there is no labored breathing.  Heart is regular rate and rhythm without murmur gallop or rub he does not have significant lower extremity edema.  Abdomen is soft nontender with positive bowel sounds.  Musculoskeletal does hold his right arm in somewhat of a semicontracted position he also has right leg stiffness appears able to move his left upper and lower extremities.  Neurologic as noted above he is not really speaking much he does have right-sided hemiparesis.  Psych again difficult to fully evaluate since he does not speak much  Labs reviewed: Recent Labs    06/05/19 1822 06/06/19 0441 07/15/19 1437 07/15/19 1437 07/16/19 0223 07/17/19 0246 07/22/19 0000  NA  --    < > 140   < > 142 142 144  K  --    < > 3.8   < > 4.0 3.8 3.8  CL  --    < > 101   < > 102 105 104  CO2  --    < > 26   < > 27 26 26*  GLUCOSE  --    < > 362*  --  340* 288*  --   BUN  --    < > 33*   < > 24* 20 20  CREATININE 0.90   < > 1.25*   < > 1.12  1.24 1.2  CALCIUM 9.3   < > 9.2   < > 9.5 9.5 9.4  MG 2.1  --   --   --   --   --   --    PHOS 2.9  --   --   --   --   --   --    < > = values in this interval not displayed.   Recent Labs    06/06/19 0441 06/15/19 1945 07/15/19 1437  AST 24 24 13*  ALT 21 23 22   ALKPHOS 97 77 72  BILITOT 1.3* 0.7 1.1  PROT 6.7 6.6 6.6  ALBUMIN 4.0 3.6 3.2*   Recent Labs    06/05/19 1352 06/05/19 1509 06/17/19 1321 06/18/19 0223 07/15/19 1437 07/15/19 1437 07/16/19 0223 07/17/19 0246 07/22/19 0000  WBC 5.1   < > 5.7   < > 7.9   < > 6.3 5.6 5.9  NEUTROABS 3.5  --  3.6  --   --   --   --   --  4  HGB 14.3   < > 13.9   < > 12.6*   < > 12.8* 12.2* 11.8*  HCT 44.4   < > 42.8   < > 40.0   < > 40.1 37.1* 35*  MCV 93.3   < > 91.8   < > 95.5  --  92.2 91.8  --   PLT 195   < > 214   < > 207  --  211 214  --    < > = values in this interval not displayed.   Lab Results  Component Value Date   TSH 1.120 06/05/2019   Lab Results  Component Value Date   HGBA1C 6.7 (H) 06/05/2019   Lab Results  Component Value Date   CHOL 119 03/06/2019   HDL 36 (L) 03/06/2019   LDLCALC 68 03/06/2019   TRIG 76 03/06/2019   CHOLHDL 3.3 03/06/2019    Significant Diagnostic Results in last 30 days:  No results found.  Assessment/Plan  #1-history of CVA-with right-sided hemiparesis he continues on Plavix 75 mg a day back from 20 mg twice daily is on a mechanical soft honey thick liquid diet-staff is encouraging p.o. intake.  At this point continue supportive care and rehab as tolerated.  2.-History of orthostatic hypotensionshe has been started on midodrine 5 mg 3 times daily at this point will monitor recent blood pressures show systolics ranging from 113 to the 140s.  3.  Hypertension-he continues on Norvasc 10 mg a day Coreg 12.5 mg twice daily as well as lisinopril 20 mg twice daily--also on indapamide 1.25 mg a day -as noted above systolics range from the 1 13-1 40s at this point will monitor.  4.  History of UTI this was treated in the hospital with Rocephin and then Bactrim.   5.-History of diabetes he is on Levemir 16 units a day as well as a sliding scale if CBG is greater than 125-as noted above blood sugars in the morning appear to be more in the higher 100s later in the day and more variability with readings at times in the 3-400 range-Dr. 13/08/2018 has considered adding Glucophage will await her assessment.  And most likely will be restarted  6.  History of hyperlipidemia he continues on atorvastatin 40 mg a day--most recent LDL was 68.  7.  History of BPH he continues on Flomax 0.4 mg a day apparently this is been stable.  8.  History of seizure disorder he is on Lamictal this is been increased to 125 mg p.o. twice daily-at this point appears to be stable.   We will update a CBC metabolic panel as well as hemoglobin A1c.  For updated values.  [id number]

## 2019-08-27 ENCOUNTER — Encounter: Payer: Self-pay | Admitting: Internal Medicine

## 2019-08-30 ENCOUNTER — Encounter: Payer: Self-pay | Admitting: Internal Medicine

## 2019-08-30 ENCOUNTER — Non-Acute Institutional Stay (SKILLED_NURSING_FACILITY): Payer: Medicare Other | Admitting: Internal Medicine

## 2019-08-30 DIAGNOSIS — N401 Enlarged prostate with lower urinary tract symptoms: Secondary | ICD-10-CM | POA: Diagnosis not present

## 2019-08-30 DIAGNOSIS — I6990 Unspecified sequelae of unspecified cerebrovascular disease: Secondary | ICD-10-CM | POA: Diagnosis not present

## 2019-08-30 DIAGNOSIS — G40209 Localization-related (focal) (partial) symptomatic epilepsy and epileptic syndromes with complex partial seizures, not intractable, without status epilepticus: Secondary | ICD-10-CM

## 2019-08-30 DIAGNOSIS — I10 Essential (primary) hypertension: Secondary | ICD-10-CM

## 2019-08-30 DIAGNOSIS — I69351 Hemiplegia and hemiparesis following cerebral infarction affecting right dominant side: Secondary | ICD-10-CM | POA: Diagnosis not present

## 2019-08-30 DIAGNOSIS — E1169 Type 2 diabetes mellitus with other specified complication: Secondary | ICD-10-CM

## 2019-08-30 DIAGNOSIS — I152 Hypertension secondary to endocrine disorders: Secondary | ICD-10-CM

## 2019-08-30 DIAGNOSIS — I6930 Unspecified sequelae of cerebral infarction: Secondary | ICD-10-CM

## 2019-08-30 DIAGNOSIS — N3000 Acute cystitis without hematuria: Secondary | ICD-10-CM

## 2019-08-30 DIAGNOSIS — E1142 Type 2 diabetes mellitus with diabetic polyneuropathy: Secondary | ICD-10-CM

## 2019-08-30 DIAGNOSIS — E1159 Type 2 diabetes mellitus with other circulatory complications: Secondary | ICD-10-CM

## 2019-08-30 DIAGNOSIS — E785 Hyperlipidemia, unspecified: Secondary | ICD-10-CM

## 2019-08-30 DIAGNOSIS — N138 Other obstructive and reflux uropathy: Secondary | ICD-10-CM

## 2019-08-30 MED ORDER — INDAPAMIDE 1.25 MG PO TABS: 1.2500 mg | ORAL_TABLET | Freq: Every day | ORAL | 0 refills | Status: DC

## 2019-08-30 MED ORDER — PRO-STAT SUGAR FREE PO LIQD: 30.0000 mL | Freq: Two times a day (BID) | ORAL | 1 refills | Status: DC

## 2019-08-30 MED ORDER — CARVEDILOL 12.5 MG PO TABS: 12.5000 mg | ORAL_TABLET | Freq: Two times a day (BID) | ORAL | 0 refills | Status: DC

## 2019-08-30 MED ORDER — MIDODRINE HCL 5 MG PO TABS: 5.0000 mg | ORAL_TABLET | Freq: Three times a day (TID) | ORAL | 0 refills | Status: DC

## 2019-08-30 MED ORDER — TAMSULOSIN HCL 0.4 MG PO CAPS: 0.4000 mg | ORAL_CAPSULE | Freq: Every day | ORAL | 0 refills | Status: DC

## 2019-08-30 MED ORDER — PANTOPRAZOLE SODIUM 40 MG PO TBEC: 40.0000 mg | DELAYED_RELEASE_TABLET | Freq: Every day | ORAL | 0 refills | Status: DC

## 2019-08-30 MED ORDER — AMLODIPINE BESYLATE 10 MG PO TABS: 10.0000 mg | ORAL_TABLET | Freq: Every day | ORAL | 0 refills | Status: DC

## 2019-08-30 MED ORDER — LISINOPRIL 20 MG PO TABS: 20.0000 mg | ORAL_TABLET | Freq: Two times a day (BID) | ORAL | 0 refills | Status: DC

## 2019-08-30 MED ORDER — LAMOTRIGINE 25 MG PO TABS: 125.0000 mg | ORAL_TABLET | Freq: Two times a day (BID) | ORAL | 0 refills | Status: DC

## 2019-08-30 MED ORDER — INSULIN DETEMIR 100 UNIT/ML ~~LOC~~ SOLN: 16.0000 [IU] | Freq: Every day | SUBCUTANEOUS | 0 refills | Status: DC

## 2019-08-30 MED ORDER — ZINC OXIDE 40 % EX OINT: TOPICAL_OINTMENT | Freq: Two times a day (BID) | CUTANEOUS | 0 refills | Status: DC | PRN

## 2019-08-30 MED ORDER — CLOPIDOGREL BISULFATE 75 MG PO TABS: 75.0000 mg | ORAL_TABLET | Freq: Every day | ORAL | 0 refills | Status: DC

## 2019-08-30 MED ORDER — ASCORBIC ACID 500 MG PO TABS: 500.0000 mg | ORAL_TABLET | Freq: Every day | ORAL | 0 refills | Status: AC | PRN

## 2019-08-30 MED ORDER — VITAMIN D3 25 MCG PO TABS: 500.0000 [IU] | ORAL_TABLET | Freq: Every day | ORAL | 0 refills | Status: AC

## 2019-08-30 MED ORDER — BACLOFEN 10 MG PO TABS: 20.0000 mg | ORAL_TABLET | Freq: Two times a day (BID) | ORAL | 0 refills | Status: DC

## 2019-08-30 MED ORDER — INSULIN ASPART 100 UNIT/ML ~~LOC~~ SOLN: 2.0000 [IU] | Freq: Three times a day (TID) | SUBCUTANEOUS | 0 refills | Status: DC

## 2019-08-30 MED ORDER — CALCIUM CITRATE 950 (200 CA) MG PO TABS: 500.0000 mg | ORAL_TABLET | Freq: Every day | ORAL | 0 refills | Status: DC

## 2019-08-30 NOTE — Progress Notes (Signed)
Location:  [place] Room Number: 102-P Place of Service:  SNF ([id number])SNF  PCP: Jilda Panda, MD Patient Care Team: Jilda Panda, MD as PCP - General (Internal Medicine)  Extended Emergency Contact Information Primary Emergency Contact: [facility] Phone: [phone] Mobile Phone: [phone] Relation: Daughter  Allergies  Allergen Reactions  . Ciprofloxacin Anaphylaxis  . Keppra [Levetiracetam] Other (See Comments)    Makes the patient very agitated and "not like himself"  . Metformin And Related Other (See Comments)    Renal issue  . Other Nausea And Vomiting and Other (See Comments)    [place]  Parts 1  Chief Complaint  Patient presents with  . Discharge Note    Patient is seen for discharge home 08/30/19.    HPI:  69 y.o. male with history of CVA with residual right-sided weakness, hypertension, diabetes mellitus type 2, BPH with frequent UTIs, who was admitted to [facility] from 3/15-18 for what appeared to be seizure-like activity.  CT head showed generalized cerebral atrophy with stable findings consistent with chronic left PCA and left MCA territory infarcts.  Neurology was consulted who recommended increasing patient's Lamictal to 125 mg twice daily and patient also received 1 dose of Vimpat.  Neurology did not recommend MRI of the brain.  UA also showed possible UTI.  Patient received Rocephin for 1 dose and was placed on Bactrim per sensitivities from the urine culture.  Patient was admitted to skilled nursing facility for OT/PT and is now ready to be discharged to home with his daughter.    Past Medical History:  Diagnosis Date  . Acute blood loss anemia   . Acute embolic stroke ([place])   . Acute lower UTI   . AMS (altered mental status) 06/05/2019  . Cerebral edema ([place]) 04/04/2018  . Diabetes 1.5, managed as type 2 ([place])   . Diabetes mellitus without complication ([place])   . History of CVA with residual  deficit   . History of Dysphagia, post-stroke   . Hypertension   . Ischemic stroke ([place]) 03/05/2019  . Labile blood glucose   . 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 Renal insufficiency 04/04/2018  . Stroke ([place])   . UTI (urinary tract infection) 03/05/2019    Past Surgical History:  Procedure Laterality Date  . IR PATIENT EVAL TECH 0-60 MINS  03/30/2018  . LOOP RECORDER INSERTION N/A 03/08/2019   Procedure: LOOP RECORDER INSERTION;  Surgeon: Constance Haw, MD;  Location: [facility] CV LAB;  Service: Cardiovascular;  Laterality: N/A;  . TEE WITHOUT CARDIOVERSION N/A 04/02/2018   Procedure: TRANSESOPHAGEAL ECHOCARDIOGRAM (TEE);  Surgeon: Skeet Latch, MD;  Location: [facility] ENDOSCOPY;  Service: Cardiovascular;  Laterality: N/A;     reports that he has never smoked. He has never used smokeless tobacco. He reports previous alcohol use. He reports that he does not use drugs. Social History   Socioeconomic History  . Marital status: Single    Spouse name: Not on file  . Number of children: Not on file  . Years of education: Not on file  . Highest education level: Not on file  Occupational History  . Not on file  Tobacco Use  . Smoking status: Never Smoker  . Smokeless tobacco: Never Used  Substance and Sexual Activity  . Alcohol use: Not Currently  . Drug use: Never  . Sexual activity: Not Currently  Other Topics Concern  . Not on file  Social History Narrative  .  Not on file   Social Determinants of Health   Financial Resource Strain:   . Difficulty of Paying Living Expenses:   Food Insecurity:   . Worried About Programme researcher, broadcasting/film/video in the Last Year:   . Barista in the Last Year:   Transportation Needs:   . Freight forwarder (Medical):   Marland Kitchen Lack of Transportation (Non-Medical):   Physical Activity:   . Days of Exercise per Week:   . Minutes of Exercise per Session:   Stress:   . Feeling of Stress :   Social Connections:   .  Frequency of Communication with Friends and Family:   . Frequency of Social Gatherings with Friends and Family:   . Attends Religious Services:   . Active Member of Clubs or Organizations:   . Attends Banker Meetings:   Marland Kitchen Marital Status:   Intimate Partner Violence:   . Fear of Current or Ex-Partner:   . Emotionally Abused:   Marland Kitchen Physically Abused:   . Sexually Abused:     Pertinent  Health Maintenance Due  Topic Date Due  . FOOT EXAM  Never done  . OPHTHALMOLOGY EXAM  Never done  . COLONOSCOPY  Never done  . PNA vac Low Risk Adult (2 of 2 - PCV13) 01/01/2019  . INFLUENZA VACCINE  12/01/2019  . HEMOGLOBIN A1C  12/03/2019    Medications: Allergies as of 08/30/2019      Reactions   Ciprofloxacin Anaphylaxis   Keppra [levetiracetam] Other (See Comments)   Makes the patient very agitated and "not like himself"   Metformin And Related Other (See Comments)   Renal issue   Other Nausea And Vomiting, Other (See Comments)   [place]      Medication List       Accurate as of August 30, 2019 11:59 PM. If you have any questions, ask your nurse or doctor.        amLODipine 10 MG tablet Commonly known as: NORVASC Take 1 tablet (10 mg total) by mouth daily.   ascorbic acid 500 MG tablet Commonly known as: VITAMIN C Take 1 tablet (500 mg total) by mouth daily as needed (if cold-like symptoms are present).   atorvastatin 40 MG tablet Commonly known as: LIPITOR Take 1 tablet (40 mg total) by mouth daily at 6 PM.   baclofen 10 MG tablet Commonly known as: LIORESAL Take 2 tablets (20 mg total) by mouth 2 (two) times daily.   calcium citrate 950 (200 Ca) MG tablet Commonly known as: CALCITRATE - dosed in mg elemental calcium Take 0.5 tablets (475 mg total) by mouth daily.   carvedilol 12.5 MG tablet Commonly known as: COREG Take 1 tablet (12.5 mg total) by mouth 2 (two) times daily with a meal.   clopidogrel 75 MG tablet Commonly known as: PLAVIX Take 1 tablet  (75 mg total) by mouth daily.   feeding supplement (PRO-STAT SUGAR FREE 64) Liqd Take 30 mLs by mouth in the morning and at bedtime.   indapamide 1.25 MG tablet Commonly known as: LOZOL Take 1 tablet (1.25 mg total) by mouth daily.   insulin aspart 100 UNIT/ML injection Commonly known as: NovoLOG Inject 2-15 Units into the skin 4 (four) times daily -  before meals and at bedtime. SSI:  121-150 = 2 units; 151-200 = 3 units, 201-250 = 5 units, 251-300 = 8 units, 301-350 = 11 units, 351-300 = 15 units, greater than 400 = 15 units and call MD  insulin detemir 100 UNIT/ML injection Commonly known as: LEVEMIR Inject 0.16 mLs (16 Units total) into the skin daily. If CBG >125 (per daughter)   lamoTRIgine 25 MG tablet Commonly known as: LAMICTAL Take 5 tablets (125 mg total) by mouth 2 (two) times daily. Five tablets   lisinopril 20 MG tablet Commonly known as: ZESTRIL Take 1 tablet (20 mg total) by mouth 2 (two) times daily.   liver oil-zinc oxide 40 % ointment Commonly known as: DESITIN Apply topically 2 (two) times daily as needed for irritation (for protection).   midodrine 5 MG tablet Commonly known as: PROAMATINE Take 1 tablet (5 mg total) by mouth in the morning, at noon, and at bedtime. FOR ORTHOSTATIC HYPOTENSION   multivitamin with minerals tablet Take 1 tablet by mouth daily.   NON FORMULARY DIET :Mechanical soft NAS CCD with Nectar Thickened liquid   pantoprazole 40 MG tablet Commonly known as: PROTONIX Take 1 tablet (40 mg total) by mouth daily.   tamsulosin 0.4 MG Caps capsule Commonly known as: FLOMAX Take 1 capsule (0.4 mg total) by mouth daily after supper.   Vitamin D3 25 MCG tablet Commonly known as: Vitamin D Take 0.5 tablets (500 Units total) by mouth daily.        Vitals:   08/30/19 1238  BP: 136/82  Pulse: 85  Resp: 18  Temp: 98 F (36.7 C)  TempSrc: Oral  Weight: 172 lb (78 kg)  Height: 6' (1.829 m)   Body mass index is 23.33  kg/m.  Physical Exam  GENERAL APPEARANCE: Alert, conversant. No acute distress.  HEENT: Unremarkable. RESPIRATORY: Breathing is even, unlabored. Lung sounds are clear   CARDIOVASCULAR: Heart RRR no murmurs, rubs or gallops. No peripheral edema.  GASTROINTESTINAL: Abdomen is soft, non-tender, not distended w/ normal bowel sounds.  NEUROLOGIC: Cranial nerves 2-12 grossly intact. Moves all extremities with right-sided weakness   Labs reviewed: Basic Metabolic Panel: Recent Labs    06/05/19 1822 06/06/19 0441 07/15/19 1437 07/15/19 1437 07/16/19 0223 07/17/19 0246 07/22/19 0000  NA  --    < > 140   < > 142 142 144  K  --    < > 3.8   < > 4.0 3.8 3.8  CL  --    < > 101   < > 102 105 104  CO2  --    < > 26   < > 27 26 26*  GLUCOSE  --    < > 362*  --  340* 288*  --   BUN  --    < > 33*   < > 24* 20 20  CREATININE 0.90   < > 1.25*   < > 1.12 1.24 1.2  CALCIUM 9.3   < > 9.2   < > 9.5 9.5 9.4  MG 2.1  --   --   --   --   --   --   PHOS 2.9  --   --   --   --   --   --    < > = values in this interval not displayed.   No results found for: [facility] Liver Function Tests: Recent Labs    06/06/19 0441 06/15/19 1945 07/15/19 1437  AST 24 24 13*  ALT 21 23 22   ALKPHOS 97 77 72  BILITOT 1.3* 0.7 1.1  PROT 6.7 6.6 6.6  ALBUMIN 4.0 3.6 3.2*   Recent Labs    06/05/19 1822  LIPASE 20   Recent Labs  06/16/19 0916  AMMONIA 21   CBC: Recent Labs    06/05/19 1352 06/05/19 1509 06/17/19 1321 06/18/19 0223 07/15/19 1437 07/15/19 1437 07/16/19 0223 07/17/19 0246 07/22/19 0000  WBC 5.1   < > 5.7   < > 7.9   < > 6.3 5.6 5.9  NEUTROABS 3.5  --  3.6  --   --   --   --   --  4  HGB 14.3   < > 13.9   < > 12.6*   < > 12.8* 12.2* 11.8*  HCT 44.4   < > 42.8   < > 40.0   < > 40.1 37.1* 35*  MCV 93.3   < > 91.8   < > 95.5  --  92.2 91.8  --   PLT 195   < > 214   < > 207  --  211 214  --    < > = values in this interval not displayed.   Lipid Recent Labs     03/06/19 0741  CHOL 119  HDL 36*  LDLCALC 68  TRIG 76   Cardiac Enzymes: Recent Labs    01/20/19 2138  CKTOTAL 73   BNP: No results for input(s): BNP in the last 8760 hours. CBG: Recent Labs    07/17/19 2029 07/18/19 0829 07/18/19 1216  GLUCAP 285* 297* 351*    Procedures and Imaging Studies During Stay: No results found.  Assessment/Plan:    Seizure/status post CVA with right hemiparesis  UTI/BPH  Diabetes mellitus type 2  hypertension  Hyperlipidemia   Patient is being discharged with the following home health services: OT/PT/ST/nursing  Patient is being discharged with the following durable medical equipment: None  Patient has been advised to f/u with their PCP in 1-2 weeks to bring them up to date on their rehab stay.  Social services at facility was responsible for arranging this appointment.  Pt was provided with a 30 day supply of prescriptions for medications and refills must be obtained from their PCP.  For controlled substances, a more limited supply may be provided adequate until PCP appointment only.     I did a time spent greater than 35 minutes daily.  Any debris;> 50% of time with patient was spent reviewing records, labs, tests and studies, counseling and developing plan of care  Margit Hanks MD

## 2019-08-31 ENCOUNTER — Encounter: Payer: Self-pay | Admitting: Internal Medicine

## 2019-09-19 ENCOUNTER — Ambulatory Visit (INDEPENDENT_AMBULATORY_CARE_PROVIDER_SITE_OTHER): Payer: Medicare Other | Admitting: *Deleted

## 2019-09-19 DIAGNOSIS — I639 Cerebral infarction, unspecified: Secondary | ICD-10-CM

## 2019-09-19 LAB — CUP PACEART REMOTE DEVICE CHECK
Date Time Interrogation Session: 20210519180310
Implantable Pulse Generator Implant Date: 20201106

## 2019-09-23 NOTE — Progress Notes (Signed)
Carelink Summary Report / Loop Recorder 

## 2019-10-21 ENCOUNTER — Ambulatory Visit (INDEPENDENT_AMBULATORY_CARE_PROVIDER_SITE_OTHER): Payer: Medicare Other | Admitting: *Deleted

## 2019-10-21 DIAGNOSIS — I639 Cerebral infarction, unspecified: Secondary | ICD-10-CM | POA: Diagnosis not present

## 2019-10-22 LAB — CUP PACEART REMOTE DEVICE CHECK
Date Time Interrogation Session: 20210621180205
Implantable Pulse Generator Implant Date: 20201106

## 2019-10-23 NOTE — Progress Notes (Signed)
Carelink Summary Report / Loop Recorder 

## 2019-11-02 ENCOUNTER — Other Ambulatory Visit: Payer: Self-pay | Admitting: Family Medicine

## 2019-11-18 ENCOUNTER — Emergency Department (HOSPITAL_COMMUNITY): Payer: Medicare Other

## 2019-11-18 ENCOUNTER — Emergency Department (EMERGENCY_DEPARTMENT_HOSPITAL)
Admission: EM | Admit: 2019-11-18 | Discharge: 2019-11-19 | Disposition: A | Discharge status: Home / Self Care | Payer: Medicare Other | Source: Home / Self Care | Attending: Emergency Medicine | Admitting: Emergency Medicine

## 2019-11-18 DIAGNOSIS — K5900 Constipation, unspecified: Secondary | ICD-10-CM

## 2019-11-18 DIAGNOSIS — R531 Weakness: Secondary | ICD-10-CM | POA: Insufficient documentation

## 2019-11-18 DIAGNOSIS — I10 Essential (primary) hypertension: Secondary | ICD-10-CM | POA: Insufficient documentation

## 2019-11-18 DIAGNOSIS — G9340 Encephalopathy, unspecified: Secondary | ICD-10-CM

## 2019-11-18 DIAGNOSIS — E1140 Type 2 diabetes mellitus with diabetic neuropathy, unspecified: Secondary | ICD-10-CM | POA: Insufficient documentation

## 2019-11-18 DIAGNOSIS — R451 Restlessness and agitation: Secondary | ICD-10-CM | POA: Insufficient documentation

## 2019-11-18 DIAGNOSIS — Z794 Long term (current) use of insulin: Secondary | ICD-10-CM | POA: Insufficient documentation

## 2019-11-18 DIAGNOSIS — Z79899 Other long term (current) drug therapy: Secondary | ICD-10-CM | POA: Insufficient documentation

## 2019-11-18 LAB — CBC WITH DIFFERENTIAL/PLATELET
Abs Immature Granulocytes: 0.02 10*3/uL (ref 0.00–0.07)
Basophils Absolute: 0 10*3/uL (ref 0.0–0.1)
Basophils Relative: 1 %
Eosinophils Absolute: 0.3 10*3/uL (ref 0.0–0.5)
Eosinophils Relative: 5 %
HCT: 42.4 % (ref 39.0–52.0)
Hemoglobin: 13.8 g/dL (ref 13.0–17.0)
Immature Granulocytes: 0 %
Lymphocytes Relative: 24 %
Lymphs Abs: 1.3 10*3/uL (ref 0.7–4.0)
MCH: 30.3 pg (ref 26.0–34.0)
MCHC: 32.5 g/dL (ref 30.0–36.0)
MCV: 93.2 fL (ref 80.0–100.0)
Monocytes Absolute: 0.5 10*3/uL (ref 0.1–1.0)
Monocytes Relative: 8 %
Neutro Abs: 3.4 10*3/uL (ref 1.7–7.7)
Neutrophils Relative %: 62 %
Platelets: 232 10*3/uL (ref 150–400)
RBC: 4.55 MIL/uL (ref 4.22–5.81)
RDW: 13 % (ref 11.5–15.5)
WBC: 5.5 10*3/uL (ref 4.0–10.5)
nRBC: 0 % (ref 0.0–0.2)

## 2019-11-18 LAB — COMPREHENSIVE METABOLIC PANEL
ALT: 28 U/L (ref 0–44)
AST: 20 U/L (ref 15–41)
Albumin: 4 g/dL (ref 3.5–5.0)
Alkaline Phosphatase: 95 U/L (ref 38–126)
Anion gap: 9 (ref 5–15)
BUN: 16 mg/dL (ref 8–23)
CO2: 27 mmol/L (ref 22–32)
Calcium: 9.6 mg/dL (ref 8.9–10.3)
Chloride: 106 mmol/L (ref 98–111)
Creatinine, Ser: 1.02 mg/dL (ref 0.61–1.24)
GFR calc Af Amer: >60 mL/min (ref >60)
GFR calc non Af Amer: >60 mL/min (ref >60)
Glucose, Bld: 89 mg/dL (ref 70–99)
Potassium: 3.7 mmol/L (ref 3.5–5.1)
Sodium: 142 mmol/L (ref 135–145)
Total Bilirubin: 0.7 mg/dL (ref 0.3–1.2)
Total Protein: 7.2 g/dL (ref 6.5–8.1)

## 2019-11-18 LAB — TROPONIN I (HIGH SENSITIVITY): Troponin I (High Sensitivity): 5 ng/L (ref <18)

## 2019-11-18 LAB — LIPASE, BLOOD: Lipase: 26 U/L (ref 11–51)

## 2019-11-18 NOTE — ED Triage Notes (Signed)
Pt presents from home with daughter where daughter reports episode of yelling out, rolling in bed, and spitting meds. Pt is at baseline R side weakness and dysphasic ("yes" or "hey") and gestures, but was not doing this at that time. H/o 2 CVAs, last in sept. In triage, pt follows commands to squeeze hand, calm. Daughter has noticed decreased output.   EMS exam: 108CBG, RR16, 98% RA, 146/80

## 2019-11-18 NOTE — ED Notes (Signed)
Phlebotomy now at bedside.

## 2019-11-18 NOTE — Discharge Instructions (Addendum)
You were seen in the emergency department for agitation and being in pain.  Your CT scan showed constipation.  You have been treated with an enema. We are discharging you to home and will be important that you follow-up with your regular doctor.  Please return to the emergency department if any worsening or concerning symptoms.

## 2019-11-18 NOTE — ED Provider Notes (Signed)
[facility] EMERGENCY DEPARTMENT Provider Note   CSN: [id number] Arrival date & time: 11/18/19  1942     History Chief Complaint  Patient presents with  . Altered Mental Status    Douglas CordiaWilliam L Weidner Jr. is a 69 y.o. male.  Level 5 caveat secondary to nonverbal.  Patient has a history of a stroke which left him with right arm right leg weakness and inability to speak other than some simple grunting.  He lives at home with family.  Daughter is here giving some history.  She said about an hour prior to arrival he seemed in pain and yelling, rolling in bed.  She tried to give him his oral medications and he spit them out.  He has since calmed.  She has not seen him act like this before.  No reported fever cough vomiting diarrhea.  Does not walk at baseline.  The history is provided by the EMS personnel and a relative.  Altered Mental Status Presenting symptoms: behavior changes   Severity:  Severe Most recent episode:  Today Episode history:  Single Duration:  1 hour Progression:  Resolved Chronicity:  New      Past Medical History:  Diagnosis Date  . Acute blood loss anemia   . Acute embolic stroke (HCC)   . Acute lower UTI   . AMS (altered mental status) 06/05/2019  . Cerebral edema (HCC) 04/04/2018  . Diabetes 1.5, managed as type 2 (HCC)   . Diabetes mellitus without complication (HCC)   . History of CVA with residual deficit   . History of Dysphagia, post-stroke   . Hypertension   . Ischemic stroke (HCC) 03/05/2019  . Labile blood glucose   . 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 Renal insufficiency 04/04/2018  . Stroke (HCC)   . UTI (urinary tract infection) 03/05/2019    Patient Active Problem List   Diagnosis Date Noted  . Delayed orthostatic hypotension 08/04/2019  . Seizure disorder (HCC) 07/21/2019  . 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 Shoulder subluxation, right, sequela 07/16/2019  . Altered mental  status 07/15/2019  . 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 Late effects of CVA (cerebrovascular accident)   . Pressure injury of skin 06/08/2019  . History of loop recorder 03/08/2019  . UTI (urinary tract infection) 03/05/2019  . 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 Global aphasia   . Type 2 diabetes mellitus with peripheral neuropathy (HCC)   . Hypertension associated with diabetes (HCC)   . Hyperlipidemia associated with type 2 diabetes mellitus (HCC)   . History of Dysphagia, post-stroke     Past Surgical History:  Procedure Laterality Date  . IR PATIENT EVAL TECH 0-60 MINS  03/30/2018  . LOOP RECORDER INSERTION N/A 03/08/2019   Procedure: LOOP RECORDER INSERTION;  Surgeon: Regan Lemmingamnitz, Will Martin, [name];  Location: [facility] INVASIVE CV LAB;  Service: Cardiovascular;  Laterality: N/A;  . TEE WITHOUT CARDIOVERSION N/A 04/02/2018   Procedure: TRANSESOPHAGEAL ECHOCARDIOGRAM (TEE);  Surgeon: Chilton Siandolph, Tiffany, [name];  [facility];  Service: Cardiovascular;  Laterality: N/A;       Family History  Problem Relation Age of Onset  . Hypertension Mother   . Hypertension Father     Social History   Tobacco Use  . Smoking status: Never Smoker  . Smokeless tobacco: Never Used  Vaping Use  . Vaping Use: Never used  Substance Use Topics  .  Alcohol use: Not Currently  . Drug use: Never    Home Medications Prior to Admission medications   Medication Sig Start Date End Date Taking? Authorizing Provider  Amino Acids-Protein Hydrolys (FEEDING SUPPLEMENT, PRO-STAT SUGAR FREE 64,) LIQD Take 30 mLs by mouth in the morning and at bedtime. 08/30/19   Margit Hanks, [name]  amLODipine (NORVASC) 10 MG tablet Take 1 tablet (10 mg total) by mouth daily. 08/30/19   Margit Hanks, [name]  ascorbic acid (VITAMIN C) 500 MG tablet Take 1 tablet (500 mg total) by mouth daily as needed (if cold-like symptoms are present). 08/30/19    Margit Hanks, [name]  atorvastatin (LIPITOR) 40 MG tablet Take 1 tablet (40 mg total) by mouth daily at 6 PM. 06/21/19   Autry-Lott, Randa Evens, DO  baclofen (LIORESAL) 10 MG tablet Take 2 tablets (20 mg total) by mouth 2 (two) times daily. 08/30/19   Margit Hanks, [name]  calcium citrate (CALCITRATE - DOSED IN MG ELEMENTAL CALCIUM) 950 (200 Ca) MG tablet Take 0.5 tablets (475 mg total) by mouth daily. 08/30/19   Margit Hanks, [name]  carvedilol (COREG) 12.5 MG tablet Take 1 tablet (12.5 mg total) by mouth 2 (two) times daily with a meal. 08/30/19   Margit Hanks, [name]  clopidogrel (PLAVIX) 75 MG tablet Take 1 tablet (75 mg total) by mouth daily. 08/30/19   Margit Hanks, [name]  indapamide (LOZOL) 1.25 MG tablet Take 1 tablet (1.25 mg total) by mouth daily. 08/30/19   Margit Hanks, [name]  insulin aspart (NOVOLOG) 100 UNIT/ML injection Inject 2-15 Units into the skin 4 (four) times daily -  before meals and at bedtime. SSI:  121-150 = 2 units; 151-200 = 3 units, 201-250 = 5 units, 251-300 = 8 units, 301-350 = 11 units, 351-300 = 15 units, greater than 400 = 15 units and call [name] 08/30/19   Margit Hanks, [name]  insulin detemir (LEVEMIR) 100 UNIT/ML injection Inject 0.16 mLs (16 Units total) into the skin daily. If CBG >125 (per daughter) 08/30/19   Margit Hanks, [name]  lamoTRIgine (LAMICTAL) 25 MG tablet Take 5 tablets (125 mg total) by mouth 2 (two) times daily. Five tablets 08/30/19   Margit Hanks, [name]  lisinopril (ZESTRIL) 20 MG tablet Take 1 tablet (20 mg total) by mouth 2 (two) times daily. 08/30/19   Margit Hanks, [name]  liver oil-zinc oxide (DESITIN) 40 % ointment Apply topically 2 (two) times daily as needed for irritation (for protection). 08/30/19   Margit Hanks, [name]  midodrine (PROAMATINE) 5 MG tablet Take 1 tablet (5 mg total) by mouth in the morning, at noon, and at bedtime. FOR ORTHOSTATIC HYPOTENSION 08/30/19   Margit Hanks, [name]  Multiple Vitamins-Minerals (MULTIVITAMIN WITH  MINERALS) tablet Take 1 tablet by mouth daily.    Provider, Historical, [name]  NON FORMULARY DIET :Mechanical soft NAS CCD with Nectar Thickened liquid    Provider, Historical, [name]  pantoprazole (PROTONIX) 40 MG tablet Take 1 tablet (40 mg total) by mouth daily. 08/30/19   Margit Hanks, [name]  tamsulosin (FLOMAX) 0.4 MG CAPS capsule Take 1 capsule (0.4 mg total) by mouth daily after supper. 08/30/19   Margit Hanks, [name]  Vitamin D3 (VITAMIN D) 25 MCG tablet Take 0.5 tablets (500 Units total) by mouth daily. 08/30/19   Margit Hanks, [name]    Allergies    Ciprofloxacin, Keppra [levetiracetam], Metformin and related, and Other  Review of Systems  Review of Systems  Unable to perform ROS: Patient nonverbal    Physical Exam Updated Vital Signs BP (!) 145/81 (BP Location: Left Arm)   Pulse 78   Temp 98.3 F (36.8 C) (Oral)   Resp 20   SpO2 98%   Physical Exam Vitals and nursing note reviewed.  Constitutional:      Appearance: Normal appearance. He is well-developed.  HENT:     Head: Normocephalic and atraumatic.  Eyes:     Conjunctiva/sclera: Conjunctivae normal.  Cardiovascular:     Rate and Rhythm: Normal rate and regular rhythm.     Heart sounds: No murmur heard.   Pulmonary:     Effort: Pulmonary effort is normal. No respiratory distress.     Breath sounds: Normal breath sounds.  Abdominal:     Palpations: Abdomen is soft.     Tenderness: There is no abdominal tenderness. There is no guarding or rebound.  Musculoskeletal:        General: No deformity.     Cervical back: Neck supple.     Right lower leg: No edema.     Left lower leg: No edema.  Skin:    General: Skin is warm and dry.     Capillary Refill: Capillary refill takes less than 2 seconds.  Neurological:     Mental Status: He is alert.     Motor: Weakness present.     Comments: He is minimally verbal and interactive.  Not following commands with right arm or right leg.  Will follow commands with left arm  and left leg.  Appears to have intact strength on the left side.     ED Results / Procedures / Treatments   Labs (all labs ordered are listed, but only abnormal results are displayed) Labs Reviewed  URINALYSIS, ROUTINE W REFLEX MICROSCOPIC - Abnormal; Notable for the following components:      Result Value   Hgb urine dipstick SMALL (*)    Leukocytes,Ua TRACE (*)    All other components within normal limits  SARS CORONAVIRUS 2 BY RT PCR (HOSPITAL ORDER, PERFORMED IN [facility])  COMPREHENSIVE METABOLIC PANEL  LIPASE, BLOOD  CBC WITH DIFFERENTIAL/PLATELET  TROPONIN I (HIGH SENSITIVITY)  TROPONIN I (HIGH SENSITIVITY)    EKG EKG Interpretation  Date/Time:  Monday November 18 2019 20:25:26 EDT Ventricular Rate:  77 PR Interval:    QRS Duration: 93 QT Interval:  361 QTC Calculation: 409 R Axis:   67 Text Interpretation: Sinus rhythm Artifact in lead(s) I II III aVR aVL aVF V1 No significant change since prior 3/21 Confirmed by Douglas [name]) on 11/18/2019 8:33:34 PM Also confirmed by Douglas [name]), editor Douglas Rasher ([id number])  on 11/19/2019 7:09:14 AM   Radiology CT Head Wo Contrast  Result Date: 11/19/2019 CLINICAL DATA:  Headache, altered mental status, combativeness, prior infarct EXAM: CT HEAD WITHOUT CONTRAST TECHNIQUE: Contiguous axial images were obtained from the base of the skull through the vertex without intravenous contrast. COMPARISON:  CT 07/15/2019, MRI 06/17/2019 FINDINGS: Brain: Large left remote MCA and PCA territory infarcts are again identified. Moderate parenchymal volume loss. Extensive periventricular white matter changes are present likely reflecting the sequela of small vessel ischemia. Mild ex vacuo dilation of the left lateral ventricle. Ventricular size is normal. No evidence of acute intracranial hemorrhage or infarct. No abnormal mass effect or midline shift. No abnormal intra or extra-axial mass lesion or fluid collection.  Vascular: Extensive vascular calcifications are seen within the intracranial vertebral arteries  and carotid siphons. Hyperdensity of the left MCA at the skull base is similar to prior examination and likely related to intracranial atherosclerotic disease. Skull: The calvarial vertex is not included on this examination. The viscera is calvarium is intact. Sinuses/Orbits: The orbits are unremarkable. The paranasal sinuses are clear. Other: Mastoid air cells and middle ear cavities are clear. IMPRESSION: No evidence of acute intracranial hemorrhage or infarct. Large, remote left MCA and PCA territory infarct again noted. Extensive intracranial atherosclerotic disease. Electronically Signed   By: Douglas [name]   On: 11/19/2019 00:07   DG Chest Port 1 View  Result Date: 11/18/2019 CLINICAL DATA:  Altered EXAM: PORTABLE CHEST 1 VIEW COMPARISON:  07/15/2019 FINDINGS: Left-sided electronic recording device. No focal opacity or pleural effusion. Stable cardiomediastinal silhouette. No pneumothorax. IMPRESSION: No active disease. Electronically Signed   By: Douglas Pang M.D.   On: 11/18/2019 20:39   CT Renal Stone Study  Result Date: 11/19/2019 CLINICAL DATA:  Decreased urine output. EXAM: CT ABDOMEN AND PELVIS WITHOUT CONTRAST TECHNIQUE: Multidetector CT imaging of the abdomen and pelvis was performed following the standard protocol without IV contrast. COMPARISON:  None. FINDINGS: Lower chest: No acute abnormality. Hepatobiliary: No focal liver abnormality is seen. A subcentimeter gallstone is seen within the gallbladder lumen without evidence of gallbladder wall thickening or biliary dilatation. Pancreas: Unremarkable. No pancreatic ductal dilatation or surrounding inflammatory changes. Spleen: Normal in size without focal abnormality. Adrenals/Urinary Tract: Adrenal glands are unremarkable. Kidneys are normal in size without focal lesions. Multiple 2 mm nonobstructing renal stones are seen within the left  kidney. A cluster of 3 mm and 4 mm nonobstructing renal stones are also seen within the lower pole of the left kidney. Bladder is unremarkable. Stomach/Bowel: Stomach is within normal limits. Appendix appears normal. A very large amount of stool is seen within the distal sigmoid colon and rectum. No evidence of bowel wall thickening, distention, or inflammatory changes. Vascular/Lymphatic: There is mild calcification of the abdominal aorta. No enlarged abdominal or pelvic lymph nodes. Reproductive: Prostate is unremarkable. Other: No abdominal wall hernia or abnormality. No abdominopelvic ascites. Musculoskeletal: Marked severity degenerative changes seen at the level of L4-L5. IMPRESSION: 1. Cholelithiasis without evidence of cholecystitis. 2. Multiple nonobstructing renal stones within the left kidney. 3. Large amount of stool within the distal sigmoid colon and rectum. 4. Marked severity degenerative changes at the level of L4-L5. 5. Aortic atherosclerosis. Aortic Atherosclerosis (ICD10-I70.0). Electronically Signed   By: Douglas Candela M.D.   On: 11/19/2019 01:54    Procedures Procedures (including critical care time)  Medications Ordered in ED Medications - No data to display  ED Course  I have reviewed the triage vital signs and the nursing notes.  Pertinent labs & imaging results that were available during my care of the patient were reviewed by me and considered in my medical decision making (see chart for details).  Clinical Course as of Nov 19 1111  Mon Nov 18, 2019  2337 Reviewed lab work with the patient's daughter.  She asked if we could check a CAT scan of his head as that is where his main problems have originated.  Will check CT.  Likely discharge if no acute findings and remains asymptomatic.   [MB]    Clinical Course User Index [MB] Douglas Files, [name]   MDM Rules/Calculators/A&P                         This patient complains  of agitation possibly pain; this involves an  extensive number of treatment Options and is a complaint that carries with it a high risk of complications and Morbidity. The differential includes ACS, pneumonia, metabolic derangement, PE, stroke, bleed, infection  I ordered, reviewed and interpreted labs, which included CBC with normal white count normal hemoglobin, chemistries and LFTs normal, troponin low and unchanged, urinalysis without signs of infection I ordered medication none I ordered imaging studies which included chest x-ray and head CT and I independently    visualized and interpreted imaging which showed no acute findings Additional history obtained from patient's daughter Previous records obtained and reviewed in epic, no recent admissions  After the interventions stated above, I reevaluated the patient and found him to remain calm and in no distress.  He is pending results of his head CT.  I reviewed with his daughter his unremarkable work-up so far.  Patient is signed out to Dr. Daun Peacock with plan to follow-up on head CT and reassess for likely discharge to home unless condition changes.   Final Clinical Impression(s) / ED Diagnoses Final diagnoses:  Agitation  Constipation, unspecified constipation type    Rx / DC Orders ED Discharge Orders    None       Douglas Files, [name] 11/19/19 [phone]

## 2019-11-18 NOTE — ED Notes (Signed)
Unable to obtain blood for ordered lab work, phlebotomy consulted

## 2019-11-19 ENCOUNTER — Encounter (HOSPITAL_COMMUNITY): Payer: Self-pay | Admitting: Emergency Medicine

## 2019-11-19 ENCOUNTER — Emergency Department (HOSPITAL_COMMUNITY): Payer: Medicare Other

## 2019-11-19 DIAGNOSIS — G9340 Encephalopathy, unspecified: Secondary | ICD-10-CM

## 2019-11-19 DIAGNOSIS — K5900 Constipation, unspecified: Secondary | ICD-10-CM | POA: Diagnosis not present

## 2019-11-19 LAB — URINALYSIS, ROUTINE W REFLEX MICROSCOPIC
Bacteria, UA: NONE SEEN
Bilirubin Urine: NEGATIVE
Glucose, UA: NEGATIVE mg/dL
Ketones, ur: NEGATIVE mg/dL
Nitrite: NEGATIVE
Protein, ur: NEGATIVE mg/dL
Specific Gravity, Urine: 1.016 (ref 1.005–1.030)
pH: 5 (ref 5.0–8.0)

## 2019-11-19 LAB — TROPONIN I (HIGH SENSITIVITY): Troponin I (High Sensitivity): 5 ng/L (ref <18)

## 2019-11-19 MED ORDER — MILK AND MOLASSES ENEMA
1.0000 | Freq: Once | RECTAL | Status: DC
  Filled 2019-11-19: qty 240

## 2019-11-19 MED ORDER — BISACODYL 10 MG RE SUPP: 10.0000 mg | Freq: Two times a day (BID) | RECTAL | 0 refills | Status: DC

## 2019-11-19 MED ORDER — SENNOSIDES-DOCUSATE SODIUM 8.6-50 MG PO TABS
1.0000 | ORAL_TABLET | Freq: Every day | ORAL | 0 refills | Status: DC
Start: 2019-11-19 — End: 2019-12-25

## 2019-11-19 MED ORDER — LORAZEPAM 2 MG/ML IJ SOLN: 1.0000 mg | Freq: Once | INTRAMUSCULAR | Status: DC

## 2019-11-19 MED ORDER — POLYETHYLENE GLYCOL 3350 17 G PO PACK: 17.0000 g | PACK | Freq: Two times a day (BID) | ORAL | 0 refills | Status: DC

## 2019-11-19 NOTE — ED Notes (Signed)
Zuhair Lariccia daughter [phone] would like an update

## 2019-11-19 NOTE — Consult Note (Signed)
Patient Demographics  Douglas [name], is a 69 y.o. male   MRN: [id number]   DOB - 05-03-1950  Admit Date - 11/18/2019    Outpatient Primary MD for the patient is Ralene Ok, MD  Consult requested in the Hospital by No att. providers found, On 11/19/2019    Reason for consult : evaluate for admission   With History of -  Past Medical History:  Diagnosis Date  . Acute blood loss anemia   . Acute embolic stroke (HCC)   . Acute lower UTI   . AMS (altered mental status) 06/05/2019  . Cerebral edema (HCC) 04/04/2018  . Diabetes 1.5, managed as type 2 (HCC)   . Diabetes mellitus without complication (HCC)   . History of CVA with residual deficit   . History of Dysphagia, post-stroke   . Hypertension   . Ischemic stroke (HCC) 03/05/2019  . Labile blood glucose   . 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 Renal insufficiency 04/04/2018  . Stroke (HCC)   . UTI (urinary tract infection) 03/05/2019      Past Surgical History:  Procedure Laterality Date  . IR PATIENT EVAL TECH 0-60 MINS  03/30/2018  . LOOP RECORDER INSERTION N/A 03/08/2019   Procedure: LOOP RECORDER INSERTION;  Surgeon: Regan Lemming, MD;  Location: [facility] INVASIVE CV LAB;  Service: Cardiovascular;  Laterality: N/A;  . TEE WITHOUT CARDIOVERSION N/A 04/02/2018   Procedure: TRANSESOPHAGEAL ECHOCARDIOGRAM (TEE);  Surgeon: [name], MD;  Location: [facility] ENDOSCOPY;  Service: Cardiovascular;  Laterality: N/A;    in for   Chief Complaint  Patient presents with  . Altered Mental Status     HPI  Douglas [name]  is a 69 y.o. 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 - in ED CT head with no acute findings, UA with no evidence of infection, and had 2 - troponins, no significant labs abnormalities were only significant for large amount of stool with him the distal sigmoid colon and rectum, patient was noted to have some left hand repetitive movement, sed rate hospitalist were consulted to evaluate for need of admission.    Review of Systems    Unable to obtain obtain an reliable review of system from the patient who is nonverbal, bedbound, noncommunicative   Social History Social History   Tobacco Use  . Smoking status: Never Smoker  . Smokeless tobacco: Never Used  Substance Use  Topics  . Alcohol use: Not Currently    Family History Family History  Problem Relation Age of Onset  . Hypertension Mother   . Hypertension Father      Prior to Admission medications   Medication Sig Start Date End Date Taking? Authorizing Provider  amLODipine (NORVASC) 10 MG tablet Take 1 tablet (10 mg total) by mouth daily. Patient taking differently: Take 10 mg by mouth every morning.  08/30/19  Yes Margit Hanks, MD  ascorbic acid (VITAMIN C) 500 MG tablet Take 1 tablet (500 mg total) by mouth daily as needed (if cold-like symptoms are present). Patient taking differently: Take 500 mg by mouth every morning.  08/30/19  Yes Margit Hanks, MD  aspirin EC 325 MG tablet Take 325 mg by mouth every morning.   Yes [provider]  atorvastatin (LIPITOR) 40 MG tablet Take 1 tablet (40 mg total) by mouth daily at 6 PM. Patient taking differently: Take 40 mg by mouth at bedtime.  06/21/19   Yes Autry-Lott, Randa Evens, DO  baclofen (LIORESAL) 10 MG tablet Take 2 tablets (20 mg total) by mouth 2 (two) times daily. Patient taking differently: Take 20 mg by mouth in the morning and at bedtime.  08/30/19  Yes Margit Hanks, MD  carvedilol (COREG) 12.5 MG tablet Take 1 tablet (12.5 mg total) by mouth 2 (two) times daily with a meal. Patient taking differently: Take 12.5 mg by mouth See admin instructions. Take one tablet (12.5 mg) by mouth twice daily - 10am and 3pm 08/30/19  Yes Margit Hanks, MD  Cholecalciferol (VITAMIN D3 PO) Take 1 tablet by mouth every morning.   Yes [provider]  clopidogrel (PLAVIX) 75 MG tablet Take 1 tablet (75 mg total) by mouth daily. Patient taking differently: Take 75 mg by mouth every morning.  08/30/19  Yes Margit Hanks, MD  glipiZIDE (GLUCOTROL) 10 MG tablet Take 10 mg by mouth 2 (two) times daily with a meal. 08/27/19  Yes [provider]  indapamide (LOZOL) 1.25 MG tablet Take 1 tablet (1.25 mg total) by mouth daily. Patient taking differently: Take 1.25 mg by mouth every morning.  08/30/19  Yes Margit Hanks, MD  insulin aspart (NOVOLOG) 100 UNIT/ML injection Inject 2-15 Units into the skin 4 (four) times daily -  before meals and at bedtime. SSI:  121-150 = 2 units; 151-200 = 3 units, 201-250 = 5 units, 251-300 = 8 units, 301-350 = 11 units, 351-300 = 15 units, greater than 400 = 15 units and call MD 08/30/19  Yes Margit Hanks, MD  insulin detemir (LEVEMIR) 100 UNIT/ML injection Inject 0.16 mLs (16 Units total) into the skin daily. If CBG >125 (per daughter) Patient taking differently: Inject 16 Units into the skin daily as needed (CBG >125).  08/30/19  Yes Margit Hanks, MD  lamoTRIgine (LAMICTAL) 25 MG tablet Take 5 tablets (125 mg total) by mouth 2 (two) times daily. Five tablets Patient taking differently: Take 100 mg by mouth in the morning and at bedtime.  08/30/19  Yes Margit Hanks, MD  lisinopril (ZESTRIL)  20 MG tablet Take 1 tablet (20 mg total) by mouth 2 (two) times daily. Patient taking differently: Take 20 mg by mouth See admin instructions. Take one tablet (20 mg) by mouth twice daily - 10am and 3pm 08/30/19  Yes Margit Hanks, MD  midodrine (PROAMATINE) 5 MG tablet Take 1 tablet (5 mg total) by mouth in the morning, at noon,  and at bedtime. FOR ORTHOSTATIC HYPOTENSION 08/30/19  Yes Margit HanksAlexander, Anne D, MD  Nystatin (GERHARDT'S BUTT CREAM) CREA Apply 1 application topically daily.   Yes [provider]  bisacodyl (DULCOLAX) 10 MG suppository Place 1 suppository (10 mg total) rectally 2 (two) times daily. 11/19/19   Srihith Aquilino, Leana Roeawood S, MD  liver oil-zinc oxide (DESITIN) 40 % ointment Apply topically 2 (two) times daily as needed for irritation (for protection). Patient not taking: Reported on 11/18/2019 08/30/19   Margit HanksAlexander, Anne D, MD  pantoprazole (PROTONIX) 40 MG tablet Take 1 tablet (40 mg total) by mouth daily. Patient not taking: Reported on 11/18/2019 08/30/19   Margit HanksAlexander, Anne D, MD  polyethylene glycol (MIRALAX) 17 g packet Take 17 g by mouth 2 (two) times daily for 5 days. 11/19/19 11/24/19  Sevanna Ballengee, Leana Roeawood S, MD  senna-docusate (SENOKOT-S) 8.6-50 MG tablet Take 1 tablet by mouth daily. 11/19/19   Regene Mccarthy, Leana Roeawood S, MD  tamsulosin (FLOMAX) 0.4 MG CAPS capsule Take 1 capsule (0.4 mg total) by mouth daily after supper. Patient not taking: Reported on 11/18/2019 08/30/19   Margit HanksAlexander, Anne D, MD  Vitamin D3 (VITAMIN D) 25 MCG tablet Take 0.5 tablets (500 Units total) by mouth daily. Patient not taking: Reported on 11/18/2019 08/30/19   Margit HanksAlexander, Anne D, MD    Anti-infectives (From admission, onward)   None      Scheduled Meds: . milk and molasses  1 enema Rectal Once   Continuous Infusions: PRN Meds:.  Allergies  Allergen Reactions  . Ciprofloxacin Anaphylaxis  . Keppra [Levetiracetam] Other (See Comments)    Makes the patient very agitated and "not like himself"  .  Avocado Nausea And Vomiting  . Metformin And Related Other (See Comments)    Renal issue    Physical Exam  Vitals  Blood pressure (!) 162/83, pulse 88, temperature 98.3 F (36.8 C), temperature source Oral, resp. rate 16, SpO2 98 %.   1. General frail elderly male, in bed, no apparent distress  2.  Patient is bedbound at baseline, noncommunicative, nonverbal, only saying yes repetitively at baseline .  3.  She is flaccid and contracted in the right side, upper extremity with appropriate motor strength.  4. Ears and Eyes appear Normal, Conjunctivae clear, PERRLA. Moist Oral Mucosa.  5. Supple Neck, No JVD, No cervical lymphadenopathy appriciated, No Carotid Bruits.  6. Symmetrical Chest wall movement, Good air movement bilaterally, CTAB.  7. RRR, No Gallops, Rubs or Murmurs, No Parasternal Heave.  8. Positive Bowel Sounds, Abdomen Soft, No tenderness, No organomegaly appriciated,No rebound -guarding or rigidity.  9.  No Cyanosis, Normal Skin Turgor, No Skin Rash or Bruise.     Data Review  CBC Recent Labs  Lab 11/18/19 2153  WBC 5.5  HGB 13.8  HCT 42.4  PLT 232  MCV 93.2  MCH 30.3  MCHC 32.5  RDW 13.0  LYMPHSABS 1.3  MONOABS 0.5  EOSABS 0.3  BASOSABS 0.0   ------------------------------------------------------------------------------------------------------------------  Chemistries  Recent Labs  Lab 11/18/19 2153  NA 142  K 3.7  CL 106  CO2 27  GLUCOSE 89  BUN 16  CREATININE 1.02  CALCIUM 9.6  AST 20  ALT 28  ALKPHOS 95  BILITOT 0.7   ------------------------------------------------------------------------------------------------------------------ CrCl cannot be calculated (Unknown ideal weight.). ------------------------------------------------------------------------------------------------------------------ No results for input(s): TSH, T4TOTAL, T3FREE, THYROIDAB in the last 72 hours.  Invalid input(s): FREET3   Coagulation profile No  results for input(s): INR, PROTIME in the last 168 hours. ------------------------------------------------------------------------------------------------------------------- No results for input(s):  DDIMER in the last 72 hours. -------------------------------------------------------------------------------------------------------------------  Cardiac Enzymes No results for input(s): CKMB, TROPONINI, MYOGLOBIN in the last 168 hours.  Invalid input(s): CK ------------------------------------------------------------------------------------------------------------------ Invalid input(s): POCBNP   ---------------------------------------------------------------------------------------------------------------  Urinalysis    Component Value Date/Time   COLORURINE YELLOW 11/19/2019 0026   APPEARANCEUR CLEAR 11/19/2019 0026   LABSPEC 1.016 11/19/2019 0026   PHURINE 5.0 11/19/2019 0026   GLUCOSEU NEGATIVE 11/19/2019 0026   HGBUR SMALL (A) 11/19/2019 0026   BILIRUBINUR NEGATIVE 11/19/2019 0026   KETONESUR NEGATIVE 11/19/2019 0026   PROTEINUR NEGATIVE 11/19/2019 0026   NITRITE NEGATIVE 11/19/2019 0026   LEUKOCYTESUR TRACE (A) 11/19/2019 0026     Imaging results:   CT Head Wo Contrast  Result Date: 11/19/2019 CLINICAL DATA:  Headache, altered mental status, combativeness, prior infarct EXAM: CT HEAD WITHOUT CONTRAST TECHNIQUE: Contiguous axial images were obtained from the base of the skull through the vertex without intravenous contrast. COMPARISON:  CT 07/15/2019, MRI 06/17/2019 FINDINGS: Brain: Large left remote MCA and PCA territory infarcts are again identified. Moderate parenchymal volume loss. Extensive periventricular white matter changes are present likely reflecting the sequela of small vessel ischemia. Mild ex vacuo dilation of the left lateral ventricle. Ventricular size is normal. No evidence of acute intracranial hemorrhage or infarct. No abnormal mass effect or midline shift. No  abnormal intra or extra-axial mass lesion or fluid collection. Vascular: Extensive vascular calcifications are seen within the intracranial vertebral arteries and carotid siphons. Hyperdensity of the left MCA at the skull base is similar to prior examination and likely related to intracranial atherosclerotic disease. Skull: The calvarial vertex is not included on this examination. The viscera is calvarium is intact. Sinuses/Orbits: The orbits are unremarkable. The paranasal sinuses are clear. Other: Mastoid air cells and middle ear cavities are clear. IMPRESSION: No evidence of acute intracranial hemorrhage or infarct. Large, remote left MCA and PCA territory infarct again noted. Extensive intracranial atherosclerotic disease. Electronically Signed   By: Helyn Numbers MD   On: 11/19/2019 00:07   DG Chest Port 1 View  Result Date: 11/18/2019 CLINICAL DATA:  Altered EXAM: PORTABLE CHEST 1 VIEW COMPARISON:  07/15/2019 FINDINGS: Left-sided electronic recording device. No focal opacity or pleural effusion. Stable cardiomediastinal silhouette. No pneumothorax. IMPRESSION: No active disease. Electronically Signed   By: Jasmine Pang M.D.   On: 11/18/2019 20:39   CT Renal Stone Study  Result Date: 11/19/2019 CLINICAL DATA:  Decreased urine output. EXAM: CT ABDOMEN AND PELVIS WITHOUT CONTRAST TECHNIQUE: Multidetector CT imaging of the abdomen and pelvis was performed following the standard protocol without IV contrast. COMPARISON:  None. FINDINGS: Lower chest: No acute abnormality. Hepatobiliary: No focal liver abnormality is seen. A subcentimeter gallstone is seen within the gallbladder lumen without evidence of gallbladder wall thickening or biliary dilatation. Pancreas: Unremarkable. No pancreatic ductal dilatation or surrounding inflammatory changes. Spleen: Normal in size without focal abnormality. Adrenals/Urinary Tract: Adrenal glands are unremarkable. Kidneys are normal in size without focal lesions.  Multiple 2 mm nonobstructing renal stones are seen within the left kidney. A cluster of 3 mm and 4 mm nonobstructing renal stones are also seen within the lower pole of the left kidney. Bladder is unremarkable. Stomach/Bowel: Stomach is within normal limits. Appendix appears normal. A very large amount of stool is seen within the distal sigmoid colon and rectum. No evidence of bowel wall thickening, distention, or inflammatory changes. Vascular/Lymphatic: There is mild calcification of the abdominal aorta. No enlarged abdominal or pelvic lymph nodes. Reproductive: Prostate is unremarkable. Other: No  abdominal wall hernia or abnormality. No abdominopelvic ascites. Musculoskeletal: Marked severity degenerative changes seen at the level of L4-L5. IMPRESSION: 1. Cholelithiasis without evidence of cholecystitis. 2. Multiple nonobstructing renal stones within the left kidney. 3. Large amount of stool within the distal sigmoid colon and rectum. 4. Marked severity degenerative changes at the level of L4-L5. 5. Aortic atherosclerosis. Aortic Atherosclerosis (ICD10-I70.0). Electronically Signed   By: Aram Candela M.D.   On: 11/19/2019 01:54      Assessment & Plan  Active Problems:   Encephalopathy  Encephalopathy -Upon my evaluation, patient appears to be back at baseline, he is nonverbal at baseline, but does follow some simple, when I asked him, he did squeeze my hand, I have discussed with staff, and noted this repetitive left hand movement, as well it has been seen by  neurologist on-call, does not resemble any seizures, and is just intentional hand movements, and neurologist confirms that. -Have discussed with the daughter at phone, his work-up is reassuring including CT head, labs, urine analysis, I discussed her the concern about his bad constipation, discussed with her the importance of good bowel regimen, I have informed her he will need to continue with Dulcolax suppository twice daily for 3 days,  with MiraLAX twice daily for 5 days, and then to continue with senna Colace tablet twice daily for 2 weeks, I have answered all her questions, answered all her concerns, and plan is to discharge back home from ED, and she did request [facility] for transport, and I have relayed the message to ED staff.   AM Labs Ordered, also please review Full Orders  Family Communication: Plan discussed with daughter via phone.   Thank you for the consult, we will follow the patient with you in the Hospital.   Huey Bienenstock M.D on 11/19/2019 at 4:04 AM  After 7pm go to [email or web address]    Thank you for the consult, we will follow the patient with you in the Hospital.   [facility]   Office  [phone]

## 2019-11-19 NOTE — ED Provider Notes (Deleted)
Patient is yelling hey repetitively, now he is having having repetitive motion of the left hand.     NCAT PERRL RRR CTAB NABS, soft  Involuntary movement of the left hand   I have discussed with hospitalist and have spoke to neurology who will see the patient.  Recommend ativan which has been ordered.     Jorryn Casagrande, MD 11/19/19 [phone]

## 2019-11-20 ENCOUNTER — Inpatient Hospital Stay (HOSPITAL_COMMUNITY): Payer: Medicare Other

## 2019-11-20 ENCOUNTER — Encounter (HOSPITAL_COMMUNITY): Payer: Self-pay | Admitting: Emergency Medicine

## 2019-11-20 ENCOUNTER — Emergency Department (HOSPITAL_COMMUNITY): Payer: Medicare Other

## 2019-11-20 ENCOUNTER — Inpatient Hospital Stay (HOSPITAL_COMMUNITY)
Admission: EM | Admit: 2019-11-20 | Discharge: 2019-11-27 | DRG: 101 | Disposition: A | Discharge status: Rehabilitation Facility | Payer: Medicare Other | Attending: Internal Medicine | Admitting: Internal Medicine

## 2019-11-20 ENCOUNTER — Other Ambulatory Visit: Payer: Self-pay

## 2019-11-20 DIAGNOSIS — I69320 Aphasia following cerebral infarction: Secondary | ICD-10-CM | POA: Diagnosis not present

## 2019-11-20 DIAGNOSIS — N400 Enlarged prostate without lower urinary tract symptoms: Secondary | ICD-10-CM | POA: Diagnosis present

## 2019-11-20 DIAGNOSIS — G9389 Other specified disorders of brain: Secondary | ICD-10-CM | POA: Diagnosis not present

## 2019-11-20 DIAGNOSIS — Z794 Long term (current) use of insulin: Secondary | ICD-10-CM

## 2019-11-20 DIAGNOSIS — I69392 Facial weakness following cerebral infarction: Secondary | ICD-10-CM | POA: Diagnosis not present

## 2019-11-20 DIAGNOSIS — G9340 Encephalopathy, unspecified: Secondary | ICD-10-CM | POA: Diagnosis present

## 2019-11-20 DIAGNOSIS — K5900 Constipation, unspecified: Secondary | ICD-10-CM | POA: Diagnosis present

## 2019-11-20 DIAGNOSIS — E785 Hyperlipidemia, unspecified: Secondary | ICD-10-CM | POA: Diagnosis present

## 2019-11-20 DIAGNOSIS — Z7902 Long term (current) use of antithrombotics/antiplatelets: Secondary | ICD-10-CM

## 2019-11-20 DIAGNOSIS — Z888 Allergy status to other drugs, medicaments and biological substances status: Secondary | ICD-10-CM

## 2019-11-20 DIAGNOSIS — I152 Hypertension secondary to endocrine disorders: Secondary | ICD-10-CM | POA: Diagnosis present

## 2019-11-20 DIAGNOSIS — Z7401 Bed confinement status: Secondary | ICD-10-CM

## 2019-11-20 DIAGNOSIS — N1831 Chronic kidney disease, stage 3a: Secondary | ICD-10-CM | POA: Diagnosis present

## 2019-11-20 DIAGNOSIS — Z7982 Long term (current) use of aspirin: Secondary | ICD-10-CM

## 2019-11-20 DIAGNOSIS — E11649 Type 2 diabetes mellitus with hypoglycemia without coma: Secondary | ICD-10-CM | POA: Diagnosis not present

## 2019-11-20 DIAGNOSIS — R1312 Dysphagia, oropharyngeal phase: Secondary | ICD-10-CM | POA: Diagnosis present

## 2019-11-20 DIAGNOSIS — E1169 Type 2 diabetes mellitus with other specified complication: Secondary | ICD-10-CM | POA: Diagnosis present

## 2019-11-20 DIAGNOSIS — E1159 Type 2 diabetes mellitus with other circulatory complications: Secondary | ICD-10-CM | POA: Diagnosis present

## 2019-11-20 DIAGNOSIS — R9401 Abnormal electroencephalogram [EEG]: Secondary | ICD-10-CM

## 2019-11-20 DIAGNOSIS — R54 Age-related physical debility: Secondary | ICD-10-CM | POA: Diagnosis present

## 2019-11-20 DIAGNOSIS — J449 Chronic obstructive pulmonary disease, unspecified: Secondary | ICD-10-CM | POA: Diagnosis present

## 2019-11-20 DIAGNOSIS — I639 Cerebral infarction, unspecified: Secondary | ICD-10-CM | POA: Diagnosis not present

## 2019-11-20 DIAGNOSIS — Z881 Allergy status to other antibiotic agents status: Secondary | ICD-10-CM | POA: Diagnosis not present

## 2019-11-20 DIAGNOSIS — Z79899 Other long term (current) drug therapy: Secondary | ICD-10-CM

## 2019-11-20 DIAGNOSIS — G40209 Localization-related (focal) (partial) symptomatic epilepsy and epileptic syndromes with complex partial seizures, not intractable, without status epilepticus: Principal | ICD-10-CM | POA: Diagnosis present

## 2019-11-20 DIAGNOSIS — E1142 Type 2 diabetes mellitus with diabetic polyneuropathy: Secondary | ICD-10-CM | POA: Diagnosis present

## 2019-11-20 DIAGNOSIS — I5032 Chronic diastolic (congestive) heart failure: Secondary | ICD-10-CM | POA: Diagnosis present

## 2019-11-20 DIAGNOSIS — E1165 Type 2 diabetes mellitus with hyperglycemia: Secondary | ICD-10-CM | POA: Diagnosis not present

## 2019-11-20 DIAGNOSIS — Z8249 Family history of ischemic heart disease and other diseases of the circulatory system: Secondary | ICD-10-CM

## 2019-11-20 DIAGNOSIS — I69351 Hemiplegia and hemiparesis following cerebral infarction affecting right dominant side: Secondary | ICD-10-CM | POA: Diagnosis not present

## 2019-11-20 DIAGNOSIS — Z20822 Contact with and (suspected) exposure to covid-19: Secondary | ICD-10-CM | POA: Diagnosis present

## 2019-11-20 DIAGNOSIS — M24411 Recurrent dislocation, right shoulder: Secondary | ICD-10-CM | POA: Diagnosis present

## 2019-11-20 DIAGNOSIS — R569 Unspecified convulsions: Secondary | ICD-10-CM | POA: Diagnosis present

## 2019-11-20 DIAGNOSIS — Z87440 Personal history of urinary (tract) infections: Secondary | ICD-10-CM

## 2019-11-20 DIAGNOSIS — R52 Pain, unspecified: Secondary | ICD-10-CM

## 2019-11-20 DIAGNOSIS — R4182 Altered mental status, unspecified: Secondary | ICD-10-CM

## 2019-11-20 DIAGNOSIS — E1122 Type 2 diabetes mellitus with diabetic chronic kidney disease: Secondary | ICD-10-CM | POA: Diagnosis present

## 2019-11-20 LAB — CBC WITH DIFFERENTIAL/PLATELET
Abs Immature Granulocytes: 0.03 10*3/uL (ref 0.00–0.07)
Basophils Absolute: 0 10*3/uL (ref 0.0–0.1)
Basophils Relative: 0 %
Eosinophils Absolute: 0.2 10*3/uL (ref 0.0–0.5)
Eosinophils Relative: 3 %
HCT: 44.5 % (ref 39.0–52.0)
Hemoglobin: 14.2 g/dL (ref 13.0–17.0)
Immature Granulocytes: 1 %
Lymphocytes Relative: 27 %
Lymphs Abs: 1.5 10*3/uL (ref 0.7–4.0)
MCH: 30.1 pg (ref 26.0–34.0)
MCHC: 31.9 g/dL (ref 30.0–36.0)
MCV: 94.5 fL (ref 80.0–100.0)
Monocytes Absolute: 0.4 10*3/uL (ref 0.1–1.0)
Monocytes Relative: 8 %
Neutro Abs: 3.3 10*3/uL (ref 1.7–7.7)
Neutrophils Relative %: 61 %
Platelets: 219 10*3/uL (ref 150–400)
RBC: 4.71 MIL/uL (ref 4.22–5.81)
RDW: 13.2 % (ref 11.5–15.5)
WBC: 5.5 10*3/uL (ref 4.0–10.5)
nRBC: 0 % (ref 0.0–0.2)

## 2019-11-20 LAB — URINALYSIS, ROUTINE W REFLEX MICROSCOPIC
Bilirubin Urine: NEGATIVE
Glucose, UA: NEGATIVE mg/dL
Hgb urine dipstick: NEGATIVE
Ketones, ur: NEGATIVE mg/dL
Leukocytes,Ua: NEGATIVE
Nitrite: NEGATIVE
Protein, ur: NEGATIVE mg/dL
Specific Gravity, Urine: 1.017 (ref 1.005–1.030)
pH: 5 (ref 5.0–8.0)

## 2019-11-20 LAB — COMPREHENSIVE METABOLIC PANEL
ALT: 25 U/L (ref 0–44)
AST: 21 U/L (ref 15–41)
Albumin: 4.2 g/dL (ref 3.5–5.0)
Alkaline Phosphatase: 89 U/L (ref 38–126)
Anion gap: 11 (ref 5–15)
BUN: 32 mg/dL — ABNORMAL HIGH (ref 8–23)
CO2: 22 mmol/L (ref 22–32)
Calcium: 9.8 mg/dL (ref 8.9–10.3)
Chloride: 109 mmol/L (ref 98–111)
Creatinine, Ser: 1.42 mg/dL — ABNORMAL HIGH (ref 0.61–1.24)
GFR calc Af Amer: 58 mL/min — ABNORMAL LOW (ref >60)
GFR calc non Af Amer: 50 mL/min — ABNORMAL LOW (ref >60)
Glucose, Bld: 121 mg/dL — ABNORMAL HIGH (ref 70–99)
Potassium: 4.2 mmol/L (ref 3.5–5.1)
Sodium: 142 mmol/L (ref 135–145)
Total Bilirubin: 1.1 mg/dL (ref 0.3–1.2)
Total Protein: 7.1 g/dL (ref 6.5–8.1)

## 2019-11-20 LAB — SARS CORONAVIRUS 2 BY RT PCR (HOSPITAL ORDER, PERFORMED IN ~~LOC~~ HOSPITAL LAB): SARS Coronavirus 2: NEGATIVE

## 2019-11-20 LAB — CBG MONITORING, ED: Glucose-Capillary: 170 mg/dL — ABNORMAL HIGH (ref 70–99)

## 2019-11-20 MED ORDER — ONDANSETRON HCL 4 MG PO TABS: 4.0000 mg | ORAL_TABLET | Freq: Four times a day (QID) | ORAL | Status: DC | PRN

## 2019-11-20 MED ORDER — ASPIRIN 300 MG RE SUPP
300.0000 mg | Freq: Every day | RECTAL | Status: DC
  Administered 2019-11-21: 300 mg via RECTAL
  Filled 2019-11-20: qty 1

## 2019-11-20 MED ORDER — INSULIN ASPART 100 UNIT/ML ~~LOC~~ SOLN
0.0000 [IU] | SUBCUTANEOUS | Status: DC
  Administered 2019-11-21 – 2019-11-22 (×2): 1 [IU] via SUBCUTANEOUS
  Administered 2019-11-22 (×2): 2 [IU] via SUBCUTANEOUS
  Administered 2019-11-23: 3 [IU] via SUBCUTANEOUS
  Administered 2019-11-23: 1 [IU] via SUBCUTANEOUS
  Administered 2019-11-23 (×2): 3 [IU] via SUBCUTANEOUS
  Administered 2019-11-23 – 2019-11-24 (×3): 2 [IU] via SUBCUTANEOUS
  Administered 2019-11-24: 3 [IU] via SUBCUTANEOUS
  Administered 2019-11-24: 2 [IU] via SUBCUTANEOUS
  Administered 2019-11-25: 1 [IU] via SUBCUTANEOUS
  Administered 2019-11-25 (×2): 2 [IU] via SUBCUTANEOUS
  Administered 2019-11-25: 3 [IU] via SUBCUTANEOUS
  Administered 2019-11-25: 1 [IU] via SUBCUTANEOUS
  Administered 2019-11-25: 2 [IU] via SUBCUTANEOUS
  Administered 2019-11-26: 1 [IU] via SUBCUTANEOUS
  Administered 2019-11-26 – 2019-11-27 (×5): 2 [IU] via SUBCUTANEOUS
  Administered 2019-11-27: 1 [IU] via SUBCUTANEOUS

## 2019-11-20 MED ORDER — LAMOTRIGINE 150 MG PO TABS: 150.0000 mg | ORAL_TABLET | Freq: Two times a day (BID) | ORAL | Status: DC

## 2019-11-20 MED ORDER — SODIUM CHLORIDE 0.9 % IV SOLN
200.0000 mg | Freq: Once | INTRAVENOUS | Status: AC
  Administered 2019-11-20: 200 mg via INTRAVENOUS
  Filled 2019-11-20: qty 20

## 2019-11-20 MED ORDER — ENOXAPARIN SODIUM 40 MG/0.4ML ~~LOC~~ SOLN
40.0000 mg | SUBCUTANEOUS | Status: DC
  Administered 2019-11-21: 40 mg via SUBCUTANEOUS
  Filled 2019-11-20: qty 0.4

## 2019-11-20 MED ORDER — HYDRALAZINE HCL 20 MG/ML IJ SOLN: 10.0000 mg | INTRAMUSCULAR | Status: DC | PRN

## 2019-11-20 MED ORDER — ONDANSETRON HCL 4 MG/2ML IJ SOLN: 4.0000 mg | Freq: Four times a day (QID) | INTRAMUSCULAR | Status: DC | PRN

## 2019-11-20 MED ORDER — SODIUM CHLORIDE 0.9 % IV SOLN
100.0000 mg | Freq: Two times a day (BID) | INTRAVENOUS | Status: DC
  Administered 2019-11-21 – 2019-11-22 (×3): 100 mg via INTRAVENOUS
  Filled 2019-11-20 (×6): qty 10

## 2019-11-20 NOTE — ED Notes (Signed)
EEG at bedside at this time.

## 2019-11-20 NOTE — Progress Notes (Signed)
EEG complete - results pending 

## 2019-11-20 NOTE — H&P (Addendum)
History and Physical    Douglas [name]. TIW:[id number] DOB: April 26, 1951 DOA: 11/20/2019  PCP: Ralene Ok, MD  Patient coming from: Home.  Chief Complaint: Seizures.  History obtained from patient's daughter.  HPI: Douglas [name]. is a 69 y.o. male with history of right-sided hemiplegia from previous stroke, hypertension, diabetes mellitus type 2 was noticed to have staring spells as per the patient's daughter.  The first episode was around 48 hours ago in the evening which lasted for few seconds and then got corrected.  The next one was noticed this morning when patient woke up.  Patient was not making anything since then it was sounding.  Later on patient went to sleep and when patient's daughter attempted to wake him up around 2:00 he was seen dogs which are nonexistent.  At this point patient was brought to the ER.  ED Course: In the ER CT head is unremarkable.  There was some concern for status and neurologist on-call was consulted patient was given increased dose of Lamictal from 1 25-1 50.  And also 1 dose of Vimpat was given.    Review of Systems: As per HPI, rest all negative.   Past Medical History:  Diagnosis Date  . Acute blood loss anemia   . Acute embolic stroke (HCC)   . Acute lower UTI   . AMS (altered mental status) 06/05/2019  . Cerebral edema (HCC) 04/04/2018  . Diabetes 1.5, managed as type 2 (HCC)   . Diabetes mellitus without complication (HCC)   . History of CVA with residual deficit   . History of Dysphagia, post-stroke   . Hypertension   . Ischemic stroke (HCC) 03/05/2019  . Labile blood glucose   . 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 Renal insufficiency 04/04/2018  . Stroke (HCC)   . UTI (urinary tract infection) 03/05/2019    Past Surgical History:  Procedure Laterality Date  . IR PATIENT EVAL TECH 0-60 MINS  03/30/2018  . LOOP RECORDER INSERTION N/A 03/08/2019   Procedure: LOOP RECORDER INSERTION;  Surgeon: Regan Lemming, MD;  Location: [facility] INVASIVE CV LAB;  Service: Cardiovascular;  Laterality: N/A;  . TEE WITHOUT CARDIOVERSION N/A 04/02/2018   Procedure: TRANSESOPHAGEAL ECHOCARDIOGRAM (TEE);  Surgeon: [name], MD;  Location: [facility] ENDOSCOPY;  Service: Cardiovascular;  Laterality: N/A;     reports that he has never smoked. He has never used smokeless tobacco. He reports previous alcohol use. He reports that he does not use drugs.  Allergies  Allergen Reactions  . Ciprofloxacin Anaphylaxis  . Keppra [Levetiracetam] Other (See Comments)    Makes the patient very agitated and "not like himself"  . Avocado Nausea And Vomiting  . Metformin And Related Other (See Comments)    Renal issue    Family History  Problem Relation Age of Onset  . Hypertension Mother   . Hypertension Father     Prior to Admission medications   Medication Sig Start Date End Date Taking? Authorizing Provider  amLODipine (NORVASC) 10 MG tablet Take 1 tablet (10 mg total) by mouth daily. 08/30/19  Yes Margit Hanks, MD  ascorbic acid (VITAMIN C) 500 MG tablet Take 1 tablet (500 mg total) by mouth daily as needed (if cold-like symptoms are present). Patient taking differently: Take 500 mg by mouth daily.  08/30/19  Yes Margit Hanks, MD  aspirin EC 325 MG tablet Take 325 mg by mouth every morning.   Yes [provider]  atorvastatin (  LIPITOR) 40 MG tablet Take 1 tablet (40 mg total) by mouth daily at 6 PM. Patient taking differently: Take 40 mg by mouth at bedtime.  06/21/19  Yes Autry-Lott, Randa Evens, DO  baclofen (LIORESAL) 10 MG tablet Take 2 tablets (20 mg total) by mouth 2 (two) times daily. Patient taking differently: Take 20 mg by mouth in the morning and at bedtime.  08/30/19  Yes Margit Hanks, MD  bisacodyl (DULCOLAX) 10 MG suppository Place 1 suppository (10 mg total) rectally 2 (two) times daily. 11/19/19  Yes Elgergawy, Leana Roe, MD  carvedilol (COREG) 12.5 MG tablet Take 1 tablet (12.5 mg total)  by mouth 2 (two) times daily with a meal. Patient taking differently: Take 12.5 mg by mouth in the morning and at bedtime.  08/30/19  Yes Margit Hanks, MD  Cholecalciferol (VITAMIN D3 PO) Take 1 tablet by mouth daily.    Yes [provider]  clopidogrel (PLAVIX) 75 MG tablet Take 1 tablet (75 mg total) by mouth daily. 08/30/19  Yes Margit Hanks, MD  glipiZIDE (GLUCOTROL) 10 MG tablet Take 10 mg by mouth 2 (two) times daily with a meal. 08/27/19  Yes [provider]  indapamide (LOZOL) 1.25 MG tablet Take 1 tablet (1.25 mg total) by mouth daily. 08/30/19  Yes Margit Hanks, MD  insulin aspart (NOVOLOG) 100 UNIT/ML injection Inject 2-15 Units into the skin 4 (four) times daily -  before meals and at bedtime. SSI:  121-150 = 2 units; 151-200 = 3 units, 201-250 = 5 units, 251-300 = 8 units, 301-350 = 11 units, 351-300 = 15 units, greater than 400 = 15 units and call MD 08/30/19  Yes Margit Hanks, MD  insulin detemir (LEVEMIR) 100 UNIT/ML injection Inject 0.16 mLs (16 Units total) into the skin daily. If CBG >125 (per daughter) Patient taking differently: Inject 16 Units into the skin daily as needed (CBG >125).  08/30/19  Yes Margit Hanks, MD  lamoTRIgine (LAMICTAL) 25 MG tablet Take 5 tablets (125 mg total) by mouth 2 (two) times daily. Five tablets Patient taking differently: Take 100 mg by mouth in the morning and at bedtime.  08/30/19  Yes Margit Hanks, MD  lisinopril (ZESTRIL) 20 MG tablet Take 1 tablet (20 mg total) by mouth 2 (two) times daily. Patient taking differently: Take 20 mg by mouth See admin instructions. Take one tablet (20 mg) by mouth twice daily - 10am and 3pm 08/30/19  Yes Margit Hanks, MD  midodrine (PROAMATINE) 5 MG tablet Take 1 tablet (5 mg total) by mouth in the morning, at noon, and at bedtime. FOR ORTHOSTATIC HYPOTENSION 08/30/19  Yes Margit Hanks, MD  Nystatin (GERHARDT'S BUTT CREAM) CREA Apply 1 application topically daily.    Yes [provider]  senna-docusate (SENOKOT-S) 8.6-50 MG tablet Take 1 tablet by mouth daily. 11/19/19  Yes Elgergawy, Leana Roe, MD  liver oil-zinc oxide (DESITIN) 40 % ointment Apply topically 2 (two) times daily as needed for irritation (for protection). Patient not taking: Reported on 11/18/2019 08/30/19   Margit Hanks, MD  pantoprazole (PROTONIX) 40 MG tablet Take 1 tablet (40 mg total) by mouth daily. Patient not taking: Reported on 11/18/2019 08/30/19   Margit Hanks, MD  polyethylene glycol (MIRALAX) 17 g packet Take 17 g by mouth 2 (two) times daily for 5 days. Patient not taking: Reported on 11/20/2019 11/19/19 11/24/19  Elgergawy, Leana Roe, MD  tamsulosin (FLOMAX) 0.4 MG CAPS capsule Take 1 capsule (  0.4 mg total) by mouth daily after supper. Patient not taking: Reported on 11/18/2019 08/30/19   Margit HanksAlexander, Anne D, MD  Vitamin D3 (VITAMIN D) 25 MCG tablet Take 0.5 tablets (500 Units total) by mouth daily. Patient not taking: Reported on 11/18/2019 08/30/19   Margit HanksAlexander, Anne D, MD    Physical Exam: Constitutional: Moderately built and nourished. Vitals:   11/20/19 1934 11/20/19 1935 11/20/19 2215 11/20/19 2230  BP:   138/84 139/77  Pulse: 65 65 70 67  Resp: (!) 7 12 17 14   Temp:      TempSrc:      SpO2: 100% 99% 100% 100%  Weight:      Height:       Eyes: Anicteric no pallor. ENMT: No discharge from the ears eyes nose or mouth. Neck: No mass felt. Respiratory: No rhonchi or crepitations. Cardiovascular: S1-S2 heard. Abdomen: Soft nontender bowel sounds present. Musculoskeletal: No edema. Skin: No rash. Neurologic: Alert awake oriented time place and person.  Moves all extremities. Psychiatric: Appears normal per normal affect.   Labs on Admission: I have personally reviewed following labs and imaging studies  CBC: Recent Labs  Lab 11/18/19 2153 11/20/19 1802  WBC 5.5 5.5  NEUTROABS 3.4 3.3  HGB 13.8 14.2  HCT 42.4 44.5  MCV 93.2 94.5  PLT 232 219    Basic Metabolic Panel: Recent Labs  Lab 11/18/19 2153 11/20/19 1802  NA 142 142  K 3.7 4.2  CL 106 109  CO2 27 22  GLUCOSE 89 121*  BUN 16 32*  CREATININE 1.02 1.42*  CALCIUM 9.6 9.8   GFR: Estimated Creatinine Clearance: 53.9 mL/min (A) (by C-G formula based on SCr of 1.42 mg/dL (H)). Liver Function Tests: Recent Labs  Lab 11/18/19 2153 11/20/19 1802  AST 20 21  ALT 28 25  ALKPHOS 95 89  BILITOT 0.7 1.1  PROT 7.2 7.1  ALBUMIN 4.0 4.2   Recent Labs  Lab 11/18/19 2153  LIPASE 26   No results for input(s): AMMONIA in the last 168 hours. Coagulation Profile: No results for input(s): INR, PROTIME in the last 168 hours. Cardiac Enzymes: No results for input(s): CKTOTAL, CKMB, CKMBINDEX, TROPONINI in the last 168 hours. BNP (last 3 results) No results for input(s): PROBNP in the last 8760 hours. HbA1C: No results for input(s): HGBA1C in the last 72 hours. CBG: Recent Labs  Lab 11/20/19 1438  GLUCAP 170*   Lipid Profile: No results for input(s): CHOL, HDL, LDLCALC, TRIG, CHOLHDL, LDLDIRECT in the last 72 hours. Thyroid Function Tests: No results for input(s): TSH, T4TOTAL, FREET4, T3FREE, THYROIDAB in the last 72 hours. Anemia Panel: No results for input(s): VITAMINB12, FOLATE, FERRITIN, TIBC, IRON, RETICCTPCT in the last 72 hours. Urine analysis:    Component Value Date/Time   COLORURINE YELLOW 11/20/2019 1437   APPEARANCEUR HAZY (A) 11/20/2019 1437   LABSPEC 1.017 11/20/2019 1437   PHURINE 5.0 11/20/2019 1437   GLUCOSEU NEGATIVE 11/20/2019 1437   HGBUR NEGATIVE 11/20/2019 1437   BILIRUBINUR NEGATIVE 11/20/2019 1437   KETONESUR NEGATIVE 11/20/2019 1437   PROTEINUR NEGATIVE 11/20/2019 1437   NITRITE NEGATIVE 11/20/2019 1437   LEUKOCYTESUR NEGATIVE 11/20/2019 1437   Sepsis Labs: @LABRCNTIP (procalcitonin:4,lacticidven:4) ) Recent Results (from the past 240 hour(s))  SARS Coronavirus 2 by RT PCR (hospital order, [facility])  Nasopharyngeal Nasopharyngeal Swab     Status: None   Collection Time: 11/20/19  6:18 PM   Specimen: Nasopharyngeal Swab  Result Value Ref Range Status  SARS Coronavirus 2 NEGATIVE NEGATIVE Final    Comment: (NOTE) SARS-CoV-2 target nucleic acids are NOT DETECTED.  The SARS-CoV-2 RNA is generally detectable in upper and lower respiratory specimens during the acute phase of infection. The lowest concentration of SARS-CoV-2 viral copies this assay can detect is 250 copies / mL. A negative result does not preclude SARS-CoV-2 infection and should not be used as the sole basis for treatment or other patient management decisions.  A negative result may occur with improper specimen collection / handling, submission of specimen other than nasopharyngeal swab, presence of viral mutation(s) within the areas targeted by this assay, and inadequate number of viral copies (<250 copies / mL). A negative result must be combined with clinical observations, patient history, and epidemiological information.  Fact Sheet for Patients:   BoilerBrush.com.cy  Fact Sheet for Healthcare Providers: https://pope.com/  This test is not yet approved or  cleared by the [place] FDA and has been authorized for detection and/or diagnosis of SARS-CoV-2 by FDA under an Emergency Use Authorization (EUA).  This EUA will remain in effect (meaning this test can be used) for the duration of the COVID-19 declaration under Section 564(b)(1) of the Act, 21 U.S.C. section 360bbb-3(b)(1), unless the authorization is terminated or revoked sooner.  Performed at [facility], [address] [place], [place] [address]      Radiological Exams on Admission: CT Head Wo Contrast  Result Date: 11/20/2019 CLINICAL DATA:  Stroke suspected. Additional provided: Altered mental status. EXAM: CT HEAD WITHOUT CONTRAST TECHNIQUE: Contiguous axial images were obtained from the  base of the skull through the vertex without intravenous contrast. COMPARISON:  Prior head CT examinations 11/18/2019 and earlier FINDINGS: Brain: Redemonstrated extensive chronic infarction changes within the left MCA and PCA vascular territories. Ex vacuo dilatation of the left lateral ventricle. Stable background mild-to-moderate generalized parenchymal atrophy and moderate cerebral white matter chronic small vessel ischemic disease. No acute intracranial hemorrhage or acute demarcated cortical infarction is identified. No extra-axial fluid collection. No evidence of intracranial mass. No midline shift. Vascular: No hyperdense vessel.  Atherosclerotic calcifications. Skull: Normal. Negative for fracture or focal lesion. Sinuses/Orbits: Visualized orbits show no acute finding. No significant paranasal sinus disease or mastoid effusion at the imaged levels. IMPRESSION: No CT evidence of acute intracranial abnormality. Redemonstrated extensive chronic infarction changes within the left MCA and PCA vascular territories. Stable background mild-to-moderate generalized parenchymal atrophy and moderate cerebral white matter chronic small vessel ischemic disease. Electronically Signed   By: Jackey Loge DO   On: 11/20/2019 15:48   CT Head Wo Contrast  Result Date: 11/19/2019 CLINICAL DATA:  Headache, altered mental status, combativeness, prior infarct EXAM: CT HEAD WITHOUT CONTRAST TECHNIQUE: Contiguous axial images were obtained from the base of the skull through the vertex without intravenous contrast. COMPARISON:  CT 07/15/2019, MRI 06/17/2019 FINDINGS: Brain: Large left remote MCA and PCA territory infarcts are again identified. Moderate parenchymal volume loss. Extensive periventricular white matter changes are present likely reflecting the sequela of small vessel ischemia. Mild ex vacuo dilation of the left lateral ventricle. Ventricular size is normal. No evidence of acute intracranial hemorrhage or infarct. No  abnormal mass effect or midline shift. No abnormal intra or extra-axial mass lesion or fluid collection. Vascular: Extensive vascular calcifications are seen within the intracranial vertebral arteries and carotid siphons. Hyperdensity of the left MCA at the skull base is similar to prior examination and likely related to intracranial atherosclerotic disease. Skull: The calvarial vertex is not included  on this examination. The viscera is calvarium is intact. Sinuses/Orbits: The orbits are unremarkable. The paranasal sinuses are clear. Other: Mastoid air cells and middle ear cavities are clear. IMPRESSION: No evidence of acute intracranial hemorrhage or infarct. Large, remote left MCA and PCA territory infarct again noted. Extensive intracranial atherosclerotic disease. Electronically Signed   By: Helyn Numbers MD   On: 11/19/2019 00:07   DG Chest Portable 1 View  Result Date: 11/20/2019 CLINICAL DATA:  Altered mental status. Increased lethargy. Foul urine odor. EXAM: PORTABLE CHEST 1 VIEW COMPARISON:  11/18/2019 FINDINGS: The heart size and mediastinal contours are within normal limits. Loop recorder overlies the LEFT lung base. Both lungs are clear. The visualized skeletal structures are unremarkable. IMPRESSION: No active disease. Electronically Signed   By: Norva Pavlov M.D.   On: 11/20/2019 15:09   EEG LTVM - Continuous Bedside W/ Video Includes Portable EEG Read  Result Date: 11/20/2019 Charlsie Quest, MD     11/20/2019  8:47 PM Patient Name: Douglas [name]. MRN: [id number] Epilepsy Attending: Charlsie Quest Referring Physician/Provider: Dr Velta Addison Date: 11/20/2019 Duration: 21.73mins Patient history: 69 y.o. 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 Neuro exam with R plegia, not following commands. EEG  to evaluate for seizure. Level of alertness: Awake AEDs during EEG study: LTG, Vimpat Technical aspects: This EEG study was done with scalp electrodes positioned according to the 10-20 International system of electrode placement. Electrical activity was acquired at a sampling rate of 500Hz  and reviewed with a high frequency filter of 70Hz  and a low frequency filter of 1Hz . EEG data were recorded continuously and digitally stored. Description: The posterior dominant rhythm consists of 8-9 Hz activity of moderate voltage (25-35 uV) seen predominantly in posterior head regions, asymmetric ( L<R) and reactive to eye opening and eye closing. EEG showed continuous left hemispheric 3 to 6 Hz theta-delta slowing. Hyperventilation and photic stimulation were not performed.   ABNORMALITY -Continuous slow, generalized - Background asymmetry, left<right IMPRESSION: This study is suggestive of cortical dysfunction in left hemisphere likely secondary to underlying infarct and can also be seen in post-ictal state. No seizures or epileptiform discharges were seen throughout the recording.   CT Renal Stone Study  Result Date: 11/19/2019 CLINICAL DATA:  Decreased urine output. EXAM: CT ABDOMEN AND PELVIS WITHOUT CONTRAST TECHNIQUE: Multidetector CT imaging of the abdomen and pelvis was performed following the standard protocol without IV contrast. COMPARISON:  None. FINDINGS: Lower chest: No acute abnormality. Hepatobiliary: No focal liver abnormality is seen. A subcentimeter gallstone is seen within the gallbladder lumen without evidence of gallbladder wall thickening or biliary dilatation. Pancreas: Unremarkable. No pancreatic ductal dilatation or surrounding inflammatory changes. Spleen: Normal in size without focal abnormality. Adrenals/Urinary Tract: Adrenal glands are unremarkable. Kidneys are normal in size without focal lesions. Multiple 2 mm nonobstructing renal stones are seen within the left kidney. A  cluster of 3 mm and 4 mm nonobstructing renal stones are also seen within the lower pole of the left kidney. Bladder is unremarkable. Stomach/Bowel: Stomach is within normal limits. Appendix appears normal. A very large amount of stool is seen within the distal sigmoid colon and rectum. No evidence of bowel wall thickening, distention, or inflammatory changes. Vascular/Lymphatic: There is mild calcification of the abdominal aorta. No  enlarged abdominal or pelvic lymph nodes. Reproductive: Prostate is unremarkable. Other: No abdominal wall hernia or abnormality. No abdominopelvic ascites. Musculoskeletal: Marked severity degenerative changes seen at the level of L4-L5. IMPRESSION: 1. Cholelithiasis without evidence of cholecystitis. 2. Multiple nonobstructing renal stones within the left kidney. 3. Large amount of stool within the distal sigmoid colon and rectum. 4. Marked severity degenerative changes at the level of L4-L5. 5. Aortic atherosclerosis. Aortic Atherosclerosis (ICD10-I70.0). Electronically Signed   By: Aram Candela M.D.   On: 11/19/2019 01:54    EKG: Independently reviewed.  Normal sinus rhythm.  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 Seizures concerning for status for which neurology has been consulted.  Patient is receiving continuous the EEG.  Since patient failed swallow I discussed with on-call neurologist Dr. Amada Jupiter about ordering alternative to Lamictal.  Dr. Amada Jupiter advised to keep patient on Vimpat 100 mg IV every 12 until patient passes swallow and start Lamictal 150 mg twice daily as advised by the early neurologist. 2. Hypertension on as needed IV hydralazine since patient failed  swallow. 3. Hyperlipidemia on statins. 4. COPD continue inhalers. 5. Diabetes mellitus type 2 we will keep patient on sliding scale coverage. 6. History of stroke with l right-sided hemiplegia. 7. Chronic kidney disease stage III at think creatinine improved with hydration.  Since patient will need continuous EEG monitoring will need inpatient status.   DVT prophylaxis: Lovenox. Code Status: Full code. Family Communication: Patient's daughter. Disposition Plan: Home. Consults called: None  Admission status: Do not know.   Eduard Clos MD [facility] Pager [phone].  If 7PM-7AM, please contact night-coverage [email or web address] Password [email or web address]  11/20/2019, 11:46 PM

## 2019-11-20 NOTE — ED Triage Notes (Signed)
Pt brought to ED by [facility] from home for complain for increase lethargic and foul urine odor. Per EMS pt has a hx of right side weakness after old stroke and no able to talk. LSN per [facility] LSW yesterday at 23:00, waking up at 5 am yelling. BP 138/50, HR 68, R-12 SPO2 98% CBG 125.

## 2019-11-20 NOTE — ED Provider Notes (Signed)
4:35- Aphasia. Staring. Query seizure.  He is still not at his baseline.  Labs are pending.  Neurology plans on seeing the patient for evaluation.  6:20 PM- I discussed the case with the neuro hospitalist, Dr. Derry Lory.  He has ordered Vimpat 250 mg load for possible seizure causing current disability.  Since he is unable to rule out ongoing seizure, continues EEG has been ordered.  Additionally he recommends Lamictal being increased to 150 mg twice daily.  He suspects that seizure has been stimulated by a current infection such as UTI.  Clinical Course as of Nov 20 1854  Wed Nov 20, 2019  1826 Normal  CBC with Differential [EW]  1845 Normal except glucose high, BUN high, creatinine high, GFR low  Comprehensive metabolic panel(!) [EW]  1845 Normal  Urinalysis, Routine w reflex microscopic(!) [EW]  [id number] Per radiologist interpretation no acute changes.  CT Head Wo Contrast [EW]  1846 No infiltrate or edema, interpreted by me  DG Chest Portable 1 View [EW]    Clinical Course User Index [EW] Mancel Bale, MD    6:56 PM-Consult complete with hospitalist. Patient case explained and discussed.  He agrees to admit patient for further evaluation and treatment. Call ended at 7:40 PM       Mancel Bale, MD 11/20/19 1940

## 2019-11-20 NOTE — Procedures (Addendum)
Patient Name: Douglas [name].  MRN: [id number]  Epilepsy Attending: Charlsie Quest  Referring Physician/Provider: Dr Velta Addison Date: 11/20/2019  Duration: 21.45mins  Patient history: 69 y.o. 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 Neuro exam with R plegia, not following commands. EEG to evaluate for seizure.   Level of alertness: Awake  AEDs during EEG study: LTG, Vimpat  Technical aspects: This EEG study was done with scalp electrodes positioned according to the 10-20 International system of electrode placement. Electrical activity was acquired at a sampling rate of 500Hz  and reviewed with a high frequency filter of 70Hz  and a low frequency filter of 1Hz . EEG data were recorded continuously and digitally stored.   Description: The posterior dominant rhythm consists of 8-9 Hz activity of moderate voltage (25-35 uV) seen predominantly in posterior head regions, asymmetric ( L<R) and reactive to eye opening and eye closing. EEG showed continuous left hemispheric 3 to 6 Hz theta-delta slowing. Hyperventilation and photic stimulation were not performed.     ABNORMALITY -Continuous slow, left hemisphere - Background asymmetry, left<right  IMPRESSION: This study is suggestive of cortical dysfunction in left hemisphere likely secondary to underlying infarct and can also be seen in post-ictal state. No seizures or epileptiform discharges were seen throughout the recording.  Bernedette Auston 

## 2019-11-20 NOTE — ED Notes (Signed)
Pts daughter at bedside  

## 2019-11-20 NOTE — ED Notes (Signed)
IV attempted without success.  IV team consulted 

## 2019-11-20 NOTE — ED Provider Notes (Signed)
[facility] Provider Note   CSN: [id number] Arrival date & time: 11/20/19  1327     History Chief Complaint  Patient presents with  . Altered Mental Status    Douglas CordiaWilliam L Deckman Jr. is a 69 y.o. male.  HPI      Went to wake him thnis AM and he was just staring ahead, was responding but not acting normally., Usually eats with spoon and fork, but today just held spoon and kept staring.  5AM woke up to him yelling but then was looking around frantically when she went in to check Had to fight to get spoon out of his hand even though he wasn't using it Normally understands things very well, follows commands, knows what is happening. Normally has some speech difficulty from prior stroke, but has normal understanding, laughs at jokes etc Staring lasted approx 30 min  Came 2 days ago agitated, screaming, not sure if he was in pain, when ran tests he seemed to be better, was acting himself when he left and last night, was laughing at family feud, feeding self Right sided weakness but can transfer to chair/bear weight at baseline  This AM was different, altered, sleepy, staring-now gaze is not fixed but not acting himself No shaking  No new medications No cough/fever/vomiting/diarrhea No falls  Past Medical History:  Diagnosis Date  . Acute blood loss anemia   . Acute embolic stroke (HCC)   . Acute lower UTI   . AMS (altered mental status) 06/05/2019  . Cerebral edema (HCC) 04/04/2018  . Diabetes 1.5, managed as type 2 (HCC)   . Diabetes mellitus without complication (HCC)   . History of CVA with residual deficit   . History of Dysphagia, post-stroke   . Hypertension   . Ischemic stroke (HCC) 03/05/2019  . Labile blood glucose   . 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 Renal insufficiency 04/04/2018  . Stroke (HCC)   . UTI (urinary tract infection) 03/05/2019    Patient Active Problem List   Diagnosis Date Noted  . Seizure  (HCC) 11/20/2019  . Encephalopathy 11/19/2019  . Delayed orthostatic hypotension 08/04/2019  . Seizure disorder (HCC) 07/21/2019  . 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 Shoulder subluxation, right, sequela 07/16/2019  . Altered mental status 07/15/2019  . 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 Late effects of CVA (cerebrovascular accident)   . Pressure injury of skin 06/08/2019  . History of loop recorder 03/08/2019  . UTI (urinary tract infection) 03/05/2019  . 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 Global aphasia   . Type 2 diabetes mellitus with peripheral neuropathy (HCC)   . Hypertension associated with diabetes (HCC)   . Hyperlipidemia associated with type 2 diabetes mellitus (HCC)   . History of Dysphagia, post-stroke     Past Surgical History:  Procedure Laterality Date  . IR PATIENT EVAL TECH 0-60 MINS  03/30/2018  . LOOP RECORDER INSERTION N/A 03/08/2019   Procedure: LOOP RECORDER INSERTION;  Surgeon: Regan Lemmingamnitz, Will Martin, MD;  Location: [facility] INVASIVE CV LAB;  Service: Cardiovascular;  Laterality: N/A;  . TEE WITHOUT CARDIOVERSION N/A 04/02/2018   Procedure: TRANSESOPHAGEAL ECHOCARDIOGRAM (TEE);  Surgeon: Chilton Siandolph, Tiffany, MD;  [facility];  Service: Cardiovascular;  Laterality: N/A;       Family History  Problem Relation Age of Onset  . Hypertension Mother   . Hypertension Father  Social History   Tobacco Use  . Smoking status: Never Smoker  . Smokeless tobacco: Never Used  Vaping Use  . Vaping Use: Never used  Substance Use Topics  . Alcohol use: Not Currently  . Drug use: Never    Home Medications Prior to Admission medications   Medication Sig Start Date End Date Taking? Authorizing Provider  amLODipine (NORVASC) 10 MG tablet Take 1 tablet (10 mg total) by mouth daily. 08/30/19  Yes Margit Hanks, MD  ascorbic acid (VITAMIN C) 500 MG tablet Take  1 tablet (500 mg total) by mouth daily as needed (if cold-like symptoms are present). Patient taking differently: Take 500 mg by mouth daily.  08/30/19  Yes Margit Hanks, MD  aspirin EC 325 MG tablet Take 325 mg by mouth every morning.   Yes [provider]  atorvastatin (LIPITOR) 40 MG tablet Take 1 tablet (40 mg total) by mouth daily at 6 PM. Patient taking differently: Take 40 mg by mouth at bedtime.  06/21/19  Yes Autry-Lott, Randa Evens, DO  baclofen (LIORESAL) 10 MG tablet Take 2 tablets (20 mg total) by mouth 2 (two) times daily. Patient taking differently: Take 20 mg by mouth in the morning and at bedtime.  08/30/19  Yes Margit Hanks, MD  bisacodyl (DULCOLAX) 10 MG suppository Place 1 suppository (10 mg total) rectally 2 (two) times daily. 11/19/19  Yes Elgergawy, Leana Roe, MD  carvedilol (COREG) 12.5 MG tablet Take 1 tablet (12.5 mg total) by mouth 2 (two) times daily with a meal. Patient taking differently: Take 12.5 mg by mouth in the morning and at bedtime.  08/30/19  Yes Margit Hanks, MD  Cholecalciferol (VITAMIN D3 PO) Take 1 tablet by mouth daily.    Yes [provider]  clopidogrel (PLAVIX) 75 MG tablet Take 1 tablet (75 mg total) by mouth daily. 08/30/19  Yes Margit Hanks, MD  glipiZIDE (GLUCOTROL) 10 MG tablet Take 10 mg by mouth 2 (two) times daily with a meal. 08/27/19  Yes [provider]  indapamide (LOZOL) 1.25 MG tablet Take 1 tablet (1.25 mg total) by mouth daily. 08/30/19  Yes Margit Hanks, MD  insulin aspart (NOVOLOG) 100 UNIT/ML injection Inject 2-15 Units into the skin 4 (four) times daily -  before meals and at bedtime. SSI:  121-150 = 2 units; 151-200 = 3 units, 201-250 = 5 units, 251-300 = 8 units, 301-350 = 11 units, 351-300 = 15 units, greater than 400 = 15 units and call MD 08/30/19  Yes Margit Hanks, MD  insulin detemir (LEVEMIR) 100 UNIT/ML injection Inject 0.16 mLs (16 Units total) into the skin daily. If CBG >125 (per  daughter) Patient taking differently: Inject 16 Units into the skin daily as needed (CBG >125).  08/30/19  Yes Margit Hanks, MD  lamoTRIgine (LAMICTAL) 25 MG tablet Take 5 tablets (125 mg total) by mouth 2 (two) times daily. Five tablets Patient taking differently: Take 100 mg by mouth in the morning and at bedtime.  08/30/19  Yes Margit Hanks, MD  lisinopril (ZESTRIL) 20 MG tablet Take 1 tablet (20 mg total) by mouth 2 (two) times daily. Patient taking differently: Take 20 mg by mouth See admin instructions. Take one tablet (20 mg) by mouth twice daily - 10am and 3pm 08/30/19  Yes Margit Hanks, MD  midodrine (PROAMATINE) 5 MG tablet Take 1 tablet (5 mg total) by mouth in the morning, at noon, and at bedtime. FOR ORTHOSTATIC HYPOTENSION  08/30/19  Yes Margit Hanks, MD  Nystatin (GERHARDT'S BUTT CREAM) CREA Apply 1 application topically daily.   Yes [provider]  senna-docusate (SENOKOT-S) 8.6-50 MG tablet Take 1 tablet by mouth daily. 11/19/19  Yes Elgergawy, Leana Roe, MD  liver oil-zinc oxide (DESITIN) 40 % ointment Apply topically 2 (two) times daily as needed for irritation (for protection). Patient not taking: Reported on 11/18/2019 08/30/19   Margit Hanks, MD  pantoprazole (PROTONIX) 40 MG tablet Take 1 tablet (40 mg total) by mouth daily. Patient not taking: Reported on 11/18/2019 08/30/19   Margit Hanks, MD  polyethylene glycol (MIRALAX) 17 g packet Take 17 g by mouth 2 (two) times daily for 5 days. Patient not taking: Reported on 11/20/2019 11/19/19 11/24/19  Elgergawy, Leana Roe, MD  tamsulosin (FLOMAX) 0.4 MG CAPS capsule Take 1 capsule (0.4 mg total) by mouth daily after supper. Patient not taking: Reported on 11/18/2019 08/30/19   Margit Hanks, MD  Vitamin D3 (VITAMIN D) 25 MCG tablet Take 0.5 tablets (500 Units total) by mouth daily. Patient not taking: Reported on 11/18/2019 08/30/19   Margit Hanks, MD    Allergies    Ciprofloxacin, Keppra  [levetiracetam], Avocado, and Metformin and related  Review of Systems   Review of Systems  Physical Exam Updated Vital Signs BP 135/73   Pulse 65   Temp 98.3 F (36.8 C) (Oral)   Resp 12   Ht 6' (1.829 m)   Wt 78 kg   SpO2 99%   BMI 23.32 kg/m   Physical Exam Vitals and nursing note reviewed.  Constitutional:      General: He is not in acute distress.    Appearance: He is well-developed. He is not diaphoretic.  HENT:     Head: Normocephalic and atraumatic.  Eyes:     Conjunctiva/sclera: Conjunctivae normal.  Cardiovascular:     Rate and Rhythm: Normal rate and regular rhythm.  Pulmonary:     Effort: Pulmonary effort is normal. No respiratory distress.  Abdominal:     General: There is no distension.     Palpations: Abdomen is soft.     Tenderness: There is no abdominal tenderness. There is no guarding.  Musculoskeletal:     Cervical back: Normal range of motion.  Skin:    General: Skin is warm and dry.  Neurological:     Mental Status: He is alert.     Comments: Right sided weakness, apparent normal strength left, does not smile on command or follow EOM, follows intermittent commands      ED Results / Procedures / Treatments   Labs (all labs ordered are listed, but only abnormal results are displayed) Labs Reviewed  COMPREHENSIVE METABOLIC PANEL - Abnormal; Notable for the following components:      Result Value   Glucose, Bld 121 (*)    BUN 32 (*)    Creatinine, Ser 1.42 (*)    GFR calc non Af Amer 50 (*)    GFR calc Af Amer 58 (*)    All other components within normal limits  URINALYSIS, ROUTINE W REFLEX MICROSCOPIC - Abnormal; Notable for the following components:   APPearance HAZY (*)    All other components within normal limits  CBG MONITORING, ED - Abnormal; Notable for the following components:   Glucose-Capillary 170 (*)    All other components within normal limits  SARS CORONAVIRUS 2 BY RT PCR (HOSPITAL ORDER, PERFORMED IN  HOSPITAL  LAB)  URINE CULTURE  CBC WITH DIFFERENTIAL/PLATELET  LAMOTRIGINE LEVEL    EKG EKG Interpretation  Date/Time:  Wednesday November 20 2019 15:59:43 EDT Ventricular Rate:  66 PR Interval:    QRS Duration: 92 QT Interval:  387 QTC Calculation: 406 R Axis:   54 Text Interpretation: Sinus rhythm Borderline T wave abnormalities Borderline ST elevation, anterior leads Baseline wander in lead(s) V3 No significant change since last tracing Confirmed by Alvira Monday ([id number]) on 11/20/2019 4:37:39 PM   Radiology CT Head Wo Contrast  Result Date: 11/20/2019 CLINICAL DATA:  Stroke suspected. Additional provided: Altered mental status. EXAM: CT HEAD WITHOUT CONTRAST TECHNIQUE: Contiguous axial images were obtained from the base of the skull through the vertex without intravenous contrast. COMPARISON:  Prior head CT examinations 11/18/2019 and earlier FINDINGS: Brain: Redemonstrated extensive chronic infarction changes within the left MCA and PCA vascular territories. Ex vacuo dilatation of the left lateral ventricle. Stable background mild-to-moderate generalized parenchymal atrophy and moderate cerebral white matter chronic small vessel ischemic disease. No acute intracranial hemorrhage or acute demarcated cortical infarction is identified. No extra-axial fluid collection. No evidence of intracranial mass. No midline shift. Vascular: No hyperdense vessel.  Atherosclerotic calcifications. Skull: Normal. Negative for fracture or focal lesion. Sinuses/Orbits: Visualized orbits show no acute finding. No significant paranasal sinus disease or mastoid effusion at the imaged levels. IMPRESSION: No CT evidence of acute intracranial abnormality. Redemonstrated extensive chronic infarction changes within the left MCA and PCA vascular territories. Stable background mild-to-moderate generalized parenchymal atrophy and moderate cerebral white matter chronic small vessel ischemic disease. Electronically Signed   By: Jackey Loge DO   On: 11/20/2019 15:48   CT Head Wo Contrast  Result Date: 11/19/2019 CLINICAL DATA:  Headache, altered mental status, combativeness, prior infarct EXAM: CT HEAD WITHOUT CONTRAST TECHNIQUE: Contiguous axial images were obtained from the base of the skull through the vertex without intravenous contrast. COMPARISON:  CT 07/15/2019, MRI 06/17/2019 FINDINGS: Brain: Large left remote MCA and PCA territory infarcts are again identified. Moderate parenchymal volume loss. Extensive periventricular white matter changes are present likely reflecting the sequela of small vessel ischemia. Mild ex vacuo dilation of the left lateral ventricle. Ventricular size is normal. No evidence of acute intracranial hemorrhage or infarct. No abnormal mass effect or midline shift. No abnormal intra or extra-axial mass lesion or fluid collection. Vascular: Extensive vascular calcifications are seen within the intracranial vertebral arteries and carotid siphons. Hyperdensity of the left MCA at the skull base is similar to prior examination and likely related to intracranial atherosclerotic disease. Skull: The calvarial vertex is not included on this examination. The viscera is calvarium is intact. Sinuses/Orbits: The orbits are unremarkable. The paranasal sinuses are clear. Other: Mastoid air cells and middle ear cavities are clear. IMPRESSION: No evidence of acute intracranial hemorrhage or infarct. Large, remote left MCA and PCA territory infarct again noted. Extensive intracranial atherosclerotic disease. Electronically Signed   By: Helyn Numbers MD   On: 11/19/2019 00:07   DG Chest Portable 1 View  Result Date: 11/20/2019 CLINICAL DATA:  Altered mental status. Increased lethargy. Foul urine odor. EXAM: PORTABLE CHEST 1 VIEW COMPARISON:  11/18/2019 FINDINGS: The heart size and mediastinal contours are within normal limits. Loop recorder overlies the LEFT lung base. Both lungs are clear. The visualized skeletal structures  are unremarkable. IMPRESSION: No active disease. Electronically Signed   By: Norva Pavlov M.D.   On: 11/20/2019 15:09   EEG LTVM - Continuous Bedside W/ Video Includes Portable EEG Read  Result Date: 11/20/2019  Charlsie Quest, MD     11/20/2019  8:47 PM Patient Name: Douglas [name]. MRN: [id number] Epilepsy Attending: Charlsie Quest Referring Physician/Provider: Dr Velta Addison Date: 11/20/2019 Duration: 21.70mins Patient history: 69 y.o. 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 Neuro exam with R plegia, not following commands. EEG to evaluate for seizure. Level of alertness: Awake AEDs during EEG study: LTG, Vimpat Technical aspects: This EEG study was done with scalp electrodes positioned according to the 10-20 International system of electrode placement. Electrical activity was acquired at a sampling rate of 500Hz  and reviewed with a high frequency filter of 70Hz  and a low frequency filter of 1Hz . EEG data were recorded continuously and digitally stored. Description: The posterior dominant rhythm consists of 8-9 Hz activity of moderate voltage (25-35 uV) seen predominantly in posterior head regions, asymmetric ( L<R) and reactive to eye opening and eye closing. EEG showed continuous left hemispheric 3 to 6 Hz theta-delta slowing. Hyperventilation and photic stimulation were not performed.   ABNORMALITY -Continuous slow, generalized - Background asymmetry, left<right IMPRESSION: This study is suggestive of cortical dysfunction in left hemisphere likely secondary to underlying infarct and can also be seen in post-ictal state. No seizures or epileptiform discharges were seen throughout the recording.   CT Renal Stone Study  Result Date: 11/19/2019 CLINICAL DATA:  Decreased urine output. EXAM: CT  ABDOMEN AND PELVIS WITHOUT CONTRAST TECHNIQUE: Multidetector CT imaging of the abdomen and pelvis was performed following the standard protocol without IV contrast. COMPARISON:  None. FINDINGS: Lower chest: No acute abnormality. Hepatobiliary: No focal liver abnormality is seen. A subcentimeter gallstone is seen within the gallbladder lumen without evidence of gallbladder wall thickening or biliary dilatation. Pancreas: Unremarkable. No pancreatic ductal dilatation or surrounding inflammatory changes. Spleen: Normal in size without focal abnormality. Adrenals/Urinary Tract: Adrenal glands are unremarkable. Kidneys are normal in size without focal lesions. Multiple 2 mm nonobstructing renal stones are seen within the left kidney. A cluster of 3 mm and 4 mm nonobstructing renal stones are also seen within the lower pole of the left kidney. Bladder is unremarkable. Stomach/Bowel: Stomach is within normal limits. Appendix appears normal. A very large amount of stool is seen within the distal sigmoid colon and rectum. No evidence of bowel wall thickening, distention, or inflammatory changes. Vascular/Lymphatic: There is mild calcification of the abdominal aorta. No enlarged abdominal or pelvic lymph nodes. Reproductive: Prostate is unremarkable. Other: No abdominal wall hernia or abnormality. No abdominopelvic ascites. Musculoskeletal: Marked severity degenerative changes seen at the level of L4-L5. IMPRESSION: 1. Cholelithiasis without evidence of cholecystitis. 2. Multiple nonobstructing renal stones within the left kidney. 3. Large amount of stool within the distal sigmoid colon and rectum. 4. Marked severity degenerative changes at the level of L4-L5. 5. Aortic atherosclerosis. Aortic Atherosclerosis (ICD10-I70.0). Electronically Signed   By: M.D.   On: 11/19/2019 01:54    Procedures Procedures (including critical care time)  Medications Ordered in ED Medications  lamoTRIgine (LAMICTAL)  tablet 150 mg (has no administration in time range)  lacosamide (VIMPAT) 200 mg in sodium chloride 0.9 % 25 mL IVPB (0 mg Intravenous Stopped 11/20/19 1947)    ED Course  I have reviewed the triage vital signs and the nursing notes.  Pertinent labs & imaging results that  were available during my care of the patient were reviewed by me and considered in my medical decision making (see chart for details).  Clinical Course as of Nov 20 2047  Wed Nov 20, 2019  1826 Normal  CBC with Differential [EW]  1845 Normal except glucose high, BUN high, creatinine high, GFR low  Comprehensive metabolic panel(!) [EW]  1845 Normal  Urinalysis, Routine w reflex microscopic(!) [EW]  [id number] Per radiologist interpretation no acute changes.  CT Head Wo Contrast [EW]  1846 No infiltrate or edema, interpreted by me  DG Chest Portable 1 View [EW]    Clinical Course User Index [EW] Mancel Bale, MD   MDM Rules/Calculators/A&P                          [phone] male with history of CVA with residual right sided weakness and aphasia, seizures, who presents with concern for altered mental status, episode of staring.  DDx includes encephalopathy secondary to electrolyte abnormality, medications, infection or partial seizure.  CT head without signs of acute abnormalities. Consulted Neruology for possible seizure. Labs and UA pending. Anticipate admission. Signed out to Dr. Effie Shy.    Final Clinical Impression(s) / ED Diagnoses Final diagnoses:  Altered mental status, unspecified altered mental status type    Rx / DC Orders ED Discharge Orders    None       Alvira Monday, MD 11/20/19 2052

## 2019-11-20 NOTE — Consult Note (Signed)
Date of service: November 20, 2019 Patient Name: Douglas [name]. MRN: [id number] DOB: [birth year] Reason for consult: "concern for seizures"  Douglas [name]. is a 69 y.o. 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  Patient unable to provide anty meaningful hx. Per daughter, he was starring off and on into space. He had 1 fixed gaze at the ceiling. She attempted to raise his hand. He would hold medications in mouth and not swallow. He would not eat, was just sitting there with food in front of him. He chewed the first bite. Daughter stepped out and he was showelling the food into his mouth. When daughter told him not to do that, patient pushed her away. He grabbed the spoon and just sat there. Throughout all of this, he kept starring off in space, the starring off lasted about 30 mins or so. He could not figure out what to do with the spoon.  Daughter called EMS and they had to pry the spoon out. Per daughter cough sounds a little watery. His urine has a really strong odor. He has not missed any of his medications over the last few days. He has been more tired over the last 2 days and has been sleeping more.  OS ROS: Unable to obtain 2/2 Aphasia and AMS.  Past History Past Medical History:  Diagnosis Date  . Acute blood loss anemia   . Acute embolic stroke ([place])   . Acute lower UTI   . AMS (altered mental status) 06/05/2019  . Cerebral edema ([place]) 04/04/2018  . Diabetes 1.5, managed as type 2 ([place])   . Diabetes mellitus without complication ([place])   . History of CVA with residual deficit   . History of Dysphagia, post-stroke   . Hypertension   . Ischemic stroke ([place]) 03/05/2019  . Labile blood glucose   . 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 Renal insufficiency 04/04/2018  . Stroke ([place])    . UTI (urinary tract infection) 03/05/2019   Past Surgical History:  Procedure Laterality Date  . IR PATIENT EVAL TECH 0-60 MINS  03/30/2018  . LOOP RECORDER INSERTION N/A 03/08/2019   Procedure: LOOP RECORDER INSERTION;  Surgeon: Constance Haw, MD;  Location: [facility];  Service: Cardiovascular;  Laterality: N/A;  . TEE WITHOUT CARDIOVERSION N/A 04/02/2018   Procedure: TRANSESOPHAGEAL ECHOCARDIOGRAM (TEE);  Surgeon: Skeet Latch, MD;  Location: [facility] ENDOSCOPY;  Service: Cardiovascular;  Laterality: N/A;   Family History  Problem Relation Age of Onset  . Hypertension Mother   . Hypertension Father    Social History   Tobacco Use  . Smoking status: Never Smoker  . Smokeless tobacco: Never Used  Vaping Use  . Vaping Use: Never used  Substance Use Topics  . Alcohol use: Not Currently  . Drug use: Never   Allergies  Allergen Reactions  . Ciprofloxacin Anaphylaxis  . Keppra [Levetiracetam] Other (See Comments)    Makes the patient very agitated and "not like himself"  . Avocado Nausea And Vomiting  . Metformin And Related Other (See Comments)    Renal issue     (Not in a hospital admission)    Temp:  [98.3 F (36.8 C)] 98.3 F (36.8 C) (07/21 1355) Pulse Rate:  [70-81] 70 (07/21 1355) Resp:  [  12-18] 12 (07/21 1355) BP: (137)/(76) 137/76 (07/21 1355) SpO2:  [96 %-100 %] 100 % (07/21 1355) Weight:  [78 kg] 78 kg (07/21 1332) Body mass index is 23.32 kg/m.  Physical Exam General: Laying comfortably in bed; in no acute distress. HENT: Normal oropharynx and mucosa. Normal external appearance of ears and nose. Neck: Supple, no pain or tenderness CV: RRR without murmur. No peripheral edema. Pulmonary: Clear to auscultation bilaterally. Normal respiratory effort. Abdomen: positive bowel sounds, soft, non-tender. Normal liver and spleen size. Ext: No cyanosis, edema, or deformity Skin: No rash. Normal palpation of skin. Musculoskeletal: full range of  motion; no joint tenderness. Normal digits and nails by inspection. No clubbing. Neurologic Examination Opens eyes to voice. Does not follow commands. Looks around. Speech: notable for global aphasia, does not attempt to mimic. Cranial nerves: Pupils equal round and reactive BL Looks to the left and right. R facial droop.  Does not move R arm or R leg. Noted to have contractures in R arm. Spontaneously moves L arm and L leg to antigravity.  Reflexes: Hyperreflexic on the R to 3, reflexes 2+ on the left. Babinski mute BL.  Impression and plan: Douglas [name]. is a 69 y.o. male with PMH significant for prior L MCA and PCA stroke with residual aphasia and R hemiplegia, hx of seizures on lamictal 124m BID who presents with a prolonged episode of starring off and lethargic over the last 2 days. Neuro exam with R plegia, not following commands.  Given his hx of seizures, would suspect that this event could be a prolonged focal seizure with retained awareness. Unclear what triggered this, he may have an underlying infection that could have lowered the threshold for seizure.  Recs: - Recommend Vimpat load of 2064mIV once. - Recommend cEEG. - Increase Lamictal to 15061mID - Further AED optimization pending EEG - Infectious workup with Chest XR, ESR, CRP, UA with UCx, serum procalcitonin. ______________________________________________________________________  Thank you for the opportunity to take part in the care of this patient. If you have any further questions, please contact the neurology consultation team.  Signed,  SalDonnetta Simpers

## 2019-11-20 NOTE — Progress Notes (Signed)
vLTM started following baseline EEG w/same leads.   Neurology notified.  Event button tested

## 2019-11-21 ENCOUNTER — Ambulatory Visit (HOSPITAL_COMMUNITY): Payer: Medicare Other | Admitting: *Deleted

## 2019-11-21 DIAGNOSIS — R569 Unspecified convulsions: Secondary | ICD-10-CM

## 2019-11-21 DIAGNOSIS — I639 Cerebral infarction, unspecified: Secondary | ICD-10-CM

## 2019-11-21 LAB — URINE CULTURE: Culture: <10000 — AB

## 2019-11-21 LAB — CBC
HCT: 40.6 % (ref 39.0–52.0)
HCT: 41.2 % (ref 39.0–52.0)
Hemoglobin: 12.8 g/dL — ABNORMAL LOW (ref 13.0–17.0)
Hemoglobin: 13 g/dL (ref 13.0–17.0)
MCH: 29.3 pg (ref 26.0–34.0)
MCH: 29.6 pg (ref 26.0–34.0)
MCHC: 31.5 g/dL (ref 30.0–36.0)
MCHC: 31.6 g/dL (ref 30.0–36.0)
MCV: 92.9 fL (ref 80.0–100.0)
MCV: 93.8 fL (ref 80.0–100.0)
Platelets: 222 10*3/uL (ref 150–400)
Platelets: 217 10*3/uL (ref 150–400)
RBC: 4.37 MIL/uL (ref 4.22–5.81)
RBC: 4.39 MIL/uL (ref 4.22–5.81)
RDW: 13.2 % (ref 11.5–15.5)
RDW: 13.3 % (ref 11.5–15.5)
WBC: 7.3 10*3/uL (ref 4.0–10.5)
WBC: 7 10*3/uL (ref 4.0–10.5)
nRBC: 0 % (ref 0.0–0.2)
nRBC: 0 % (ref 0.0–0.2)

## 2019-11-21 LAB — CUP PACEART REMOTE DEVICE CHECK
Date Time Interrogation Session: 20210721230636
Implantable Pulse Generator Implant Date: 20201106

## 2019-11-21 LAB — MAGNESIUM: Magnesium: 2 mg/dL (ref 1.7–2.4)

## 2019-11-21 LAB — CBG MONITORING, ED
Glucose-Capillary: 101 mg/dL — ABNORMAL HIGH (ref 70–99)
Glucose-Capillary: 121 mg/dL — ABNORMAL HIGH (ref 70–99)
Glucose-Capillary: 121 mg/dL — ABNORMAL HIGH (ref 70–99)

## 2019-11-21 LAB — BASIC METABOLIC PANEL
Anion gap: 9 (ref 5–15)
BUN: 24 mg/dL — ABNORMAL HIGH (ref 8–23)
CO2: 25 mmol/L (ref 22–32)
Calcium: 9.5 mg/dL (ref 8.9–10.3)
Chloride: 109 mmol/L (ref 98–111)
Creatinine, Ser: 1.22 mg/dL (ref 0.61–1.24)
GFR calc Af Amer: >60 mL/min (ref >60)
GFR calc non Af Amer: >60 mL/min (ref >60)
Glucose, Bld: 121 mg/dL — ABNORMAL HIGH (ref 70–99)
Potassium: 3.7 mmol/L (ref 3.5–5.1)
Sodium: 143 mmol/L (ref 135–145)

## 2019-11-21 LAB — HEPATIC FUNCTION PANEL
ALT: 23 U/L (ref 0–44)
AST: 15 U/L (ref 15–41)
Albumin: 3.9 g/dL (ref 3.5–5.0)
Alkaline Phosphatase: 82 U/L (ref 38–126)
Bilirubin, Direct: 0.1 mg/dL (ref 0.0–0.2)
Indirect Bilirubin: 0.9 mg/dL (ref 0.3–0.9)
Total Bilirubin: 1 mg/dL (ref 0.3–1.2)
Total Protein: 6.8 g/dL (ref 6.5–8.1)

## 2019-11-21 LAB — GLUCOSE, CAPILLARY
Glucose-Capillary: 102 mg/dL — ABNORMAL HIGH (ref 70–99)
Glucose-Capillary: 107 mg/dL — ABNORMAL HIGH (ref 70–99)
Glucose-Capillary: 133 mg/dL — ABNORMAL HIGH (ref 70–99)

## 2019-11-21 LAB — CREATININE, SERUM
Creatinine, Ser: 1.31 mg/dL — ABNORMAL HIGH (ref 0.61–1.24)
GFR calc Af Amer: >60 mL/min (ref >60)
GFR calc non Af Amer: 55 mL/min — ABNORMAL LOW (ref >60)

## 2019-11-21 LAB — LAMOTRIGINE LEVEL: Lamotrigine Lvl: 5.9 ug/mL (ref 2.0–20.0)

## 2019-11-21 MED ORDER — BACLOFEN 10 MG PO TABS
20.0000 mg | ORAL_TABLET | Freq: Two times a day (BID) | ORAL | Status: DC
  Administered 2019-11-22 – 2019-11-27 (×11): 20 mg via ORAL
  Filled 2019-11-21 (×3): qty 2
  Filled 2019-11-21: qty 1
  Filled 2019-11-21 (×8): qty 2

## 2019-11-21 MED ORDER — SODIUM CHLORIDE 0.9 % IV SOLN: INTRAVENOUS | Status: DC

## 2019-11-21 MED ORDER — INSULIN DETEMIR 100 UNIT/ML ~~LOC~~ SOLN
16.0000 [IU] | Freq: Every day | SUBCUTANEOUS | Status: DC
  Administered 2019-11-22 – 2019-11-23 (×2): 16 [IU] via SUBCUTANEOUS
  Filled 2019-11-21 (×4): qty 0.16

## 2019-11-21 MED ORDER — SENNOSIDES-DOCUSATE SODIUM 8.6-50 MG PO TABS: 1.0000 | ORAL_TABLET | Freq: Every day | ORAL | Status: DC

## 2019-11-21 MED ORDER — ATORVASTATIN CALCIUM 40 MG PO TABS
40.0000 mg | ORAL_TABLET | Freq: Every day | ORAL | Status: DC
  Administered 2019-11-22 – 2019-11-26 (×5): 40 mg via ORAL
  Filled 2019-11-21 (×5): qty 1

## 2019-11-21 MED ORDER — HYDRALAZINE HCL 20 MG/ML IJ SOLN: 10.0000 mg | Freq: Four times a day (QID) | INTRAMUSCULAR | Status: DC | PRN

## 2019-11-21 MED ORDER — CLOPIDOGREL BISULFATE 75 MG PO TABS
75.0000 mg | ORAL_TABLET | Freq: Every day | ORAL | Status: DC
  Administered 2019-11-22 – 2019-11-27 (×6): 75 mg via ORAL
  Filled 2019-11-21 (×6): qty 1

## 2019-11-21 MED ORDER — BISACODYL 10 MG RE SUPP
10.0000 mg | Freq: Two times a day (BID) | RECTAL | Status: DC
  Administered 2019-11-21 (×2): 10 mg via RECTAL
  Filled 2019-11-21 (×2): qty 1

## 2019-11-21 MED ORDER — TAMSULOSIN HCL 0.4 MG PO CAPS
0.4000 mg | ORAL_CAPSULE | Freq: Every day | ORAL | Status: DC
  Administered 2019-11-22 – 2019-11-26 (×5): 0.4 mg via ORAL
  Filled 2019-11-21 (×5): qty 1

## 2019-11-21 MED ORDER — CARVEDILOL 12.5 MG PO TABS
12.5000 mg | ORAL_TABLET | Freq: Two times a day (BID) | ORAL | Status: DC
  Administered 2019-11-22 – 2019-11-27 (×10): 12.5 mg via ORAL
  Filled 2019-11-21 (×3): qty 1
  Filled 2019-11-21: qty 4
  Filled 2019-11-21 (×7): qty 1

## 2019-11-21 NOTE — ED Notes (Signed)
Please call daughter Douglas [name] @ [phone]--states nurse told her her dad was agitated and she was checking to see if he calmed down--Douglas [name]

## 2019-11-21 NOTE — Progress Notes (Signed)
PROGRESS NOTE    Douglas [name].  RWE:[id number] DOB: August 06, 1950 DOA: 11/20/2019 PCP: Ralene Ok, MD   Brief Narrative:  HPI: Douglas [name]. is a 69 y.o. male with history of right-sided hemiplegia from previous stroke, hypertension, diabetes mellitus type 2 was noticed to have staring spells as per the patient's daughter.  The first episode was around 48 hours ago in the evening which lasted for few seconds and then got corrected.  The next one was noticed this morning when patient woke up.  Patient was not making anything since then it was sounding.  Later on patient went to sleep and when patient's daughter attempted to wake him up around 2:00 he was seen dogs which are nonexistent.  At this point patient was brought to the ER.  ED Course: In the ER CT head is unremarkable.  There was some concern for status and neurologist on-call was consulted patient was given increased dose of Lamictal from 1 25-1 50.  And also 1 dose of Vimpat was given.    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  Patient was loaded with Vimpat 200 mg IV and currently on 100 mg IV twice daily.  Neurology's note however indicates 150 mg twice daily.  Not sure if decision changed afterwards.  Will let neurology manage that dose.  EEG shows epileptiform activity in left frontal anterior temporal region as well as cortical dysfunction in the left hemisphere.  Management per neurology.  Essential hypertension: Blood pressure stable despite of him not being able to take any p.o. antihypertensives.  Monitor.  We will place him on as needed IV as any in the meantime.  Dysphagia: Failed bedside  swallow evaluation.  SLP consulted.  Hyperlipidemia: Resume atorvastatin when able to take p.o.  COPD: Stable.  Continue home inhalers.  Type 2 diabetes mellitus: Takes glipizide and 16 units of Lantus and sliding scale as well at home.  Blood sugar elevated here only on Lantus insulin.  Will resume 16 units of Lantus.  Hold glipizide.  Continue SSI.  History of stroke with right-sided hemiplegia: Stable.  Chronic kidney disease stage IIIa: At baseline.  Chronic diastolic congestive heart failure: Appears euvolemic.  Echo done in November 2020 shows grade 1 DD.  Now that he is n.p.o., will start him on gentle IV hydration.  DVT prophylaxis: enoxaparin (LOVENOX) injection 40 mg Start: 11/21/19 1000   Code Status: Full Code  Family Communication: None present at bedside.  Called and spoke to his daughter Cala Bradford Morrish and updated her.  Status is: Inpatient  Remains inpatient appropriate because:IV treatments appropriate due to intensity of illness or inability to take PO   Dispo: The patient is from: Home              Anticipated d/c is to: Home              Anticipated d/c date is: 2 days              Patient currently is not medically stable to d/c.    Estimated body mass index is 23.32 kg/m as calculated from the following:   Height as of this encounter:  6' (1.829 m).   Weight as of this encounter: 78 kg.  Pressure Injury 06/08/19 Buttocks Right;Left;Upper Stage 2 -  Partial thickness loss of dermis presenting as a shallow open injury with a red, pink wound bed without slough. (Active)  06/08/19 0645  Location: Buttocks  Location Orientation: Right;Left;Upper  Staging: Stage 2 -  Partial thickness loss of dermis presenting as a shallow open injury with a red, pink wound bed without slough.  Wound Description (Comments):   Present on Admission:      Nutritional status:               Consultants:   Neurology  Procedures:   None  Antimicrobials:    Anti-infectives (From admission, onward)   None         Subjective: Seen and examined this morning.  Patient aphasic at baseline.  He was able to nod.  He did not have any complaint.  Objective: Vitals:   11/21/19 0544 11/21/19 0745 11/21/19 1015 11/21/19 1031  BP: 130/82 128/75 118/67   Pulse: 76 66 69 67  Resp: 10 13 13 15   Temp:      TempSrc:      SpO2: 100% 99% 99% 100%  Weight:      Height:       No intake or output data in the 24 hours ending 11/21/19 1409 Filed Weights   11/20/19 1332  Weight: 78 kg    Examination:  General exam: Appears calm and comfortable  Respiratory system: Clear to auscultation. Respiratory effort normal. Cardiovascular system: S1 & S2 heard, RRR. No JVD, murmurs, rubs, gallops or clicks. No pedal edema. Gastrointestinal system: Abdomen is nondistended, soft and nontender. No organomegaly or masses felt. Normal bowel sounds heard. Central nervous system: Alert but unable to assess orientation due to being aphasic.  Right hemiplegia. Skin: No rashes, lesions or ulcers    Data Reviewed: I have personally reviewed following labs and imaging studies  CBC: Recent Labs  Lab 11/18/19 2153 11/20/19 1802 11/21/19 0147 11/21/19 0326  WBC 5.5 5.5 7.3 7.0  NEUTROABS 3.4 3.3  --   --   HGB 13.8 14.2 12.8* 13.0  HCT 42.4 44.5 40.6 41.2  MCV 93.2 94.5 92.9 93.8  PLT 232 219 222 217   Basic Metabolic Panel: Recent Labs  Lab 11/18/19 2153 11/20/19 1802 11/21/19 0147 11/21/19 0326  NA 142 142 143  --   K 3.7 4.2 3.7  --   CL 106 109 109  --   CO2 27 22 25   --   GLUCOSE 89 121* 121*  --   BUN 16 32* 24*  --   CREATININE 1.02 1.42* 1.22 1.31*  CALCIUM 9.6 9.8 9.5  --   MG  --   --  2.0  --    GFR: Estimated Creatinine Clearance: 58.4 mL/min (A) (by C-G formula based on SCr of 1.31 mg/dL (H)). Liver Function Tests: Recent Labs  Lab 11/18/19 2153 11/20/19 1802 11/21/19 0147  AST 20 21 15   ALT 28 25 23   ALKPHOS 95 89 82   BILITOT 0.7 1.1 1.0  PROT 7.2 7.1 6.8  ALBUMIN 4.0 4.2 3.9   Recent Labs  Lab 11/18/19 2153  LIPASE 26   No results for input(s): AMMONIA in the last 168 hours. Coagulation Profile: No results for input(s): INR, PROTIME in the last 168 hours. Cardiac Enzymes: No results for input(s): CKTOTAL, CKMB, CKMBINDEX, TROPONINI in the last 168 hours. BNP (last 3 results) No results  for input(s): PROBNP in the last 8760 hours. HbA1C: No results for input(s): HGBA1C in the last 72 hours. CBG: Recent Labs  Lab 11/20/19 1438 11/21/19 0023 11/21/19 0408 11/21/19 1124  GLUCAP 170* 121* 121* 101*   Lipid Profile: No results for input(s): CHOL, HDL, LDLCALC, TRIG, CHOLHDL, LDLDIRECT in the last 72 hours. Thyroid Function Tests: No results for input(s): TSH, T4TOTAL, FREET4, T3FREE, THYROIDAB in the last 72 hours. Anemia Panel: No results for input(s): VITAMINB12, FOLATE, FERRITIN, TIBC, IRON, RETICCTPCT in the last 72 hours. Sepsis Labs: No results for input(s): PROCALCITON, LATICACIDVEN in the last 168 hours.  Recent Results (from the past 240 hour(s))  SARS Coronavirus 2 by RT PCR (hospital order, performed in [facility] hospital lab) Nasopharyngeal Nasopharyngeal Swab     Status: None   Collection Time: 11/20/19  6:18 PM   Specimen: Nasopharyngeal Swab  Result Value Ref Range Status   SARS Coronavirus 2 NEGATIVE NEGATIVE Final    Comment: (NOTE) SARS-CoV-2 target nucleic acids are NOT DETECTED.  The SARS-CoV-2 RNA is generally detectable in upper and lower respiratory specimens during the acute phase of infection. The lowest concentration of SARS-CoV-2 viral copies this assay can detect is 250 copies / mL. A negative result does not preclude SARS-CoV-2 infection and should not be used as the sole basis for treatment or other patient management decisions.  A negative result may occur with improper specimen collection / handling, submission of specimen other than nasopharyngeal  swab, presence of viral mutation(s) within the areas targeted by this assay, and inadequate number of viral copies (<250 copies / mL). A negative result must be combined with clinical observations, patient history, and epidemiological information.  Fact Sheet for Patients:   BoilerBrush.com.cy  Fact Sheet for Healthcare Providers: https://pope.com/  This test is not yet approved or  cleared by the [place] FDA and has been authorized for detection and/or diagnosis of SARS-CoV-2 by FDA under an Emergency Use Authorization (EUA).  This EUA will remain in effect (meaning this test can be used) for the duration of the COVID-19 declaration under Section 564(b)(1) of the Act, 21 U.S.C. section 360bbb-3(b)(1), unless the authorization is terminated or revoked sooner.  Performed at [facility], [address], [place], [place] [address]       Radiology Studies: CT Head Wo Contrast  Result Date: 11/20/2019 CLINICAL DATA:  Stroke suspected. Additional provided: Altered mental status. EXAM: CT HEAD WITHOUT CONTRAST TECHNIQUE: Contiguous axial images were obtained from the base of the skull through the vertex without intravenous contrast. COMPARISON:  Prior head CT examinations 11/18/2019 and earlier FINDINGS: Brain: Redemonstrated extensive chronic infarction changes within the left MCA and PCA vascular territories. Ex vacuo dilatation of the left lateral ventricle. Stable background mild-to-moderate generalized parenchymal atrophy and moderate cerebral white matter chronic small vessel ischemic disease. No acute intracranial hemorrhage or acute demarcated cortical infarction is identified. No extra-axial fluid collection. No evidence of intracranial mass. No midline shift. Vascular: No hyperdense vessel.  Atherosclerotic calcifications. Skull: Normal. Negative for fracture or focal lesion. Sinuses/Orbits: Visualized orbits show no acute  finding. No significant paranasal sinus disease or mastoid effusion at the imaged levels. IMPRESSION: No CT evidence of acute intracranial abnormality. Redemonstrated extensive chronic infarction changes within the left MCA and PCA vascular territories. Stable background mild-to-moderate generalized parenchymal atrophy and moderate cerebral white matter chronic small vessel ischemic disease. Electronically Signed   By: Jackey Loge DO   On: 11/20/2019 15:48   DG Chest Portable  1 View  Result Date: 11/20/2019 CLINICAL DATA:  Altered mental status. Increased lethargy. Foul urine odor. EXAM: PORTABLE CHEST 1 VIEW COMPARISON:  11/18/2019 FINDINGS: The heart size and mediastinal contours are within normal limits. Loop recorder overlies the LEFT lung base. Both lungs are clear. The visualized skeletal structures are unremarkable. IMPRESSION: No active disease. Electronically Signed   By: Norva PavlovElizabeth  Brown M.D.   On: 11/20/2019 15:09   EEG LTVM - Continuous Bedside W/ Video Includes Portable EEG Read  Result Date: 11/20/2019 Charlsie QuestYadav, Priyanka O, MD     11/21/2019  7:40 AM Patient Name: Douglas CordiaWilliam L Hasegawa Jr. MRN: [id number] Epilepsy Attending: Charlsie QuestPriyanka O Yadav Referring Physician/Provider: Dr Velta AddisonSalman Khaliqdna Date: 11/20/2019 Duration: 21.4055mins Patient history: 69 y.o. 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 Neuro exam with R plegia, not following commands. EEG to evaluate for seizure. Level of alertness: Awake AEDs during EEG study: LTG, Vimpat Technical aspects: This EEG study was done with scalp electrodes positioned according to the 10-20 International system of electrode placement. Electrical activity was acquired at a sampling rate of 500Hz  and reviewed with a high frequency filter of 70Hz  and a low frequency filter of 1Hz . EEG data  were recorded continuously and digitally stored. Description: The posterior dominant rhythm consists of 8-9 Hz activity of moderate voltage (25-35 uV) seen predominantly in posterior head regions, asymmetric ( L<R) and reactive to eye opening and eye closing. EEG showed continuous left hemispheric 3 to 6 Hz theta-delta slowing. Hyperventilation and photic stimulation were not performed.   ABNORMALITY -Continuous slow, left hemisphere - Background asymmetry, left<right IMPRESSION: This study is suggestive of cortical dysfunction in left hemisphere likely secondary to underlying infarct and can also be seen in post-ictal state. No seizures or epileptiform discharges were seen throughout the recording. Charlsie QuestPriyanka O Yadav   CUP PACEART REMOTE DEVICE CHECK  Result Date: 11/21/2019 Carelink summary report received. Battery status OK. Normal device function. No new symptom episodes, tachy episodes, brady, or pause episodes. No new AF episodes. Monthly summary reports and ROV/PRN Hassell HalimMary Jo Lemke, RN, CCDS, [employer]   Scheduled Meds: . aspirin  300 mg Rectal Daily  . atorvastatin  40 mg Oral QHS  . baclofen  20 mg Oral BID  . bisacodyl  10 mg Rectal BID  . carvedilol  12.5 mg Oral BID WC  . clopidogrel  75 mg Oral Daily  . enoxaparin (LOVENOX) injection  40 mg Subcutaneous Q24H  . insulin aspart  0-9 Units Subcutaneous Q4H  . insulin detemir  16 Units Subcutaneous Daily  . senna-docusate  1 tablet Oral Daily  . tamsulosin  0.4 mg Oral QPC supper   Continuous Infusions: . lacosamide (VIMPAT) IV Stopped (11/21/19 [id number])     LOS: 1 day   Time spent: 35 minutes   Hughie Clossavi Jordana Dugue, MD [facility]  11/21/2019, 2:09 PM   To contact the attending provider between 7A-7P or the covering provider during after hours 7P-7A, please log into the web site [email or web address].

## 2019-11-21 NOTE — Progress Notes (Signed)
Subjective: On cEEG which demonstrates left frontal-anterior temporal region, maximal F7 and left hemispheric slowing and an asymmetrical background with left slower than right. Switched to IV Lacosamide as unable to take PO lamictal.  He is awake, aphasic but that is his baseline.  Objective:  Pulse Rate:  [65-81] 81 (07/22 1059) Resp:  [7-21] 21 (07/22 1059) BP: (118-147)/(67-84) 128/68 (07/22 1059) SpO2:  [98 %-100 %] 99 % (07/22 1059) Body mass index is 23.32 kg/m.  General: Laying comfortably in bed; in no acute distress. HENT: Normal oropharynx and mucosa. Normal external appearance of ears and nose Neck: Supple, no pain or tenderness. CV: RRR without murmur. No peripheral edema. Pulmonary: Symmetric chest rise. Normal respiratoy effort. Abdomen: positive bowel sounds, soft, non-tender. Normal liver and spleen size. Ext: No cyanosis, edema, or deformity. Skin: No rash. Normal palpation of skin. Musculoskeletal: Normal digits and nails by inspection. No Clubbing.  Neurologic Examination Awake, alert, does not follow commands but does seem to acknowldege my presence and shake my hand. Looks around. Speech: notable for global aphasia, does not attempt to mimic. Cranial nerves: Pupils equal round and reactive BL Looks to the left and right. R facial droop. Does not move R arm or R leg. Noted to have contractures in R arm. Spontaneously moves L arm and L leg to antigravity.  Reflexes: Hyperreflexic on the R to 3, reflexes 2+ on the left. Babinski mute BL.  Imaging and Diagnostics: CTH w/o contrast: demonstrates encephalomalacia in the L MCA and L PCA from prior infarcts.   Impression and plan: Douglas [name]. is a 69 y.o. male with PMH significant for prior L MCA and PCA stroke with residual aphasia and R hemiplegia, hx of seizures on lamictal 125mg  BID who presents with a 30 min episode of starring off and lethargy. Neuro exam with significantly improved wakefulness  and alertness today.  cEEG with no further seizures. No clear infectious or metabolic abnormality that could have triggered his event.  Recs: - Continue Vimpat 100mg  BID - If he passess his swallow eval with speech therapy tomorrow, recommend transitioning to Lamictal 150mg  BID. If we hold Lamictal for more than a few days, we will have to do a full 6 week titration schedule. - continue cEEG overnight, will take him off tomorrow morning - Give Ativan for seizure lasting over 2 mins.   Thank you for the opportunity to take part in the care of this patient. If you have any further questions, please contact the neurology consultation attending on call. Signed,   , MD. [facility] Pager Number 

## 2019-11-21 NOTE — Progress Notes (Signed)
Daughter of patient called and is concerned about his two medicaitons,  plavix and lipitor which were not given  because of his NPO,  Would like to know if the two med could be given in IV or suppository.  RN called and spoke with pharmacist,  The two medications only come as PO. RN also reassured that patient does has Aspirin given suppository.

## 2019-11-21 NOTE — Progress Notes (Signed)
EEG maintenance complete No skin breakdown at FP1 FP2 F7

## 2019-11-21 NOTE — Procedures (Addendum)
Patient Name: Douglas [name].  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 EEG to evaluate for seizure.   Level of alertness: Awake, asleep  AEDs during EEG study: LTG, Vimpat  Technical aspects: This EEG study was done with scalp electrodes positioned according to the 10-20 International system of electrode placement. Electrical activity was acquired at a sampling rate of 500Hz  and reviewed with a high frequency filter of 70Hz  and a low frequency filter of 1Hz . EEG data were recorded continuously and digitally stored.   Description: The posterior dominant rhythm consists of 8-9 Hz activity of moderate voltage (25-35 uV) seen predominantly in posterior head regions, asymmetric ( L<R) and reactive to eye opening and eye closing. Sleep was characterized by vertex waves, sleep spindles 912-14hz ), maximal frontocentral region. EEG showed continuous left hemispheric 3 to 6 Hz theta-delta slowing as well as spikes in left frontal-anterior temporal region, maximal F7 Hyperventilation and photic stimulation were not performed.     ABNORMALITY - Spike, left frontal-anterior temporal region, maximal F7 -Continuous slow, left hemisphere - Background asymmetry, left<right  IMPRESSION: This study is suggestive of eileptogenicity in left frontal-anterior temporal region as well as  cortical dysfunction in left hemisphere likely secondary to underlying infarct and can also be seen in post-ictal state. No seizures were seen throughout the recording.  Douglas [name] 

## 2019-11-22 ENCOUNTER — Inpatient Hospital Stay (HOSPITAL_COMMUNITY): Payer: Medicare Other

## 2019-11-22 LAB — BASIC METABOLIC PANEL
Anion gap: 11 (ref 5–15)
BUN: 16 mg/dL (ref 8–23)
CO2: 21 mmol/L — ABNORMAL LOW (ref 22–32)
Calcium: 9.3 mg/dL (ref 8.9–10.3)
Chloride: 113 mmol/L — ABNORMAL HIGH (ref 98–111)
Creatinine, Ser: 1.11 mg/dL (ref 0.61–1.24)
GFR calc Af Amer: >60 mL/min (ref >60)
GFR calc non Af Amer: >60 mL/min (ref >60)
Glucose, Bld: 180 mg/dL — ABNORMAL HIGH (ref 70–99)
Potassium: 3.8 mmol/L (ref 3.5–5.1)
Sodium: 145 mmol/L (ref 135–145)

## 2019-11-22 LAB — CBC WITH DIFFERENTIAL/PLATELET
Abs Immature Granulocytes: 0.03 10*3/uL (ref 0.00–0.07)
Basophils Absolute: 0 10*3/uL (ref 0.0–0.1)
Basophils Relative: 0 %
Eosinophils Absolute: 0.1 10*3/uL (ref 0.0–0.5)
Eosinophils Relative: 2 %
HCT: 38.6 % — ABNORMAL LOW (ref 39.0–52.0)
Hemoglobin: 12.4 g/dL — ABNORMAL LOW (ref 13.0–17.0)
Immature Granulocytes: 1 %
Lymphocytes Relative: 19 %
Lymphs Abs: 1.3 10*3/uL (ref 0.7–4.0)
MCH: 30.2 pg (ref 26.0–34.0)
MCHC: 32.1 g/dL (ref 30.0–36.0)
MCV: 94.1 fL (ref 80.0–100.0)
Monocytes Absolute: 0.5 10*3/uL (ref 0.1–1.0)
Monocytes Relative: 8 %
Neutro Abs: 4.6 10*3/uL (ref 1.7–7.7)
Neutrophils Relative %: 70 %
Platelets: 216 10*3/uL (ref 150–400)
RBC: 4.1 MIL/uL — ABNORMAL LOW (ref 4.22–5.81)
RDW: 13.4 % (ref 11.5–15.5)
WBC: 6.5 10*3/uL (ref 4.0–10.5)
nRBC: 0 % (ref 0.0–0.2)

## 2019-11-22 LAB — GLUCOSE, CAPILLARY
Glucose-Capillary: 106 mg/dL — ABNORMAL HIGH (ref 70–99)
Glucose-Capillary: 106 mg/dL — ABNORMAL HIGH (ref 70–99)
Glucose-Capillary: 139 mg/dL — ABNORMAL HIGH (ref 70–99)
Glucose-Capillary: 154 mg/dL — ABNORMAL HIGH (ref 70–99)
Glucose-Capillary: 195 mg/dL — ABNORMAL HIGH (ref 70–99)

## 2019-11-22 MED ORDER — LISINOPRIL 20 MG PO TABS
20.0000 mg | ORAL_TABLET | ORAL | Status: DC
  Administered 2019-11-22 – 2019-11-27 (×9): 20 mg via ORAL
  Filled 2019-11-22 (×8): qty 1

## 2019-11-22 MED ORDER — LAMOTRIGINE 25 MG PO TABS
150.0000 mg | ORAL_TABLET | Freq: Two times a day (BID) | ORAL | Status: DC
  Administered 2019-11-22 – 2019-11-27 (×11): 150 mg via ORAL
  Filled 2019-11-22 (×11): qty 2

## 2019-11-22 MED ORDER — RESOURCE THICKENUP CLEAR PO POWD
ORAL | Status: DC | PRN
  Filled 2019-11-22: qty 125

## 2019-11-22 MED ORDER — ASPIRIN EC 325 MG PO TBEC
325.0000 mg | DELAYED_RELEASE_TABLET | Freq: Every morning | ORAL | Status: DC
  Administered 2019-11-22 – 2019-11-27 (×6): 325 mg via ORAL
  Filled 2019-11-22 (×6): qty 1

## 2019-11-22 MED ORDER — LACOSAMIDE 50 MG PO TABS
100.0000 mg | ORAL_TABLET | Freq: Two times a day (BID) | ORAL | Status: DC
  Administered 2019-11-22 – 2019-11-24 (×4): 100 mg via ORAL
  Filled 2019-11-22 (×4): qty 2

## 2019-11-22 MED ORDER — AMLODIPINE BESYLATE 10 MG PO TABS
10.0000 mg | ORAL_TABLET | Freq: Every day | ORAL | Status: DC
  Administered 2019-11-22 – 2019-11-27 (×6): 10 mg via ORAL
  Filled 2019-11-22 (×6): qty 1

## 2019-11-22 NOTE — Progress Notes (Signed)
PROGRESS NOTE    Douglas CordiaWilliam L Vallandingham Jr.  ZOX:[id number] DOB: 29-Sep-1950 DOA: 11/20/2019 PCP: Ralene OkMoreira, Roy, MD   Brief Narrative:  Mr. Douglas CordiaWilliam L Aungst Jr. is a 69 y.o. male with history of right-sided hemiplegia from previous stroke, hypertension, diabetes mellitus type 2 was noticed to have staring spells as per the patient's daughter.  The first episode was around 48 hours ago in the evening which lasted for few seconds and then got corrected.  The next one was noticed the morning of presentation when patient woke up.  He was then brought to the ER at that point.  In the ER CT head was unremarkable.  There was some concern for status epilepticus and neurologist on-call was consulted patient was given increased dose of Lamictal and 1 dose of Vimpat was given.    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  Patient was loaded with Vimpat 200 mg IV and currently on 100 mg IV twice daily. cEEG shows epileptiform activity in left frontal anterior temporal region as well as cortical dysfunction in the left hemisphere.  Patient was on IV Vimpat since he was failing swallow evaluation.  Now status post modified barium swallow and per speech therapist/he can have a dysphagia 2 diet.  He can take all p.o. meds.  I had a long discussion with Dr. Derry LoryKhaliqdina of neurology who recommends increasing Lamictal to 150 mg twice daily and continuing Vimpat 100 mg p.o. twice daily for another day and discharging on Lamictal increased dose.  Essential hypertension: Blood pressure slightly elevated.  Now that he is able to take p.o., resume his home medications.  Monitor closely.  Dysphagia: Now cleared  to have dysphagia to diet by SLP.  Hyperlipidemia: Resume atorvastatin   COPD: Stable.  Continue home inhalers.  Type 2 diabetes mellitus: Takes glipizide and 16 units of Lantus and sliding scale as well at home.  Blood sugar better controlled.  Continue current dose of Lantus which is 16 units and SSI.  History of stroke with right-sided hemiplegia: Stable.  Chronic kidney disease stage IIIa: At baseline.  Chronic diastolic congestive heart failure: Appears euvolemic.  Echo done in November 2020 shows grade 1 DD.  Now that he is n.p.o., will continue gentle hydration as I am unsure how much p.o. he is going to take.  DVT prophylaxis:    Code Status: Full Code  Family Communication: None present at bedside.  Called and spoke to his daughter Douglas [name] and updated her.  I told her that patient is ready for discharge now that he is able to take p.o. however she claims that patient was able to stand up with minimal help at home which he was not able to do so prior to coming and she is requesting PT assessment.  I have consulted PT.  Please note that patient already has right hemiplegia.  Status is: Inpatient  Remains inpatient appropriate because: Unsafe DC plan   Dispo: The patient is from: Home              Anticipated d/c is to: Home              Anticipated d/c date is:  11/23/2019              Patient currently is medically stable to d/c.    Estimated body mass index is 23.32 kg/m as calculated from the following:   Height as of this encounter: 6' (1.829 m).   Weight as of this encounter: 78 kg.  Pressure Injury 06/08/19 Buttocks Right;Left;Upper Stage 2 -  Partial thickness loss of dermis presenting as a shallow open injury with a red, pink wound bed without slough. (Active)  06/08/19 0645  Location: Buttocks  Location Orientation: Right;Left;Upper  Staging: Stage 2 -  Partial thickness loss of dermis presenting as a shallow open injury with a red, pink wound bed without  slough.  Wound Description (Comments):   Present on Admission:      Nutritional status:               Consultants:   Neurology  Procedures:   None  Antimicrobials:  Anti-infectives (From admission, onward)   None         Subjective: Seen and examined.  Patient aphasic with right hemiplegia.  He was much more alert than yesterday.  Objective: Vitals:   11/21/19 2311 11/22/19 0326 11/22/19 0912 11/22/19 1151  BP: (!) 130/68 (!) 143/71 (!) 159/82 (!) 158/78  Pulse: 80 82 85 82  Resp: 18 17 19 17   Temp: 97.8 F (36.6 C) 98.3 F (36.8 C) 98 F (36.7 C) 98.9 F (37.2 C)  TempSrc: Oral Oral Oral Oral  SpO2: 100% 100% 100% 96%  Weight:      Height:        Intake/Output Summary (Last 24 hours) at 11/22/2019 1419 Last data filed at 11/21/2019 1547 Gross per 24 hour  Intake --  Output 750 ml  Net -750 ml   Filed Weights   11/20/19 1332  Weight: 78 kg    Examination:  General exam: Appears calm and comfortable  Respiratory system: Clear to auscultation. Respiratory effort normal. Cardiovascular system: S1 & S2 heard, RRR. No JVD, murmurs, rubs, gallops or clicks. No pedal edema. Gastrointestinal system: Abdomen is nondistended, soft and nontender. No organomegaly or masses felt. Normal bowel sounds heard. Central nervous system: Alert but unable to assess orientation since he is aphasic.  Right hemiplegia.  Data Reviewed: I have personally reviewed following labs and imaging studies  CBC: Recent Labs  Lab 11/18/19 2153 11/20/19 1802 11/21/19 0147 11/21/19 0326 11/22/19 1145  WBC 5.5 5.5 7.3 7.0 6.5  NEUTROABS 3.4 3.3  --   --  4.6  HGB 13.8 14.2 12.8* 13.0 12.4*  HCT 42.4 44.5 40.6 41.2 38.6*  MCV 93.2 94.5 92.9 93.8 94.1  PLT 232 219 222 217 216   Basic Metabolic Panel: Recent Labs  Lab 11/18/19 2153 11/20/19 1802 11/21/19 0147 11/21/19 0326 11/22/19 1145  NA 142 142 143  --  145  K 3.7 4.2 3.7  --  3.8  CL 106 109 109  --  113*   CO2 27 22 25   --  21*  GLUCOSE 89 121* 121*  --  180*  BUN 16 32* 24*  --  16  CREATININE 1.02 1.42* 1.22 1.31* 1.11  CALCIUM 9.6 9.8 9.5  --  9.3  MG  --   --  2.0  --   --    GFR: Estimated Creatinine Clearance: 68.9 mL/min (by C-G formula based on SCr of 1.11 mg/dL). Liver Function Tests: Recent Labs  Lab 11/18/19 2153 11/20/19 1802 11/21/19 0147  AST 20 21  15  ALT ALKPHOS 95 89 82  BILITOT 0.7 1.1 1.0  PROT 7.2 7.1 6.8  ALBUMIN 4.0 4.2 3.9   Recent Labs  Lab 11/18/19 2153  LIPASE 26   No results for input(s): AMMONIA in the last 168 hours. Coagulation Profile: No results for input(s): INR, PROTIME in the last 168 hours. Cardiac Enzymes: No results for input(s): CKTOTAL, CKMB, CKMBINDEX, TROPONINI in the last 168 hours. BNP (last 3 results) No results for input(s): PROBNP in the last 8760 hours. HbA1C: No results for input(s): HGBA1C in the last 72 hours. CBG: Recent Labs  Lab 11/21/19 2027 11/21/19 2345 11/22/19 0324 11/22/19 0805 11/22/19 1152  GLUCAP 107* 102* 106* 106* 154*   Lipid Profile: No results for input(s): CHOL, HDL, LDLCALC, TRIG, CHOLHDL, LDLDIRECT in the last 72 hours. Thyroid Function Tests: No results for input(s): TSH, T4TOTAL, FREET4, T3FREE, THYROIDAB in the last 72 hours. Anemia Panel: No results for input(s): VITAMINB12, FOLATE, FERRITIN, TIBC, IRON, RETICCTPCT in the last 72 hours. Sepsis Labs: No results for input(s): PROCALCITON, LATICACIDVEN in the last 168 hours.  Recent Results (from the past 240 hour(s))  SARS Coronavirus 2 by RT PCR (hospital order, performed in [facility] hospital lab) Nasopharyngeal Nasopharyngeal Swab     Status: None   Collection Time: 11/20/19  6:18 PM   Specimen: Nasopharyngeal Swab  Result Value Ref Range Status   SARS Coronavirus 2 NEGATIVE NEGATIVE Final    Comment: (NOTE) SARS-CoV-2 target nucleic acids are NOT DETECTED.  The SARS-CoV-2 RNA is generally detectable in upper and  lower respiratory specimens during the acute phase of infection. The lowest concentration of SARS-CoV-2 viral copies this assay can detect is 250 copies / mL. A negative result does not preclude SARS-CoV-2 infection and should not be used as the sole basis for treatment or other patient management decisions.  A negative result may occur with improper specimen collection / handling, submission of specimen other than nasopharyngeal swab, presence of viral mutation(s) within the areas targeted by this assay, and inadequate number of viral copies (<250 copies / mL). A negative result must be combined with clinical observations, patient history, and epidemiological information.  Fact Sheet for Patients:   BoilerBrush.com.cy  Fact Sheet for Healthcare Providers: https://pope.com/  This test is not yet approved or  cleared by the [place] FDA and has been authorized for detection and/or diagnosis of SARS-CoV-2 by FDA under an Emergency Use Authorization (EUA).  This EUA will remain in effect (meaning this test can be used) for the duration of the COVID-19 declaration under Section 564(b)(1) of the Act, 21 U.S.C. section 360bbb-3(b)(1), unless the authorization is terminated or revoked sooner.  Performed at [facility], [address], [place], [place] [address]   Urine culture     Status: Abnormal   Collection Time: 11/20/19  6:37 PM   Specimen: Urine, Random  Result Value Ref Range Status   Specimen Description URINE, RANDOM  Final   Special Requests NONE  Final   Culture (A)  Final    <10,000 COLONIES/mL INSIGNIFICANT GROWTH Performed at [facility], [address] [place], [place] [address]    Report Status 11/21/2019 FINAL  Final      Radiology Studies: CT Head Wo Contrast  Result Date: 11/20/2019 CLINICAL DATA:  Stroke suspected. Additional provided: Altered mental status. EXAM: CT HEAD WITHOUT CONTRAST  TECHNIQUE: Contiguous axial images were obtained from the base of the skull through the vertex  without intravenous contrast. COMPARISON:  Prior head CT examinations 11/18/2019 and earlier FINDINGS: Brain: Redemonstrated extensive chronic infarction changes within the left MCA and PCA vascular territories. Ex vacuo dilatation of the left lateral ventricle. Stable background mild-to-moderate generalized parenchymal atrophy and moderate cerebral white matter chronic small vessel ischemic disease. No acute intracranial hemorrhage or acute demarcated cortical infarction is identified. No extra-axial fluid collection. No evidence of intracranial mass. No midline shift. Vascular: No hyperdense vessel.  Atherosclerotic calcifications. Skull: Normal. Negative for fracture or focal lesion. Sinuses/Orbits: Visualized orbits show no acute finding. No significant paranasal sinus disease or mastoid effusion at the imaged levels. IMPRESSION: No CT evidence of acute intracranial abnormality. Redemonstrated extensive chronic infarction changes within the left MCA and PCA vascular territories. Stable background mild-to-moderate generalized parenchymal atrophy and moderate cerebral white matter chronic small vessel ischemic disease. Electronically Signed   By: Jackey Loge DO   On: 11/20/2019 15:48   DG Chest Portable 1 View  Result Date: 11/20/2019 CLINICAL DATA:  Altered mental status. Increased lethargy. Foul urine odor. EXAM: PORTABLE CHEST 1 VIEW COMPARISON:  11/18/2019 FINDINGS: The heart size and mediastinal contours are within normal limits. Loop recorder overlies the LEFT lung base. Both lungs are clear. The visualized skeletal structures are unremarkable. IMPRESSION: No active disease. Electronically Signed   By: Norva Pavlov M.D.   On: 11/20/2019 15:09   DG Swallowing Func-Speech Pathology  Result Date: 11/22/2019 Objective Swallowing Evaluation: Type of Study: MBS-Modified Barium Swallow Study  Patient Details  Name: Douglas [name]. MRN: [id number] Date of Birth: [birth year] Today's Date: 11/22/2019 Time: SLP Start Time (ACUTE ONLY): 1310 -SLP Stop Time (ACUTE ONLY): 1326 SLP Time Calculation (min) (ACUTE ONLY): 16 min Past Medical History: Past Medical History: Diagnosis Date . Acute blood loss anemia  . Acute embolic stroke (HCC)  . Acute lower UTI  . AMS (altered mental status) 06/05/2019 . Cerebral edema (HCC) 04/04/2018 . Diabetes 1.5, managed as type 2 (HCC)  . Diabetes mellitus without complication (HCC)  . History of CVA with residual deficit  . History of Dysphagia, post-stroke  . Hypertension  . Ischemic stroke (HCC) 03/05/2019 . Labile blood glucose  . 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 Renal insufficiency 04/04/2018 . Stroke (HCC)  . UTI (urinary tract infection) 03/05/2019 Past Surgical History: Past Surgical History: Procedure Laterality Date . IR PATIENT EVAL TECH 0-60 MINS  03/30/2018 . LOOP RECORDER INSERTION N/A 03/08/2019  Procedure: LOOP RECORDER INSERTION;  Surgeon: Regan Lemming, MD;  Location: [facility] INVASIVE CV LAB;  Service: Cardiovascular;  Laterality: N/A; . TEE WITHOUT CARDIOVERSION N/A 04/02/2018  Procedure: TRANSESOPHAGEAL ECHOCARDIOGRAM (TEE);  Surgeon: [name], MD;  Location: [facility] ENDOSCOPY;  Service: Cardiovascular;  Laterality: N/A; HPI: Pt is a 69 y.o. 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 CT of the head and CXR were negative for acute changes. EEG:  eileptogenicity in left frontal-anterior temporal region as well as  cortical dysfunction in left hemisphere likely secondary to underlying infarct and can also be seen in post-ictal state. Pt has a hx of oropharyngeal dysphagia with most recent recommendations for Dysphagia 3 (soft) solids and honey-thick liquids following MBS on 06/18/19.   BSE 07/16/19: prolonged mastication with ground solids and soft solids and suspect prolonged AP transport and delayed swallow initiation with all trials. An immediate cough was observed following  thin liquid trials.  A dysphagia 2 diet with honey thick liquids was recommended at that time.  No data recorded Assessment / Plan / Recommendation CHL IP CLINICAL IMPRESSIONS 11/22/2019 Clinical Impression Pt presents with likely acute on chronic oropharyngeal dysphagia characterized by prolonged mastication, impaired bolus cohesion, a pharyngeal delay, and reduced lingual retraction. He demonstrated lingual residue, premature spillage to the valleculae with solids and to the pyriform sinuses with liquids, and aspiration (PAS 7) with thin liquids via cup and with nectar thick liquids via straw. He was unable to demonstrate compensatory strategies despite cues due to his difficulty following commands. SLP will follow pt to ensure diet tolerance. Dysphagia treatment is recommended; however, the extent to which the pt will be able to participate is questioned due to aphasia.  SLP Visit Diagnosis Dysphagia, oropharyngeal phase (R13.12) Attention and concentration deficit following -- Frontal lobe and executive function deficit following -- Impact on safety and function Mild aspiration risk   CHL IP TREATMENT RECOMMENDATION 11/22/2019 Treatment Recommendations Therapy as outlined in treatment plan below   Prognosis 11/22/2019 Prognosis for Safe Diet Advancement Fair Barriers to Reach Goals Language deficits Barriers/Prognosis Comment -- CHL IP DIET RECOMMENDATION 11/22/2019 SLP Diet Recommendations Dysphagia 2 (Fine chop) solids;Nectar thick liquid Liquid Administration via Cup;No straw Medication Administration Whole meds with puree Compensations Small sips/bites;Slow rate Postural Changes Seated upright at 90 degrees   CHL IP OTHER RECOMMENDATIONS 11/22/2019 Recommended Consults -- Oral Care Recommendations Oral care BID Other  Recommendations Order thickener from pharmacy   CHL IP FOLLOW UP RECOMMENDATIONS 11/22/2019 Follow up Recommendations Outpatient SLP;Home health SLP   CHL IP FREQUENCY AND DURATION 11/22/2019 Speech Therapy Frequency (ACUTE ONLY) min 2x/week Treatment Duration 2 weeks      CHL IP ORAL PHASE 11/22/2019 Oral Phase Impaired Oral - Pudding Teaspoon -- Oral - Pudding Cup -- Oral - Honey Teaspoon -- Oral - Honey Cup -- Oral - Nectar Teaspoon -- Oral - Nectar Cup Decreased bolus cohesion;Premature spillage;Lingual/palatal residue Oral - Nectar Straw Decreased bolus cohesion;Premature spillage;Lingual/palatal residue Oral - Thin Teaspoon -- Oral - Thin Cup Decreased bolus cohesion;Premature spillage;Lingual/palatal residue Oral - Thin Straw -- Oral - Puree Decreased bolus cohesion;Premature spillage;Lingual/palatal residue Oral - Mech Soft Decreased bolus cohesion;Premature spillage;Lingual/palatal residue;Impaired mastication Oral - Regular Decreased bolus cohesion;Premature spillage;Lingual/palatal residue;Impaired mastication Oral - Multi-Consistency -- Oral - Pill -- Oral Phase - Comment --  CHL IP PHARYNGEAL PHASE 11/22/2019 Pharyngeal Phase Impaired Pharyngeal- Pudding Teaspoon -- Pharyngeal -- Pharyngeal- Pudding Cup -- Pharyngeal -- Pharyngeal- Honey Teaspoon -- Pharyngeal -- Pharyngeal- Honey Cup -- Pharyngeal -- Pharyngeal- Nectar Teaspoon -- Pharyngeal -- Pharyngeal- Nectar Cup Delayed swallow initiation-pyriform sinuses Pharyngeal -- Pharyngeal- Nectar Straw Delayed swallow initiation-pyriform sinuses;Penetration/Aspiration during swallow Pharyngeal Material enters airway, passes BELOW cords and not ejected out despite cough attempt by patient Pharyngeal- Thin Teaspoon -- Pharyngeal -- Pharyngeal- Thin Cup -- Pharyngeal -- Pharyngeal- Thin Straw -- Pharyngeal -- Pharyngeal- Puree Delayed swallow initiation-vallecula;Reduced tongue base retraction Pharyngeal -- Pharyngeal- Mechanical Soft Delayed swallow  initiation-vallecula;Delayed swallow initiation-pyriform sinuses;Reduced tongue base retraction Pharyngeal -- Pharyngeal- Regular Delayed swallow initiation-vallecula;Reduced tongue base retraction Pharyngeal -- Pharyngeal- Multi-consistency -- Pharyngeal -- Pharyngeal- Pill Delayed swallow initiation-vallecula;Reduced tongue base retraction Pharyngeal -- Pharyngeal Comment --  CHL IP CERVICAL ESOPHAGEAL PHASE 11/22/2019 Cervical Esophageal Phase WFL Pudding Teaspoon -- Pudding Cup -- Honey Teaspoon -- Honey Cup -- Nectar Teaspoon -- Nectar Cup -- Nectar Straw -- Thin Teaspoon -- Thin Cup -- Thin Straw -- Puree -- Mechanical Soft -- Regular --  Multi-consistency -- Pill -- Cervical Esophageal Comment -- Yvone Neu I. Vear Clock, MS, CCC-SLP Acute Rehabilitation Services Office number [phone] Pager [phone] Scheryl Marten 11/22/2019, 2:00 PM              EEG LTVM - Continuous Bedside W/ Video Includes Portable EEG Read  Result Date: 11/20/2019 Charlsie Quest, MD     11/21/2019  7:40 AM Patient Name: Douglas [name]. MRN: [id number] Epilepsy Attending: Charlsie Quest Referring Physician/Provider: Dr Velta Addison Date: 11/20/2019 Duration: 21.15mins Patient history: 69 y.o. 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 Neuro exam with R plegia, not following commands. EEG to evaluate for seizure. Level of alertness: Awake AEDs during EEG study: LTG, Vimpat Technical aspects: This EEG study was done with scalp electrodes positioned according to the 10-20 International system of electrode placement. Electrical activity was acquired at a sampling rate of 500Hz  and reviewed with a high frequency filter of 70Hz  and a low frequency filter of 1Hz . EEG data were recorded continuously and digitally stored. Description: The posterior  dominant rhythm consists of 8-9 Hz activity of moderate voltage (25-35 uV) seen predominantly in posterior head regions, asymmetric ( L<R) and reactive to eye opening and eye closing. EEG showed continuous left hemispheric 3 to 6 Hz theta-delta slowing. Hyperventilation and photic stimulation were not performed.   ABNORMALITY -Continuous slow, left hemisphere - Background asymmetry, left<right IMPRESSION: This study is suggestive of cortical dysfunction in left hemisphere likely secondary to underlying infarct and can also be seen in post-ictal state. No seizures or epileptiform discharges were seen throughout the recording.   CUP PACEART REMOTE DEVICE CHECK  Result Date: 11/21/2019 Carelink summary report received. Battery status OK. Normal device function. No new symptom episodes, tachy episodes, brady, or pause episodes. No new AF episodes. Monthly summary reports and ROV/PRN , RN, CCDS, CV [employer]   Scheduled Meds: . aspirin  300 mg Rectal Daily  . atorvastatin  40 mg Oral QHS  . baclofen  20 mg Oral BID  . carvedilol  12.5 mg Oral BID WC  . clopidogrel  75 mg Oral Daily  . insulin aspart  0-9 Units Subcutaneous Q4H  . insulin detemir  16 Units Subcutaneous Daily  . lacosamide  100 mg Oral BID  . lamoTRIgine  150 mg Oral BID  . tamsulosin  0.4 mg Oral QPC supper   Continuous Infusions: . sodium chloride 75 mL/hr at 11/21/19 1614     LOS: 2 days   Time spent: 37 minutes  Charlsie Quest, MD [facility]  11/22/2019, 2:19 PM   To contact the attending provider between 7A-7P or the covering provider during after hours 7P-7A, please log into the web site [email or web address].

## 2019-11-22 NOTE — Progress Notes (Signed)
vLTM EEG complete. No skin breakdown 

## 2019-11-22 NOTE — Evaluation (Signed)
Clinical/Bedside Swallow Evaluation Patient Details  Name: Douglas [name]. MRN: [id number] Date of Birth: 1950-07-09  Today's Date: 11/22/2019 Time: SLP Start Time (ACUTE ONLY): 1028 SLP Stop Time (ACUTE ONLY): 1100 SLP Time Calculation (min) (ACUTE ONLY): 32 min  Past Medical History:  Past Medical History:  Diagnosis Date  . Acute blood loss anemia   . Acute embolic stroke (HCC)   . Acute lower UTI   . AMS (altered mental status) 06/05/2019  . Cerebral edema (HCC) 04/04/2018  . Diabetes 1.5, managed as type 2 (HCC)   . Diabetes mellitus without complication (HCC)   . History of CVA with residual deficit   . History of Dysphagia, post-stroke   . Hypertension   . Ischemic stroke (HCC) 03/05/2019  . Labile blood glucose   . 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 Renal insufficiency 04/04/2018  . Stroke (HCC)   . UTI (urinary tract infection) 03/05/2019   Past Surgical History:  Past Surgical History:  Procedure Laterality Date  . IR PATIENT EVAL TECH 0-60 MINS  03/30/2018  . LOOP RECORDER INSERTION N/A 03/08/2019   Procedure: LOOP RECORDER INSERTION;  Surgeon: Regan Lemming, MD;  Location: [facility] INVASIVE CV LAB;  Service: Cardiovascular;  Laterality: N/A;  . TEE WITHOUT CARDIOVERSION N/A 04/02/2018   Procedure: TRANSESOPHAGEAL ECHOCARDIOGRAM (TEE);  Surgeon: [name], MD;  Location: [facility] ENDOSCOPY;  Service: Cardiovascular;  Laterality: N/A;   HPI:  Pt is a 69 y.o. 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 CT of the head and CXR were negative for acute changes. EEG:  eileptogenicity in left frontal-anterior temporal region as well as  cortical dysfunction in left hemisphere likely secondary to underlying infarct and can also be seen in post-ictal state. Pt has a hx of oropharyngeal  dysphagia with most recent recommendations for Dysphagia 3 (soft) solids and honey-thick liquids following MBS on 06/18/19.  BSE 07/16/19: prolonged mastication with ground solids and soft solids and suspect prolonged AP transport and delayed swallow initiation with all trials. An immediate cough was observed following thin liquid trials.  A dysphagia 2 diet with honey thick liquids was recommended at that time.    Assessment / Plan / Recommendation Clinical Impression  Pt was seen for bedside swallow evaluation. Pt has a history of aphasia and verbal output was limited to yes/yeah with reduced vocal intensity. [name] was contacted via phone and indicated that the pt has been consuming a soft diet with thin liquids since she was unaware of the recommendation for honey thick liquids when he was discharged in March. She denied any signs of aspiration with p.o. intake unless pt is eating/drinking quickly. Oral mechanism exam was limited due to pt's difficulty following commands; however, he presented with right-sided facial weakness and adequate dentition. He demonstrated symptoms of oropharyngeal dysphagia characterized by prolonged mastication of regular texture solids, lingual residue, and consistent signs of aspiration with thin liquids via cup. A modified barium swallow study is recommended to further assess swallow function and it is currently scheduled for today at 12:30.  SLP Visit Diagnosis: Dysphagia, oropharyngeal phase (R13.12)    Aspiration Risk  Mild aspiration risk    Diet Recommendation  (Defer until MBS completed)   Supervision: Patient able to self feed    Other  Recommendations Oral Care Recommendations: Oral care BID   Follow  up Recommendations Other (comment) (TBD)      Frequency and Duration min 2x/week  2 weeks       Prognosis Barriers to Reach Goals: Language deficits      Swallow Study   General Date of Onset: 11/21/19 HPI: Pt is a 69 y.o. male with history of  right-sided hemiplegia and aphasia from previous stroke, hypertension, diabetes mellitus type 2 was noticed to have staring spells as per the patient's daughter, "not making sense", and hallucinating. CT of the head and CXR were negative for acute changes. EEG:  eileptogenicity in left frontal-anterior temporal region as well as  cortical dysfunction in left hemisphere likely secondary to underlying infarct and can also be seen in post-ictal state. Pt has a hx of oropharyngeal dysphagia with most recent recommendations for Dysphagia 3 (soft) solids and honey-thick liquids following MBS on 06/18/19.  BSE 07/16/19: prolonged mastication with ground solids and soft solids and suspect prolonged AP transport and delayed swallow initiation with all trials. An immediate cough was observed following thin liquid trials.  A dysphagia 2 diet with honey thick liquids was recommended at that time.  Type of Study: Bedside Swallow Evaluation Previous Swallow Assessment: See HPI Diet Prior to this Study: NPO Temperature Spikes Noted: No Respiratory Status: Room air History of Recent Intubation: No Behavior/Cognition: Alert;Cooperative;Pleasant mood Oral Cavity Assessment: Within Functional Limits Oral Care Completed by SLP: No Oral Cavity - Dentition: Adequate natural dentition Vision: Functional for self-feeding Self-Feeding Abilities: Needs assist Patient Positioning: Upright in bed;Postural control adequate for testing Baseline Vocal Quality: Normal;Not observed    Oral/Motor/Sensory Function Overall Oral Motor/Sensory Function: Mild impairment Facial ROM: Reduced right Facial Symmetry: Abnormal symmetry right;Suspected CN VII (facial) dysfunction Facial Strength: Reduced right;Suspected CN VII (facial) dysfunction Facial Sensation: Reduced right;Suspected CN V (Trigeminal) dysfunction   Ice Chips Ice chips: Within functional limits Presentation: Spoon   Thin Liquid Thin Liquid: Impaired Pharyngeal  Phase  Impairments: Cough - Immediate    Nectar Thick Nectar Thick Liquid: Within functional limits Presentation: Cup;Straw   Honey Thick Honey Thick Liquid: Within functional limits Presentation: Cup;Straw   Puree Puree: Within functional limits Presentation: Spoon   Solid     Solid: Impaired Presentation: Self Fed Oral Phase Impairments: Impaired mastication (with regular texture)     Andjela Wickes I. Vear Clock, MS, CCC-SLP Acute Rehabilitation Services Office number [phone] Pager [phone]  Scheryl Marten 11/22/2019,11:50 AM

## 2019-11-22 NOTE — Progress Notes (Signed)
Modified Barium Swallow Progress Note  Patient Details  Name: Douglas [name]. MRN: [id number] Date of Birth: [birth year]  Today's Date: 11/22/2019  Modified Barium Swallow completed.  Full report located under Chart Review in the Imaging Section.  Brief recommendations include the following:  Clinical Impression  Pt presents with likely acute on chronic oropharyngeal dysphagia characterized by prolonged mastication, impaired bolus cohesion, a pharyngeal delay, and reduced lingual retraction. He demonstrated lingual residue, premature spillage to the valleculae with solids and to the pyriform sinuses with liquids, and aspiration (PAS 7) with thin liquids via cup and with nectar thick liquids via straw. He was unable to demonstrate compensatory strategies despite cues due to his difficulty following commands. SLP will follow pt to ensure diet tolerance. Dysphagia treatment is recommended; however, the extent to which the pt will be able to participate is questioned due to aphasia.    Swallow Evaluation Recommendations        SLP Diet Recommendations: Dysphagia 2 (Fine chop) solids;Nectar thick liquid   Liquid Administration via: Cup;No straw   Medication Administration: Whole meds with puree   Supervision: Staff to assist with self feeding;Full supervision/cueing for compensatory strategies   Compensations: Small sips/bites;Slow rate   Postural Changes: Seated upright at 90 degrees          Douglas [name] I. Vear Clock, MS, CCC-SLP Acute Rehabilitation Services Office number [phone] Pager [phone]   Douglas [name] 11/22/2019,1:57 PM

## 2019-11-22 NOTE — Progress Notes (Signed)
Carelink Summary Report / Loop Recorder 

## 2019-11-22 NOTE — Procedures (Signed)
Patient Name:Douglas [name]. EGB:[id number] Epilepsy Attending:Avamae Dehaan Annabelle Harman Referring Physician/Provider:Dr Velta Addison Duration:11/21/2019 1753 to 11/22/2019 0804  Patient history:69 y.o.malewith PMH significant for prior L MCA and PCA stroke with residual aphasia and R hemiplegia, hx of seizures on lamictal 125mg  BID who presents with a prolonged episode of starring off and lethargic over the last 2 days.Neuro exam with R plegia, not following commands.EEG to evaluate for seizure.  Level of alertness:Awake, asleep  AEDs during EEG study:LTG, Vimpat  Technical aspects: This EEG study was done with scalp electrodes positioned according to the 10-20 International system of electrode placement. Electrical activity was acquired at a sampling rate of 500Hz  and reviewed with a high frequency filter of 70Hz  and a low frequency filter of 1Hz . EEG data were recorded continuously and digitally stored.   Description: The posterior dominant rhythm consists of8-9Hz  activity of moderate voltage (25-35 uV) seen predominantly in posterior head regions, asymmetric( L<R)and reactive to eye opening and eye closing. Sleep was characterized by vertex waves, sleep spindles 912-14hz ), maximal frontocentral region. EEG showed continuous left hemispheric3 to 6 Hz theta-delta slowing as well as spikes in left frontal-anterior temporal region, maximal F7 Hyperventilation and photic stimulation were not performed.   ABNORMALITY - Spike, left frontal-anterior temporal region, maximal F7 -Continuousslow, left hemisphere - Background asymmetry, left<right  IMPRESSION: This study is suggestive of eileptogenicity in left frontal-anterior temporal region as well as  cortical dysfunction in left hemisphere likely secondary to underlying infarct and can also be seen in post-ictal state.No seizures were seen throughout the recording.  [name] 

## 2019-11-22 NOTE — Progress Notes (Signed)
LTM EEG discontinued - no skin breakdown at unhook.   

## 2019-11-22 NOTE — Progress Notes (Signed)
NEUROLOGY PROGRESS NOTE  Subjective: Patient is awake, acknowledges my presence, nods his head when I speak but unable to follow commands.  Exam: Vitals:   11/22/19 1151 11/22/19 1553  BP: (!) 158/78 (!) 138/73  Pulse: 82 68  Resp: 17 17  Temp: 98.9 F (37.2 C) 98.3 F (36.8 C)  SpO2: 96% 100%     Physical Exam  Constitutional: Appears well-developed and well-nourished.  Psych: Affect appropriate to situation Eyes: No scleral injection HENT: No OP obstrucion Head: Normocephalic.  Cardiovascular: Normal rate and regular rhythm.  Respiratory: Effort normal, non-labored breathing GI: Soft.  No distension. There is no tenderness.  Skin: WDI   Neuro:  Mental Status: Alert, oriented, thought content appropriate.  Speech fluent without evidence of aphasia.  Able to follow 3 step commands without difficulty. Cranial Nerves: Pupils equal round reactive bilaterally Looks to the left and right Right facial droop Does not move right arm or leg.  Noted to have contractures in the right arm Spontaneously moves left arm and leg antigravity. Reflexes: Hyperreflexic on the right with 2+ reflexes on the left.  Babinski is mute bilaterally  Medications:  Scheduled: . amLODipine  10 mg Oral Daily  . aspirin EC  325 mg Oral q morning - 10a  . aspirin  300 mg Rectal Daily  . atorvastatin  40 mg Oral QHS  . baclofen  20 mg Oral BID  . carvedilol  12.5 mg Oral BID WC  . clopidogrel  75 mg Oral Daily  . insulin aspart  0-9 Units Subcutaneous Q4H  . insulin detemir  16 Units Subcutaneous Daily  . lacosamide  100 mg Oral BID  . lamoTRIgine  150 mg Oral BID  . lisinopril  20 mg Oral 2 times per day  . tamsulosin  0.4 mg Oral QPC supper   Continuous: . sodium chloride 75 mL/hr at 11/21/19 1614   CVE:[id number], ondansetron **OR** ondansetron (ZOFRAN) IV, Resource ThickenUp Clear  Pertinent Labs/Diagnostics:               EEG LTVM - Continuous Bedside W/ Video Includes Portable  EEG Read  Result Date: 11/20/2019 Douglas Quest, MD     11/21/2019  7:40 AM Patient Name: Douglas [name]. MRN: [id number] Epilepsy Attending: Charlsie [name] Referring Physician/Provider: Dr Velta Addison Date: 11/20/2019 Duration: 21.43mins Patient history: 69 y.o. 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 Neuro exam with R plegia, not following commands. EEG to evaluate for seizure. Level of alertness: Awake AEDs during EEG study: LTG, Vimpat Technical aspects: This EEG study was done with scalp electrodes positioned according to the 10-20 International system of electrode placement. Electrical activity was acquired at a sampling rate of 500Hz  and reviewed with a high frequency filter of 70Hz  and a low frequency filter of 1Hz . EEG data were recorded continuously and digitally stored. Description: The posterior dominant rhythm consists of 8-9 Hz activity of moderate voltage (25-35 uV) seen predominantly in posterior head regions, asymmetric ( L<R) and reactive to eye opening and eye closing. EEG showed continuous left hemispheric 3 to 6 Hz theta-delta slowing. Hyperventilation and photic stimulation were not performed.   ABNORMALITY -Continuous slow, left hemisphere - Background asymmetry, left<right IMPRESSION: This study is suggestive of cortical dysfunction in left hemisphere likely secondary to underlying infarct and  can also be seen in post-ictal state. No seizures or epileptiform discharges were seen throughout the recording. Douglas Lahoma Rocker PA-C [facility] [phone]  Impression and plan: Douglas Petersis a 69 y.o.malewith PMH significant for prior L MCA and PCA stroke with residual aphasia and R hemiplegia, hx of seizures on lamictal 125mg  BID who presents with a  30 min episode of starring off and lethargy.Neuro exam with significantly improved and continues to be the same today with wakefulness and alertness.  cEEG with no further seizures. No clear infectious or metabolic abnormality that could have triggered his event.  Recs: Restart Lamictal 150 mg p.o. twice daily and continue Vimpat for 2 overlapping doses p.o.  Will need neurology follow-up as outpatient.  Neurology will sign off at this time.    11/22/2019, 4:14 PM

## 2019-11-23 ENCOUNTER — Inpatient Hospital Stay (HOSPITAL_COMMUNITY): Payer: Medicare Other

## 2019-11-23 LAB — GLUCOSE, CAPILLARY
Glucose-Capillary: 97 mg/dL (ref 70–99)
Glucose-Capillary: 148 mg/dL — ABNORMAL HIGH (ref 70–99)
Glucose-Capillary: 166 mg/dL — ABNORMAL HIGH (ref 70–99)
Glucose-Capillary: 232 mg/dL — ABNORMAL HIGH (ref 70–99)
Glucose-Capillary: 242 mg/dL — ABNORMAL HIGH (ref 70–99)
Glucose-Capillary: 241 mg/dL — ABNORMAL HIGH (ref 70–99)

## 2019-11-23 LAB — CBC WITH DIFFERENTIAL/PLATELET
Abs Immature Granulocytes: 0.02 10*3/uL (ref 0.00–0.07)
Basophils Absolute: 0 10*3/uL (ref 0.0–0.1)
Basophils Relative: 1 %
Eosinophils Absolute: 0.2 10*3/uL (ref 0.0–0.5)
Eosinophils Relative: 4 %
HCT: 40.9 % (ref 39.0–52.0)
Hemoglobin: 13.1 g/dL (ref 13.0–17.0)
Immature Granulocytes: 0 %
Lymphocytes Relative: 24 %
Lymphs Abs: 1.4 10*3/uL (ref 0.7–4.0)
MCH: 30.4 pg (ref 26.0–34.0)
MCHC: 32 g/dL (ref 30.0–36.0)
MCV: 94.9 fL (ref 80.0–100.0)
Monocytes Absolute: 0.5 10*3/uL (ref 0.1–1.0)
Monocytes Relative: 9 %
Neutro Abs: 3.6 10*3/uL (ref 1.7–7.7)
Neutrophils Relative %: 62 %
Platelets: 193 10*3/uL (ref 150–400)
RBC: 4.31 MIL/uL (ref 4.22–5.81)
RDW: 13.2 % (ref 11.5–15.5)
WBC: 5.8 10*3/uL (ref 4.0–10.5)
nRBC: 0 % (ref 0.0–0.2)

## 2019-11-23 LAB — BASIC METABOLIC PANEL
Anion gap: 9 (ref 5–15)
BUN: 10 mg/dL (ref 8–23)
CO2: 22 mmol/L (ref 22–32)
Calcium: 9.2 mg/dL (ref 8.9–10.3)
Chloride: 112 mmol/L — ABNORMAL HIGH (ref 98–111)
Creatinine, Ser: 0.9 mg/dL (ref 0.61–1.24)
GFR calc Af Amer: >60 mL/min (ref >60)
GFR calc non Af Amer: >60 mL/min (ref >60)
Glucose, Bld: 90 mg/dL (ref 70–99)
Potassium: 4.6 mmol/L (ref 3.5–5.1)
Sodium: 143 mmol/L (ref 135–145)

## 2019-11-23 MED ORDER — WHITE PETROLATUM EX OINT
TOPICAL_OINTMENT | CUTANEOUS | Status: AC
  Administered 2019-11-23: 0.2
  Filled 2019-11-23: qty 28.35

## 2019-11-23 NOTE — Progress Notes (Signed)
Occupational Therapy Treatment Patient Details Name: Douglas [name]. MRN: [id number] DOB: [birth year] Today's Date: 11/23/2019    History of present illness (P) Kalyn Dimattia is a 69 yo male presenting with AMS and concern for seizures. Upon workup, CT was unremarkable but EEG was suggestive of eileptogenicity in left frontal-anterior temporal region as well as cortical dysfunction in left hemisphere likely secondary to underlying infarct. PMH includes: CVA with R hemiplegia, HTN, DM II, HLD, CHD IIIa, and CHF.   OT comments  Passive stretch to Rt hand and applied resting hand splint.  Pt tolerated it well for 4 hours with no signs of pressure.  Wear schedule posted above bed and nsg notified.   Follow Up Recommendations  (P) SNF    Equipment Recommendations  (P) None recommended by OT    Recommendations for Other Services      Precautions / Restrictions Precautions Precautions: (P) Fall Precaution Comments: (P) residual R hemiplegia, dysphasia       Mobility Bed Mobility                  Transfers                      Balance                                           ADL either performed or assessed with clinical judgement   ADL                                               Vision       Perception     Praxis      Cognition Arousal/Alertness: (P) Lethargic Behavior During Therapy: (P) Flat affect Overall Cognitive Status: (P) No family/caregiver present to determine baseline cognitive functioning                                 General Comments: (P) Pt sleeping         Exercises Exercises: (P) Other exercises Other Exercises Other Exercises: (P) Passive ROM/stretch to fingers and wrist performed then resting hand splint was applied.  Pt tolerated well.  Returned ~ 4 hours later to check splint.  Hand was in good alignment.  Splint removed and skin inspected with no sign of pressure.    Splint schedule established and posted in room.  RN notifed.    Shoulder Instructions       General Comments      Pertinent Vitals/ Pain       Pain Assessment: (P) Faces Faces Pain Scale: (P) No hurt  Home Living                                          Prior Functioning/Environment              Frequency  (P) Min 2X/week        Progress Toward Goals  OT Goals(current goals can now be found in the care plan section)  Progress towards OT goals: (P) Progressing toward goals  Plan (P) Discharge plan remains appropriate    Co-evaluation                 AM-PAC OT "6 Clicks" Daily Activity     Outcome Measure   Help from another person eating meals?: (P) A Lot Help from another person taking care of personal grooming?: (P) A Lot Help from another person toileting, which includes using toliet, bedpan, or urinal?: (P) A Lot Help from another person bathing (including washing, rinsing, drying)?: (P) Total Help from another person to put on and taking off regular upper body clothing?: (P) Total Help from another person to put on and taking off regular lower body clothing?: (P) Total 6 Click Score: (P) 9    End of Session    OT Visit Diagnosis: (P) Unsteadiness on feet (R26.81);Hemiplegia and hemiparesis;Cognitive communication deficit (R41.841) Symptoms and signs involving cognitive functions: (P) Cerebral infarction Hemiplegia - Right/Left: (P) Right Hemiplegia - caused by: (P) Cerebral infarction   Activity Tolerance     Patient Left     Nurse Communication          Time: (P) 1553-(P) 1604 OT Time Calculation (min): (P) 11 min  Charges: OT General Charges $OT Visit: (P) 1 Visit OT Treatments $Orthotics Fit/Training: (P) 8-22 mins  Eber Jones., OTR/L Acute Rehabilitation Services Pager [phone] Office [phone]    Jeani Hawking M 11/23/2019, 4:59 PM

## 2019-11-23 NOTE — Evaluation (Signed)
Occupational Therapy Evaluation Patient Details Name: Douglas [name]. MRN: [id number] DOB: 22-May-1950 Today's Date: 11/23/2019    History of Present Illness Douglas [name] is a 69 yo male presenting with AMS and concern for seizures. Upon workup, CT was unremarkable but EEG was suggestive of eileptogenicity in left frontal-anterior temporal region as well as cortical dysfunction in left hemisphere likely secondary to underlying infarct. PMH includes: CVA with R hemiplegia, HTN, DM II, HLD, CHD IIIa, and CHF.   Clinical Impression   Pt admitted with above. He demonstrates the below listed deficits and will benefit from continued OT to maximize safety and independence with BADLs.  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  He currently requires mod - total A for ADLs and requires max A +2 for functional transfers.  Pt unable to provide info re: PLOF due to communication deficits.  Attempted unsuccessfully to contact daughter.  Per chart, he lived with daughter, who provided assist.  Due to him requiring +2 assist, recommend SNF level rehab. He does have a splint in his room, however, I had difficulty donning it.  I was able to extend fingers and wrist to neutral, but did not reattempt due to pt going for CT.        Follow Up Recommendations  SNF    Equipment Recommendations  None recommended by OT    Recommendations for Other Services       Precautions / Restrictions Precautions Precautions: Fall Precaution Comments: residual R hemiplegia, dysphasia Restrictions Weight Bearing Restrictions: No      Mobility Bed Mobility Overal bed mobility: Needs Assistance Bed Mobility: Rolling;Supine to Sit;Sit to Supine Rolling: Mod assist   Supine to sit: Max assist Sit to supine: Total assist   General bed mobility comments: Pt able to assist very minimally.  He demonstrates significant processing  and initiation delay   Transfers Overall transfer level: Needs assistance Equipment used: 2 person hand held assist Transfers: Sit to/from Stand Sit to Stand: Max assist;+2 physical assistance;+2 safety/equipment        Lateral/Scoot Transfers: Max assist General transfer comment: pt requires assist for all aspects.  He is unable to fully extend hips and knees     Balance Overall balance assessment: Needs assistance Sitting-balance support: Single extremity supported;Feet supported Sitting balance-Leahy Scale: Poor Sitting balance - Comments: pt requires min A - mod A for EOB sitting.  He demonstrates Lt and posterior bias  Postural control: Posterior lean;Left lateral lean Standing balance support:  (pt unable to attempt)                               ADL either performed or assessed with clinical judgement   ADL Overall ADL's : Needs assistance/impaired Eating/Feeding: Minimal assistance;Bed level   Grooming: Wash/dry hands;Wash/dry face;Brushing hair;Maximal assistance;Sitting   Upper Body Bathing: Total assistance;Sitting   Lower Body Bathing: Total assistance;Sit to/from stand;Bed level   Upper Body Dressing : Total assistance;Sitting   Lower Body Dressing: Total assistance;Sit to/from stand   Toilet Transfer: Maximal assistance;+2 for physical assistance;+2 for safety/equipment;Squat-pivot;BSC   Toileting- Clothing Manipulation and Hygiene: Total assistance;Bed level       Functional mobility during ADLs: +2 for physical assistance;+2 for safety/equipment;Maximal assistance       Vision   Additional Comments: unable to accurately assess vision due to communication deficits.  He does scan Lt and Rt  to locate therapists      Perception     Praxis Praxis Praxis tested?: Deficits Deficits: Initiation;Ideomotor;Perseveration    Pertinent Vitals/Pain Pain Assessment: Faces Faces Pain Scale: No hurt     Hand Dominance Left (per chart review  )   Extremity/Trunk Assessment Upper Extremity Assessment Upper Extremity Assessment: RUE deficits/detail RUE Deficits / Details: Pt with residual Rt hempligia with severe flexor spasticity.  He demonstrate elbow flexion contracture.  With tone inhibition techniques able to achieve composite finger/wrist extension to wrist neutral  RUE Coordination: decreased gross motor;decreased fine motor   Lower Extremity Assessment Lower Extremity Assessment: Defer to PT evaluation RLE Deficits / Details: residual R-sided spastic hemiplegia, pt with poor functional control or use   Cervical / Trunk Assessment Cervical / Trunk Assessment: Other exceptions Cervical / Trunk Exceptions: pt maintains flexed posture, Lt lateral flexion with Rt elongation of trunk.  He demonstrates ability to activate Rt trunk minimally with facilitation    Communication Communication Communication: Expressive difficulties;Receptive difficulties   Cognition Arousal/Alertness: Awake/alert Behavior During Therapy: Flat affect Overall Cognitive Status: No family/caregiver present to determine baseline cognitive functioning                                 General Comments: Cognition very difficult to accurately assess due to severity of communication deficits and unknown PLOF.  He will intermittently follow one step commands with maximal multimodal cuing, and demonstrates a significant processing delay    General Comments  Pt supine in bed attempting to drink liquids from his tray upon PT arrival, per SLP recommendations the pt should be upright and have full assist to eat/drink. The pt remained restless and impulsive through this morning's session, stating "yeah" consistently through the session irresponsive to questions/commands.    Exercises     Shoulder Instructions      Home Living Family/patient expects to be discharged to:: Skilled nursing facility                                  Additional Comments: Per chart, pt was living with his daughter at home who assisted with ADLs and tansfers       Prior Functioning/Environment Level of Independence: Needs assistance  Gait / Transfers Assistance Needed: requires minA assist to transfer to [facility].     Comments: Pt unable to provide info re: PLOF due to communication deficits.  Attempted to contact daughter without success.  Per chart, he was able to transfer to w/c with +1 assist, and required assist for ADLs         OT Problem List: Decreased strength;Decreased range of motion;Decreased activity tolerance;Impaired balance (sitting and/or standing);Impaired vision/perception;Decreased coordination;Decreased cognition;Decreased safety awareness;Decreased knowledge of use of DME or AE;Impaired sensation;Impaired tone;Impaired UE functional use      OT Treatment/Interventions: Self-care/ADL training;Neuromuscular education;DME and/or AE instruction;Therapeutic activities;Splinting;Cognitive remediation/compensation;Visual/perceptual remediation/compensation;Patient/family education;Balance training    OT Goals(Current goals can be found in the care plan section) Acute Rehab OT Goals OT Goal Formulation: Patient unable to participate in goal setting Time For Goal Achievement: 12/07/19 Potential to Achieve Goals: Fair ADL Goals Pt Will Perform Eating: with supervision;with set-up;sitting Pt Will Perform Grooming: with mod assist;sitting Pt Will Transfer to Toilet: with max assist;squat pivot transfer;bedside commode  OT Frequency: Min 2X/week   Barriers to D/C: Decreased caregiver support  unsure daughter can provide necessary level  of assist        Co-evaluation PT/OT/SLP Co-Evaluation/Treatment: Yes Reason for Co-Treatment: Complexity of the patient's impairments (multi-system involvement);Necessary to address cognition/behavior during functional activity;For patient/therapist safety   OT goals addressed during  session: ADL's and self-care;Strengthening/ROM      AM-PAC OT "6 Clicks" Daily Activity     Outcome Measure Help from another person eating meals?: A Lot Help from another person taking care of personal grooming?: A Lot Help from another person toileting, which includes using toliet, bedpan, or urinal?: A Lot Help from another person bathing (including washing, rinsing, drying)?: Total Help from another person to put on and taking off regular upper body clothing?: Total Help from another person to put on and taking off regular lower body clothing?: Total 6 Click Score: 9   End of Session Equipment Utilized During Treatment: Gait belt Nurse Communication: Mobility status  Activity Tolerance: Patient tolerated treatment well Patient left: in bed;with call bell/phone within reach;with bed alarm set  OT Visit Diagnosis: Unsteadiness on feet (R26.81);Hemiplegia and hemiparesis;Cognitive communication deficit (R41.841) Symptoms and signs involving cognitive functions: Cerebral infarction Hemiplegia - Right/Left: Right Hemiplegia - dominant/non-dominant:  (unsure ) Hemiplegia - caused by: Cerebral infarction                Time: 6578-4696 OT Time Calculation (min): 32 min Charges:  OT General Charges $OT Visit: 1 Visit OT Evaluation $OT Eval Moderate Complexity: 1 Mod  Husain Costabile C., OTR/L Acute Rehabilitation Services Pager [phone] Office [phone]   Jeani Hawking M 11/23/2019, 11:58 AM

## 2019-11-23 NOTE — Progress Notes (Addendum)
PROGRESS NOTE    Douglas [name].  WUJ:[id number] DOB: [birth year] DOA: 11/20/2019 PCP: Douglas Ok, MD   Brief Narrative:  [name]. is a 69 y.o. male with history of right-sided hemiplegia from previous stroke, hypertension, diabetes mellitus type 2 was noticed to have staring spells as per the patient's daughter.  The first episode was around 48 hours ago in the evening which lasted for few seconds and then got corrected.  The next one was noticed the morning of presentation when patient woke up.  He was then brought to the ER at that point.  In the ER CT head was unremarkable.  There was some concern for status epilepticus and neurologist on-call was consulted patient was given increased dose of Lamictal and 1 dose of Vimpat was given.    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  Patient was loaded with Vimpat 200 mg IV and currently on 100 mg IV twice daily. cEEG shows epileptiform activity in left frontal anterior temporal region as well as cortical dysfunction in the left hemisphere.  Patient was on IV Vimpat since he was failing swallow evaluation.  Now status post modified barium swallow and per speech therapist/he can have a dysphagia 2 diet.  He can take all p.o. meds.  I had a long discussion with Dr. Derry [name] of neurology who recommends increasing Lamictal to 150 mg twice daily and continuing Vimpat 100 mg p.o. twice daily until 11/23/2019.  I see that he had not received his Vimpat dose this morning as patient had refused.  I asked nurse and reiterated how important it is for the patient to receive that medication and asked her to try  again.  Essential hypertension: Blood pressure controlled.  According to chart, he had refused his medications this morning.  I have asked nurse to try again.  Dysphagia: Now cleared to have dysphagia 2 diet by SLP.  Hyperlipidemia: Continue atorvastatin   COPD: Stable.  Continue home inhalers.  Type 2 diabetes mellitus: Takes glipizide and 16 units of Lantus and sliding scale as well at home.  Blood sugar labile.  Continue current dose of Lantus which is 16 units and SSI.  History of stroke with right-sided hemiplegia: Stable.  Chronic kidney disease stage IIIa: At baseline.  Chronic diastolic congestive heart failure: Appears euvolemic.  Echo done in November 2020 shows grade 1 DD, will continue gentle hydration as I am unsure how much p.o. he is going to take.  Right shoulder dislocation: On his daughter's concern, x-ray of the right shoulder was obtained which shows possible dislocation.  Reviewed chart in the past and it looks like patient has recurrent dislocation of the right shoulder.  Please note that patient is hemiplegic on the right side.  On examination, patient is nontender.  He is not complaining of any pain.  He looks comfortable.  I discussed with daughter about the fact that this probably does not need any fixation as this is the shoulder that he does not use however daughter requests orthopedic consultation.  I consulted Dr. Carola [name] who has obtained CT scan.  Will defer to orthopedics for further management.  DVT prophylaxis:  Code Status: Full Code  Family Communication: None present at bedside.  Called and spoke to his daughter [name] once again today about PTs recommendation to discharge SNF.  She is in agreement.  Sent a message to [name] to touch base with family about finding facility.  Status is: Inpatient  Remains inpatient appropriate because: Unsafe DC plan   Dispo: The patient is from: Home              Anticipated d/c is to: SNF               Anticipated d/c date is: 11/25/2019              Patient currently is medically stable to d/c.    Estimated body mass index is 23.32 kg/m as calculated from the following:   Height as of this encounter: 6' (1.829 m).   Weight as of this encounter: 78 kg.  Pressure Injury 06/08/19 Buttocks Right;Left;Upper Stage 2 -  Partial thickness loss of dermis presenting as a shallow open injury with a red, pink wound bed without slough. (Active)  06/08/19 0645  Location: Buttocks  Location Orientation: Right;Left;Upper  Staging: Stage 2 -  Partial thickness loss of dermis presenting as a shallow open injury with a red, pink wound bed without slough.  Wound Description (Comments):   Present on Admission:      Nutritional status:               Consultants:   Neurology  Procedures:   None  Antimicrobials:  Anti-infectives (From admission, onward)   None         Subjective: Seen and examined.  Patient sitting at the bed and fed by the nurse.  Had no complaint.  More talkative today but mostly says single words such as " this".  Completely alert.  Objective: Vitals:   11/23/19 0318 11/23/19 0318 11/23/19 0851 11/23/19 1223  BP: (!) 135/74 (!) 135/74 (!) 172/88 (!) 138/87  Pulse: 72 72 99 82  Resp: 18 18 18 18   Temp: 98.6 F (37 C) 98.6 F (37 C) 97.9 F (36.6 C) 98.1 F (36.7 C)  TempSrc: Oral Oral Oral Oral  SpO2: 100% 100% 100% 99%  Weight:      Height:        Intake/Output Summary (Last 24 hours) at 11/23/2019 1324 Last data filed at 11/23/2019 0246 Gross per 24 hour  Intake 231.03 ml  Output 1500 ml  Net -1268.97 ml   Filed Weights   11/20/19 1332  Weight: 78 kg    Examination:  General exam: Appears calm and comfortable  Respiratory system: Clear to auscultation. Respiratory effort normal. Cardiovascular system: S1 & S2 heard, RRR. No JVD, murmurs, rubs, gallops or clicks. No pedal edema. Gastrointestinal system: Abdomen is nondistended, soft and  nontender. No organomegaly or masses felt. Normal bowel sounds heard. Central nervous system: Alert but unable to assess orientation due to aphasia.  Right hemiplegia.    Data Reviewed: I have personally reviewed following labs and imaging studies  CBC: Recent Labs  Lab 11/18/19 2153 11/18/19 2153 11/20/19 1802 11/21/19 0147 11/21/19 0326 11/22/19 1145 11/23/19 0255  WBC 5.5   < > 5.5 7.3 7.0 6.5 5.8  NEUTROABS 3.4  --  3.3  --   --  4.6 3.6  HGB 13.8   < > 14.2 12.8* 13.0 12.4* 13.1  HCT 42.4   < > 44.5 40.6 41.2 38.6* 40.9  MCV 93.2   < >  94.5 92.9 93.8 94.1 94.9  PLT 232   < > 219 222 217 216 193   < > = values in this interval not displayed.   Basic Metabolic Panel: Recent Labs  Lab 11/18/19 2153 11/18/19 2153 11/20/19 1802 11/21/19 0147 11/21/19 0326 11/22/19 1145 11/23/19 0255  NA 142  --  142 143  --  145 143  K 3.7  --  4.2 3.7  --  3.8 4.6  CL 106  --  109 109  --  113* 112*  CO2 27  --  22 25  --  21* 22  GLUCOSE 89  --  121* 121*  --  180* 90  BUN 16  --  32* 24*  --  16 10  CREATININE 1.02   < > 1.42* 1.22 1.31* 1.11 0.90  CALCIUM 9.6  --  9.8 9.5  --  9.3 9.2  MG  --   --   --  2.0  --   --   --    < > = values in this interval not displayed.   GFR: Estimated Creatinine Clearance: 85 mL/min (by C-G formula based on SCr of 0.9 mg/dL). Liver Function Tests: Recent Labs  Lab 11/18/19 2153 11/20/19 1802 11/21/19 0147  AST 20 21 15   ALT 28 25 23   ALKPHOS 95 89 82  BILITOT 0.7 1.1 1.0  PROT 7.2 7.1 6.8  ALBUMIN 4.0 4.2 3.9   Recent Labs  Lab 11/18/19 2153  LIPASE 26   No results for input(s): AMMONIA in the last 168 hours. Coagulation Profile: No results for input(s): INR, PROTIME in the last 168 hours. Cardiac Enzymes: No results for input(s): CKTOTAL, CKMB, CKMBINDEX, TROPONINI in the last 168 hours. BNP (last 3 results) No results for input(s): PROBNP in the last 8760 hours. HbA1C: No results for input(s): HGBA1C in the last 72  hours. CBG: Recent Labs  Lab 11/22/19 1556 11/22/19 2345 11/23/19 0308 11/23/19 0831 11/23/19 1217  GLUCAP 139* 195* 97 148* 166*   Lipid Profile: No results for input(s): CHOL, HDL, LDLCALC, TRIG, CHOLHDL, LDLDIRECT in the last 72 hours. Thyroid Function Tests: No results for input(s): TSH, T4TOTAL, FREET4, T3FREE, THYROIDAB in the last 72 hours. Anemia Panel: No results for input(s): VITAMINB12, FOLATE, FERRITIN, TIBC, IRON, RETICCTPCT in the last 72 hours. Sepsis Labs: No results for input(s): PROCALCITON, LATICACIDVEN in the last 168 hours.  Recent Results (from the past 240 hour(s))  SARS Coronavirus 2 by RT PCR (hospital order, performed in [facility]) Nasopharyngeal Nasopharyngeal Swab     Status: None   Collection Time: 11/20/19  6:18 PM   Specimen: Nasopharyngeal Swab  Result Value Ref Range Status   SARS Coronavirus 2 NEGATIVE NEGATIVE Final    Comment: (NOTE) SARS-CoV-2 target nucleic acids are NOT DETECTED.  The SARS-CoV-2 RNA is generally detectable in upper and lower respiratory specimens during the acute phase of infection. The lowest concentration of SARS-CoV-2 viral copies this assay can detect is 250 copies / mL. A negative result does not preclude SARS-CoV-2 infection and should not be used as the sole basis for treatment or other patient management decisions.  A negative result may occur with improper specimen collection / handling, submission of specimen other than nasopharyngeal swab, presence of viral mutation(s) within the areas targeted by this assay, and inadequate number of viral copies (<250 copies / mL). A negative result must be combined with clinical observations, patient history, and epidemiological information.  Fact Sheet for Patients:  BoilerBrush.com.cy  Fact Sheet for Healthcare Providers: https://pope.com/  This test is not yet approved or  cleared by the [place]  FDA and has been authorized for detection and/or diagnosis of SARS-CoV-2 by FDA under an Emergency Use Authorization (EUA).  This EUA will remain in effect (meaning this test can be used) for the duration of the COVID-19 declaration under Section 564(b)(1) of the Act, 21 U.S.C. section 360bbb-3(b)(1), unless the authorization is terminated or revoked sooner.  Performed at [name], [address], [place], [place] [address]   Urine culture     Status: Abnormal   Collection Time: 11/20/19  6:37 PM   Specimen: Urine, Random  Result Value Ref Range Status   Specimen Description URINE, RANDOM  Final   Special Requests NONE  Final   Culture (A)  Final    <10,000 COLONIES/mL INSIGNIFICANT GROWTH Performed at [name], [address], [place], [place] [address]    Report Status 11/21/2019 FINAL  Final      Radiology Studies: DG Shoulder Right  Result Date: 11/22/2019 CLINICAL DATA:  Shoulder pain. EXAM: RIGHT SHOULDER - 2+ VIEW COMPARISON:  Radiographs 07/15/2019. FINDINGS: AP and Y-views are submitted. The Y view is limited. The bones are demineralized. There is recurrent anterior glenohumeral dislocation without evidence of acute fracture. Underlying glenohumeral and acromioclavicular degenerative changes are present. IMPRESSION: Limited examination with evidence of recurrent anterior glenohumeral dislocation. No evidence of acute fracture. Electronically Signed   By: Carey Bullocks M.D.   On: 11/22/2019 17:33   CT SHOULDER RIGHT WO CONTRAST  Result Date: 11/23/2019 CLINICAL DATA:  Shoulder fracture. EXAM: CT OF THE UPPER RIGHT EXTREMITY WITHOUT CONTRAST TECHNIQUE: Multidetector CT imaging of the right shoulder was performed according to the standard protocol. COMPARISON:  Radiographs 11/22/2019 and 07/15/2019. FINDINGS: Bones/Joint/Cartilage Anterior subluxation of the right glenohumeral joint without frank dislocation. There is resulting narrowing of the  coracohumeral distance to 3 mm. There are underlying moderate glenohumeral degenerative changes with diffuse chondral thinning and osteophytes. No evidence of acute fracture. Mild degenerative changes are present at the acromioclavicular joint. No large shoulder joint effusion. Ligaments Suboptimally assessed by CT. Muscles and Tendons No focal rotator cuff muscular atrophy. There is no significant narrowing of the subacromial space or gross impingement on the supraspinatus or infraspinatus tendons. Due to the anterior glenohumeral subluxation, there is probable chronic encroachment on the subscapularis tendon. Soft tissues Unremarkable. IMPRESSION: 1. 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 Based on prior radiographs, this may be chronic or recurrent. 2. Underlying moderate glenohumeral degenerative changes. 3. No evidence of acute fracture. 4. Probable chronic encroachment on the subscapularis tendon due to the anterior glenohumeral subluxation and resulting subcoracoid impingement. Electronically Signed   By: Carey Bullocks M.D.   On: 11/23/2019 11:51   DG Swallowing Func-Speech Pathology  Result Date: 11/22/2019 Objective Swallowing Evaluation: Type of Study: MBS-Modified Barium Swallow Study  Patient Details Name: Douglas [name]. MRN: [id number] Date of Birth: Sep 18, 1950 Today's Date: 11/22/2019 Time: SLP Start Time (ACUTE ONLY): 1310 -SLP Stop Time (ACUTE ONLY): 1326 SLP Time Calculation (min) (ACUTE ONLY): 16 min Past Medical History: Past Medical History: Diagnosis Date . Acute blood loss anemia  . Acute embolic stroke (HCC)  . Acute lower UTI  . AMS (altered mental status) 06/05/2019 . Cerebral edema (HCC) 04/04/2018 . Diabetes 1.5, managed as type 2 (HCC)  . Diabetes mellitus without complication (HCC)  . History of CVA with residual deficit  .  History of Dysphagia, post-stroke  . Hypertension  . Ischemic stroke (HCC) 03/05/2019 . Labile blood glucose  . 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 Renal insufficiency 04/04/2018 . Stroke (HCC)  . UTI (urinary tract infection) 03/05/2019 Past Surgical History: Past Surgical History: Procedure Laterality Date . IR PATIENT EVAL TECH 0-60 MINS  03/30/2018 . LOOP RECORDER INSERTION N/A 03/08/2019  Procedure: LOOP RECORDER INSERTION;  Surgeon: Regan Lemming, MD;  Location: [facility] INVASIVE CV LAB;  Service: Cardiovascular;  Laterality: N/A; . TEE WITHOUT CARDIOVERSION N/A 04/02/2018  Procedure: TRANSESOPHAGEAL ECHOCARDIOGRAM (TEE);  Surgeon: [name], MD;  Location: [facility] ENDOSCOPY;  Service: Cardiovascular;  Laterality: N/A; HPI: Pt is a 69 y.o. 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 CT of the head and CXR were negative for acute changes. EEG:  eileptogenicity in left frontal-anterior temporal region as well as  cortical dysfunction in left hemisphere likely secondary to underlying infarct and can also be seen in post-ictal state. Pt has a hx of oropharyngeal dysphagia with most recent recommendations for Dysphagia 3 (soft) solids and honey-thick liquids following MBS on 06/18/19.  BSE 07/16/19: prolonged mastication with ground solids and soft solids and suspect prolonged AP transport and delayed swallow initiation with all trials. An immediate cough was observed following thin liquid trials.  A dysphagia 2 diet with honey thick liquids was recommended at that time.  No data recorded Assessment / Plan / Recommendation CHL IP CLINICAL IMPRESSIONS 11/22/2019 Clinical Impression Pt presents with likely acute on chronic oropharyngeal dysphagia characterized by prolonged mastication, impaired bolus cohesion, a pharyngeal delay, and reduced lingual retraction. He demonstrated lingual residue, premature spillage to the valleculae with solids  and to the pyriform sinuses with liquids, and aspiration (PAS 7) with thin liquids via cup and with nectar thick liquids via straw. He was unable to demonstrate compensatory strategies despite cues due to his difficulty following commands. SLP will follow pt to ensure diet tolerance. Dysphagia treatment is recommended; however, the extent to which the pt will be able to participate is questioned due to aphasia.  SLP Visit Diagnosis Dysphagia, oropharyngeal phase (R13.12) Attention and concentration deficit following -- Frontal lobe and executive function deficit following -- Impact on safety and function Mild aspiration risk   CHL IP TREATMENT RECOMMENDATION 11/22/2019 Treatment Recommendations Therapy as outlined in treatment plan below   Prognosis 11/22/2019 Prognosis for Safe Diet Advancement Fair Barriers to Reach Goals Language deficits Barriers/Prognosis Comment -- CHL IP DIET RECOMMENDATION 11/22/2019 SLP Diet Recommendations Dysphagia 2 (Fine chop) solids;Nectar thick liquid Liquid Administration via Cup;No straw Medication Administration Whole meds with puree Compensations Small sips/bites;Slow rate Postural Changes Seated upright at 90 degrees   CHL IP OTHER RECOMMENDATIONS 11/22/2019 Recommended Consults -- Oral Care Recommendations Oral care BID Other Recommendations Order thickener from pharmacy   CHL IP FOLLOW UP RECOMMENDATIONS 11/22/2019 Follow up Recommendations Outpatient SLP;Home health SLP   CHL IP FREQUENCY AND DURATION 11/22/2019 Speech Therapy Frequency (ACUTE ONLY) min 2x/week Treatment Duration 2 weeks      CHL IP ORAL PHASE 11/22/2019 Oral Phase Impaired Oral - Pudding Teaspoon -- Oral - Pudding Cup -- Oral - Honey Teaspoon -- Oral - Honey Cup -- Oral - Nectar Teaspoon -- Oral - Nectar Cup Decreased bolus cohesion;Premature spillage;Lingual/palatal residue Oral - Nectar Straw Decreased bolus cohesion;Premature spillage;Lingual/palatal residue Oral - Thin Teaspoon -- Oral -  Thin Cup Decreased bolus  cohesion;Premature spillage;Lingual/palatal residue Oral - Thin Straw -- Oral - Puree Decreased bolus cohesion;Premature spillage;Lingual/palatal residue Oral - Mech Soft Decreased bolus cohesion;Premature spillage;Lingual/palatal residue;Impaired mastication Oral - Regular Decreased bolus cohesion;Premature spillage;Lingual/palatal residue;Impaired mastication Oral - Multi-Consistency -- Oral - Pill -- Oral Phase - Comment --  CHL IP PHARYNGEAL PHASE 11/22/2019 Pharyngeal Phase Impaired Pharyngeal- Pudding Teaspoon -- Pharyngeal -- Pharyngeal- Pudding Cup -- Pharyngeal -- Pharyngeal- Honey Teaspoon -- Pharyngeal -- Pharyngeal- Honey Cup -- Pharyngeal -- Pharyngeal- Nectar Teaspoon -- Pharyngeal -- Pharyngeal- Nectar Cup Delayed swallow initiation-pyriform sinuses Pharyngeal -- Pharyngeal- Nectar Straw Delayed swallow initiation-pyriform sinuses;Penetration/Aspiration during swallow Pharyngeal Material enters airway, passes BELOW cords and not ejected out despite cough attempt by patient Pharyngeal- Thin Teaspoon -- Pharyngeal -- Pharyngeal- Thin Cup -- Pharyngeal -- Pharyngeal- Thin Straw -- Pharyngeal -- Pharyngeal- Puree Delayed swallow initiation-vallecula;Reduced tongue base retraction Pharyngeal -- Pharyngeal- Mechanical Soft Delayed swallow initiation-vallecula;Delayed swallow initiation-pyriform sinuses;Reduced tongue base retraction Pharyngeal -- Pharyngeal- Regular Delayed swallow initiation-vallecula;Reduced tongue base retraction Pharyngeal -- Pharyngeal- Multi-consistency -- Pharyngeal -- Pharyngeal- Pill Delayed swallow initiation-vallecula;Reduced tongue base retraction Pharyngeal -- Pharyngeal Comment --  CHL IP CERVICAL ESOPHAGEAL PHASE 11/22/2019 Cervical Esophageal Phase WFL Pudding Teaspoon -- Pudding Cup -- Honey Teaspoon -- Honey Cup -- Nectar Teaspoon -- Nectar Cup -- Nectar Straw -- Thin Teaspoon -- Thin Cup -- Thin Straw -- Puree -- Mechanical Soft -- Regular -- Multi-consistency -- Pill --  Cervical Esophageal Comment -- Shanika I. Vear Clock, MS, CCC-SLP Acute Rehabilitation Services Office number [phone] Pager [phone] Scheryl Marten 11/22/2019, 2:00 PM               Scheduled Meds: . amLODipine  10 mg Oral Daily  . aspirin EC  325 mg Oral q morning - 10a  . aspirin  300 mg Rectal Daily  . atorvastatin  40 mg Oral QHS  . baclofen  20 mg Oral BID  . carvedilol  12.5 mg Oral BID WC  . clopidogrel  75 mg Oral Daily  . insulin aspart  0-9 Units Subcutaneous Q4H  . insulin detemir  16 Units Subcutaneous Daily  . lacosamide  100 mg Oral BID  . lamoTRIgine  150 mg Oral BID  . lisinopril  20 mg Oral 2 times per day  . tamsulosin  0.4 mg Oral QPC supper   Continuous Infusions: . sodium chloride 75 mL/hr at 11/23/19 0728     LOS: 3 days   Time spent: 30 minutes  Hughie Closs, MD [facility]  11/23/2019, 1:24 PM   To contact the attending provider between 7A-7P or the covering provider during after hours 7P-7A, please log into the web site [email or web address].

## 2019-11-23 NOTE — Progress Notes (Signed)
Physical Therapy Treatment Patient Details Name: Douglas [name]. MRN: [id number] DOB: February 03, 1951 Today's Date: 11/23/2019    History of Present Illness Douglas [name] is a 69 yo male presenting with AMS and concern for seizures. Upon workup, CT was unremarkable but EEG was suggestive of eileptogenicity in left frontal-anterior temporal region as well as cortical dysfunction in left hemisphere likely secondary to underlying infarct. PMH includes: CVA with R hemiplegia, HTN, DM II, HLD, CHD IIIa, and CHF.    PT Comments    Pt in bed upon arrival of PT/OT, agreeable to session with focus on progression of transfers and mobility. The pt continues to depend on mod/maxA of two to complete transfers in and OOB at this time. He was able to maintain seated balance with min-modA as he has a strong posterior and left lateral lean that requies constant cueing or assist to maintain seated balance. He was unable to complete a full upright stand despite x2 attempts with heavy maxA of 2 and will benefit from continued skilled rehab prior to return home to facilitate improved strength and stability for improved participation in transfers.     Follow Up Recommendations  SNF;Supervision/Assistance - 24 hour     Equipment Recommendations   (defer until OOB mobility is safe to assess, WC given pt's current presentation)    Recommendations for Other Services       Precautions / Restrictions Precautions Precautions: Fall Precaution Comments: residual R hemiplegia, dysphasia Restrictions Weight Bearing Restrictions: No    Mobility  Bed Mobility Overal bed mobility: Needs Assistance Bed Mobility: Rolling;Supine to Sit;Sit to Supine Rolling: Mod assist   Supine to sit: Max assist Sit to supine: Total assist   General bed mobility comments: Pt able to assist very minimally.  He demonstrates significant processing and initiation delay. The pt did show slight initiation of LUE reach when cued to roll  to his R  Transfers Overall transfer level: Needs assistance Equipment used: 2 person hand held assist Transfers: Sit to/from Stand Sit to Stand: Max assist;+2 physical assistance;+2 safety/equipment        Lateral/Scoot Transfers: Max assist General transfer comment: pt requires assist for all aspects.  He is unable to fully extend hips and knees despite verbal and tactile cues  Ambulation/Gait             General Gait Details: pt unable      Balance Overall balance assessment: Needs assistance Sitting-balance support: Single extremity supported;Feet supported Sitting balance-Leahy Scale: Poor Sitting balance - Comments: pt requires min A - mod A for EOB sitting.  He demonstrates Lt and posterior bias  Postural control: Posterior lean;Left lateral lean Standing balance support: Bilateral upper extremity supported Standing balance-Leahy Scale: Poor Standing balance comment: maxA of 2 to maintain partial stand                            Cognition Arousal/Alertness: Awake/alert Behavior During Therapy: Flat affect Overall Cognitive Status: No family/caregiver present to determine baseline cognitive functioning                                 General Comments: Difficult to assess fully due to communication deficits and unknown PLOF. Intermittently able to follow single-step commands with significant cues and extra processing time      Exercises      General Comments General comments (skin integrity, edema, etc.):  Pt supine in bed attempting to drink liquids from his tray upon PT arrival, per SLP recommendations the pt should be upright and have full assist to eat/drink. The pt remained restless and impulsive through this morning's session, stating "yeah" consistently through the session irresponsive to questions/commands.      Pertinent Vitals/Pain Pain Assessment: Faces Faces Pain Scale: Hurts a little bit Pain Location: slight grimacing  with ROM to RUE/R hand Pain Descriptors / Indicators: Grimacing Pain Intervention(s): Repositioned;Monitored during session    Home Living Family/patient expects to be discharged to:: Skilled nursing facility               Additional Comments: Per chart, pt was living with his daughter at home who assisted with ADLs and tansfers     Prior Function Level of Independence: Needs assistance  Gait / Transfers Assistance Needed: requires minA assist to transfer to [facility].   Comments: Pt unable to provide info re: PLOF due to communication deficits.  Attempted to contact daughter without success.  Per chart, he was able to transfer to w/c with +1 assist, and required assist for ADLs    PT Goals (current goals can now be found in the care plan section) Acute Rehab PT Goals PT Goal Formulation: Patient unable to participate in goal setting Time For Goal Achievement: 12/07/19 Potential to Achieve Goals: Good Progress towards PT goals: Progressing toward goals    Frequency    Min 2X/week      PT Plan Current plan remains appropriate    Co-evaluation PT/OT/SLP Co-Evaluation/Treatment: Yes Reason for Co-Treatment: Complexity of the patient's impairments (multi-system involvement);Necessary to address cognition/behavior during functional activity;For patient/therapist safety PT goals addressed during session: Mobility/safety with mobility;Balance OT goals addressed during session: ADL's and self-care;Strengthening/ROM      AM-PAC PT "6 Clicks" Mobility   Outcome Measure  Help needed turning from your back to your side while in a flat bed without using bedrails?: A Lot Help needed moving from lying on your back to sitting on the side of a flat bed without using bedrails?: A Lot Help needed moving to and from a bed to a chair (including a wheelchair)?: Total Help needed standing up from a chair using your arms (e.g., wheelchair or bedside chair)?: A Lot Help needed to walk in hospital  room?: Total Help needed climbing 3-5 steps with a railing? : Total 6 Click Score: 9    End of Session Equipment Utilized During Treatment: Gait belt Activity Tolerance: Patient limited by fatigue Patient left: in bed;with bed alarm set;with call bell/phone within reach Nurse Communication: Mobility status PT Visit Diagnosis: Muscle weakness (generalized) (M62.81);Other abnormalities of gait and mobility (R26.89)     Time: 3007-6226 PT Time Calculation (min) (ACUTE ONLY): 33 min  Charges:  $Gait Training: 8-22 mins                     Rolm Baptise, PT, DPT   Acute Rehabilitation Department Pager #: [phone]   Gaetana Michaelis 11/23/2019, 12:35 PM

## 2019-11-23 NOTE — Progress Notes (Addendum)
Patient refusing all morning medication. Nurse educated patient on importance of taking medication, however patient still refused. Will re-attempt. MD notified.  Melony Overly, RN

## 2019-11-23 NOTE — Evaluation (Signed)
Physical Therapy Evaluation Patient Details Name: Douglas [name]. MRN: [id number] DOB: Mar 14, 1951 Today's Date: 11/23/2019   History of Present Illness  Nori Poland is a 69 yo male presenting with AMS and concern for seizures. Upon workup, CT was unremarkable but EEG was suggestive of eileptogenicity in left frontal-anterior temporal region as well as cortical dysfunction in left hemisphere likely secondary to underlying infarct. PMH includes: CVA with R hemiplegia, HTN, DM II, HLD, CHD IIIa, and CHF.  Clinical Impression  Pt in bed upon arrival of PT, attempting to feed himself while supine in bed. Per pt's chart, prior to admission the pt was transferring with minA of 1. The pt now presents with limitations in functional mobility, stability, strength, coordination, and activity tolerance due to above dx and chronically reduced mobility, and will continue to benefit from skilled PT to address these deficits. The session was limited due to significant restlessness and deficits in seated balance requiring mod/maxA to maintain sitting EOB while drinking. The pt was unable to complete lateral scoot transfers without maxA of 1 at this time, so further OOB transfers were deferred until +2 assist is available for the safety of the pt and therapist. Given the pt's current mobility, he will likely require rehab following d/c to return to prior functioning where his family can safely mobilize the pt.      Follow Up Recommendations SNF;Supervision/Assistance - 24 hour    Equipment Recommendations   (defer until OOB mobility is safe to assess, WC given pt's current presentation)    Recommendations for Other Services       Precautions / Restrictions Precautions Precautions: Fall Precaution Comments: residual R hemiplegia, dysphasia Restrictions Weight Bearing Restrictions: No      Mobility  Bed Mobility Overal bed mobility: Needs Assistance Bed Mobility: Rolling;Supine to Sit;Sit to  Supine Rolling: Mod assist   Supine to sit: Max assist;HOB elevated Sit to supine: Mod assist   General bed mobility comments: Pt eager to rise to sitting position, but requires assist with BLE to come to EOB and maxA to pull to sitting position. modA to maintain sitting EOB. ModA to return to supine, pt able to lower trunk, modA to BLE  Transfers Overall transfer level: Needs assistance Equipment used: 1 person hand held assist Transfers: Lateral/Scoot Transfers          Lateral/Scoot Transfers: Max assist General transfer comment: maxA with use of bed pad to scoot laterally along EOB, pt with limited functional assist at this time, recommend +2 assist to attempt further transfers.     Balance Overall balance assessment: Needs assistance Sitting-balance support: Single extremity supported;Feet supported Sitting balance-Leahy Scale: Poor Sitting balance - Comments: requires mod/maxA to maintain seated position Postural control: Posterior lean;Left lateral lean Standing balance support:  (pt unable to attempt)                                 Pertinent Vitals/Pain Pain Assessment: No/denies pain Faces Pain Scale: No hurt    Home Living                   Additional Comments: Pt unable to give history, per chart the pt lives with his daughter    Prior Function Level of Independence: Needs assistance   Gait / Transfers Assistance Needed: requires minA assist to transfer to [facility].     Comments: From chart, pt is able to complete transfers with minA,  unable to get in contact with his daughter at this time, will continue to attempt to reach her for more information     Hand Dominance   Dominant Hand: Left    Extremity/Trunk Assessment   Upper Extremity Assessment Upper Extremity Assessment: Defer to OT evaluation;RUE deficits/detail RUE Deficits / Details: residual R spastic hemiplegia    Lower Extremity Assessment Lower Extremity Assessment:  Generalized weakness;RLE deficits/detail RLE Deficits / Details: residual R-sided spastic hemiplegia, pt with poor functional control or use       Communication   Communication: Expressive difficulties;Receptive difficulties  Cognition Arousal/Alertness: Awake/alert Behavior During Therapy: Restless;Impulsive Overall Cognitive Status: No family/caregiver present to determine baseline cognitive functioning                                 General Comments: Pt with history of receptive and expressive difficulties, able to follow some simple commands, but poor insight to deficits and safety. Pt attempting to drink in supine position unsupervised this morning despite SLP recommendations.      General Comments General comments (skin integrity, edema, etc.): Pt supine in bed attempting to drink liquids from his tray upon PT arrival, per SLP recommendations the pt should be upright and have full assist to eat/drink. The pt remained restless and impulsive through this morning's session, stating "yeah" consistently through the session irresponsive to questions/commands.    Exercises     Assessment/Plan    PT Assessment Patient needs continued PT services  PT Problem List Decreased strength;Decreased coordination;Decreased mobility;Decreased safety awareness;Decreased range of motion;Decreased knowledge of precautions;Decreased balance;Decreased activity tolerance       PT Treatment Interventions DME instruction;Therapeutic exercise;Balance training;Neuromuscular re-education;Functional mobility training;Therapeutic activities;Patient/family education;Cognitive remediation;Gait training    PT Goals (Current goals can be found in the Care Plan section)  Acute Rehab PT Goals PT Goal Formulation: Patient unable to participate in goal setting Time For Goal Achievement: 12/07/19 Potential to Achieve Goals: Good    Frequency Min 2X/week   Barriers to discharge Decreased caregiver  support PTA pt transfering with minA, currently requiring assist of 2       AM-PAC PT "6 Clicks" Mobility  Outcome Measure Help needed turning from your back to your side while in a flat bed without using bedrails?: A Lot Help needed moving from lying on your back to sitting on the side of a flat bed without using bedrails?: A Lot Help needed moving to and from a bed to a chair (including a wheelchair)?: Total Help needed standing up from a chair using your arms (e.g., wheelchair or bedside chair)?: Total Help needed to walk in hospital room?: Total Help needed climbing 3-5 steps with a railing? : Total 6 Click Score: 8    End of Session   Activity Tolerance: Patient limited by fatigue Patient left: in bed;with bed alarm set;with call bell/phone within reach Nurse Communication: Mobility status (pt restlessness and attempts to eat by himself) PT Visit Diagnosis: Muscle weakness (generalized) (M62.81);Other abnormalities of gait and mobility (R26.89)    Time: 4268-3419 PT Time Calculation (min) (ACUTE ONLY): 16 min   Charges:   PT Evaluation $PT Eval Moderate Complexity: 1 Mod          Rolm Baptise, PT, DPT   Acute Rehabilitation Department Pager #: [phone]  Gaetana Michaelis 11/23/2019, 8:57 AM

## 2019-11-24 LAB — GLUCOSE, CAPILLARY
Glucose-Capillary: 160 mg/dL — ABNORMAL HIGH (ref 70–99)
Glucose-Capillary: 167 mg/dL — ABNORMAL HIGH (ref 70–99)
Glucose-Capillary: 182 mg/dL — ABNORMAL HIGH (ref 70–99)
Glucose-Capillary: 193 mg/dL — ABNORMAL HIGH (ref 70–99)
Glucose-Capillary: 205 mg/dL — ABNORMAL HIGH (ref 70–99)

## 2019-11-24 LAB — CBC WITH DIFFERENTIAL/PLATELET
Abs Immature Granulocytes: 0.03 10*3/uL (ref 0.00–0.07)
Basophils Absolute: 0 10*3/uL (ref 0.0–0.1)
Basophils Relative: 1 %
Eosinophils Absolute: 0.2 10*3/uL (ref 0.0–0.5)
Eosinophils Relative: 3 %
HCT: 37.9 % — ABNORMAL LOW (ref 39.0–52.0)
Hemoglobin: 12.3 g/dL — ABNORMAL LOW (ref 13.0–17.0)
Immature Granulocytes: 1 %
Lymphocytes Relative: 25 %
Lymphs Abs: 1.6 10*3/uL (ref 0.7–4.0)
MCH: 29.8 pg (ref 26.0–34.0)
MCHC: 32.5 g/dL (ref 30.0–36.0)
MCV: 91.8 fL (ref 80.0–100.0)
Monocytes Absolute: 0.5 10*3/uL (ref 0.1–1.0)
Monocytes Relative: 8 %
Neutro Abs: 4.1 10*3/uL (ref 1.7–7.7)
Neutrophils Relative %: 62 %
Platelets: 215 10*3/uL (ref 150–400)
RBC: 4.13 MIL/uL — ABNORMAL LOW (ref 4.22–5.81)
RDW: 13.1 % (ref 11.5–15.5)
WBC: 6.5 10*3/uL (ref 4.0–10.5)
nRBC: 0 % (ref 0.0–0.2)

## 2019-11-24 MED ORDER — INSULIN DETEMIR 100 UNIT/ML ~~LOC~~ SOLN
20.0000 [IU] | Freq: Every day | SUBCUTANEOUS | Status: DC
  Administered 2019-11-24 – 2019-11-27 (×4): 20 [IU] via SUBCUTANEOUS
  Filled 2019-11-24 (×4): qty 0.2

## 2019-11-24 MED ORDER — HALOPERIDOL LACTATE 5 MG/ML IJ SOLN
2.0000 mg | Freq: Four times a day (QID) | INTRAMUSCULAR | Status: DC | PRN
  Administered 2019-11-24: 2 mg via INTRAVENOUS
  Filled 2019-11-24: qty 1

## 2019-11-24 NOTE — Progress Notes (Signed)
PROGRESS NOTE    Douglas [name].  YJE:[id number] DOB: 21-Sep-1950 DOA: 11/20/2019 PCP: Douglas Ok, MD   Brief Narrative:  [name]. is a 69 y.o. male with history of right-sided hemiplegia from previous stroke, hypertension, diabetes mellitus type 2 was noticed to have staring spells as per the patient's daughter.  The first episode was around 48 hours ago in the evening which lasted for few seconds and then got corrected.  The next one was noticed the morning of presentation when patient woke up.  He was then brought to the ER at that point.  In the ER CT head was unremarkable.  There was some concern for status epilepticus and neurologist on-call was consulted patient was given increased dose of Lamictal and 1 dose of Vimpat was given.    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  Patient was loaded with Vimpat 200 mg IV and currently on 100 mg IV twice daily. cEEG shows epileptiform activity in left frontal anterior temporal region as well as cortical dysfunction in the left hemisphere.  Patient was on IV Vimpat since he was failing swallow evaluation.  Now status post modified barium swallow and per speech therapist/he can have a dysphagia 2 diet.  He can take all p.o. meds.  I had a long discussion with Dr. Derry [name] of neurology who recommends increasing Lamictal to 150 mg twice daily and continuing Vimpat 100 mg p.o. twice daily until 11/23/2019.  We will discontinue Vimpat today.  Patient will continue on Lamictal.  Patient alert at his baseline.  Essential hypertension: Blood pressure controlled.  Continue home medications.  Dysphagia: Now cleared  to have dysphagia 2 diet by SLP.  Hyperlipidemia: Continue atorvastatin   COPD: Stable.  Continue home inhalers.  Type 2 diabetes mellitus: Takes glipizide and 16 units of Lantus and sliding scale as well at home.  Blood sugar slightly elevated now so I will increase his Lantus to 20 units and continue SSI.  History of stroke with right-sided hemiplegia: Stable.  Chronic kidney disease stage IIIa: At baseline.  Chronic diastolic congestive heart failure: Appears euvolemic.  Echo done in November 2020 shows grade 1 DD, will continue gentle hydration as I am unsure how much p.o. he is going to take.  Right shoulder dislocation/Pseudosubluxation: S/p x-ray and CT scan.  Seen by orthopedics.  Per them, no role for intervention or further evaluation.  They also discussed with daughter and reassured her.  DVT prophylaxis:    Code Status: Full Code  Family Communication: None present at bedside.  Called and spoke to his daughter Douglas [name] on 11/23/2019.  Status is: Inpatient  Remains inpatient appropriate because: Unsafe DC plan   Dispo: The patient is from: Home              Anticipated d/c is to: SNF              Anticipated d/c date is: 11/25/2019              Patient currently is medically stable to d/c.    Estimated body mass index is 23.32 kg/m as calculated from the following:   Height as  of this encounter: 6' (1.829 m).   Weight as of this encounter: 78 kg.  Pressure Injury 06/08/19 Buttocks Right;Left;Upper Stage 2 -  Partial thickness loss of dermis presenting as a shallow open injury with a red, pink wound bed without slough. (Active)  06/08/19 0645  Location: Buttocks  Location Orientation: Right;Left;Upper  Staging: Stage 2 -  Partial thickness loss of dermis presenting as a shallow open injury with a red, pink wound bed without slough.  Wound Description (Comments):   Present on Admission:      Nutritional status:               Consultants:    Neurology  Procedures:   None  Antimicrobials:  Anti-infectives (From admission, onward)   None         Subjective: Seen and examined.  Remains alert.  Aphasic.  Unable to assess orientation.  This is his baseline.  Following all commands.  Objective: Vitals:   11/23/19 2303 11/24/19 0414 11/24/19 0455 11/24/19 0824  BP: (!) 106/63 (!) 154/80  (!) 158/87  Pulse: 72 78  93  Resp: 18 18  20   Temp: 97.6 F (36.4 C) (!) 100.4 F (38 C) 99.7 F (37.6 C) 98.6 F (37 C)  TempSrc: Oral Oral Oral Oral  SpO2: 100% 100%  100%  Weight:      Height:        Intake/Output Summary (Last 24 hours) at 11/24/2019 1153 Last data filed at 11/24/2019 0824 Gross per 24 hour  Intake 600 ml  Output 2000 ml  Net -1400 ml   Filed Weights   11/20/19 1332  Weight: 78 kg    Examination:  General exam: Appears calm and comfortable  Respiratory system: Clear to auscultation. Respiratory effort normal. Cardiovascular system: S1 & S2 heard, RRR. No JVD, murmurs, rubs, gallops or clicks. No pedal edema. Gastrointestinal system: Abdomen is nondistended, soft and nontender. No organomegaly or masses felt. Normal bowel sounds heard. Central nervous system: Alert but unable to assess orientation.  Right hemiplegia.  Aphasia.  Data Reviewed: I have personally reviewed following labs and imaging studies  CBC: Recent Labs  Lab 11/18/19 2153 11/18/19 2153 11/20/19 1802 11/20/19 1802 11/21/19 0147 11/21/19 0326 11/22/19 1145 11/23/19 0255 11/24/19 0429  WBC 5.5   < > 5.5   < > 7.3 7.0 6.5 5.8 6.5  NEUTROABS 3.4  --  3.3  --   --   --  4.6 3.6 4.1  HGB 13.8   < > 14.2   < > 12.8* 13.0 12.4* 13.1 12.3*  HCT 42.4   < > 44.5   < > 40.6 41.2 38.6* 40.9 37.9*  MCV 93.2   < > 94.5   < > 92.9 93.8 94.1 94.9 91.8  PLT 232   < > 219   < > 222 217 216 193 215   < > = values in this interval not displayed.   Basic Metabolic Panel: Recent Labs  Lab 11/18/19 2153 11/18/19 2153 11/20/19 1802  11/21/19 0147 11/21/19 0326 11/22/19 1145 11/23/19 0255  NA 142  --  142 143  --  145 143  K 3.7  --  4.2 3.7  --  3.8 4.6  CL 106  --  109 109  --  113* 112*  CO2 27  --  22 25  --  21* 22  GLUCOSE 89  --  121* 121*  --  180* 90  BUN 16  --  32* 24*  --  16 10  CREATININE 1.02   < > 1.42* 1.22 1.31* 1.11 0.90  CALCIUM 9.6  --  9.8 9.5  --  9.3 9.2  MG  --   --   --  2.0  --   --   --    < > = values in this interval not displayed.   GFR: Estimated Creatinine Clearance: 85 mL/min (by C-G formula based on SCr of 0.9 mg/dL). Liver Function Tests: Recent Labs  Lab 11/18/19 2153 11/20/19 1802 11/21/19 0147  AST 20 21 15   ALT 28 25 23   ALKPHOS 95 89 82  BILITOT 0.7 1.1 1.0  PROT 7.2 7.1 6.8  ALBUMIN 4.0 4.2 3.9   Recent Labs  Lab 11/18/19 2153  LIPASE 26   No results for input(s): AMMONIA in the last 168 hours. Coagulation Profile: No results for input(s): INR, PROTIME in the last 168 hours. Cardiac Enzymes: No results for input(s): CKTOTAL, CKMB, CKMBINDEX, TROPONINI in the last 168 hours. BNP (last 3 results) No results for input(s): PROBNP in the last 8760 hours. HbA1C: No results for input(s): HGBA1C in the last 72 hours. CBG: Recent Labs  Lab 11/23/19 1217 11/23/19 1717 11/23/19 2011 11/23/19 2306 11/24/19 0833  GLUCAP 166* 232* 242* 241* 160*   Lipid Profile: No results for input(s): CHOL, HDL, LDLCALC, TRIG, CHOLHDL, LDLDIRECT in the last 72 hours. Thyroid Function Tests: No results for input(s): TSH, T4TOTAL, FREET4, T3FREE, THYROIDAB in the last 72 hours. Anemia Panel: No results for input(s): VITAMINB12, FOLATE, FERRITIN, TIBC, IRON, RETICCTPCT in the last 72 hours. Sepsis Labs: No results for input(s): PROCALCITON, LATICACIDVEN in the last 168 hours.  Recent Results (from the past 240 hour(s))  SARS Coronavirus 2 by RT PCR (hospital order, performed in [facility] hospital lab) Nasopharyngeal Nasopharyngeal Swab     Status: None   Collection  Time: 11/20/19  6:18 PM   Specimen: Nasopharyngeal Swab  Result Value Ref Range Status   SARS Coronavirus 2 NEGATIVE NEGATIVE Final    Comment: (NOTE) SARS-CoV-2 target nucleic acids are NOT DETECTED.  The SARS-CoV-2 RNA is generally detectable in upper and lower respiratory specimens during the acute phase of infection. The lowest concentration of SARS-CoV-2 viral copies this assay can detect is 250 copies / mL. A negative result does not preclude SARS-CoV-2 infection and should not be used as the sole basis for treatment or other patient management decisions.  A negative result may occur with improper specimen collection / handling, submission of specimen other than nasopharyngeal swab, presence of viral mutation(s) within the areas targeted by this assay, and inadequate number of viral copies (<250 copies / mL). A negative result must be combined with clinical observations, patient history, and epidemiological information.  Fact Sheet for Patients:   [facility]  Fact Sheet for Healthcare Providers: 11/22/19  This test is not yet approved or  cleared by the [employer] FDA and has been authorized for detection and/or diagnosis of SARS-CoV-2 by FDA under an Emergency Use Authorization (EUA).  This EUA will remain in effect (meaning this test can be used) for the duration of the COVID-19 declaration under Section 564(b)(1) of the Act, 21 U.S.C. section 360bbb-3(b)(1), unless the authorization is terminated or revoked sooner.  Performed at [facility], [address], [place], [address] [place]   Urine culture     Status: Abnormal   Collection Time: 11/20/19  6:37 PM   Specimen: Urine, Random  Result Value Ref Range Status   Specimen  Description URINE, RANDOM  Final   Special Requests NONE  Final   Culture (A)  Final    <10,000 COLONIES/mL INSIGNIFICANT GROWTH Performed at [facility], [address] [place], [place] [address]    Report Status 11/21/2019 FINAL  Final      Radiology Studies: DG Shoulder Right  Result Date: 11/22/2019 CLINICAL DATA:  Shoulder pain. EXAM: RIGHT SHOULDER - 2+ VIEW COMPARISON:  Radiographs 07/15/2019. FINDINGS: AP and Y-views are submitted. The Y view is limited. The bones are demineralized. There is recurrent anterior glenohumeral dislocation without evidence of acute fracture. Underlying glenohumeral and acromioclavicular degenerative changes are present. IMPRESSION: Limited examination with evidence of recurrent anterior glenohumeral dislocation. No evidence of acute fracture. Electronically Signed   By: Carey BullocksWilliam  Veazey M.D.   On: 11/22/2019 17:33   CT SHOULDER RIGHT WO CONTRAST  Result Date: 11/23/2019 CLINICAL DATA:  Shoulder fracture. EXAM: CT OF THE UPPER RIGHT EXTREMITY WITHOUT CONTRAST TECHNIQUE: Multidetector CT imaging of the right shoulder was performed according to the standard protocol. COMPARISON:  Radiographs 11/22/2019 and 07/15/2019. FINDINGS: Bones/Joint/Cartilage Anterior subluxation of the right glenohumeral joint without frank dislocation. There is resulting narrowing of the coracohumeral distance to 3 mm. There are underlying moderate glenohumeral degenerative changes with diffuse chondral thinning and osteophytes. No evidence of acute fracture. Mild degenerative changes are present at the acromioclavicular joint. No large shoulder joint effusion. Ligaments Suboptimally assessed by CT. Muscles and Tendons No focal rotator cuff muscular atrophy. There is no significant narrowing of the subacromial space or gross impingement on the supraspinatus or infraspinatus tendons. Due to the anterior glenohumeral subluxation, there is probable chronic encroachment on the subscapularis tendon. Soft tissues Unremarkable. IMPRESSION: 1. 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 Based on prior radiographs, this may be  chronic or recurrent. 2. Underlying moderate glenohumeral degenerative changes. 3. No evidence of acute fracture. 4. Probable chronic encroachment on the subscapularis tendon due to the anterior glenohumeral subluxation and resulting subcoracoid impingement. Electronically Signed   By: Carey BullocksWilliam  Veazey M.D.   On: 11/23/2019 11:51   DG Swallowing Func-Speech Pathology  Result Date: 11/22/2019 Objective Swallowing Evaluation: Type of Study: MBS-Modified Barium Swallow Study  Patient Details Name: Douglas CordiaWilliam L Pfenning Jr. MRN: [id number] Date of Birth: May 26, 1950 Today's Date: 11/22/2019 Time: SLP Start Time (ACUTE ONLY): 1310 -SLP Stop Time (ACUTE ONLY): 1326 SLP Time Calculation (min) (ACUTE ONLY): 16 min Past Medical History: Past Medical History: Diagnosis Date . Acute blood loss anemia  . Acute embolic stroke (HCC)  . Acute lower UTI  . AMS (altered mental status) 06/05/2019 . Cerebral edema (HCC) 04/04/2018 . Diabetes 1.5, managed as type 2 (HCC)  . Diabetes mellitus without complication (HCC)  . History of CVA with residual deficit  . History of Dysphagia, post-stroke  . Hypertension  . Ischemic stroke (HCC) 03/05/2019 . Labile blood glucose  . 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 Renal insufficiency 04/04/2018 . Stroke (HCC)  . UTI (urinary tract infection) 03/05/2019 Past Surgical History: Past Surgical History: Procedure Laterality Date . IR PATIENT EVAL TECH 0-60 MINS  03/30/2018 . LOOP RECORDER INSERTION N/A 03/08/2019  Procedure: LOOP RECORDER INSERTION;  Surgeon: Regan Lemmingamnitz, Will Martin, MD;  Location: [facility] INVASIVE CV LAB;  Service: Cardiovascular;  Laterality: N/A; . TEE WITHOUT CARDIOVERSION N/A 04/02/2018  Procedure: TRANSESOPHAGEAL ECHOCARDIOGRAM (TEE);  Surgeon: Chilton Siandolph, Tiffany, MD;  [facility];  Service: Cardiovascular;  Laterality: N/A; HPI: Pt is a 69 y.o. male with history of  right-sided hemiplegia and aphasia from previous stroke, hypertension, diabetes mellitus type 2 was noticed  to have staring spells as per the patient's daughter, "not making sense", and hallucinating. CT of the head and CXR were negative for acute changes. EEG:  eileptogenicity in left frontal-anterior temporal region as well as  cortical dysfunction in left hemisphere likely secondary to underlying infarct and can also be seen in post-ictal state. Pt has a hx of oropharyngeal dysphagia with most recent recommendations for Dysphagia 3 (soft) solids and honey-thick liquids following MBS on 06/18/19.  BSE 07/16/19: prolonged mastication with ground solids and soft solids and suspect prolonged AP transport and delayed swallow initiation with all trials. An immediate cough was observed following thin liquid trials.  A dysphagia 2 diet with honey thick liquids was recommended at that time.  No data recorded Assessment / Plan / Recommendation CHL IP CLINICAL IMPRESSIONS 11/22/2019 Clinical Impression Pt presents with likely acute on chronic oropharyngeal dysphagia characterized by prolonged mastication, impaired bolus cohesion, a pharyngeal delay, and reduced lingual retraction. He demonstrated lingual residue, premature spillage to the valleculae with solids and to the pyriform sinuses with liquids, and aspiration (PAS 7) with thin liquids via cup and with nectar thick liquids via straw. He was unable to demonstrate compensatory strategies despite cues due to his difficulty following commands. SLP will follow pt to ensure diet tolerance. Dysphagia treatment is recommended; however, the extent to which the pt will be able to participate is questioned due to aphasia.  SLP Visit Diagnosis Dysphagia, oropharyngeal phase (R13.12) Attention and concentration deficit following -- Frontal lobe and executive function deficit following -- Impact on safety and function Mild aspiration risk   CHL IP TREATMENT RECOMMENDATION 11/22/2019 Treatment Recommendations Therapy as outlined in treatment plan below   Prognosis 11/22/2019 Prognosis for  Safe Diet Advancement Fair Barriers to Reach Goals Language deficits Barriers/Prognosis Comment -- CHL IP DIET RECOMMENDATION 11/22/2019 SLP Diet Recommendations Dysphagia 2 (Fine chop) solids;Nectar thick liquid Liquid Administration via Cup;No straw Medication Administration Whole meds with puree Compensations Small sips/bites;Slow rate Postural Changes Seated upright at 90 degrees   CHL IP OTHER RECOMMENDATIONS 11/22/2019 Recommended Consults -- Oral Care Recommendations Oral care BID Other Recommendations Order thickener from pharmacy   CHL IP FOLLOW UP RECOMMENDATIONS 11/22/2019 Follow up Recommendations Outpatient SLP;Home health SLP   CHL IP FREQUENCY AND DURATION 11/22/2019 Speech Therapy Frequency (ACUTE ONLY) min 2x/week Treatment Duration 2 weeks      CHL IP ORAL PHASE 11/22/2019 Oral Phase Impaired Oral - Pudding Teaspoon -- Oral - Pudding Cup -- Oral - Honey Teaspoon -- Oral - Honey Cup -- Oral - Nectar Teaspoon -- Oral - Nectar Cup Decreased bolus cohesion;Premature spillage;Lingual/palatal residue Oral - Nectar Straw Decreased bolus cohesion;Premature spillage;Lingual/palatal residue Oral - Thin Teaspoon -- Oral - Thin Cup Decreased bolus cohesion;Premature spillage;Lingual/palatal residue Oral - Thin Straw -- Oral - Puree Decreased bolus cohesion;Premature spillage;Lingual/palatal residue Oral - Mech Soft Decreased bolus cohesion;Premature spillage;Lingual/palatal residue;Impaired mastication Oral - Regular Decreased bolus cohesion;Premature spillage;Lingual/palatal residue;Impaired mastication Oral - Multi-Consistency -- Oral - Pill -- Oral Phase - Comment --  CHL IP PHARYNGEAL PHASE 11/22/2019 Pharyngeal Phase Impaired Pharyngeal- Pudding Teaspoon -- Pharyngeal -- Pharyngeal- Pudding Cup -- Pharyngeal -- Pharyngeal- Honey Teaspoon -- Pharyngeal -- Pharyngeal- Honey Cup -- Pharyngeal -- Pharyngeal- Nectar Teaspoon -- Pharyngeal -- Pharyngeal- Nectar Cup Delayed swallow initiation-pyriform sinuses  Pharyngeal -- Pharyngeal- Nectar Straw Delayed swallow initiation-pyriform sinuses;Penetration/Aspiration during swallow Pharyngeal Material enters airway, passes BELOW cords and not ejected out despite  cough attempt by patient Pharyngeal- Thin Teaspoon -- Pharyngeal -- Pharyngeal- Thin Cup -- Pharyngeal -- Pharyngeal- Thin Straw -- Pharyngeal -- Pharyngeal- Puree Delayed swallow initiation-vallecula;Reduced tongue base retraction Pharyngeal -- Pharyngeal- Mechanical Soft Delayed swallow initiation-vallecula;Delayed swallow initiation-pyriform sinuses;Reduced tongue base retraction Pharyngeal -- Pharyngeal- Regular Delayed swallow initiation-vallecula;Reduced tongue base retraction Pharyngeal -- Pharyngeal- Multi-consistency -- Pharyngeal -- Pharyngeal- Pill Delayed swallow initiation-vallecula;Reduced tongue base retraction Pharyngeal -- Pharyngeal Comment --  CHL IP CERVICAL ESOPHAGEAL PHASE 11/22/2019 Cervical Esophageal Phase WFL Pudding Teaspoon -- Pudding Cup -- Honey Teaspoon -- Honey Cup -- Nectar Teaspoon -- Nectar Cup -- Nectar Straw -- Thin Teaspoon -- Thin Cup -- Thin Straw -- Puree -- Mechanical Soft -- Regular -- Multi-consistency -- Pill -- Cervical Esophageal Comment -- Shanika I. Vear Clock, MS, CCC-SLP Acute Rehabilitation Services Office number [phone] Pager [phone] Scheryl Marten 11/22/2019, 2:00 PM               Scheduled Meds: . amLODipine  10 mg Oral Daily  . aspirin EC  325 mg Oral q morning - 10a  . atorvastatin  40 mg Oral QHS  . baclofen  20 mg Oral BID  . carvedilol  12.5 mg Oral BID WC  . clopidogrel  75 mg Oral Daily  . insulin aspart  0-9 Units Subcutaneous Q4H  . insulin detemir  20 Units Subcutaneous Daily  . lacosamide  100 mg Oral BID  . lamoTRIgine  150 mg Oral BID  . lisinopril  20 mg Oral 2 times per day  . tamsulosin  0.4 mg Oral QPC supper   Continuous Infusions: . sodium chloride 75 mL/hr at 11/23/19 0728     LOS: 4 days   Time spent: 27  minutes  Hughie Closs, MD [facility]  11/24/2019, 11:53 AM   To contact the attending provider between 7A-7P or the covering provider during after hours 7P-7A, please log into the web site [email or web address].

## 2019-11-24 NOTE — TOC Initial Note (Signed)
Transition of Care (TOC) - Initial/Assessment Note    Patient Details  Name: Douglas [name]. MRN: [id number] Date of Birth: 01-28-1951  Transition of Care [facility]) CM/SW Contact:    Nonda Lou, LCSWA Phone Number: 11/24/2019, 8:40 AM  Clinical Narrative:                 CSW spoke with patient's daughter Cala Bradford regarding PT recommendation for SNF placement at discharge. Daughter stated PTA, patient lived with her and ambulated with a walker. She provided some help for ADLs. Daughter is in agreement with SNF placement at discharge. Patient has been to [facility] and [facility] in the past. Permission provided to fax patient out to [facility]. Patient is not vaccinated.  Expected Discharge Plan: Skilled Nursing Facility Barriers to Discharge: Continued Medical Work up   Patient Goals and CMS Choice   CMS Medicare.gov Compare Post Acute Care list provided to:: Patient Represenative (must comment) Cala Bradford, daughter) Choice offered to / list presented to : Adult Children  Expected Discharge Plan and Services Expected Discharge Plan: Skilled Nursing Facility       Living arrangements for the past 2 months: Single Family Home                                      Prior Living Arrangements/Services Living arrangements for the past 2 months: Single Family Home Lives with:: Adult Children Patient language and need for interpreter reviewed:: Yes Do you feel safe going back to the place where you live?: Yes      Need for Family Participation in Patient Care: Yes (Comment) Care giver support system in place?: Yes (comment)   Criminal Activity/Legal Involvement Pertinent to Current Situation/Hospitalization: No - Comment as needed  Activities of Daily Living      Permission Sought/Granted Permission sought to share information with : Facility Medical sales representative, Family Supports    Share Information with NAME: Douglas [name]  Permission granted to share  info w AGENCY: SNFs  Permission granted to share info w Relationship: Daughter  Permission granted to share info w Contact Information: [phone]  Emotional Assessment   Attitude/Demeanor/Rapport: Unable to Assess Affect (typically observed): Unable to Assess Orientation: : Oriented to Self Alcohol / Substance Use: Not Applicable Psych Involvement: No (comment)  Admission diagnosis:  Seizure (HCC) [R56.9] Seizures (HCC) [R56.9] Altered mental status, unspecified altered mental status type [R41.82] Patient Active Problem List   Diagnosis Date Noted  . Seizure (HCC) 11/20/2019  . Seizures (HCC) 11/20/2019  . Encephalopathy 11/19/2019  . Delayed orthostatic hypotension 08/04/2019  . Seizure disorder (HCC) 07/21/2019  . 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 Shoulder subluxation, right, sequela 07/16/2019  . Altered mental status 07/15/2019  . 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 Late effects of CVA (cerebrovascular accident)   . Pressure injury of skin 06/08/2019  . History of loop recorder 03/08/2019  . UTI (urinary tract infection) 03/05/2019  . 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 Global aphasia   . Type 2 diabetes mellitus with peripheral neuropathy (HCC)   . Hypertension associated with diabetes (HCC)   . Hyperlipidemia associated with type 2 diabetes mellitus (HCC)   . History of Dysphagia, post-stroke    PCP:  Ralene Ok, MD Pharmacy:   [id number] - [place], [place] - [address] [place] [address] Phone: [phone] Fax: [phone]     Social Determinants of Health (SDOH) Interventions    Readmission Risk Interventions No flowsheet data found.

## 2019-11-24 NOTE — Consult Note (Signed)
Orthopaedic Trauma Service Consultation  Reason for Consult: Right shoulder deformity Referring Physician: Dr. Hughie Closs  Douglas Cordia. is an 69 y.o. male.  HPI: Recurrent concern about deformity of the right shoulder with reported history of dislocation. No function on the right following stroke but family concerns have prompted further orthopaedic evaluation.   Past Medical History:  Diagnosis Date  . Acute blood loss anemia   . Acute embolic stroke (HCC)   . Acute lower UTI   . AMS (altered mental status) 06/05/2019  . Cerebral edema (HCC) 04/04/2018  . Diabetes 1.5, managed as type 2 (HCC)   . Diabetes mellitus without complication (HCC)   . History of CVA with residual deficit   . History of Dysphagia, post-stroke   . Hypertension   . Ischemic stroke (HCC) 03/05/2019  . Labile blood glucose   . 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 Renal insufficiency 04/04/2018  . Stroke (HCC)   . UTI (urinary tract infection) 03/05/2019    Past Surgical History:  Procedure Laterality Date  . IR PATIENT EVAL TECH 0-60 MINS  03/30/2018  . LOOP RECORDER INSERTION N/A 03/08/2019   Procedure: LOOP RECORDER INSERTION;  Surgeon: Regan Lemming, MD;  [facility]: [facility];  Service: Cardiovascular;  Laterality: N/A;  . TEE WITHOUT CARDIOVERSION N/A 04/02/2018   Procedure: TRANSESOPHAGEAL ECHOCARDIOGRAM (TEE);  Surgeon: [name], MD;  Location: [facility] ENDOSCOPY;  Service: Cardiovascular;  Laterality: N/A;    Family History  Problem Relation Age of Onset  . Hypertension Mother   . Hypertension Father     Social History:  reports that he has never smoked. He has never used smokeless tobacco. He reports previous alcohol use. He reports that he does not use drugs.  Allergies:  Allergies  Allergen Reactions  . Ciprofloxacin Anaphylaxis  . Keppra [Levetiracetam] Other (See Comments)    Makes the patient very agitated and "not like himself"  . Avocado  Nausea And Vomiting  . Metformin And Related Other (See Comments)    Renal issue    Medications:  Prior to Admission:  Medications Prior to Admission  Medication Sig Dispense Refill Last Dose  . amLODipine (NORVASC) 10 MG tablet Take 1 tablet (10 mg total) by mouth daily. 30 tablet 0 11/20/2019 at Unknown time  . ascorbic acid (VITAMIN C) 500 MG tablet Take 1 tablet (500 mg total) by mouth daily as needed (if cold-like symptoms are present). (Patient taking differently: Take 500 mg by mouth daily. ) 30 tablet 0 11/20/2019 at Unknown time  . aspirin EC 325 MG tablet Take 325 mg by mouth every morning.   11/20/2019 at Unknown time  . atorvastatin (LIPITOR) 40 MG tablet Take 1 tablet (40 mg total) by mouth daily at 6 PM. (Patient taking differently: Take 40 mg by mouth at bedtime. ) 10 tablet 0 11/19/2019 at Unknown time  . baclofen (LIORESAL) 10 MG tablet Take 2 tablets (20 mg total) by mouth 2 (two) times daily. (Patient taking differently: Take 20 mg by mouth in the morning and at bedtime. ) 120 each 0 11/20/2019 at Unknown time  . bisacodyl (DULCOLAX) 10 MG suppository Place 1 suppository (10 mg total) rectally 2 (two) times daily. 6 suppository 0 11/20/2019 at Unknown time  . carvedilol (COREG) 12.5 MG tablet Take 1 tablet (12.5 mg total) by mouth 2 (two) times daily with a meal. (Patient taking differently: Take 12.5 mg by mouth in the morning and at bedtime. ) 60  tablet 0 11/20/2019 at 0800  . Cholecalciferol (VITAMIN D3 PO) Take 1 tablet by mouth daily.    11/20/2019 at Unknown time  . clopidogrel (PLAVIX) 75 MG tablet Take 1 tablet (75 mg total) by mouth daily. 30 tablet 0 11/20/2019 at Unknown time  . glipiZIDE (GLUCOTROL) 10 MG tablet Take 10 mg by mouth 2 (two) times daily with a meal.   11/20/2019 at Unknown time  . indapamide (LOZOL) 1.25 MG tablet Take 1 tablet (1.25 mg total) by mouth daily. 30 tablet 0 11/20/2019 at Unknown time  . insulin aspart (NOVOLOG) 100 UNIT/ML injection Inject 2-15  Units into the skin 4 (four) times daily -  before meals and at bedtime. SSI:  121-150 = 2 units; 151-200 = 3 units, 201-250 = 5 units, 251-300 = 8 units, 301-350 = 11 units, 351-300 = 15 units, greater than 400 = 15 units and call MD 20 mL 0 11/19/2019 at Unknown time  . insulin detemir (LEVEMIR) 100 UNIT/ML injection Inject 0.16 mLs (16 Units total) into the skin daily. If CBG >125 (per daughter) (Patient taking differently: Inject 16 Units into the skin daily as needed (CBG >125). ) 10 mL 0 11/19/2019 at Unknown time  . lamoTRIgine (LAMICTAL) 25 MG tablet Take 5 tablets (125 mg total) by mouth 2 (two) times daily. Five tablets (Patient taking differently: Take 100 mg by mouth in the morning and at bedtime. ) 300 tablet 0 11/20/2019 at Unknown time  . lisinopril (ZESTRIL) 20 MG tablet Take 1 tablet (20 mg total) by mouth 2 (two) times daily. (Patient taking differently: Take 20 mg by mouth See admin instructions. Take one tablet (20 mg) by mouth twice daily - 10am and 3pm) 60 tablet 0 11/20/2019 at Unknown time  . midodrine (PROAMATINE) 5 MG tablet Take 1 tablet (5 mg total) by mouth in the morning, at noon, and at bedtime. FOR ORTHOSTATIC HYPOTENSION 90 tablet 0 11/20/2019 at Unknown time  . Nystatin (GERHARDT'S BUTT CREAM) CREA Apply 1 application topically daily.   11/19/2019 at Unknown time  . senna-docusate (SENOKOT-S) 8.6-50 MG tablet Take 1 tablet by mouth daily. 30 tablet 0 11/19/2019 at Unknown time  . liver oil-zinc oxide (DESITIN) 40 % ointment Apply topically 2 (two) times daily as needed for irritation (for protection). (Patient not taking: Reported on 11/18/2019) 56.7 g 0 Not Taking at Unknown time  . pantoprazole (PROTONIX) 40 MG tablet Take 1 tablet (40 mg total) by mouth daily. (Patient not taking: Reported on 11/18/2019) 30 tablet 0 Not Taking at Unknown time  . polyethylene glycol (MIRALAX) 17 g packet Take 17 g by mouth 2 (two) times daily for 5 days. (Patient not taking: Reported on 11/20/2019)  14 each 0 Not Taking at Unknown time  . tamsulosin (FLOMAX) 0.4 MG CAPS capsule Take 1 capsule (0.4 mg total) by mouth daily after supper. (Patient not taking: Reported on 11/18/2019) 30 capsule 0 Not Taking at Unknown time  . Vitamin D3 (VITAMIN D) 25 MCG tablet Take 0.5 tablets (500 Units total) by mouth daily. (Patient not taking: Reported on 11/18/2019) 15 tablet 0 Not Taking at Unknown time    Results for orders placed or performed during the hospital encounter of 11/20/19 (from the past 48 hour(s))  CBC with Differential/Platelet     Status: Abnormal   Collection Time: 11/22/19 11:45 AM  Result Value Ref Range   WBC 6.5 4.0 - 10.5 K/uL   RBC 4.10 (L) 4.22 - 5.81 MIL/uL   Hemoglobin 12.4 (  L) 13.0 - 17.0 g/dL   HCT 26.8 (L) 39 - 52 %   MCV 94.1 80.0 - 100.0 fL   MCH 30.2 26.0 - 34.0 pg   MCHC 32.1 30.0 - 36.0 g/dL   RDW 34.1 96.2 - 22.9 %   Platelets 216 150 - 400 K/uL   nRBC 0.0 0.0 - 0.2 %   Neutrophils Relative % 70 %   Neutro Abs 4.6 1.7 - 7.7 K/uL   Lymphocytes Relative 19 %   Lymphs Abs 1.3 0.7 - 4.0 K/uL   Monocytes Relative 8 %   Monocytes Absolute 0.5 0 - 1 K/uL   Eosinophils Relative 2 %   Eosinophils Absolute 0.1 0 - 0 K/uL   Basophils Relative 0 %   Basophils Absolute 0.0 0 - 0 K/uL   Immature Granulocytes 1 %   Abs Immature Granulocytes 0.03 0.00 - 0.07 K/uL    Comment: Performed at [facility], [address], [place], [place] [address]  Basic metabolic panel     Status: Abnormal   Collection Time: 11/22/19 11:45 AM  Result Value Ref Range   Sodium 145 135 - 145 mmol/L   Potassium 3.8 3.5 - 5.1 mmol/L   Chloride 113 (H) 98 - 111 mmol/L   CO2 21 (L) 22 - 32 mmol/L   Glucose, Bld 180 (H) 70 - 99 mg/dL    Comment: Glucose reference range applies only to samples taken after fasting for at least 8 hours.   BUN 16 8 - 23 mg/dL   Creatinine, Ser 1.19 0.61 - 1.24 mg/dL   Calcium 9.3 8.9 - 41.7 mg/dL   GFR calc non Af Amer >60 >60 mL/min   GFR calc Af Amer  >60 >60 mL/min   Anion gap 11 5 - 15    Comment: Performed at [facility], [address], [place], [place] [address]  Glucose, capillary     Status: Abnormal   Collection Time: 11/22/19 11:52 AM  Result Value Ref Range   Glucose-Capillary 154 (H) 70 - 99 mg/dL    Comment: Glucose reference range applies only to samples taken after fasting for at least 8 hours.   Comment 1 Notify RN    Comment 2 Document in Chart   Glucose, capillary     Status: Abnormal   Collection Time: 11/22/19  3:56 PM  Result Value Ref Range   Glucose-Capillary 139 (H) 70 - 99 mg/dL    Comment: Glucose reference range applies only to samples taken after fasting for at least 8 hours.   Comment 1 Notify RN    Comment 2 Document in Chart   Glucose, capillary     Status: Abnormal   Collection Time: 11/22/19 11:45 PM  Result Value Ref Range   Glucose-Capillary 195 (H) 70 - 99 mg/dL    Comment: Glucose reference range applies only to samples taken after fasting for at least 8 hours.  CBC with Differential/Platelet     Status: None   Collection Time: 11/23/19  2:55 AM  Result Value Ref Range   WBC 5.8 4.0 - 10.5 K/uL   RBC 4.31 4.22 - 5.81 MIL/uL   Hemoglobin 13.1 13.0 - 17.0 g/dL   HCT 48.1 39 - 52 %   MCV 94.9 80.0 - 100.0 fL   MCH 30.4 26.0 - 34.0 pg   MCHC 32.0 30.0 - 36.0 g/dL   RDW 85.6 31.4 - 97.0 %   Platelets 193 150 - 400 K/uL  Comment: REPEATED TO VERIFY   nRBC 0.0 0.0 - 0.2 %   Neutrophils Relative % 62 %   Neutro Abs 3.6 1.7 - 7.7 K/uL   Lymphocytes Relative 24 %   Lymphs Abs 1.4 0.7 - 4.0 K/uL   Monocytes Relative 9 %   Monocytes Absolute 0.5 0 - 1 K/uL   Eosinophils Relative 4 %   Eosinophils Absolute 0.2 0 - 0 K/uL   Basophils Relative 1 %   Basophils Absolute 0.0 0 - 0 K/uL   Immature Granulocytes 0 %   Abs Immature Granulocytes 0.02 0.00 - 0.07 K/uL    Comment: Performed at [facility], [address], [place], [place] [address]  Basic metabolic panel     Status:  Abnormal   Collection Time: 11/23/19  2:55 AM  Result Value Ref Range   Sodium 143 135 - 145 mmol/L   Potassium 4.6 3.5 - 5.1 mmol/L   Chloride 112 (H) 98 - 111 mmol/L   CO2 22 22 - 32 mmol/L   Glucose, Bld 90 70 - 99 mg/dL    Comment: Glucose reference range applies only to samples taken after fasting for at least 8 hours.   BUN 10 8 - 23 mg/dL   Creatinine, Ser 6.04 0.61 - 1.24 mg/dL   Calcium 9.2 8.9 - 54.0 mg/dL   GFR calc non Af Amer >60 >60 mL/min   GFR calc Af Amer >60 >60 mL/min   Anion gap 9 5 - 15    Comment: Performed at [facility], [address], [place], [place] [address]  Glucose, capillary     Status: None   Collection Time: 11/23/19  3:08 AM  Result Value Ref Range   Glucose-Capillary 97 70 - 99 mg/dL    Comment: Glucose reference range applies only to samples taken after fasting for at least 8 hours.  Glucose, capillary     Status: Abnormal   Collection Time: 11/23/19  8:31 AM  Result Value Ref Range   Glucose-Capillary 148 (H) 70 - 99 mg/dL    Comment: Glucose reference range applies only to samples taken after fasting for at least 8 hours.  Glucose, capillary     Status: Abnormal   Collection Time: 11/23/19 12:17 PM  Result Value Ref Range   Glucose-Capillary 166 (H) 70 - 99 mg/dL    Comment: Glucose reference range applies only to samples taken after fasting for at least 8 hours.  Glucose, capillary     Status: Abnormal   Collection Time: 11/23/19  5:17 PM  Result Value Ref Range   Glucose-Capillary 232 (H) 70 - 99 mg/dL    Comment: Glucose reference range applies only to samples taken after fasting for at least 8 hours.  Glucose, capillary     Status: Abnormal   Collection Time: 11/23/19  8:11 PM  Result Value Ref Range   Glucose-Capillary 242 (H) 70 - 99 mg/dL    Comment: Glucose reference range applies only to samples taken after fasting for at least 8 hours.   Comment 1 Notify RN    Comment 2 Document in Chart   Glucose, capillary      Status: Abnormal   Collection Time: 11/23/19 11:06 PM  Result Value Ref Range   Glucose-Capillary 241 (H) 70 - 99 mg/dL    Comment: Glucose reference range applies only to samples taken after fasting for at least 8 hours.   Comment 1 Notify RN    Comment 2 Document  in Chart   CBC with Differential/Platelet     Status: Abnormal   Collection Time: 11/24/19  4:29 AM  Result Value Ref Range   WBC 6.5 4.0 - 10.5 K/uL   RBC 4.13 (L) 4.22 - 5.81 MIL/uL   Hemoglobin 12.3 (L) 13.0 - 17.0 g/dL   HCT 67.8 (L) 39 - 52 %   MCV 91.8 80.0 - 100.0 fL   MCH 29.8 26.0 - 34.0 pg   MCHC 32.5 30.0 - 36.0 g/dL   RDW 93.8 10.1 - 75.1 %   Platelets 215 150 - 400 K/uL   nRBC 0.0 0.0 - 0.2 %   Neutrophils Relative % 62 %   Neutro Abs 4.1 1.7 - 7.7 K/uL   Lymphocytes Relative 25 %   Lymphs Abs 1.6 0.7 - 4.0 K/uL   Monocytes Relative 8 %   Monocytes Absolute 0.5 0 - 1 K/uL   Eosinophils Relative 3 %   Eosinophils Absolute 0.2 0 - 0 K/uL   Basophils Relative 1 %   Basophils Absolute 0.0 0 - 0 K/uL   Immature Granulocytes 1 %   Abs Immature Granulocytes 0.03 0.00 - 0.07 K/uL    Comment: Performed at [facility], [address], [place], [place] [address]  Glucose, capillary     Status: Abnormal   Collection Time: 11/24/19  8:33 AM  Result Value Ref Range   Glucose-Capillary 160 (H) 70 - 99 mg/dL    Comment: Glucose reference range applies only to samples taken after fasting for at least 8 hours.    DG Shoulder Right  Result Date: 11/22/2019 CLINICAL DATA:  Shoulder pain. EXAM: RIGHT SHOULDER - 2+ VIEW COMPARISON:  Radiographs 07/15/2019. FINDINGS: AP and Y-views are submitted. The Y view is limited. The bones are demineralized. There is recurrent anterior glenohumeral dislocation without evidence of acute fracture. Underlying glenohumeral and acromioclavicular degenerative changes are present. IMPRESSION: Limited examination with evidence of recurrent anterior glenohumeral dislocation. No evidence  of acute fracture. Electronically Signed   By: Carey Bullocks M.D.   On: 11/22/2019 17:33   CT SHOULDER RIGHT WO CONTRAST  Result Date: 11/23/2019 CLINICAL DATA:  Shoulder fracture. EXAM: CT OF THE UPPER RIGHT EXTREMITY WITHOUT CONTRAST TECHNIQUE: Multidetector CT imaging of the right shoulder was performed according to the standard protocol. COMPARISON:  Radiographs 11/22/2019 and 07/15/2019. FINDINGS: Bones/Joint/Cartilage Anterior subluxation of the right glenohumeral joint without frank dislocation. There is resulting narrowing of the coracohumeral distance to 3 mm. There are underlying moderate glenohumeral degenerative changes with diffuse chondral thinning and osteophytes. No evidence of acute fracture. Mild degenerative changes are present at the acromioclavicular joint. No large shoulder joint effusion. Ligaments Suboptimally assessed by CT. Muscles and Tendons No focal rotator cuff muscular atrophy. There is no significant narrowing of the subacromial space or gross impingement on the supraspinatus or infraspinatus tendons. Due to the anterior glenohumeral subluxation, there is probable chronic encroachment on the subscapularis tendon. Soft tissues Unremarkable. IMPRESSION: 1. 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 Based on prior radiographs, this may be chronic or recurrent. 2. Underlying moderate glenohumeral degenerative changes. 3. No evidence of acute fracture. 4. Probable chronic encroachment on the subscapularis tendon due to the anterior glenohumeral subluxation and resulting subcoracoid impingement. Electronically Signed   By: Carey Bullocks M.D.   On: 11/23/2019 11:51   DG Swallowing Func-Speech Pathology  Result Date: 11/22/2019 Objective Swallowing Evaluation: Type of Study: MBS-Modified Barium Swallow Study  Patient Details Name: Douglas [name]  Mathis Fare. MRN: [id number] Date of Birth: 1950-11-26 Today's Date: 11/22/2019 Time: SLP Start Time (ACUTE  ONLY): 1310 -SLP Stop Time (ACUTE ONLY): 1326 SLP Time Calculation (min) (ACUTE ONLY): 16 min Past Medical History: Past Medical History: Diagnosis Date . Acute blood loss anemia  . Acute embolic stroke (HCC)  . Acute lower UTI  . AMS (altered mental status) 06/05/2019 . Cerebral edema (HCC) 04/04/2018 . Diabetes 1.5, managed as type 2 (HCC)  . Diabetes mellitus without complication (HCC)  . History of CVA with residual deficit  . History of Dysphagia, post-stroke  . Hypertension  . Ischemic stroke (HCC) 03/05/2019 . Labile blood glucose  . 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 Renal insufficiency 04/04/2018 . Stroke (HCC)  . UTI (urinary tract infection) 03/05/2019 Past Surgical History: Past Surgical History: Procedure Laterality Date . IR PATIENT EVAL TECH 0-60 MINS  03/30/2018 . LOOP RECORDER INSERTION N/A 03/08/2019  Procedure: LOOP RECORDER INSERTION;  Surgeon: Regan Lemming, MD;  Location: [facility] INVASIVE CV LAB;  Service: Cardiovascular;  Laterality: N/A; . TEE WITHOUT CARDIOVERSION N/A 04/02/2018  Procedure: TRANSESOPHAGEAL ECHOCARDIOGRAM (TEE);  Surgeon: [name], MD;  Location: [facility] ENDOSCOPY;  Service: Cardiovascular;  Laterality: N/A; HPI: Pt is a 69 y.o. 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 CT of the head and CXR were negative for acute changes. EEG:  eileptogenicity in left frontal-anterior temporal region as well as  cortical dysfunction in left hemisphere likely secondary to underlying infarct and can also be seen in post-ictal state. Pt has a hx of oropharyngeal dysphagia with most recent recommendations for Dysphagia 3 (soft) solids and honey-thick liquids following MBS on 06/18/19.  BSE 07/16/19: prolonged mastication with ground solids and soft solids and suspect prolonged AP transport and delayed  swallow initiation with all trials. An immediate cough was observed following thin liquid trials.  A dysphagia 2 diet with honey thick liquids was recommended at that time.  No data recorded Assessment / Plan / Recommendation CHL IP CLINICAL IMPRESSIONS 11/22/2019 Clinical Impression Pt presents with likely acute on chronic oropharyngeal dysphagia characterized by prolonged mastication, impaired bolus cohesion, a pharyngeal delay, and reduced lingual retraction. He demonstrated lingual residue, premature spillage to the valleculae with solids and to the pyriform sinuses with liquids, and aspiration (PAS 7) with thin liquids via cup and with nectar thick liquids via straw. He was unable to demonstrate compensatory strategies despite cues due to his difficulty following commands. SLP will follow pt to ensure diet tolerance. Dysphagia treatment is recommended; however, the extent to which the pt will be able to participate is questioned due to aphasia.  SLP Visit Diagnosis Dysphagia, oropharyngeal phase (R13.12) Attention and concentration deficit following -- Frontal lobe and executive function deficit following -- Impact on safety and function Mild aspiration risk   CHL IP TREATMENT RECOMMENDATION 11/22/2019 Treatment Recommendations Therapy as outlined in treatment plan below   Prognosis 11/22/2019 Prognosis for Safe Diet Advancement Fair Barriers to Reach Goals Language deficits Barriers/Prognosis Comment -- CHL IP DIET RECOMMENDATION 11/22/2019 SLP Diet Recommendations Dysphagia 2 (Fine chop) solids;Nectar thick liquid Liquid Administration via Cup;No straw Medication Administration Whole meds with puree Compensations Small sips/bites;Slow rate Postural Changes Seated upright at 90 degrees   CHL IP OTHER RECOMMENDATIONS 11/22/2019 Recommended Consults -- Oral Care Recommendations Oral care BID Other Recommendations Order thickener from pharmacy   [facility]  IP FOLLOW UP RECOMMENDATIONS 11/22/2019 Follow up Recommendations  Outpatient SLP;Home health SLP   CHL IP FREQUENCY AND DURATION 11/22/2019 Speech Therapy Frequency (ACUTE ONLY) min 2x/week Treatment Duration 2 weeks      CHL IP ORAL PHASE 11/22/2019 Oral Phase Impaired Oral - Pudding Teaspoon -- Oral - Pudding Cup -- Oral - Honey Teaspoon -- Oral - Honey Cup -- Oral - Nectar Teaspoon -- Oral - Nectar Cup Decreased bolus cohesion;Premature spillage;Lingual/palatal residue Oral - Nectar Straw Decreased bolus cohesion;Premature spillage;Lingual/palatal residue Oral - Thin Teaspoon -- Oral - Thin Cup Decreased bolus cohesion;Premature spillage;Lingual/palatal residue Oral - Thin Straw -- Oral - Puree Decreased bolus cohesion;Premature spillage;Lingual/palatal residue Oral - Mech Soft Decreased bolus cohesion;Premature spillage;Lingual/palatal residue;Impaired mastication Oral - Regular Decreased bolus cohesion;Premature spillage;Lingual/palatal residue;Impaired mastication Oral - Multi-Consistency -- Oral - Pill -- Oral Phase - Comment --  CHL IP PHARYNGEAL PHASE 11/22/2019 Pharyngeal Phase Impaired Pharyngeal- Pudding Teaspoon -- Pharyngeal -- Pharyngeal- Pudding Cup -- Pharyngeal -- Pharyngeal- Honey Teaspoon -- Pharyngeal -- Pharyngeal- Honey Cup -- Pharyngeal -- Pharyngeal- Nectar Teaspoon -- Pharyngeal -- Pharyngeal- Nectar Cup Delayed swallow initiation-pyriform sinuses Pharyngeal -- Pharyngeal- Nectar Straw Delayed swallow initiation-pyriform sinuses;Penetration/Aspiration during swallow Pharyngeal Material enters airway, passes BELOW cords and not ejected out despite cough attempt by patient Pharyngeal- Thin Teaspoon -- Pharyngeal -- Pharyngeal- Thin Cup -- Pharyngeal -- Pharyngeal- Thin Straw -- Pharyngeal -- Pharyngeal- Puree Delayed swallow initiation-vallecula;Reduced tongue base retraction Pharyngeal -- Pharyngeal- Mechanical Soft Delayed swallow initiation-vallecula;Delayed swallow initiation-pyriform sinuses;Reduced tongue base retraction Pharyngeal -- Pharyngeal- Regular  Delayed swallow initiation-vallecula;Reduced tongue base retraction Pharyngeal -- Pharyngeal- Multi-consistency -- Pharyngeal -- Pharyngeal- Pill Delayed swallow initiation-vallecula;Reduced tongue base retraction Pharyngeal -- Pharyngeal Comment --  CHL IP CERVICAL ESOPHAGEAL PHASE 11/22/2019 Cervical Esophageal Phase WFL Pudding Teaspoon -- Pudding Cup -- Honey Teaspoon -- Honey Cup -- Nectar Teaspoon -- Nectar Cup -- Nectar Straw -- Thin Teaspoon -- Thin Cup -- Thin Straw -- Puree -- Mechanical Soft -- Regular -- Multi-consistency -- Pill -- Cervical Esophageal Comment -- Shanika I. Vear ClockPhillips, MS, CCC-SLP Acute Rehabilitation Services Office number [phone] Pager [phone] Scheryl MartenShanika I Phillips 11/22/2019, 2:00 PM               ROS noncontributory, as above (extensive) Blood pressure (!) 158/87, pulse 93, temperature 98.6 F (37 C), temperature source Oral, resp. rate 20, height 6' (1.829 m), weight 78 kg, SpO2 100 %. 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 Diagnosis has been confirmed by CT. No role for intervention or for further evaluation. I have discussed this with his daughter who was reassured. Will sign off.  Myrene GalasMichael Vilda Zollner, MD [facility], [facility] [phone]  11/24/2019  10:24 AM

## 2019-11-24 NOTE — [place] FL2 (Signed)
[place] MEDICAID FL2 LEVEL OF CARE SCREENING TOOL     IDENTIFICATION  Patient Name: Douglas [name]. Birthdate: 1951-02-07 Sex: male Admission Date (Current Location): 11/20/2019  [facility] and [place] Number:  Producer, television/film/video and Address:  [facility] [place], [address] [place], [place] [address]      Provider Number: [id number]  Attending Physician Name and Address:  Hughie Closs, MD  Relative Name and Phone Number:  Aryeh Butterfield    Current Level of Care: Hospital Recommended Level of Care: Skilled Nursing Facility Prior Approval Number:    Date Approved/Denied:   PASRR Number: [id number]  Discharge Plan: SNF    Current Diagnoses: Patient Active Problem List   Diagnosis Date Noted  . Seizure (HCC) 11/20/2019  . Seizures (HCC) 11/20/2019  . Encephalopathy 11/19/2019  . Delayed orthostatic hypotension 08/04/2019  . Seizure disorder (HCC) 07/21/2019  . 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 Shoulder subluxation, right, sequela 07/16/2019  . Altered mental status 07/15/2019  . 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 Late effects of CVA (cerebrovascular accident)   . Pressure injury of skin 06/08/2019  . History of loop recorder 03/08/2019  . UTI (urinary tract infection) 03/05/2019  . 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 Global aphasia   . Type 2 diabetes mellitus with peripheral neuropathy (HCC)   . Hypertension associated with diabetes (HCC)   . Hyperlipidemia associated with type 2 diabetes mellitus (HCC)   . History of Dysphagia, post-stroke     Orientation RESPIRATION BLADDER Height & Weight     Self  Normal Incontinent, External catheter Weight: 171 lb 15.3 oz (78 kg) Height:  6' (182.9 cm)  BEHAVIORAL SYMPTOMS/MOOD NEUROLOGICAL BOWEL NUTRITION STATUS      Continent Diet (See discharge summary)  AMBULATORY STATUS COMMUNICATION  OF NEEDS Skin   Extensive Assist Verbally Normal                       Personal Care Assistance Level of Assistance  Bathing, Dressing, Feeding Bathing Assistance: Maximum assistance Feeding assistance: Maximum assistance Dressing Assistance: Maximum assistance     Functional Limitations Info  Sight, Hearing, Speech Sight Info: Adequate Hearing Info: Adequate Speech Info: Adequate    SPECIAL CARE FACTORS FREQUENCY  PT (By licensed PT), OT (By licensed OT)     PT Frequency: 5x a week OT Frequency: 5x a week            Contractures Contractures Info: Not present    Additional Factors Info  Code Status, Allergies Code Status Info: FULL Allergies Info: Ciprofloxacin, Keppra (Levetiracetam), Avocado, Metformin And Related           Current Medications (11/24/2019):  This is the current hospital active medication list Current Facility-Administered Medications  Medication Dose Route Frequency Provider Last Rate Last Admin  . 0.9 %  sodium chloride infusion   Intravenous Continuous Hughie Closs, MD 75 mL/hr at 11/23/19 0728 New Bag at 11/23/19 3762  . amLODipine (NORVASC) tablet 10 mg  10 mg Oral Daily Hughie Closs, MD   10 mg at 11/23/19 1236  . aspirin EC tablet 325 mg  325 mg Oral q morning - 10a Hughie Closs, MD   325 mg at 11/23/19 1236  . atorvastatin (LIPITOR) tablet 40 mg  40 mg Oral QHS Hughie Closs, MD   40 mg at 11/23/19 2141  . baclofen (  LIORESAL) tablet 20 mg  20 mg Oral BID Hughie Closs, MD   20 mg at 11/23/19 2142  . carvedilol (COREG) tablet 12.5 mg  12.5 mg Oral BID WC Hughie Closs, MD   12.5 mg at 11/23/19 1638  . clopidogrel (PLAVIX) tablet 75 mg  75 mg Oral Daily Pahwani, Daleen Bo, MD   75 mg at 11/23/19 1236  . hydrALAZINE (APRESOLINE) injection 10 mg  10 mg Intravenous Q6H PRN Pahwani, Daleen Bo, MD      . insulin aspart (novoLOG) injection 0-9 Units  0-9 Units Subcutaneous Q4H Eduard Clos, MD   3 Units at 11/23/19 2307  . insulin detemir  (LEVEMIR) injection 20 Units  20 Units Subcutaneous Daily Pahwani, Ravi, MD      . lacosamide (VIMPAT) tablet 100 mg  100 mg Oral BID Hughie Closs, MD   100 mg at 11/23/19 2141  . lamoTRIgine (LAMICTAL) tablet 150 mg  150 mg Oral BID Hughie Closs, MD   150 mg at 11/23/19 2141  . lisinopril (ZESTRIL) tablet 20 mg  20 mg Oral 2 times per day Hughie Closs, MD   20 mg at 11/23/19 1638  . ondansetron (ZOFRAN) tablet 4 mg  4 mg Oral Q6H PRN Eduard Clos, MD       Or  . ondansetron [facility]) injection 4 mg  4 mg Intravenous Q6H PRN Eduard Clos, MD      . Resource ThickenUp Clear   Oral PRN Eduard Clos, MD      . tamsulosin [facility]) capsule 0.4 mg  0.4 mg Oral QPC supper Hughie Closs, MD   0.4 mg at 11/23/19 1700     Discharge Medications: Please see discharge summary for a list of discharge medications.  Relevant Imaging Results:  Relevant Lab Results:   Additional Information SSN [id number]  Dannette Barbara St[place], [place]

## 2019-11-25 LAB — CBC WITH DIFFERENTIAL/PLATELET
Abs Immature Granulocytes: 0.02 10*3/uL (ref 0.00–0.07)
Basophils Absolute: 0 10*3/uL (ref 0.0–0.1)
Basophils Relative: 1 %
Eosinophils Absolute: 0.3 10*3/uL (ref 0.0–0.5)
Eosinophils Relative: 5 %
HCT: 34 % — ABNORMAL LOW (ref 39.0–52.0)
Hemoglobin: 11.1 g/dL — ABNORMAL LOW (ref 13.0–17.0)
Immature Granulocytes: 0 %
Lymphocytes Relative: 25 %
Lymphs Abs: 1.4 10*3/uL (ref 0.7–4.0)
MCH: 30.2 pg (ref 26.0–34.0)
MCHC: 32.6 g/dL (ref 30.0–36.0)
MCV: 92.4 fL (ref 80.0–100.0)
Monocytes Absolute: 0.4 10*3/uL (ref 0.1–1.0)
Monocytes Relative: 8 %
Neutro Abs: 3.4 10*3/uL (ref 1.7–7.7)
Neutrophils Relative %: 61 %
Platelets: 195 10*3/uL (ref 150–400)
RBC: 3.68 MIL/uL — ABNORMAL LOW (ref 4.22–5.81)
RDW: 13.1 % (ref 11.5–15.5)
WBC: 5.5 10*3/uL (ref 4.0–10.5)
nRBC: 0 % (ref 0.0–0.2)

## 2019-11-25 LAB — GLUCOSE, CAPILLARY
Glucose-Capillary: 124 mg/dL — ABNORMAL HIGH (ref 70–99)
Glucose-Capillary: 132 mg/dL — ABNORMAL HIGH (ref 70–99)
Glucose-Capillary: 164 mg/dL — ABNORMAL HIGH (ref 70–99)
Glucose-Capillary: 178 mg/dL — ABNORMAL HIGH (ref 70–99)
Glucose-Capillary: 184 mg/dL — ABNORMAL HIGH (ref 70–99)
Glucose-Capillary: 200 mg/dL — ABNORMAL HIGH (ref 70–99)
Glucose-Capillary: 248 mg/dL — ABNORMAL HIGH (ref 70–99)
Glucose-Capillary: 76 mg/dL (ref 70–99)

## 2019-11-25 LAB — SARS CORONAVIRUS 2 BY RT PCR (HOSPITAL ORDER, PERFORMED IN ~~LOC~~ HOSPITAL LAB): SARS Coronavirus 2: NEGATIVE

## 2019-11-25 MED ORDER — BACLOFEN 10 MG PO TABS: 20.0000 mg | ORAL_TABLET | Freq: Two times a day (BID) | ORAL | 0 refills | Status: DC

## 2019-11-25 NOTE — Progress Notes (Signed)
  Speech Language Pathology Treatment: Dysphagia  Patient Details Name: Douglas [name]. MRN: [id number] DOB: 10/01/1950 Today's Date: 11/25/2019 Time: 9798-9211 SLP Time Calculation (min) (ACUTE ONLY): 22 min  Assessment / Plan / Recommendation Clinical Impression  Pt needs hand over hand assist to attempt to follow directions including taking smaller boluses, With large boluses, he presents with minimal cough x3 of approx 10 boluses - raising concern for mild aspiration.  Due to motor planning difficulty, pt unable to cough on command thus strict aspiration precautions are indicated.  Pt attempts communication by pointing to items, he is not nodding head yes/no at this time.  He also demonstrates stereotypical verbalization "yes" to nearly all of SLP communication.    Intake of breakfast was 100% in addition to an extra juice - thus if intake continues at this level, nutrition concerns will not be present.  No oral pocketing noted after pt brushed his teeth per SLP cues.  Provided pt with oral suction to use after dental care, which he used with delay independently but was resistant to SLP attempt to help hand over hand.  Continue dys2/nectar diet with full supervision.    He will continue to benefit from 24/7 supervision and SLP follow up for language/dysphagia.    HPI HPI: Pt is a 69 y.o. 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 CT of the head and CXR were negative for acute changes. EEG:  eileptogenicity in left frontal-anterior temporal region as well as  cortical dysfunction in left hemisphere likely secondary to underlying infarct and can also be seen in post-ictal state. Pt has a hx of oropharyngeal dysphagia with most recent recommendations for Dysphagia 3 (soft) solids and honey-thick liquids following MBS on  06/18/19.  BSE 07/16/19: prolonged mastication with ground solids and soft solids and suspect prolonged AP transport and delayed swallow initiation with all trials. An immediate cough was observed following thin liquid trials.  A dysphagia 2 diet with honey thick liquids was recommended at that time.       SLP Plan  Continue with current plan of care       Recommendations  Diet recommendations: Dysphagia 2 (fine chop);Nectar-thick liquid Liquids provided via: Cup;No straw Medication Administration: Whole meds with puree Supervision: Full supervision/cueing for compensatory strategies;Patient able to self feed Compensations: Small sips/bites;Slow rate;Lingual sweep for clearance of pocketing Postural Changes and/or Swallow Maneuvers: Seated upright 90 degrees;Upright 30-60 min after meal                Oral Care Recommendations: Oral care BID Follow up Recommendations: Outpatient SLP;Home health SLP SLP Visit Diagnosis: Dysphagia, oropharyngeal phase (R13.12) Plan: Continue with current plan of care       GO                [name] 11/25/2019, 9:38 AM   Rolena Infante, MS [facility] SLP Acute Rehab Services Office [phone]

## 2019-11-25 NOTE — Progress Notes (Signed)
PROGRESS NOTE    Douglas [name].  WPY:[id number] DOB: January 25, 1951 DOA: 11/20/2019 PCP: Ralene Ok, MD   Brief Narrative:  Douglas [name]. is a 69 y.o. male with history of right-sided hemiplegia from previous stroke, hypertension, diabetes mellitus type 2 was noticed to have staring spells as per the patient's daughter.  The first episode was around 48 hours ago in the evening which lasted for few seconds and then got corrected.  The next one was noticed the morning of presentation when patient woke up.  He was then brought to the ER at that point.  In the ER CT head was unremarkable.  There was some concern for status epilepticus and neurologist on-call was consulted patient was given increased dose of Lamictal and 1 loading dose of Vimpat. Marland Kitchen cEEG showed epileptiform activity in left frontal anterior temporal region as well as cortical dysfunction in the left hemisphere.  Due to having dysphagia, he was continued on IV Vimpat.  Once he passed swallow evaluation.  He was started on increased dose of Lamictal from 125 to 150 mg twice daily and was overlapped with Vimpat 2 doses per neuro recommendations.  His daughter raised concern about possible issue with right shoulder.  Patient has right hemiplegia.  Patient did not have any pain.  No tenderness.  X-ray was done which showed dislocation.  Per family's request, Ortho was consulted.  They opined that its pseudosubluxation and does not need any treatment.  They also discussed this in length with daughter.  He was seen by PT OT and they recommended SNF.  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  Patient was loaded  with Vimpat 200 mg IV and currently on 100 mg IV twice daily. cEEG shows epileptiform activity in left frontal anterior temporal region as well as cortical dysfunction in the left hemisphere.  Patient was on IV Vimpat since he was failing swallow evaluation.  Now status post modified barium swallow and per speech therapist/he can have a dysphagia 2 diet.  He can take all p.o. meds.  I had a long discussion with Dr. Derry Lory of neurology who recommends increasing Lamictal to 150 mg twice daily and continuing Vimpat 100 mg p.o. twice daily until 11/23/2019.  We will discontinue Vimpat today.  Patient will continue on Lamictal.  Patient alert at his baseline.  Essential hypertension: Blood pressure controlled.  Continue home medications.  Dysphagia: Now cleared to have dysphagia 2 diet by SLP.  Hyperlipidemia: Continue atorvastatin   COPD: Stable.  Continue home inhalers.  Type 2 diabetes mellitus: Takes glipizide and 16 units of Lantus and sliding scale as well at home.  Blood sugar slightly elevated now so I will increase his Lantus to 20 units and continue SSI.  History of stroke with right-sided hemiplegia: Stable.  Chronic kidney disease stage IIIa: At baseline.  Chronic diastolic congestive heart failure: Appears euvolemic.  Echo done in November 2020 shows grade 1 DD, will continue gentle hydration as I am unsure how much p.o. he is going to take.  Right shoulder dislocation/Pseudosubluxation: S/p x-ray and CT scan.  Seen by orthopedics.  Per them, no role for intervention or further evaluation.  They also discussed with daughter and reassured her.  DVT prophylaxis:    Code Status: Full Code  Family Communication: None present at bedside.  Called and spoke to his daughter Cala Bradford Gaede on 11/23/2019.  No new updates since then.  Status is: Inpatient  Remains inpatient appropriate because: Unsafe DC plan   Dispo: The patient is from: Home              Anticipated d/c is to: SNF,  waiting for bed placement per TOC.              Anticipated d/c date is: 11/26/2019              Patient currently is medically stable to d/c.    Estimated body mass index is 23.32 kg/m as calculated from the following:   Height as of this encounter: 6' (1.829 m).   Weight as of this encounter: 78 kg.  Pressure Injury 06/08/19 Buttocks Right;Left;Upper Stage 2 -  Partial thickness loss of dermis presenting as a shallow open injury with a red, pink wound bed without slough. (Active)  06/08/19 0645  Location: Buttocks  Location Orientation: Right;Left;Upper  Staging: Stage 2 -  Partial thickness loss of dermis presenting as a shallow open injury with a red, pink wound bed without slough.  Wound Description (Comments):   Present on Admission:      Nutritional status:               Consultants:   Neurology  Procedures:   None  Antimicrobials:  Anti-infectives (From admission, onward)   None         Subjective: Seen and examined.  Patient alert and has aphasia.  He is at baseline.  Objective: Vitals:   11/24/19 1939 11/24/19 2346 11/25/19 0353 11/25/19 0810  BP: (!) 143/70 (!) 131/70 (!) 137/71 (!) 156/83  Pulse: 78 71 65 71  Resp: 20 18 18 18   Temp: 99.1 F (37.3 C) 98.4 F (36.9 C) 99.4 F (37.4 C) 98 F (36.7 C)  TempSrc: Oral Oral Oral Oral  SpO2: 91% 100% 100% 100%  Weight:      Height:        Intake/Output Summary (Last 24 hours) at 11/25/2019 1301 Last data filed at 11/25/2019 11/27/2019 Gross per 24 hour  Intake 120 ml  Output 1550 ml  Net -1430 ml   Filed Weights   11/20/19 1332  Weight: 78 kg    Examination:  General exam: Appears calm and comfortable  Respiratory system: Clear to auscultation. Respiratory effort normal. Cardiovascular system: S1 & S2 heard, RRR. No JVD, murmurs, rubs, gallops or clicks. No pedal edema. Gastrointestinal system: Abdomen is nondistended, soft and nontender. No organomegaly or masses felt. Normal bowel  sounds heard. Central nervous system: Aphasic.  Alert.  Unable to assess orientation.  Right hemiplegia  Data Reviewed: I have personally reviewed following labs and imaging studies  CBC: Recent Labs  Lab 11/20/19 1802 11/21/19 0147 11/21/19 0326 11/22/19 1145 11/23/19 0255 11/24/19 0429 11/25/19 0302  WBC 5.5   < > 7.0 6.5 5.8 6.5 5.5  NEUTROABS 3.3  --   --  4.6 3.6 4.1 3.4  HGB 14.2   < > 13.0 12.4* 13.1 12.3* 11.1*  HCT 44.5   < > 41.2 38.6* 40.9 37.9* 34.0*  MCV 94.5   < > 93.8 94.1 94.9 91.8 92.4  PLT 219   < > 217 216 193 215 195   < > = values in this  interval not displayed.   Basic Metabolic Panel: Recent Labs  Lab 11/18/19 2153 11/18/19 2153 11/20/19 1802 11/21/19 0147 11/21/19 0326 11/22/19 1145 11/23/19 0255  NA 142  --  142 143  --  145 143  K 3.7  --  4.2 3.7  --  3.8 4.6  CL 106  --  109 109  --  113* 112*  CO2 27  --  22 25  --  21* 22  GLUCOSE 89  --  121* 121*  --  180* 90  BUN 16  --  32* 24*  --  16 10  CREATININE 1.02   < > 1.42* 1.22 1.31* 1.11 0.90  CALCIUM 9.6  --  9.8 9.5  --  9.3 9.2  MG  --   --   --  2.0  --   --   --    < > = values in this interval not displayed.   GFR: Estimated Creatinine Clearance: 85 mL/min (by C-G formula based on SCr of 0.9 mg/dL). Liver Function Tests: Recent Labs  Lab 11/18/19 2153 11/20/19 1802 11/21/19 0147  AST 20 21 15   ALT 28 25 23   ALKPHOS 95 89 82  BILITOT 0.7 1.1 1.0  PROT 7.2 7.1 6.8  ALBUMIN 4.0 4.2 3.9   Recent Labs  Lab 11/18/19 2153  LIPASE 26   No results for input(s): AMMONIA in the last 168 hours. Coagulation Profile: No results for input(s): INR, PROTIME in the last 168 hours. Cardiac Enzymes: No results for input(s): CKTOTAL, CKMB, CKMBINDEX, TROPONINI in the last 168 hours. BNP (last 3 results) No results for input(s): PROBNP in the last 8760 hours. HbA1C: No results for input(s): HGBA1C in the last 72 hours. CBG: Recent Labs  Lab 11/24/19 1943 11/24/19 2346  11/25/19 0355 11/25/19 0810 11/25/19 1131  GLUCAP 167* 193* 132* 124* 200*   Lipid Profile: No results for input(s): CHOL, HDL, LDLCALC, TRIG, CHOLHDL, LDLDIRECT in the last 72 hours. Thyroid Function Tests: No results for input(s): TSH, T4TOTAL, FREET4, T3FREE, THYROIDAB in the last 72 hours. Anemia Panel: No results for input(s): VITAMINB12, FOLATE, FERRITIN, TIBC, IRON, RETICCTPCT in the last 72 hours. Sepsis Labs: No results for input(s): PROCALCITON, LATICACIDVEN in the last 168 hours.  Recent Results (from the past 240 hour(s))  SARS Coronavirus 2 by RT PCR (hospital order, performed in [facility] lab) Nasopharyngeal Nasopharyngeal Swab     Status: None   Collection Time: 11/20/19  6:18 PM   Specimen: Nasopharyngeal Swab  Result Value Ref Range Status   SARS Coronavirus 2 NEGATIVE NEGATIVE Final    Comment: (NOTE) SARS-CoV-2 target nucleic acids are NOT DETECTED.  The SARS-CoV-2 RNA is generally detectable in upper and lower respiratory specimens during the acute phase of infection. The lowest concentration of SARS-CoV-2 viral copies this assay can detect is 250 copies / mL. A negative result does not preclude SARS-CoV-2 infection and should not be used as the sole basis for treatment or other patient management decisions.  A negative result may occur with improper specimen collection / handling, submission of specimen other than nasopharyngeal swab, presence of viral mutation(s) within the areas targeted by this assay, and inadequate number of viral copies (<250 copies / mL). A negative result must be combined with clinical observations, patient history, and epidemiological information.  Fact Sheet for Patients:   BoilerBrush.com.cyhttps://www.fda.gov/media/136312/download  Fact Sheet for Healthcare Providers: https://pope.com/https://www.fda.gov/media/136313/download  This test is not yet approved or  cleared by the [place] FDA  and has been authorized for detection and/or diagnosis of  SARS-CoV-2 by FDA under an Emergency Use Authorization (EUA).  This EUA will remain in effect (meaning this test can be used) for the duration of the COVID-19 declaration under Section 564(b)(1) of the Act, 21 U.S.C. section 360bbb-3(b)(1), unless the authorization is terminated or revoked sooner.  Performed at [facility], [address], [place], [place] [address]   Urine culture     Status: Abnormal   Collection Time: 11/20/19  6:37 PM   Specimen: Urine, Random  Result Value Ref Range Status   Specimen Description URINE, RANDOM  Final   Special Requests NONE  Final   Culture (A)  Final    <10,000 COLONIES/mL INSIGNIFICANT GROWTH Performed at [facility], [address], [place], [place] [address]    Report Status 11/21/2019 FINAL  Final  SARS Coronavirus 2 by RT PCR (hospital order, performed in [facility] hospital lab) Nasopharyngeal Nasopharyngeal Swab     Status: None   Collection Time: 11/25/19  8:30 AM   Specimen: Nasopharyngeal Swab  Result Value Ref Range Status   SARS Coronavirus 2 NEGATIVE NEGATIVE Final    Comment: (NOTE) SARS-CoV-2 target nucleic acids are NOT DETECTED.  The SARS-CoV-2 RNA is generally detectable in upper and lower respiratory specimens during the acute phase of infection. The lowest concentration of SARS-CoV-2 viral copies this assay can detect is 250 copies / mL. A negative result does not preclude SARS-CoV-2 infection and should not be used as the sole basis for treatment or other patient management decisions.  A negative result may occur with improper specimen collection / handling, submission of specimen other than nasopharyngeal swab, presence of viral mutation(s) within the areas targeted by this assay, and inadequate number of viral copies (<250 copies / mL). A negative result must be combined with clinical observations, patient history, and epidemiological information.  Fact Sheet for Patients:     BoilerBrush.com.cy  Fact Sheet for Healthcare Providers: https://pope.com/  This test is not yet approved or  cleared by the [place] FDA and has been authorized for detection and/or diagnosis of SARS-CoV-2 by FDA under an Emergency Use Authorization (EUA).  This EUA will remain in effect (meaning this test can be used) for the duration of the COVID-19 declaration under Section 564(b)(1) of the Act, 21 U.S.C. section 360bbb-3(b)(1), unless the authorization is terminated or revoked sooner.  Performed at [facility], [address] [place], [place] [address]       Radiology Studies: No results found.  Scheduled Meds: . amLODipine  10 mg Oral Daily  . aspirin EC  325 mg Oral q morning - 10a  . atorvastatin  40 mg Oral QHS  . baclofen  20 mg Oral BID  . carvedilol  12.5 mg Oral BID WC  . clopidogrel  75 mg Oral Daily  . insulin aspart  0-9 Units Subcutaneous Q4H  . insulin detemir  20 Units Subcutaneous Daily  . lamoTRIgine  150 mg Oral BID  . lisinopril  20 mg Oral 2 times per day  . tamsulosin  0.4 mg Oral QPC supper   Continuous Infusions: . sodium chloride 75 mL/hr at 11/23/19 0728     LOS: 5 days   Time spent: 26 minutes  Hughie Closs, MD [facility]  11/25/2019, 1:01 PM   To contact the attending provider between 7A-7P or the covering provider during after hours 7P-7A, please log into the web site [email or web address].

## 2019-11-25 NOTE — Plan of Care (Signed)
  Problem: Clinical Measurements: Goal: Will remain free from infection Outcome: Progressing Goal: Diagnostic test results will improve Outcome: Progressing   Problem: Activity: Goal: Risk for activity intolerance will decrease Outcome: Progressing   Problem: Nutrition: Goal: Adequate nutrition will be maintained Outcome: Progressing   

## 2019-11-26 LAB — CBC WITH DIFFERENTIAL/PLATELET
Abs Immature Granulocytes: 0.05 10*3/uL (ref 0.00–0.07)
Basophils Absolute: 0 10*3/uL (ref 0.0–0.1)
Basophils Relative: 0 %
Eosinophils Absolute: 0.4 10*3/uL (ref 0.0–0.5)
Eosinophils Relative: 6 %
HCT: 38.2 % — ABNORMAL LOW (ref 39.0–52.0)
Hemoglobin: 12.7 g/dL — ABNORMAL LOW (ref 13.0–17.0)
Immature Granulocytes: 1 %
Lymphocytes Relative: 20 %
Lymphs Abs: 1.4 10*3/uL (ref 0.7–4.0)
MCH: 30.2 pg (ref 26.0–34.0)
MCHC: 33.2 g/dL (ref 30.0–36.0)
MCV: 91 fL (ref 80.0–100.0)
Monocytes Absolute: 0.5 10*3/uL (ref 0.1–1.0)
Monocytes Relative: 7 %
Neutro Abs: 4.4 10*3/uL (ref 1.7–7.7)
Neutrophils Relative %: 66 %
Platelets: 239 10*3/uL (ref 150–400)
RBC: 4.2 MIL/uL — ABNORMAL LOW (ref 4.22–5.81)
RDW: 12.9 % (ref 11.5–15.5)
WBC: 6.7 10*3/uL (ref 4.0–10.5)
nRBC: 0 % (ref 0.0–0.2)

## 2019-11-26 LAB — GLUCOSE, CAPILLARY
Glucose-Capillary: 95 mg/dL (ref 70–99)
Glucose-Capillary: 128 mg/dL — ABNORMAL HIGH (ref 70–99)
Glucose-Capillary: 164 mg/dL — ABNORMAL HIGH (ref 70–99)
Glucose-Capillary: 181 mg/dL — ABNORMAL HIGH (ref 70–99)
Glucose-Capillary: 171 mg/dL — ABNORMAL HIGH (ref 70–99)

## 2019-11-26 NOTE — Progress Notes (Signed)
PROGRESS NOTE    Douglas CordiaWilliam L Bondarenko Jr.  ZOX:[id number] DOB: [birth year] DOA: 11/20/2019 PCP: Ralene OkMoreira, Roy, MD   Brief Narrative:  [name] is a 69 y.o. male with history of right-sided hemiplegia from previous stroke, hypertension, diabetes mellitus type 2 was noticed to have staring spells as per the patient's daughter.  The first episode was around 48 hours ago in the evening which lasted for few seconds and then got corrected.  The next one was noticed the morning of presentation when patient woke up.  He was then brought to the ER at that point.  In the ER CT head was unremarkable.  There was some concern for status epilepticus and neurologist on-call was consulted patient was given increased dose of Lamictal and 1 loading dose of Vimpat. Marland Kitchen. cEEG showed epileptiform activity in left frontal anterior temporal region as well as cortical dysfunction in the left hemisphere.  Due to having dysphagia, he was continued on IV Vimpat.  Once he passed swallow evaluation.  He was started on increased dose of Lamictal from 125 to 150 mg twice daily and was overlapped with Vimpat 2 doses per neuro recommendations.  His daughter raised concern about possible issue with right shoulder.  Patient has right hemiplegia.  Patient did not have any pain.  No tenderness.  X-ray was done which showed dislocation.  Per family's request, Ortho was consulted.  They opined that its pseudosubluxation and does not need any treatment.  They also discussed this in length with daughter.  He was seen by PT OT and they recommended SNF.  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  Patient was loaded  with Vimpat 200 mg IV and currently on 100 mg IV twice daily. cEEG shows epileptiform activity in left frontal anterior temporal region as well as cortical dysfunction in the left hemisphere.  Patient was on IV Vimpat since he was failing swallow evaluation.  Now status post modified barium swallow and per speech therapist/he can have a dysphagia 2 diet.  He can take all p.o. meds.  I had a long discussion with Dr. Derry LoryKhaliqdina of neurology who recommended increasing Lamictal to 150 mg twice daily and continuing Vimpat 100 mg p.o. twice daily until 11/23/2019.  Patient will continue on Lamictal.  Patient alert but nonverbal at his baseline.  Essential hypertension: Blood pressure controlled.  Continue home medications.  Dysphagia: Now cleared to have dysphagia 2 diet by SLP.  Hyperlipidemia: Continue atorvastatin   COPD: Stable.  Continue home inhalers.  Type 2 diabetes mellitus: Takes glipizide and 16 units of Lantus and sliding scale as well at home.  Blood sugar labile, was hypoglycemic this morning but slightly hyperglycemic now.  We will continue his Lantus 20 units and continue SSI.  History of stroke with right-sided hemiplegia: Stable.  Chronic kidney disease stage IIIa: At baseline.  Chronic diastolic congestive heart failure: Appears euvolemic.  Echo done in November 2020 shows grade 1 DD, will continue gentle hydration as I am unsure how much p.o. he is going to take.  Right shoulder dislocation/Pseudosubluxation: S/p x-ray and CT scan.  Seen by orthopedics.  Per them, no role for intervention or further  evaluation.  They also discussed with daughter and reassured her.  DVT prophylaxis:    Code Status: Full Code  Family Communication: None present at bedside.  Called and spoke to his daughter Cala Bradford Crume on 11/23/2019.  No new updates since then.  Status is: Inpatient  Remains inpatient appropriate because: Unsafe DC plan   Dispo: The patient is from: Home               Anticipated d/c is to: SNF, waiting for bed placement per TOC.              Anticipated d/c date is: 11/27/2019              Patient currently is medically stable to d/c.    Estimated body mass index is 23.32 kg/m as calculated from the following:   Height as of this encounter: 6' (1.829 m).   Weight as of this encounter: 78 kg.  Pressure Injury 06/08/19 Buttocks Right;Left;Upper Stage 2 -  Partial thickness loss of dermis presenting as a shallow open injury with a red, pink wound bed without slough. (Active)  06/08/19 0645  Location: Buttocks  Location Orientation: Right;Left;Upper  Staging: Stage 2 -  Partial thickness loss of dermis presenting as a shallow open injury with a red, pink wound bed without slough.  Wound Description (Comments):   Present on Admission:      Nutritional status:               Consultants:   Neurology  Procedures:   None  Antimicrobials:  Anti-infectives (From admission, onward)   None         Subjective: Patient seen and examined.  Alert and nonverbal at his baseline.  Objective: Vitals:   11/25/19 2350 11/26/19 0349 11/26/19 0802 11/26/19 1144  BP: (!) 151/76 (!) 135/70 128/77 (!) 132/76  Pulse: 80 87 77 74  Resp: 18 18 18 16   Temp: 98.3 F (36.8 C) 98 F (36.7 C) 98.1 F (36.7 C) 98.2 F (36.8 C)  TempSrc: Oral Oral Oral Oral  SpO2: 100% 99% 100% 100%  Weight:      Height:        Intake/Output Summary (Last 24 hours) at 11/26/2019 1410 Last data filed at 11/26/2019 1245 Gross per 24 hour  Intake 240 ml  Output 2150 ml  Net -1910 ml   Filed Weights   11/20/19 1332  Weight: 78 kg    Examination:  General exam: Appears calm and comfortable  Respiratory system: Clear to auscultation. Respiratory effort normal. Cardiovascular system: S1 & S2 heard, RRR. No JVD, murmurs, rubs, gallops or clicks. No pedal edema. Gastrointestinal system: Abdomen is nondistended, soft and nontender. No organomegaly or masses  felt. Normal bowel sounds heard. Central nervous system: Alert but nonverbal.  Unable to assess orientation.  Right hemiplegia. Extremities: Symmetric 5 x 5 power. Skin: No rashes, lesions or ulcers.   Data Reviewed: I have personally reviewed following labs and imaging studies  CBC: Recent Labs  Lab 11/22/19 1145 11/23/19 0255 11/24/19 0429 11/25/19 0302 11/26/19 0305  WBC 6.5 5.8 6.5 5.5 6.7  NEUTROABS 4.6 3.6 4.1 3.4 4.4  HGB 12.4* 13.1 12.3* 11.1* 12.7*  HCT 38.6* 40.9 37.9* 34.0* 38.2*  MCV 94.1 94.9 91.8 92.4 91.0  PLT 216 193 215 195 239   Basic Metabolic Panel: Recent Labs  Lab 11/20/19 1802 11/21/19 0147 11/21/19 0326 11/22/19 1145 11/23/19 0255  NA 142 143  --  145 143  K 4.2 3.7  --  3.8 4.6  CL 109 109  --  113* 112*  CO2 22 25  --  21* 22  GLUCOSE 121* 121*  --  180* 90  BUN 32* 24*  --  16 10  CREATININE 1.42* 1.22 1.31* 1.11 0.90  CALCIUM 9.8 9.5  --  9.3 9.2  MG  --  2.0  --   --   --    GFR: Estimated Creatinine Clearance: 85 mL/min (by C-G formula based on SCr of 0.9 mg/dL). Liver Function Tests: Recent Labs  Lab 11/20/19 1802 11/21/19 0147  AST 21 15  ALT 25 23  ALKPHOS 89 82  BILITOT 1.1 1.0  PROT 7.1 6.8  ALBUMIN 4.2 3.9   No results for input(s): LIPASE, AMYLASE in the last 168 hours. No results for input(s): AMMONIA in the last 168 hours. Coagulation Profile: No results for input(s): INR, PROTIME in the last 168 hours. Cardiac Enzymes: No results for input(s): CKTOTAL, CKMB, CKMBINDEX, TROPONINI in the last 168 hours. BNP (last 3 results) No results for input(s): PROBNP in the last 8760 hours. HbA1C: No results for input(s): HGBA1C in the last 72 hours. CBG: Recent Labs  Lab 11/25/19 1957 11/25/19 2349 11/26/19 0346 11/26/19 0809 11/26/19 1147  GLUCAP 248* 164* 95 128* 164*   Lipid Profile: No results for input(s): CHOL, HDL, LDLCALC, TRIG, CHOLHDL, LDLDIRECT in the last 72 hours. Thyroid Function Tests: No results  for input(s): TSH, T4TOTAL, FREET4, T3FREE, THYROIDAB in the last 72 hours. Anemia Panel: No results for input(s): VITAMINB12, FOLATE, FERRITIN, TIBC, IRON, RETICCTPCT in the last 72 hours. Sepsis Labs: No results for input(s): PROCALCITON, LATICACIDVEN in the last 168 hours.  Recent Results (from the past 240 hour(s))  SARS Coronavirus 2 by RT PCR (hospital order, performed in [facility] hospital lab) Nasopharyngeal Nasopharyngeal Swab     Status: None   Collection Time: 11/20/19  6:18 PM   Specimen: Nasopharyngeal Swab  Result Value Ref Range Status   SARS Coronavirus 2 NEGATIVE NEGATIVE Final    Comment: (NOTE) SARS-CoV-2 target nucleic acids are NOT DETECTED.  The SARS-CoV-2 RNA is generally detectable in upper and lower respiratory specimens during the acute phase of infection. The lowest concentration of SARS-CoV-2 viral copies this assay can detect is 250 copies / mL. A negative result does not preclude SARS-CoV-2 infection and should not be used as the sole basis for treatment or other patient management decisions.  A negative result may occur with improper specimen collection / handling, submission of specimen other than nasopharyngeal swab, presence of viral mutation(s) within the areas targeted by this assay, and inadequate number of viral copies (<250 copies / mL). A negative result must be combined with clinical observations, patient history, and epidemiological information.  Fact Sheet for Patients:   BoilerBrush.com.cy  Fact Sheet for Healthcare Providers: https://pope.com/  This test is not yet approved or  cleared by the [place] FDA and has been authorized for detection and/or diagnosis of SARS-CoV-2 by FDA under an Emergency Use Authorization (EUA).  This EUA will remain in effect (meaning this test can be used) for the duration of the COVID-19 declaration under Section 564(b)(1) of the Act, 21  U.S.C. section 360bbb-3(b)(1), unless the authorization is terminated or revoked sooner.  Performed at [facility], [address] [place], [place] [address]   Urine culture     Status: Abnormal   Collection Time: 11/20/19  6:37 PM   Specimen: Urine, Random  Result Value Ref Range Status  Specimen Description URINE, RANDOM  Final   Special Requests NONE  Final   Culture (A)  Final    <10,000 COLONIES/mL INSIGNIFICANT GROWTH Performed at [facility], [address] [place], [place] [address]    Report Status 11/21/2019 FINAL  Final  SARS Coronavirus 2 by RT PCR (hospital order, performed in [facility] hospital lab) Nasopharyngeal Nasopharyngeal Swab     Status: None   Collection Time: 11/25/19  8:30 AM   Specimen: Nasopharyngeal Swab  Result Value Ref Range Status   SARS Coronavirus 2 NEGATIVE NEGATIVE Final    Comment: (NOTE) SARS-CoV-2 target nucleic acids are NOT DETECTED.  The SARS-CoV-2 RNA is generally detectable in upper and lower respiratory specimens during the acute phase of infection. The lowest concentration of SARS-CoV-2 viral copies this assay can detect is 250 copies / mL. A negative result does not preclude SARS-CoV-2 infection and should not be used as the sole basis for treatment or other patient management decisions.  A negative result may occur with improper specimen collection / handling, submission of specimen other than nasopharyngeal swab, presence of viral mutation(s) within the areas targeted by this assay, and inadequate number of viral copies (<250 copies / mL). A negative result must be combined with clinical observations, patient history, and epidemiological information.  Fact Sheet for Patients:   BoilerBrush.com.cy  Fact Sheet for Healthcare Providers: https://pope.com/  This test is not yet approved or  cleared by the [place] FDA and has been authorized for detection  and/or diagnosis of SARS-CoV-2 by FDA under an Emergency Use Authorization (EUA).  This EUA will remain in effect (meaning this test can be used) for the duration of the COVID-19 declaration under Section 564(b)(1) of the Act, 21 U.S.C. section 360bbb-3(b)(1), unless the authorization is terminated or revoked sooner.  Performed at [facility], [address], [place], [place] [address]       Radiology Studies: No results found.  Scheduled Meds: . amLODipine  10 mg Oral Daily  . aspirin EC  325 mg Oral q morning - 10a  . atorvastatin  40 mg Oral QHS  . baclofen  20 mg Oral BID  . carvedilol  12.5 mg Oral BID WC  . clopidogrel  75 mg Oral Daily  . insulin aspart  0-9 Units Subcutaneous Q4H  . insulin detemir  20 Units Subcutaneous Daily  . lamoTRIgine  150 mg Oral BID  . lisinopril  20 mg Oral 2 times per day  . tamsulosin  0.4 mg Oral QPC supper   Continuous Infusions: . sodium chloride 75 mL/hr at 11/26/19 0916     LOS: 6 days   Time spent: 25 minutes  Hughie Closs, MD [facility]  11/26/2019, 2:10 PM   To contact the attending provider between 7A-7P or the covering provider during after hours 7P-7A, please log into the web site [email or web address].

## 2019-11-26 NOTE — TOC Progression Note (Signed)
Transition of Care (TOC) - Progression Note    Patient Details  Name: Douglas [name]. MRN: [id number] Date of Birth: Apr 29, 1951  Transition of Care [facility]) CM/SW Contact  Baldemar Lenis, [place] Phone Number: 11/26/2019, 2:43 PM  Clinical Narrative:   [facility] will have a quarantine bed for patient tomorrow. CSW spoke with patient's daughter and updated her about hopeful discharge tomorrow, pending medical stability. CSW explained need for patient to quarantine upon arrival and daughter indicated understanding. CSW to follow.    Expected Discharge Plan: Skilled Nursing Facility Barriers to Discharge: Continued Medical Work up  Expected Discharge Plan and Services Expected Discharge Plan: Skilled Nursing Facility       Living arrangements for the past 2 months: Single Family Home                                       Social Determinants of Health (SDOH) Interventions    Readmission Risk Interventions No flowsheet data found.

## 2019-11-26 NOTE — Progress Notes (Signed)
Physical Therapy Treatment Patient Details Name: Douglas [name]. MRN: [id number] DOB: [birth year] Today's Date: 11/26/2019    History of Present Illness Douglas [name] is a 69 yo male presenting with AMS and concern for seizures. Upon workup, CT was unremarkable but EEG was suggestive of eileptogenicity in left frontal-anterior temporal region as well as cortical dysfunction in left hemisphere likely secondary to underlying infarct. PMH includes: CVA with R hemiplegia, HTN, DM II, HLD, CHD IIIa, and CHF.    PT Comments    Pt progressing towards physical therapy goals. Was able to perform transfers with up to +2 total assist and was intermittently following commands throughout session. SNF recommendation remains appropriate. Will continue to follow.     Follow Up Recommendations  SNF;Supervision/Assistance - 24 hour     Equipment Recommendations  Other (comment) (TBD by next venus of care)    Recommendations for Other Services       Precautions / Restrictions Precautions Precautions: Fall Precaution Comments: residual R hemiplegia, aphasia, R shoulder pseudosubluxation Restrictions Weight Bearing Restrictions: No    Mobility  Bed Mobility Overal bed mobility: Needs Assistance Bed Mobility: Rolling;Sidelying to Sit Rolling: Max assist Sidelying to sit: Max assist;+2 for physical assistance       General bed mobility comments: Pt able to assist very minimally.  He demonstrates significant processing and initiation delay. The pt did show slight initiation of LUE reach when rolling to his R  Transfers Overall transfer level: Needs assistance Equipment used: 2 person hand held assist Transfers: Sit to/from [employer] Sit to Stand: Total assist;+2 physical assistance;+2 safety/equipment   Squat pivot transfers: Total assist;+2 physical assistance     General transfer comment: Pt requires assist for all aspects.  He is unable to fully extend hips and  knees. Multimodal cues provided throughout transition to the chair.   Ambulation/Gait             General Gait Details: pt unable   Stairs             Wheelchair Mobility    Modified Rankin (Stroke Patients Only) Modified Rankin (Stroke Patients Only) Pre-Morbid Rankin Score: Moderately severe disability Modified Rankin: Severe disability     Balance Overall balance assessment: Needs assistance Sitting-balance support: Single extremity supported;Feet supported Sitting balance-Leahy Scale: Poor Sitting balance - Comments: pt requires min A - mod A for EOB sitting.  He demonstrates Lt and posterior bias  Postural control: Posterior lean;Left lateral lean Standing balance support: Bilateral upper extremity supported Standing balance-Leahy Scale: Zero Standing balance comment: maxA of 2 to maintain partial stand                            Cognition Arousal/Alertness: Awake/alert Behavior During Therapy: Flat affect Overall Cognitive Status: No family/caregiver present to determine baseline cognitive functioning                                 General Comments: Difficult to assess fully due to communication deficits and unknown PLOF. Intermittently able to follow single-step commands with significant cues and extra processing time      Exercises      General Comments        Pertinent Vitals/Pain Pain Assessment: Faces Faces Pain Scale: No hurt    Home Living  Prior Function            PT Goals (current goals can now be found in the care plan section) Acute Rehab PT Goals PT Goal Formulation: Patient unable to participate in goal setting Time For Goal Achievement: 12/07/19 Potential to Achieve Goals: Good Progress towards PT goals: Progressing toward goals    Frequency    Min 2X/week      PT Plan Current plan remains appropriate    Co-evaluation              AM-PAC PT "6 Clicks"  Mobility   Outcome Measure  Help needed turning from your back to your side while in a flat bed without using bedrails?: Total Help needed moving from lying on your back to sitting on the side of a flat bed without using bedrails?: Total Help needed moving to and from a bed to a chair (including a wheelchair)?: Total Help needed standing up from a chair using your arms (e.g., wheelchair or bedside chair)?: Total Help needed to walk in hospital room?: Total Help needed climbing 3-5 steps with a railing? : Total 6 Click Score: 6    End of Session Equipment Utilized During Treatment: Gait belt Activity Tolerance: Patient limited by fatigue Patient left: in bed;with bed alarm set;with call bell/phone within reach Nurse Communication: Mobility status PT Visit Diagnosis: Muscle weakness (generalized) (M62.81);Other abnormalities of gait and mobility (R26.89)     Time: 1017-5102 PT Time Calculation (min) (ACUTE ONLY): 24 min  Charges:  $Gait Training: 23-37 mins                     Conni Slipper, PT, DPT Acute Rehabilitation Services Pager: [phone] Office: [phone]    Marylynn Pearson 11/26/2019, 3:32 PM

## 2019-11-26 NOTE — Plan of Care (Signed)
  Problem: Activity: Goal: Risk for activity intolerance will decrease Outcome: Progressing   Problem: Coping: Goal: Level of anxiety will decrease Outcome: Progressing   Problem: Pain Managment: Goal: General experience of comfort will improve Outcome: Progressing   Problem: Safety: Goal: Ability to remain free from injury will improve Outcome: Progressing   

## 2019-11-27 LAB — GLUCOSE, CAPILLARY
Glucose-Capillary: 106 mg/dL — ABNORMAL HIGH (ref 70–99)
Glucose-Capillary: 140 mg/dL — ABNORMAL HIGH (ref 70–99)
Glucose-Capillary: 152 mg/dL — ABNORMAL HIGH (ref 70–99)
Glucose-Capillary: 82 mg/dL (ref 70–99)

## 2019-11-27 LAB — CBC WITH DIFFERENTIAL/PLATELET
Abs Immature Granulocytes: 0.04 10*3/uL (ref 0.00–0.07)
Basophils Absolute: 0 10*3/uL (ref 0.0–0.1)
Basophils Relative: 1 %
Eosinophils Absolute: 0.2 10*3/uL (ref 0.0–0.5)
Eosinophils Relative: 3 %
HCT: 33.9 % — ABNORMAL LOW (ref 39.0–52.0)
Hemoglobin: 11.1 g/dL — ABNORMAL LOW (ref 13.0–17.0)
Immature Granulocytes: 1 %
Lymphocytes Relative: 25 %
Lymphs Abs: 1.6 10*3/uL (ref 0.7–4.0)
MCH: 29.6 pg (ref 26.0–34.0)
MCHC: 32.7 g/dL (ref 30.0–36.0)
MCV: 90.4 fL (ref 80.0–100.0)
Monocytes Absolute: 0.5 10*3/uL (ref 0.1–1.0)
Monocytes Relative: 8 %
Neutro Abs: 3.8 10*3/uL (ref 1.7–7.7)
Neutrophils Relative %: 62 %
Platelets: 208 10*3/uL (ref 150–400)
RBC: 3.75 MIL/uL — ABNORMAL LOW (ref 4.22–5.81)
RDW: 12.9 % (ref 11.5–15.5)
WBC: 6.1 10*3/uL (ref 4.0–10.5)
nRBC: 0 % (ref 0.0–0.2)

## 2019-11-27 MED ORDER — ENOXAPARIN SODIUM 40 MG/0.4ML ~~LOC~~ SOLN
40.0000 mg | SUBCUTANEOUS | Status: DC
  Administered 2019-11-27: 40 mg via SUBCUTANEOUS
  Filled 2019-11-27: qty 0.4

## 2019-11-27 MED ORDER — LAMOTRIGINE 150 MG PO TABS: 150.0000 mg | ORAL_TABLET | Freq: Two times a day (BID) | ORAL | 0 refills | Status: DC

## 2019-11-27 NOTE — TOC Transition Note (Signed)
Transition of Care [facility]) - CM/SW Discharge Note   Patient Details  Name: Douglas [name]. MRN: [id number] Date of Birth: 1950-09-06  [facility]) CM/SW Contact:  Baldemar Lenis, LCSW Phone Number: 11/27/2019, 11:54 AM   Clinical Narrative:   Nurse to call report to [phone], Room 515    Final next level of care: Skilled Nursing Facility Barriers to Discharge: Barriers Resolved   Patient Goals and CMS Choice   CMS Medicare.gov Compare Post Acute Care list provided to:: Patient Represenative (must comment) Cala Bradford, daughter) Choice offered to / list presented to : Adult Children  Discharge Placement              Patient chooses bed at: [facility] Patient to be transferred to facility by: [facility] Name of family member notified: Daughter Patient and family notified of of transfer: 11/27/19  Discharge Plan and Services                                     Social Determinants of Health (SDOH) Interventions     Readmission Risk Interventions No flowsheet data found.

## 2019-11-27 NOTE — Discharge Summary (Signed)
Physician Discharge Summary  Douglas [name]. ZOX:[id number] DOB: 1950/08/22 DOA: 11/20/2019  PCP: Ralene Ok, MD  Admit date: 11/20/2019 Discharge date: 11/27/2019  Admitted From: Home Disposition:  SNF  Recommendations for Outpatient Follow-up:  1. Follow up with PCP in 1-2 weeks 2. Please obtain BMP/CBC in one week  Discharge Condition:Stable  CODE STATUS:Full  Diet recommendation:  As tolerated  Brief/Interim Summary: Douglas [name]. is a 69 y.o. male with history of right-sided hemiplegia from previous stroke, hypertension, diabetes mellitus type 2 was noticed to have staring spells as per the patient's daughter.  The first episode was around 48 hours ago in the evening which lasted for few seconds and then got corrected.  The next one was noticed this morning when patient woke up.  Patient was not making anything since then it was sounding.  Later on patient went to sleep and when patient's daughter attempted to wake him up around 2:00 he was seen dogs which are nonexistent.  At this point patient was brought to the ER.   In the ER CT head was unremarkable. There was some concern for status epilepticus and neurologist on-call was consulted patient was given increased dose of Lamictal and 1 loading dose of Vimpat. Marland Kitchen cEEG showed epileptiform activity in left frontal anterior temporal region as well as cortical dysfunction in the left hemisphere.  Due to having dysphagia, he was continued on IV Vimpat.  Once he passed swallow evaluation.  He was started on increased dose of Lamictal from 125 to 150 mg twice daily and was overlapped with Vimpat 2 doses per neuro recommendations.  His daughter raised concern about possible issue with right shoulder.  Patient has right hemiplegia.  Patient did not have any pain.  No tenderness.  X-ray was done which showed dislocation.  Per family's request, Ortho was consulted.  They noted that it is pseudosubluxation and does not need any treatment or  surgical intervention. He was seen by PT OT and they recommended SNF.  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)   Complex partial seizure disorder (HCC)   Seizures (HCC)    Discharge Instructions  Discharge Instructions    Call MD for:  difficulty breathing, headache or visual disturbances   Complete by: As directed    Call MD for:  extreme fatigue   Complete by: As directed    Call MD for:  hives   Complete by: As directed    Call MD for:  persistant dizziness or light-headedness   Complete by: As directed    Call MD for:  persistant nausea and vomiting   Complete by: As directed    Call MD for:  severe uncontrolled pain   Complete by: As directed    Call MD for:  temperature >100.4   Complete by: As directed    Diet - low sodium heart healthy   Complete by: As directed    Increase activity slowly   Complete by: As directed      Allergies as of 11/27/2019      Reactions   Ciprofloxacin Anaphylaxis   Keppra [levetiracetam] Other (See Comments)   Makes the patient very agitated and "not like himself"   Avocado Nausea And Vomiting   Metformin And Related Other (See Comments)  Renal issue      Medication List    STOP taking these medications   polyethylene glycol 17 g packet Commonly known as: MiraLax     TAKE these medications   amLODipine 10 MG tablet Commonly known as: NORVASC Take 1 tablet (10 mg total) by mouth daily.   ascorbic acid 500 MG tablet Commonly known as: VITAMIN C Take 1 tablet (500 mg total) by mouth daily as needed (if cold-like symptoms are present). What changed: when to take this   aspirin EC 325 MG tablet Take 325 mg by mouth every morning.   atorvastatin 40 MG tablet Commonly known as: LIPITOR Take 1  tablet (40 mg total) by mouth daily at 6 PM. What changed: when to take this   baclofen 10 MG tablet Commonly known as: LIORESAL Take 2 tablets (20 mg total) by mouth in the morning and at bedtime for 5 days.   bisacodyl 10 MG suppository Commonly known as: Dulcolax Place 1 suppository (10 mg total) rectally 2 (two) times daily.   carvedilol 12.5 MG tablet Commonly known as: COREG Take 1 tablet (12.5 mg total) by mouth 2 (two) times daily with a meal. What changed: when to take this   clopidogrel 75 MG tablet Commonly known as: PLAVIX Take 1 tablet (75 mg total) by mouth daily.   Gerhardt's butt cream Crea Apply 1 application topically daily.   glipiZIDE 10 MG tablet Commonly known as: GLUCOTROL Take 10 mg by mouth 2 (two) times daily with a meal.   indapamide 1.25 MG tablet Commonly known as: LOZOL Take 1 tablet (1.25 mg total) by mouth daily.   insulin aspart 100 UNIT/ML injection Commonly known as: NovoLOG Inject 2-15 Units into the skin 4 (four) times daily -  before meals and at bedtime. SSI:  121-150 = 2 units; 151-200 = 3 units, 201-250 = 5 units, 251-300 = 8 units, 301-350 = 11 units, 351-300 = 15 units, greater than 400 = 15 units and call MD   insulin detemir 100 UNIT/ML injection Commonly known as: LEVEMIR Inject 0.16 mLs (16 Units total) into the skin daily. If CBG >125 (per daughter) What changed:   when to take this  reasons to take this  additional instructions   lamoTRIgine 25 MG tablet Commonly known as: LAMICTAL Take 5 tablets (125 mg total) by mouth 2 (two) times daily. Five tablets What changed:   how much to take  when to take this  additional instructions   lisinopril 20 MG tablet Commonly known as: ZESTRIL Take 1 tablet (20 mg total) by mouth 2 (two) times daily. What changed:   when to take this  additional instructions   liver oil-zinc oxide 40 % ointment Commonly known as: DESITIN Apply topically 2 (two) times daily as  needed for irritation (for protection).   midodrine 5 MG tablet Commonly known as: PROAMATINE Take 1 tablet (5 mg total) by mouth in the morning, at noon, and at bedtime. FOR ORTHOSTATIC HYPOTENSION   pantoprazole 40 MG tablet Commonly known as: PROTONIX Take 1 tablet (40 mg total) by mouth daily.   senna-docusate 8.6-50 MG tablet Commonly known as: Senokot-S Take 1 tablet by mouth daily.   tamsulosin 0.4 MG Caps capsule Commonly known as: FLOMAX Take 1 capsule (0.4 mg total) by mouth daily after supper.   VITAMIN D3 PO Take 1 tablet by mouth daily.   Vitamin D3 25 MCG tablet Commonly known as: Vitamin D Take 0.5 tablets (500 Units total) by mouth  daily.       Allergies  Allergen Reactions  . Ciprofloxacin Anaphylaxis  . Keppra [Levetiracetam] Other (See Comments)    Makes the patient very agitated and "not like himself"  . Avocado Nausea And Vomiting  . Metformin And Related Other (See Comments)    Renal issue    Consultations:  Neuro  Procedures/Studies: DG Shoulder Right  Result Date: 11/22/2019 CLINICAL DATA:  Shoulder pain. EXAM: RIGHT SHOULDER - 2+ VIEW COMPARISON:  Radiographs 07/15/2019. FINDINGS: AP and Y-views are submitted. The Y view is limited. The bones are demineralized. There is recurrent anterior glenohumeral dislocation without evidence of acute fracture. Underlying glenohumeral and acromioclavicular degenerative changes are present. IMPRESSION: Limited examination with evidence of recurrent anterior glenohumeral dislocation. No evidence of acute fracture. Electronically Signed   By: Carey Bullocks M.D.   On: 11/22/2019 17:33   CT Head Wo Contrast  Result Date: 11/20/2019 CLINICAL DATA:  Stroke suspected. Additional provided: Altered mental status. EXAM: CT HEAD WITHOUT CONTRAST TECHNIQUE: Contiguous axial images were obtained from the base of the skull through the vertex without intravenous contrast. COMPARISON:  Prior head CT examinations  11/18/2019 and earlier FINDINGS: Brain: Redemonstrated extensive chronic infarction changes within the left MCA and PCA vascular territories. Ex vacuo dilatation of the left lateral ventricle. Stable background mild-to-moderate generalized parenchymal atrophy and moderate cerebral white matter chronic small vessel ischemic disease. No acute intracranial hemorrhage or acute demarcated cortical infarction is identified. No extra-axial fluid collection. No evidence of intracranial mass. No midline shift. Vascular: No hyperdense vessel.  Atherosclerotic calcifications. Skull: Normal. Negative for fracture or focal lesion. Sinuses/Orbits: Visualized orbits show no acute finding. No significant paranasal sinus disease or mastoid effusion at the imaged levels. IMPRESSION: No CT evidence of acute intracranial abnormality. Redemonstrated extensive chronic infarction changes within the left MCA and PCA vascular territories. Stable background mild-to-moderate generalized parenchymal atrophy and moderate cerebral white matter chronic small vessel ischemic disease. Electronically Signed   By: Jackey Loge DO   On: 11/20/2019 15:48   CT Head Wo Contrast  Result Date: 11/19/2019 CLINICAL DATA:  Headache, altered mental status, combativeness, prior infarct EXAM: CT HEAD WITHOUT CONTRAST TECHNIQUE: Contiguous axial images were obtained from the base of the skull through the vertex without intravenous contrast. COMPARISON:  CT 07/15/2019, MRI 06/17/2019 FINDINGS: Brain: Large left remote MCA and PCA territory infarcts are again identified. Moderate parenchymal volume loss. Extensive periventricular white matter changes are present likely reflecting the sequela of small vessel ischemia. Mild ex vacuo dilation of the left lateral ventricle. Ventricular size is normal. No evidence of acute intracranial hemorrhage or infarct. No abnormal mass effect or midline shift. No abnormal intra or extra-axial mass lesion or fluid collection.  Vascular: Extensive vascular calcifications are seen within the intracranial vertebral arteries and carotid siphons. Hyperdensity of the left MCA at the skull base is similar to prior examination and likely related to intracranial atherosclerotic disease. Skull: The calvarial vertex is not included on this examination. The viscera is calvarium is intact. Sinuses/Orbits: The orbits are unremarkable. The paranasal sinuses are clear. Other: Mastoid air cells and middle ear cavities are clear. IMPRESSION: No evidence of acute intracranial hemorrhage or infarct. Large, remote left MCA and PCA territory infarct again noted. Extensive intracranial atherosclerotic disease. Electronically Signed   By: Helyn Numbers MD   On: 11/19/2019 00:07   CT SHOULDER RIGHT WO CONTRAST  Result Date: 11/23/2019 CLINICAL DATA:  Shoulder fracture. EXAM: CT OF THE UPPER RIGHT EXTREMITY WITHOUT CONTRAST TECHNIQUE:  Multidetector CT imaging of the right shoulder was performed according to the standard protocol. COMPARISON:  Radiographs 11/22/2019 and 07/15/2019. FINDINGS: Bones/Joint/Cartilage Anterior subluxation of the right glenohumeral joint without frank dislocation. There is resulting narrowing of the coracohumeral distance to 3 mm. There are underlying moderate glenohumeral degenerative changes with diffuse chondral thinning and osteophytes. No evidence of acute fracture. Mild degenerative changes are present at the acromioclavicular joint. No large shoulder joint effusion. Ligaments Suboptimally assessed by CT. Muscles and Tendons No focal rotator cuff muscular atrophy. There is no significant narrowing of the subacromial space or gross impingement on the supraspinatus or infraspinatus tendons. Due to the anterior glenohumeral subluxation, there is probable chronic encroachment on the subscapularis tendon. Soft tissues Unremarkable. IMPRESSION: 1. 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 Based  on prior radiographs, this may be chronic or recurrent. 2. Underlying moderate glenohumeral degenerative changes. 3. No evidence of acute fracture. 4. Probable chronic encroachment on the subscapularis tendon due to the anterior glenohumeral subluxation and resulting subcoracoid impingement. Electronically Signed   By: Carey Bullocks M.D.   On: 11/23/2019 11:51   DG Chest Portable 1 View  Result Date: 11/20/2019 CLINICAL DATA:  Altered mental status. Increased lethargy. Foul urine odor. EXAM: PORTABLE CHEST 1 VIEW COMPARISON:  11/18/2019 FINDINGS: The heart size and mediastinal contours are within normal limits. Loop recorder overlies the LEFT lung base. Both lungs are clear. The visualized skeletal structures are unremarkable. IMPRESSION: No active disease. Electronically Signed   By: Norva Pavlov M.D.   On: 11/20/2019 15:09   DG Chest Port 1 View  Result Date: 11/18/2019 CLINICAL DATA:  Altered EXAM: PORTABLE CHEST 1 VIEW COMPARISON:  07/15/2019 FINDINGS: Left-sided electronic recording device. No focal opacity or pleural effusion. Stable cardiomediastinal silhouette. No pneumothorax. IMPRESSION: No active disease. Electronically Signed   By: Jasmine Pang M.D.   On: 11/18/2019 20:39   DG Swallowing Func-Speech Pathology  Result Date: 11/22/2019 Objective Swallowing Evaluation: Type of Study: MBS-Modified Barium Swallow Study  Patient Details Name: Douglas Carithers. MRN: [id number] Date of Birth: 21-Sep-1950 Today's Date: 11/22/2019 Time: SLP Start Time (ACUTE ONLY): 1310 -SLP Stop Time (ACUTE ONLY): 1326 SLP Time Calculation (min) (ACUTE ONLY): 16 min Past Medical History: Past Medical History: Diagnosis Date . Acute blood loss anemia  . Acute embolic stroke (HCC)  . Acute lower UTI  . AMS (altered mental status) 06/05/2019 . Cerebral edema (HCC) 04/04/2018 . Diabetes 1.5, managed as type 2 (HCC)  . Diabetes mellitus without complication (HCC)  . History of CVA with residual deficit  . History of  Dysphagia, post-stroke  . Hypertension  . Ischemic stroke (HCC) 03/05/2019 . Labile blood glucose  . 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 Renal insufficiency 04/04/2018 . Stroke (HCC)  . UTI (urinary tract infection) 03/05/2019 Past Surgical History: Past Surgical History: Procedure Laterality Date . IR PATIENT EVAL TECH 0-60 MINS  03/30/2018 . LOOP RECORDER INSERTION N/A 03/08/2019  Procedure: LOOP RECORDER INSERTION;  Surgeon: Regan Lemming, MD;  Location: [facility] INVASIVE CV LAB;  Service: Cardiovascular;  Laterality: N/A; . TEE WITHOUT CARDIOVERSION N/A 04/02/2018  Procedure: TRANSESOPHAGEAL ECHOCARDIOGRAM (TEE);  Surgeon: [name], MD;  Location: [facility] ENDOSCOPY;  Service: Cardiovascular;  Laterality: N/A; HPI: Pt is a 69 y.o. 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 CT of the head  and CXR were negative for acute changes. EEG:  eileptogenicity in left frontal-anterior temporal region as well as  cortical dysfunction in left hemisphere likely secondary to underlying infarct and can also be seen in post-ictal state. Pt has a hx of oropharyngeal dysphagia with most recent recommendations for Dysphagia 3 (soft) solids and honey-thick liquids following MBS on 06/18/19.  BSE 07/16/19: prolonged mastication with ground solids and soft solids and suspect prolonged AP transport and delayed swallow initiation with all trials. An immediate cough was observed following thin liquid trials.  A dysphagia 2 diet with honey thick liquids was recommended at that time.  No data recorded Assessment / Plan / Recommendation CHL IP CLINICAL IMPRESSIONS 11/22/2019 Clinical Impression Pt presents with likely acute on chronic oropharyngeal dysphagia characterized by prolonged mastication, impaired bolus cohesion, a pharyngeal delay, and  reduced lingual retraction. He demonstrated lingual residue, premature spillage to the valleculae with solids and to the pyriform sinuses with liquids, and aspiration (PAS 7) with thin liquids via cup and with nectar thick liquids via straw. He was unable to demonstrate compensatory strategies despite cues due to his difficulty following commands. SLP will follow pt to ensure diet tolerance. Dysphagia treatment is recommended; however, the extent to which the pt will be able to participate is questioned due to aphasia.  SLP Visit Diagnosis Dysphagia, oropharyngeal phase (R13.12) Attention and concentration deficit following -- Frontal lobe and executive function deficit following -- Impact on safety and function Mild aspiration risk   CHL IP TREATMENT RECOMMENDATION 11/22/2019 Treatment Recommendations Therapy as outlined in treatment plan below   Prognosis 11/22/2019 Prognosis for Safe Diet Advancement Fair Barriers to Reach Goals Language deficits Barriers/Prognosis Comment -- CHL IP DIET RECOMMENDATION 11/22/2019 SLP Diet Recommendations Dysphagia 2 (Fine chop) solids;Nectar thick liquid Liquid Administration via Cup;No straw Medication Administration Whole meds with puree Compensations Small sips/bites;Slow rate Postural Changes Seated upright at 90 degrees   CHL IP OTHER RECOMMENDATIONS 11/22/2019 Recommended Consults -- Oral Care Recommendations Oral care BID Other Recommendations Order thickener from pharmacy   CHL IP FOLLOW UP RECOMMENDATIONS 11/22/2019 Follow up Recommendations Outpatient SLP;Home health SLP   CHL IP FREQUENCY AND DURATION 11/22/2019 Speech Therapy Frequency (ACUTE ONLY) min 2x/week Treatment Duration 2 weeks      CHL IP ORAL PHASE 11/22/2019 Oral Phase Impaired Oral - Pudding Teaspoon -- Oral - Pudding Cup -- Oral - Honey Teaspoon -- Oral - Honey Cup -- Oral - Nectar Teaspoon -- Oral - Nectar Cup Decreased bolus cohesion;Premature spillage;Lingual/palatal residue Oral - Nectar Straw Decreased  bolus cohesion;Premature spillage;Lingual/palatal residue Oral - Thin Teaspoon -- Oral - Thin Cup Decreased bolus cohesion;Premature spillage;Lingual/palatal residue Oral - Thin Straw -- Oral - Puree Decreased bolus cohesion;Premature spillage;Lingual/palatal residue Oral - Mech Soft Decreased bolus cohesion;Premature spillage;Lingual/palatal residue;Impaired mastication Oral - Regular Decreased bolus cohesion;Premature spillage;Lingual/palatal residue;Impaired mastication Oral - Multi-Consistency -- Oral - Pill -- Oral Phase - Comment --  CHL IP PHARYNGEAL PHASE 11/22/2019 Pharyngeal Phase Impaired Pharyngeal- Pudding Teaspoon -- Pharyngeal -- Pharyngeal- Pudding Cup -- Pharyngeal -- Pharyngeal- Honey Teaspoon -- Pharyngeal -- Pharyngeal- Honey Cup -- Pharyngeal -- Pharyngeal- Nectar Teaspoon -- Pharyngeal -- Pharyngeal- Nectar Cup Delayed swallow initiation-pyriform sinuses Pharyngeal -- Pharyngeal- Nectar Straw Delayed swallow initiation-pyriform sinuses;Penetration/Aspiration during swallow Pharyngeal Material enters airway, passes BELOW cords and not ejected out despite cough attempt by patient Pharyngeal- Thin Teaspoon -- Pharyngeal -- Pharyngeal- Thin Cup -- Pharyngeal -- Pharyngeal- Thin Straw -- Pharyngeal -- Pharyngeal- Puree Delayed swallow initiation-vallecula;Reduced tongue base retraction Pharyngeal -- Pharyngeal-  Mechanical Soft Delayed swallow initiation-vallecula;Delayed swallow initiation-pyriform sinuses;Reduced tongue base retraction Pharyngeal -- Pharyngeal- Regular Delayed swallow initiation-vallecula;Reduced tongue base retraction Pharyngeal -- Pharyngeal- Multi-consistency -- Pharyngeal -- Pharyngeal- Pill Delayed swallow initiation-vallecula;Reduced tongue base retraction Pharyngeal -- Pharyngeal Comment --  CHL IP CERVICAL ESOPHAGEAL PHASE 11/22/2019 Cervical Esophageal Phase WFL Pudding Teaspoon -- Pudding Cup -- Honey Teaspoon -- Honey Cup -- Nectar Teaspoon -- Nectar Cup -- Nectar Straw --  Thin Teaspoon -- Thin Cup -- Thin Straw -- Puree -- Mechanical Soft -- Regular -- Multi-consistency -- Pill -- Cervical Esophageal Comment -- Shanika I. Vear Clock, MS, CCC-SLP Acute Rehabilitation Services Office number [phone] Pager [phone] Scheryl Marten 11/22/2019, 2:00 PM              EEG LTVM - Continuous Bedside W/ Video Includes Portable EEG Read  Result Date: 11/20/2019 Charlsie Quest, MD     11/21/2019  7:40 AM Patient Name: Douglas [name]. MRN: [id number] Epilepsy Attending: Charlsie Quest Referring Physician/Provider: Dr Velta Addison Date: 11/20/2019 Duration: 21.36mins Patient history: 69 y.o. 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 Neuro exam with R plegia, not following commands. EEG to evaluate for seizure. Level of alertness: Awake AEDs during EEG study: LTG, Vimpat Technical aspects: This EEG study was done with scalp electrodes positioned according to the 10-20 International system of electrode placement. Electrical activity was acquired at a sampling rate of 500Hz  and reviewed with a high frequency filter of 70Hz  and a low frequency filter of 1Hz . EEG data were recorded continuously and digitally stored. Description: The posterior dominant rhythm consists of 8-9 Hz activity of moderate voltage (25-35 uV) seen predominantly in posterior head regions, asymmetric ( L<R) and reactive to eye opening and eye closing. EEG showed continuous left hemispheric 3 to 6 Hz theta-delta slowing. Hyperventilation and photic stimulation were not performed.   ABNORMALITY -Continuous slow, left hemisphere - Background asymmetry, left<right IMPRESSION: This study is suggestive of cortical dysfunction in left hemisphere likely secondary to underlying infarct and can also be seen in post-ictal state. No seizures  or epileptiform discharges were seen throughout the recording. Charlsie Quest   CT Renal Stone Study  Result Date: 11/19/2019 CLINICAL DATA:  Decreased urine output. EXAM: CT ABDOMEN AND PELVIS WITHOUT CONTRAST TECHNIQUE: Multidetector CT imaging of the abdomen and pelvis was performed following the standard protocol without IV contrast. COMPARISON:  None. FINDINGS: Lower chest: No acute abnormality. Hepatobiliary: No focal liver abnormality is seen. A subcentimeter gallstone is seen within the gallbladder lumen without evidence of gallbladder wall thickening or biliary dilatation. Pancreas: Unremarkable. No pancreatic ductal dilatation or surrounding inflammatory changes. Spleen: Normal in size without focal abnormality. Adrenals/Urinary Tract: Adrenal glands are unremarkable. Kidneys are normal in size without focal lesions. Multiple 2 mm nonobstructing renal stones are seen within the left kidney. A cluster of 3 mm and 4 mm nonobstructing renal stones are also seen within the lower pole of the left kidney. Bladder is unremarkable. Stomach/Bowel: Stomach is within normal limits. Appendix appears normal. A very large amount of stool is seen within the distal sigmoid colon and rectum. No evidence of bowel wall thickening, distention, or inflammatory changes. Vascular/Lymphatic: There is mild calcification of the abdominal aorta. No enlarged abdominal or pelvic lymph nodes. Reproductive: Prostate is unremarkable. Other: No abdominal wall  hernia or abnormality. No abdominopelvic ascites. Musculoskeletal: Marked severity degenerative changes seen at the level of L4-L5. IMPRESSION: 1. Cholelithiasis without evidence of cholecystitis. 2. Multiple nonobstructing renal stones within the left kidney. 3. Large amount of stool within the distal sigmoid colon and rectum. 4. Marked severity degenerative changes at the level of L4-L5. 5. Aortic atherosclerosis. Aortic Atherosclerosis (ICD10-I70.0). Electronically Signed    By: Aram Candela M.D.   On: 11/19/2019 01:54   CUP PACEART REMOTE DEVICE CHECK  Result Date: 11/21/2019 [facility] summary report received. Battery status OK. Normal device function. No new symptom episodes, tachy episodes, brady, or pause episodes. No new AF episodes. Monthly summary reports and ROV/PRN Hassell Halim, RN, CCDS, [employer]    Subjective: No acute issues or events overnight   Discharge Exam: Vitals:   11/27/19 0358 11/27/19 0753  BP: (!) 137/79 (!) 149/85  Pulse: 66 71  Resp: 19 18  Temp: 98.4 F (36.9 C) 98 F (36.7 C)  SpO2: 100% 100%   Vitals:   11/26/19 2004 11/27/19 0008 11/27/19 0358 11/27/19 0753  BP: (!) 141/80 (!) 138/74 (!) 137/79 (!) 149/85  Pulse: 70 68 66 71  Resp: Temp: 98.3 F (36.8 C) 97.7 F (36.5 C) 98.4 F (36.9 C) 98 F (36.7 C)  TempSrc: Oral Oral Oral Oral  SpO2: 99% 100% 100% 100%  Weight:      Height:        General: Pt is alert, awake, not in acute distress Cardiovascular: RRR, S1/S2 +, no rubs, no gallops Respiratory: CTA bilaterally, no wheezing, no rhonchi Abdominal: Soft, NT, ND, bowel sounds + Extremities: no edema, no cyanosis   The results of significant diagnostics from this hospitalization (including imaging, microbiology, ancillary and laboratory) are listed below for reference.     Microbiology: Recent Results (from the past 240 hour(s))  SARS Coronavirus 2 by RT PCR (hospital order, performed in [facility] hospital lab) Nasopharyngeal Nasopharyngeal Swab     Status: None   Collection Time: 11/20/19  6:18 PM   Specimen: Nasopharyngeal Swab  Result Value Ref Range Status   SARS Coronavirus 2 NEGATIVE NEGATIVE Final    Comment: (NOTE) SARS-CoV-2 target nucleic acids are NOT DETECTED.  The SARS-CoV-2 RNA is generally detectable in upper and lower respiratory specimens during the acute phase of infection. The lowest concentration of SARS-CoV-2 viral copies this assay can detect is  250 copies / mL. A negative result does not preclude SARS-CoV-2 infection and should not be used as the sole basis for treatment or other patient management decisions.  A negative result may occur with improper specimen collection / handling, submission of specimen other than nasopharyngeal swab, presence of viral mutation(s) within the areas targeted by this assay, and inadequate number of viral copies (<250 copies / mL). A negative result must be combined with clinical observations, patient history, and epidemiological information.  Fact Sheet for Patients:   BoilerBrush.com.cy  Fact Sheet for Healthcare Providers: https://pope.com/  This test is not yet approved or  cleared by the [place] FDA and has been authorized for detection and/or diagnosis of SARS-CoV-2 by FDA under an Emergency Use Authorization (EUA).  This EUA will remain in effect (meaning this test can be used) for the duration of the COVID-19 declaration under Section 564(b)(1) of the Act, 21 U.S.C. section 360bbb-3(b)(1), unless the authorization is terminated or revoked sooner.  Performed at [facility], [address], [place], [place] [address]  Urine culture     Status: Abnormal   Collection Time: 11/20/19  6:37 PM   Specimen: Urine, Random  Result Value Ref Range Status   Specimen Description URINE, RANDOM  Final   Special Requests NONE  Final   Culture (A)  Final    <10,000 COLONIES/mL INSIGNIFICANT GROWTH Performed at [facility], [address], [place], [place] [address]    Report Status 11/21/2019 FINAL  Final  SARS Coronavirus 2 by RT PCR (hospital order, performed in [facility] hospital lab) Nasopharyngeal Nasopharyngeal Swab     Status: None   Collection Time: 11/25/19  8:30 AM   Specimen: Nasopharyngeal Swab  Result Value Ref Range Status   SARS Coronavirus 2 NEGATIVE NEGATIVE Final    Comment: (NOTE) SARS-CoV-2 target  nucleic acids are NOT DETECTED.  The SARS-CoV-2 RNA is generally detectable in upper and lower respiratory specimens during the acute phase of infection. The lowest concentration of SARS-CoV-2 viral copies this assay can detect is 250 copies / mL. A negative result does not preclude SARS-CoV-2 infection and should not be used as the sole basis for treatment or other patient management decisions.  A negative result may occur with improper specimen collection / handling, submission of specimen other than nasopharyngeal swab, presence of viral mutation(s) within the areas targeted by this assay, and inadequate number of viral copies (<250 copies / mL). A negative result must be combined with clinical observations, patient history, and epidemiological information.  Fact Sheet for Patients:   BoilerBrush.com.cy  Fact Sheet for Healthcare Providers: https://pope.com/  This test is not yet approved or  cleared by the [place] FDA and has been authorized for detection and/or diagnosis of SARS-CoV-2 by FDA under an Emergency Use Authorization (EUA).  This EUA will remain in effect (meaning this test can be used) for the duration of the COVID-19 declaration under Section 564(b)(1) of the Act, 21 U.S.C. section 360bbb-3(b)(1), unless the authorization is terminated or revoked sooner.  Performed at [facility], [address] [place], [place] [address]      Labs: BNP (last 3 results) No results for input(s): BNP in the last 8760 hours.   Basic Metabolic Panel: Recent Labs  Lab 11/20/19 1802 11/21/19 0147 11/21/19 0326 11/22/19 1145 11/23/19 0255  NA 142 143  --  145 143  K 4.2 3.7  --  3.8 4.6  CL 109 109  --  113* 112*  CO2 22 25  --  21* 22  GLUCOSE 121* 121*  --  180* 90  BUN 32* 24*  --  16 10  CREATININE 1.42* 1.22 1.31* 1.11 0.90  CALCIUM 9.8 9.5  --  9.3 9.2  MG  --  2.0  --   --   --    Liver Function  Tests: Recent Labs  Lab 11/20/19 1802 11/21/19 0147  AST 21 15  ALT 25 23  ALKPHOS 89 82  BILITOT 1.1 1.0  PROT 7.1 6.8  ALBUMIN 4.2 3.9   No results for input(s): LIPASE, AMYLASE in the last 168 hours. No results for input(s): AMMONIA in the last 168 hours. CBC: Recent Labs  Lab 11/23/19 0255 11/24/19 0429 11/25/19 0302 11/26/19 0305 11/27/19 0255  WBC 5.8 6.5 5.5 6.7 6.1  NEUTROABS 3.6 4.1 3.4 4.4 3.8  HGB 13.1 12.3* 11.1* 12.7* 11.1*  HCT 40.9 37.9* 34.0* 38.2* 33.9*  MCV 94.9 91.8 92.4 91.0 90.4  PLT 193 215 195 239 208   Cardiac Enzymes:  No results for input(s): CKTOTAL, CKMB, CKMBINDEX, TROPONINI in the last 168 hours. BNP: Invalid input(s): POCBNP CBG: Recent Labs  Lab 11/26/19 1656 11/26/19 2009 11/27/19 0005 11/27/19 0357 11/27/19 0753  GLUCAP 181* 171* 152* 82 106*   D-Dimer No results for input(s): DDIMER in the last 72 hours. Hgb A1c No results for input(s): HGBA1C in the last 72 hours. Lipid Profile No results for input(s): CHOL, HDL, LDLCALC, TRIG, CHOLHDL, LDLDIRECT in the last 72 hours. Thyroid function studies No results for input(s): TSH, T4TOTAL, T3FREE, THYROIDAB in the last 72 hours.  Invalid input(s): FREET3 Anemia work up No results for input(s): VITAMINB12, FOLATE, FERRITIN, TIBC, IRON, RETICCTPCT in the last 72 hours. Urinalysis    Component Value Date/Time   COLORURINE YELLOW 11/20/2019 1437   APPEARANCEUR HAZY (A) 11/20/2019 1437   LABSPEC 1.017 11/20/2019 1437   PHURINE 5.0 11/20/2019 1437   GLUCOSEU NEGATIVE 11/20/2019 1437   HGBUR NEGATIVE 11/20/2019 1437   BILIRUBINUR NEGATIVE 11/20/2019 1437   KETONESUR NEGATIVE 11/20/2019 1437   PROTEINUR NEGATIVE 11/20/2019 1437   NITRITE NEGATIVE 11/20/2019 1437   LEUKOCYTESUR NEGATIVE 11/20/2019 1437   Sepsis Labs Invalid input(s): PROCALCITONIN,  WBC,  LACTICIDVEN Microbiology Recent Results (from the past 240 hour(s))  SARS Coronavirus 2 by RT PCR (hospital order,  performed in [facility] hospital lab) Nasopharyngeal Nasopharyngeal Swab     Status: None   Collection Time: 11/20/19  6:18 PM   Specimen: Nasopharyngeal Swab  Result Value Ref Range Status   SARS Coronavirus 2 NEGATIVE NEGATIVE Final    Comment: (NOTE) SARS-CoV-2 target nucleic acids are NOT DETECTED.  The SARS-CoV-2 RNA is generally detectable in upper and lower respiratory specimens during the acute phase of infection. The lowest concentration of SARS-CoV-2 viral copies this assay can detect is 250 copies / mL. A negative result does not preclude SARS-CoV-2 infection and should not be used as the sole basis for treatment or other patient management decisions.  A negative result may occur with improper specimen collection / handling, submission of specimen other than nasopharyngeal swab, presence of viral mutation(s) within the areas targeted by this assay, and inadequate number of viral copies (<250 copies / mL). A negative result must be combined with clinical observations, patient history, and epidemiological information.  Fact Sheet for Patients:   BoilerBrush.com.cy  Fact Sheet for Healthcare Providers: https://pope.com/  This test is not yet approved or  cleared by the [place] FDA and has been authorized for detection and/or diagnosis of SARS-CoV-2 by FDA under an Emergency Use Authorization (EUA).  This EUA will remain in effect (meaning this test can be used) for the duration of the COVID-19 declaration under Section 564(b)(1) of the Act, 21 U.S.C. section 360bbb-3(b)(1), unless the authorization is terminated or revoked sooner.  Performed at [facility], [address] [place], [place] [address]   Urine culture     Status: Abnormal   Collection Time: 11/20/19  6:37 PM   Specimen: Urine, Random  Result Value Ref Range Status   Specimen Description URINE, RANDOM  Final   Special Requests NONE  Final    Culture (A)  Final    <10,000 COLONIES/mL INSIGNIFICANT GROWTH Performed at [facility], [address], [place], [place] [address]    Report Status 11/21/2019 FINAL  Final  SARS Coronavirus 2 by RT PCR (hospital order, performed in [facility] hospital lab) Nasopharyngeal Nasopharyngeal Swab     Status: None   Collection Time: 11/25/19  8:30  AM   Specimen: Nasopharyngeal Swab  Result Value Ref Range Status   SARS Coronavirus 2 NEGATIVE NEGATIVE Final    Comment: (NOTE) SARS-CoV-2 target nucleic acids are NOT DETECTED.  The SARS-CoV-2 RNA is generally detectable in upper and lower respiratory specimens during the acute phase of infection. The lowest concentration of SARS-CoV-2 viral copies this assay can detect is 250 copies / mL. A negative result does not preclude SARS-CoV-2 infection and should not be used as the sole basis for treatment or other patient management decisions.  A negative result may occur with improper specimen collection / handling, submission of specimen other than nasopharyngeal swab, presence of viral mutation(s) within the areas targeted by this assay, and inadequate number of viral copies (<250 copies / mL). A negative result must be combined with clinical observations, patient history, and epidemiological information.  Fact Sheet for Patients:   BoilerBrush.com.cyhttps://www.fda.gov/media/136312/download  Fact Sheet for Healthcare Providers: https://pope.com/https://www.fda.gov/media/136313/download  This test is not yet approved or  cleared by the [place] FDA and has been authorized for detection and/or diagnosis of SARS-CoV-2 by FDA under an Emergency Use Authorization (EUA).  This EUA will remain in effect (meaning this test can be used) for the duration of the COVID-19 declaration under Section 564(b)(1) of the Act, 21 U.S.C. section 360bbb-3(b)(1), unless the authorization is terminated or revoked sooner.  Performed at [facility], [address]  [place], [place] [id number]      Time coordinating discharge: Over 30 minutes  SIGNED:   Azucena FallenWilliam C Melissia Lahman, DO [facility] 11/27/2019, 11:07 AM Pager   If 7PM-7AM, please contact night-coverage [email or web address]

## 2019-11-27 NOTE — Progress Notes (Addendum)
Pt's IV was removed; site is clean, dry and intact. Discharge instructions placed in envelope for receiving facility. Personal belongings packed and sent with pt. [name] arrived and is present for d/c. Pt. transported to receiving facility by [facility]. Report was given to nurse Olu at [facility].

## 2019-11-27 NOTE — Care Management Important Message (Signed)
Important Message  Patient Details  Name: Douglas [name]. MRN: [id number] Date of Birth: Sep 21, 1950   Medicare Important Message Given:  Yes  Patient left prior to IM delivery.  IM mailed to the patient     Dorena Bodo 11/27/2019, 2:20 PM

## 2019-11-27 NOTE — Progress Notes (Signed)
ANTICOAGULATION CONSULT NOTE - Initial Consult  Pharmacy Consult for Lovenox  Indication: VTE prophylaxis  Allergies  Allergen Reactions  . Ciprofloxacin Anaphylaxis  . Keppra [Levetiracetam] Other (See Comments)    Makes the patient very agitated and "not like himself"  . Avocado Nausea And Vomiting  . Metformin And Related Other (See Comments)    Renal issue    Patient Measurements: Height: 6' (182.9 cm) Weight: 78 kg (171 lb 15.3 oz) IBW/kg (Calculated) : 77.6   Vital Signs: Temp: 98.6 F (37 C) (07/28 1136) Temp Source: Oral (07/28 1136) BP: 137/101 (07/28 1136) Pulse Rate: 81 (07/28 1136)  Labs: Recent Labs    11/25/19 0302 11/25/19 0302 11/26/19 0305 11/27/19 0255  HGB 11.1*   < > 12.7* 11.1*  HCT 34.0*  --  38.2* 33.9*  PLT 195  --  239 208   < > = values in this interval not displayed.    Estimated Creatinine Clearance: 85 mL/min (by C-G formula based on SCr of 0.9 mg/dL).   Medical History: Past Medical History:  Diagnosis Date  . Acute blood loss anemia   . Acute embolic stroke (HCC)   . Acute lower UTI   . AMS (altered mental status) 06/05/2019  . Cerebral edema (HCC) 04/04/2018  . Diabetes 1.5, managed as type 2 (HCC)   . Diabetes mellitus without complication (HCC)   . History of CVA with residual deficit   . History of Dysphagia, post-stroke   . Hypertension   . Ischemic stroke (HCC) 03/05/2019  . Labile blood glucose   . 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 Renal insufficiency 04/04/2018  . Stroke (HCC)   . UTI (urinary tract infection) 03/05/2019    Medications:  Medications Prior to Admission  Medication Sig Dispense Refill Last Dose  . amLODipine (NORVASC) 10 MG tablet Take 1 tablet (10 mg total) by mouth daily. 30 tablet 0 11/20/2019 at Unknown time  . ascorbic acid (VITAMIN C) 500 MG tablet Take 1 tablet (500 mg total) by mouth daily as needed (if cold-like symptoms are present). (Patient taking differently: Take 500  mg by mouth daily. ) 30 tablet 0 11/20/2019 at Unknown time  . aspirin EC 325 MG tablet Take 325 mg by mouth every morning.   11/20/2019 at Unknown time  . atorvastatin (LIPITOR) 40 MG tablet Take 1 tablet (40 mg total) by mouth daily at 6 PM. (Patient taking differently: Take 40 mg by mouth at bedtime. ) 10 tablet 0 11/19/2019 at Unknown time  . bisacodyl (DULCOLAX) 10 MG suppository Place 1 suppository (10 mg total) rectally 2 (two) times daily. 6 suppository 0 11/20/2019 at Unknown time  . carvedilol (COREG) 12.5 MG tablet Take 1 tablet (12.5 mg total) by mouth 2 (two) times daily with a meal. (Patient taking differently: Take 12.5 mg by mouth in the morning and at bedtime. ) 60 tablet 0 11/20/2019 at 0800  . Cholecalciferol (VITAMIN D3 PO) Take 1 tablet by mouth daily.    11/20/2019 at Unknown time  . clopidogrel (PLAVIX) 75 MG tablet Take 1 tablet (75 mg total) by mouth daily. 30 tablet 0 11/20/2019 at Unknown time  . glipiZIDE (GLUCOTROL) 10 MG tablet Take 10 mg by mouth 2 (two) times daily with a meal.   11/20/2019 at Unknown time  . indapamide (LOZOL) 1.25 MG tablet Take 1 tablet (1.25 mg total) by mouth daily. 30 tablet 0 11/20/2019 at Unknown time  . insulin aspart (NOVOLOG) 100 UNIT/ML injection Inject  2-15 Units into the skin 4 (four) times daily -  before meals and at bedtime. SSI:  121-150 = 2 units; 151-200 = 3 units, 201-250 = 5 units, 251-300 = 8 units, 301-350 = 11 units, 351-300 = 15 units, greater than 400 = 15 units and call MD 20 mL 0 11/19/2019 at Unknown time  . insulin detemir (LEVEMIR) 100 UNIT/ML injection Inject 0.16 mLs (16 Units total) into the skin daily. If CBG >125 (per daughter) (Patient taking differently: Inject 16 Units into the skin daily as needed (CBG >125). ) 10 mL 0 11/19/2019 at Unknown time  . lamoTRIgine (LAMICTAL) 25 MG tablet Take 5 tablets (125 mg total) by mouth 2 (two) times daily. Five tablets (Patient taking differently: Take 100 mg by mouth in the morning and at  bedtime. ) 300 tablet 0 11/20/2019 at Unknown time  . lisinopril (ZESTRIL) 20 MG tablet Take 1 tablet (20 mg total) by mouth 2 (two) times daily. (Patient taking differently: Take 20 mg by mouth See admin instructions. Take one tablet (20 mg) by mouth twice daily - 10am and 3pm) 60 tablet 0 11/20/2019 at Unknown time  . midodrine (PROAMATINE) 5 MG tablet Take 1 tablet (5 mg total) by mouth in the morning, at noon, and at bedtime. FOR ORTHOSTATIC HYPOTENSION 90 tablet 0 11/20/2019 at Unknown time  . Nystatin (GERHARDT'S BUTT CREAM) CREA Apply 1 application topically daily.   11/19/2019 at Unknown time  . senna-docusate (SENOKOT-S) 8.6-50 MG tablet Take 1 tablet by mouth daily. 30 tablet 0 11/19/2019 at Unknown time  . [DISCONTINUED] baclofen (LIORESAL) 10 MG tablet Take 2 tablets (20 mg total) by mouth 2 (two) times daily. (Patient taking differently: Take 20 mg by mouth in the morning and at bedtime. ) 120 each 0 11/20/2019 at Unknown time  . liver oil-zinc oxide (DESITIN) 40 % ointment Apply topically 2 (two) times daily as needed for irritation (for protection). (Patient not taking: Reported on 11/18/2019) 56.7 g 0 Not Taking at Unknown time  . pantoprazole (PROTONIX) 40 MG tablet Take 1 tablet (40 mg total) by mouth daily. (Patient not taking: Reported on 11/18/2019) 30 tablet 0 Not Taking at Unknown time  . [EXPIRED] polyethylene glycol (MIRALAX) 17 g packet Take 17 g by mouth 2 (two) times daily for 5 days. (Patient not taking: Reported on 11/20/2019) 14 each 0 Not Taking at Unknown time  . tamsulosin (FLOMAX) 0.4 MG CAPS capsule Take 1 capsule (0.4 mg total) by mouth daily after supper. (Patient not taking: Reported on 11/18/2019) 30 capsule 0 Not Taking at Unknown time  . Vitamin D3 (VITAMIN D) 25 MCG tablet Take 0.5 tablets (500 Units total) by mouth daily. (Patient not taking: Reported on 11/18/2019) 15 tablet 0 Not Taking at Unknown time    Assessment: 69 y.o malewithhistory of right-sided hemiplegia  from previous stroke, hypertension, diabetes mellitus type 2 was noticed to have staring spells as per the patient's daughter, admitted 11/20/19 with seizure.  Pharmacy now consulted for Lovenox for VTE prophylaxis.  CrCl 85 ml/min.  Weight 78 kg.  Hgb stable 12-11 range last 4 days, pltc within normal limits/ stable. No bleeding reported.  Currently on ASA and plavix for h/o stroke.  Goal of Therapy:  Monitor platelets by anticoagulation protocol: Yes   Plan:  Lovenox 40 mg sq Q24h for VTE prophylaxis.  Pharmacy signed off.    Noah Delaine, RPh Clinical Pharmacist [phone] Please check AMION for all [facility] Pharmacy phone numbers After 10:00 PM, call  Main Pharmacy [phone]  11/27/2019,12:36 PM

## 2019-11-29 ENCOUNTER — Non-Acute Institutional Stay (SKILLED_NURSING_FACILITY): Payer: Medicare Other | Admitting: Internal Medicine

## 2019-11-29 ENCOUNTER — Encounter: Payer: Self-pay | Admitting: Internal Medicine

## 2019-11-29 DIAGNOSIS — N401 Enlarged prostate with lower urinary tract symptoms: Secondary | ICD-10-CM | POA: Diagnosis not present

## 2019-11-29 DIAGNOSIS — E1142 Type 2 diabetes mellitus with diabetic polyneuropathy: Secondary | ICD-10-CM

## 2019-11-29 DIAGNOSIS — N138 Other obstructive and reflux uropathy: Secondary | ICD-10-CM

## 2019-11-29 DIAGNOSIS — I69351 Hemiplegia and hemiparesis following cerebral infarction affecting right dominant side: Secondary | ICD-10-CM | POA: Diagnosis not present

## 2019-11-29 DIAGNOSIS — I10 Essential (primary) hypertension: Secondary | ICD-10-CM

## 2019-11-29 DIAGNOSIS — I951 Orthostatic hypotension: Secondary | ICD-10-CM

## 2019-11-29 DIAGNOSIS — G40209 Localization-related (focal) (partial) symptomatic epilepsy and epileptic syndromes with complex partial seizures, not intractable, without status epilepticus: Secondary | ICD-10-CM | POA: Diagnosis not present

## 2019-11-29 DIAGNOSIS — E1169 Type 2 diabetes mellitus with other specified complication: Secondary | ICD-10-CM

## 2019-11-29 DIAGNOSIS — I152 Hypertension secondary to endocrine disorders: Secondary | ICD-10-CM

## 2019-11-29 DIAGNOSIS — E1159 Type 2 diabetes mellitus with other circulatory complications: Secondary | ICD-10-CM

## 2019-11-29 DIAGNOSIS — E785 Hyperlipidemia, unspecified: Secondary | ICD-10-CM

## 2019-11-29 DIAGNOSIS — R4701 Aphasia: Secondary | ICD-10-CM | POA: Diagnosis not present

## 2019-11-29 DIAGNOSIS — Z789 Other specified health status: Secondary | ICD-10-CM

## 2019-11-29 NOTE — Progress Notes (Deleted)
Provider:  Gwenith Spitziffany L. Renato Gailseed, D.O., C.M.D. Location:      Place of Service:     PCP: Ralene OkMoreira, Roy, MD Patient Care Team: Ralene OkMoreira, Roy, MD as PCP - General (Internal Medicine)  Extended Emergency Contact Information Primary Emergency Contact: [facility] Dallas[facility]akes,Kimberly Home Phone: [phone] Mobile Phone: [phone] Relation: Daughter  Code Status: *** Goals of Care: Advanced Directive information Advanced Directives 11/20/2019  Does Patient Have a Medical Advance Directive? No  Type of Advance Directive -  Does patient want to make changes to medical advance directive? -  Copy of Healthcare Power of Attorney in Chart? -  Would patient like information on creating a medical advance directive? No - Patient declined      No chief complaint on file.   HPI: Patient is a 69 y.o. male seen today for admission to  Past Medical History:  Diagnosis Date  . Acute blood loss anemia   . Acute embolic stroke (HCC)   . Acute lower UTI   . AMS (altered mental status) 06/05/2019  . Cerebral edema (HCC) 04/04/2018  . Diabetes 1.5, managed as type 2 (HCC)   . Diabetes mellitus without complication (HCC)   . History of CVA with residual deficit   . History of Dysphagia, post-stroke   . Hypertension   . Ischemic stroke (HCC) 03/05/2019  . Labile blood glucose   . 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 Renal insufficiency 04/04/2018  . Stroke (HCC)   . UTI (urinary tract infection) 03/05/2019   Past Surgical History:  Procedure Laterality Date  . IR PATIENT EVAL TECH 0-60 MINS  03/30/2018  . LOOP RECORDER INSERTION N/A 03/08/2019   Procedure: LOOP RECORDER INSERTION;  Surgeon: Regan Lemmingamnitz, Will Martin, MD;  Location: [facility] INVASIVE CV [facility];  Service: Cardiovascular;  Laterality: N/A;  . TEE WITHOUT CARDIOVERSION N/A 04/02/2018   Procedure: TRANSESOPHAGEAL ECHOCARDIOGRAM (TEE);  Surgeon: Chilton Siandolph, Townes Fuhs, MD;  [facility];  Service: Cardiovascular;  Laterality: N/A;    Social  History   Socioeconomic History  . Marital status: Single    Spouse name: Not on file  . Number of children: Not on file  . Years of education: Not on file  . Highest education level: Not on file  Occupational History  . Not on file  Tobacco Use  . Smoking status: Never Smoker  . Smokeless tobacco: Never Used  Vaping Use  . Vaping Use: Never used  Substance and Sexual Activity  . Alcohol use: Not Currently  . Drug use: Never  . Sexual activity: Not Currently  Other Topics Concern  . Not on file  Social History Narrative  . Not on file   Social Determinants of Health   Financial Resource Strain:   . Difficulty of Paying Living Expenses:   Food Insecurity:   . Worried About Programme researcher, broadcasting/film/videounning Out of Food in the Last Year:   . Baristaan Out of Food in the Last Year:   Transportation Needs:   . Freight forwarderLack of Transportation (Medical):   Marland Kitchen. Lack of Transportation (Non-Medical):   Physical Activity:   . Days of Exercise per Week:   . Minutes of Exercise per Session:   Stress:   . Feeling of Stress :   Social Connections:   . Frequency of Communication with Friends and Family:   . Frequency of Social Gatherings with Friends and Family:   . Attends Religious Services:   . Active Member of Clubs or Organizations:   . Attends BankerClub or Organization Meetings:   .  Marital Status:     reports that he has never smoked. He has never used smokeless tobacco. He reports previous alcohol use. He reports that he does not use drugs.  Functional Status Survey:    Family History  Problem Relation Age of Onset  . Hypertension Mother   . Hypertension Father     Health Maintenance  Topic Date Due  . Hepatitis C Screening  Never done  . FOOT EXAM  Never done  . OPHTHALMOLOGY EXAM  Never done  . COVID-19 Vaccine (1) Never done  . TETANUS/TDAP  Never done  . COLONOSCOPY  Never done  . PNA vac Low Risk Adult (2 of 2 - PCV13) 01/01/2019  . INFLUENZA VACCINE  12/01/2019  . HEMOGLOBIN A1C  12/03/2019     Allergies  Allergen Reactions  . Ciprofloxacin Anaphylaxis  . Keppra [Levetiracetam] Other (See Comments)    Makes the patient very agitated and "not like himself"  . Avocado Nausea And Vomiting  . Metformin And Related Other (See Comments)    Renal issue    Outpatient Encounter Medications as of 11/29/2019  Medication Sig  . amLODipine (NORVASC) 10 MG tablet Take 1 tablet (10 mg total) by mouth daily.  Marland Kitchen ascorbic acid (VITAMIN C) 500 MG tablet Take 1 tablet (500 mg total) by mouth daily as needed (if cold-like symptoms are present). (Patient taking differently: Take 500 mg by mouth daily. )  . aspirin EC 325 MG tablet Take 325 mg by mouth every morning.  Marland Kitchen atorvastatin (LIPITOR) 40 MG tablet Take 1 tablet (40 mg total) by mouth daily at 6 PM. (Patient taking differently: Take 40 mg by mouth at bedtime. )  . baclofen (LIORESAL) 10 MG tablet Take 2 tablets (20 mg total) by mouth in the morning and at bedtime for 5 days.  . bisacodyl (DULCOLAX) 10 MG suppository Place 1 suppository (10 mg total) rectally 2 (two) times daily.  . carvedilol (COREG) 12.5 MG tablet Take 1 tablet (12.5 mg total) by mouth 2 (two) times daily with a meal. (Patient taking differently: Take 12.5 mg by mouth in the morning and at bedtime. )  . Cholecalciferol (VITAMIN D3 PO) Take 1 tablet by mouth daily.   . clopidogrel (PLAVIX) 75 MG tablet Take 1 tablet (75 mg total) by mouth daily.  Marland Kitchen glipiZIDE (GLUCOTROL) 10 MG tablet Take 10 mg by mouth 2 (two) times daily with a meal.  . indapamide (LOZOL) 1.25 MG tablet Take 1 tablet (1.25 mg total) by mouth daily.  . insulin aspart (NOVOLOG) 100 UNIT/ML injection Inject 2-15 Units into the skin 4 (four) times daily -  before meals and at bedtime. SSI:  121-150 = 2 units; 151-200 = 3 units, 201-250 = 5 units, 251-300 = 8 units, 301-350 = 11 units, 351-300 = 15 units, greater than 400 = 15 units and call MD  . insulin detemir (LEVEMIR) 100 UNIT/ML injection Inject 0.16 mLs (16  Units total) into the skin daily. If CBG >125 (per daughter) (Patient taking differently: Inject 16 Units into the skin daily as needed (CBG >125). )  . lamoTRIgine (LAMICTAL) 150 MG tablet Take 1 tablet (150 mg total) by mouth 2 (two) times daily.  Marland Kitchen lisinopril (ZESTRIL) 20 MG tablet Take 1 tablet (20 mg total) by mouth 2 (two) times daily. (Patient taking differently: Take 20 mg by mouth See admin instructions. Take one tablet (20 mg) by mouth twice daily - 10am and 3pm)  . liver oil-zinc oxide (DESITIN) 40 %  ointment Apply topically 2 (two) times daily as needed for irritation (for protection). (Patient not taking: Reported on 11/18/2019)  . midodrine (PROAMATINE) 5 MG tablet Take 1 tablet (5 mg total) by mouth in the morning, at noon, and at bedtime. FOR ORTHOSTATIC HYPOTENSION  . Nystatin (GERHARDT'S BUTT CREAM) CREA Apply 1 application topically daily.  . pantoprazole (PROTONIX) 40 MG tablet Take 1 tablet (40 mg total) by mouth daily. (Patient not taking: Reported on 11/18/2019)  . senna-docusate (SENOKOT-S) 8.6-50 MG tablet Take 1 tablet by mouth daily.  . tamsulosin (FLOMAX) 0.4 MG CAPS capsule Take 1 capsule (0.4 mg total) by mouth daily after supper. (Patient not taking: Reported on 11/18/2019)  . Vitamin D3 (VITAMIN D) 25 MCG tablet Take 0.5 tablets (500 Units total) by mouth daily. (Patient not taking: Reported on 11/18/2019)   No facility-administered encounter medications on file as of 11/29/2019.    ROS  There were no vitals filed for this visit. There is no height or weight on file to calculate BMI. Physical Exam  Labs reviewed: Basic Metabolic Panel: Recent Labs    06/05/19 1822 06/06/19 0441 11/21/19 0147 11/21/19 0147 11/21/19 0326 11/22/19 1145 11/23/19 0255  NA  --    < > 143  --   --  145 143  K  --    < > 3.7  --   --  3.8 4.6  CL  --    < > 109  --   --  113* 112*  CO2  --    < > 25  --   --  21* 22  GLUCOSE  --    < > 121*  --   --  180* 90  BUN  --    < > 24*   --   --  16 10  CREATININE 0.90   < > 1.22   < > 1.31* 1.11 0.90  CALCIUM 9.3   < > 9.5  --   --  9.3 9.2  MG 2.1  --  2.0  --   --   --   --   PHOS 2.9  --   --   --   --   --   --    < > = values in this interval not displayed.   Liver Function Tests: Recent Labs    11/18/19 2153 11/20/19 1802 11/21/19 0147  AST 20 21 15   ALT 28 25 23   ALKPHOS 95 89 82  BILITOT 0.7 1.1 1.0  PROT 7.2 7.1 6.8  ALBUMIN 4.0 4.2 3.9   Recent Labs    06/05/19 1822 11/18/19 2153  LIPASE 20 26   Recent Labs    06/16/19 0916  AMMONIA 21   CBC: Recent Labs    11/25/19 0302 11/26/19 0305 11/27/19 0255  WBC 5.5 6.7 6.1  NEUTROABS 3.4 4.4 3.8  HGB 11.1* 12.7* 11.1*  HCT 34.0* 38.2* 33.9*  MCV 92.4 91.0 90.4  PLT 195 239 208   Cardiac Enzymes: Recent Labs    01/20/19 2138  CKTOTAL 73   BNP: Invalid input(s): POCBNP Lab Results  Component Value Date   HGBA1C 6.7 (H) 06/05/2019   Lab Results  Component Value Date   TSH 1.120 06/05/2019   No results found for: VITAMINB12 No results found for: FOLATE No results found for: IRON, TIBC, FERRITIN  Imaging and Procedures obtained prior to SNF admission: CT Head Wo Contrast  Result Date: 11/20/2019 CLINICAL DATA:  Stroke suspected. Additional provided: Altered  mental status. EXAM: CT HEAD WITHOUT CONTRAST TECHNIQUE: Contiguous axial images were obtained from the base of the skull through the vertex without intravenous contrast. COMPARISON:  Prior head CT examinations 11/18/2019 and earlier FINDINGS: Brain: Redemonstrated extensive chronic infarction changes within the left MCA and PCA vascular territories. Ex vacuo dilatation of the left lateral ventricle. Stable background mild-to-moderate generalized parenchymal atrophy and moderate cerebral white matter chronic small vessel ischemic disease. No acute intracranial hemorrhage or acute demarcated cortical infarction is identified. No extra-axial fluid collection. No evidence of  intracranial mass. No midline shift. Vascular: No hyperdense vessel.  Atherosclerotic calcifications. Skull: Normal. Negative for fracture or focal lesion. Sinuses/Orbits: Visualized orbits show no acute finding. No significant paranasal sinus disease or mastoid effusion at the imaged levels. IMPRESSION: No CT evidence of acute intracranial abnormality. Redemonstrated extensive chronic infarction changes within the left MCA and PCA vascular territories. Stable background mild-to-moderate generalized parenchymal atrophy and moderate cerebral white matter chronic small vessel ischemic disease. Electronically Signed   By: Jackey Loge DO   On: 11/20/2019 15:48   DG Chest Portable 1 View  Result Date: 11/20/2019 CLINICAL DATA:  Altered mental status. Increased lethargy. Foul urine odor. EXAM: PORTABLE CHEST 1 VIEW COMPARISON:  11/18/2019 FINDINGS: The heart size and mediastinal contours are within normal limits. Loop recorder overlies the LEFT lung base. Both lungs are clear. The visualized skeletal structures are unremarkable. IMPRESSION: No active disease. Electronically Signed   By: Norva Pavlov M.D.   On: 11/20/2019 15:09   EEG LTVM - Continuous Bedside W/ Video Includes Portable EEG Read  Result Date: 11/20/2019 Charlsie Quest, MD     11/21/2019  7:40 AM Patient Name: Douglas [name]. MRN: [id number] Epilepsy Attending: Charlsie Quest Referring Physician/Provider: Dr Velta Addison Date: 11/20/2019 Duration: 21.23mins Patient history: 69 y.o. 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 Neuro exam with R plegia, not following commands. EEG to evaluate for seizure. Level of alertness: Awake AEDs during EEG study: LTG, Vimpat Technical aspects: This EEG study was done with scalp electrodes positioned according to the  10-20 International system of electrode placement. Electrical activity was acquired at a sampling rate of 500Hz  and reviewed with a high frequency filter of 70Hz  and a low frequency filter of 1Hz . EEG data were recorded continuously and digitally stored. Description: The posterior dominant rhythm consists of 8-9 Hz activity of moderate voltage (25-35 uV) seen predominantly in posterior head regions, asymmetric ( L<R) and reactive to eye opening and eye closing. EEG showed continuous left hemispheric 3 to 6 Hz theta-delta slowing. Hyperventilation and photic stimulation were not performed.   ABNORMALITY -Continuous slow, left hemisphere - Background asymmetry, left<right IMPRESSION: This study is suggestive of cortical dysfunction in left hemisphere likely secondary to underlying infarct and can also be seen in post-ictal state. No seizures or epileptiform discharges were seen throughout the recording.   CUP PACEART REMOTE DEVICE CHECK  Result Date: 11/21/2019 Carelink summary report received. Battery status OK. Normal device function. No new symptom episodes, tachy episodes, brady, or pause episodes. No new AF episodes. Monthly summary reports and ROV/PRN , RN, CCDS, [employer]   Assessment/Plan There are no diagnoses linked to this encounter.   Family/ staff Communication:   Labs/tests ordered:  Netanel Yannuzzi L.  Dwana Garin, D.O. [facility] [address], [place] [address] Cell Phone (Mon-Fri 8am-5pm):  [phone] On Call:  [phone] & follow prompts after 5pm & weekends Office Phone:  [phone] Office Fax:  [phone]

## 2019-12-03 NOTE — Progress Notes (Signed)
note had to be copied from prior one b/c system logged me out and original could not be edited  Provider: Gilmar Bua L. Renato Gails, D.O., C.M.D.  [facility]ing Home Room Number: [phone]  Place of Service: SNF (31)  PCP: Ralene Ok, MD  Patient Care Team:  Ralene Ok, MD as PCP - General (Internal Medicine)  Extended Emergency Contact Information  Primary Emergency Contact: [facility] Phone: [phone]  Mobile Phone: [phone]  Relation: Daughter  Code Status: FULL CODE  Goals of Care: Advanced Directive information  Advanced Directives 11/29/2019  Does Patient Have a Medical Advance Directive? Yes  Type of Advance Directive -  Does patient want to make changes to medical advance directive? No - Patient declined  Copy of Healthcare Power of Attorney in Chart? -  Would patient like information on creating a medical advance directive? -       Chief Complaint  Patient presents with  . New Admit To SNF    New admit to SNF    HPI: Patient is a 69 y.o. 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  He actually presented to the emergency room July 19 with change in mental status. He been living at home with his family and his daughter with providing history in the emergency department on the 19th. She had tried to give him his oral medications and he spit them out. Normally he was quite compliant with taking his medications and calm. He was seen in the ER he was calm and in no distress. He underwent some basic labs and a CT of the head to ensure no acute pathology. CT reveals no evidence of acute intracranial  hemorrhage or infarct. It did show large removed left MCA and PCA territory infarct. He also has extensive intracranial atherosclerotic disease. He also had constipation which was treated.  On July 21 he returned to the emergency room for seizures. First episode had occurred 48 hours prior and lasted for a few seconds with resolution. Next one occurred the following morning when he woke up-- he later slept. When he awoke he was hallucinating about dogs. That is when he was brought to the emergency room. He had yet another CT of his head which was unremarkable. Certain for status epilepticus and the neurologist on-call was consulted and he was given an increased dose of Lamictal to 150 mg. Dose of Vimpat was also given. He was placed on continuous EEG. He failed his swallow study so Vimpat was given IV until the patient can swallow and then Lamictal 150 mg p.o. twice daily was to be started after that. He comes to [place] back on Lamictal 125 mg  His hypertension was managed initially with IV hydralazine.  His diabetes was managed with a sliding scale at the hospital.  Chronic kidney disease stage III was worse at first but improved with hydration.  During his hospitalization he was found to have a right shoulder dislocation. That was consulted and noted it was a pseudosubluxation and did not require treatment or surgical intervention.  He was seen by PT OT at the hospital and they recommended SNF.  Covid test July 21 was negative. He has not  had his Covid vaccines.   When seen, he was sitting up in bed watching TV.  Appeared comfortable but made signals that he wanted his air conditioning adjusted which I did for him.        Past Medical History:  Diagnosis Date  . Acute blood loss anemia   . Acute embolic stroke (HCC)   . Acute lower UTI   . AMS (altered mental status) 06/05/2019  . Cerebral edema (HCC) 04/04/2018  . Diabetes 1.5, managed as type 2 (HCC)   . Diabetes mellitus without complication (HCC)    . History of CVA with residual deficit   . History of Dysphagia, post-stroke   . Hypertension   . Ischemic stroke (HCC) 03/05/2019  . Labile blood glucose   . 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 Renal insufficiency 04/04/2018  . Stroke (HCC)   . UTI (urinary tract infection) 03/05/2019        Past Surgical History:  Procedure Laterality Date  . IR PATIENT EVAL TECH 0-60 MINS  03/30/2018  . LOOP RECORDER INSERTION N/A 03/08/2019   Procedure: LOOP RECORDER INSERTION; Surgeon: Regan Lemming, MD; Location: [facility] INVASIVE CV LAB; Service: Cardiovascular; Laterality: N/A;  . TEE WITHOUT CARDIOVERSION N/A 04/02/2018   Procedure: TRANSESOPHAGEAL ECHOCARDIOGRAM (TEE); Surgeon: [name], MD; Location: [facility] ENDOSCOPY; Service: Cardiovascular; Laterality: N/A;   Social History        Socioeconomic History  . Marital status: Single    Spouse name: Not on file  . Number of children: Not on file  . Years of education: Not on file  . Highest education level: Not on file  Occupational History  . Not on file  Tobacco Use  . Smoking status: Never Smoker  . Smokeless tobacco: Never Used  Vaping Use  . Vaping Use: Never used  Substance and Sexual Activity  . Alcohol use: Not Currently  . Drug use: Never  . Sexual activity: Not Currently  Other Topics Concern  . Not on file  Social History Narrative  . Not on file   Social Determinants of Health      Financial Resource Strain:   . Difficulty of Paying Living Expenses:   Food Insecurity:   . Worried About Programme researcher, broadcasting/film/video in the Last Year:   . Barista in the Last Year:   Transportation Needs:   . Freight forwarder (Medical):   Marland Kitchen Lack of Transportation (Non-Medical):   Physical Activity:   . Days of Exercise per Week:   . Minutes of Exercise per Session:   Stress:   . Feeling of Stress :   Social Connections:   . Frequency of Communication with Friends and Family:   . Frequency of Social  Gatherings with Friends and Family:   . Attends Religious Services:   . Active Member of Clubs or Organizations:   . Attends Banker Meetings:   Marland Kitchen Marital Status:    reports that he has never smoked. He has never used smokeless tobacco. He reports previous alcohol use. He reports that he does not use drugs.  Functional Status Survey:        Family History  Problem Relation Age of Onset  . Hypertension Mother   . Hypertension Father        Health Maintenance  Topic Date Due  . Hepatitis C Screening  Never done  . FOOT EXAM  Never done  . OPHTHALMOLOGY EXAM  Never done  . COVID-19  Vaccine (1) Never done  . TETANUS/TDAP  Never done  . COLONOSCOPY  Never done  . PNA vac Low Risk Adult (2 of 2 - PCV13) 01/01/2019  . INFLUENZA VACCINE  12/01/2019  . HEMOGLOBIN A1C  12/03/2019        Allergies  Allergen Reactions  . Ciprofloxacin Anaphylaxis  . Keppra [Levetiracetam] Other (See Comments)    Makes the patient very agitated and "not like himself"  . Avocado Nausea And Vomiting  . Metformin And Related Other (See Comments)    Renal issue       Outpatient Encounter Medications as of 11/29/2019  Medication Sig  . AMLODIPINE BESYLATE PO Take 10 mg by mouth daily.  Marland Kitchen ascorbic acid (VITAMIN C) 500 MG tablet Take 1 tablet (500 mg total) by mouth daily as needed (if cold-like symptoms are present).  Marland Kitchen aspirin EC 325 MG tablet Take 325 mg by mouth every morning.  Marland Kitchen atorvastatin (LIPITOR) 40 MG tablet Take 40 mg by mouth daily at 6 (six) AM.  . [EXPIRED] baclofen (LIORESAL) 20 MG tablet Take 20 mg by mouth. Take 1 tablet by mouth in the morning and at bedtime 5 days for muscle spasm  . bisacodyl (DULCOLAX) 10 MG suppository Place 1 suppository (10 mg total) rectally 2 (two) times daily.  . carvedilol (COREG) 12.5 MG tablet Take 12.5 mg by mouth 2 (two) times daily with a meal.  . clopidogrel (PLAVIX) 75 MG tablet Take 1 tablet (75 mg total) by mouth daily.  Marland Kitchen glipiZIDE  (GLUCOTROL) 10 MG tablet Take 10 mg by mouth 2 (two) times daily with a meal.  . indapamide (LOZOL) 1.25 MG tablet Take 1 tablet (1.25 mg total) by mouth daily.  . insulin aspart (NOVOLOG) 100 UNIT/ML injection Inject 2-15 Units into the skin 4 (four) times daily - before meals and at bedtime. SSI: 121-150 = 2 units; 151-200 = 3 units, 201-250 = 5 units, 251-300 = 8 units, 301-350 = 11 units, 351-300 = 15 units, greater than 400 = 15 units and call MD  . insulin detemir (LEVEMIR) 100 UNIT/ML injection Inject 0.16 mLs (16 Units total) into the skin daily. If CBG >125 (per daughter)  . lamoTRIgine (LAMICTAL) 150 MG tablet Take 1 tablet (150 mg total) by mouth 2 (two) times daily.  Marland Kitchen lisinopril (ZESTRIL) 20 MG tablet Take 1 tablet (20 mg total) by mouth 2 (two) times daily.  . midodrine (PROAMATINE) 5 MG tablet Take 1 tablet (5 mg total) by mouth in the morning, at noon, and at bedtime. FOR ORTHOSTATIC HYPOTENSION  . Nystatin (GERHARDT'S BUTT CREAM) CREA Apply 1 application topically daily.  . pantoprazole (PROTONIX) 40 MG tablet Take 1 tablet (40 mg total) by mouth daily.  Marland Kitchen senna-docusate (SENOKOT-S) 8.6-50 MG tablet Take 1 tablet by mouth daily.  . tamsulosin (FLOMAX) 0.4 MG CAPS capsule Take 1 capsule (0.4 mg total) by mouth daily after supper.  . Vitamin D3 (VITAMIN D) 25 MCG tablet Take 0.5 tablets (500 Units total) by mouth daily.  . [DISCONTINUED] amLODipine (NORVASC) 10 MG tablet Take 1 tablet (10 mg total) by mouth daily.  . [DISCONTINUED] atorvastatin (LIPITOR) 40 MG tablet Take 1 tablet (40 mg total) by mouth daily at 6 PM. (Patient taking differently: Take 40 mg by mouth at bedtime. )  . [DISCONTINUED] baclofen (LIORESAL) 10 MG tablet Take 2 tablets (20 mg total) by mouth in the morning and at bedtime for 5 days.  . [DISCONTINUED] carvedilol (COREG) 12.5 MG tablet Take 1  tablet (12.5 mg total) by mouth 2 (two) times daily with a meal. (Patient taking differently: Take 12.5 mg by mouth in the  morning and at bedtime. )  . [DISCONTINUED] Cholecalciferol (VITAMIN D3 PO) Take 1 tablet by mouth daily.   . [DISCONTINUED] liver oil-zinc oxide (DESITIN) 40 % ointment Apply topically 2 (two) times daily as needed for irritation (for protection). (Patient not taking: Reported on 11/18/2019)   No facility-administered encounter medications on file as of 11/29/2019.   Review of Systems  Unable to perform ROS: Patient nonverbal (did deny pain, nursing noted no concerns when spoke with them)       Vitals:   11/29/19 1418  BP: (!) 130/77  Pulse: 75  Temp: (!) 97.2 F (36.2 C)  Weight: 171 lb 14.4 oz (78 kg)  Height: 6' (1.829 m)   Body mass index is 23.31 kg/m.  Physical Exam Vitals reviewed.  Constitutional:      General: He is not in acute distress.    Appearance: Normal appearance. He is not ill-appearing or toxic-appearing.  HENT:     Head: Normocephalic and atraumatic.     Right Ear: External ear normal.     Left Ear: External ear normal.     Nose: Nose normal.     Mouth/Throat:     Pharynx: Oropharynx is clear.  Eyes:     Extraocular Movements: Extraocular movements intact.     Conjunctiva/sclera: Conjunctivae normal.     Pupils: Pupils are equal, round, and reactive to light.  Cardiovascular:     Rate and Rhythm: Normal rate and regular rhythm.  Pulmonary:     Effort: Pulmonary effort is normal.     Breath sounds: Normal breath sounds. No wheezing, rhonchi or rales.  Abdominal:     General: Bowel sounds are normal.     Palpations: Abdomen is soft.     Tenderness: There is no abdominal tenderness. There is no guarding or rebound.  Musculoskeletal:     Cervical back: Neck supple.     Right lower leg: No edema.     Left lower leg: No edema.     Comments: Right hemiplegia, sitting up in bed  Lymphadenopathy:     Cervical: No cervical adenopathy.  Skin:    General: Skin is warm and dry.  Neurological:     Mental Status: He is alert.     Motor: Weakness present.       Gait: Gait abnormal.     Comments: Right hemiplegia, global aphasia, using hand signals to communicate some  Psychiatric:        Mood and Affect: Mood normal.      Labs reviewed:  Basic Metabolic Panel:  Recent Labs (within last 365 days)                                                                                                                                Liver Function Tests:  Recent Labs (within last 365 days)                                                      Recent Labs (within last 365 days)                     Recent Labs (within last 365 days)                 CBC:  Recent Labs (within last 365 days)                                                     Cardiac Enzymes:  Recent Labs (within last 365 days)                 BNP:  Recent Labs (within last 365 days)     Recent Labs                       Recent Labs                       Recent Labs     Recent Labs     Recent Labs    Imaging and Procedures obtained prior to SNF admission:  CT Head Wo Contrast  Result Date: 11/20/2019  CLINICAL DATA: Stroke suspected. Additional provided: Altered mental status. EXAM: CT HEAD WITHOUT CONTRAST TECHNIQUE: Contiguous axial images were obtained from the base of the skull through the vertex without intravenous contrast. COMPARISON: Prior head CT examinations 11/18/2019 and earlier FINDINGS: Brain: Redemonstrated extensive chronic infarction changes within the left MCA and PCA vascular territories. Ex vacuo dilatation of the left lateral ventricle. Stable background mild-to-moderate generalized parenchymal atrophy and moderate cerebral white matter chronic small vessel ischemic disease. No acute intracranial hemorrhage or acute demarcated cortical infarction is identified. No extra-axial fluid collection. No evidence of intracranial mass. No midline shift. Vascular: No hyperdense vessel. Atherosclerotic calcifications.  Skull: Normal. Negative for fracture or focal lesion. Sinuses/Orbits: Visualized orbits show no acute finding. No significant paranasal sinus disease or mastoid effusion at the imaged levels. IMPRESSION: No CT evidence of acute intracranial abnormality. Redemonstrated extensive chronic infarction changes within the left MCA and PCA vascular territories. Stable background mild-to-moderate generalized parenchymal atrophy and moderate cerebral white matter chronic small vessel ischemic disease. Electronically Signed By: Jackey Loge DO On: 11/20/2019 15:48  DG Chest Portable 1 View  Result Date: 11/20/2019  CLINICAL DATA: Altered mental status. Increased lethargy. Foul urine odor. EXAM: PORTABLE CHEST 1 VIEW COMPARISON: 11/18/2019 FINDINGS: The heart size and mediastinal contours are within normal limits. Loop recorder overlies the LEFT lung base. Both lungs are clear. The visualized skeletal structures are unremarkable. IMPRESSION: No active disease. Electronically Signed By: Norva Pavlov M.D. On: 11/20/2019 15:09  EEG LTVM - Continuous Bedside W/ Video Includes Portable EEG Read  Result Date: 11/20/2019  Charlsie Quest, MD 11/21/2019 7:40 AM Patient Name: Douglas [name]. MRN: [id number] Epilepsy Attending: Charlsie Quest Referring Physician/Provider: Dr Velta Addison Date: 11/20/2019 Duration: 21.29mins Patient history: 69 y.o. male with PMH significant for prior L MCA and PCA stroke with  residual aphasia and R hemiplegia, hx of seizures on lamictal 125mg  BID who presents with a prolonged episode of starring off and lethargic over the last 2 days. Neuro exam with R plegia, not following commands. EEG to evaluate for seizure. Level of alertness: Awake AEDs during EEG study: LTG, Vimpat Technical aspects: This EEG study was done with scalp electrodes positioned according to the 10-20 International system of electrode placement. Electrical activity was acquired at a sampling rate of 500Hz  and reviewed  with a high frequency filter of 70Hz  and a low frequency filter of 1Hz . EEG data were recorded continuously and digitally stored. Description: The posterior dominant rhythm consists of 8-9 Hz activity of moderate voltage (25-35 uV) seen predominantly in posterior head regions, asymmetric ( L<R) and reactive to eye opening and eye closing. EEG showed continuous left hemispheric 3 to 6 Hz theta-delta slowing. Hyperventilation and photic stimulation were not performed. ABNORMALITY -Continuous slow, left hemisphere - Background asymmetry, left<right IMPRESSION: This study is suggestive of cortical dysfunction in left hemisphere likely secondary to underlying infarct and can also be seen in post-ictal state. No seizures or epileptiform discharges were seen throughout the recording. Charlsie QuestPriyanka O Yadav  CUP PACEART REMOTE DEVICE CHECK  Result Date: 11/21/2019  [facility] summary report received. Battery status OK. Normal device function. No new symptom episodes, tachy episodes, brady, or pause episodes. No new AF episodes. Monthly summary reports and ROV/PRN Hassell HalimMary Jo Lemke, RN, CCDS, [employer]    Assessment/Plan: 1. Partial symptomatic epilepsy with complex partial seizures, not intractable, without status epilepticus (HCC) -cont lamictal -monitor cbc due to this med  2. 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 Global aphasia -cont ST, due to prior stroke   4. BPH with obstruction/lower urinary tract symptoms -cont flomax, monitor orthostatics as ordered  5. Type 2 diabetes mellitus with peripheral neuropathy (HCC) -well controlled Lab Results  Component Value Date   HGBA1C 6.7 (H) 06/05/2019  -if eating well and not having lows, glipizide ok, but if intake is poor of low glucose detected, would d/c glipizide in favor of an alternative if affordable -also on levemir 16 units qhs -has  SSI, need to look at CBGs after a week or two and determine whether some scheduled mealtime insulin is indicated for him--if running under 150 consistently, would d/c SSI and check CBGs only bid not ac + hs  6. Hyperlipidemia associated with type 2 diabetes mellitus (HCC) -continues on lipitor 40mg  daily -at goal Lab Results  Component Value Date   LDLCALC 68 03/06/2019    7. Hypertension associated with diabetes (HCC) -remains on lisinopril, amlodipine, indapamide also -monitor hydration--if poor, may need to d/c or decrease indapamide  8. Delayed orthostatic hypotension -continue to monitor for this and maintain on low dose midodrine right now--no reports of a concern here thus far -flomax could contribute, but needed for bph  9. Full code status - Full code  Goal is to return home with his daughter and caregiver  Discussed with SNF nurse Labs/Tests:  Cbc, bmp  Crystol Walpole L. Nelson Noone, D.O. Geriatrics [facility] [address] [place], [place] [address] Cell Phone (Mon-Fri 8am-5pm):  [phone] On Call:  [phone] & follow prompts after 5pm & weekends Office Phone:  [phone] Office Fax:  [phone]

## 2019-12-09 LAB — BASIC METABOLIC PANEL
BUN: 14 (ref 4–21)
CO2: 24 — AB (ref 13–22)
Chloride: 101 (ref 99–108)
Creatinine: 1.1 (ref 0.6–1.3)
Glucose: 100
Potassium: 3.7 (ref 3.4–5.3)
Sodium: 139 (ref 137–147)

## 2019-12-09 LAB — CBC AND DIFFERENTIAL
HCT: 36 — AB (ref 41–53)
Hemoglobin: 12.4 — AB (ref 13.5–17.5)
Platelets: 225 (ref 150–399)
WBC: 5.2

## 2019-12-09 LAB — COMPREHENSIVE METABOLIC PANEL
Calcium: 9.4 (ref 8.7–10.7)
GFR calc Af Amer: 76.16
GFR calc non Af Amer: 65.71

## 2019-12-09 LAB — CBC: RBC: 4.02 (ref 3.87–5.11)

## 2019-12-24 ENCOUNTER — Encounter: Payer: Self-pay | Admitting: Internal Medicine

## 2019-12-25 ENCOUNTER — Non-Acute Institutional Stay (SKILLED_NURSING_FACILITY): Payer: Medicare Other | Admitting: Family

## 2019-12-25 ENCOUNTER — Encounter: Payer: Self-pay | Admitting: Family

## 2019-12-25 DIAGNOSIS — I69351 Hemiplegia and hemiparesis following cerebral infarction affecting right dominant side: Secondary | ICD-10-CM

## 2019-12-25 DIAGNOSIS — K5901 Slow transit constipation: Secondary | ICD-10-CM

## 2019-12-25 DIAGNOSIS — R2681 Unsteadiness on feet: Secondary | ICD-10-CM

## 2019-12-25 DIAGNOSIS — E1142 Type 2 diabetes mellitus with diabetic polyneuropathy: Secondary | ICD-10-CM

## 2019-12-25 DIAGNOSIS — R4701 Aphasia: Secondary | ICD-10-CM

## 2019-12-25 DIAGNOSIS — E1159 Type 2 diabetes mellitus with other circulatory complications: Secondary | ICD-10-CM

## 2019-12-25 DIAGNOSIS — E1169 Type 2 diabetes mellitus with other specified complication: Secondary | ICD-10-CM

## 2019-12-25 DIAGNOSIS — I6990 Unspecified sequelae of unspecified cerebrovascular disease: Secondary | ICD-10-CM

## 2019-12-25 DIAGNOSIS — G40209 Localization-related (focal) (partial) symptomatic epilepsy and epileptic syndromes with complex partial seizures, not intractable, without status epilepticus: Secondary | ICD-10-CM

## 2019-12-25 DIAGNOSIS — I10 Essential (primary) hypertension: Secondary | ICD-10-CM

## 2019-12-25 DIAGNOSIS — I951 Orthostatic hypotension: Secondary | ICD-10-CM

## 2019-12-25 DIAGNOSIS — E785 Hyperlipidemia, unspecified: Secondary | ICD-10-CM

## 2019-12-25 DIAGNOSIS — N138 Other obstructive and reflux uropathy: Secondary | ICD-10-CM

## 2019-12-25 DIAGNOSIS — N401 Enlarged prostate with lower urinary tract symptoms: Secondary | ICD-10-CM

## 2019-12-25 NOTE — Progress Notes (Signed)
Location:  [facility] Room Number: 504-P Place of Service:  SNF [phone])  Provider: Richarda Blade FNP-C   PCP: Ralene Ok, MD Patient Care Team: Ralene Ok, MD as PCP - General (Internal Medicine)  Extended Emergency Contact Information Primary Emergency Contact: [facility] Phone: [phone] Mobile Phone: [phone] Relation: Daughter  Code Status: Full Code  Goals of care:  Advanced Directive information Advanced Directives 12/25/2019  Does Patient Have a Medical Advance Directive? Yes  Type of Advance Directive -  Does patient want to make changes to medical advance directive? No - Patient declined  Copy of Healthcare Power of Attorney in Chart? -  Would patient like information on creating a medical advance directive? -     Allergies  Allergen Reactions  . Ciprofloxacin Anaphylaxis  . Keppra [Levetiracetam] Other (See Comments)    Makes the patient very agitated and "not like himself"  . Avocado Nausea And Vomiting  . Metformin And Related Other (See Comments)    Renal issue    Chief Complaint  Patient presents with  . Discharge Note    Discharge from SNF    HPI:  68 y.o. male seen today at [facility] for discharge home.He is seen up in the bed watching TV. He was here for short term rehabilitation for post hospital admission from 11/20/2019 - 11/27/2019 for seizures.He was seen in the ED 11/18/2019 for mental status change after the daughter tried to give him medication at home and he spat them out.He was compliant with his medication prior to this.He was calm and had no distress in the ED.Labs were unremarkable.CT of the head was done showed no acute intracranial hemorrhage or infarct.It did show large removed left MCA and PCA territory infarct.Also had extensive intracranial atherosclerotic disease.He was also treated for constipation.He was discharge home with daughter.He returned to ED on 11/20/2019  for seizures which had occurred 48 hrs prior to ED.first seizure activity had lasted few minutes then went away.2nd seizure occurred when he woke up.he was noted to hallucinate about dogs that when daughter took him to ED.Head CT scan was repeated which was unremarkable.Neurology was consulted and Lamictal was increased to 150 mg.VIMPAT was also given I.V since he could not swallow.He was discharged on Lamictal 125 mg tablet.He was also treated with I.V hydralazine for hypertension He was found to have a right shoulder dislocation though it was noted to be pseudosubluxation and did not require surgery.He was seen by PT/OT and SNF was recommended.He has a medical history of Hypertension,Hyperlipidemia,type 2 DM,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  His fasting CBG readings ranging in the 90's - 170's and post pradial are 100's -200's with x 1 episode noted in the 300's.  Daughter requested for right hip-Pelvic X-ray to be done last night 12/24/2019  results was negative.No signs of pain reported.      He has worked well with PT/OT now stable for discharge home with dauhgtrer.He will be discharged home with Home health PT/OT to continue with ROM, Exercise, Gait stability and muscle strengthening. He does not require any new has own rolling walker to allow her to maintain current level of independence with ADL's. Home health services will be arranged by facility social worker prior to discharge. Prescription medication will be written x 1 month then patient to follow up with PCP in 1-2 weeks. He communicates by  using his hands during visit. Facility staff report no new concerns.    Past Medical History:  Diagnosis Date  . Acute blood loss anemia   . Acute embolic stroke (HCC)   . Acute lower UTI   . AMS (altered mental status) 06/05/2019  .  Cerebral edema (HCC) 04/04/2018  . Diabetes 1.5, managed as type 2 (HCC)   . Diabetes mellitus without complication (HCC)   . History of CVA with residual deficit   . History of Dysphagia, post-stroke   . Hypertension   . Ischemic stroke (HCC) 03/05/2019  . Labile blood glucose   . 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 Renal insufficiency 04/04/2018  . Stroke (HCC)   . UTI (urinary tract infection) 03/05/2019    Past Surgical History:  Procedure Laterality Date  . IR PATIENT EVAL TECH 0-60 MINS  03/30/2018  . LOOP RECORDER INSERTION N/A 03/08/2019   Procedure: LOOP RECORDER INSERTION;  Surgeon: Regan Lemming, MD;  Location: [facility] INVASIVE CV LAB;  Service: Cardiovascular;  Laterality: N/A;  . TEE WITHOUT CARDIOVERSION N/A 04/02/2018   Procedure: TRANSESOPHAGEAL ECHOCARDIOGRAM (TEE);  Surgeon: [name], MD;  Location: [facility] ENDOSCOPY;  Service: Cardiovascular;  Laterality: N/A;      reports that he has never smoked. He has never used smokeless tobacco. He reports previous alcohol use. He reports that he does not use drugs. Social History   Socioeconomic History  . Marital status: Single    Spouse name: Not on file  . Number of children: Not on file  . Years of education: Not on file  . Highest education level: Not on file  Occupational History  . Not on file  Tobacco Use  . Smoking status: Never Smoker  . Smokeless tobacco: Never Used  Vaping Use  . Vaping Use: Never used  Substance and Sexual Activity  . Alcohol use: Not Currently  . Drug use: Never  . Sexual activity: Not Currently  Other Topics Concern  . Not on file  Social History Narrative  . Not on file   Social Determinants of Health   Financial Resource Strain:   . Difficulty of Paying Living Expenses: Not on file  Food Insecurity:   . Worried About Programme researcher, broadcasting/film/video in the Last Year: Not on file  . Ran Out of Food in the Last Year: Not on file  Transportation Needs:   . Lack of  Transportation (Medical): Not on file  . Lack of Transportation (Non-Medical): Not on file  Physical Activity:   . Days of Exercise per Week: Not on file  . Minutes of Exercise per Session: Not on file  Stress:   . Feeling of Stress : Not on file  Social Connections:   . Frequency of Communication with Friends and Family: Not on file  . Frequency of Social Gatherings with Friends and Family: Not on file  . Attends Religious Services: Not on file  . Active Member of Clubs or Organizations: Not on file  . Attends Banker Meetings: Not on file  . Marital Status: Not on file  Intimate Partner Violence:   . Fear of Current or Ex-Partner: Not on file  . Emotionally Abused: Not on file  . Physically Abused: Not on file  . Sexually Abused: Not on file    Allergies  Allergen Reactions  . Ciprofloxacin Anaphylaxis  . Keppra [Levetiracetam] Other (See Comments)    Makes the patient very agitated and "not like himself"  .  Avocado Nausea And Vomiting  . Metformin And Related Other (See Comments)    Renal issue    Pertinent  Health Maintenance Due  Topic Date Due  . FOOT EXAM  Never done  . OPHTHALMOLOGY EXAM  Never done  . COLONOSCOPY  Never done  . PNA vac Low Risk Adult (2 of 2 - PCV13) 01/01/2019  . INFLUENZA VACCINE  12/01/2019  . HEMOGLOBIN A1C  12/03/2019    Medications: Outpatient Encounter Medications as of 12/25/2019  Medication Sig  . ascorbic acid (VITAMIN C) 500 MG tablet Take 1 tablet (500 mg total) by mouth daily as needed (if cold-like symptoms are present).  Marland Kitchen aspirin EC 325 MG tablet Take 325 mg by mouth every morning.  Marland Kitchen atorvastatin (LIPITOR) 40 MG tablet Take 1 tablet (40 mg total) by mouth daily at 6 (six) AM.  . bisacodyl (DULCOLAX) 10 MG suppository Place 1 suppository (10 mg total) rectally as needed for moderate constipation.  . carvedilol (COREG) 12.5 MG tablet Take 1 tablet (12.5 mg total) by mouth 2 (two) times daily with a meal.  .  clopidogrel (PLAVIX) 75 MG tablet Take 1 tablet (75 mg total) by mouth daily.  Marland Kitchen glipiZIDE (GLUCOTROL) 10 MG tablet Take 1 tablet (10 mg total) by mouth 2 (two) times daily with a meal.  . indapamide (LOZOL) 1.25 MG tablet Take 1 tablet (1.25 mg total) by mouth daily.  . insulin aspart (NOVOLOG) 100 UNIT/ML injection Inject 2-15 Units into the skin 4 (four) times daily -  before meals and at bedtime. SSI:  121-150 = 2 units; 151-200 = 3 units, 201-250 = 5 units, 251-300 = 8 units, 301-350 = 11 units, 351-300 = 15 units, greater than 400 = 15 units and call MD  . insulin detemir (LEVEMIR) 100 UNIT/ML injection Inject 0.16 mLs (16 Units total) into the skin daily. If CBG >125 (per daughter)  . lamoTRIgine (LAMICTAL) 150 MG tablet Take 1 tablet (150 mg total) by mouth 2 (two) times daily.  Marland Kitchen lisinopril (ZESTRIL) 20 MG tablet Take 1 tablet (20 mg total) by mouth 2 (two) times daily.  Marland Kitchen liver oil-zinc oxide (DESITIN) 40 % ointment Apply 1 application topically 2 (two) times daily as needed for irritation.  . midodrine (PROAMATINE) 5 MG tablet Take 1 tablet (5 mg total) by mouth in the morning, at noon, and at bedtime. FOR ORTHOSTATIC HYPOTENSION  . pantoprazole (PROTONIX) 40 MG tablet Take 1 tablet (40 mg total) by mouth daily.  Marland Kitchen senna-docusate (SENOKOT-S) 8.6-50 MG tablet Take 1 tablet by mouth daily.  . tamsulosin (FLOMAX) 0.4 MG CAPS capsule Take 1 capsule (0.4 mg total) by mouth daily after supper.  . Vitamin D3 (VITAMIN D) 25 MCG tablet Take 0.5 tablets (500 Units total) by mouth daily.  Marland Kitchen amLODipine (NORVASC) 10 MG tablet Take 1 tablet (10 mg total) by mouth daily.  . [DISCONTINUED] AMLODIPINE BESYLATE PO Take 10 mg by mouth daily.  . [DISCONTINUED] atorvastatin (LIPITOR) 40 MG tablet Take 40 mg by mouth daily at 6 (six) AM.  . [DISCONTINUED] bisacodyl (DULCOLAX) 10 MG suppository Place 1 suppository (10 mg total) rectally 2 (two) times daily.  . [DISCONTINUED] bisacodyl (DULCOLAX) 10 MG  suppository Place 10 mg rectally as needed for moderate constipation.  . [DISCONTINUED] carvedilol (COREG) 12.5 MG tablet Take 12.5 mg by mouth 2 (two) times daily with a meal.  . [DISCONTINUED] clopidogrel (PLAVIX) 75 MG tablet Take 1 tablet (75 mg total) by mouth daily.  . [DISCONTINUED] glipiZIDE (  GLUCOTROL) 10 MG tablet Take 10 mg by mouth 2 (two) times daily with a meal.  . [DISCONTINUED] indapamide (LOZOL) 1.25 MG tablet Take 1 tablet (1.25 mg total) by mouth daily.  . [DISCONTINUED] insulin aspart (NOVOLOG) 100 UNIT/ML injection Inject 2-15 Units into the skin 4 (four) times daily -  before meals and at bedtime. SSI:  121-150 = 2 units; 151-200 = 3 units, 201-250 = 5 units, 251-300 = 8 units, 301-350 = 11 units, 351-300 = 15 units, greater than 400 = 15 units and call MD  . [DISCONTINUED] insulin detemir (LEVEMIR) 100 UNIT/ML injection Inject 0.16 mLs (16 Units total) into the skin daily. If CBG >125 (per daughter)  . [DISCONTINUED] lamoTRIgine (LAMICTAL) 150 MG tablet Take 1 tablet (150 mg total) by mouth 2 (two) times daily.  . [DISCONTINUED] lisinopril (ZESTRIL) 20 MG tablet Take 1 tablet (20 mg total) by mouth 2 (two) times daily.  . [DISCONTINUED] liver oil-zinc oxide (DESITIN) 40 % ointment Apply 1 application topically 2 (two) times daily as needed for irritation.  . [DISCONTINUED] midodrine (PROAMATINE) 5 MG tablet Take 1 tablet (5 mg total) by mouth in the morning, at noon, and at bedtime. FOR ORTHOSTATIC HYPOTENSION  . [DISCONTINUED] Nystatin (GERHARDT'S BUTT CREAM) CREA Apply 1 application topically daily.  . [DISCONTINUED] pantoprazole (PROTONIX) 40 MG tablet Take 1 tablet (40 mg total) by mouth daily.  . [DISCONTINUED] senna-docusate (SENOKOT-S) 8.6-50 MG tablet Take 1 tablet by mouth daily.  . [DISCONTINUED] tamsulosin (FLOMAX) 0.4 MG CAPS capsule Take 1 capsule (0.4 mg total) by mouth daily after supper.   No facility-administered encounter medications on file as of 12/25/2019.       Review of Systems  Unable to perform ROS: Patient nonverbal (additional infromation provided by facility Nurse )    Vitals:   12/25/19 0951  BP: (!) 141/69  Pulse: 92  Resp: 18  Temp: 98 F (36.7 C)  Weight: 171 lb (77.6 kg)  Height: 6' (1.829 m)   Body mass index is 23.19 kg/m. Physical Exam Vitals and nursing note reviewed.  Constitutional:      General: He is not in acute distress.    Appearance: He is normal weight. He is not ill-appearing.  HENT:     Head: Normocephalic.     Nose: Nose normal. No congestion or rhinorrhea.  Eyes:     General: No scleral icterus.       Right eye: No discharge.        Left eye: No discharge.     Conjunctiva/sclera: Conjunctivae normal.     Pupils: Pupils are equal, round, and reactive to light.  Cardiovascular:     Rate and Rhythm: Normal rate and regular rhythm.     Pulses: Normal pulses.     Heart sounds: Normal heart sounds. No murmur heard.  No friction rub. No gallop.   Pulmonary:     Effort: Pulmonary effort is normal. No respiratory distress.     Breath sounds: Normal breath sounds. No wheezing, rhonchi or rales.  Chest:     Chest wall: No tenderness.  Abdominal:     General: Bowel sounds are normal. There is no distension.     Palpations: Abdomen is soft. There is no mass.     Tenderness: There is no abdominal tenderness. There is no right CVA tenderness, left CVA tenderness, guarding or rebound.  Musculoskeletal:        General: No swelling or tenderness.     Cervical back: Normal range  of motion. No rigidity or tenderness.     Right lower leg: No edema.     Left lower leg: No edema.     Comments: Right side hemiplegic   Lymphadenopathy:     Cervical: No cervical adenopathy.  Skin:    General: Skin is warm and dry.     Coloration: Skin is not pale.     Findings: No bruising, erythema or rash.     Comments: Sacral area previous healed pressure ulcer site pink- whitish in color.  Neurological:     Mental  Status: He is alert.     Gait: Gait abnormal.     Comments: Nonverbal communicates with his hands.Follows simple commands.   Psychiatric:        Mood and Affect: Mood normal.        Speech: He is noncommunicative.        Behavior: Behavior normal.    Labs reviewed: Basic Metabolic Panel: Recent Labs    06/05/19 1822 06/06/19 0441 11/21/19 0147 11/21/19 0326 11/22/19 1145 11/23/19 0255 12/09/19 0000  NA  --    < > 143   < > 145 143 139  K  --    < > 3.7   < > 3.8 4.6 3.7  CL  --    < > 109   < > 113* 112* 101  CO2  --    < > 25   < > 21* 22 24*  GLUCOSE  --    < > 121*  --  180* 90  --   BUN  --    < > 24*   < > 16 10 14   CREATININE 0.90   < > 1.22   < > 1.11 0.90 1.1  CALCIUM 9.3   < > 9.5   < > 9.3 9.2 9.4  MG 2.1  --  2.0  --   --   --   --   PHOS 2.9  --   --   --   --   --   --    < > = values in this interval not displayed.   Liver Function Tests: Recent Labs    11/18/19 2153 11/20/19 1802 11/21/19 0147  AST 20 21 15   ALT 28 25 23   ALKPHOS 95 89 82  BILITOT 0.7 1.1 1.0  PROT 7.2 7.1 6.8  ALBUMIN 4.0 4.2 3.9   Recent Labs    06/05/19 1822 11/18/19 2153  LIPASE 20 26   Recent Labs    06/16/19 0916  AMMONIA 21   CBC: Recent Labs    11/25/19 0302 11/25/19 0302 11/26/19 0305 11/27/19 0255 12/09/19 0000  WBC 5.5   < > 6.7 6.1 5.2  NEUTROABS 3.4  --  4.4 3.8  --   HGB 11.1*   < > 12.7* 11.1* 12.4*  HCT 34.0*   < > 38.2* 33.9* 36*  MCV 92.4  --  91.0 90.4  --   PLT 195   < > 239 208 225   < > = values in this interval not displayed.   Cardiac Enzymes: Recent Labs    01/20/19 2138  CKTOTAL 73   CBG: Recent Labs    11/27/19 0357 11/27/19 0753 11/27/19 1202  GLUCAP 82 106* 140*    Procedures and Imaging Studies During Stay: No results found.  Assessment/Plan:      1. Unsteady gait Has workedl with PT/ OT.He will discharge home PT/OT to continue with ROM, Exercise,  Gait stability and muscle strengthening.No DME required.Fall and  safety precautions.  2. Type 2 diabetes mellitus with peripheral neuropathy (HCC) No Hgb A1C for review will defer to PCP   CBG reviewed not controlled. Continue on glipizide,Novolog SSI and Levemir.consider D/c Novolog SSI and giving 5 units if CBG > 150 and 8 units if > than 250 three times with meals.  - glipiZIDE (GLUCOTROL) 10 MG tablet; Take 1 tablet (10 mg total) by mouth 2 (two) times daily with a meal.  Dispense: 60 tablet; Refill: 0 - insulin aspart (NOVOLOG) 100 UNIT/ML injection; Inject 2-15 Units into the skin 4 (four) times daily -  before meals and at bedtime. SSI:  121-150 = 2 units; 151-200 = 3 units, 201-250 = 5 units, 251-300 = 8 units, 301-350 = 11 units, 351-300 = 15 units, greater than 400 = 15 units and call MD  Dispense: 20 mL; Refill: 0 - insulin detemir (LEVEMIR) 100 UNIT/ML injection; Inject 0.16 mLs (16 Units total) into the skin daily. If CBG >125 (per daughter)  Dispense: 10 mL; Refill: 0 - lisinopril (ZESTRIL) 20 MG tablet; Take 1 tablet (20 mg total) by mouth 2 (two) times daily.  Dispense: 60 tablet; Refill: 0  3. Partial symptomatic epilepsy with complex partial seizures, not intractable, without status epilepticus (HCC) No recent seizure activity.continue on Lamictal. - lamoTRIgine (LAMICTAL) 150 MG tablet; Take 1 tablet (150 mg total) by mouth 2 (two) times daily.  Dispense: 60 tablet; Refill: 0 - CBC in 1-2 weeks with PCP   4. 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 Global aphasia Hx of stroke has worked with Speech therapy. - continue on Aspiration precaution  6. Hyperlipidemia associated with type 2 diabetes mellitus (HCC) No recent LDL for review will defer to PCP - continue on atorvastatin and heart healthy diet.  - atorvastatin (LIPITOR) 40 MG tablet; Take 1 tablet (40 mg total) by mouth daily at 6 (six) AM.  Dispense: 30  tablet; Refill: 0  7. Hypertension associated with diabetes (HCC) B/p stable.continue on amlodipine,Coreg,indapamide and Lisinopril  - amLODipine (NORVASC) 10 MG tablet; Take 1 tablet (10 mg total) by mouth daily.  Dispense: 30 tablet; Refill: 0 - atorvastatin (LIPITOR) 40 MG tablet; Take 1 tablet (40 mg total) by mouth daily at 6 (six) AM.  Dispense: 30 tablet; Refill: 0 - carvedilol (COREG) 12.5 MG tablet; Take 1 tablet (12.5 mg total) by mouth 2 (two) times daily with a meal.  Dispense: 60 tablet; Refill: 0 - indapamide (LOZOL) 1.25 MG tablet; Take 1 tablet (1.25 mg total) by mouth daily.  Dispense: 30 tablet; Refill: 0 - lisinopril (ZESTRIL) 20 MG tablet; Take 1 tablet (20 mg total) by mouth 2 (two) times daily.  Dispense: 60 tablet; Refill: 0  8. Delayed orthostatic hypotension Asymptomatic this visit.continue on Midodrine  - midodrine (PROAMATINE) 5 MG tablet; Take 1 tablet (5 mg total) by mouth in the morning, at noon, and at bedtime. FOR ORTHOSTATIC HYPOTENSION  Dispense: 90 tablet; Refill: 0  9. Slow transit constipation Current regimen effective.continue on Senokot -S and bisacodyl. Continue to encourage oral intake and hydration. - bisacodyl (DULCOLAX) 10 MG suppository; Place 1 suppository (10 mg total) rectally as needed for moderate constipation.  Dispense: 12 suppository; Refill: 0 - senna-docusate (SENOKOT-S) 8.6-50 MG tablet; Take 1 tablet by mouth daily.  Dispense: 30 tablet; Refill: 0  10. Late effects of CVA (cerebrovascular accident) Continue with PT/OT and supportive care. - amLODipine (NORVASC) 10 MG tablet; Take 1 tablet (10 mg total) by mouth daily.  Dispense: 30 tablet; Refill: 0 - atorvastatin (LIPITOR) 40 MG tablet; Take 1 tablet (40 mg total) by mouth daily at 6 (six) AM.  Dispense: 30 tablet; Refill: 0 - carvedilol (COREG) 12.5 MG tablet; Take 1 tablet (12.5 mg total) by mouth 2 (two) times daily with a meal.  Dispense: 60 tablet; Refill: 0 - clopidogrel  (PLAVIX) 75 MG tablet; Take 1 tablet (75 mg total) by mouth daily.  Dispense: 30 tablet; Refill: 0 - lisinopril (ZESTRIL) 20 MG tablet; Take 1 tablet (20 mg total) by mouth 2 (two) times daily.  Dispense: 60 tablet; Refill: 0 - senna-docusate (SENOKOT-S) 8.6-50 MG tablet; Take 1 tablet by mouth daily.  Dispense: 30 tablet; Refill: 0  11. BPH with obstruction/lower urinary tract symptoms Continue on Tamsulosin.  - tamsulosin (FLOMAX) 0.4 MG CAPS capsule; Take 1 capsule (0.4 mg total) by mouth daily after supper.  Dispense: 30 capsule; Refill: 0  Patient is being discharged with the following home health services:   -PT/OT for ROM, exercise, gait stability and muscle strengthening  Patient is being discharged with the following durable medical equipment:   - No new DME required.  Patient has been advised to f/u with their PCP in 1-2 weeks to for a transitions of care visit.Social services at their facility was responsible for arranging this appointment.  Pt was provided with adequate prescriptions of noncontrolled medications to reach the scheduled appointment.For controlled substances, a limited supply was provided as appropriate for the individual patient. If the pt normally receives these medications from a pain clinic or has a contract with another physician, these medications should be received from that clinic or physician only).    Future labs/tests needed:  CBC, BMP in 1-2 weeks PCP

## 2019-12-26 DIAGNOSIS — K5901 Slow transit constipation: Secondary | ICD-10-CM | POA: Insufficient documentation

## 2019-12-26 MED ORDER — LAMOTRIGINE 150 MG PO TABS: 150.0000 mg | ORAL_TABLET | Freq: Two times a day (BID) | ORAL | 0 refills | Status: DC

## 2019-12-26 MED ORDER — MIDODRINE HCL 5 MG PO TABS: 5.0000 mg | ORAL_TABLET | Freq: Three times a day (TID) | ORAL | 0 refills | Status: DC

## 2019-12-26 MED ORDER — AMLODIPINE BESYLATE 10 MG PO TABS: 10.0000 mg | ORAL_TABLET | Freq: Every day | ORAL | 0 refills | Status: DC

## 2019-12-26 MED ORDER — SENNOSIDES-DOCUSATE SODIUM 8.6-50 MG PO TABS: 1.0000 | ORAL_TABLET | Freq: Every day | ORAL | 0 refills | Status: DC

## 2019-12-26 MED ORDER — PANTOPRAZOLE SODIUM 40 MG PO TBEC: 40.0000 mg | DELAYED_RELEASE_TABLET | Freq: Every day | ORAL | 0 refills | Status: DC

## 2019-12-26 MED ORDER — LISINOPRIL 20 MG PO TABS: 20.0000 mg | ORAL_TABLET | Freq: Two times a day (BID) | ORAL | 0 refills | Status: DC

## 2019-12-26 MED ORDER — ATORVASTATIN CALCIUM 40 MG PO TABS: 40.0000 mg | ORAL_TABLET | Freq: Every day | ORAL | 0 refills | Status: DC

## 2019-12-26 MED ORDER — INSULIN DETEMIR 100 UNIT/ML ~~LOC~~ SOLN: 16.0000 [IU] | Freq: Every day | SUBCUTANEOUS | 0 refills | Status: DC

## 2019-12-26 MED ORDER — CARVEDILOL 12.5 MG PO TABS: 12.5000 mg | ORAL_TABLET | Freq: Two times a day (BID) | ORAL | 0 refills | Status: DC

## 2019-12-26 MED ORDER — GLIPIZIDE 10 MG PO TABS: 10.0000 mg | ORAL_TABLET | Freq: Two times a day (BID) | ORAL | 0 refills | Status: DC

## 2019-12-26 MED ORDER — CLOPIDOGREL BISULFATE 75 MG PO TABS: 75.0000 mg | ORAL_TABLET | Freq: Every day | ORAL | 0 refills | Status: DC

## 2019-12-26 MED ORDER — INSULIN ASPART 100 UNIT/ML ~~LOC~~ SOLN: 2.0000 [IU] | Freq: Three times a day (TID) | SUBCUTANEOUS | 0 refills | Status: DC

## 2019-12-26 MED ORDER — INDAPAMIDE 1.25 MG PO TABS: 1.2500 mg | ORAL_TABLET | Freq: Every day | ORAL | 0 refills | Status: DC

## 2019-12-26 MED ORDER — ZINC OXIDE 40 % EX OINT: 1.0000 "application " | TOPICAL_OINTMENT | Freq: Two times a day (BID) | CUTANEOUS | 0 refills | Status: AC | PRN

## 2019-12-26 MED ORDER — TAMSULOSIN HCL 0.4 MG PO CAPS: 0.4000 mg | ORAL_CAPSULE | Freq: Every day | ORAL | 0 refills | Status: DC

## 2019-12-26 MED ORDER — BISACODYL 10 MG RE SUPP: 10.0000 mg | RECTAL | 0 refills | Status: DC | PRN

## 2019-12-27 ENCOUNTER — Telehealth: Payer: Self-pay

## 2019-12-27 DIAGNOSIS — E1159 Type 2 diabetes mellitus with other circulatory complications: Secondary | ICD-10-CM

## 2019-12-27 DIAGNOSIS — I152 Hypertension secondary to endocrine disorders: Secondary | ICD-10-CM

## 2019-12-27 DIAGNOSIS — G40209 Localization-related (focal) (partial) symptomatic epilepsy and epileptic syndromes with complex partial seizures, not intractable, without status epilepticus: Secondary | ICD-10-CM | POA: Diagnosis not present

## 2019-12-27 DIAGNOSIS — N183 Chronic kidney disease, stage 3 unspecified: Secondary | ICD-10-CM

## 2019-12-27 DIAGNOSIS — I69351 Hemiplegia and hemiparesis following cerebral infarction affecting right dominant side: Secondary | ICD-10-CM | POA: Diagnosis not present

## 2019-12-27 DIAGNOSIS — I69320 Aphasia following cerebral infarction: Secondary | ICD-10-CM | POA: Diagnosis not present

## 2019-12-27 DIAGNOSIS — I6990 Unspecified sequelae of unspecified cerebrovascular disease: Secondary | ICD-10-CM

## 2019-12-27 DIAGNOSIS — I129 Hypertensive chronic kidney disease with stage 1 through stage 4 chronic kidney disease, or unspecified chronic kidney disease: Secondary | ICD-10-CM

## 2019-12-27 DIAGNOSIS — E1122 Type 2 diabetes mellitus with diabetic chronic kidney disease: Secondary | ICD-10-CM

## 2019-12-27 DIAGNOSIS — N401 Enlarged prostate with lower urinary tract symptoms: Secondary | ICD-10-CM

## 2019-12-27 DIAGNOSIS — N138 Other obstructive and reflux uropathy: Secondary | ICD-10-CM

## 2019-12-27 DIAGNOSIS — S43001S Unspecified subluxation of right shoulder joint, sequela: Secondary | ICD-10-CM

## 2019-12-27 DIAGNOSIS — I69398 Other sequelae of cerebral infarction: Secondary | ICD-10-CM | POA: Diagnosis not present

## 2019-12-27 MED ORDER — TAMSULOSIN HCL 0.4 MG PO CAPS: 0.4000 mg | ORAL_CAPSULE | Freq: Every day | ORAL | 0 refills | Status: DC

## 2019-12-27 MED ORDER — AMLODIPINE BESYLATE 10 MG PO TABS: 10.0000 mg | ORAL_TABLET | Freq: Every day | ORAL | 0 refills | Status: DC

## 2019-12-27 MED ORDER — INDAPAMIDE 1.25 MG PO TABS: 1.2500 mg | ORAL_TABLET | Freq: Every day | ORAL | 0 refills | Status: DC

## 2019-12-27 MED ORDER — CARVEDILOL 12.5 MG PO TABS: 12.5000 mg | ORAL_TABLET | Freq: Two times a day (BID) | ORAL | 0 refills | Status: DC

## 2019-12-27 NOTE — Telephone Encounter (Signed)
Patient pharmacy faxed over a paper stating that they will not cover 30 day supply and 90 day supply has to be sent in. Per Richarda Blade, NP, 90 day supply sent in and medications pending for provider Ngetich, Dinah, NP.

## 2019-12-27 NOTE — Telephone Encounter (Signed)
Requested medication e-scribed to pharmacy.

## 2019-12-31 ENCOUNTER — Ambulatory Visit: Payer: Medicare Other | Admitting: Family

## 2020-01-17 ENCOUNTER — Ambulatory Visit: Payer: Medicare Other | Admitting: Family

## 2020-01-18 ENCOUNTER — Other Ambulatory Visit: Payer: Self-pay | Admitting: Family

## 2020-01-18 DIAGNOSIS — I951 Orthostatic hypotension: Secondary | ICD-10-CM

## 2020-01-18 DIAGNOSIS — E1142 Type 2 diabetes mellitus with diabetic polyneuropathy: Secondary | ICD-10-CM

## 2020-01-23 ENCOUNTER — Ambulatory Visit (INDEPENDENT_AMBULATORY_CARE_PROVIDER_SITE_OTHER): Payer: Medicare Other | Admitting: Emergency Medicine

## 2020-01-23 DIAGNOSIS — I639 Cerebral infarction, unspecified: Secondary | ICD-10-CM | POA: Diagnosis not present

## 2020-01-27 LAB — CUP PACEART REMOTE DEVICE CHECK
Date Time Interrogation Session: 20210925230449
Implantable Pulse Generator Implant Date: 20201106

## 2020-01-28 NOTE — Progress Notes (Signed)
Carelink Summary Report / Loop Recorder 

## 2020-02-07 ENCOUNTER — Ambulatory Visit: Payer: Medicare Other | Admitting: Nurse Practitioner

## 2020-02-21 ENCOUNTER — Ambulatory Visit: Payer: Medicare Other | Admitting: Family

## 2020-02-23 ENCOUNTER — Emergency Department (HOSPITAL_COMMUNITY): Payer: Medicare Other

## 2020-02-23 ENCOUNTER — Emergency Department (HOSPITAL_COMMUNITY)
Admission: EM | Admit: 2020-02-23 | Discharge: 2020-02-24 | Disposition: A | Discharge status: Home / Self Care | Payer: Medicare Other | Attending: Emergency Medicine | Admitting: Emergency Medicine

## 2020-02-23 DIAGNOSIS — Z794 Long term (current) use of insulin: Secondary | ICD-10-CM | POA: Insufficient documentation

## 2020-02-23 DIAGNOSIS — N179 Acute kidney failure, unspecified: Secondary | ICD-10-CM | POA: Diagnosis not present

## 2020-02-23 DIAGNOSIS — R4701 Aphasia: Secondary | ICD-10-CM | POA: Insufficient documentation

## 2020-02-23 DIAGNOSIS — Z7982 Long term (current) use of aspirin: Secondary | ICD-10-CM | POA: Insufficient documentation

## 2020-02-23 DIAGNOSIS — Z7984 Long term (current) use of oral hypoglycemic drugs: Secondary | ICD-10-CM | POA: Insufficient documentation

## 2020-02-23 DIAGNOSIS — Z79899 Other long term (current) drug therapy: Secondary | ICD-10-CM | POA: Diagnosis not present

## 2020-02-23 DIAGNOSIS — I10 Essential (primary) hypertension: Secondary | ICD-10-CM | POA: Insufficient documentation

## 2020-02-23 DIAGNOSIS — R4182 Altered mental status, unspecified: Secondary | ICD-10-CM | POA: Diagnosis present

## 2020-02-23 DIAGNOSIS — E1140 Type 2 diabetes mellitus with diabetic neuropathy, unspecified: Secondary | ICD-10-CM | POA: Insufficient documentation

## 2020-02-23 LAB — COMPREHENSIVE METABOLIC PANEL
ALT: 21 U/L (ref 0–44)
AST: 15 U/L (ref 15–41)
Albumin: 3.7 g/dL (ref 3.5–5.0)
Alkaline Phosphatase: 70 U/L (ref 38–126)
Anion gap: 9 (ref 5–15)
BUN: 31 mg/dL — ABNORMAL HIGH (ref 8–23)
CO2: 26 mmol/L (ref 22–32)
Calcium: 9.5 mg/dL (ref 8.9–10.3)
Chloride: 102 mmol/L (ref 98–111)
Creatinine, Ser: 1.81 mg/dL — ABNORMAL HIGH (ref 0.61–1.24)
GFR, Estimated: 40 mL/min — ABNORMAL LOW (ref >60)
Glucose, Bld: 112 mg/dL — ABNORMAL HIGH (ref 70–99)
Potassium: 3.6 mmol/L (ref 3.5–5.1)
Sodium: 137 mmol/L (ref 135–145)
Total Bilirubin: 1 mg/dL (ref 0.3–1.2)
Total Protein: 6.5 g/dL (ref 6.5–8.1)

## 2020-02-23 LAB — CBC WITH DIFFERENTIAL/PLATELET
Abs Immature Granulocytes: 0.01 10*3/uL (ref 0.00–0.07)
Basophils Absolute: 0 10*3/uL (ref 0.0–0.1)
Basophils Relative: 1 %
Eosinophils Absolute: 0.1 10*3/uL (ref 0.0–0.5)
Eosinophils Relative: 1 %
HCT: 36.9 % — ABNORMAL LOW (ref 39.0–52.0)
Hemoglobin: 12 g/dL — ABNORMAL LOW (ref 13.0–17.0)
Immature Granulocytes: 0 %
Lymphocytes Relative: 29 %
Lymphs Abs: 1.6 10*3/uL (ref 0.7–4.0)
MCH: 29.7 pg (ref 26.0–34.0)
MCHC: 32.5 g/dL (ref 30.0–36.0)
MCV: 91.3 fL (ref 80.0–100.0)
Monocytes Absolute: 0.4 10*3/uL (ref 0.1–1.0)
Monocytes Relative: 8 %
Neutro Abs: 3.5 10*3/uL (ref 1.7–7.7)
Neutrophils Relative %: 61 %
Platelets: 228 10*3/uL (ref 150–400)
RBC: 4.04 MIL/uL — ABNORMAL LOW (ref 4.22–5.81)
RDW: 13.1 % (ref 11.5–15.5)
WBC: 5.7 10*3/uL (ref 4.0–10.5)
nRBC: 0 % (ref 0.0–0.2)

## 2020-02-23 LAB — URINALYSIS, ROUTINE W REFLEX MICROSCOPIC
Bacteria, UA: NONE SEEN
Bilirubin Urine: NEGATIVE
Glucose, UA: NEGATIVE mg/dL
Hgb urine dipstick: NEGATIVE
Ketones, ur: NEGATIVE mg/dL
Nitrite: NEGATIVE
Protein, ur: NEGATIVE mg/dL
Specific Gravity, Urine: 1.019 (ref 1.005–1.030)
pH: 5 (ref 5.0–8.0)

## 2020-02-23 LAB — RAPID URINE DRUG SCREEN, HOSP PERFORMED
Amphetamines: NOT DETECTED
Barbiturates: NOT DETECTED
Benzodiazepines: NOT DETECTED
Cocaine: NOT DETECTED
Opiates: NOT DETECTED
Tetrahydrocannabinol: NOT DETECTED

## 2020-02-23 LAB — ETHANOL: Alcohol, Ethyl (B): <10 mg/dL (ref <10)

## 2020-02-23 LAB — LACTIC ACID, PLASMA: Lactic Acid, Venous: 1 mmol/L (ref 0.5–1.9)

## 2020-02-23 LAB — PROTIME-INR
INR: 1.1 (ref 0.8–1.2)
Prothrombin Time: 13.3 seconds (ref 11.4–15.2)

## 2020-02-23 LAB — AMMONIA: Ammonia: 26 umol/L (ref 9–35)

## 2020-02-23 LAB — CBG MONITORING, ED: Glucose-Capillary: 119 mg/dL — ABNORMAL HIGH (ref 70–99)

## 2020-02-23 MED ORDER — SODIUM CHLORIDE 0.9 % IV BOLUS
500.0000 mL | Freq: Once | INTRAVENOUS | Status: AC
  Administered 2020-02-23: 500 mL via INTRAVENOUS

## 2020-02-23 NOTE — Discharge Instructions (Signed)
His work-up today was reassuring.  I did not see any signs of infection on the leg and his scans did not show any evidence of broken bone in the leg.  His kidney function was slightly up.  He was given some fluids here in the ED.  He needs to have his kidney function rechecked by his primary care doctor in a week.  Return to the emergency department for any fevers, vomiting, difficulty breathing or any other worsening concerning symptoms.

## 2020-02-23 NOTE — ED Notes (Signed)
Pt to CT

## 2020-02-23 NOTE — ED Notes (Signed)
Pt resting in bed. NADN. Family at bedside 

## 2020-02-23 NOTE — ED Notes (Signed)
Pt resting in bed. NADN 

## 2020-02-23 NOTE — ED Triage Notes (Signed)
Pt presents via EMS from private residence. EMS states pt family who is primary caregiver called d/t pt "Suddenly Not acting like himself" @ 1230 today. Upon arrival pt with incomprehensible speech and significant right sided weakness that EMS states is baseline sequela to previous CVAs.

## 2020-02-23 NOTE — ED Provider Notes (Signed)
Care assumed from [facility] [place], [place] at shift change with labs and imaging pending.   In brief, this patient is a 69 y.o. 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  Daughter states that about 1230, patient started acting different from baseline.  She describes it as being more irritable and pointing to his right leg and seeming upset.  He has a history of aphasia and right sided deficits which is at his baseline. Please see note from previous provider for full history/lphysical exam.     Physical Exam  BP (!) 147/77 (BP Location: Left Arm)   Pulse 75   Temp 98.2 F (36.8 C) (Oral)   Resp 16   SpO2 100%   Physical Exam   I am able to passively range of motion the right lower extremity without any difficulty.  Bilateral lower extremities are symmetric in appearance without any overlying warmth, erythema, edema.  No deformity noted to right hip, right tib-fib, right femur, right knee, right ankle, right foot.  Flexion/tension of right knee intact.  No open wounds.  2+ DP pulses bilaterally. Good distal cap refill.  RLE is not dusky in appearance or cool to touch.  ED Course/Procedures     Procedures  Results for orders placed or performed during the hospital encounter of 02/23/20 (from the past 24 hour(s))  CBG monitoring, ED     Status: Abnormal   Collection Time: 02/23/20  1:53 PM  Result Value Ref Range   Glucose-Capillary 119 (H) 70 - 99 mg/dL  Urinalysis, Routine w reflex microscopic Urine, Clean Catch     Status: Abnormal   Collection Time: 02/23/20  3:43 PM  Result Value Ref Range   Color, Urine YELLOW YELLOW   APPearance HAZY (A) CLEAR   Specific Gravity, Urine 1.019 1.005 - 1.030   pH 5.0 5.0 - 8.0   Glucose, UA NEGATIVE NEGATIVE mg/dL   Hgb urine dipstick NEGATIVE NEGATIVE   Bilirubin Urine NEGATIVE NEGATIVE   Ketones, ur NEGATIVE NEGATIVE mg/dL   Protein, ur NEGATIVE NEGATIVE  mg/dL   Nitrite NEGATIVE NEGATIVE   Leukocytes,Ua TRACE (A) NEGATIVE   RBC / HPF 0-5 0 - 5 RBC/hpf   WBC, UA 0-5 0 - 5 WBC/hpf   Bacteria, UA NONE SEEN NONE SEEN   Squamous Epithelial / LPF 0-5 0 - 5   Mucus PRESENT   Rapid urine drug screen (hospital performed)     Status: None   Collection Time: 02/23/20  3:43 PM  Result Value Ref Range   Opiates NONE DETECTED NONE DETECTED   Cocaine NONE DETECTED NONE DETECTED   Benzodiazepines NONE DETECTED NONE DETECTED   Amphetamines NONE DETECTED NONE DETECTED   Tetrahydrocannabinol NONE DETECTED NONE DETECTED   Barbiturates NONE DETECTED NONE DETECTED  Lactic acid, plasma     Status: None   Collection Time: 02/23/20  4:07 PM  Result Value Ref Range   Lactic Acid, Venous 1.0 0.5 - 1.9 mmol/L  Ammonia     Status: None   Collection Time: 02/23/20  4:07 PM  Result Value Ref Range   Ammonia 26 9 - 35 umol/L  Ethanol     Status: None   Collection Time: 02/23/20  4:07 PM  Result Value Ref Range   Alcohol, Ethyl (B) <10 <10 mg/dL  Comprehensive metabolic panel     Status: Abnormal   Collection Time: 02/23/20  4:07 PM  Result Value Ref Range  Sodium 137 135 - 145 mmol/L   Potassium 3.6 3.5 - 5.1 mmol/L   Chloride 102 98 - 111 mmol/L   CO2 26 22 - 32 mmol/L   Glucose, Bld 112 (H) 70 - 99 mg/dL   BUN 31 (H) 8 - 23 mg/dL   Creatinine, Ser 4.13 (H) 0.61 - 1.24 mg/dL   Calcium 9.5 8.9 - 24.4 mg/dL   Total Protein 6.5 6.5 - 8.1 g/dL   Albumin 3.7 3.5 - 5.0 g/dL   AST 15 15 - 41 U/L   ALT 21 0 - 44 U/L   Alkaline Phosphatase 70 38 - 126 U/L   Total Bilirubin 1.0 0.3 - 1.2 mg/dL   GFR, Estimated 40 (L) >60 mL/min   Anion gap 9 5 - 15  Protime-INR     Status: None   Collection Time: 02/23/20  4:07 PM  Result Value Ref Range   Prothrombin Time 13.3 11.4 - 15.2 seconds   INR 1.1 0.8 - 1.2  CBC with Differential     Status: Abnormal   Collection Time: 02/23/20  6:44 PM  Result Value Ref Range   WBC 5.7 4.0 - 10.5 K/uL   RBC 4.04 (L) 4.22  - 5.81 MIL/uL   Hemoglobin 12.0 (L) 13.0 - 17.0 g/dL   HCT 01.0 (L) 39 - 52 %   MCV 91.3 80.0 - 100.0 fL   MCH 29.7 26.0 - 34.0 pg   MCHC 32.5 30.0 - 36.0 g/dL   RDW 27.2 53.6 - 64.4 %   Platelets 228 150 - 400 K/uL   nRBC 0.0 0.0 - 0.2 %   Neutrophils Relative % 61 %   Neutro Abs 3.5 1.7 - 7.7 K/uL   Lymphocytes Relative 29 %   Lymphs Abs 1.6 0.7 - 4.0 K/uL   Monocytes Relative 8 %   Monocytes Absolute 0.4 0.1 - 1.0 K/uL   Eosinophils Relative 1 %   Eosinophils Absolute 0.1 0.0 - 0.5 K/uL   Basophils Relative 1 %   Basophils Absolute 0.0 0.0 - 0.1 K/uL   Immature Granulocytes 0 %   Abs Immature Granulocytes 0.01 0.00 - 0.07 K/uL    MDM   PLAN: Patient getting x-rays of right lower extremity.  Patient obtaining CT head.  Previous provider and ED attending are not concerned about CVA.  No indication for MRI.  Additionally, he has pending labs.  Dispo decision based on work-up.  MDM: Ethanol, ammonia, lactic are all within normal limits.  CMP shows BUN of 31, creatinine of 1.81.  This is a slight bump from his baseline creatinine.  He usually runs between 0.9-1.1.  UDS is negative.  UA shows trace leukocytes.  No pyuria, bacteria.  CBG is 118. CBC shows no leukocytosis. Hgb stable at 12.0.   CT head shows no evidence of acute intracranial abnormality.  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  X-ray of foot, ankle, tib-fib negative.  X-ray of right knee shows a nonspecific area of sclerosis involving the medial tibial plateau.  This may represent a nondisplaced fracture in the appropriate clinical setting.  He has tricompartmental degenerative changes are noted.  Small joint effusion. Will obtain CT of right knee.   Daughter is at bedside.  I discussed with her the results.  I discussed with her that given the findings on x-ray, will plan for CT of the knee to evaluate for any possible small broken bone.  Patient  has history of right-sided deficits from previous CVA as well as aphasia.  She states that she does not know of any trauma, fall.  CT of right knee shows tricompartmental osteoarthritis with peripheral spurring.  He has a corticated density that could represent a fragmented osteophyte or intra-articular body.  No acute bony abnormality.  They suspect that the previous sclerosis on x-ray was likely related to degenerative change.  No evidence of inflammatory change.  Small knee joint effusion.  Patient given a small bolus of fluid here in the ED.  He has been cooperative and well-appearing on exam.  Patient with good distal pulses, cap refill.  History/physical exam not concerning for ischemic limb, DVT, septic arthritis.  At this time, his work-up is reassuring.  We will plan to discharge home.  I called daughter and updated her on patient's work-up.  She does state that patient is at his baseline in regards to his motor function of his right lower extremity as well as his aphasia.  She states that her concern was that he was getting more irritable and mad throughout the night.  She does endorse that this has been happening for a while and states that he will wake up in the night and become angry.  I discussed with her regarding following up with primary care doctor to see if he would potentially need any sleep medication.  I also discussed with daughter regarding his creatinine function here in the emergency department today.  He was given a small bolus of fluid.  I discussed with daughter that he should have his blood rechecked in about a week. At this time, patient exhibits no emergent life-threatening condition that require further evaluation in ED. Discussed patient with Dr. Rush Landmark who is agreeable to plan.   1. AKI (acute kidney injury) (HCC)      Portions of this note were generated with Scientist, clinical (histocompatibility and immunogenetics). Dictation errors may occur despite best attempts at proofreading.     Maxwell Caul, PA-C 02/23/20 2319    Tegeler, Canary Brim, MD 02/24/20 [phone]

## 2020-02-23 NOTE — ED Notes (Signed)
Performed sterile inandout catheterization of pt bladder per md direction. Pt tolerated procedure well. out.

## 2020-02-23 NOTE — ED Notes (Signed)
Called [facility] for transport home--[name] 

## 2020-02-23 NOTE — ED Notes (Signed)
Phlebotomy at bedside to draw blood.  

## 2020-02-23 NOTE — ED Provider Notes (Signed)
[name]MENT Provider Note   CSN: [id number] Arrival date & time: 02/23/20  1351     History Chief Complaint  Patient presents with  . Altered Mental Status    [name]. is a 69 y.o. male history of CVA with residual aphasia and right-sided paralysis, diabetes, hypertension.  History obtained from triage note, patient from home primary caregiver was concerned of patient not acting like himself around 1230 today, baseline mental status per EMS. - On initial evaluation patient is aphasic which appears to be his baseline, he is pointing at his right foot, says the word yes when his right foot is palpated, does not appear to be tender and no deformities.  Level 5 caveat nonverbal. - 2:21 PM: Spoke with patient's daughter and caregiver [name] over the phone.  She reports that patient was in normal state of health this morning at 1230 he seemed more irritable than normal, he kept on pointing at his right leg and seemed upset gasping at it.  She is also concerned that his blood pressure seemed lower than normal.  HPI     Past Medical History:  Diagnosis Date  . Acute blood loss anemia   . Acute embolic stroke (HCC)   . Acute lower UTI   . AMS (altered mental status) 06/05/2019  . Cerebral edema (HCC) 04/04/2018  . Diabetes 1.5, managed as type 2 (HCC)   . Diabetes mellitus without complication (HCC)   . History of CVA with residual deficit   . History of Dysphagia, post-stroke   . Hypertension   . Ischemic stroke (HCC) 03/05/2019  . Labile blood glucose   . 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 Renal insufficiency 04/04/2018  . Stroke (HCC)   . UTI (urinary tract infection) 03/05/2019    Patient Active Problem List   Diagnosis Date Noted  . Slow transit constipation 12/26/2019  . Seizure (HCC) 11/20/2019  . Seizures (HCC) 11/20/2019  . Encephalopathy 11/19/2019  . Delayed orthostatic hypotension 08/04/2019  .  Seizure disorder (HCC) 07/21/2019  . 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 Shoulder subluxation, right, sequela 07/16/2019  . Altered mental status 07/15/2019  . 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 Late effects of CVA (cerebrovascular accident)   . Pressure injury of skin 06/08/2019  . History of loop recorder 03/08/2019  . UTI (urinary tract infection) 03/05/2019  . 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 Global aphasia   . Type 2 diabetes mellitus with peripheral neuropathy (HCC)   . Hypertension associated with diabetes (HCC)   . Hyperlipidemia associated with type 2 diabetes mellitus (HCC)   . History of Dysphagia, post-stroke     Past Surgical History:  Procedure Laterality Date  . IR PATIENT EVAL TECH 0-60 MINS  03/30/2018  . LOOP RECORDER INSERTION N/A 03/08/2019   Procedure: LOOP RECORDER INSERTION;  Surgeon: Regan Lemming, MD;  Location: [facility] INVASIVE CV LAB;  Service: Cardiovascular;  Laterality: N/A;  . TEE WITHOUT CARDIOVERSION N/A 04/02/2018   Procedure: TRANSESOPHAGEAL ECHOCARDIOGRAM (TEE);  Surgeon: [name], MD;  Location: [facility] ENDOSCOPY;  Service: Cardiovascular;  Laterality: N/A;       Family History  Problem Relation Age of Onset  . Hypertension Mother   . Hypertension Father     Social History   Tobacco Use  . Smoking status: Never Smoker  . Smokeless tobacco: Never Used  Vaping  Use  . Vaping Use: Never used  Substance Use Topics  . Alcohol use: Not Currently  . Drug use: Never    Home Medications Prior to Admission medications   Medication Sig Start Date End Date Taking? Authorizing Provider  amLODipine (NORVASC) 10 MG tablet Take 1 tablet (10 mg total) by mouth daily. 12/27/19   Ngetich, Dinah C, NP  ascorbic acid (VITAMIN C) 500 MG tablet Take 1 tablet (500 mg total) by mouth daily as needed (if cold-like symptoms are present). 08/30/19    Margit Hanks, MD  aspirin EC 325 MG tablet Take 325 mg by mouth every morning.    [provider]  atorvastatin (LIPITOR) 40 MG tablet Take 1 tablet (40 mg total) by mouth daily at 6 (six) AM. 12/26/19   Ngetich, Dinah C, NP  bisacodyl (DULCOLAX) 10 MG suppository Place 1 suppository (10 mg total) rectally as needed for moderate constipation. 12/26/19   Ngetich, Dinah C, NP  carvedilol (COREG) 12.5 MG tablet Take 1 tablet (12.5 mg total) by mouth 2 (two) times daily with a meal. 12/27/19   Ngetich, Dinah C, NP  clopidogrel (PLAVIX) 75 MG tablet Take 1 tablet (75 mg total) by mouth daily. 12/26/19   Ngetich, Dinah C, NP  glipiZIDE (GLUCOTROL) 10 MG tablet Take 1 tablet (10 mg total) by mouth 2 (two) times daily with a meal. 12/26/19   Ngetich, Dinah C, NP  indapamide (LOZOL) 1.25 MG tablet Take 1 tablet (1.25 mg total) by mouth daily. 12/27/19   Ngetich, Dinah C, NP  insulin aspart (NOVOLOG) 100 UNIT/ML injection Inject 2-15 Units into the skin 4 (four) times daily -  before meals and at bedtime. SSI:  121-150 = 2 units; 151-200 = 3 units, 201-250 = 5 units, 251-300 = 8 units, 301-350 = 11 units, 351-300 = 15 units, greater than 400 = 15 units and call MD 12/26/19   Ngetich, Dinah C, NP  insulin detemir (LEVEMIR) 100 UNIT/ML injection Inject 0.16 mLs (16 Units total) into the skin daily. If CBG >125 (per daughter) 12/26/19   Ngetich, Donalee Citrin, NP  lamoTRIgine (LAMICTAL) 150 MG tablet Take 1 tablet (150 mg total) by mouth 2 (two) times daily. 12/26/19   Ngetich, Dinah C, NP  lisinopril (ZESTRIL) 20 MG tablet Take 1 tablet (20 mg total) by mouth 2 (two) times daily. 12/26/19   Ngetich, Dinah C, NP  liver oil-zinc oxide (DESITIN) 40 % ointment Apply 1 application topically 2 (two) times daily as needed for irritation. 12/26/19   Ngetich, Dinah C, NP  midodrine (PROAMATINE) 5 MG tablet Take 1 tablet (5 mg total) by mouth in the morning, at noon, and at bedtime. FOR ORTHOSTATIC HYPOTENSION 12/26/19    Ngetich, Dinah C, NP  pantoprazole (PROTONIX) 40 MG tablet Take 1 tablet (40 mg total) by mouth daily. 12/26/19   Ngetich, Dinah C, NP  senna-docusate (SENOKOT-S) 8.6-50 MG tablet Take 1 tablet by mouth daily. 12/26/19   Ngetich, Dinah C, NP  tamsulosin (FLOMAX) 0.4 MG CAPS capsule Take 1 capsule (0.4 mg total) by mouth daily after supper. 12/27/19   Ngetich, Dinah C, NP  Vitamin D3 (VITAMIN D) 25 MCG tablet Take 0.5 tablets (500 Units total) by mouth daily. 08/30/19   Margit Hanks, MD    Allergies    Ciprofloxacin, Keppra [levetiracetam], Avocado, and Metformin and related  Review of Systems   Review of Systems  Unable to perform ROS: Patient nonverbal    Physical Exam Updated Vital Signs  BP 117/85 (BP Location: Right Arm)   Pulse 75   Temp (!) 97.5 F (36.4 C) (Oral)   Resp 16   SpO2 100%   Physical Exam Constitutional:      General: He is not in acute distress.    Appearance: Normal appearance. He is well-developed. He is not ill-appearing or diaphoretic.  HENT:     Head: Normocephalic and atraumatic.  Eyes:     General: Vision grossly intact. Gaze aligned appropriately.     Pupils: Pupils are equal, round, and reactive to light.  Neck:     Trachea: Trachea and phonation normal.  Pulmonary:     Effort: Pulmonary effort is normal. No respiratory distress.  Abdominal:     General: There is no distension.     Palpations: Abdomen is soft.     Tenderness: There is no abdominal tenderness. There is no guarding or rebound.  Musculoskeletal:        General: Normal range of motion.     Cervical back: Normal range of motion.  Skin:    General: Skin is warm and dry.  Neurological:     Mental Status: He is alert.     GCS: GCS eye subscore is 4. GCS verbal subscore is 5. GCS motor subscore is 6.     Comments: Aphasia, will respond with the word yes, follows basic commands Right-sided paralysis, moves the left arm with good strength.  Psychiatric:        Behavior: Behavior  normal.     ED Results / Procedures / Treatments   Labs (all labs ordered are listed, but only abnormal results are displayed) Labs Reviewed  CBG MONITORING, ED - Abnormal; Notable for the following components:      Result Value   Glucose-Capillary 119 (*)    All other components within normal limits    EKG None  Radiology No results found.  Procedures Procedures (including critical care time)  Medications Ordered in ED Medications - No data to display  ED Course  I have reviewed the triage vital signs and the nursing notes.  Pertinent labs & imaging results that were available during my care of the patient were reviewed by me and considered in my medical decision making (see chart for details).    MDM Rules/Calculators/A&P                         Additional history obtained from: 1. Nursing notes from this visit. 2. Review of electronic medical records. 3. Family. ------------------- This 69 year old male presented for abnormal behavior at home, this seems to be that he is concerned with his right leg, there is no injury there.  No evidence of cellulitis, DVT, compartment syndrome or other emergent pathologies.  Patient seen and evaluated with Dr. Stevie Kern as well.  Will obtain screening labs, CT head and imaging of the right lower leg.  Patient's mental status appears to be at his baseline per daughter, low suspicion for CVA at this time, additionally vital signs are within normal limits doubt infection at this time.  CT head:    IMPRESSION:  1. No evidence of acute intracranial abnormality.  2. Similar extensive encephalomalacia associated with a chronic  large left MCA and PCA territory infarct.  3. Similar background of chronic microvascular ischemic disease and  generalized atrophy.   CBG 119 - Care handoff given to Graciella Freer, PA-C at shift change plan of care is to follow-up on pending studies, pending  no acute findings anticipate discharge.  Final  disposition per oncoming team.  Note: Portions of this report may have been transcribed using voice recognition software. Every effort was made to ensure accuracy; however, inadvertent computerized transcription errors may still be present. Final Clinical Impression(s) / ED Diagnoses Final diagnoses:  None    Rx / DC Orders ED Discharge Orders    None       Elizabeth PalauMorelli, Filippo Puls A, PA-C 02/23/20 1533    Milagros Lollykstra, Richard S, MD 02/24/20 [phone]

## 2020-02-23 NOTE — ED Notes (Addendum)
Right lower extremity is flaccid.

## 2020-02-24 ENCOUNTER — Ambulatory Visit (INDEPENDENT_AMBULATORY_CARE_PROVIDER_SITE_OTHER): Payer: Medicare Other

## 2020-02-24 DIAGNOSIS — I639 Cerebral infarction, unspecified: Secondary | ICD-10-CM

## 2020-02-24 NOTE — ED Notes (Signed)
S/W [name] and advised that [facility] is at facility to transport pt home. She is home waiting for pt.

## 2020-02-28 LAB — CUP PACEART REMOTE DEVICE CHECK
Date Time Interrogation Session: 20211028230351
Implantable Pulse Generator Implant Date: 20201106

## 2020-03-02 NOTE — Progress Notes (Signed)
Carelink Summary Report / Loop Recorder 

## 2020-03-06 ENCOUNTER — Encounter: Payer: Self-pay | Admitting: Family

## 2020-03-06 ENCOUNTER — Other Ambulatory Visit: Payer: Self-pay

## 2020-03-06 ENCOUNTER — Ambulatory Visit (INDEPENDENT_AMBULATORY_CARE_PROVIDER_SITE_OTHER): Payer: Medicare Other | Admitting: Family

## 2020-03-06 VITALS — BP 122/82 | HR 67 | Temp 97.5°F | Ht 74.0 in | Wt 171.0 lb

## 2020-03-06 DIAGNOSIS — E1159 Type 2 diabetes mellitus with other circulatory complications: Secondary | ICD-10-CM | POA: Diagnosis not present

## 2020-03-06 DIAGNOSIS — Z789 Other specified health status: Secondary | ICD-10-CM

## 2020-03-06 DIAGNOSIS — G4701 Insomnia due to medical condition: Secondary | ICD-10-CM

## 2020-03-06 DIAGNOSIS — I951 Orthostatic hypotension: Secondary | ICD-10-CM

## 2020-03-06 DIAGNOSIS — E1142 Type 2 diabetes mellitus with diabetic polyneuropathy: Secondary | ICD-10-CM

## 2020-03-06 DIAGNOSIS — I6990 Unspecified sequelae of unspecified cerebrovascular disease: Secondary | ICD-10-CM

## 2020-03-06 DIAGNOSIS — I152 Hypertension secondary to endocrine disorders: Secondary | ICD-10-CM

## 2020-03-06 DIAGNOSIS — G40209 Localization-related (focal) (partial) symptomatic epilepsy and epileptic syndromes with complex partial seizures, not intractable, without status epilepticus: Secondary | ICD-10-CM

## 2020-03-06 DIAGNOSIS — E1169 Type 2 diabetes mellitus with other specified complication: Secondary | ICD-10-CM

## 2020-03-06 DIAGNOSIS — E785 Hyperlipidemia, unspecified: Secondary | ICD-10-CM

## 2020-03-06 DIAGNOSIS — I69391 Dysphagia following cerebral infarction: Secondary | ICD-10-CM

## 2020-03-06 DIAGNOSIS — I639 Cerebral infarction, unspecified: Secondary | ICD-10-CM

## 2020-03-06 DIAGNOSIS — L602 Onychogryphosis: Secondary | ICD-10-CM

## 2020-03-06 MED ORDER — AMLODIPINE BESYLATE 10 MG PO TABS: 10.0000 mg | ORAL_TABLET | Freq: Every day | ORAL | 1 refills | Status: DC

## 2020-03-06 MED ORDER — MIDODRINE HCL 5 MG PO TABS: 5.0000 mg | ORAL_TABLET | Freq: Three times a day (TID) | ORAL | 1 refills | Status: DC

## 2020-03-06 MED ORDER — BACLOFEN 20 MG PO TABS: 20.0000 mg | ORAL_TABLET | Freq: Two times a day (BID) | ORAL | 1 refills | Status: DC

## 2020-03-06 MED ORDER — LAMOTRIGINE 150 MG PO TABS: 150.0000 mg | ORAL_TABLET | Freq: Two times a day (BID) | ORAL | 1 refills | Status: DC

## 2020-03-06 MED ORDER — TRAZODONE HCL 50 MG PO TABS: 25.0000 mg | ORAL_TABLET | Freq: Every evening | ORAL | 3 refills | Status: DC | PRN

## 2020-03-06 NOTE — Progress Notes (Signed)
Provider: Marlowe Sax FNP-C   Onita Pfluger, Nelda Bucks, NP  Patient Care Team: Addylynn Balin, Nelda Bucks, NP as PCP - General (Family Medicine)  Extended Emergency Contact Information Primary Emergency Contact: [facility] Phone: [phone] Mobile Phone: [phone] Relation: Daughter Secondary Emergency Contact: Orlov,Joyce Mobile Phone: [phone] Relation: None Preferred language: English Interpreter needed? No  Code Status: Full Code  Goals of care: Advanced Directive information Advanced Directives 03/06/2020  Does Patient Have a Medical Advance Directive? Yes  Type of Advance Directive [place]  Does patient want to make changes to medical advance directive? No - Patient declined  Copy of Gays in Chart? -  Would patient like information on creating a medical advance directive? -     Chief Complaint  Patient presents with   Establish Care    Patient to establish with [facility]    HPI:  Pt is a 69 y.o. male seen today to establish care here at the [facility] office for medical management of chronic diseases.He is not new to the Provider.I previous saw him while he was admitted at [facility].He is here with daughter Kentrail Shew who is the primary care giver.  He was discharged from [facility] on 12/25/2019>He was there for rehabilitation post hospital admission from 11/20/19 - 11/27/2019 admitted for seizure.CT scan showed acute intracranial hemorrhage or infarct large left MCA and PCA territory infarct.Repeat Ct scan was unremarkable>neurology ordered lamictal. He has a medical history of Hypertension,Type 2 DM,Hyperlipidemia,Complex partial seizure disorder,right shoulder subluxation,Orthostatic hypotension,BPH with lower urinary tract symptoms,Hx of CVA with residual deficit ,Dysphagia post stroke,CKD stage 3 among others. Daughter states he requires assistance with transfer and  ADL's.He feeds himself.He is non-verbal but follows direction.Also states patient sometimes does not sleep at night just makes noise and points to the wall or ceiling.He does not attempt to get out of bed without assistance. He completed speech therapy after discharge from SNF.Daughter thinks needs additional ST due to coughing during visit or when he drinks fluid.  Has had no fall episode. Has areas on his sacral areas from previous pressure ulcer which are not open.Daughter has been applying butt paste. His CBG at home ranging in the 100's - 110's. Blood pressure readings are in the 110's/70's.  Also needs medication refilled Baclofen,midodrine ,Glipizide,Flomax  And amlodipine.           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   Renal insufficiency 04/04/2018   Seizures ([place])    Stroke ([place])    UTI (urinary tract infection) 03/05/2019   Past Surgical History:  Procedure Laterality Date   IR PATIENT EVAL TECH 0-60 MINS  03/30/2018   LOOP RECORDER INSERTION N/A 03/08/2019   Procedure: LOOP RECORDER INSERTION;  Surgeon: Constance Haw, MD;  Location: [facility] CV LAB;  Service: Cardiovascular;  Laterality: N/A;   TEE  WITHOUT CARDIOVERSION N/A 04/02/2018   Procedure: TRANSESOPHAGEAL ECHOCARDIOGRAM (TEE);  Surgeon: Skeet Latch, MD;  Location: [facility] ENDOSCOPY;  Service: Cardiovascular;  Laterality: N/A;    Allergies  Allergen Reactions   Ciprofloxacin Anaphylaxis    Keppra [Levetiracetam] Other (See Comments)    Makes the patient very agitated and "not like himself"   Avocado Nausea And Vomiting   Metformin And Related Other (See Comments)    "Renal issue"    Allergies as of 03/06/2020      Reactions   Ciprofloxacin Anaphylaxis   Keppra [levetiracetam] Other (See Comments)   Makes the patient very agitated and "not like himself"   Avocado Nausea And Vomiting   Metformin And Related Other (See Comments)   "Renal issue"      Medication List       Accurate as of March 06, 2020 11:37 AM. If you have any questions, ask your nurse or doctor.        amLODipine 10 MG tablet Commonly known as: NORVASC Take 1 tablet (10 mg total) by mouth daily.   ascorbic acid 500 MG tablet Commonly known as: VITAMIN C Take 1 tablet (500 mg total) by mouth daily as needed (if cold-like symptoms are present).   aspirin EC 325 MG tablet Take 325 mg by mouth every morning.   atorvastatin 40 MG tablet Commonly known as: LIPITOR Take 1 tablet (40 mg total) by mouth daily at 6 (six) AM.   baclofen 20 MG tablet Commonly known as: LIORESAL Take 20 mg by mouth 2 (two) times daily.   bisacodyl 10 MG suppository Commonly known as: DULCOLAX Place 1 suppository (10 mg total) rectally as needed for moderate constipation.   carvedilol 12.5 MG tablet Commonly known as: COREG Take 1 tablet (12.5 mg total) by mouth 2 (two) times daily with a meal.   clopidogrel 75 MG tablet Commonly known as: PLAVIX Take 1 tablet (75 mg total) by mouth daily.   glipiZIDE 10 MG tablet Commonly known as: GLUCOTROL Take 1 tablet (10 mg total) by mouth 2 (two) times daily with a meal.   indapamide 1.25 MG tablet Commonly known as: LOZOL Take 1 tablet (1.25 mg total) by mouth daily.   insulin aspart 100 UNIT/ML injection Commonly known as: NovoLOG Inject 2-15 Units into the skin 4 (four) times daily -  before meals and at bedtime. SSI:  121-150 = 2 units; 151-200 = 3 units,  201-250 = 5 units, 251-300 = 8 units, 301-350 = 11 units, 351-300 = 15 units, greater than 400 = 15 units and call MD   insulin detemir 100 UNIT/ML injection Commonly known as: LEVEMIR Inject 0.16 mLs (16 Units total) into the skin daily. If CBG >125 (per daughter)   lamoTRIgine 150 MG tablet Commonly known as: LAMICTAL Take 1 tablet (150 mg total) by mouth 2 (two) times daily.   lisinopril 20 MG tablet Commonly known as: ZESTRIL Take 1 tablet (20 mg total) by mouth 2 (two) times daily.   liver oil-zinc oxide 40 % ointment Commonly known as: DESITIN Apply 1 application topically 2 (two) times daily as needed for irritation.   midodrine 5 MG tablet Commonly known as: PROAMATINE Take 1 tablet (5 mg total) by mouth in the morning, at noon, and at bedtime. FOR ORTHOSTATIC HYPOTENSION   OMEGA-3 FISH OIL PO Take by mouth.   pantoprazole 40 MG tablet Commonly known as: PROTONIX Take 1 tablet (40 mg total) by mouth daily.   senna-docusate 8.6-50 MG tablet Commonly known  as: Senokot-S Take 1 tablet by mouth daily.   tamsulosin 0.4 MG Caps capsule Commonly known as: FLOMAX Take 1 capsule (0.4 mg total) by mouth daily after supper.   Vitamin D3 25 MCG tablet Commonly known as: Vitamin D Take 0.5 tablets (500 Units total) by mouth daily.       Review of Systems  Constitutional: Negative for appetite change, chills, fatigue and fever.  HENT: Negative for congestion, rhinorrhea, sinus pressure, sinus pain, sneezing and sore throat.        Coughing during meals   Eyes: Negative for discharge and itching.  Respiratory: Negative for cough, chest tightness, shortness of breath and wheezing.   Cardiovascular: Negative for chest pain, palpitations and leg swelling.  Gastrointestinal: Negative for abdominal distention, abdominal pain, constipation, diarrhea, nausea and vomiting.  Endocrine: Negative for cold intolerance, heat intolerance, polydipsia, polyphagia and polyuria.    Genitourinary: Negative for difficulty urinating, dysuria, flank pain and urgency.  Musculoskeletal: Positive for gait problem. Negative for joint swelling and myalgias.  Skin: Negative for color change, pallor and rash.  Neurological: Positive for weakness. Negative for dizziness, speech difficulty, light-headedness and headaches.  Hematological: Does not bruise/bleed easily.  Psychiatric/Behavioral: Negative for agitation and sleep disturbance. The patient is not nervous/anxious.     Immunization History  Administered Date(s) Administered   Influenza-Unspecified 01/01/2019   Pneumococcal-Unspecified 12/31/2017   Unspecified SARS-COV-2 Vaccination 12/02/2019   Pertinent  Health Maintenance Due  Topic Date Due   FOOT EXAM  Never done   OPHTHALMOLOGY EXAM  Never done   COLONOSCOPY  Never done   PNA vac Low Risk Adult (2 of 2 - PCV13) 01/01/2019   INFLUENZA VACCINE  12/01/2019   HEMOGLOBIN A1C  12/03/2019   Fall Risk  03/06/2020  Falls in the past year? 0  Number falls in past yr: 0  Injury with Fall? 0   Functional Status Survey:    Vitals:   03/06/20 1114  BP: 122/82  Pulse: 67  Temp: (!) 97.5 F (36.4 C)  SpO2: 98%  Weight: 171 lb (77.6 kg)  Height: '6\' 2"'  (1.88 m)   Body mass index is 21.96 kg/m. Physical Exam Vitals reviewed.  Constitutional:      General: He is not in acute distress.    Appearance: He is normal weight. He is not ill-appearing.  HENT:     Head: Normocephalic.     Right Ear: Tympanic membrane, ear canal and external ear normal. There is no impacted cerumen.     Left Ear: Tympanic membrane, ear canal and external ear normal. There is no impacted cerumen.     Nose: Nose normal. No congestion or rhinorrhea.     Mouth/Throat:     Mouth: Mucous membranes are moist.     Pharynx: Oropharynx is clear. No oropharyngeal exudate or posterior oropharyngeal erythema.  Eyes:     General: No scleral icterus.       Right eye: No discharge.         Left eye: No discharge.     Conjunctiva/sclera: Conjunctivae normal.     Pupils: Pupils are equal, round, and reactive to light.  Neck:     Vascular: No carotid bruit.  Cardiovascular:     Rate and Rhythm: Normal rate and regular rhythm.     Pulses: Normal pulses.     Heart sounds: Normal heart sounds. No murmur heard.  No friction rub. No gallop.   Pulmonary:     Effort: Pulmonary effort is normal. No  respiratory distress.     Breath sounds: Normal breath sounds. No wheezing, rhonchi or rales.  Chest:     Chest wall: No tenderness.  Abdominal:     General: Bowel sounds are normal. There is no distension.     Palpations: Abdomen is soft. There is no mass.     Tenderness: There is no abdominal tenderness. There is no right CVA tenderness, left CVA tenderness, guarding or rebound.  Musculoskeletal:        General: No swelling or tenderness.     Cervical back: Normal range of motion. No rigidity or tenderness.     Right lower leg: No edema.     Left lower leg: No edema.     Comments: On wheelchair requires assistance with transfer   Feet:     Right foot:     Skin integrity: Skin integrity normal.     Toenail Condition: Right toenails are abnormally thick and long.     Left foot:     Skin integrity: Skin integrity normal.     Toenail Condition: Left toenails are abnormally thick and long.  Lymphadenopathy:     Cervical: No cervical adenopathy.  Skin:    General: Skin is warm.     Coloration: Skin is not pale.     Findings: No bruising, erythema or rash.     Comments: Sacral previous Pressure ulcer site pink in color and intact.  Neurological:     Mental Status: He is alert.     Motor: Weakness present.     Gait: Gait abnormal.     Comments: Follows simple commands but nonverbal.   Psychiatric:        Mood and Affect: Mood normal.        Speech: He is noncommunicative.        Behavior: Behavior normal.    Labs reviewed: Recent Labs    06/05/19 1822 06/06/19 0441  11/21/19 0147 11/21/19 0326 11/22/19 1145 11/22/19 1145 11/23/19 0255 12/09/19 0000 02/23/20 1607  NA  --    < > 143   < > 145   < > 143 139 137  K  --    < > 3.7   < > 3.8   < > 4.6 3.7 3.6  CL  --    < > 109   < > 113*   < > 112* 101 102  CO2  --    < > 25   < > 21*   < > 22 24* 26  GLUCOSE  --    < > 121*   < > 180*  --  90  --  112*  BUN  --    < > 24*   < > 16   < > 10 14 31*  CREATININE 0.90   < > 1.22   < > 1.11   < > 0.90 1.1 1.81*  CALCIUM 9.3   < > 9.5   < > 9.3   < > 9.2 9.4 9.5  MG 2.1  --  2.0  --   --   --   --   --   --   PHOS 2.9  --   --   --   --   --   --   --   --    < > = values in this interval not displayed.   Recent Labs    11/20/19 1802 11/21/19 0147 02/23/20 1607  AST '21 15 15  ' ALT  '25 23 21  ' ALKPHOS 89 82 70  BILITOT 1.1 1.0 1.0  PROT 7.1 6.8 6.5  ALBUMIN 4.2 3.9 3.7   Recent Labs    11/26/19 0305 11/26/19 0305 11/27/19 0255 12/09/19 0000 02/23/20 1844  WBC 6.7   < > 6.1 5.2 5.7  NEUTROABS 4.4  --  3.8  --  3.5  HGB 12.7*   < > 11.1* 12.4* 12.0*  HCT 38.2*   < > 33.9* 36* 36.9*  MCV 91.0  --  90.4  --  91.3  PLT 239   < > 208 225 228   < > = values in this interval not displayed.   Lab Results  Component Value Date   TSH 1.120 06/05/2019   Lab Results  Component Value Date   HGBA1C 6.7 (H) 06/05/2019   Lab Results  Component Value Date   CHOL 119 03/06/2019   HDL 36 (L) 03/06/2019   LDLCALC 68 03/06/2019   TRIG 76 03/06/2019   CHOLHDL 3.3 03/06/2019    Significant Diagnostic Results in last 30 days:  DG Tibia/Fibula Right  Result Date: 02/23/2020 CLINICAL DATA:  Pain EXAM: RIGHT TIBIA AND FIBULA - 2 VIEW COMPARISON:  None. FINDINGS: There is no evidence of fracture or other focal bone lesions. Soft tissues are unremarkable. IMPRESSION: Negative. Electronically Signed   By: Constance Holster M.D.   On: 02/23/2020 15:33   DG Ankle Complete Right  Result Date: 02/23/2020 CLINICAL DATA:  Pain EXAM: RIGHT ANKLE - COMPLETE  3+ VIEW COMPARISON:  None. FINDINGS: There is no evidence of fracture, dislocation, or joint effusion. There is no evidence of arthropathy or other focal bone abnormality. Soft tissues are unremarkable. IMPRESSION: Negative. Electronically Signed   By: Constance Holster M.D.   On: 02/23/2020 15:35   CT Head Wo Contrast  Result Date: 02/23/2020 CLINICAL DATA:  Neuro deficit, acute stroke suspected. EXAM: CT HEAD WITHOUT CONTRAST TECHNIQUE: Contiguous axial images were obtained from the base of the skull through the vertex without intravenous contrast. COMPARISON:  CT 11/28/2019 FINDINGS: Brain: Similar appearance of extensive encephalomalacia associated with a chronic large left MCA and PCA territory infarct. No evidence of new/acute large vascular territory infarct. No acute hemorrhage. Additional scattered white matter hypoattenuation likely represents the sequela of chronic microvascular ischemic disease. Similar left lateral ventricle ex vacuo ventricular dilation without evidence of hydrocephalus. No mass lesion or abnormal mass effect. Generalized cerebral atrophy. Vascular: Calcific atherosclerosis. Skull: No acute fracture. Sinuses/Orbits: Visualized sinuses are largely clear. Unremarkable orbits. Other: No mastoid effusions. IMPRESSION: 1. No evidence of acute intracranial abnormality. 2. Similar extensive encephalomalacia associated with a chronic large left MCA and PCA territory infarct. 3. Similar background of chronic microvascular ischemic disease and generalized atrophy. Electronically Signed   By: Margaretha Sheffield MD   On: 02/23/2020 15:11   CT Knee Right Wo Contrast  Result Date: 02/23/2020 CLINICAL DATA:  Right leg pain. EXAM: CT OF THE RIGHT KNEE WITHOUT CONTRAST TECHNIQUE: Multidetector CT imaging of the RIGHT knee was performed according to the standard protocol. Multiplanar CT image reconstructions were also generated. COMPARISON:  Radiograph earlier today. FINDINGS:  Bones/Joint/Cartilage No acute fracture. No dislocation. Tricompartmental osteoarthritis with peripheral spurring including fragmented spurs centrally adjacent the tibial spines. Well corticated density in the posteromedial joint space may represent a fragmented osteophyte or intra-articular body. The area of subchondral sclerosis in the medial tibial plateau on radiograph likely is secondary to degenerative subchondral sclerotic change. There is tricompartmental joint space narrowing. No bony  destruction or periosteal reaction. The bones are diffusely under mineralized. There is no focal bone lesion or erosion. No CT findings of avascular necrosis. Small knee joint effusion. No lipohemarthrosis. Ligaments Suboptimally assessed by CT. Muscles and Tendons Mild fatty atrophy of the calf musculature. No evidence of focal lesion or hematoma. Soft tissues Minimal skin thickening in the lateral aspect of the anterior proximal calf is nonspecific. There is no subjacent inflammatory change. No focal fluid collection. Vascular calcifications are noted. IMPRESSION: 1. Tricompartmental osteoarthritis with peripheral spurring. Well corticated density in the posteromedial joint space may represent a fragmented osteophyte or intra-articular body. 2. No acute osseous abnormality. Subchondral sclerosis on radiograph is likely related to degenerative change. 3. Minimal skin thickening in the lateral aspect of the anterior proximal calf is nonspecific. No subjacent inflammatory change. 4. Small knee joint effusion. Electronically Signed   By: Keith Rake M.D.   On: 02/23/2020 19:05   DG Knee Complete 4 Views Right  Result Date: 02/23/2020 CLINICAL DATA:  Pain EXAM: RIGHT KNEE - COMPLETE 4+ VIEW COMPARISON:  None. FINDINGS: There is no acute displaced fracture or dislocation. There is an area of sclerosis involving the medial tibial plateau best visualized on the frontal view. There are tricompartmental degenerative changes.  There is osteopenia. There is a small joint effusion. IMPRESSION: 1. No acute displaced fracture or dislocation. 2. Nonspecific area of sclerosis involving the medial tibial plateau. This may represent a nondisplaced fracture in the appropriate clinical setting. This can be further evaluated with cross-sectional imaging as clinically indicated. 3. Tricompartmental degenerative changes are noted. 4. Small joint effusion. Electronically Signed   By: Constance Holster M.D.   On: 02/23/2020 15:37   DG Foot Complete Right  Result Date: 02/23/2020 CLINICAL DATA:  Pain EXAM: RIGHT FOOT COMPLETE - 3+ VIEW COMPARISON:  None. FINDINGS: There is no evidence of fracture or dislocation. There is no evidence of arthropathy or other focal bone abnormality. Soft tissues are unremarkable. IMPRESSION: Negative. Electronically Signed   By: Constance Holster M.D.   On: 02/23/2020 15:34   CUP PACEART REMOTE DEVICE CHECK  Result Date: 02/28/2020 ILR summary report received. Battery status OK. Normal device function. No new symptom, tachy, brady, or pause episodes. No new AF episodes. Monthly summary reports and ROV/PRN   Assessment/Plan 1. Delayed orthostatic hypotension B/p stable. Continue on midodrine. - midodrine (PROAMATINE) 5 MG tablet; Take 1 tablet (5 mg total) by mouth in the morning, at noon, and at bedtime. FOR ORTHOSTATIC HYPOTENSION  Dispense: 90 tablet; Refill: 1  2. Hypertension associated with diabetes ([place]) B/p stable.continue on Amlodipine,indapamide,Lisinopril  and Coreg . - continue to monitor B/p at home.Advised daughter to notify provider for B/p < 90/60 or > 140/90  - amLODipine (NORVASC) 10 MG tablet; Take 1 tablet (10 mg total) by mouth daily.  Dispense: 90 tablet; Refill: 1 - CBC with Differential/Platelet - CMP with eGFR([facility]) - TSH  3. Late effects of CVA (cerebrovascular accident) Status post hospital admission from 11/20/19 - 11/27/2019 with acute intracranial hemorrhage or  infarct large left MCA and PCA territory infarct.Repeat Ct scan was unremarkable. - continue to follow up with Neurologist as directed. - B/p well controlled.  - continue on Amlodipine,indapamide,Lisinopril  and Coreg - continue on ASA and Plavix  - coughing reported during meals per daughter did well with speech therapist.Will reorder Home health Speech therapy.  - amLODipine (NORVASC) 10 MG tablet; Take 1 tablet (10 mg total) by mouth daily.  Dispense: 90 tablet; Refill: 1  4. Partial symptomatic epilepsy with complex partial seizures, not intractable, without status epilepticus ([place]) Status post hospital admission as above with seizures Neurology started on Lamictal. - continue to follow up with Neurologist as directed.  - lamoTRIgine (LAMICTAL) 150 MG tablet; Take 1 tablet (150 mg total) by mouth 2 (two) times daily.  Dispense: 90 tablet; Refill: 1  5. Hyperlipidemia associated with type 2 diabetes mellitus (HCC) LDL at goal.  - continue on Atorvastatin 40 mg tablet daily and Omega-3 fatty Acids  - Lipid Panel  6. Type 2 diabetes mellitus with peripheral neuropathy (HCC) Lab Results  Component Value Date   HGBA1C 6.7 (H) 06/05/2019  CBG well controlled. - continue on Novolog per SSI with meals and Levemir 16 units daily. -  Ambulatory referral to Podiatry for foot exam and trim  Long nails.  - continue on BBB and Statin and ASA - Hemoglobin A1c  7. Overgrown toenails Bilateral toenails thick and overgrown. - Ambulatory referral to Podiatry to trim overgrown toenails and annual diabetic foot exam.  8. History of Dysphagia, post-stroke Continues to cough during meals. - Ambulatory referral to [place] for speech Therapy for evaluation of swallowing.   9. Insomnia due to medical condition Not sleeping at night yelling and pointing on the wall or ceiling Will try Trazodone as below.side effects discussed with daughter.  - traZODone (DESYREL) 50 MG tablet; Take 0.5-1 tablets  (25-50 mg total) by mouth at bedtime as needed for sleep.  Dispense: 30 tablet; Refill: 3  10. Full code status Code status Full Code with full scope of treatment.   Family/ staff Communication: Reviewed plan of care with patient and Daughter   Labs/tests ordered:  - CBC with Differential/Platelet - CMP with eGFR([facility]) - TSH - Hemoglobin A1C  Next Appointment : 4 months for medical management of chronic issues.   Sandrea Hughs, NP

## 2020-03-07 LAB — CBC WITH DIFFERENTIAL/PLATELET
Absolute Monocytes: 364 cells/uL (ref 200–950)
Basophils Absolute: 28 cells/uL (ref 0–200)
Basophils Relative: 0.5 %
Eosinophils Absolute: 90 cells/uL (ref 15–500)
Eosinophils Relative: 1.6 %
HCT: 40.7 % (ref 38.5–50.0)
Hemoglobin: 13.6 g/dL (ref 13.2–17.1)
Lymphs Abs: 1193 cells/uL (ref 850–3900)
MCH: 30.2 pg (ref 27.0–33.0)
MCHC: 33.4 g/dL (ref 32.0–36.0)
MCV: 90.2 fL (ref 80.0–100.0)
MPV: 10.3 fL (ref 7.5–12.5)
Monocytes Relative: 6.5 %
Neutro Abs: 3926 cells/uL (ref 1500–7800)
Neutrophils Relative %: 70.1 %
Platelets: 258 10*3/uL (ref 140–400)
RBC: 4.51 10*6/uL (ref 4.20–5.80)
RDW: 12.5 % (ref 11.0–15.0)
Total Lymphocyte: 21.3 %
WBC: 5.6 10*3/uL (ref 3.8–10.8)

## 2020-03-07 LAB — COMPLETE METABOLIC PANEL WITH GFR
AG Ratio: 1.7 (calc) (ref 1.0–2.5)
ALT: 30 U/L (ref 9–46)
AST: 17 U/L (ref 10–35)
Albumin: 4.4 g/dL (ref 3.6–5.1)
Alkaline phosphatase (APISO): 87 U/L (ref 35–144)
BUN/Creatinine Ratio: 20 (calc) (ref 6–22)
BUN: 30 mg/dL — ABNORMAL HIGH (ref 7–25)
CO2: 28 mmol/L (ref 20–32)
Calcium: 9.8 mg/dL (ref 8.6–10.3)
Chloride: 102 mmol/L (ref 98–110)
Creat: 1.49 mg/dL — ABNORMAL HIGH (ref 0.70–1.25)
GFR, Est African American: 55 mL/min/{1.73_m2} — ABNORMAL LOW (ref >=60)
GFR, Est Non African American: 47 mL/min/{1.73_m2} — ABNORMAL LOW (ref >=60)
Globulin: 2.6 g/dL (calc) (ref 1.9–3.7)
Glucose, Bld: 141 mg/dL — ABNORMAL HIGH (ref 65–139)
Potassium: 4.4 mmol/L (ref 3.5–5.3)
Sodium: 141 mmol/L (ref 135–146)
Total Bilirubin: 0.8 mg/dL (ref 0.2–1.2)
Total Protein: 7 g/dL (ref 6.1–8.1)

## 2020-03-07 LAB — LIPID PANEL
Cholesterol: 114 mg/dL (ref <200)
HDL: 35 mg/dL — ABNORMAL LOW (ref >=40)
LDL Cholesterol (Calc): 63 mg/dL (calc)
Non-HDL Cholesterol (Calc): 79 mg/dL (calc) (ref <130)
Total CHOL/HDL Ratio: 3.3 (calc) (ref <5.0)
Triglycerides: 84 mg/dL (ref <150)

## 2020-03-07 LAB — HEMOGLOBIN A1C
Hgb A1c MFr Bld: 6.5 % of total Hgb — ABNORMAL HIGH (ref <5.7)
Mean Plasma Glucose: 140 (calc)
eAG (mmol/L): 7.7 (calc)

## 2020-03-07 LAB — TSH: TSH: 2.07 mIU/L (ref 0.40–4.50)

## 2020-03-10 ENCOUNTER — Emergency Department (HOSPITAL_COMMUNITY)
Admission: EM | Admit: 2020-03-10 | Discharge: 2020-03-11 | Disposition: A | Discharge status: Home / Self Care | Payer: Medicare Other | Attending: Emergency Medicine | Admitting: Emergency Medicine

## 2020-03-10 ENCOUNTER — Encounter (HOSPITAL_COMMUNITY): Payer: Self-pay

## 2020-03-10 ENCOUNTER — Ambulatory Visit: Payer: Medicare Other | Admitting: Family

## 2020-03-10 ENCOUNTER — Emergency Department (HOSPITAL_COMMUNITY): Payer: Medicare Other

## 2020-03-10 ENCOUNTER — Other Ambulatory Visit: Payer: Self-pay

## 2020-03-10 DIAGNOSIS — R4182 Altered mental status, unspecified: Secondary | ICD-10-CM | POA: Insufficient documentation

## 2020-03-10 DIAGNOSIS — Z7984 Long term (current) use of oral hypoglycemic drugs: Secondary | ICD-10-CM | POA: Insufficient documentation

## 2020-03-10 DIAGNOSIS — Z7902 Long term (current) use of antithrombotics/antiplatelets: Secondary | ICD-10-CM | POA: Insufficient documentation

## 2020-03-10 DIAGNOSIS — I69351 Hemiplegia and hemiparesis following cerebral infarction affecting right dominant side: Secondary | ICD-10-CM | POA: Insufficient documentation

## 2020-03-10 DIAGNOSIS — R0989 Other specified symptoms and signs involving the circulatory and respiratory systems: Secondary | ICD-10-CM | POA: Insufficient documentation

## 2020-03-10 DIAGNOSIS — Z20822 Contact with and (suspected) exposure to covid-19: Secondary | ICD-10-CM | POA: Insufficient documentation

## 2020-03-10 DIAGNOSIS — I10 Essential (primary) hypertension: Secondary | ICD-10-CM | POA: Insufficient documentation

## 2020-03-10 DIAGNOSIS — I69320 Aphasia following cerebral infarction: Secondary | ICD-10-CM | POA: Diagnosis not present

## 2020-03-10 DIAGNOSIS — Z7982 Long term (current) use of aspirin: Secondary | ICD-10-CM | POA: Diagnosis not present

## 2020-03-10 DIAGNOSIS — E119 Type 2 diabetes mellitus without complications: Secondary | ICD-10-CM | POA: Insufficient documentation

## 2020-03-10 DIAGNOSIS — Z794 Long term (current) use of insulin: Secondary | ICD-10-CM | POA: Diagnosis not present

## 2020-03-10 LAB — COMPREHENSIVE METABOLIC PANEL
ALT: 27 U/L (ref 0–44)
AST: 16 U/L (ref 15–41)
Albumin: 4 g/dL (ref 3.5–5.0)
Alkaline Phosphatase: 79 U/L (ref 38–126)
Anion gap: 11 (ref 5–15)
BUN: 38 mg/dL — ABNORMAL HIGH (ref 8–23)
CO2: 28 mmol/L (ref 22–32)
Calcium: 9.7 mg/dL (ref 8.9–10.3)
Chloride: 100 mmol/L (ref 98–111)
Creatinine, Ser: 1.87 mg/dL — ABNORMAL HIGH (ref 0.61–1.24)
GFR, Estimated: 38 mL/min — ABNORMAL LOW (ref >60)
Glucose, Bld: 132 mg/dL — ABNORMAL HIGH (ref 70–99)
Potassium: 3.8 mmol/L (ref 3.5–5.1)
Sodium: 139 mmol/L (ref 135–145)
Total Bilirubin: 0.9 mg/dL (ref 0.3–1.2)
Total Protein: 6.9 g/dL (ref 6.5–8.1)

## 2020-03-10 LAB — CBC WITH DIFFERENTIAL/PLATELET
Abs Immature Granulocytes: 0.02 10*3/uL (ref 0.00–0.07)
Basophils Absolute: 0 10*3/uL (ref 0.0–0.1)
Basophils Relative: 0 %
Eosinophils Absolute: 0.1 10*3/uL (ref 0.0–0.5)
Eosinophils Relative: 1 %
HCT: 40.1 % (ref 39.0–52.0)
Hemoglobin: 12.7 g/dL — ABNORMAL LOW (ref 13.0–17.0)
Immature Granulocytes: 0 %
Lymphocytes Relative: 17 %
Lymphs Abs: 1.4 10*3/uL (ref 0.7–4.0)
MCH: 29.5 pg (ref 26.0–34.0)
MCHC: 31.7 g/dL (ref 30.0–36.0)
MCV: 93.3 fL (ref 80.0–100.0)
Monocytes Absolute: 0.5 10*3/uL (ref 0.1–1.0)
Monocytes Relative: 7 %
Neutro Abs: 6.1 10*3/uL (ref 1.7–7.7)
Neutrophils Relative %: 75 %
Platelets: 258 10*3/uL (ref 150–400)
RBC: 4.3 MIL/uL (ref 4.22–5.81)
RDW: 13.4 % (ref 11.5–15.5)
WBC: 8.2 10*3/uL (ref 4.0–10.5)
nRBC: 0 % (ref 0.0–0.2)

## 2020-03-10 LAB — I-STAT VENOUS BLOOD GAS, ED
Acid-Base Excess: 3 mmol/L — ABNORMAL HIGH (ref 0.0–2.0)
Acid-Base Excess: 6 mmol/L — ABNORMAL HIGH (ref 0.0–2.0)
Bicarbonate: 30.5 mmol/L — ABNORMAL HIGH (ref 20.0–28.0)
Bicarbonate: 33.5 mmol/L — ABNORMAL HIGH (ref 20.0–28.0)
Calcium, Ion: 1.24 mmol/L (ref 1.15–1.40)
Calcium, Ion: 1.24 mmol/L (ref 1.15–1.40)
HCT: 38 % — ABNORMAL LOW (ref 39.0–52.0)
HCT: 39 % (ref 39.0–52.0)
Hemoglobin: 12.9 g/dL — ABNORMAL LOW (ref 13.0–17.0)
Hemoglobin: 13.3 g/dL (ref 13.0–17.0)
O2 Saturation: 66 %
O2 Saturation: 77 %
Potassium: 3.7 mmol/L (ref 3.5–5.1)
Potassium: 3.7 mmol/L (ref 3.5–5.1)
Sodium: 140 mmol/L (ref 135–145)
Sodium: 141 mmol/L (ref 135–145)
TCO2: 32 mmol/L (ref 22–32)
TCO2: 35 mmol/L — ABNORMAL HIGH (ref 22–32)
pCO2, Ven: 57.9 mmHg (ref 44.0–60.0)
pCO2, Ven: 62.6 mmHg — ABNORMAL HIGH (ref 44.0–60.0)
pH, Ven: 7.33 (ref 7.250–7.430)
pH, Ven: 7.337 (ref 7.250–7.430)
pO2, Ven: 38 mmHg (ref 32.0–45.0)
pO2, Ven: 46 mmHg — ABNORMAL HIGH (ref 32.0–45.0)

## 2020-03-10 LAB — I-STAT ARTERIAL BLOOD GAS, ED
Acid-Base Excess: 2 mmol/L (ref 0.0–2.0)
Bicarbonate: 27.8 mmol/L (ref 20.0–28.0)
Calcium, Ion: 1.28 mmol/L (ref 1.15–1.40)
HCT: 37 % — ABNORMAL LOW (ref 39.0–52.0)
Hemoglobin: 12.6 g/dL — ABNORMAL LOW (ref 13.0–17.0)
O2 Saturation: 95 %
Patient temperature: 98.6
Potassium: 3.9 mmol/L (ref 3.5–5.1)
Sodium: 139 mmol/L (ref 135–145)
TCO2: 29 mmol/L (ref 22–32)
pCO2 arterial: 49.8 mmHg — ABNORMAL HIGH (ref 32.0–48.0)
pH, Arterial: 7.355 (ref 7.350–7.450)
pO2, Arterial: 80 mmHg — ABNORMAL LOW (ref 83.0–108.0)

## 2020-03-10 LAB — RESPIRATORY PANEL BY RT PCR (FLU A&B, COVID)
Influenza A by PCR: NEGATIVE
Influenza B by PCR: NEGATIVE
SARS Coronavirus 2 by RT PCR: NEGATIVE

## 2020-03-10 LAB — CBG MONITORING, ED: Glucose-Capillary: 132 mg/dL — ABNORMAL HIGH (ref 70–99)

## 2020-03-10 MED ORDER — SODIUM CHLORIDE 0.9 % IV BOLUS
500.0000 mL | Freq: Once | INTRAVENOUS | Status: AC
  Administered 2020-03-10: 500 mL via INTRAVENOUS

## 2020-03-10 NOTE — ED Triage Notes (Signed)
Pt BIB EMS from home. Pts daughter reports hearing choking noises coming from pts room around 3am this morning. Daughter unsure if pt spit up anything, but chocking noises ceased. Daughter states that pt is not acting like himself and would not eat supper for her like usual. Pt has CVA history with right sided deficits and aphasia.   Vitals: BP: 133/70 HR: 45 RR:14 SPO2: 100 CBG: 117

## 2020-03-10 NOTE — ED Notes (Signed)
Pts daughter called out to say that pt was not following her with his eyes anymore and "not acting normal" again. Pt notably diaphoretic, unable to keep mouth closed and staring into space. MD made aware of situation. CBG done & CT scan ordered.

## 2020-03-10 NOTE — ED Notes (Signed)
Pt transported to CT ?

## 2020-03-10 NOTE — ED Notes (Signed)
Pt given applesauce and a sandwich bag. Daughter at bedside with pt. Pt tolerating food well.

## 2020-03-10 NOTE — Discharge Instructions (Addendum)
The testing today does not show any serious problems.  Since he had a choking episode you need to watch for signs and symptoms of pneumonia including fever, cough producing green or yellow sputum.  Encourage him to drink plenty of fluids and eat regularly.  Try to work on his sleep pattern to get him to sleep more at nighttime and stay awake during the day.

## 2020-03-10 NOTE — ED Provider Notes (Signed)
[facility] EMERGENCY DEPARTMENT Provider Note   CSN: [id number] Arrival date & time: 03/10/20  1902     History No chief complaint on file.   [name]. is a 69 y.o. male.  HPI Patient brought in by EMS for evaluation of a choking episode this morning around 3 AM.  Evaluated by EMS at that time.  According to his daughter, his oxygen level was 70% when they first checked it.  He was treated with oxygen for short period of time, then they turned it off because his oxygen level became normal.  This evening his daughter was concerned because he was not acting like himself and would not eat supper.  She felt that he was blankly staring into space, and not responsive.  The patient is aphasic, with right-sided weakness.  Patient evaluated by me at 8:05 PM.  No one in the room with him at this time.  Level 5 caveat-aphasia    Past Medical History:  Diagnosis Date  . Acute blood loss anemia   . Acute embolic stroke (HCC)   . Acute lower UTI   . AMS (altered mental status) 06/05/2019  . Cerebral edema (HCC) 04/04/2018  . Diabetes 1.5, managed as type 2 (HCC)   . Diabetes mellitus without complication (HCC)   . High cholesterol   . High glucose    Diabetes  . History of CVA with residual deficit   . History of Dysphagia, post-stroke   . Hypertension   . Ischemic stroke (HCC) 03/05/2019  . Labile blood glucose   . 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 Renal insufficiency 04/04/2018  . Seizures (HCC)   . Stroke (HCC)   . UTI (urinary tract infection) 03/05/2019    Patient Active Problem List   Diagnosis Date Noted  . Slow transit constipation 12/26/2019  . Seizure (HCC) 11/20/2019  . Seizures (HCC) 11/20/2019  . Encephalopathy 11/19/2019  . Delayed orthostatic hypotension 08/04/2019  . Seizure disorder (HCC) 07/21/2019  . 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 Shoulder subluxation, right, sequela 07/16/2019  .  Altered mental status 07/15/2019  . 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 Late effects of CVA (cerebrovascular accident)   . Pressure injury of skin 06/08/2019  . History of loop recorder 03/08/2019  . UTI (urinary tract infection) 03/05/2019  . 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 Global aphasia   . Type 2 diabetes mellitus with peripheral neuropathy (HCC)   . Hypertension associated with diabetes (HCC)   . Hyperlipidemia associated with type 2 diabetes mellitus (HCC)   . History of Dysphagia, post-stroke     Past Surgical History:  Procedure Laterality Date  . IR PATIENT EVAL TECH 0-60 MINS  03/30/2018  . LOOP RECORDER INSERTION N/A 03/08/2019   Procedure: LOOP RECORDER INSERTION;  Surgeon: Regan Lemming, MD;  Location: [facility] INVASIVE CV LAB;  Service: Cardiovascular;  Laterality: N/A;  . TEE WITHOUT CARDIOVERSION N/A 04/02/2018   Procedure: TRANSESOPHAGEAL ECHOCARDIOGRAM (TEE);  Surgeon: [name], MD;  Location: [facility] ENDOSCOPY;  Service: Cardiovascular;  Laterality: N/A;       Family History  Problem Relation Age of Onset  . Leukemia Mother   . Dementia Father     Social History   Tobacco Use  . Smoking status: Never Smoker  . Smokeless tobacco: Never Used  Vaping Use  . Vaping Use: Never used  Substance Use  Topics  . Alcohol use: Not Currently  . Drug use: Never    Home Medications Prior to Admission medications   Medication Sig Start Date End Date Taking? Authorizing Provider  traZODone (DESYREL) 50 MG tablet Take 0.5-1 tablets (25-50 mg total) by mouth at bedtime as needed for sleep. 03/06/20  Yes Ngetich, Dinah C, NP  amLODipine (NORVASC) 10 MG tablet Take 1 tablet (10 mg total) by mouth daily. 03/06/20   Ngetich, Dinah C, NP  ascorbic acid (VITAMIN C) 500 MG tablet Take 1 tablet (500 mg total) by mouth daily as needed (if cold-like symptoms are present). 08/30/19   Margit Hanks, MD   aspirin EC 325 MG tablet Take 325 mg by mouth every morning.    [provider]  atorvastatin (LIPITOR) 40 MG tablet Take 1 tablet (40 mg total) by mouth daily at 6 (six) AM. 12/26/19   Ngetich, Dinah C, NP  baclofen (LIORESAL) 20 MG tablet Take 1 tablet (20 mg total) by mouth 2 (two) times daily. 03/06/20   Ngetich, Dinah C, NP  bisacodyl (DULCOLAX) 10 MG suppository Place 1 suppository (10 mg total) rectally as needed for moderate constipation. 12/26/19   Ngetich, Dinah C, NP  carvedilol (COREG) 12.5 MG tablet Take 1 tablet (12.5 mg total) by mouth 2 (two) times daily with a meal. 12/27/19   Ngetich, Dinah C, NP  clopidogrel (PLAVIX) 75 MG tablet Take 1 tablet (75 mg total) by mouth daily. 12/26/19   Ngetich, Dinah C, NP  glipiZIDE (GLUCOTROL) 10 MG tablet Take 1 tablet (10 mg total) by mouth 2 (two) times daily with a meal. 12/26/19   Ngetich, Dinah C, NP  indapamide (LOZOL) 1.25 MG tablet Take 1 tablet (1.25 mg total) by mouth daily. 12/27/19   Ngetich, Dinah C, NP  insulin aspart (NOVOLOG) 100 UNIT/ML injection Inject 2-15 Units into the skin 4 (four) times daily -  before meals and at bedtime. SSI:  121-150 = 2 units; 151-200 = 3 units, 201-250 = 5 units, 251-300 = 8 units, 301-350 = 11 units, 351-300 = 15 units, greater than 400 = 15 units and call MD 12/26/19   Ngetich, Dinah C, NP  insulin detemir (LEVEMIR) 100 UNIT/ML injection Inject 0.16 mLs (16 Units total) into the skin daily. If CBG >125 (per daughter) 12/26/19   Ngetich, Donalee Citrin, NP  lamoTRIgine (LAMICTAL) 150 MG tablet Take 1 tablet (150 mg total) by mouth 2 (two) times daily. 03/06/20   Ngetich, Dinah C, NP  lisinopril (ZESTRIL) 20 MG tablet Take 1 tablet (20 mg total) by mouth 2 (two) times daily. 12/26/19   Ngetich, Dinah C, NP  liver oil-zinc oxide (DESITIN) 40 % ointment Apply 1 application topically 2 (two) times daily as needed for irritation. 12/26/19   Ngetich, Dinah C, NP  midodrine (PROAMATINE) 5 MG tablet Take 1 tablet (5 mg  total) by mouth in the morning, at noon, and at bedtime. FOR ORTHOSTATIC HYPOTENSION 03/06/20   Ngetich, Dinah C, NP  Omega-3 Fatty Acids (OMEGA-3 FISH OIL PO) Take by mouth.    [provider]  pantoprazole (PROTONIX) 40 MG tablet Take 1 tablet (40 mg total) by mouth daily. 12/26/19   Ngetich, Dinah C, NP  senna-docusate (SENOKOT-S) 8.6-50 MG tablet Take 1 tablet by mouth daily. 12/26/19   Ngetich, Dinah C, NP  tamsulosin (FLOMAX) 0.4 MG CAPS capsule Take 1 capsule (0.4 mg total) by mouth daily after supper. 12/27/19   Ngetich, Donalee Citrin, NP  Vitamin D3 (VITAMIN  D) 25 MCG tablet Take 0.5 tablets (500 Units total) by mouth daily. 08/30/19   Margit HanksAlexander, Anne D, MD    Allergies    Ciprofloxacin, Keppra [levetiracetam], Avocado, and Metformin and related  Review of Systems   Review of Systems  Unable to perform ROS: Other    Physical Exam Updated Vital Signs BP 127/70   Pulse 75   Temp 98.4 F (36.9 C) (Oral)   Resp 14   SpO2 100%   Physical Exam Vitals and nursing note reviewed.  Constitutional:      General: He is not in acute distress.    Appearance: He is well-developed. He is not ill-appearing, toxic-appearing or diaphoretic.  HENT:     Head: Normocephalic and atraumatic.     Right Ear: External ear normal.     Left Ear: External ear normal.     Nose: No congestion or rhinorrhea.     Mouth/Throat:     Pharynx: No oropharyngeal exudate or posterior oropharyngeal erythema.  Eyes:     Conjunctiva/sclera: Conjunctivae normal.     Pupils: Pupils are equal, round, and reactive to light.  Neck:     Trachea: Phonation normal.  Cardiovascular:     Rate and Rhythm: Normal rate and regular rhythm.     Heart sounds: Normal heart sounds. No murmur heard.   Pulmonary:     Effort: Pulmonary effort is normal.     Breath sounds: Normal breath sounds.  Abdominal:     General: There is no distension.     Palpations: Abdomen is soft.     Tenderness: There is no abdominal  tenderness.  Musculoskeletal:        General: Normal range of motion.     Cervical back: Normal range of motion and neck supple.  Skin:    General: Skin is warm and dry.  Neurological:     Mental Status: He is alert.     Cranial Nerves: No cranial nerve deficit.     Motor: No abnormal muscle tone.     Coordination: Coordination normal.     Comments: He follows commands accurately.  He is nonverbal.  Right-sided spasticity of the arm, and right leg weakness.  He is able to move his left arm and left leg, fairly normally.  Psychiatric:        Mood and Affect: Mood normal.        Behavior: Behavior normal.     ED Results / Procedures / Treatments   Labs (all labs ordered are listed, but only abnormal results are displayed) Labs Reviewed  COMPREHENSIVE METABOLIC PANEL - Abnormal; Notable for the following components:      Result Value   Glucose, Bld 132 (*)    BUN 38 (*)    Creatinine, Ser 1.87 (*)    GFR, Estimated 38 (*)    All other components within normal limits  CBC WITH DIFFERENTIAL/PLATELET - Abnormal; Notable for the following components:   Hemoglobin 12.7 (*)    All other components within normal limits  CBG MONITORING, ED - Abnormal; Notable for the following components:   Glucose-Capillary 132 (*)    All other components within normal limits  I-STAT VENOUS BLOOD GAS, ED - Abnormal; Notable for the following components:   Bicarbonate 30.5 (*)    Acid-Base Excess 3.0 (*)    HCT 38.0 (*)    Hemoglobin 12.9 (*)    All other components within normal limits  I-STAT VENOUS BLOOD GAS, ED - Abnormal; Notable for  the following components:   pCO2, Ven 62.6 (*)    pO2, Ven 46.0 (*)    Bicarbonate 33.5 (*)    TCO2 35 (*)    Acid-Base Excess 6.0 (*)    All other components within normal limits  I-STAT ARTERIAL BLOOD GAS, ED - Abnormal; Notable for the following components:   pCO2 arterial 49.8 (*)    pO2, Arterial 80 (*)    HCT 37.0 (*)    Hemoglobin 12.6 (*)    All  other components within normal limits  RESPIRATORY PANEL BY RT PCR (FLU A&B, COVID)  BLOOD GAS, VENOUS  BLOOD GAS, ARTERIAL    EKG EKG Interpretation  Date/Time:  Tuesday March 10 2020 20:45:36 EST Ventricular Rate:  70 PR Interval:    QRS Duration: 83 QT Interval:  375 QTC Calculation: 405 R Axis:   74 Text Interpretation: Sinus rhythm Artifact since last tracing no significant change Confirmed by Mancel Bale [phone]) on 03/10/2020 8:49:36 PM   Radiology CT Head Wo Contrast  Result Date: 03/10/2020 CLINICAL DATA:  Altered level of consciousness, previous CVA with right-sided deficits EXAM: CT HEAD WITHOUT CONTRAST TECHNIQUE: Contiguous axial images were obtained from the base of the skull through the vertex without intravenous contrast. COMPARISON:  02/23/2020 FINDINGS: Brain: Chronic encephalomalacia within the left cerebral hemisphere related to previous left MCA and PCA territory infarcts. Chronic small vessel ischemic changes are again seen throughout the periventricular white matter. No acute infarct or hemorrhage. There is ex vacuo dilatation of the left lateral ventricle, stable. Midline structures are otherwise unremarkable. No acute extra-axial fluid collections. No mass effect. Vascular: No hyperdense vessel or unexpected calcification. Skull: Normal. Negative for fracture or focal lesion. Sinuses/Orbits: No acute finding. Other: None. IMPRESSION: 1. Stable chronic ischemic changes as above. No acute intracranial process. Electronically Signed   By: Sharlet Salina M.D.   On: 03/10/2020 22:00   DG Chest Port 1 View  Result Date: 03/10/2020 CLINICAL DATA:  Choking, altered mental status EXAM: PORTABLE CHEST 1 VIEW COMPARISON:  07/15/2019 FINDINGS: Lungs are clear. Mild pleuroparenchymal scarring at the right apex is unchanged. No pneumothorax or pleural effusion. Cardiac size within normal limits. Pulmonary vascularity is normal. Implanted loop recorder overlies the left lung  base. No acute bone abnormality. IMPRESSION: No active disease. Electronically Signed   By: Helyn Numbers MD   On: 03/10/2020 20:31    Procedures Procedures (including critical care time)  Medications Ordered in ED Medications  sodium chloride 0.9 % bolus 500 mL (0 mLs Intravenous Stopped 03/10/20 2223)    ED Course  I have reviewed the triage vital signs and the nursing notes.  Pertinent labs & imaging results that were available during my care of the patient were reviewed by me and considered in my medical decision making (see chart for details).  Clinical Course as of Mar 11 2327  Tue Mar 10, 2020  2131 I was called to the room because the patient's daughter states "he is not responsive now."  She has been trying to get him to acknowledge her and move his eyes but he has not been able to.  At this time the patient is sitting with eyes closed and does not open them, or move his mouth on command as he did earlier.  CBG now is 132, slightly elevated.  He is maintaining his airway and coughing occasionally.  Mental status is changed from when he was seen earlier by me.  Head CT ordered to evaluate for stroke.  No seizure activity has been seen.  He takes lamotrigine for seizures.   [EW]  2235 Patient's daughter states that he is more like himself now.  She also states he has not been sleeping well for several weeks, and typically stays up all night, and naps during the day.  She would like to try to give him some food now.   [EW]  2255 The patient is now more alert, eating and drinking on his own using his left hand.  Since venous PCO2 is elevated, I have ordered an arterial blood gas to assess for true PCO2 level.   [EW]  2312 Very mild PCO2 elevation, not requiring treatment  I-Stat arterial blood gas, ED(!) [EW]    Clinical Course User Index [EW] Mancel Bale, MD   MDM Rules/Calculators/A&P                           Patient Vitals for the past 24 hrs:  BP Temp Temp src Pulse  Resp SpO2  03/10/20 2315 127/70 -- -- 75 14 100 %  03/10/20 2245 124/68 -- -- 75 12 99 %  03/10/20 2230 105/63 -- -- 70 10 100 %  03/10/20 2158 116/61 -- -- 67 13 100 %  03/10/20 2100 119/73 -- -- 71 12 100 %  03/10/20 2030 110/64 -- -- 71 14 100 %  03/10/20 1945 123/64 -- -- 71 17 99 %  03/10/20 1920 125/66 98.4 F (36.9 C) Oral 70 16 99 %    11:28 PM Reevaluation with update and discussion. After initial assessment and treatment, an updated evaluation reveals no change in clinical status, findings discussed with patient's daughter and all questions were answered. Mancel Bale   Medical Decision Making:  This patient is presenting for evaluation of choking episode, which does require a range of treatment options, and is a complaint that involves a high risk of morbidity and mortality. The differential diagnoses include aspiration, choking, occult illness. I decided to review old records, and in summary elderly male, lives at home with family members and has aphasia and right hemiparesis.  He reportedly had a choking episode that was brief earlier this morning.  He has been less like himself than usual, prompting the ED visit this evening.  I obtained additional historical information from daughter at the bedside.  Clinical Laboratory Tests Ordered, included CBC, Metabolic panel and Venous blood gas. Review indicates hemoglobin slightly low 12.7, glucose high 132, BUN high 38, creatinine high 1.87, PCO2 on venous gas elevated, bicarb and venous gas elevated, pH on venous gas normal. Radiologic Tests Ordered, included chest x-ray.  I independently Visualized: CT head, chest x-ray images, which show no acute abnormalities    Critical Interventions-clinical evaluation, laboratory testing, observation, CT imaging, reevaluation, discussion with family member  After These Interventions, the Patient was reevaluated and was found improved, without evidence for ongoing signs or symptoms of  aspiration pneumonia.  Patient had a choking episode earlier today.  No indication to DC antibiotic treatment.  Patient had a transient period of decreased responsiveness, for which a head CT was ordered.  No acute abnormalities were found.  Patient was able to eat afterwards.  Laboratory evaluations, indicated elevated PCO2 on venous blood gas, arterial blood gas ordered to clarify.  Stable baseline mild renal insufficiency.  No evidence for hospitalization at this time.  CRITICAL CARE-no Performed by: Mancel Bale  Nursing Notes Reviewed/ Care Coordinated Applicable Imaging Reviewed Interpretation of Laboratory Data  incorporated into ED treatment  The patient appears reasonably screened and/or stabilized for discharge and I doubt any other medical condition or other [facility] requiring further screening, evaluation, or treatment in the ED at this time prior to discharge.  Plan: Home Medications-continue current medications; Home Treatments-push oral fluids; return here if the recommended treatment, does not improve the symptoms; Recommended follow up-PCP 1 week.     Final Clinical Impression(s) / ED Diagnoses Final diagnoses:  Choking episode    Rx / DC Orders ED Discharge Orders    None       Mancel Bale, MD 03/13/20 [phone]

## 2020-03-16 ENCOUNTER — Telehealth: Payer: Self-pay | Admitting: *Deleted

## 2020-03-16 NOTE — Telephone Encounter (Signed)
Douglas [name] with [facility] called requesting verbal orders for Speech Therapy 1x6weeks and for Nursing due to a Pressure Ulcer on Sacrum.   Verbal orders given.

## 2020-03-30 ENCOUNTER — Other Ambulatory Visit: Payer: Self-pay | Admitting: Family

## 2020-03-30 DIAGNOSIS — G4701 Insomnia due to medical condition: Secondary | ICD-10-CM

## 2020-03-30 NOTE — Telephone Encounter (Signed)
Patient is requesting a 90 day supply versus 30 day. Will send to Ngetich, Donalee Citrin, NP to review and approve

## 2020-04-01 ENCOUNTER — Ambulatory Visit (INDEPENDENT_AMBULATORY_CARE_PROVIDER_SITE_OTHER): Payer: Medicare Other

## 2020-04-01 DIAGNOSIS — I639 Cerebral infarction, unspecified: Secondary | ICD-10-CM

## 2020-04-01 LAB — CUP PACEART REMOTE DEVICE CHECK
Date Time Interrogation Session: 20211130230556
Implantable Pulse Generator Implant Date: 20201106

## 2020-04-07 NOTE — Progress Notes (Signed)
Carelink Summary Report / Loop Recorder 

## 2020-04-17 ENCOUNTER — Telehealth: Payer: Self-pay

## 2020-04-17 NOTE — Telephone Encounter (Signed)
Douglas [name] would like a PT eval done on [name] as well as Speech therapy for 2 extra visits. 1 x a week x 2 weeks starting on 05/04/2020. Call back number is [phone] if no answer please leave message it is a secured line. Please advise.

## 2020-04-17 NOTE — Telephone Encounter (Signed)
Okay to give PT verbal orders if patient still getting Physical Therapy.If it's a new order will need a face to face order for PT.

## 2020-04-20 ENCOUNTER — Encounter (HOSPITAL_COMMUNITY): Payer: Self-pay | Admitting: *Deleted

## 2020-04-20 ENCOUNTER — Emergency Department (HOSPITAL_COMMUNITY): Payer: Medicare Other

## 2020-04-20 ENCOUNTER — Observation Stay (HOSPITAL_COMMUNITY): Payer: Medicare Other

## 2020-04-20 ENCOUNTER — Inpatient Hospital Stay (HOSPITAL_COMMUNITY)
Admission: EM | Admit: 2020-04-20 | Discharge: 2020-04-27 | DRG: 100 | Disposition: A | Discharge status: Skilled Nursing Facility | Payer: Medicare Other | Attending: Internal Medicine | Admitting: Internal Medicine

## 2020-04-20 ENCOUNTER — Other Ambulatory Visit: Payer: Self-pay

## 2020-04-20 DIAGNOSIS — I69320 Aphasia following cerebral infarction: Secondary | ICD-10-CM

## 2020-04-20 DIAGNOSIS — Z7984 Long term (current) use of oral hypoglycemic drugs: Secondary | ICD-10-CM

## 2020-04-20 DIAGNOSIS — G9340 Encephalopathy, unspecified: Secondary | ICD-10-CM

## 2020-04-20 DIAGNOSIS — Z8673 Personal history of transient ischemic attack (TIA), and cerebral infarction without residual deficits: Secondary | ICD-10-CM

## 2020-04-20 DIAGNOSIS — E785 Hyperlipidemia, unspecified: Secondary | ICD-10-CM | POA: Diagnosis present

## 2020-04-20 DIAGNOSIS — R2990 Unspecified symptoms and signs involving the nervous system: Secondary | ICD-10-CM | POA: Diagnosis not present

## 2020-04-20 DIAGNOSIS — G9341 Metabolic encephalopathy: Secondary | ICD-10-CM | POA: Diagnosis not present

## 2020-04-20 DIAGNOSIS — Z6820 Body mass index (BMI) 20.0-20.9, adult: Secondary | ICD-10-CM

## 2020-04-20 DIAGNOSIS — Z806 Family history of leukemia: Secondary | ICD-10-CM

## 2020-04-20 DIAGNOSIS — Z7982 Long term (current) use of aspirin: Secondary | ICD-10-CM

## 2020-04-20 DIAGNOSIS — N182 Chronic kidney disease, stage 2 (mild): Secondary | ICD-10-CM | POA: Diagnosis present

## 2020-04-20 DIAGNOSIS — Z20822 Contact with and (suspected) exposure to covid-19: Secondary | ICD-10-CM | POA: Diagnosis present

## 2020-04-20 DIAGNOSIS — Z87440 Personal history of urinary (tract) infections: Secondary | ICD-10-CM

## 2020-04-20 DIAGNOSIS — E1142 Type 2 diabetes mellitus with diabetic polyneuropathy: Secondary | ICD-10-CM | POA: Diagnosis present

## 2020-04-20 DIAGNOSIS — Z79899 Other long term (current) drug therapy: Secondary | ICD-10-CM

## 2020-04-20 DIAGNOSIS — E1122 Type 2 diabetes mellitus with diabetic chronic kidney disease: Secondary | ICD-10-CM | POA: Diagnosis present

## 2020-04-20 DIAGNOSIS — Z794 Long term (current) use of insulin: Secondary | ICD-10-CM

## 2020-04-20 DIAGNOSIS — E1159 Type 2 diabetes mellitus with other circulatory complications: Secondary | ICD-10-CM

## 2020-04-20 DIAGNOSIS — L89152 Pressure ulcer of sacral region, stage 2: Secondary | ICD-10-CM | POA: Insufficient documentation

## 2020-04-20 DIAGNOSIS — N400 Enlarged prostate without lower urinary tract symptoms: Secondary | ICD-10-CM | POA: Diagnosis not present

## 2020-04-20 DIAGNOSIS — R4182 Altered mental status, unspecified: Secondary | ICD-10-CM

## 2020-04-20 DIAGNOSIS — R4701 Aphasia: Secondary | ICD-10-CM

## 2020-04-20 DIAGNOSIS — R5381 Other malaise: Secondary | ICD-10-CM

## 2020-04-20 DIAGNOSIS — I69351 Hemiplegia and hemiparesis following cerebral infarction affecting right dominant side: Secondary | ICD-10-CM

## 2020-04-20 DIAGNOSIS — I951 Orthostatic hypotension: Secondary | ICD-10-CM | POA: Diagnosis present

## 2020-04-20 DIAGNOSIS — I129 Hypertensive chronic kidney disease with stage 1 through stage 4 chronic kidney disease, or unspecified chronic kidney disease: Secondary | ICD-10-CM | POA: Diagnosis present

## 2020-04-20 DIAGNOSIS — I152 Hypertension secondary to endocrine disorders: Secondary | ICD-10-CM

## 2020-04-20 DIAGNOSIS — M24531 Contracture, right wrist: Secondary | ICD-10-CM | POA: Diagnosis present

## 2020-04-20 DIAGNOSIS — R32 Unspecified urinary incontinence: Secondary | ICD-10-CM | POA: Diagnosis present

## 2020-04-20 DIAGNOSIS — E43 Unspecified severe protein-calorie malnutrition: Secondary | ICD-10-CM | POA: Insufficient documentation

## 2020-04-20 DIAGNOSIS — K5901 Slow transit constipation: Secondary | ICD-10-CM

## 2020-04-20 DIAGNOSIS — N138 Other obstructive and reflux uropathy: Secondary | ICD-10-CM | POA: Diagnosis present

## 2020-04-20 DIAGNOSIS — Z993 Dependence on wheelchair: Secondary | ICD-10-CM

## 2020-04-20 DIAGNOSIS — N179 Acute kidney failure, unspecified: Secondary | ICD-10-CM | POA: Diagnosis not present

## 2020-04-20 DIAGNOSIS — N401 Enlarged prostate with lower urinary tract symptoms: Secondary | ICD-10-CM | POA: Diagnosis present

## 2020-04-20 DIAGNOSIS — I6990 Unspecified sequelae of unspecified cerebrovascular disease: Secondary | ICD-10-CM

## 2020-04-20 DIAGNOSIS — Z7902 Long term (current) use of antithrombotics/antiplatelets: Secondary | ICD-10-CM

## 2020-04-20 DIAGNOSIS — G40909 Epilepsy, unspecified, not intractable, without status epilepticus: Secondary | ICD-10-CM

## 2020-04-20 LAB — COMPREHENSIVE METABOLIC PANEL
ALT: 32 U/L (ref 0–44)
AST: 18 U/L (ref 15–41)
Albumin: 3.9 g/dL (ref 3.5–5.0)
Alkaline Phosphatase: 92 U/L (ref 38–126)
Anion gap: 11 (ref 5–15)
BUN: 13 mg/dL (ref 8–23)
CO2: 24 mmol/L (ref 22–32)
Calcium: 9.6 mg/dL (ref 8.9–10.3)
Chloride: 103 mmol/L (ref 98–111)
Creatinine, Ser: 0.82 mg/dL (ref 0.61–1.24)
GFR, Estimated: >60 mL/min (ref >60)
Glucose, Bld: 212 mg/dL — ABNORMAL HIGH (ref 70–99)
Potassium: 3.8 mmol/L (ref 3.5–5.1)
Sodium: 138 mmol/L (ref 135–145)
Total Bilirubin: 0.8 mg/dL (ref 0.3–1.2)
Total Protein: 7 g/dL (ref 6.5–8.1)

## 2020-04-20 LAB — URINALYSIS, ROUTINE W REFLEX MICROSCOPIC
Bacteria, UA: NONE SEEN
Bilirubin Urine: NEGATIVE
Glucose, UA: NEGATIVE mg/dL
Ketones, ur: NEGATIVE mg/dL
Leukocytes,Ua: NEGATIVE
Nitrite: NEGATIVE
Protein, ur: 30 mg/dL — AB
Specific Gravity, Urine: 1.018 (ref 1.005–1.030)
pH: 5 (ref 5.0–8.0)

## 2020-04-20 LAB — I-STAT CHEM 8, ED
BUN: 14 mg/dL (ref 8–23)
Calcium, Ion: 1.2 mmol/L (ref 1.15–1.40)
Chloride: 103 mmol/L (ref 98–111)
Creatinine, Ser: 0.7 mg/dL (ref 0.61–1.24)
Glucose, Bld: 213 mg/dL — ABNORMAL HIGH (ref 70–99)
HCT: 42 % (ref 39.0–52.0)
Hemoglobin: 14.3 g/dL (ref 13.0–17.0)
Potassium: 3.7 mmol/L (ref 3.5–5.1)
Sodium: 139 mmol/L (ref 135–145)
TCO2: 25 mmol/L (ref 22–32)

## 2020-04-20 LAB — CBC
HCT: 42 % (ref 39.0–52.0)
Hemoglobin: 13.6 g/dL (ref 13.0–17.0)
MCH: 29.8 pg (ref 26.0–34.0)
MCHC: 32.4 g/dL (ref 30.0–36.0)
MCV: 91.9 fL (ref 80.0–100.0)
Platelets: 259 10*3/uL (ref 150–400)
RBC: 4.57 MIL/uL (ref 4.22–5.81)
RDW: 13.4 % (ref 11.5–15.5)
WBC: 5.5 10*3/uL (ref 4.0–10.5)
nRBC: 0 % (ref 0.0–0.2)

## 2020-04-20 LAB — CBG MONITORING, ED
Glucose-Capillary: 140 mg/dL — ABNORMAL HIGH (ref 70–99)
Glucose-Capillary: 141 mg/dL — ABNORMAL HIGH (ref 70–99)
Glucose-Capillary: 161 mg/dL — ABNORMAL HIGH (ref 70–99)

## 2020-04-20 LAB — DIFFERENTIAL
Abs Immature Granulocytes: 0.01 10*3/uL (ref 0.00–0.07)
Basophils Absolute: 0 10*3/uL (ref 0.0–0.1)
Basophils Relative: 1 %
Eosinophils Absolute: 0.2 10*3/uL (ref 0.0–0.5)
Eosinophils Relative: 4 %
Immature Granulocytes: 0 %
Lymphocytes Relative: 21 %
Lymphs Abs: 1.2 10*3/uL (ref 0.7–4.0)
Monocytes Absolute: 0.4 10*3/uL (ref 0.1–1.0)
Monocytes Relative: 7 %
Neutro Abs: 3.7 10*3/uL (ref 1.7–7.7)
Neutrophils Relative %: 67 %

## 2020-04-20 LAB — RAPID URINE DRUG SCREEN, HOSP PERFORMED
Amphetamines: NOT DETECTED
Barbiturates: NOT DETECTED
Benzodiazepines: NOT DETECTED
Cocaine: NOT DETECTED
Opiates: NOT DETECTED
Tetrahydrocannabinol: NOT DETECTED

## 2020-04-20 LAB — RESP PANEL BY RT-PCR (FLU A&B, COVID) ARPGX2
Influenza A by PCR: NEGATIVE
Influenza B by PCR: NEGATIVE
SARS Coronavirus 2 by RT PCR: NEGATIVE

## 2020-04-20 LAB — PROTIME-INR
INR: 1.1 (ref 0.8–1.2)
Prothrombin Time: 13.4 seconds (ref 11.4–15.2)

## 2020-04-20 LAB — APTT: aPTT: 29 seconds (ref 24–36)

## 2020-04-20 LAB — ETHANOL: Alcohol, Ethyl (B): <10 mg/dL (ref <10)

## 2020-04-20 MED ORDER — TRAZODONE HCL 50 MG PO TABS
25.0000 mg | ORAL_TABLET | Freq: Every evening | ORAL | Status: DC | PRN
  Administered 2020-04-22: 25 mg via ORAL
  Filled 2020-04-20: qty 1

## 2020-04-20 MED ORDER — INDAPAMIDE 1.25 MG PO TABS
1.2500 mg | ORAL_TABLET | Freq: Every day | ORAL | Status: DC
  Administered 2020-04-21 – 2020-04-24 (×4): 1.25 mg via ORAL
  Filled 2020-04-20 (×4): qty 1

## 2020-04-20 MED ORDER — SODIUM CHLORIDE 0.9 % IV SOLN: INTRAVENOUS | Status: DC

## 2020-04-20 MED ORDER — ENOXAPARIN SODIUM 40 MG/0.4ML ~~LOC~~ SOLN
40.0000 mg | Freq: Every day | SUBCUTANEOUS | Status: DC
  Administered 2020-04-20 – 2020-04-27 (×8): 40 mg via SUBCUTANEOUS
  Filled 2020-04-20 (×8): qty 0.4

## 2020-04-20 MED ORDER — CARVEDILOL 12.5 MG PO TABS
12.5000 mg | ORAL_TABLET | Freq: Two times a day (BID) | ORAL | Status: DC
Start: 2020-04-20 — End: 2020-04-27
  Administered 2020-04-21 – 2020-04-27 (×13): 12.5 mg via ORAL
  Filled 2020-04-20 (×13): qty 1

## 2020-04-20 MED ORDER — LISINOPRIL 20 MG PO TABS
20.0000 mg | ORAL_TABLET | Freq: Two times a day (BID) | ORAL | Status: DC
  Administered 2020-04-21 – 2020-04-24 (×6): 20 mg via ORAL
  Filled 2020-04-20 (×8): qty 1

## 2020-04-20 MED ORDER — MIDODRINE HCL 5 MG PO TABS
5.0000 mg | ORAL_TABLET | Freq: Three times a day (TID) | ORAL | Status: DC
  Administered 2020-04-21 – 2020-04-24 (×10): 5 mg via ORAL
  Filled 2020-04-20 (×11): qty 1

## 2020-04-20 MED ORDER — CLOPIDOGREL BISULFATE 75 MG PO TABS
75.0000 mg | ORAL_TABLET | Freq: Every day | ORAL | Status: DC
  Administered 2020-04-21 – 2020-04-27 (×7): 75 mg via ORAL
  Filled 2020-04-20 (×7): qty 1

## 2020-04-20 MED ORDER — LAMOTRIGINE 25 MG PO TABS
150.0000 mg | ORAL_TABLET | Freq: Two times a day (BID) | ORAL | Status: DC
  Administered 2020-04-21 – 2020-04-27 (×13): 150 mg via ORAL
  Filled 2020-04-20: qty 2
  Filled 2020-04-20: qty 1
  Filled 2020-04-20 (×13): qty 2
  Filled 2020-04-20: qty 1

## 2020-04-20 MED ORDER — STROKE: EARLY STAGES OF RECOVERY BOOK
Freq: Once | Status: DC
  Filled 2020-04-20: qty 1

## 2020-04-20 MED ORDER — ACETAMINOPHEN 160 MG/5ML PO SOLN: 650.0000 mg | ORAL | Status: DC | PRN

## 2020-04-20 MED ORDER — ATORVASTATIN CALCIUM 40 MG PO TABS
40.0000 mg | ORAL_TABLET | Freq: Every day | ORAL | Status: DC
  Administered 2020-04-22 – 2020-04-27 (×6): 40 mg via ORAL
  Filled 2020-04-20 (×6): qty 1

## 2020-04-20 MED ORDER — ASPIRIN EC 325 MG PO TBEC
325.0000 mg | DELAYED_RELEASE_TABLET | Freq: Every morning | ORAL | Status: DC
  Administered 2020-04-21 – 2020-04-27 (×7): 325 mg via ORAL
  Filled 2020-04-20 (×7): qty 1

## 2020-04-20 MED ORDER — SODIUM CHLORIDE 0.9 % IV SOLN: Freq: Once | INTRAVENOUS | Status: AC

## 2020-04-20 MED ORDER — TAMSULOSIN HCL 0.4 MG PO CAPS
0.4000 mg | ORAL_CAPSULE | Freq: Every day | ORAL | Status: DC
  Administered 2020-04-21 – 2020-04-26 (×6): 0.4 mg via ORAL
  Filled 2020-04-20 (×6): qty 1

## 2020-04-20 MED ORDER — INSULIN ASPART 100 UNIT/ML ~~LOC~~ SOLN
0.0000 [IU] | Freq: Three times a day (TID) | SUBCUTANEOUS | Status: DC
  Administered 2020-04-20 – 2020-04-21 (×3): 3 [IU] via SUBCUTANEOUS
  Administered 2020-04-22: 5 [IU] via SUBCUTANEOUS
  Administered 2020-04-22: 3 [IU] via SUBCUTANEOUS

## 2020-04-20 MED ORDER — AMLODIPINE BESYLATE 10 MG PO TABS
10.0000 mg | ORAL_TABLET | Freq: Every day | ORAL | Status: DC
  Administered 2020-04-21 – 2020-04-27 (×6): 10 mg via ORAL
  Filled 2020-04-20 (×7): qty 1

## 2020-04-20 MED ORDER — ACETAMINOPHEN 325 MG PO TABS
650.0000 mg | ORAL_TABLET | ORAL | Status: DC | PRN
  Administered 2020-04-22 – 2020-04-23 (×2): 650 mg via ORAL
  Filled 2020-04-20 (×2): qty 2

## 2020-04-20 MED ORDER — ACETAMINOPHEN 650 MG RE SUPP: 650.0000 mg | RECTAL | Status: DC | PRN

## 2020-04-20 NOTE — H&P (Addendum)
History and Physical    [name]. ZHY:[id number] DOB: 1950-12-08 DOA: 04/20/2020  Referring MD/NP/PA: Lynden Oxford, MD PCP: Caesar Bookman, NP  Patient coming from: Home via EMS  Chief Complaint: Altered  I have personally briefly reviewed patient's old medical records in [facility]   HPI: Douglas [name]. is a 69 y.o. male with medical history significant of hypertension, hyperlipidemia, diabetes mellitus type 2, CVA with residual right-sided hemiparesis, and seizure disorder who presented after being found acutely altered this morning.  History obtained per the daughter over the phone patient was last reported to be in his normal state of health around 9-9:30 AM per the patient's wife.  At baseline daughter states that he is alert and able to follow commands.  Vocabulary is limited due to the previous stroke including yes, no, and wow.  He normally gestures to interact.  When she went to check on him around 10:30 AM found him staring at the wall and pressing against it with his left hand.  He was more drowsy than usual and was not following commands.  She checked his blood pressure noted that it was 170/110.  She tried to get him to take his morning medications, but he just held them in his mouth.  She immediately called 911 given his prior history of strokes.  ED Course: Upon admission into the emergency department patient was seen as a code stroke. CT scan of the brain showed no acute abnormalities and old left cerebral infarct.  Vital signs were seen to be stable.  Labs unremarkable except for glucose 213. Neurology was formally consulted recommended EEG and MRI.  Patient reportedly was not back to baseline with concern for possible seizure versus stroke versus infection.  Urinalysis still pending.  TRH called to admit  Review of Systems  Unable to perform ROS: Mental status change    Past Medical History:  Diagnosis Date  . Acute blood loss anemia   .  Acute embolic stroke (HCC)   . Acute lower UTI   . AMS (altered mental status) 06/05/2019  . Cerebral edema (HCC) 04/04/2018  . Diabetes 1.5, managed as type 2 (HCC)   . Diabetes mellitus without complication (HCC)   . High cholesterol   . High glucose    Diabetes  . History of CVA with residual deficit   . History of Dysphagia, post-stroke   . Hypertension   . Ischemic stroke (HCC) 03/05/2019  . Labile blood glucose   . 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 Renal insufficiency 04/04/2018  . Seizures (HCC)   . Stroke (HCC)   . UTI (urinary tract infection) 03/05/2019    Past Surgical History:  Procedure Laterality Date  . IR PATIENT EVAL TECH 0-60 MINS  03/30/2018  . LOOP RECORDER INSERTION N/A 03/08/2019   Procedure: LOOP RECORDER INSERTION;  Surgeon: Regan Lemming, MD;  Location: [facility] INVASIVE CV LAB;  Service: Cardiovascular;  Laterality: N/A;  . TEE WITHOUT CARDIOVERSION N/A 04/02/2018   Procedure: TRANSESOPHAGEAL ECHOCARDIOGRAM (TEE);  Surgeon: [name], MD;  Location[facility] ENDOSCOPY;  Service: Cardiovascular;  Laterality: N/A;     reports that he has never smoked. He has never used smokeless tobacco. He reports previous alcohol use. He reports that he does not use drugs.  Allergies  Allergen Reactions  . Ciprofloxacin Anaphylaxis  . Keppra [Levetiracetam] Other (See Comments)    Makes the patient very agitated and "not like himself"  . Avocado Nausea And  Vomiting  . Metformin And Related Other (See Comments)    "Renal issue"    Family History  Problem Relation Age of Onset  . Leukemia Mother   . Dementia Father     Prior to Admission medications   Medication Sig Start Date End Date Taking? Authorizing Provider  amLODipine (NORVASC) 10 MG tablet Take 1 tablet (10 mg total) by mouth daily. 03/06/20   Ngetich, Dinah C, NP  ascorbic acid (VITAMIN C) 500 MG tablet Take 1 tablet (500 mg total) by mouth daily as needed (if cold-like symptoms are  present). 08/30/19   Margit Hanks, MD  aspirin EC 325 MG tablet Take 325 mg by mouth every morning.    [provider]  atorvastatin (LIPITOR) 40 MG tablet Take 1 tablet (40 mg total) by mouth daily at 6 (six) AM. 12/26/19   Ngetich, Dinah C, NP  baclofen (LIORESAL) 20 MG tablet Take 1 tablet (20 mg total) by mouth 2 (two) times daily. 03/06/20   Ngetich, Dinah C, NP  bisacodyl (DULCOLAX) 10 MG suppository Place 1 suppository (10 mg total) rectally as needed for moderate constipation. 12/26/19   Ngetich, Dinah C, NP  carvedilol (COREG) 12.5 MG tablet Take 1 tablet (12.5 mg total) by mouth 2 (two) times daily with a meal. 12/27/19   Ngetich, Dinah C, NP  clopidogrel (PLAVIX) 75 MG tablet Take 1 tablet (75 mg total) by mouth daily. 12/26/19   Ngetich, Dinah C, NP  glipiZIDE (GLUCOTROL) 10 MG tablet Take 1 tablet (10 mg total) by mouth 2 (two) times daily with a meal. 12/26/19   Ngetich, Dinah C, NP  indapamide (LOZOL) 1.25 MG tablet Take 1 tablet (1.25 mg total) by mouth daily. 12/27/19   Ngetich, Dinah C, NP  insulin aspart (NOVOLOG) 100 UNIT/ML injection Inject 2-15 Units into the skin 4 (four) times daily -  before meals and at bedtime. SSI:  121-150 = 2 units; 151-200 = 3 units, 201-250 = 5 units, 251-300 = 8 units, 301-350 = 11 units, 351-300 = 15 units, greater than 400 = 15 units and call MD 12/26/19   Ngetich, Dinah C, NP  insulin detemir (LEVEMIR) 100 UNIT/ML injection Inject 0.16 mLs (16 Units total) into the skin daily. If CBG >125 (per daughter) 12/26/19   Ngetich, Donalee Citrin, NP  lamoTRIgine (LAMICTAL) 150 MG tablet Take 1 tablet (150 mg total) by mouth 2 (two) times daily. 03/06/20   Ngetich, Dinah C, NP  lisinopril (ZESTRIL) 20 MG tablet Take 1 tablet (20 mg total) by mouth 2 (two) times daily. 12/26/19   Ngetich, Dinah C, NP  liver oil-zinc oxide (DESITIN) 40 % ointment Apply 1 application topically 2 (two) times daily as needed for irritation. 12/26/19   Ngetich, Dinah C, NP  midodrine  (PROAMATINE) 5 MG tablet Take 1 tablet (5 mg total) by mouth in the morning, at noon, and at bedtime. FOR ORTHOSTATIC HYPOTENSION 03/06/20   Ngetich, Dinah C, NP  Omega-3 Fatty Acids (OMEGA-3 FISH OIL PO) Take by mouth.    [provider]  pantoprazole (PROTONIX) 40 MG tablet Take 1 tablet (40 mg total) by mouth daily. 12/26/19   Ngetich, Dinah C, NP  senna-docusate (SENOKOT-S) 8.6-50 MG tablet Take 1 tablet by mouth daily. 12/26/19   Ngetich, Dinah C, NP  tamsulosin (FLOMAX) 0.4 MG CAPS capsule Take 1 capsule (0.4 mg total) by mouth daily after supper. 12/27/19   Ngetich, Dinah C, NP  traZODone (DESYREL) 50 MG tablet TAKE 1/2-1 TABLET BY  MOUTH AT BEDTIME AS NEEDED FOR SLEEP. 03/30/20   Ngetich, Dinah C, NP  Vitamin D3 (VITAMIN D) 25 MCG tablet Take 0.5 tablets (500 Units total) by mouth daily. 08/30/19   Margit Hanks, MD    Physical Exam:  Constitutional: Elderly male currently not following commands. Vitals:   04/20/20 1151 04/20/20 1201 04/20/20 1222 04/20/20 1223  BP: 132/76  132/76   Pulse: 76  76   Resp: 16  14   Temp:  (!) 97.4 F (36.3 C) 97.6 F (36.4 C)   TempSrc:  Rectal Rectal   SpO2:   100%   Weight:    72 kg  Height:    6\' 1"  (1.854 m)   Eyes: PERRL, lids and conjunctivae normal ENMT: Mucous membranes are dry. Posterior pharynx clear of any exudate or lesions.  Neck: normal, supple, no masses, no thyromegaly Respiratory: clear to auscultation bilaterally, no wheezing, no crackles. Normal respiratory effort. No accessory muscle use.  Cardiovascular: Regular rate and rhythm, no murmurs / rubs / gallops. No extremity edema. 2+ pedal pulses. No carotid bruits.  Abdomen: no tenderness, no masses palpated. No hepatosplenomegaly. Bowel sounds positive.  Musculoskeletal: no clubbing / cyanosis.  Right-sided hemiparesis with contractures appreciated Skin: no rashes, lesions, ulcers. No induration Neurologic: CN 2-12 grossly intact.  Right-sided hemiparesis.  Currently  opening and closing left hand as though he is pointing at something Psychiatric: Alert, but unable to assess for orientation at this time    Labs on Admission: I have personally reviewed following labs and imaging studies  CBC: Recent Labs  Lab 04/20/20 1130 04/20/20 1135  WBC 5.5  --   NEUTROABS 3.7  --   HGB 13.6 14.3  HCT 42.0 42.0  MCV 91.9  --   PLT 259  --    Basic Metabolic Panel: Recent Labs  Lab 04/20/20 1130 04/20/20 1135  NA 138 139  K 3.8 3.7  CL 103 103  CO2 24  --   GLUCOSE 212* 213*  BUN 13 14  CREATININE 0.82 0.70  CALCIUM 9.6  --    GFR: Estimated Creatinine Clearance: 88.8 mL/min (by C-G formula based on SCr of 0.7 mg/dL). Liver Function Tests: Recent Labs  Lab 04/20/20 1130  AST 18  ALT 32  ALKPHOS 92  BILITOT 0.8  PROT 7.0  ALBUMIN 3.9   No results for input(s): LIPASE, AMYLASE in the last 168 hours. No results for input(s): AMMONIA in the last 168 hours. Coagulation Profile: Recent Labs  Lab 04/20/20 1130  INR 1.1   Cardiac Enzymes: No results for input(s): CKTOTAL, CKMB, CKMBINDEX, TROPONINI in the last 168 hours. BNP (last 3 results) No results for input(s): PROBNP in the last 8760 hours. HbA1C: No results for input(s): HGBA1C in the last 72 hours. CBG: No results for input(s): GLUCAP in the last 168 hours. Lipid Profile: No results for input(s): CHOL, HDL, LDLCALC, TRIG, CHOLHDL, LDLDIRECT in the last 72 hours. Thyroid Function Tests: No results for input(s): TSH, T4TOTAL, FREET4, T3FREE, THYROIDAB in the last 72 hours. Anemia Panel: No results for input(s): VITAMINB12, FOLATE, FERRITIN, TIBC, IRON, RETICCTPCT in the last 72 hours. Urine analysis:    Component Value Date/Time   COLORURINE YELLOW 02/23/2020 1543   APPEARANCEUR HAZY (A) 02/23/2020 1543   LABSPEC 1.019 02/23/2020 1543   PHURINE 5.0 02/23/2020 1543   GLUCOSEU NEGATIVE 02/23/2020 1543   HGBUR NEGATIVE 02/23/2020 1543   BILIRUBINUR NEGATIVE 02/23/2020 1543    KETONESUR NEGATIVE 02/23/2020 1543  PROTEINUR NEGATIVE 02/23/2020 1543   NITRITE NEGATIVE 02/23/2020 1543   LEUKOCYTESUR TRACE (A) 02/23/2020 1543   Sepsis Labs: No results found for this or any previous visit (from the past 240 hour(s)).   Radiological Exams on Admission: CT HEAD CODE STROKE WO CONTRAST  Result Date: 04/20/2020 CLINICAL DATA:  Code stroke.  Aphasia with left-sided weakness EXAM: CT HEAD WITHOUT CONTRAST TECHNIQUE: Contiguous axial images were obtained from the base of the skull through the vertex without intravenous contrast. COMPARISON:  02/23/2020 FINDINGS: Brain: Large remote left MCA and PCA territory infarct. No evidence of hemorrhage or acute infarct. Confluent low-density in the cerebral white matter attributed to chronic small vessel ischemia. Vascular: Atheromatous calcification. Prominent density at the right MCA bifurcation on axial slices but not clearly hyperdense on the reformats. Skull: No acute finding. Sinuses/Orbits: Negative Other: These results were communicated to Dr Napoleon FormAbsher at 11:45 amon 12/20/2021by text page via the [facility] messaging system. ASPECTS [facility]([place] Stroke Program Early CT Score) - Ganglionic level infarction (caudate, lentiform nuclei, internal capsule, insula, M1-M3 cortex): 7 - Supraganglionic infarction (M4-M6 cortex): 3 Total score (0-10 with 10 being normal): 10 IMPRESSION: 1. No acute finding. 2. Remote left cerebral infarction that only spares the ACA distribution. Electronically Signed   By: Marnee SpringJonathon  Watts M.D.   On: 04/20/2020 11:47    EKG: Independently reviewed.  Sinus rhythm at 69 bpm with background  Assessment/Plan Acute metabolic encephalopathy: Patient presented after being found to be acutely altered this morning unable to follow commands like normal.  Last known normal sometime around 9 or 9:30 AM.  Initial CT scan of the brain showed no acute acute abnormalities.  Neurology was formally consulted and question possibility of  seizure versus stroke versus infection as cause of symptoms. -Admit to medical telemetry bed -Neurochecks -Follow-up urinalysis as patient has prior history of UTIs -Check MRI -Check EEG -Orders placed for loop recorder to be interrogated and for results to be placed in chart -Appreciate neurology consultative services, follow-up for further recommendation  Aphasia: Chronic.  At baseline patient reportedly able to say yes/no and normally gestures to communicate.   -Speech therapy consult for diet recommended  Essential hypertension: At home patient blood pressures were reported to be elevated up to 170/100.  Daughter had attempted to give patient his morning medications for blood pressure, but reportedly he just held them in his mouth.  Blood pressure stable at 132/76 currently.  Home medications include amlodipine 10 mg daily, Coreg's 12.5 mg twice daily,indapamide 1.25 mg daily, lisinopril 20 mg daily, and midodrine 5 mg TID for orthostatic hypotension. -Consider resuming home blood pressure medications tomorrow morning unless patient found to have acute stroke  Diabetes mellitus type 2, controlled: Last hemoglobin A1c noted to be 6.5 on 03/06/2020.  Initial blood glucose mildly elevated at 212. Home medications include glipizide 10 mg twice daily, sliding scale insulin, and Levemir 16 units daily. -Hypoglycemic protocol -Hold glipizide and Levemir -CBGs before every meal with moderate SSI -Adjust insulin regimen as  History of CVA with residual deficit: Patient has right-sided hemiparesis and aphasia due to a prior left cerebral stroke. -Continue aspirin, Plavix, statin  History of seizure disorder: Home medications include lamotrigine 150 mg twice daily. -Follow-up EEG -Continue lamotrigine  Hyperlipidemia: Home medications include atorvastatin 40 mg nightly. -Continue statin   BPH  -Continue tamsulosin  DVT prophylaxis: lovenox Code Status: Full Family Communication: Daughter  updated over the phone Disposition Plan: To be determined Consults called: Neurology Admission status: observation, currently  suspected less than 2 midnight stay  Clydie Braun MD [facility]   If 7PM-7AM, please contact night-coverage   04/20/2020, 12:37 PM

## 2020-04-20 NOTE — Code Documentation (Signed)
Stroke Response Nurse Documentation Code Documentation  Jerone Cudmore. is a 69 y.o. male arriving to [facility] ED via [facility] on 12/20 with past medical hx of stroke. Code stroke was activated by EMS. Patient from home where he was LKW at 0930 and now complaining of altered mental status. Pt was seen by wife at 0930 drinking water and acting his normal. At 1030, daughter walked into the room and noted that patient was acting altered. Hodling onto the wall and not swallowing pills. Took BP and noted to be 176/101. Daughter called EMS. On clopidogrel 75 mg daily and ASA.   Stroke team at the bedside on patient arrival. Labs drawn and patient cleared for CT by Dr. Rush Landmark. Patient to CT with team. NIHSS 20, see documentation for details and code stroke times. Patient with disoriented, not following commands, left gaze preference , right hemianopia, right facial droop, right arm weakness, right leg weakness, right decreased sensation, Global aphasia  and dysarthria  on exam. Patient's baseline is right arm and leg weakness, aphasia. The following imaging was completed:  CT. Patient is not a candidate for tPA due to large previous stroke noted. Care/Plan: q2 mNIHSS/VS and EEG. Bedside handoff with ED RN Tresa Endo.    Lucila Maine  Stroke Response RN

## 2020-04-20 NOTE — ED Notes (Signed)
Patient keeps grunting and reaching out with left hand , unable to determine what he wants.

## 2020-04-20 NOTE — Progress Notes (Signed)
EEG complete - results pending 

## 2020-04-20 NOTE — ED Notes (Signed)
Transported to MRI

## 2020-04-20 NOTE — Consult Note (Signed)
Neurology Consultation  CC: Code stroke: aphasia, drowsy  History is obtained from: patient daughter/caregiver, chart review  HPI: Douglas [name]. is a 69 y.o. male with a history significant for multiple CVAs with right-sided deficits, type 2 diabetes, HLD, HTN, seizures on lamotrigine (last seizure August 2021 with increase home dose of lamotrigine), and renal insufficiency who presented to [facility] ED 12/20 as a Code Stroke. Per EMS, patient was found by his daughter at 10:30 this morning pushing into the wall, more drowsy than usual, and with an elevated blood pressure prompting her to call EMS. Per daughter, patient's wife interacted with the patient this morning at 09:30 and stated he was at his baseline mental status. On EMS arrival, a code stroke was activated with concerns for aphasia and left-sided weakness.   Regarding patient's baseline: He has right-sided deficits with no movement of the right extremities (contracture of right upper extremity), aphasia "yes / no" words only, uses gestures to communicate, wheel-chair bound with assistance to stand for restroom, and he is incontinent of urine.  LKW: 12/20 10:30 tpa given?: No, concern for seizure etiology of symptoms, not new ischemia. CTH negative for acute findings.  IR Thrombectomy? No, no LVO Modified Rankin Scale: 5-Severe disability-bedridden, incontinent, needs constant attention  NIHSS:  NIHSS components Score: Comment  1a Level of Conscious 0_0  1_1  2_2  3_3         1b LOC Questions 0_4  1_5  2_6           1c LOC Commands 0_7  1_8  2_9           2 Best Gaze 0_10  1_11  2_12        Prefers left gaze; will cross midline  3 Visual 0_13  1_14  2_15  3_16         4 Facial Palsy 0_17  1_18  2_19  3_20       Right mouth droop resting  5a Motor Arm - left 0_21  1_22  2_23  3_24  4_25  UN_26     5b Motor Arm - Right 0_27  1_28  2_29  3_30  4_31  UN_32  Baseline right sided deficits previous CVA   6a Motor Leg - Left 0_33  1_34  2_35  3_36  4_37  UN_38     6b Motor Leg - Right 0_39  1_40   2_41  3_42  4_43  UN_44  Baseline right sided deficits previous CVA   7 Limb Ataxia 0_45  1_46  2_47  3_48  UN_49    Does not follow commands  8 Sensory 0_50  1_51  2_52  UN_53      Aphasia; unable to communicate sensory findings  9 Best Language 0_54  1_55  2_56  3_57      Baseline for patient with global aphasia only uses "yes"  10 Dysarthria 0_58  1_59  2_60  UN_61      Clearly repeats "yes" as only form of communication without slurring.  11 Extinct. and Inattention 0_62  1_63  2_64           TOTAL: 18      ROS: Unable to obtain due to severe aphasia at baseline mental status.   Past Medical History:  Diagnosis Date  . Acute blood loss anemia   . Acute embolic stroke ([place])   . Acute lower UTI   . AMS (altered mental status) 06/05/2019  . Cerebral edema ([place]) 04/04/2018  . Diabetes 1.5, managed as type 2 ([place])   . Diabetes mellitus without complication ([place])   . High cholesterol   . High glucose    Diabetes  . History of CVA with residual deficit   . History of Dysphagia, post-stroke   . Hypertension   . Ischemic stroke ([place]) 03/05/2019  . Labile blood glucose   .  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 Renal insufficiency 04/04/2018  . Seizures ([place])   . Stroke ([place])   . UTI (urinary tract infection) 03/05/2019     Family History  Problem Relation Age of Onset  . Leukemia Mother   . Dementia Father    Social History:  reports that he has never smoked. He has never used smokeless tobacco. He reports previous alcohol use. He reports that he does not use drugs.  Current Scheduled Medications: Current Outpatient Medications  Medication Instructions  . amLODipine (NORVASC) 10 mg, Oral, Daily  . ascorbic acid (VITAMIN C) 500 mg, Oral, Daily PRN  . aspirin EC 325 mg, Oral,  Every morning - 10a  . atorvastatin (LIPITOR) 40 mg, Oral, Daily  . baclofen (LIORESAL) 20 mg, Oral, 2 times daily  . bisacodyl (DULCOLAX) 10 mg, Rectal, As needed  . carvedilol (COREG) 12.5 mg, Oral, 2 times daily with meals  .  clopidogrel (PLAVIX) 75 mg, Oral, Daily  . glipiZIDE (GLUCOTROL) 10 mg, Oral, 2 times daily with meals  . indapamide (LOZOL) 1.25 mg, Oral, Daily  . insulin aspart (NOVOLOG) 2-15 Units, Subcutaneous, 3 times daily before meals & bedtime, SSI:  121-150 = 2 units; 151-200 = 3 units, 201-250 = 5 units, 251-300 = 8 units, 301-350 = 11 units, 351-300 = 15 units, greater than 400 = 15 units and call MD   . insulin detemir (LEVEMIR) 16 Units, Subcutaneous, Daily, If CBG >125 (per daughter)   . lamoTRIgine (LAMICTAL) 150 mg, Oral, 2 times daily  . lisinopril (ZESTRIL) 20 mg, Oral, 2 times daily  . liver oil-zinc oxide (DESITIN) 40 % ointment 1 application, Topical, 2 times daily PRN  . midodrine (PROAMATINE) 5 mg, Oral, 3 times daily, FOR ORTHOSTATIC HYPOTENSION   . Omega-3 Fatty Acids (OMEGA-3 FISH OIL PO) Oral  . pantoprazole (PROTONIX) 40 mg, Oral, Daily  . senna-docusate (SENOKOT-S) 8.6-50 MG tablet 1 tablet, Oral, Daily  . tamsulosin (FLOMAX) 0.4 mg, Oral, Daily after supper  . traZODone (DESYREL) 50 MG tablet TAKE 1/2-1 TABLET BY MOUTH AT BEDTIME AS NEEDED FOR SLEEP.  . Vitamin D3 (VITAMIN D) 500 Units, Oral, Daily   Assessment: Exam: Current vital signs: BP 132/76   Pulse 76   Temp (!) 97.4 F (36.3 C) (Rectal)   Resp 16   Wt 74.6 kg   BMI 21.12 kg/m    Physical Exam  Constitutional: Awake, in bed, no acute distress, thin. Does not follow commands. Limited interaction with examiner.  Eyes: No scleral injection HENT: No OP obstruction, Normocephalic/atraumatic.   Cardiovascular: Normal rate on cardiac monitor, extremities cool without edema. Respiratory: Effort normal, non-labored breathing.  GI: Soft.  No distension. Skin: WDI  Neuro: Mental Status: Patient is awake, globally aphasic (baseline) Patient is unable to give a clear and coherent history. Patient is globally aphasic from previous large left ischemic stroke.  Cranial Nerves: II: Visual Fields: no blink to threat  in the right eye, blink to threat present on left eye.  III,IV, VI: EOMI. Gaze preference to the left, will cross midline and look towards the right inconsistently.  V: Facial sensation- unable to be assessed due to baseline severe aphasia. Patient does not follow commands.  VII: Facial movement is asymmetric with right sided mouth droop at rest. VIII: Unable to assess hearing X: Unable to assess not following commands XI: Shoulder shrug is unable to be assessed; not following commands. XII: Tongue movement unable to be assessed due to patient  not following commands.  Motor: Tone is normal on the left extremities upper and lower. Contracture to the right upper extremity. 5/5 strength was present in left upper and left lower extremity. There is no movement in the right upper and lower extremities (baseline from previous CVA) Sensory: Sensation is unable to be assessed due to severe global aphasia. Will respond to stimuli on the left side, RLE flaccid, RUE contracted. Deep Tendon Reflexes: 2+ and symmetric in the biceps, 1+ patellae. Plantars: Toes are downgoing bilaterally. Cerebellar: FNF and HKS are unable to be assessed; patient does not follow commands.  I have reviewed labs in epic and the pertinent results are: Glucose 213, GFR 47  I have reviewed the images obtained: CTH: IMPRESSION: 1. No acute finding. 2. Remote left cerebral infarction that only spares the ACA distribution.  Primary Diagnosis:  Cerebral infarction, unspecified. Most likely his current findings relate to his old left MCA and PCA damage, producing aphasia and spastic right hemiparesis. The worsening of his symptoms seems most likely to be due to seizures, given the lethargy and rapid improvement during transport by EMS.  Secondary Diagnosis: Seizure disorder, likely secondary to stroke.   Impression:  Mr. Goggins is a 69 year old man with a history significant for multiple CVAs with right-sided deficits,  type 2 diabetes, HLD, HTN, seizures on lamotrigine (last seizure August 2021 with increase home dose of lamotrigine), and renal insufficiency. He presented [facility] ED as a code stroke for evaluation of AMS (drowsy, aphasia, left-sided weakness). CTH was obtained without evidence of acute process. Per patient daughter, Mr. Ferns is known to have seizures and had his last seizure in August with resulting increase in his home dose of lamotrigine. With symptoms of drowsiness resolved on arrival and patient at reported baseline mental status, expect seizure likely responsible for presentation. Will obtain EEG and adjust AED if necessary. Will obtain MRI to further rule out acute ischemic process.   Recommendations: - MRI of the brain without contrast - Frequent neuro checks q2h - Prophylactic therapy  - Risk factor modification - Telemetry monitoring - PT consult, OT consult, Speech consult - Stroke team to follow   Assessment and plan discussed with attending provider and they are in agreement. Anibal Henderson, AGAC-NP [facility] [phone]  Attestation:  I have seen Mr. See for the code stroke assessment. I independently examined him and attempted to obtain the history (unable). I supervised Ms. Toberman and directed the diagnostic evaluation and plans. Mr. Berthold was unable to provide any history. 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 Able to lift left arm and leg without drift. Does not follow commands, except via gestural cues. Gaze focused to the left, but can cross midline to the right. NIHSS = 18.  Stroke page = 11:14am Met patient in ED at 11:24am tPA decision 11:25am CT obtained: 11:34am CTA/CTP not obtained, as they would not change management (not eligible for tPA as he was rapidly improving and deficits deemed not to be substantially different from baseline; not eligible for MER due to large area of encephalomalacia in left hemisphere, rapid  improvement in status suggesting seizure etiology, and poor baseline functional status with mRS = 5).  Perfecto Kingdom, MD

## 2020-04-20 NOTE — ED Provider Notes (Signed)
[facility] EMERGENCY DEPARTMENT Provider Note   CSN: [id number] Arrival date & time: 04/20/20  1124     History Chief Complaint  Patient presents with  . Code Stroke    Douglas [name]. is a 69 y.o. male.  The history is provided by the EMS personnel, a relative and medical records. The history is limited by the condition of the patient. No language interpreter was used.  Cerebrovascular Accident This is a recurrent problem. The current episode started 6 to 12 hours ago. The problem occurs constantly. The problem has not changed since onset.Pertinent negatives include no chest pain, no abdominal pain and no shortness of breath.       Past Medical History:  Diagnosis Date  . Acute blood loss anemia   . Acute embolic stroke (HCC)   . Acute lower UTI   . AMS (altered mental status) 06/05/2019  . Cerebral edema (HCC) 04/04/2018  . Diabetes 1.5, managed as type 2 (HCC)   . Diabetes mellitus without complication (HCC)   . High cholesterol   . High glucose    Diabetes  . History of CVA with residual deficit   . History of Dysphagia, post-stroke   . Hypertension   . Ischemic stroke (HCC) 03/05/2019  . Labile blood glucose   . 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 Renal insufficiency 04/04/2018  . Seizures (HCC)   . Stroke (HCC)   . UTI (urinary tract infection) 03/05/2019    Patient Active Problem List   Diagnosis Date Noted  . Slow transit constipation 12/26/2019  . Seizure (HCC) 11/20/2019  . Seizures (HCC) 11/20/2019  . Encephalopathy 11/19/2019  . Delayed orthostatic hypotension 08/04/2019  . Seizure disorder (HCC) 07/21/2019  . 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 Shoulder subluxation, right, sequela 07/16/2019  . Altered mental status 07/15/2019  . 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 Late effects of CVA (cerebrovascular accident)   . Pressure injury of skin 06/08/2019  . History of  loop recorder 03/08/2019  . UTI (urinary tract infection) 03/05/2019  . 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 Global aphasia   . Type 2 diabetes mellitus with peripheral neuropathy (HCC)   . Hypertension associated with diabetes (HCC)   . Hyperlipidemia associated with type 2 diabetes mellitus (HCC)   . History of Dysphagia, post-stroke     Past Surgical History:  Procedure Laterality Date  . IR PATIENT EVAL TECH 0-60 MINS  03/30/2018  . LOOP RECORDER INSERTION N/A 03/08/2019   Procedure: LOOP RECORDER INSERTION;  Surgeon: Regan Lemming, MD;  Location: [facility] INVASIVE CV LAB;  Service: Cardiovascular;  Laterality: N/A;  . TEE WITHOUT CARDIOVERSION N/A 04/02/2018   Procedure: TRANSESOPHAGEAL ECHOCARDIOGRAM (TEE);  Surgeon: [name], MD;  Location: [facility] ENDOSCOPY;  Service: Cardiovascular;  Laterality: N/A;       Family History  Problem Relation Age of Onset  . Leukemia Mother   . Dementia Father     Social History   Tobacco Use  . Smoking status: Never Smoker  . Smokeless tobacco: Never Used  Vaping Use  . Vaping Use: Never used  Substance Use Topics  . Alcohol use: Not Currently  . Drug use: Never    Home Medications Prior to Admission medications   Medication Sig Start Date End Date Taking? Authorizing Provider  amLODipine (NORVASC) 10 MG tablet Take 1 tablet (10 mg total) by mouth  daily. 03/06/20   Ngetich, Dinah C, NP  ascorbic acid (VITAMIN C) 500 MG tablet Take 1 tablet (500 mg total) by mouth daily as needed (if cold-like symptoms are present). 08/30/19   Margit Hanks, MD  aspirin EC 325 MG tablet Take 325 mg by mouth every morning.    [provider]  atorvastatin (LIPITOR) 40 MG tablet Take 1 tablet (40 mg total) by mouth daily at 6 (six) AM. 12/26/19   Ngetich, Dinah C, NP  baclofen (LIORESAL) 20 MG tablet Take 1 tablet (20 mg total) by mouth 2 (two) times daily. 03/06/20   Ngetich, Dinah C,  NP  bisacodyl (DULCOLAX) 10 MG suppository Place 1 suppository (10 mg total) rectally as needed for moderate constipation. 12/26/19   Ngetich, Dinah C, NP  carvedilol (COREG) 12.5 MG tablet Take 1 tablet (12.5 mg total) by mouth 2 (two) times daily with a meal. 12/27/19   Ngetich, Dinah C, NP  clopidogrel (PLAVIX) 75 MG tablet Take 1 tablet (75 mg total) by mouth daily. 12/26/19   Ngetich, Dinah C, NP  glipiZIDE (GLUCOTROL) 10 MG tablet Take 1 tablet (10 mg total) by mouth 2 (two) times daily with a meal. 12/26/19   Ngetich, Dinah C, NP  indapamide (LOZOL) 1.25 MG tablet Take 1 tablet (1.25 mg total) by mouth daily. 12/27/19   Ngetich, Dinah C, NP  insulin aspart (NOVOLOG) 100 UNIT/ML injection Inject 2-15 Units into the skin 4 (four) times daily -  before meals and at bedtime. SSI:  121-150 = 2 units; 151-200 = 3 units, 201-250 = 5 units, 251-300 = 8 units, 301-350 = 11 units, 351-300 = 15 units, greater than 400 = 15 units and call MD 12/26/19   Ngetich, Dinah C, NP  insulin detemir (LEVEMIR) 100 UNIT/ML injection Inject 0.16 mLs (16 Units total) into the skin daily. If CBG >125 (per daughter) 12/26/19   Ngetich, Donalee Citrin, NP  lamoTRIgine (LAMICTAL) 150 MG tablet Take 1 tablet (150 mg total) by mouth 2 (two) times daily. 03/06/20   Ngetich, Dinah C, NP  lisinopril (ZESTRIL) 20 MG tablet Take 1 tablet (20 mg total) by mouth 2 (two) times daily. 12/26/19   Ngetich, Dinah C, NP  liver oil-zinc oxide (DESITIN) 40 % ointment Apply 1 application topically 2 (two) times daily as needed for irritation. 12/26/19   Ngetich, Dinah C, NP  midodrine (PROAMATINE) 5 MG tablet Take 1 tablet (5 mg total) by mouth in the morning, at noon, and at bedtime. FOR ORTHOSTATIC HYPOTENSION 03/06/20   Ngetich, Dinah C, NP  Omega-3 Fatty Acids (OMEGA-3 FISH OIL PO) Take by mouth.    [provider]  pantoprazole (PROTONIX) 40 MG tablet Take 1 tablet (40 mg total) by mouth daily. 12/26/19   Ngetich, Dinah C, NP  senna-docusate  (SENOKOT-S) 8.6-50 MG tablet Take 1 tablet by mouth daily. 12/26/19   Ngetich, Dinah C, NP  tamsulosin (FLOMAX) 0.4 MG CAPS capsule Take 1 capsule (0.4 mg total) by mouth daily after supper. 12/27/19   Ngetich, Dinah C, NP  traZODone (DESYREL) 50 MG tablet TAKE 1/2-1 TABLET BY MOUTH AT BEDTIME AS NEEDED FOR SLEEP. 03/30/20   Ngetich, Dinah C, NP  Vitamin D3 (VITAMIN D) 25 MCG tablet Take 0.5 tablets (500 Units total) by mouth daily. 08/30/19   Margit Hanks, MD    Allergies    Ciprofloxacin, Keppra [levetiracetam], Avocado, and Metformin and related  Review of Systems   Review of Systems  Unable to perform ROS:  Patient nonverbal  Constitutional: Negative for chills and fever.  HENT: Negative for congestion.   Respiratory: Negative for cough and shortness of breath.   Cardiovascular: Negative for chest pain.  Gastrointestinal: Negative for abdominal pain, constipation, diarrhea, nausea and vomiting.  Neurological: Positive for facial asymmetry and weakness (at baseline).  Psychiatric/Behavioral: Negative for agitation.    Physical Exam Updated Vital Signs BP 132/76 (BP Location: Left Arm)   Pulse 76   Temp 97.6 F (36.4 C) (Rectal)   Resp 14   Ht  (1.854 m)   Wt 72 kg   SpO2 100%   BMI 20.95 kg/m   Physical Exam Vitals and nursing note reviewed.  Constitutional:      General: He is not in acute distress.    Appearance: He is well-developed and well-nourished. He is not ill-appearing, toxic-appearing or diaphoretic.  HENT:     Head: Normocephalic and atraumatic.     Nose: No congestion.     Mouth/Throat:     Mouth: Mucous membranes are dry.     Pharynx: No oropharyngeal exudate or posterior oropharyngeal erythema.  Eyes:     Conjunctiva/sclera: Conjunctivae normal.     Pupils: Pupils are equal, round, and reactive to light.  Cardiovascular:     Rate and Rhythm: Normal rate and regular rhythm.     Heart sounds: No murmur heard.   Pulmonary:     Effort:  Pulmonary effort is normal. No respiratory distress.     Breath sounds: Normal breath sounds. No wheezing, rhonchi or rales.  Chest:     Chest wall: No tenderness.  Abdominal:     General: Abdomen is flat.     Palpations: Abdomen is soft.     Tenderness: There is no abdominal tenderness. There is no right CVA tenderness, left CVA tenderness or rebound.  Musculoskeletal:        General: No tenderness or edema.     Cervical back: Neck supple. No tenderness.  Skin:    General: Skin is warm and dry.  Neurological:     Mental Status: He is alert.     Cranial Nerves: Cranial nerve deficit present.     Motor: Weakness present.     Comments: Patient has weakness in right face, right arm, right leg which per family is at his baseline.  He is able to move his left arm and left leg and can answer some questions with yes response.  This is worse than normal where he is more interactive and can answer other answers at times.  Exam otherwise unremarkable.  No evidence of acute trauma seen.  He is more sleepy and somnolent at his baseline but he still alert to voice and stimuli  Psychiatric:        Mood and Affect: Mood and affect normal.     ED Results / Procedures / Treatments   Labs (all labs ordered are listed, but only abnormal results are displayed) Labs Reviewed  COMPREHENSIVE METABOLIC PANEL - Abnormal; Notable for the following components:      Result Value   Glucose, Bld 212 (*)    All other components within normal limits  I-STAT CHEM 8, ED - Abnormal; Notable for the following components:   Glucose, Bld 213 (*)    All other components within normal limits  RESP PANEL BY RT-PCR (FLU A&B, COVID) ARPGX2  ETHANOL  PROTIME-INR  APTT  CBC  DIFFERENTIAL  RAPID URINE DRUG SCREEN, HOSP PERFORMED  URINALYSIS, ROUTINE W REFLEX MICROSCOPIC  EKG EKG Interpretation  Date/Time:  Monday April 20 2020 13:15:45 EST Ventricular Rate:  69 PR Interval:    QRS Duration: 106 QT  Interval:  415 QTC Calculation: 445 R Axis:   73 Text Interpretation: Sinus rhythm Consider left atrial enlargement Probable anteroseptal infarct, old When compared to prior, more artifact. No STEMI Confirmed by Theda Belfastegeler, Chris ([id number]) on 04/20/2020 2:36:36 PM   Radiology CT HEAD CODE STROKE WO CONTRAST  Result Date: 04/20/2020 CLINICAL DATA:  Code stroke.  Aphasia with left-sided weakness EXAM: CT HEAD WITHOUT CONTRAST TECHNIQUE: Contiguous axial images were obtained from the base of the skull through the vertex without intravenous contrast. COMPARISON:  02/23/2020 FINDINGS: Brain: Large remote left MCA and PCA territory infarct. No evidence of hemorrhage or acute infarct. Confluent low-density in the cerebral white matter attributed to chronic small vessel ischemia. Vascular: Atheromatous calcification. Prominent density at the right MCA bifurcation on axial slices but not clearly hyperdense on the reformats. Skull: No acute finding. Sinuses/Orbits: Negative Other: These results were communicated to Dr Napoleon FormAbsher at 11:45 amon 12/20/2021by text page via the [facility] messaging system. ASPECTS [facility]([place] Stroke Program Early CT Score) - Ganglionic level infarction (caudate, lentiform nuclei, internal capsule, insula, M1-M3 cortex): 7 - Supraganglionic infarction (M4-M6 cortex): 3 Total score (0-10 with 10 being normal): 10 IMPRESSION: 1. No acute finding. 2. Remote left cerebral infarction that only spares the ACA distribution. Electronically Signed   By: Marnee SpringJonathon  Watts M.D.   On: 04/20/2020 11:47    Procedures Procedures (including critical care time)  Medications Ordered in ED Medications   stroke: mapping our early stages of recovery book (has no administration in time range)  acetaminophen (TYLENOL) tablet 650 mg (has no administration in time range)    Or  acetaminophen (TYLENOL) 160 MG/5ML solution 650 mg (has no administration in time range)    Or  acetaminophen (TYLENOL) suppository 650 mg (has  no administration in time range)  enoxaparin (LOVENOX) injection 40 mg (40 mg Subcutaneous Given 04/20/20 1405)  amLODipine (NORVASC) tablet 10 mg (has no administration in time range)  aspirin EC tablet 325 mg (has no administration in time range)  atorvastatin (LIPITOR) tablet 40 mg (has no administration in time range)  lisinopril (ZESTRIL) tablet 20 mg (has no administration in time range)  indapamide (LOZOL) tablet 1.25 mg (has no administration in time range)  carvedilol (COREG) tablet 12.5 mg (has no administration in time range)  traZODone (DESYREL) tablet 25 mg (has no administration in time range)  insulin aspart (novoLOG) injection 0-15 Units (has no administration in time range)  tamsulosin (FLOMAX) capsule 0.4 mg (has no administration in time range)  clopidogrel (PLAVIX) tablet 75 mg (has no administration in time range)  midodrine (PROAMATINE) tablet 5 mg (has no administration in time range)  lamoTRIgine (LAMICTAL) tablet 150 mg (has no administration in time range)  0.9 %  sodium chloride infusion ( Intravenous New Bag/Given 04/20/20 1413)    ED Course  I have reviewed the triage vital signs and the nursing notes.  Pertinent labs & imaging results that were available during my care of the patient were reviewed by me and considered in my medical decision making (see chart for details).    MDM Rules/Calculators/A&P                          Douglas CordiaWilliam L Volk Jr. is a 69 y.o. male with a past medical history significant for hypertension, hyperlipidemia, diabetes, seizures, and  significant left-sided stroke with residual aphasia and right-sided weakness who presents as a code stroke.  According to EMS, patient was last at his baseline at 3 AM.  He was found this morning altered and more somnolent than baseline.  He was made a code stroke.  This history was from daughter who found him this morning and last saw him at 3 AM.  According to neurology after speaking with wife, patient  actually was at his baseline at 9:30 AM.  According to daughter, this morning, patient was using his left hand pushed into the wall and was not answering questions like he sometimes can.  He is now only saying yes to things when he normally will have some other words in response.  Otherwise, he is still moving his left arm and left leg.  He has residual right-sided deficits and aphasia from prior large stroke.  Daughter was concerned that he was not acting normally and call 911.  She also reported his blood pressure was elevated 170 systolic prior to EMS arrival.  She says that she try to give his medicines with you just holding them in his mouth and would not swallow them.  This is abnormal for him.  Of note, daughter does report patient's urine was darker than normal.  On arrival, patient is protecting his airway and answering yes to answers.  He is moving his left arm and left leg on command.  His airway was felt to be protected and clear.  Patient taken to CT scanner where he had imaging.  Patient CT scan shows the large old stroke but no evidence of new changes.  On my exam, lungs were clear and chest was nontender.  Abdomen did not appear to be tender.  He was able to move the left arm and left leg but would not move the right side.  Right-sided facial droop present.  He is unable to speak other than some answer yes to questions.  Pupils are symmetric and reactive and he was able to move them back-and-forth.  Neurology feels he needs EEG, MRI, admission for altered mental status and possible stroke.  We will call medicine for admission when work-up is further along.   Final Clinical Impression(s) / ED Diagnoses Final diagnoses:  Altered mental status, unspecified altered mental status type     Clinical Impression: 1. Altered mental status, unspecified altered mental status type     Disposition: Admit  This note was prepared with assistance of Dragon voice recognition software.  Occasional wrong-word or sound-a-like substitutions may have occurred due to the inherent limitations of voice recognition software.     Saad Buhl, Canary Brim, MD 04/20/20 [id number]

## 2020-04-20 NOTE — Telephone Encounter (Signed)
Noted  

## 2020-04-20 NOTE — ED Triage Notes (Signed)
Patient presents to ED via ems family states they checked on her dad at 330 am and he was at his baseline , wife saw patient at 0930 daughter gave him his medication this am and noticed he was having problems swallowing his pills not choking but just not wanting to swallow. Patient is normally flaccid on the right from a previous stroke in 2020. Patient also has a history of seizure. incont of urine.

## 2020-04-21 ENCOUNTER — Encounter (HOSPITAL_COMMUNITY): Payer: Self-pay | Admitting: Internal Medicine

## 2020-04-21 DIAGNOSIS — Z7984 Long term (current) use of oral hypoglycemic drugs: Secondary | ICD-10-CM | POA: Diagnosis not present

## 2020-04-21 DIAGNOSIS — R9401 Abnormal electroencephalogram [EEG]: Secondary | ICD-10-CM | POA: Diagnosis not present

## 2020-04-21 DIAGNOSIS — G40909 Epilepsy, unspecified, not intractable, without status epilepticus: Principal | ICD-10-CM

## 2020-04-21 DIAGNOSIS — N179 Acute kidney failure, unspecified: Secondary | ICD-10-CM | POA: Diagnosis not present

## 2020-04-21 DIAGNOSIS — R4182 Altered mental status, unspecified: Secondary | ICD-10-CM

## 2020-04-21 DIAGNOSIS — N138 Other obstructive and reflux uropathy: Secondary | ICD-10-CM | POA: Diagnosis present

## 2020-04-21 DIAGNOSIS — Z20822 Contact with and (suspected) exposure to covid-19: Secondary | ICD-10-CM | POA: Diagnosis present

## 2020-04-21 DIAGNOSIS — E785 Hyperlipidemia, unspecified: Secondary | ICD-10-CM | POA: Diagnosis present

## 2020-04-21 DIAGNOSIS — M24531 Contracture, right wrist: Secondary | ICD-10-CM | POA: Diagnosis present

## 2020-04-21 DIAGNOSIS — Z23 Encounter for immunization: Secondary | ICD-10-CM | POA: Diagnosis present

## 2020-04-21 DIAGNOSIS — G9341 Metabolic encephalopathy: Secondary | ICD-10-CM | POA: Diagnosis not present

## 2020-04-21 DIAGNOSIS — I69320 Aphasia following cerebral infarction: Secondary | ICD-10-CM | POA: Diagnosis not present

## 2020-04-21 DIAGNOSIS — Z993 Dependence on wheelchair: Secondary | ICD-10-CM | POA: Diagnosis not present

## 2020-04-21 DIAGNOSIS — E43 Unspecified severe protein-calorie malnutrition: Secondary | ICD-10-CM | POA: Diagnosis present

## 2020-04-21 DIAGNOSIS — I951 Orthostatic hypotension: Secondary | ICD-10-CM | POA: Diagnosis present

## 2020-04-21 DIAGNOSIS — N401 Enlarged prostate with lower urinary tract symptoms: Secondary | ICD-10-CM | POA: Diagnosis present

## 2020-04-21 DIAGNOSIS — I69351 Hemiplegia and hemiparesis following cerebral infarction affecting right dominant side: Secondary | ICD-10-CM | POA: Diagnosis not present

## 2020-04-21 DIAGNOSIS — R32 Unspecified urinary incontinence: Secondary | ICD-10-CM | POA: Diagnosis present

## 2020-04-21 DIAGNOSIS — Z7902 Long term (current) use of antithrombotics/antiplatelets: Secondary | ICD-10-CM | POA: Diagnosis not present

## 2020-04-21 DIAGNOSIS — G9340 Encephalopathy, unspecified: Secondary | ICD-10-CM | POA: Diagnosis present

## 2020-04-21 DIAGNOSIS — E1142 Type 2 diabetes mellitus with diabetic polyneuropathy: Secondary | ICD-10-CM | POA: Diagnosis present

## 2020-04-21 DIAGNOSIS — Z794 Long term (current) use of insulin: Secondary | ICD-10-CM | POA: Diagnosis not present

## 2020-04-21 DIAGNOSIS — I129 Hypertensive chronic kidney disease with stage 1 through stage 4 chronic kidney disease, or unspecified chronic kidney disease: Secondary | ICD-10-CM | POA: Diagnosis present

## 2020-04-21 DIAGNOSIS — Z7982 Long term (current) use of aspirin: Secondary | ICD-10-CM | POA: Diagnosis not present

## 2020-04-21 DIAGNOSIS — N182 Chronic kidney disease, stage 2 (mild): Secondary | ICD-10-CM | POA: Diagnosis present

## 2020-04-21 DIAGNOSIS — Z6820 Body mass index (BMI) 20.0-20.9, adult: Secondary | ICD-10-CM | POA: Diagnosis not present

## 2020-04-21 DIAGNOSIS — E1122 Type 2 diabetes mellitus with diabetic chronic kidney disease: Secondary | ICD-10-CM | POA: Diagnosis present

## 2020-04-21 DIAGNOSIS — Z87440 Personal history of urinary (tract) infections: Secondary | ICD-10-CM | POA: Diagnosis not present

## 2020-04-21 DIAGNOSIS — L89152 Pressure ulcer of sacral region, stage 2: Secondary | ICD-10-CM | POA: Diagnosis present

## 2020-04-21 LAB — GLUCOSE, CAPILLARY
Glucose-Capillary: 177 mg/dL — ABNORMAL HIGH (ref 70–99)
Glucose-Capillary: 162 mg/dL — ABNORMAL HIGH (ref 70–99)
Glucose-Capillary: 160 mg/dL — ABNORMAL HIGH (ref 70–99)

## 2020-04-21 NOTE — Evaluation (Signed)
Speech Language Pathology Evaluation Patient Details Name: Douglas [name]. MRN: [id number] DOB: [birth year] Today's Date: 04/21/2020 Time: 3536-1443 SLP Time Calculation (min) (ACUTE ONLY): 20 min  Problem List:  Patient Active Problem List   Diagnosis Date Noted  . Acute metabolic encephalopathy 04/20/2020  . Hyperlipidemia 04/20/2020  . Slow transit constipation 12/26/2019  . Seizure (HCC) 11/20/2019  . Seizures (HCC) 11/20/2019  . Encephalopathy 11/19/2019  . Delayed orthostatic hypotension 08/04/2019  . Seizure disorder (HCC) 07/21/2019  . 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 Shoulder subluxation, right, sequela 07/16/2019  . Altered mental status 07/15/2019  . 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 Late effects of CVA (cerebrovascular accident)   . Pressure injury of skin 06/08/2019  . History of loop recorder 03/08/2019  . UTI (urinary tract infection) 03/05/2019  . 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 Global aphasia   . Type 2 diabetes mellitus with peripheral neuropathy (HCC)   . Hypertension associated with diabetes (HCC)   . Hyperlipidemia associated with type 2 diabetes mellitus (HCC)   . History of Dysphagia, post-stroke    Past Medical History:  Past Medical History:  Diagnosis Date  . Acute blood loss anemia   . Acute embolic stroke (HCC)   . Acute lower UTI   . AMS (altered mental status) 06/05/2019  . Cerebral edema (HCC) 04/04/2018  . Diabetes 1.5, managed as type 2 (HCC)   . Diabetes mellitus without complication (HCC)   . High cholesterol   . High glucose    Diabetes  . History of CVA with residual deficit   . History of Dysphagia, post-stroke   . Hypertension   . Ischemic stroke (HCC) 03/05/2019  . Labile blood glucose   . 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 Renal insufficiency 04/04/2018  . Seizures (HCC)   . Stroke (HCC)   . UTI  (urinary tract infection) 03/05/2019   Past Surgical History:  Past Surgical History:  Procedure Laterality Date  . IR PATIENT EVAL TECH 0-60 MINS  03/30/2018  . LOOP RECORDER INSERTION N/A 03/08/2019   Procedure: LOOP RECORDER INSERTION;  Surgeon: Regan Lemming, MD;  Location: [facility] INVASIVE CV LAB;  Service: Cardiovascular;  Laterality: N/A;  . TEE WITHOUT CARDIOVERSION N/A 04/02/2018   Procedure: TRANSESOPHAGEAL ECHOCARDIOGRAM (TEE);  Surgeon: [name], MD;  Location[facility] ENDOSCOPY;  Service: Cardiovascular;  Laterality: N/A;   HPI:  Patient is a 69 y.o. male with PMH: HTN, HLD, DM-2, multiple CVA with residual right-sided hemiparesis, seizures, who was found by daughter pushing into the wall, acting more drowsy than normal and with elevated blood pressure. EMS was called for code stroke. At baseline, patient has right-sided deficits with no movement of right extremities, contracture of right UE, aphasia: "yes/no" are his only words and he uses gestures to communicate. He is wheelchair bound with assistance to stand for toileting.   Assessment / Plan / Recommendation Clinical Impression  Patient presents with a severe mixed receptive-expressive aphasia with likely cognitive impairment as well, but difficult to differentiate. Patient was able to manage cup to take sips and use spoon to take bites of puree after SLP setup with impulsivity but no significant difficulty in performance. He performed one verbal command to 'open mouth' but then subsequent oral-motor exam questions resulted in him opening his mouth only. Patient verbalized "yes" with modulation of vocal intensity at times, but this appeared to be  a means to get attention/assistance. For example, he said "yes" while looking at and gesturing to the TV on the wall and when SLP asked him if he wanted the station changed, he put hand out in the 'stop' gesture. Patient also verbalized/vocalized while gesturing towards cup when empty.  He does not point and will open and close hand and move it in seemingly unintentional manner and so it is difficult to determine exactly what he is trying to communicate. Patient has baseline of only verbalizing 'yes/no' words and he may be at his cognitive-linguistic baseline at this time, however will benefit from brief period of SLP treatment to aid in swallow function and basic level communication.    SLP Assessment  SLP Recommendation/Assessment: Patient needs continued Speech Lanaguage Pathology Services SLP Visit Diagnosis: Cognitive communication deficit (R41.841);Aphasia (R47.01)    Follow Up Recommendations  Skilled Nursing facility;24 hour supervision/assistance    Frequency and Duration min 2x/week         SLP Evaluation Cognition  Overall Cognitive Status: No family/caregiver present to determine baseline cognitive functioning Arousal/Alertness: Awake/alert Orientation Level: Other (comment) (unable to demonstrate orientation secondary to severe expressive and receptive aphasia) Attention: Focused;Selective Focused Attention: Appears intact Selective Attention: Impaired Selective Attention Impairment: Functional basic Memory:  (unable to assess secondary to severe expressive aphasia) Executive Function: Initiating;Self Monitoring Initiating: Appears intact Self Monitoring: Impaired Self Monitoring Impairment: Functional basic Behaviors: Impulsive Safety/Judgment: Other (comment) (difficult to determine. Patient was not exhibiting any unsafe behaviors during this evaluation (not trying to get out of bed, etc))       Comprehension  Auditory Comprehension Overall Auditory Comprehension: Impaired Yes/No Questions: Impaired Basic Biographical Questions: 0-25% accurate Basic Immediate Environment Questions: 0-24% accurate Commands: Impaired One Step Basic Commands: 0-24% accurate Conversation: Other (comment) (only said "yes") EffectiveTechniques: Other (Comment)  (required a lot of hand over hand but with limited success) Visual Recognition/Discrimination Discrimination: Not tested    Expression Expression Primary Mode of Expression: Nonverbal - gestures Verbal Expression Overall Verbal Expression: Impaired Initiation: Impaired Level of Generative/Spontaneous Verbalization: Word Repetition: Impaired Level of Impairment: Word level Naming: Impairment Responsive: Not tested (0%) Confrontation: Impaired (0%) Convergent: 0-24% accurate Divergent: Not tested Verbal Errors: Not aware of errors Pragmatics: Impairment Impairments: Eye contact Interfering Components: Attention;Premorbid deficit Non-Verbal Means of Communication: Gestures Other Verbal Expression Comments: Patient only verbalized "yes" but this was not necessarily to respond to yes/no questions. He would gesture and say "yes" sometimes with increased vocal intensity to get attention while gesturing with hand and eye gaze to something in room. (He did this to request TV on and to request more juice).   Oral / Motor  Oral Motor/Sensory Function Overall Oral Motor/Sensory Function: Mild impairment Facial ROM: Within Functional Limits Facial Symmetry: Within Functional Limits Facial Strength: Within Functional Limits Lingual ROM: Reduced right;Reduced left Lingual Symmetry: Within Functional Limits Lingual Strength: Reduced Motor Speech Overall Motor Speech: Other (comment) (unable to assess secondary to very limited verbal output)   GO                    Angela Nevin, MA, CCC-SLP Speech Therapy [facility] Acute Rehab

## 2020-04-21 NOTE — Evaluation (Signed)
Clinical/Bedside Swallow Evaluation Patient Details  Name: Douglas [name]. MRN: [id number] Date of Birth: Sep 10, 1950  Today's Date: 04/21/2020 Time: SLP Start Time (ACUTE ONLY): 0845 SLP Stop Time (ACUTE ONLY): 0905 SLP Time Calculation (min) (ACUTE ONLY): 20 min  Past Medical History:  Past Medical History:  Diagnosis Date  . Acute blood loss anemia   . Acute embolic stroke (HCC)   . Acute lower UTI   . AMS (altered mental status) 06/05/2019  . Cerebral edema (HCC) 04/04/2018  . Diabetes 1.5, managed as type 2 (HCC)   . Diabetes mellitus without complication (HCC)   . High cholesterol   . High glucose    Diabetes  . History of CVA with residual deficit   . History of Dysphagia, post-stroke   . Hypertension   . Ischemic stroke (HCC) 03/05/2019  . Labile blood glucose   . 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 Renal insufficiency 04/04/2018  . Seizures (HCC)   . Stroke (HCC)   . UTI (urinary tract infection) 03/05/2019   Past Surgical History:  Past Surgical History:  Procedure Laterality Date  . IR PATIENT EVAL TECH 0-60 MINS  03/30/2018  . LOOP RECORDER INSERTION N/A 03/08/2019   Procedure: LOOP RECORDER INSERTION;  Surgeon: Regan Lemming, MD;  Location: [facility];  Service: Cardiovascular;  Laterality: N/A;  . TEE WITHOUT CARDIOVERSION N/A 04/02/2018   Procedure: TRANSESOPHAGEAL ECHOCARDIOGRAM (TEE);  Surgeon: [name], MD;  Location: [facility] ENDOSCOPY;  Service: Cardiovascular;  Laterality: N/A;   HPI:  Patient is a 69 y.o. male with PMH: HTN, HLD, DM-2, multiple CVA with residual right-sided hemiparesis, seizures, who was found by daughter pushing into the wall, acting more drowsy than normal and with elevated blood pressure. EMS was called for code stroke. At baseline, patient has right-sided deficits with no movement of right extremities, contracture of right UE, aphasia: "yes/no" are his only words and he uses gestures to communicate.  He is wheelchair bound with assistance to stand for toileting.   Assessment / Plan / Recommendation Clinical Impression  Patient presents with a moderate oropharyngeal dysphagia consisting of suspected delays in swallow initiation with all tested bolus consistencies, immediate and delayed coughing with water. Patient was able to feed self with spoon for purees and manage cup sips of liquids but appears impulsive. No coughing, throat clearing or overt s/s of aspiration or penetration were observed with honey thick liquids and puree solids. As patient has a  history of dysphagia with most recent MBS in July of 2021, recommend initiating Dys 1, honey thick liquids diet and proceed with MBS to determine if able to safely upgrade consistencies. SLP Visit Diagnosis: Dysphagia, unspecified (R13.10)    Aspiration Risk  Moderate aspiration risk    Diet Recommendation Honey-thick liquid;Dysphagia 1 (Puree)   Liquid Administration via: Cup Medication Administration: Crushed with puree Supervision: Patient able to self feed;Full supervision/cueing for compensatory strategies Compensations: Minimize environmental distractions;Slow rate;Small sips/bites Postural Changes: Seated upright at 90 degrees    Other  Recommendations Oral Care Recommendations: Oral care BID;Staff/trained caregiver to provide oral care Other Recommendations: Order thickener from pharmacy;Clarify dietary restrictions;Prohibited food (jello, ice cream, thin soups);Remove water pitcher   Follow up Recommendations Skilled Nursing facility;24 hour supervision/assistance      Frequency and Duration min 2x/week  2 weeks       Prognosis Prognosis for Safe Diet Advancement: Good Barriers to Reach Goals: Time post onset;Severity of deficits      Swallow  Study   General Date of Onset: 04/21/20 HPI: Patient is a 69 y.o. male with PMH: HTN, HLD, DM-2, multiple CVA with residual right-sided hemiparesis, seizures, who was found by  daughter pushing into the wall, acting more drowsy than normal and with elevated blood pressure. EMS was called for code stroke. At baseline, patient has right-sided deficits with no movement of right extremities, contracture of right UE, aphasia: "yes/no" are his only words and he uses gestures to communicate. He is wheelchair bound with assistance to stand for toileting. Type of Study: Bedside Swallow Evaluation Previous Swallow Assessment: MBS in July 2021 recommending Dys 2, nectar thick liquids Diet Prior to this Study: NPO Temperature Spikes Noted: No Respiratory Status: Room air History of Recent Intubation: No Behavior/Cognition: Alert;Cooperative;Pleasant mood Oral Cavity Assessment: Within Functional Limits Oral Care Completed by SLP: Yes Oral Cavity - Dentition: Adequate natural dentition Vision: Functional for self-feeding Self-Feeding Abilities: Able to feed self;Needs set up Patient Positioning: Upright in bed Baseline Vocal Quality: Normal Volitional Swallow: Unable to elicit    Oral/Motor/Sensory Function Overall Oral Motor/Sensory Function: Mild impairment Facial ROM: Within Functional Limits Facial Symmetry: Within Functional Limits Facial Strength: Within Functional Limits Lingual ROM: Reduced right;Reduced left Lingual Symmetry: Within Functional Limits Lingual Strength: Reduced   Ice Chips Ice chips: Impaired Pharyngeal Phase Impairments: Suspected delayed Swallow   Thin Liquid Thin Liquid: Impaired Presentation: Cup Pharyngeal  Phase Impairments: Suspected delayed Swallow;Cough - Immediate;Cough - Delayed    Nectar Thick Nectar Thick Liquid: Not tested   Honey Thick Honey Thick Liquid: Impaired Presentation: Cup;Self fed Pharyngeal Phase Impairments: Suspected delayed Swallow   Puree Puree: Impaired Presentation: Self Fed;Spoon Pharyngeal Phase Impairments: Suspected delayed Swallow   Solid     Solid: Not tested      Angela Nevin, MA,  CCC-SLP Speech Therapy [facility]

## 2020-04-21 NOTE — Assessment & Plan Note (Addendum)
-  Continue SSI and CBG monitoring -Last A1c 6.5% on 03/06/2020 -Levemir on hold; resume at 5 units daily at discharge. Will need to be adjusted further  - resume glipizide; CrCl borderline for metformin (possibly lower clearance in past given on "allergy" list); at risk for hypoglycemia as age advances - add [place]

## 2020-04-21 NOTE — Assessment & Plan Note (Signed)
-  Likely postictal from seizure. No active seizure on EEG -Neurology has seen patient -Continue current seizure regimen -Baseline mentation is answering "yes" to all questions which he is perceivably back to baseline at this point

## 2020-04-21 NOTE — Progress Notes (Signed)
04/21/20 1508  PT Evaluation Information  Last PT Received On 04/21/20  Assistance Needed +2  History of Present Illness 69 yo male presenting with acute metabolic encephalopathy with concern for seizure vs. CVA. Previous hospital admission 10/2019 with AMS and concern for seizure vs. underlying CVA. 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  Precautions  Precautions Fall  Restrictions  Weight Bearing Restrictions No  Home Living  Family/patient expects to be discharged to: Private residence  Living Arrangements Children;Spouse/significant other  Available Help at Discharge Family;Available 24 hours/day;Other (Comment);Personal care attendant  Type of Home House  Home Access Level entry  Home Layout One level  Bathroom Shower/Tub Walk-in shower  Bathroom Toilet Standard (BSC over top)  Home Equipment Tub bench;Wheelchair - manual;BSC;Hospital bed;Transport chair;Hand held shower head;Grab bars - tub/shower;Other (comment) (hemiwalker)  Additional Comments Information from OT session as no family present during PT session.  Prior Function  Level of Independence Needs assistance  Gait / Transfers Assistance Needed Assist for ADL transfers including toilet transfers and walk-in shower transfers with use of hemi walker. Patient also able to self-propel standard wc in home. Use of manual wc vs transport chair in community dwellings.  ADL's / Homemaking Assistance Needed Assist with all ADLs at baseline. Could bathe UB with set-up to Min A.  Communication / Swallowing Assistance Needed Aphasic at baseline. Can say a few words including yes, no and wow.  Comments Info from OT notes  Communication  Communication Expressive difficulties;Receptive difficulties  Pain Assessment  Pain Assessment Faces  Faces Pain Scale 0  Cognition  Arousal/Alertness Awake/alert  Behavior During Therapy Flat affect  Overall Cognitive Status No  family/caregiver present to determine baseline cognitive functioning  General Comments Pt responding yes to all questions.  Upper Extremity Assessment  Upper Extremity Assessment Defer to OT evaluation  Lower Extremity Assessment  Lower Extremity Assessment RLE deficits/detail  RLE Deficits / Details R residual spastic hemiplegia at baseline  Cervical / Trunk Assessment  Cervical / Trunk Assessment Normal  Bed Mobility  Overal bed mobility Needs Assistance  Bed Mobility Supine to Sit;Sit to Supine  Supine to sit Max assist  Sit to supine Total assist  General bed mobility comments Max A for trunk and LE assist. Able to assist some with LUE. Total A to return to supine.  Transfers  General transfer comment Unable to stand with +1 assist.  Balance  Overall balance assessment Needs assistance  Sitting-balance support Single extremity supported;Feet supported  Sitting balance-Leahy Scale Poor  Sitting balance - Comments Reliant on LUE for support  PT - End of Session  Equipment Utilized During Treatment Gait belt  Activity Tolerance Patient tolerated treatment well  Patient left in bed;with call bell/phone within reach;with bed alarm set;with nursing/sitter in room  Nurse Communication Mobility status  PT Assessment  PT Recommendation/Assessment Patient needs continued PT services  PT Visit Diagnosis Unsteadiness on feet (R26.81);Other abnormalities of gait and mobility (R26.89);Difficulty in walking, not elsewhere classified (R26.2);Hemiplegia and hemiparesis  Hemiplegia - Right/Left Right  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 infarction  PT Problem List Decreased knowledge of precautions;Decreased safety awareness;Decreased knowledge of use of DME;Decreased cognition;Decreased coordination;Decreased balance;Decreased mobility;Decreased activity tolerance;Decreased range of motion;Decreased strength  PT Plan  PT Frequency (ACUTE ONLY) Min 2X/week  PT Treatment/Interventions (ACUTE ONLY)  Functional mobility training;Therapeutic activities;DME instruction;Therapeutic exercise;Balance training;Patient/family education;Cognitive remediation  AM-PAC PT "6 Clicks" Mobility Outcome Measure (Version 2)  Help needed turning from your  back to your side while in a flat bed without using bedrails? 2  Help needed moving from lying on your back to sitting on the side of a flat bed without using bedrails? 2  Help needed moving to and from a bed to a chair (including a wheelchair)? 1  Help needed standing up from a chair using your arms (e.g., wheelchair or bedside chair)? 1  Help needed to walk in hospital room? 1  Help needed climbing 3-5 steps with a railing?  1  6 Click Score 8  Consider Recommendation of Discharge To: CIR/SNF/LTACH  PT Recommendation  Follow Up Recommendations SNF;Supervision/Assistance - 24 hour  PT equipment None recommended by PT  Individuals Consulted  Consulted and Agree with Results and Recommendations Patient unable/family or caregiver not available  Acute Rehab PT Goals  PT Goal Formulation Patient unable to participate in goal setting  Time For Goal Achievement 05/05/20  Potential to Achieve Goals Fair  PT Time Calculation  PT Start Time (ACUTE ONLY) 1124  PT Stop Time (ACUTE ONLY) 1140  PT Time Calculation (min) (ACUTE ONLY) 16 min  PT General Charges  $$ ACUTE PT VISIT 1 Visit  PT Evaluation  $PT Eval Moderate Complexity 1 Mod  Written Expression  Dominant Hand Right    Pt admitted secondary to problem above with deficits above. Pt requiring max to total A for bed mobility tasks. Notable hemiplegia at baseline in R extremities. Pt responding yes to all questions; unsure of baseline as no family present. Feel pt would benefit from SNF level therapies at d/c to increase independence and safety. Will continue to follow acutely.   Farley Ly, PT, DPT  Acute Rehabilitation Services  Pager: [phone] Office: [phone]

## 2020-04-21 NOTE — Progress Notes (Signed)
PROGRESS NOTE    Douglas [name].   WUJ:[id number]  DOB: 07/20/1950  DOA: 04/20/2020     0  PCP: Caesar Bookman, NP  CC: drowsy  Hospital Course: 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 His baseline is that he says "yes" to any question asked and has severe right-sided hemiplegia. However, he is able to follow some commands at home such as handing his daughter a drink for her to open or other simple gestures. There was concern for possible stroke on admission and neurology was also consulted. He underwent stroke work-up with CT head and MRI brain as well as EEG. No acute findings were seen on MRI, just old chronic and large left MCA/PCA infarcts.  EEG revealed cortical dysfunction in the left hemisphere with slowing noted which was considered to have possibly been associated with a post ictal state. No active seizure activity noted on EEG. Etiology of his presentation was considered likely due to a probable underlying seizure at home prior to admission but no acute stroke. He was recommended by neurology to continue on previous regimen of seizure medication and secondary stroke prevention medications. He was also evaluated by physical therapy and his daughter was hopeful for patient going to rehab at discharge as she states that he regained some function and came home stronger after rehab.   Interval History:  Seen in his room this afternoon. He was resting in bed comfortably and answering "yes" to all my questions. He would squeeze my hand spontaneously with his left hand but could not follow any commands.  Old records reviewed in assessment of this patient  ROS: Review of systems not obtained due to patient factors. Cognitive impairment  Assessment & Plan: * Acute metabolic encephalopathy -Likely postictal from  seizure. No active seizure on EEG -Neurology has seen patient -Continue current seizure regimen -Baseline mentation is answering "yes" to all questions which he is perceivably back to baseline at this point  Seizure disorder (HCC) -Slowing seen on EEG but no active seizure activity -Continue Lamictal 150 mg twice daily. If witnessed seizure, per neurology would then increase to 200 mg twice daily  History of CVA (cerebrovascular accident) -No new strokes seen on MRI brain -Large left MCA/PCA old strokes noted -Continue aspirin, Plavix, statin  Hyperlipidemia -Continue statin  BPH with obstruction/lower urinary tract symptoms -Continue Flomax  Type 2 diabetes mellitus with peripheral neuropathy (HCC) -Continue SSI and CBG monitoring -Last A1c 6.5% on 03/06/2020 -Levemir on hold    Antimicrobials:   DVT prophylaxis: Lovenox Code Status: Full Family Communication: None present Disposition Plan: Status is: Inpatient  Remains inpatient appropriate because:Altered mental status, Unsafe d/c plan and Inpatient level of care appropriate due to severity of illness   Dispo: The patient is from: Home              Anticipated d/c is to: SNF              Anticipated d/c date is: > 3 days              Patient currently is not medically stable to d/c.       Objective: Blood pressure 135/74, pulse 78, temperature (!) 97.5 F (36.4 C), temperature source Oral, resp. rate 16, height  (1.854 m), weight 72 kg, SpO2 100 %.  Examination: General appearance:  Chronically ill elderly man appearing older than stated age lying in bed in no obvious distress answering "yes" to all questions and he is not following any commands Head: Normocephalic, without obvious abnormality, atraumatic Eyes: EOMI Lungs: clear to auscultation bilaterally Heart: regular rate and rhythm and S1, S2 normal Abdomen: normal findings: bowel sounds normal and soft, non-tender Extremities: Contracted right  wrist. No edema Skin: mobility and turgor normal Neurologic: Answers "yes" to all questions. Does not follow any commands. Right-sided hemiplegia noted  Consultants:   Neurology  Procedures:   EEG  Data Reviewed: I have personally reviewed following labs and imaging studies Results for orders placed or performed during the hospital encounter of 04/20/20 (from the past 24 hour(s))  CBG monitoring, ED     Status: Abnormal   Collection Time: 04/20/20  6:37 PM  Result Value Ref Range   Glucose-Capillary 140 (H) 70 - 99 mg/dL  Urine rapid drug screen (hosp performed)     Status: None   Collection Time: 04/20/20  8:47 PM  Result Value Ref Range   Opiates NONE DETECTED NONE DETECTED   Cocaine NONE DETECTED NONE DETECTED   Benzodiazepines NONE DETECTED NONE DETECTED   Amphetamines NONE DETECTED NONE DETECTED   Tetrahydrocannabinol NONE DETECTED NONE DETECTED   Barbiturates NONE DETECTED NONE DETECTED  Urinalysis, Routine w reflex microscopic     Status: Abnormal   Collection Time: 04/20/20  8:47 PM  Result Value Ref Range   Color, Urine YELLOW YELLOW   APPearance CLEAR CLEAR   Specific Gravity, Urine 1.018 1.005 - 1.030   pH 5.0 5.0 - 8.0   Glucose, UA NEGATIVE NEGATIVE mg/dL   Hgb urine dipstick MODERATE (A) NEGATIVE   Bilirubin Urine NEGATIVE NEGATIVE   Ketones, ur NEGATIVE NEGATIVE mg/dL   Protein, ur 30 (A) NEGATIVE mg/dL   Nitrite NEGATIVE NEGATIVE   Leukocytes,Ua NEGATIVE NEGATIVE   RBC / HPF 0-5 0 - 5 RBC/hpf   WBC, UA 0-5 0 - 5 WBC/hpf   Bacteria, UA NONE SEEN NONE SEEN   Squamous Epithelial / LPF 0-5 0 - 5  CBG monitoring, ED     Status: Abnormal   Collection Time: 04/20/20 11:44 PM  Result Value Ref Range   Glucose-Capillary 141 (H) 70 - 99 mg/dL  Glucose, capillary     Status: Abnormal   Collection Time: 04/21/20 10:40 AM  Result Value Ref Range   Glucose-Capillary 177 (H) 70 - 99 mg/dL  Glucose, capillary     Status: Abnormal   Collection Time: 04/21/20  4:31  PM  Result Value Ref Range   Glucose-Capillary 162 (H) 70 - 99 mg/dL    Recent Results (from the past 240 hour(s))  Resp Panel by RT-PCR (Flu A&B, Covid) Nasopharyngeal Swab     Status: None   Collection Time: 04/20/20 12:28 PM   Specimen: Nasopharyngeal Swab; Nasopharyngeal(NP) swabs in vial transport medium  Result Value Ref Range Status   SARS Coronavirus 2 by RT PCR NEGATIVE NEGATIVE Final    Comment: (NOTE) SARS-CoV-2 target nucleic acids are NOT DETECTED.  The SARS-CoV-2 RNA is generally detectable in upper respiratory specimens during the acute phase of infection. The lowest concentration of SARS-CoV-2 viral copies this assay can detect is 138 copies/mL. A negative result does not preclude SARS-Cov-2 infection and should not be used as the sole basis for treatment or other patient management decisions. A negative result may occur with  improper specimen collection/handling, submission of specimen other than nasopharyngeal swab, presence of viral  mutation(s) within the areas targeted by this assay, and inadequate number of viral copies(<138 copies/mL). A negative result must be combined with clinical observations, patient history, and epidemiological information. The expected result is Negative.  Fact Sheet for Patients:  BloggerCourse.com  Fact Sheet for Healthcare Providers:  SeriousBroker.it  This test is no t yet approved or cleared by the [place] FDA and  has been authorized for detection and/or diagnosis of SARS-CoV-2 by FDA under an Emergency Use Authorization (EUA). This EUA will remain  in effect (meaning this test can be used) for the duration of the COVID-19 declaration under Section 564(b)(1) of the Act, 21 U.S.C.section 360bbb-3(b)(1), unless the authorization is terminated  or revoked sooner.       Influenza A by PCR NEGATIVE NEGATIVE Final   Influenza B by PCR NEGATIVE NEGATIVE Final    Comment:  (NOTE) The Xpert Xpress SARS-CoV-2/FLU/RSV plus assay is intended as an aid in the diagnosis of influenza from Nasopharyngeal swab specimens and should not be used as a sole basis for treatment. Nasal washings and aspirates are unacceptable for Xpert Xpress SARS-CoV-2/FLU/RSV testing.  Fact Sheet for Patients: BloggerCourse.com  Fact Sheet for Healthcare Providers: SeriousBroker.it  This test is not yet approved or cleared by the [place] FDA and has been authorized for detection and/or diagnosis of SARS-CoV-2 by FDA under an Emergency Use Authorization (EUA). This EUA will remain in effect (meaning this test can be used) for the duration of the COVID-19 declaration under Section 564(b)(1) of the Act, 21 U.S.C. section 360bbb-3(b)(1), unless the authorization is terminated or revoked.  Performed at [facility], [address], [place], [place] [address]      Radiology Studies: EEG  Result Date: 04/21/2020 Charlsie Quest, MD     04/21/2020  8:47 AM Patient Name: Douglas [name]. MRN: [id number] Epilepsy Attending: Charlsie Quest Referring Physician/Provider: Dr. Madelyn Flavors Date: 04/20/2020 Duration: 25.34 minutes Patient history: 69 year old male with history of multiple CVAs with right-sided deficits, seizures on lamotrigine who presented with altered mental status, aphasia, left-sided weakness. EEG to evaluate for seizures. Level of alertness: Awake, drowsy, sleep, comatose, lethargic AEDs during EEG study: Lamotrigine Technical aspects: This EEG study was done with scalp electrodes positioned according to the 10-20 International system of electrode placement. Electrical activity was acquired at a sampling rate of 500Hz  and reviewed with a high frequency filter of 70Hz  and a low frequency filter of 1Hz . EEG data were recorded continuously and digitally stored. Description: The posterior dominant rhythm consists of  8 Hz activity of moderate voltage (25-35 uV) seen predominantly in posterior head regions, asymmetric(L<R) and reactive to eye opening and eye closing. EEG showed continuous left hemispheric 3 to 6 Hz theta-delta slowing.  Hyperventilation and photic stimulation were not performed.   ABNORMALITY - Continuous slow, left hemisphere - Background asymmetry, left<right  IMPRESSION: This study is suggestive of cortical dysfunction in left hemisphere likely secondary to underlying infarct and can also be seen in post-ictal state. No seizures or epileptiform discharges were seen throughout the recording.    MR BRAIN WO CONTRAST  Result Date: 04/20/2020 CLINICAL DATA:  Mental status change, unknown cause EXAM: MRI HEAD WITHOUT CONTRAST TECHNIQUE: Multiplanar, multiecho pulse sequences of the brain and surrounding structures were obtained without intravenous contrast. COMPARISON:  04/20/2020 and prior. FINDINGS: Brain: Chronic 3-4 mm focal left corona radiata restricted diffusion, less conspicuous than prior exam. Minimal cortically based restricted diffusion involving residual left parietal parenchyma,  chronic sequela demonstrated on prior exams. Chronic left MCA and PCA territory infarcts with sequela of wallerian degeneration, superficial siderosis and cortical laminar necrosis. Ex vacuo dilatation of the left greater than right lateral ventricles. Scattered remote microhemorrhages involving the bilateral cerebrum/cerebellum, brainstem and thalami. Confluent T2 hyperintense supratentorial white matter signal may reflect gliosis versus chronic microvascular ischemic changes. No significant midline shift. Multifocal chronic lacunar insults involving the right corona radiata/basal ganglia, pons and bilateral cerebellum. No mass lesion.  No extra-axial fluid collection. Vascular: Chronic right V4 segment high-grade narrowing. Chronic distal left M1 segment occlusion. T2 hyperintense signal within the  left petrous ICA may reflect slow flow versus high-grade narrowing. Skull and upper cervical spine: Normal marrow signal. Sinuses/Orbits: Normal orbits. Clear paranasal sinuses. No mastoid effusion. Other: None. IMPRESSION: No acute intracranial finding. 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 Multifocal chronic lacunar insults as detailed above. Chronic high-grade narrowing of the right V4 segment and distal left M1 segment occlusion. Left petrous ICA T2 hyperintense signal may reflect slow flow versus high-grade narrowing. These results were called by telephone at the time of interpretation on 04/20/2020 at 4:04 pm to provider [facility] , who verbally acknowledged these results. Electronically Signed   By: Stana Bunting M.D.   On: 04/20/2020 16:19   DG CHEST PORT 1 VIEW  Result Date: 04/20/2020 CLINICAL DATA:  Encephalopathy EXAM: PORTABLE CHEST 1 VIEW COMPARISON:  March 10, 2020 FINDINGS: Lungs are clear. Heart size and pulmonary vascularity are normal. No adenopathy. Loop recorder on left. There is aortic atherosclerosis. No bone lesions. IMPRESSION: Lungs clear.  Heart size normal.  Loop recorder on left. Aortic Atherosclerosis (ICD10-I70.0). Electronically Signed   By: Bretta Bang III M.D.   On: 04/20/2020 14:59   CT HEAD CODE STROKE WO CONTRAST  Result Date: 04/20/2020 CLINICAL DATA:  Code stroke.  Aphasia with left-sided weakness EXAM: CT HEAD WITHOUT CONTRAST TECHNIQUE: Contiguous axial images were obtained from the base of the skull through the vertex without intravenous contrast. COMPARISON:  02/23/2020 FINDINGS: Brain: Large remote left MCA and PCA territory infarct. No evidence of hemorrhage or acute infarct. Confluent low-density in the cerebral white matter attributed to chronic small vessel ischemia. Vascular: Atheromatous calcification. Prominent density at the right MCA bifurcation on axial slices but not clearly hyperdense on the reformats. Skull: No acute  finding. Sinuses/Orbits: Negative Other: These results were communicated to Dr Napoleon Form at 11:45 amon 12/20/2021by text page via the [facility] messaging system. ASPECTS [facility] Stroke Program Early CT Score) - Ganglionic level infarction (caudate, lentiform nuclei, internal capsule, insula, M1-M3 cortex): 7 - Supraganglionic infarction (M4-M6 cortex): 3 Total score (0-10 with 10 being normal): 10 IMPRESSION: 1. No acute finding. 2. Remote left cerebral infarction that only spares the ACA distribution. Electronically Signed   By: Marnee Spring M.D.   On: 04/20/2020 11:47   MR BRAIN WO CONTRAST  Final Result    DG CHEST PORT 1 VIEW  Final Result    CT HEAD CODE STROKE WO CONTRAST  Final Result    DG Swallowing Func-Speech Pathology    (Results Pending)    Scheduled Meds: .  stroke: mapping our early stages of recovery book   Does not apply Once  . amLODipine  10 mg Oral Daily  . aspirin EC  325 mg Oral q morning - 10a  . atorvastatin  40 mg Oral Q0600  . carvedilol  12.5 mg Oral BID WC  . clopidogrel  75 mg Oral Daily  . enoxaparin (  LOVENOX) injection  40 mg Subcutaneous Daily  . indapamide  1.25 mg Oral Daily  . insulin aspart  0-15 Units Subcutaneous TID WC  . lamoTRIgine  150 mg Oral BID  . lisinopril  20 mg Oral BID  . midodrine  5 mg Oral TID WC  . tamsulosin  0.4 mg Oral QPC supper   PRN Meds: acetaminophen **OR** acetaminophen (TYLENOL) oral liquid 160 mg/5 mL **OR** acetaminophen, traZODone Continuous Infusions: . sodium chloride 50 mL/hr at 04/21/20 1800     LOS: 0 days  Time spent: Greater than 50% of the 35 minute visit was spent in counseling/coordination of care for the patient as laid out in the A&P.   Lewie Chamberavid Kashtyn Jankowski, MD [facility] 04/21/2020, 6:21 PM

## 2020-04-21 NOTE — Assessment & Plan Note (Signed)
-  No new strokes seen on MRI brain -Large left MCA/PCA old strokes noted -Continue aspirin, Plavix, statin

## 2020-04-21 NOTE — Plan of Care (Signed)
Needs reinforcement due to the patient's current medical condition and altered mental status and disorientation.  Problem: Education: Goal: Knowledge of General Education information will improve Description: Including pain rating scale, medication(s)/side effects and non-pharmacologic comfort measures Outcome: Progressing   Problem: Health Behavior/Discharge Planning: Goal: Ability to manage health-related needs will improve Outcome: Progressing   Problem: Clinical Measurements: Goal: Ability to maintain clinical measurements within normal limits will improve Outcome: Progressing Goal: Will remain free from infection Outcome: Progressing Goal: Diagnostic test results will improve Outcome: Progressing Goal: Respiratory complications will improve Outcome: Progressing Goal: Cardiovascular complication will be avoided Outcome: Progressing   Problem: Activity: Goal: Risk for activity intolerance will decrease Outcome: Progressing   Problem: Nutrition: Goal: Adequate nutrition will be maintained Outcome: Progressing   Problem: Pain Managment: Goal: General experience of comfort will improve Outcome: Progressing   Problem: Safety: Goal: Ability to remain free from injury will improve Outcome: Progressing

## 2020-04-21 NOTE — Assessment & Plan Note (Signed)
Continue statin. 

## 2020-04-21 NOTE — Assessment & Plan Note (Addendum)
-  Slowing seen on EEG but no active seizure activity -Continue Lamictal 150 mg twice daily. If witnessed seizure, per neurology would then increase to 200 mg twice daily

## 2020-04-21 NOTE — ED Notes (Signed)
Pt daughter called Bailey Mech) for an update. Please call [phone]

## 2020-04-21 NOTE — Progress Notes (Signed)
Brief HPI: Douglas Beggs. is a 69 y.o. male with a history significant for multiple CVAs with right-sided deficits, type 2 diabetes, HLD, HTN, seizures on lamotrigine (last seizure August 2021 with increase home dose of lamotrigine), and renal insufficiency who presented to [facility] ED 12/20 as a Code Stroke with concerns for new/increased aphasia, drowsiness, trouble swallowing, and left-sided weakness.   - CT revealed stable chronic ischemic changes without acute intracranial process.  - MRI revealed Chronic high-grade narrowing of the right V4 segment and distal left M1 segment occlusion. Also, left petrous ICA T2 hyperintense signal may reflect slow flow versus high-grade narrowing. MRI also without acute intracranial process. - EEG 12/21: This study is suggestive ofcortical dysfunction in left hemisphere likely secondary to underlying infarct and can also be seen in post-ictal state.No seizures or epileptiform discharges were seen throughout the recording.  Subjective: On assessment today, Douglas [name] was sitting up in bed, feeding himself his lunch without assistance and without complication: no coughing or choking with swallowing. Daughter at bedside states he is almost back at his baseline mental and physical status with the exception of not consistently following commands. When asked repeatedly, Douglas [name] is able to squeeze and release my hands with his left hand. He does not stick out his tongue, squeeze his eyes shut on command. He needed assistance with opening his drink: per daughter, at home and at baseline he would have given the drink to her to open. Today, she asked him to hand her the drink for her to open and he did not follow this command. Repeats "yes" to all questions asked (baseline).  Daughter updated on stroke work-up results. She expressed the desire to have her father transferred to rehab because she states the last time he went to rehab (following his last admission s/p  seizure), he went to rehab and came home much stronger than he is now. She thinks he would benefit from further rehab assistance.   Exam: Vitals:   04/21/20 1029 04/21/20 1150  BP: 138/84 129/63  Pulse: 82 80  Resp:  18  Temp:    SpO2:  100%   Gen: In bed, eating (feeding himself), swallowing without choking or coughing.  Resp: non-labored breathing, no acute distress, symmetric chest rise with inspiration Abd: soft, nt, non-distended  Neuro: Mental Status: Patient is awake, alert. Unable to assess orientation. Answers "yes" to all questions (baseline per daughter at bedside) Patient is unable to give a clear and coherent history. Patient is globally aphasic from previous large left ischemic stroke.  Cranial Nerves: II: Visual Fields: no blink to threat in the right eye, blink to threat present on left eye.  III,IV, VI: EOMI. Gaze preference to the left, will cross midline with repeated attempts. V: Facial sensation- unable to be assessed due to baseline severe aphasia. Patient does not follow commands consistently. Will squeeze and release hand consistently on command but will not stick out tongue or close eyes to command.  VII: Facial movement is asymmetric with right sided mouth droop at rest. VIII: Unable to assess hearing X: Unable to assess not following commands XI: Shoulder shrug is unable to be assessed; not following commands. XII: Tongue movement unable to be assessed due to patient not following commands.  Motor: Tone is normal on the left extremities upper and lower. Contracture to the right upper extremity. 5/5 strength was present in left upper and left lower extremity. There is no movement in the right upper and lower extremities (baseline  from previous CVA) Sensory: Sensation is unable to be assessed due to severe global aphasia. Will respond to stimuli on the left side, RLE flaccid, RUE contracted. Deep Tendon Reflexes: 2+ and symmetric in the biceps, 2+  patellae. Plantars: Toes are downgoing bilaterally. Cerebellar: FNF and HKS are unable to be assessed; patient does not follow commands.  Pertinent Labs: Glucose 177  Impression:   Douglas [name] is a 69 year old male with a significant medical history and significant comorbid conditions (see HPI) who presented to [facility] ED 12/20 as a code stroke with family/EMS concern for new onset left-sided weakness, drowsiness, and trouble swallowing. A CT head and MRI were obtained revealing no acute intracranial process and demonstrating chronic stenosis and cerebrovascular disease. Since Douglas [name] was largely returned to baseline (more awake, reported resolution of left-sided weakness) on arrival to the ED with history of seizures and no acute intracranial process identified on imaging, an EEG was ordered to determine if his initial presentation was due to post-ictal status. EEG was performed 12/20 showing cortical dysfunction of the left hemisphere likely secondary to underlying infarct but may be attributable to a post-ictal state. No new seizures or epileptiform discharges were seen throughout the recording while in the hospital.   Recommendations: - EEG negative for seizures though may have some evidence of post-ictal status  - Without new seizure capture on EEG and resolution of presenting symptoms, continue home dose lamotrigine   - Speech cleared patient for diet, okay to take PO medication - No new acute stroke seen on scan. Continue aspirin, Plavix, statin  - Per speech recommendation: follow-up recommended in skilled nursing facility. Daughter requesting rehabilitation before transition home. Recommend discussing options with daughter and discussing goals of care and long-term goals.   Attestation:  I saw this patient with the APP on 04/21/20, obtained pertinent aspects of the history, and performed relevant physical and neurological examination as documented. Also, I reviewed the available  laboratory data and neuroimages, and other relevant tests/notes/procedures.  My examination findings include improved alertness. Eating a dysphagia diet with good appetite.   Impression: 1.  Suspect seizure as the basis for his stroke-like episode. 2.  No signs of new stroke, just the large LMCA/LPCA infarctions. 3.  Debility, dysphagia, global aphasia, difficulty with mobility -- unclear if family can take care of him at home without additional recovery, perhaps in an SNF.  Recommendations: 1.  Consider SNF disposition to maximize functional independence. 2.  Continue home LMT dose of 150 mg bid. If observed seizure activity occurs, increase to 200 mg bid. 3.  Needs follow up with neurology for suspected seizure. 4.  No change in stroke prevention regimen (see above).  Will be available on an as-needed basis.  Thank you.  Meredeth Ide, MD

## 2020-04-21 NOTE — Hospital Course (Signed)
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 His baseline is that he says "yes" to any question asked and has severe right-sided hemiplegia. However, he is able to follow some commands at home such as handing his daughter a drink for her to open or other simple gestures. There was concern for possible stroke on admission and neurology was also consulted. He underwent stroke work-up with CT head and MRI brain as well as EEG. No acute findings were seen on MRI, just old chronic and large left MCA/PCA infarcts.  EEG revealed cortical dysfunction in the left hemisphere with slowing noted which was considered to have possibly been associated with a post ictal state. No active seizure activity noted on EEG. Etiology of his presentation was considered likely due to a probable underlying seizure at home prior to admission but no acute stroke. He was recommended by neurology to continue on previous regimen of seizure medication and secondary stroke prevention medications. He was also evaluated by physical therapy and his daughter was hopeful for patient going to rehab at discharge as she states that he regained some function and came home stronger after rehab.

## 2020-04-21 NOTE — Evaluation (Signed)
Occupational Therapy Evaluation Patient Details Name: Douglas [name]. MRN: [id number] DOB: 08-03-1950 Today's Date: 04/21/2020    History of Present Illness 69 yo male presenting with acute metabolic encephalopathy with concern for seizure vs. CVA. Previous hospital admission 10/2019 with AMS and concern for seizure vs. underlying CVA. 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   Clinical Impression   Patient with unable to provide PLOF or home set-up with information obtained from daughter via phone call. PTA patient was living in a single-level private residence with his spouse and daughter and was requiring +1 assist for ADLs and ADL transfers with use of hemi-walker. Patient was bathing seated on a TTB in a walk-in shower and was able to bathe UB with set-up to Min A. Assist required for LB bathing/dressing. Daughter also reports that patient was following 1-step verbal commands. Patient currently functioning below baseline requiring Max A to Total A for bed mobility, +2 assist for functional transfers, and Min to Mod A to maintain static sitting balance with unilateral UE support at EOB. Patient also with contractures in RUE at shoulder, elbow, wrist and digits. Towel roll placed in palm to maintain skin integrity. Patient would benefit from continued acute OT services to maximize safety and independence with self-care tasks in prep for d/c to next level of care. Daughter in agreement with recommendation for SNF rehab.     Follow Up Recommendations  SNF    Equipment Recommendations  None recommended by OT    Recommendations for Other Services       Precautions / Restrictions Precautions Precautions: Fall Restrictions Weight Bearing Restrictions: No      Mobility Bed Mobility Overal bed mobility: Needs Assistance Bed Mobility: Rolling;Sidelying to Sit;Sit to Supine Rolling: Mod assist;Total assist Sidelying to sit: Max  assist   Sit to supine: Total assist   General bed mobility comments: Mod A for rolling R and Total A for rolling L with hand over hand assist for hand placement.    Transfers Overall transfer level: Needs assistance               General transfer comment: Deferred. Patient requires +2 assist.    Balance Overall balance assessment: Needs assistance Sitting-balance support: Single extremity supported;Feet supported Sitting balance-Leahy Scale: Poor Sitting balance - Comments: Mod A to maintain sitting balance at EOB with LUE supported on bedrail. Patient with posterior and L lateral lean. Postural control: Posterior lean;Left lateral lean                                 ADL either performed or assessed with clinical judgement   ADL Overall ADL's : Needs assistance/impaired     Grooming: Moderate assistance Grooming Details (indicate cue type and reason): Mod A to wash face. Patient direct gaze to R to find washcloth. Only able to wash face after OT placed washcloth in L hand.         Upper Body Dressing : Maximal assistance;Bed level   Lower Body Dressing: Total assistance;Bed level       Toileting- Clothing Manipulation and Hygiene: Total assistance;Bed level               Vision Patient Visual Report:  (R visual field cut from previous CVA.) Vision Assessment?: Vision impaired- to be further tested in functional context     Perception     Praxis  Praxis Praxis tested?: Deficits Deficits: Initiation;Ideomotor;Perseveration    Pertinent Vitals/Pain Pain Assessment: Faces Faces Pain Scale: No hurt     Hand Dominance Right (Uses L hand 2/2 CVA with residual R sided weakness.)   Extremity/Trunk Assessment Upper Extremity Assessment Upper Extremity Assessment: RUE deficits/detail RUE Deficits / Details: Residual R hemiplegia from previous CVA with contractures at shoulder, elbow, wrist and digits. RUE Sensation:  (Unable to assess 2/2  communication deficits.) RUE Coordination: decreased fine motor;decreased gross motor   Lower Extremity Assessment Lower Extremity Assessment: Defer to PT evaluation       Communication Communication Communication: Expressive difficulties;Receptive difficulties   Cognition Arousal/Alertness: Awake/alert Behavior During Therapy: Flat affect Overall Cognitive Status: No family/caregiver present to determine baseline cognitive functioning                                 General Comments: Daughter states that patient was able to follow verbal commands at baseline.   General Comments  Patient unable to accuratley answer "yes, no" questions or scan visually beyond midline on R.    Exercises     Shoulder Instructions      Home Living Family/patient expects to be discharged to:: Private residence Living Arrangements: Children;Spouse/significant other Available Help at Discharge: Family;Available 24 hours/day;Other (Comment);Personal care attendant Type of Home: House Home Access: Level entry     Home Layout: One level     Bathroom Shower/Tub: Producer, television/film/video: Standard (BSC over top)     Home Equipment: Tub bench;Wheelchair - [employer];Hospital bed;Transport chair;Hand held shower head;Grab bars - tub/shower (hemi walker)          Prior Functioning/Environment Level of Independence: Needs assistance  Gait / Transfers Assistance Needed: Assist for ADL transfers including toilet transfers and walk-in shower transfers with use of hemi walker. Patient also able to self-propel standard wc in home. Use of manual wc vs transport chair in community dwellings. ADL's / Homemaking Assistance Needed: Assist with all ADLs at baseline. Could bathe UB with set-up to Min A. Communication / Swallowing Assistance Needed: Aphasic at baseline. Can say a few words including yes, no and wow. Comments: PLOF obtained from daughter via phone call.         OT Problem List: Decreased strength;Decreased range of motion;Impaired balance (sitting and/or standing);Impaired vision/perception;Decreased coordination;Decreased cognition;Decreased safety awareness;Impaired tone;Impaired UE functional use      OT Treatment/Interventions: Self-care/ADL training;Therapeutic exercise;Neuromuscular education;DME and/or AE instruction;Splinting;Therapeutic activities;Visual/perceptual remediation/compensation;Patient/family education;Balance training    OT Goals(Current goals can be found in the care plan section) Acute Rehab OT Goals Patient Stated Goal: Unable OT Goal Formulation: Patient unable to participate in goal setting Time For Goal Achievement: 05/05/20 Potential to Achieve Goals: Fair ADL Goals Pt Will Perform Eating: with min assist Pt Will Perform Grooming: with min assist;sitting Pt Will Perform Upper Body Dressing: with min assist;sitting Pt Will Perform Lower Body Dressing: with mod assist;sit to/from stand Additional ADL Goal #1: Patient will maintain static sitting balance at EOB with unilateral UE support and supervision A.  OT Frequency: Min 2X/week   Barriers to D/C:            Co-evaluation              AM-PAC OT "6 Clicks" Daily Activity     Outcome Measure Help from another person eating meals?: A Lot Help from another person taking care of personal grooming?: A Lot Help from  another person toileting, which includes using toliet, bedpan, or urinal?: Total Help from another person bathing (including washing, rinsing, drying)?: Total Help from another person to put on and taking off regular upper body clothing?: A Lot Help from another person to put on and taking off regular lower body clothing?: Total 6 Click Score: 9   End of Session Nurse Communication: Mobility status  Activity Tolerance: Patient tolerated treatment well Patient left: in bed;with call bell/phone within reach;with bed alarm set  OT Visit  Diagnosis: Other abnormalities of gait and mobility (R26.89);Muscle weakness (generalized) (M62.81);Ataxia, unspecified (R27.0);Other symptoms and signs involving the nervous system (R29.898);Cognitive communication deficit (R41.841);Hemiplegia and hemiparesis Symptoms and signs involving cognitive functions: Cerebral infarction Hemiplegia - Right/Left: Right Hemiplegia - dominant/non-dominant: Dominant Hemiplegia - caused by: Cerebral infarction                Time: 9937-1696 OT Time Calculation (min): 36 min Charges:  OT General Charges $OT Visit: 1 Visit OT Evaluation $OT Eval Moderate Complexity: 1 Mod OT Treatments $Therapeutic Activity: 8-22 mins  Shulamit Donofrio H. OTR/L Supplemental OT, Department of rehab services [phone]  Shellee Streng R H. 04/21/2020, 3:02 PM

## 2020-04-21 NOTE — Assessment & Plan Note (Signed)
-   Continue Flomax 

## 2020-04-21 NOTE — Procedures (Signed)
Patient Name: Douglas [name].  MRN: [id number]  Epilepsy Attending: Charlsie Quest  Referring Physician/Provider: Dr. Madelyn Flavors Date: 04/20/2020 Duration: 25.34 minutes  Patient history: 69 year old male with history of multiple CVAs with right-sided deficits, seizures on lamotrigine who presented with altered mental status, aphasia, left-sided weakness. EEG to evaluate for seizures.  Level of alertness: Awake, drowsy, sleep, comatose, lethargic  AEDs during EEG study: Lamotrigine  Technical aspects: This EEG study was done with scalp electrodes positioned according to the 10-20 International system of electrode placement. Electrical activity was acquired at a sampling rate of 500Hz  and reviewed with a high frequency filter of 70Hz  and a low frequency filter of 1Hz . EEG data were recorded continuously and digitally stored.   Description: The posterior dominant rhythm consists of 8 Hz activity of moderate voltage (25-35 uV) seen predominantly in posterior head regions, asymmetric(L<R) and reactive to eye opening and eye closing. EEG showed continuous left hemispheric 3 to 6 Hz theta-delta slowing.  Hyperventilation and photic stimulation were not performed.     ABNORMALITY - Continuous slow, left hemisphere - Background asymmetry, left<right  IMPRESSION: This study is suggestive of cortical dysfunction in left hemisphere likely secondary to underlying infarct and can also be seen in post-ictal state. No seizures or epileptiform discharges were seen throughout the recording.  Douglas [name] 

## 2020-04-22 ENCOUNTER — Telehealth: Payer: Self-pay

## 2020-04-22 ENCOUNTER — Inpatient Hospital Stay (HOSPITAL_COMMUNITY): Payer: Medicare Other

## 2020-04-22 DIAGNOSIS — R5381 Other malaise: Secondary | ICD-10-CM

## 2020-04-22 LAB — CBC WITH DIFFERENTIAL/PLATELET
Abs Immature Granulocytes: 0.03 10*3/uL (ref 0.00–0.07)
Basophils Absolute: 0 10*3/uL (ref 0.0–0.1)
Basophils Relative: 1 %
Eosinophils Absolute: 0.1 10*3/uL (ref 0.0–0.5)
Eosinophils Relative: 1 %
HCT: 39.3 % (ref 39.0–52.0)
Hemoglobin: 12.5 g/dL — ABNORMAL LOW (ref 13.0–17.0)
Immature Granulocytes: 0 %
Lymphocytes Relative: 20 %
Lymphs Abs: 1.4 10*3/uL (ref 0.7–4.0)
MCH: 29.4 pg (ref 26.0–34.0)
MCHC: 31.8 g/dL (ref 30.0–36.0)
MCV: 92.5 fL (ref 80.0–100.0)
Monocytes Absolute: 0.6 10*3/uL (ref 0.1–1.0)
Monocytes Relative: 9 %
Neutro Abs: 4.8 10*3/uL (ref 1.7–7.7)
Neutrophils Relative %: 69 %
Platelets: 234 10*3/uL (ref 150–400)
RBC: 4.25 MIL/uL (ref 4.22–5.81)
RDW: 13.6 % (ref 11.5–15.5)
WBC: 7 10*3/uL (ref 4.0–10.5)
nRBC: 0 % (ref 0.0–0.2)

## 2020-04-22 LAB — LIPID PANEL
Cholesterol: 101 mg/dL (ref 0–200)
HDL: 34 mg/dL — ABNORMAL LOW (ref >40)
LDL Cholesterol: 59 mg/dL (ref 0–99)
Total CHOL/HDL Ratio: 3 RATIO
Triglycerides: 40 mg/dL (ref <150)
VLDL: 8 mg/dL (ref 0–40)

## 2020-04-22 LAB — GLUCOSE, CAPILLARY
Glucose-Capillary: 172 mg/dL — ABNORMAL HIGH (ref 70–99)
Glucose-Capillary: 187 mg/dL — ABNORMAL HIGH (ref 70–99)
Glucose-Capillary: 228 mg/dL — ABNORMAL HIGH (ref 70–99)
Glucose-Capillary: 109 mg/dL — ABNORMAL HIGH (ref 70–99)
Glucose-Capillary: 313 mg/dL — ABNORMAL HIGH (ref 70–99)

## 2020-04-22 LAB — BASIC METABOLIC PANEL
Anion gap: 13 (ref 5–15)
BUN: 10 mg/dL (ref 8–23)
CO2: 23 mmol/L (ref 22–32)
Calcium: 9.1 mg/dL (ref 8.9–10.3)
Chloride: 101 mmol/L (ref 98–111)
Creatinine, Ser: 0.98 mg/dL (ref 0.61–1.24)
GFR, Estimated: >60 mL/min (ref >60)
Glucose, Bld: 246 mg/dL — ABNORMAL HIGH (ref 70–99)
Potassium: 3.7 mmol/L (ref 3.5–5.1)
Sodium: 137 mmol/L (ref 135–145)

## 2020-04-22 LAB — MAGNESIUM: Magnesium: 1.7 mg/dL (ref 1.7–2.4)

## 2020-04-22 LAB — HEMOGLOBIN A1C
Hgb A1c MFr Bld: 6.6 % — ABNORMAL HIGH (ref 4.8–5.6)
Mean Plasma Glucose: 142.72 mg/dL

## 2020-04-22 LAB — TSH: TSH: 1.356 u[IU]/mL (ref 0.350–4.500)

## 2020-04-22 MED ORDER — INSULIN ASPART 100 UNIT/ML ~~LOC~~ SOLN
0.0000 [IU] | Freq: Three times a day (TID) | SUBCUTANEOUS | Status: DC
  Administered 2020-04-22: 11 [IU] via SUBCUTANEOUS
  Administered 2020-04-23: 3 [IU] via SUBCUTANEOUS
  Administered 2020-04-23 (×2): 11 [IU] via SUBCUTANEOUS
  Administered 2020-04-23: 8 [IU] via SUBCUTANEOUS
  Administered 2020-04-24: 3 [IU] via SUBCUTANEOUS
  Administered 2020-04-24: 2 [IU] via SUBCUTANEOUS
  Administered 2020-04-24: 3 [IU] via SUBCUTANEOUS
  Administered 2020-04-24: 5 [IU] via SUBCUTANEOUS
  Administered 2020-04-25: 8 [IU] via SUBCUTANEOUS
  Administered 2020-04-25 – 2020-04-26 (×3): 5 [IU] via SUBCUTANEOUS
  Administered 2020-04-26: 8 [IU] via SUBCUTANEOUS
  Administered 2020-04-26: 3 [IU] via SUBCUTANEOUS
  Administered 2020-04-27: 5 [IU] via SUBCUTANEOUS
  Administered 2020-04-27: 2 [IU] via SUBCUTANEOUS

## 2020-04-22 MED ORDER — ADULT MULTIVITAMIN W/MINERALS CH
1.0000 | ORAL_TABLET | Freq: Every day | ORAL | Status: DC
  Administered 2020-04-22 – 2020-04-27 (×6): 1 via ORAL
  Filled 2020-04-22 (×6): qty 1

## 2020-04-22 MED ORDER — COVID-19 MRNA VACCINE (PFIZER) 30 MCG/0.3ML IM SUSP
0.3000 mL | Freq: Once | INTRAMUSCULAR | Status: AC
  Administered 2020-04-22: 0.3 mL via INTRAMUSCULAR
  Filled 2020-04-22: qty 0.3

## 2020-04-22 MED ORDER — ALUM & MAG HYDROXIDE-SIMETH 200-200-20 MG/5ML PO SUSP
30.0000 mL | Freq: Once | ORAL | Status: AC
  Administered 2020-04-22: 30 mL via ORAL
  Filled 2020-04-22: qty 30

## 2020-04-22 MED ORDER — HYDRALAZINE HCL 20 MG/ML IJ SOLN
5.0000 mg | INTRAMUSCULAR | Status: DC | PRN
  Administered 2020-04-22: 5 mg via INTRAVENOUS
  Filled 2020-04-22 (×2): qty 1

## 2020-04-22 NOTE — Plan of Care (Signed)

## 2020-04-22 NOTE — Progress Notes (Signed)
Initial Nutrition Assessment  DOCUMENTATION CODES:   Severe malnutrition in context of chronic illness  INTERVENTION:   Change pt to inappropriate for room service  Staff to assist pt with all meals as needed  Vital Cuisine shake po TID, each supplement provide 520 kcals and 22 grams of protein  Magic cup TID with meals, each supplement provides 290 kcal and 9 grams of protein  MVI with minerals daily   NUTRITION DIAGNOSIS:   Severe Malnutrition related to chronic illness (h/o multiple CVAs w/ residual R-sided deficits) as evidenced by severe muscle depletion,severe fat depletion.   GOAL:   Patient will meet greater than or equal to 90% of their needs    MONITOR:   PO intake,Supplement acceptance,Skin,Weight trends,Labs,I & O's  REASON FOR ASSESSMENT:   Malnutrition Screening Tool    ASSESSMENT:   Pt admitted with acute metabolic encephalopathy. PMH includes multiple CVAs with residual R-sided deficits, type 2 DM, HLD, HTN, seizure disorder, and CKD.  Pt sleeping at time of RD visit and did not wake to RD voice/touch. Per MD, pt is mostly back to baseline mentation (saying "yes" to all questions) and acute metabolic encephalopathy (now resolved) was likely postictal from seizure.  Pt noted to have been receiving 24/7 care within a private residence PTA. PT recommending consideration of SNF.   Reviewed weight history. Pt noted to have weighed 88kg in March 2021 and 78kg April-July 2021. Pt then documented to have weighed 77.6 kg in August and November of this year. Pt with 2 weights from this admission -- 72 kg and 74.6 kg. Unsure which of these weights is accurate at this time and unable to verify with pt. If all weights are accurate (including the most recent wt reading of 72 kg), pt noted to have had potential 18% wt loss x9 months and 7% x 1 month, both of which are significant for time frame.   PO intake: 100% x 1 recorded meal  UOP: documented x24  hours  Labs: CBGs 172-228 Medications: lozol, ss novolog TID w/ meals   NUTRITION - FOCUSED PHYSICAL EXAM:  Flowsheet Row Most Recent Value  Orbital Region Moderate depletion  Upper Arm Region Severe depletion  Thoracic and Lumbar Region Severe depletion  Buccal Region Moderate depletion  Temple Region Severe depletion  Clavicle Bone Region Severe depletion  Clavicle and Acromion Bone Region Severe depletion  Scapular Bone Region Severe depletion  Dorsal Hand Moderate depletion  Patellar Region Severe depletion  Anterior Thigh Region Severe depletion  Posterior Calf Region Severe depletion  Edema (RD Assessment) None  Hair Reviewed  Eyes Reviewed  Mouth Reviewed  Skin Reviewed  Nails Reviewed       Diet Order:   Diet Order            DIET - DYS 1 Room service appropriate? Yes; Fluid consistency: Nectar Thick  Diet effective now                 EDUCATION NEEDS:   Not appropriate for education at this time  Skin:  Skin Assessment: Skin Integrity Issues: Skin Integrity Issues:: Stage II Stage II: sacrum  Last BM:  12/21  Height:   Ht Readings from Last 1 Encounters:  04/20/20 6\' 1"  (1.854 m)    Weight:   Wt Readings from Last 1 Encounters:  04/20/20 72 kg   BMI:  Body mass index is 20.95 kg/m.  Estimated Nutritional Needs:   Kcal:  2000-2200  Protein:  100-115 grams  Fluid:  >2L/d    Larkin Ina, MS, RD, LDN RD pager number and weekend/on-call pager number located in [place].

## 2020-04-22 NOTE — Telephone Encounter (Signed)
Below is Dinah's response that was sent in a routing message which is not a permanent part of patients chart:   Thanks.I received patient's progress notes updates from the hospital.   Routing comment

## 2020-04-22 NOTE — Assessment & Plan Note (Addendum)
-  Patient is from private residence with 24/7 care. PT recommending to consider SNF - bed accepted at [facility]

## 2020-04-22 NOTE — TOC Initial Note (Addendum)
Transition of Care (TOC) - Initial/Assessment Note    Patient Details  Name: Douglas [name]. MRN: [id number] Date of Birth: Mar 09, 1951  Transition of Care [facility]) CM/SW Contact:    Levada Schilling Phone Number: 04/22/2020, 2:34 PM  Clinical Narrative:                 CSW spoke with pt's daughter Cala Bradford by  [employer].  CSW introduce role and explained PT recommendations for possible  SNF placement.  [name] is agreeable to SNF placement.  and CSW to seek possible SNF placement in [place].  [name] reports pt has lived with daughter for 1 year in a single family home  prior to moving from [place].  Pt lived in an independent living facility in [place]. [place], [place] for 2 years before moving to [place].  [name] would like for pt to receive 2 dosage of moderna.  Pt received 1st dose in August. TOC Team will continue to assist with disposition planning.   Expected Discharge Plan: Skilled Nursing Facility Barriers to Discharge: Continued Medical Work up,SNF Pending bed offer,Insurance Authorization   Patient Goals and CMS Choice Patient states their goals for this hospitalization and ongoing recovery are:: Per pt's daughter " for him to get stronger and not choke and to be able to get out more words". CMS Medicare.gov Compare Post Acute Care list provided to:: Patient Represenative (must comment) (Medicare.gov website was given to pt's daughter by phone) Choice offered to / list presented to : Adult Children  Expected Discharge Plan and Services Expected Discharge Plan: Skilled Nursing Facility In-house Referral: Clinical Social Work     Living arrangements for the past 2 months: Single Family Home                                      Prior Living Arrangements/Services Living arrangements for the past 2 months: Single Family Home Lives with:: Adult Children Patient language and need for interpreter reviewed:: No Do you feel safe going back to the  place where you live?: Yes      Need for Family Participation in Patient Care: Yes (Comment) Care giver support system in place?: Yes (comment)   Criminal Activity/Legal Involvement Pertinent to Current Situation/Hospitalization: No - Comment as needed  Activities of Daily Living Home Assistive Devices/Equipment: Other (Comment) (Pt unable to express home devices used) ADL Screening (condition at time of admission) Patient's cognitive ability adequate to safely complete daily activities?: No Is the patient deaf or have difficulty hearing?: No Does the patient have difficulty seeing, even when wearing glasses/contacts?: No Does the patient have difficulty concentrating, remembering, or making decisions?: Yes Patient able to express need for assistance with ADLs?: No Does the patient have difficulty dressing or bathing?: Yes Independently performs ADLs?: No Communication: Needs assistance Is this a change from baseline?: Change from baseline, expected to last >3 days Dressing (OT): Needs assistance Is this a change from baseline?: Change from baseline, expected to last >3 days Grooming: Needs assistance Is this a change from baseline?: Change from baseline, expected to last >3 days Feeding: Independent Is this a change from baseline?: Pre-admission baseline Bathing: Needs assistance Is this a change from baseline?: Change from baseline, expected to last >3 days Toileting: Needs assistance Is this a change from baseline?: Change from baseline, expected to last >3days In/Out Bed: Needs assistance Is this a change from baseline?:  Change from baseline, expected to last >3 days Walks in Home: Needs assistance Is this a change from baseline?: Change from baseline, expected to last >3 days Does the patient have difficulty walking or climbing stairs?: Yes Weakness of Legs: Both Weakness of Arms/Hands: Both (Affected extremitiy Right side)  Permission Sought/Granted Permission sought to  share information with : Case Manager,Facility Contact Representative,Family Supports Permission granted to share information with : Yes, Verbal Permission Granted  Share Information with NAME: Detrick Dani  Permission granted to share info w AGENCY: Yes  Permission granted to share info w Relationship: Daughter  Permission granted to share info w Contact Information: yes  Emotional Assessment Appearance:: Appears stated age Attitude/Demeanor/Rapport: Unable to Assess (difficult to assess due to expressive/receptive asphasia) Affect (typically observed): Unable to Assess (Difficult to assess due to expressive/receptive asphasia) Orientation: : Oriented to Self,Oriented to Place Alcohol / Substance Use: Not Applicable Psych Involvement: No (comment)  Admission diagnosis:  Acute encephalopathy [G93.40] Altered mental status, unspecified altered mental status type [R41.82] Acute metabolic encephalopathy [G93.41] Patient Active Problem List   Diagnosis Date Noted  . Physical debility 04/22/2020  . Hyperlipidemia 04/20/2020  . Slow transit constipation 12/26/2019  . Seizure (HCC) 11/20/2019  . Seizures (HCC) 11/20/2019  . Encephalopathy 11/19/2019  . Delayed orthostatic hypotension 08/04/2019  . Seizure disorder (HCC) 07/21/2019  . 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 Shoulder subluxation, right, sequela 07/16/2019  . Altered mental status 07/15/2019  . 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 Late effects of CVA (cerebrovascular accident)   . Pressure injury of skin 06/08/2019  . History of loop recorder 03/08/2019  . UTI (urinary tract infection) 03/05/2019  . 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 Global aphasia   . Type 2 diabetes mellitus with peripheral neuropathy (HCC)   . Hypertension associated with diabetes (HCC)   . Hyperlipidemia associated with type 2 diabetes mellitus (HCC)    . History of CVA (cerebrovascular accident)   . History of Dysphagia, post-stroke    PCP:  Ngetich, Donalee Citrin, NP Pharmacy:   [facility] [phone] - [place], [place] - [address] AT [address] OF [facility] [address] [place] [address] Phone: [phone] Fax: [phone]     Social Determinants of Health (SDOH) Interventions    Readmission Risk Interventions No flowsheet data found.

## 2020-04-22 NOTE — Plan of Care (Signed)
  Problem: Safety: Goal: Ability to remain free from injury will improve Outcome: Progressing   Problem: Health Behavior/Discharge Planning: Goal: Ability to manage health-related needs will improve Outcome: Progressing   Problem: Education: Goal: Knowledge of General Education information will improve Description: Including pain rating scale, medication(s)/side effects and non-pharmacologic comfort measures Outcome: Progressing

## 2020-04-22 NOTE — Progress Notes (Signed)
PROGRESS NOTE    Douglas CordiaWilliam L Orosz Jr.   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 His baseline is that he says "yes" to any question asked and has severe right-sided hemiplegia. However, he is able to follow some commands at home such as handing his daughter a drink for her to open or other simple gestures. There was concern for possible stroke on admission and neurology was also consulted. He underwent stroke work-up with CT head and MRI brain as well as EEG. No acute findings were seen on MRI, just old chronic and large left MCA/PCA infarcts.  EEG revealed cortical dysfunction in the left hemisphere with slowing noted which was considered to have possibly been associated with a post ictal state. No active seizure activity noted on EEG. Etiology of his presentation was considered likely due to a probable underlying seizure at home prior to admission but no acute stroke. He was recommended by neurology to continue on previous regimen of seizure medication and secondary stroke prevention medications. He was also evaluated by physical therapy and his daughter was hopeful for patient going to rehab at discharge as she states that he regained some function and came home stronger after rehab.   Interval History:  No events overnight. Resting in bed comfortably this morning. Arouses easily and tries to interact. Seems to be mostly back to baseline.  Old records reviewed in assessment of this patient  ROS: Review of systems not obtained due to patient factors. Cognitive impairment  Assessment & Plan: * Acute metabolic encephalopathy-resolved as of 04/22/2020 -Likely postictal from seizure. No active seizure on EEG -Neurology  has seen patient -Continue current seizure regimen -Baseline mentation is answering "yes" to all questions which he is perceivably back to baseline at this point  Seizure disorder (HCC) -Slowing seen on EEG but no active seizure activity -Continue Lamictal 150 mg twice daily. If witnessed seizure, per neurology would then increase to 200 mg twice daily  History of CVA (cerebrovascular accident) -No new strokes seen on MRI brain -Large left MCA/PCA old strokes noted -Continue aspirin, Plavix, statin  Physical debility -Patient is from private residence with 24/7 care. PT recommending to consider SNF -We will follow-up with SW/CM and family discussions  Hyperlipidemia -Continue statin  BPH with obstruction/lower urinary tract symptoms -Continue Flomax  Type 2 diabetes mellitus with peripheral neuropathy (HCC) -Continue SSI and CBG monitoring -Last A1c 6.5% on 03/06/2020 -Levemir on hold   Antimicrobials:   DVT prophylaxis: Lovenox Code Status: Full Family Communication: None present Disposition Plan: Status is: Inpatient  Remains inpatient appropriate because:Altered mental status, Unsafe d/c plan and Inpatient level of care appropriate due to severity of illness   Dispo: The patient is from: Home              Anticipated d/c is to: SNF              Anticipated d/c date is: > 3 days              Patient currently is not medically stable to d/c.  Objective: Blood pressure 138/82, pulse 73, temperature 97.6 F (36.4 C), temperature source Oral, resp. rate 16, height 6\' 1"  (1.854 m), weight 72 kg,  SpO2 100 %.  Examination: General appearance: Chronically ill elderly man appearing older than stated age lying in bed in no obvious distress answering "yes" to all questions and he is not following any commands Head: Normocephalic, without obvious abnormality, atraumatic Eyes: EOMI Lungs: clear to auscultation bilaterally Heart: regular rate and rhythm and S1, S2  normal Abdomen: normal findings: bowel sounds normal and soft, non-tender Extremities: Contracted right wrist. No edema Skin: mobility and turgor normal Neurologic: Answers "yes" to all questions. Does not follow any commands. Right-sided hemiplegia noted  Consultants:   Neurology  Procedures:   EEG  Data Reviewed: I have personally reviewed following labs and imaging studies Results for orders placed or performed during the hospital encounter of 04/20/20 (from the past 24 hour(s))  Glucose, capillary     Status: Abnormal   Collection Time: 04/21/20  4:31 PM  Result Value Ref Range   Glucose-Capillary 162 (H) 70 - 99 mg/dL  Glucose, capillary     Status: Abnormal   Collection Time: 04/21/20  9:03 PM  Result Value Ref Range   Glucose-Capillary 160 (H) 70 - 99 mg/dL  Lipid panel     Status: Abnormal   Collection Time: 04/22/20  2:49 AM  Result Value Ref Range   Cholesterol 101 0 - 200 mg/dL   Triglycerides 40 <956 mg/dL   HDL 34 (L) >21 mg/dL   Total CHOL/HDL Ratio 3.0 RATIO   VLDL 8 0 - 40 mg/dL   LDL Cholesterol 59 0 - 99 mg/dL  Basic metabolic panel     Status: Abnormal   Collection Time: 04/22/20  2:49 AM  Result Value Ref Range   Sodium 137 135 - 145 mmol/L   Potassium 3.7 3.5 - 5.1 mmol/L   Chloride 101 98 - 111 mmol/L   CO2 23 22 - 32 mmol/L   Glucose, Bld 246 (H) 70 - 99 mg/dL   BUN 10 8 - 23 mg/dL   Creatinine, Ser 3.08 0.61 - 1.24 mg/dL   Calcium 9.1 8.9 - 65.7 mg/dL   GFR, Estimated >84 >69 mL/min   Anion gap 13 5 - 15  CBC with Differential/Platelet     Status: Abnormal   Collection Time: 04/22/20  2:49 AM  Result Value Ref Range   WBC 7.0 4.0 - 10.5 K/uL   RBC 4.25 4.22 - 5.81 MIL/uL   Hemoglobin 12.5 (L) 13.0 - 17.0 g/dL   HCT 62.9 52.8 - 41.3 %   MCV 92.5 80.0 - 100.0 fL   MCH 29.4 26.0 - 34.0 pg   MCHC 31.8 30.0 - 36.0 g/dL   RDW 24.4 01.0 - 27.2 %   Platelets 234 150 - 400 K/uL   nRBC 0.0 0.0 - 0.2 %   Neutrophils Relative % 69 %   Neutro Abs  4.8 1.7 - 7.7 K/uL   Lymphocytes Relative 20 %   Lymphs Abs 1.4 0.7 - 4.0 K/uL   Monocytes Relative 9 %   Monocytes Absolute 0.6 0.1 - 1.0 K/uL   Eosinophils Relative 1 %   Eosinophils Absolute 0.1 0.0 - 0.5 K/uL   Basophils Relative 1 %   Basophils Absolute 0.0 0.0 - 0.1 K/uL   Immature Granulocytes 0 %   Abs Immature Granulocytes 0.03 0.00 - 0.07 K/uL  Magnesium     Status: None   Collection Time: 04/22/20  2:49 AM  Result Value Ref Range   Magnesium 1.7 1.7 - 2.4 mg/dL  Hemoglobin Z3G     Status: Abnormal  Collection Time: 04/22/20  2:49 AM  Result Value Ref Range   Hgb A1c MFr Bld 6.6 (H) 4.8 - 5.6 %   Mean Plasma Glucose 142.72 mg/dL  TSH     Status: None   Collection Time: 04/22/20  2:49 AM  Result Value Ref Range   TSH 1.356 0.350 - 4.500 uIU/mL  Glucose, capillary     Status: Abnormal   Collection Time: 04/22/20  7:01 AM  Result Value Ref Range   Glucose-Capillary 172 (H) 70 - 99 mg/dL  Glucose, capillary     Status: Abnormal   Collection Time: 04/22/20  8:29 AM  Result Value Ref Range   Glucose-Capillary 187 (H) 70 - 99 mg/dL   Comment 1 Notify RN    Comment 2 Document in Chart     Recent Results (from the past 240 hour(s))  Resp Panel by RT-PCR (Flu A&B, Covid) Nasopharyngeal Swab     Status: None   Collection Time: 04/20/20 12:28 PM   Specimen: Nasopharyngeal Swab; Nasopharyngeal(NP) swabs in vial transport medium  Result Value Ref Range Status   SARS Coronavirus 2 by RT PCR NEGATIVE NEGATIVE Final    Comment: (NOTE) SARS-CoV-2 target nucleic acids are NOT DETECTED.  The SARS-CoV-2 RNA is generally detectable in upper respiratory specimens during the acute phase of infection. The lowest concentration of SARS-CoV-2 viral copies this assay can detect is 138 copies/mL. A negative result does not preclude SARS-Cov-2 infection and should not be used as the sole basis for treatment or other patient management decisions. A negative result may occur with   improper specimen collection/handling, submission of specimen other than nasopharyngeal swab, presence of viral mutation(s) within the areas targeted by this assay, and inadequate number of viral copies(<138 copies/mL). A negative result must be combined with clinical observations, patient history, and epidemiological information. The expected result is Negative.  Fact Sheet for Patients:  BloggerCourse.com  Fact Sheet for Healthcare Providers:  SeriousBroker.it  This test is no t yet approved or cleared by the [place] FDA and  has been authorized for detection and/or diagnosis of SARS-CoV-2 by FDA under an Emergency Use Authorization (EUA). This EUA will remain  in effect (meaning this test can be used) for the duration of the COVID-19 declaration under Section 564(b)(1) of the Act, 21 U.S.C.section 360bbb-3(b)(1), unless the authorization is terminated  or revoked sooner.       Influenza A by PCR NEGATIVE NEGATIVE Final   Influenza B by PCR NEGATIVE NEGATIVE Final    Comment: (NOTE) The Xpert Xpress SARS-CoV-2/FLU/RSV plus assay is intended as an aid in the diagnosis of influenza from Nasopharyngeal swab specimens and should not be used as a sole basis for treatment. Nasal washings and aspirates are unacceptable for Xpert Xpress SARS-CoV-2/FLU/RSV testing.  Fact Sheet for Patients: BloggerCourse.com  Fact Sheet for Healthcare Providers: SeriousBroker.it  This test is not yet approved or cleared by the [place] FDA and has been authorized for detection and/or diagnosis of SARS-CoV-2 by FDA under an Emergency Use Authorization (EUA). This EUA will remain in effect (meaning this test can be used) for the duration of the COVID-19 declaration under Section 564(b)(1) of the Act, 21 U.S.C. section 360bbb-3(b)(1), unless the authorization is terminated  or revoked.  Performed at [facility], [address] [place], [place] [address]      Radiology Studies: EEG  Result Date: 04/21/2020 Charlsie Quest, MD     04/21/2020  8:47 AM Patient Name: CARROL BONDAR  Montez Hageman MRN: [id number] Epilepsy Attending: Charlsie Quest Referring Physician/Provider: Dr. Madelyn Flavors Date: 04/20/2020 Duration: 25.34 minutes Patient history: 69 year old male with history of multiple CVAs with right-sided deficits, seizures on lamotrigine who presented with altered mental status, aphasia, left-sided weakness. EEG to evaluate for seizures. Level of alertness: Awake, drowsy, sleep, comatose, lethargic AEDs during EEG study: Lamotrigine Technical aspects: This EEG study was done with scalp electrodes positioned according to the 10-20 International system of electrode placement. Electrical activity was acquired at a sampling rate of 500Hz  and reviewed with a high frequency filter of 70Hz  and a low frequency filter of 1Hz . EEG data were recorded continuously and digitally stored. Description: The posterior dominant rhythm consists of 8 Hz activity of moderate voltage (25-35 uV) seen predominantly in posterior head regions, asymmetric(L<R) and reactive to eye opening and eye closing. EEG showed continuous left hemispheric 3 to 6 Hz theta-delta slowing.  Hyperventilation and photic stimulation were not performed.   ABNORMALITY - Continuous slow, left hemisphere - Background asymmetry, left<right  IMPRESSION: This study is suggestive of cortical dysfunction in left hemisphere likely secondary to underlying infarct and can also be seen in post-ictal state. No seizures or epileptiform discharges were seen throughout the recording.  Charlsie Quest   MR BRAIN WO CONTRAST  Result Date: 04/20/2020 CLINICAL DATA:  Mental status change, unknown cause EXAM: MRI HEAD WITHOUT CONTRAST TECHNIQUE: Multiplanar, multiecho pulse sequences of the brain and surrounding structures were  obtained without intravenous contrast. COMPARISON:  04/20/2020 and prior. FINDINGS: Brain: Chronic 3-4 mm focal left corona radiata restricted diffusion, less conspicuous than prior exam. Minimal cortically based restricted diffusion involving residual left parietal parenchyma, chronic sequela demonstrated on prior exams. Chronic left MCA and PCA territory infarcts with sequela of wallerian degeneration, superficial siderosis and cortical laminar necrosis. Ex vacuo dilatation of the left greater than right lateral ventricles. Scattered remote microhemorrhages involving the bilateral cerebrum/cerebellum, brainstem and thalami. Confluent T2 hyperintense supratentorial white matter signal may reflect gliosis versus chronic microvascular ischemic changes. No significant midline shift. Multifocal chronic lacunar insults involving the right corona radiata/basal ganglia, pons and bilateral cerebellum. No mass lesion.  No extra-axial fluid collection. Vascular: Chronic right V4 segment high-grade narrowing. Chronic distal left M1 segment occlusion. T2 hyperintense signal within the left petrous ICA may reflect slow flow versus high-grade narrowing. Skull and upper cervical spine: Normal marrow signal. Sinuses/Orbits: Normal orbits. Clear paranasal sinuses. No mastoid effusion. Other: None. IMPRESSION: No acute intracranial finding. 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 Multifocal chronic lacunar insults as detailed above. Chronic high-grade narrowing of the right V4 segment and distal left M1 segment occlusion. Left petrous ICA T2 hyperintense signal may reflect slow flow versus high-grade narrowing. These results were called by telephone at the time of interpretation on 04/20/2020 at 4:04 pm to provider [facility] , who verbally acknowledged these results. Electronically Signed   By: Stana Bunting M.D.   On: 04/20/2020 16:19   DG CHEST PORT 1 VIEW  Result Date: 04/20/2020 CLINICAL DATA:   Encephalopathy EXAM: PORTABLE CHEST 1 VIEW COMPARISON:  March 10, 2020 FINDINGS: Lungs are clear. Heart size and pulmonary vascularity are normal. No adenopathy. Loop recorder on left. There is aortic atherosclerosis. No bone lesions. IMPRESSION: Lungs clear.  Heart size normal.  Loop recorder on left. Aortic Atherosclerosis (ICD10-I70.0). Electronically Signed   By: Bretta Bang III M.D.   On: 04/20/2020 14:59   DG Swallowing Func-Speech Pathology  Result Date: 04/22/2020 Objective Swallowing Evaluation: Type of Study:  MBS-Modified Barium Swallow Study  Patient Details Name: Eain Mullendore. MRN: [id number] Date of Birth: August 04, 1950 Today's Date: 04/22/2020 Time: SLP Start Time (ACUTE ONLY): 0948 -SLP Stop Time (ACUTE ONLY): 1005 SLP Time Calculation (min) (ACUTE ONLY): 17 min Past Medical History: Past Medical History: Diagnosis Date . Acute blood loss anemia  . Acute embolic stroke (HCC)  . Acute lower UTI  . AMS (altered mental status) 06/05/2019 . Cerebral edema (HCC) 04/04/2018 . Diabetes 1.5, managed as type 2 (HCC)  . Diabetes mellitus without complication (HCC)  . High cholesterol  . High glucose   Diabetes . History of CVA with residual deficit  . History of Dysphagia, post-stroke  . Hypertension  . Ischemic stroke (HCC) 03/05/2019 . Labile blood glucose  . 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 Renal insufficiency 04/04/2018 . Seizures (HCC)  . Stroke (HCC)  . UTI (urinary tract infection) 03/05/2019 Past Surgical History: Past Surgical History: Procedure Laterality Date . IR PATIENT EVAL TECH 0-60 MINS  03/30/2018 . LOOP RECORDER INSERTION N/A 03/08/2019  Procedure: LOOP RECORDER INSERTION;  Surgeon: Regan Lemming, MD;  Location: [facility] INVASIVE CV LAB;  Service: Cardiovascular;  Laterality: N/A; . TEE WITHOUT CARDIOVERSION N/A 04/02/2018  Procedure: TRANSESOPHAGEAL ECHOCARDIOGRAM (TEE);  Surgeon: [name], MD;  Location: [facility] ENDOSCOPY;  Service: Cardiovascular;   Laterality: N/A; HPI: Patient is a 69 y.o. male with PMH: HTN, HLD, DM-2, multiple CVA with residual right-sided hemiparesis, seizures, who was found by daughter pushing into the wall, acting more drowsy than normal and with elevated blood pressure. EMS was called for code stroke. At baseline, patient has right-sided deficits with no movement of right extremities, contracture of right UE, aphasia: "yes/no" are his only words and he uses gestures to communicate. He is wheelchair bound with assistance to stand for toileting.  Subjective: alert, cooperative, pleasant Assessment / Plan / Recommendation CHL IP CLINICAL IMPRESSIONS 04/22/2020 Clinical Impression Pt presents with a moderate oropharyngeal dysphagia.  Pt's oral phase is prolonged due to decreased posterior propulsion of boluses towards the pharynx.  Pt's swallow response is triggered consistently at the pyriform sinuses which could be attributable to sensory deficits post CVA.  He silently aspirated thin liquids both before and during the swallow.  He also appeared to have deep penetration leading to eventual aspiration of residual purees from the vallecula following administration of barium tablet whole in applesauce although the actual event was not visualized but rather noted upon return to pharynx following esophageal sweep.   As a result, pt may be advanced to dys 1, nectar thick liquids with full supervision for use of the following swallow precautions: avoid mixed consistencies, small bites and sips, upright for meals and for 30-60 min after, meds crushed in puree.   SLP Visit Diagnosis Dysphagia, oropharyngeal phase (R13.12) Attention and concentration deficit following -- Frontal lobe and executive function deficit following -- Impact on safety and function Moderate aspiration risk   CHL IP TREATMENT RECOMMENDATION 04/22/2020 Treatment Recommendations Therapy as outlined in treatment plan below   Prognosis 04/22/2020 Prognosis for Safe Diet  Advancement Good Barriers to Reach Goals Time post onset;Severity of deficits Barriers/Prognosis Comment -- CHL IP DIET RECOMMENDATION 04/22/2020 SLP Diet Recommendations Dysphagia 1 (Puree) solids;Nectar thick liquid Liquid Administration via Cup;No straw Medication Administration Crushed with puree Compensations Minimize environmental distractions;Slow rate;Small sips/bites Postural Changes Remain semi-upright after after feeds/meals (Comment);Seated upright at 90 degrees   CHL IP OTHER RECOMMENDATIONS 04/22/2020 Recommended Consults -- Oral Care Recommendations Oral care BID  Other Recommendations Order thickener from pharmacy;Clarify dietary restrictions;Prohibited food (jello, ice cream, thin soups);Remove water pitcher   CHL IP FOLLOW UP RECOMMENDATIONS 04/22/2020 Follow up Recommendations Skilled Nursing facility;24 hour supervision/assistance   CHL IP FREQUENCY AND DURATION 04/22/2020 Speech Therapy Frequency (ACUTE ONLY) min 2x/week Treatment Duration 2 weeks      CHL IP ORAL PHASE 04/22/2020 Oral Phase Impaired Oral - Pudding Teaspoon -- Oral - Pudding Cup -- Oral - Honey Teaspoon -- Oral - Honey Cup Reduced posterior propulsion;Delayed oral transit Oral - Nectar Teaspoon -- Oral - Nectar Cup Reduced posterior propulsion;Delayed oral transit Oral - Nectar Straw -- Oral - Thin Teaspoon -- Oral - Thin Cup Reduced posterior propulsion;Delayed oral transit Oral - Thin Straw Reduced posterior propulsion;Delayed oral transit Oral - Puree Reduced posterior propulsion;Delayed oral transit Oral - Mech Soft Reduced posterior propulsion;Delayed oral transit Oral - Regular -- Oral - Multi-Consistency -- Oral - Pill -- Oral Phase - Comment --  CHL IP PHARYNGEAL PHASE 04/22/2020 Pharyngeal Phase Impaired Pharyngeal- Pudding Teaspoon -- Pharyngeal -- Pharyngeal- Pudding Cup -- Pharyngeal -- Pharyngeal- Honey Teaspoon -- Pharyngeal -- Pharyngeal- Honey Cup Delayed swallow initiation-pyriform sinuses Pharyngeal --  Pharyngeal- Nectar Teaspoon -- Pharyngeal -- Pharyngeal- Nectar Cup Delayed swallow initiation-pyriform sinuses Pharyngeal -- Pharyngeal- Nectar Straw -- Pharyngeal -- Pharyngeal- Thin Teaspoon -- Pharyngeal -- Pharyngeal- Thin Cup Delayed swallow initiation-pyriform sinuses;Penetration/Aspiration before swallow;Penetration/Aspiration during swallow Pharyngeal Material enters airway, passes BELOW cords without attempt by patient to eject out (silent aspiration) Pharyngeal- Thin Straw Delayed swallow initiation-pyriform sinuses Pharyngeal -- Pharyngeal- Puree Delayed swallow initiation-pyriform sinuses;Pharyngeal residue - valleculae Pharyngeal -- Pharyngeal- Mechanical Soft Delayed swallow initiation-pyriform sinuses;Pharyngeal residue - valleculae Pharyngeal -- Pharyngeal- Regular -- Pharyngeal -- Pharyngeal- Multi-consistency -- Pharyngeal -- Pharyngeal- Pill Delayed swallow initiation-pyriform sinuses;Pharyngeal residue - valleculae;Penetration/Apiration after swallow Pharyngeal -- Pharyngeal Comment --  CHL IP CERVICAL ESOPHAGEAL PHASE 11/22/2019 Cervical Esophageal Phase WFL Pudding Teaspoon -- Pudding Cup -- Honey Teaspoon -- Honey Cup -- Nectar Teaspoon -- Nectar Cup -- Nectar Straw -- Thin Teaspoon -- Thin Cup -- Thin Straw -- Puree -- Mechanical Soft -- Regular -- Multi-consistency -- Pill -- Cervical Esophageal Comment -- Maryjane Hurter 04/22/2020, 10:39 AM              DG Swallowing Func-Speech Pathology  Final Result    MR BRAIN WO CONTRAST  Final Result    DG CHEST PORT 1 VIEW  Final Result    CT HEAD CODE STROKE WO CONTRAST  Final Result      Scheduled Meds: .  stroke: mapping our early stages of recovery book   Does not apply Once  . amLODipine  10 mg Oral Daily  . aspirin EC  325 mg Oral q morning - 10a  . atorvastatin  40 mg Oral Q0600  . carvedilol  12.5 mg Oral BID WC  . clopidogrel  75 mg Oral Daily  . enoxaparin (LOVENOX) injection  40 mg Subcutaneous Daily  . indapamide   1.25 mg Oral Daily  . insulin aspart  0-15 Units Subcutaneous TID WC  . lamoTRIgine  150 mg Oral BID  . lisinopril  20 mg Oral BID  . midodrine  5 mg Oral TID WC  . tamsulosin  0.4 mg Oral QPC supper   PRN Meds: acetaminophen **OR** acetaminophen (TYLENOL) oral liquid 160 mg/5 mL **OR** acetaminophen, hydrALAZINE, traZODone Continuous Infusions: . sodium chloride 50 mL/hr at 04/21/20 2027     LOS: 1 day  Time spent: Greater than 50% of  the 35 minute visit was spent in counseling/coordination of care for the patient as laid out in the A&P.   Lewie Chamber, MD [facility] 04/22/2020, 1:39 PM

## 2020-04-22 NOTE — Progress Notes (Signed)
Modified Barium Swallow Progress Note  Patient Details  Name: Douglas [name]. MRN: [id number] Date of Birth: [birth year]  Today's Date: 04/22/2020  Modified Barium Swallow completed.  Full report located under Chart Review in the Imaging Section.  Brief recommendations include the following:  Clinical Impression    Pt presents with a moderate oropharyngeal dysphagia.  Pt's oral phase is prolonged due to decreased posterior propulsion of boluses towards the pharynx.  Pt's swallow response is triggered consistently at the pyriform sinuses which could be attributable to sensory deficits post CVA.  He silently aspirated thin liquids both before and during the swallow.  He also appeared to have deep penetration leading to eventual aspiration of residual purees from the vallecula following administration of barium tablet whole in applesauce although the actual event was not visualized but rather noted upon return to pharynx following esophageal sweep.   As a result, pt may be advanced to dys 1, nectar thick liquids with full supervision for use of the following swallow precautions: avoid mixed consistencies, small bites and sips, upright for meals and for 30-60 min after, meds crushed in puree.     Swallow Evaluation Recommendations       SLP Diet Recommendations: Dysphagia 1 (Puree) solids;Nectar thick liquid   Liquid Administration via: Cup;No straw   Medication Administration: Crushed with puree   Supervision: Full supervision/cueing for compensatory strategies   Compensations: Minimize environmental distractions;Slow rate;Small sips/bites   Postural Changes: Remain semi-upright after after feeds/meals (Comment);Seated upright at 90 degrees   Oral Care Recommendations: Oral care BID   Other Recommendations: Order thickener from pharmacy;Clarify dietary restrictions;Prohibited food (jello, ice cream, thin soups);Remove water pitcher    Ramya Vanbergen, Joni Reining L 04/22/2020,10:48 AM

## 2020-04-22 NOTE — Telephone Encounter (Signed)
Speech therapist called to inform Douglas [name] that mutual patient was admitted to [facility] yesterday, FYI.  I informed Douglas [name] that we are part of [place] and able to see hospital encounters. Douglas [name] verbalized understanding

## 2020-04-23 DIAGNOSIS — E43 Unspecified severe protein-calorie malnutrition: Secondary | ICD-10-CM | POA: Insufficient documentation

## 2020-04-23 LAB — MAGNESIUM: Magnesium: 1.9 mg/dL (ref 1.7–2.4)

## 2020-04-23 LAB — BASIC METABOLIC PANEL
Anion gap: 9 (ref 5–15)
BUN: 12 mg/dL (ref 8–23)
CO2: 27 mmol/L (ref 22–32)
Calcium: 9.4 mg/dL (ref 8.9–10.3)
Chloride: 102 mmol/L (ref 98–111)
Creatinine, Ser: 0.97 mg/dL (ref 0.61–1.24)
GFR, Estimated: >60 mL/min (ref >60)
Glucose, Bld: 152 mg/dL — ABNORMAL HIGH (ref 70–99)
Potassium: 3.6 mmol/L (ref 3.5–5.1)
Sodium: 138 mmol/L (ref 135–145)

## 2020-04-23 LAB — CBC WITH DIFFERENTIAL/PLATELET
Abs Immature Granulocytes: 0.01 10*3/uL (ref 0.00–0.07)
Basophils Absolute: 0 10*3/uL (ref 0.0–0.1)
Basophils Relative: 0 %
Eosinophils Absolute: 0.2 10*3/uL (ref 0.0–0.5)
Eosinophils Relative: 3 %
HCT: 39.1 % (ref 39.0–52.0)
Hemoglobin: 12.6 g/dL — ABNORMAL LOW (ref 13.0–17.0)
Immature Granulocytes: 0 %
Lymphocytes Relative: 22 %
Lymphs Abs: 1.3 10*3/uL (ref 0.7–4.0)
MCH: 29.4 pg (ref 26.0–34.0)
MCHC: 32.2 g/dL (ref 30.0–36.0)
MCV: 91.4 fL (ref 80.0–100.0)
Monocytes Absolute: 0.6 10*3/uL (ref 0.1–1.0)
Monocytes Relative: 10 %
Neutro Abs: 3.9 10*3/uL (ref 1.7–7.7)
Neutrophils Relative %: 65 %
Platelets: 252 10*3/uL (ref 150–400)
RBC: 4.28 MIL/uL (ref 4.22–5.81)
RDW: 13.6 % (ref 11.5–15.5)
WBC: 5.9 10*3/uL (ref 4.0–10.5)
nRBC: 0 % (ref 0.0–0.2)

## 2020-04-23 LAB — GLUCOSE, CAPILLARY
Glucose-Capillary: 151 mg/dL — ABNORMAL HIGH (ref 70–99)
Glucose-Capillary: 187 mg/dL — ABNORMAL HIGH (ref 70–99)
Glucose-Capillary: 302 mg/dL — ABNORMAL HIGH (ref 70–99)
Glucose-Capillary: 309 mg/dL — ABNORMAL HIGH (ref 70–99)
Glucose-Capillary: 269 mg/dL — ABNORMAL HIGH (ref 70–99)
Glucose-Capillary: 331 mg/dL — ABNORMAL HIGH (ref 70–99)

## 2020-04-23 MED ORDER — LORAZEPAM 2 MG/ML IJ SOLN
1.0000 mg | Freq: Once | INTRAMUSCULAR | Status: AC
  Administered 2020-04-23: 1 mg via INTRAVENOUS
  Filled 2020-04-23: qty 1

## 2020-04-23 NOTE — Progress Notes (Signed)
Occupational Therapy Treatment Patient Details Name: Douglas [name]. MRN: [id number] DOB: Aug 31, 1950 Today's Date: 04/23/2020    History of present illness 69 yo male presenting with acute metabolic encephalopathy with concern for seizure vs. CVA. Previous hospital admission 10/2019 with AMS and concern for seizure vs. underlying CVA. 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   OT comments  OT treatment session with focus on bed mobility, functional transfers, and NMR. Patient with continued difficulty accurately responding to "yes" "no" questions. Excessive drooling and pocketing of food from breakfast meal noted as well. RN made aware. Patient continues to be limited by dense R hemiplegia, ataxia, R visual field cut, R inattention, and need for Max A to +2 assist grossly for ADLs and ADL transfers. Patient unable to attain full upright position in standing despite +2 assist and maximal verbal, visual, and tactile cues. Max A to Total A +2 for squat-pivot transfer to recliner with draw sheet and chuck pad. Patient with minimal initiation to assist. Session concluded with patient seated in recliner with lift pad beneath him, RUE supported on pillows with towel roll in R hand, bilateral feet floated to maintain skin integrity, and chair alarm activated. RN aware of patient position. Patient would benefit from continued acute OT services to maximize safety and independence and decrease caregiver burden.    Follow Up Recommendations  SNF    Equipment Recommendations  None recommended by OT    Recommendations for Other Services      Precautions / Restrictions Precautions Precautions: Fall Precaution Comments: Dense R hemiplegia with contractures Restrictions Weight Bearing Restrictions: No       Mobility Bed Mobility Overal bed mobility: Needs Assistance Bed Mobility: Rolling;Sidelying to Sit Rolling: Max assist;Total  assist Sidelying to sit: Mod assist;+2 for physical assistance       General bed mobility comments: Patient able to roll to R with Max A +rail with hand over hand assist. Mod A +2 at trunk and BLE with patient able to assist at trunk.  Transfers Overall transfer level: Needs assistance Equipment used: None Transfers: Squat Pivot Transfers     Squat pivot transfers: Max assist;+2 physical assistance     General transfer comment: Squat-pivot to recliner with Max to Total A +2 and bilateral knee block. Patient unable to follow cues for hand placement 2/2 decreased motor planning and ataxia.    Balance Overall balance assessment: Needs assistance Sitting-balance support: Single extremity supported;Feet supported Sitting balance-Leahy Scale: Poor Sitting balance - Comments: Reliant on external assist to maintain static sitting balance.   Standing balance support: Bilateral upper extremity supported Standing balance-Leahy Scale: Zero Standing balance comment: Max A +2 to initiate standing with HHA +2 and bilateral knee block. Patietnt with flexed posture at hips and knees with inability to attain full upright position despite maximal multimodal cues.                           ADL either performed or assessed with clinical judgement   ADL Overall ADL's : Needs assistance/impaired     Grooming: Moderate assistance Grooming Details (indicate cue type and reason): Mod A to wash face. Patient direct gaze to R to find washcloth. Only able to wash face after OT placed washcloth in L hand.         Upper Body Dressing : Maximal assistance;Bed level   Lower Body Dressing: Total assistance;Bed level  Toilet Transfer: Total assistance                   Vision   Additional Comments: R visual field cut at baseline. Patient does not attend to R visual field.   Perception     Praxis      Cognition Arousal/Alertness: Awake/alert Behavior During Therapy: Flat  affect Overall Cognitive Status: No family/caregiver present to determine baseline cognitive functioning                                 General Comments: Aphasia at baseline. Patient unable to make needs known. Does not respond appropriately to "yes" "no" questions.        Exercises     Shoulder Instructions       General Comments      Pertinent Vitals/ Pain       Pain Assessment: Faces Faces Pain Scale: No hurt Pain Intervention(s): Monitored during session  Home Living                                          Prior Functioning/Environment              Frequency  Min 2X/week        Progress Toward Goals  OT Goals(current goals can now be found in the care plan section)  Progress towards OT goals: Progressing toward goals  Acute Rehab OT Goals Patient Stated Goal: Unable OT Goal Formulation: Patient unable to participate in goal setting Time For Goal Achievement: 05/05/20 Potential to Achieve Goals: Fair ADL Goals Pt Will Perform Eating: with min assist Pt Will Perform Grooming: with min assist;sitting Pt Will Perform Upper Body Dressing: with min assist;sitting Pt Will Perform Lower Body Dressing: with mod assist;sit to/from stand Additional ADL Goal #1: Patient will maintain static sitting balance at EOB with unilateral UE support and supervision A.  Plan Discharge plan remains appropriate;Frequency remains appropriate    Co-evaluation                 AM-PAC OT "6 Clicks" Daily Activity     Outcome Measure   Help from another person eating meals?: A Lot Help from another person taking care of personal grooming?: A Lot Help from another person toileting, which includes using toliet, bedpan, or urinal?: Total Help from another person bathing (including washing, rinsing, drying)?: Total Help from another person to put on and taking off regular upper body clothing?: A Lot Help from another person to put on and  taking off regular lower body clothing?: Total 6 Click Score: 9    End of Session Equipment Utilized During Treatment: Gait belt  OT Visit Diagnosis: Other abnormalities of gait and mobility (R26.89);Muscle weakness (generalized) (M62.81);Ataxia, unspecified (R27.0);Other symptoms and signs involving the nervous system (R29.898);Cognitive communication deficit (R41.841);Hemiplegia and hemiparesis Symptoms and signs involving cognitive functions: Cerebral infarction Hemiplegia - Right/Left: Right Hemiplegia - dominant/non-dominant: Dominant Hemiplegia - caused by: Cerebral infarction   Activity Tolerance Patient tolerated treatment well   Patient Left in chair;with call bell/phone within reach;with chair alarm set   Nurse Communication Mobility status;Other (comment) (Patient position in chair and noted excessive drooling.)        Time: 6761-9509 OT Time Calculation (min): 28 min  Charges: OT General Charges $OT Visit: 1 Visit OT Treatments $Self Care/Home Management :  8-22 mins $Therapeutic Activity: 8-22 mins  Elham Fini H. OTR/L Supplemental OT, Department of rehab services [phone]   Paul Torpey R H. 04/23/2020, 12:23 PM

## 2020-04-23 NOTE — Progress Notes (Signed)
Inpatient Diabetes Program Recommendations  AACE/ADA: New Consensus Statement on Inpatient Glycemic Control   Target Ranges:  Prepandial:   less than 140 mg/dL      Peak postprandial:   less than 180 mg/dL (1-2 hours)      Critically ill patients:  140 - 180 mg/dL   Results for JARELLE, ATES (MRN [id number]) as of 04/23/2020 12:11  Ref. Range 04/22/2020 08:29 04/22/2020 12:42 04/22/2020 16:48 04/22/2020 21:25 04/23/2020 06:06 04/23/2020 07:21 04/23/2020 10:40  Glucose-Capillary Latest Ref Range: 70 - 99 mg/dL 237 (H) 628 (H) 315 (H) 313 (H) 151 (H) 187 (H) 302 (H)   Review of Glycemic Control  Diabetes history: DM2 Outpatient Diabetes medications: Levemir 16 units daily, Novolog 2-15 units QID, Glipizide 10 mg BID Current orders for Inpatient glycemic control: Novolog 0-15 units TID with meals and at bedtime  Inpatient Diabetes Program Recommendations:    Insulin: Please consider ordering Levemir 5 units Q24H and Novolog 3 units TID with meals for meal coverage if patient eats at least 50% of meals.  Thanks, Orlando Penner, RN, MSN, CDE Diabetes Coordinator Inpatient Diabetes Program [phone] (Team Pager from 8am to 5pm)

## 2020-04-23 NOTE — Progress Notes (Signed)
  Speech Language Pathology Treatment: Dysphagia  Patient Details Name: Douglas [name]. MRN: [id number] DOB: [birth year] Today's Date: 04/23/2020 Time: 1150-1209 SLP Time Calculation (min) (ACUTE ONLY): 19 min  Assessment / Plan / Recommendation Clinical Impression  Pt progressed last tx session to nectar-thickened liquids and was able to consume via tsp and guided sips via cup with mod verbal/tactile cues provided by SLP without overt s/s of aspiration; impulsivity and volume control diminished when pt consumed thin via cup with immediate cough noted; multiple swallows and oral holding/decreased oral manipulation/propulsion noted with delay in the initiation of the swallow with puree/nectar-thickened liquids.  Recommend continue Dysphagia 1/nectar-thickened liquids with FULL supervision and tsp amounts of nectar-thickened liquids or guided sips via cup to minimize risk for aspiration.  ST will continue to f/u for diet progression/education re: compensatory strategies/safety.  HPI HPI: Patient is a 69 y.o. male with PMH: HTN, HLD, DM-2, multiple CVA with residual right-sided hemiparesis, seizures, who was found by daughter pushing into the wall, acting more drowsy than normal and with elevated blood pressure. EMS was called for code stroke. At baseline, patient has right-sided deficits with no movement of right extremities, contracture of right UE, aphasia: "yes/no" are his only words and he uses gestures to communicate. He is wheelchair bound with assistance to stand for toileting.      SLP Plan  Continue with current plan of care       Recommendations  Diet recommendations: Nectar-thick liquid;Dysphagia 1 (puree) Liquids provided via: Teaspoon;Cup Medication Administration: Crushed with puree Supervision: Staff to assist with self feeding;Full supervision/cueing for compensatory strategies Compensations: Minimize environmental distractions;Slow rate;Small sips/bites                 Oral Care Recommendations: Oral care BID Follow up Recommendations: Skilled Nursing facility SLP Visit Diagnosis: Dysphagia, oropharyngeal phase (R13.12) Plan: Continue with current plan of care                      Tressie Stalker, M.S., CCC-SLP 04/23/2020, 1:03 PM

## 2020-04-23 NOTE — NC FL2 (Signed)
[place] MEDICAID FL2 LEVEL OF CARE SCREENING TOOL     IDENTIFICATION  Patient Name: Douglas [name]. Birthdate: [birth year] Sex: male Admission Date (Current Location): 04/20/2020  [facility] and [place] Number:  Producer, television/film/video and Address:  [facility], [address], [place], [place] [address]      Provider Number: [id number]  Attending Physician Name and Address:  Lewie Chamber, MD  Relative Name and Phone Number:   He[place] [phone]    Current Level of Care: Hospital Recommended Level of Care: Skilled Nursing Facility Prior Approval Number:    Date Approved/Denied:   PASRR Number: [id number]  Discharge Plan: SNF    Current Diagnoses: Patient Active Problem List   Diagnosis Date Noted  . Protein-calorie malnutrition, severe 04/23/2020  . Physical debility 04/22/2020  . Hyperlipidemia 04/20/2020  . Slow transit constipation 12/26/2019  . Seizure (HCC) 11/20/2019  . Seizures (HCC) 11/20/2019  . Encephalopathy 11/19/2019  . Delayed orthostatic hypotension 08/04/2019  . Seizure disorder (HCC) 07/21/2019  . 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 Shoulder subluxation, right, sequela 07/16/2019  . Altered mental status 07/15/2019  . 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 Late effects of CVA (cerebrovascular accident)   . Pressure injury of skin 06/08/2019  . History of loop recorder 03/08/2019  . UTI (urinary tract infection) 03/05/2019  . 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 Global aphasia   . Type 2 diabetes mellitus with peripheral neuropathy (HCC)   . Hypertension associated with diabetes (HCC)   . Hyperlipidemia associated with type 2 diabetes mellitus (HCC)   . History of CVA (cerebrovascular accident)   . History of Dysphagia, post-stroke     Orientation RESPIRATION BLADDER Height & Weight      (unable to assess due to  receptive aphasia; expressive aphasia)  Normal Incontinent (external catheter) Weight: 158 lb 12.8 oz (72 kg) Height:  6\' 1"  (185.4 cm)  BEHAVIORAL SYMPTOMS/MOOD NEUROLOGICAL BOWEL NUTRITION STATUS  Other (Comment) (not able to assess)   Incontinent Diet (dysphagia)  AMBULATORY STATUS COMMUNICATION OF NEEDS Skin   Extensive Assist Non-Verbally (Receptive aphasia, Expressive aphasis incomprehensible) Skin abrasions,Other (Comment) (pressure injury: sacrum mid stage 2-partial thickness loss of dermis presenting as a shallow open injury with a red, pink wound bed without slough, clean pink.)                       Personal Care Assistance Level of Assistance  Bathing,Feeding,Dressing,Total care Bathing Assistance: Maximum assistance Feeding assistance: Maximum assistance Dressing Assistance: Maximum assistance Total Care Assistance: Maximum assistance   Functional Limitations Info  Speech (Receptive aphasia; expressive aphasis) Sight Info: Adequate Hearing Info: Adequate Speech Info:  (receptive aphasia; expressive aphasia)    SPECIAL CARE FACTORS FREQUENCY  OT (By licensed OT),PT (By licensed PT)     PT Frequency: 5x OT Frequency: 5 x            Contractures Contractures Info: Present (Right ebow)    Additional Factors Info  Code Status Code Status Info: Full             Current Medications (04/23/2020):  This is the current hospital active medication list Current Facility-Administered Medications  Medication Dose Route Frequency Provider Last Rate Last Admin  .  stroke: mapping our early stages of recovery book   Does not apply Once 04/25/2020, MD      .  0.9 %  sodium chloride infusion   Intravenous Continuous Madelyn Flavors A, MD 50 mL/hr at 04/23/20 1156 New Bag at 04/23/20 1156  . acetaminophen (TYLENOL) tablet 650 mg  650 mg Oral Q4H PRN Madelyn Flavors A, MD   650 mg at 04/22/20 0117   Or  . acetaminophen (TYLENOL) 160 MG/5ML solution 650 mg  650 mg Per  Tube Q4H PRN Madelyn Flavors A, MD       Or  . acetaminophen (TYLENOL) suppository 650 mg  650 mg Rectal Q4H PRN Katrinka Blazing, Rondell A, MD      . amLODipine (NORVASC) tablet 10 mg  10 mg Oral Daily Katrinka Blazing, Rondell A, MD   10 mg at 04/22/20 0913  . aspirin EC tablet 325 mg  325 mg Oral q morning - 10a Smith, Rondell A, MD   325 mg at 04/23/20 1026  . atorvastatin (LIPITOR) tablet 40 mg  40 mg Oral Q0600 Madelyn Flavors A, MD   40 mg at 04/23/20 0749  . carvedilol (COREG) tablet 12.5 mg  12.5 mg Oral BID WC Smith, Rondell A, MD   12.5 mg at 04/23/20 0749  . clopidogrel (PLAVIX) tablet 75 mg  75 mg Oral Daily Katrinka Blazing, Rondell A, MD   75 mg at 04/23/20 1026  . enoxaparin (LOVENOX) injection 40 mg  40 mg Subcutaneous Daily Smith, Rondell A, MD   40 mg at 04/23/20 1026  . hydrALAZINE (APRESOLINE) injection 5 mg  5 mg Intravenous Q4H PRN Eduard Clos, MD   5 mg at 04/22/20 0051  . indapamide (LOZOL) tablet 1.25 mg  1.25 mg Oral Daily Smith, Rondell A, MD   1.25 mg at 04/23/20 1027  . insulin aspart (novoLOG) injection 0-15 Units  0-15 Units Subcutaneous TID AC & HS Zierle-Ghosh, Asia B, DO   11 Units at 04/23/20 1137  . lamoTRIgine (LAMICTAL) tablet 150 mg  150 mg Oral BID Madelyn Flavors A, MD   150 mg at 04/23/20 1027  . lisinopril (ZESTRIL) tablet 20 mg  20 mg Oral BID Madelyn Flavors A, MD   20 mg at 04/22/20 2120  . midodrine (PROAMATINE) tablet 5 mg  5 mg Oral TID WC Smith, Rondell A, MD   5 mg at 04/23/20 0749  . multivitamin with minerals tablet 1 tablet  1 tablet Oral Daily Lewie Chamber, MD   1 tablet at 04/23/20 1026  . tamsulosin (FLOMAX) capsule 0.4 mg  0.4 mg Oral QPC supper Madelyn Flavors A, MD   0.4 mg at 04/22/20 1723  . traZODone (DESYREL) tablet 25 mg  25 mg Oral QHS PRN Clydie Braun, MD   25 mg at 04/22/20 0054     Discharge Medications: Please see discharge summary for a list of discharge medications.  Relevant Imaging Results:  Relevant Lab Results:   Additional  Information AAN [id number]  Arvin Collard, Theresia Majors

## 2020-04-23 NOTE — Progress Notes (Signed)
PROGRESS NOTE    Douglas L Clink Jr.   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 His baseline is that he says "yes" to any question asked and has severe right-sided hemiplegia. However, he is able to follow some commands at home such as handing his daughter a drink for her to open or other simple gestures. There was concern for possible stroke on admission and neurology was also consulted. He underwent stroke work-up with CT head and MRI brain as well as EEG. No acute findings were seen on MRI, just old chronic and large left MCA/PCA infarcts.  EEG revealed cortical dysfunction in the left hemisphere with slowing noted which was considered to have possibly been associated with a post ictal state. No active seizure activity noted on EEG. Etiology of his presentation was considered likely due to a probable underlying seizure at home prior to admission but no acute stroke. He was recommended by neurology to continue on previous regimen of seizure medication and secondary stroke prevention medications. He was also evaluated by physical therapy and his daughter was hopeful for patient going to rehab at discharge as she states that he regained some function and came home stronger after rehab.   Interval History:  No events overnight. Awake in bed this am. Lifts left arm to command.   Old records reviewed in assessment of this patient  ROS: Constitutional: negative for chills and fevers, Respiratory: negative for cough, Cardiovascular: negative for chest pain and Gastrointestinal: negative for abdominal pain  Assessment & Plan: * Acute metabolic encephalopathy-resolved as of 04/22/2020 -Likely postictal from seizure. No  active seizure on EEG -Neurology has seen patient -Continue current seizure regimen -Baseline mentation is answering "yes" to all questions which he is perceivably back to baseline at this point  Seizure disorder (HCC) -Slowing seen on EEG but no active seizure activity -Continue Lamictal 150 mg twice daily. If witnessed seizure, per neurology would then increase to 200 mg twice daily  History of CVA (cerebrovascular accident) -No new strokes seen on MRI brain -Large left MCA/PCA old strokes noted -Continue aspirin, Plavix, statin  Physical debility -Patient is from private residence with 24/7 care. PT recommending to consider SNF -We will follow-up with SW/CM and family discussions  Hyperlipidemia -Continue statin  BPH with obstruction/lower urinary tract symptoms -Continue Flomax  Type 2 diabetes mellitus with peripheral neuropathy (HCC) -Continue SSI and CBG monitoring -Last A1c 6.5% on 03/06/2020 -Levemir on hold   Antimicrobials:   DVT prophylaxis: Lovenox Code Status: Full Family Communication: None present Disposition Plan: Status is: Inpatient  Remains inpatient appropriate because:Altered mental status, Unsafe d/c plan and Inpatient level of care appropriate due to severity of illness   Dispo: The patient is from: Home              Anticipated d/c is to: SNF              Anticipated d/c date is: > 3 days              Patient currently is not medically stable to d/c.  Objective: Blood pressure (!) 86/63, pulse 92, temperature 97.6 F (36.4 C), temperatuAshok Cordiaresp. rate 18, height  (1.854 m),  weight 72 kg, SpO2 100 %.  Examination: General appearance: Chronically ill appearing elderly man laying in bed more awake today and following simple command of lifting left arm Head: Normocephalic, without obvious abnormality, atraumatic Eyes: EOMI Lungs: clear to auscultation bilaterally Heart: regular rate and rhythm and S1, S2 normal Abdomen:  normal findings: bowel sounds normal and soft, non-tender Extremities: Contracted right wrist. No edema Skin: mobility and turgor normal Neurologic: Follows commands of lifting left arm  Consultants:   Neurology  Procedures:   EEG  Data Reviewed: I have personally reviewed following labs and imaging studies Results for orders placed or performed during the hospital encounter of 04/20/20 (from the past 24 hour(s))  Glucose, capillary     Status: Abnormal   Collection Time: 04/22/20  4:48 PM  Result Value Ref Range   Glucose-Capillary 109 (H) 70 - 99 mg/dL   Comment 1 Notify RN    Comment 2 Document in Chart   Glucose, capillary     Status: Abnormal   Collection Time: 04/22/20  9:25 PM  Result Value Ref Range   Glucose-Capillary 313 (H) 70 - 99 mg/dL  Basic metabolic panel     Status: Abnormal   Collection Time: 04/23/20  2:55 AM  Result Value Ref Range   Sodium 138 135 - 145 mmol/L   Potassium 3.6 3.5 - 5.1 mmol/L   Chloride 102 98 - 111 mmol/L   CO2 27 22 - 32 mmol/L   Glucose, Bld 152 (H) 70 - 99 mg/dL   BUN 12 8 - 23 mg/dL   Creatinine, Ser 2.37 0.61 - 1.24 mg/dL   Calcium 9.4 8.9 - 62.8 mg/dL   GFR, Estimated >31 >51 mL/min   Anion gap 9 5 - 15  CBC with Differential/Platelet     Status: Abnormal   Collection Time: 04/23/20  2:55 AM  Result Value Ref Range   WBC 5.9 4.0 - 10.5 K/uL   RBC 4.28 4.22 - 5.81 MIL/uL   Hemoglobin 12.6 (L) 13.0 - 17.0 g/dL   HCT 76.1 60.7 - 37.1 %   MCV 91.4 80.0 - 100.0 fL   MCH 29.4 26.0 - 34.0 pg   MCHC 32.2 30.0 - 36.0 g/dL   RDW 06.2 69.4 - 85.4 %   Platelets 252 150 - 400 K/uL   nRBC 0.0 0.0 - 0.2 %   Neutrophils Relative % 65 %   Neutro Abs 3.9 1.7 - 7.7 K/uL   Lymphocytes Relative 22 %   Lymphs Abs 1.3 0.7 - 4.0 K/uL   Monocytes Relative 10 %   Monocytes Absolute 0.6 0.1 - 1.0 K/uL   Eosinophils Relative 3 %   Eosinophils Absolute 0.2 0.0 - 0.5 K/uL   Basophils Relative 0 %   Basophils Absolute 0.0 0.0 - 0.1 K/uL    Immature Granulocytes 0 %   Abs Immature Granulocytes 0.01 0.00 - 0.07 K/uL  Magnesium     Status: None   Collection Time: 04/23/20  2:55 AM  Result Value Ref Range   Magnesium 1.9 1.7 - 2.4 mg/dL  Glucose, capillary     Status: Abnormal   Collection Time: 04/23/20  6:06 AM  Result Value Ref Range   Glucose-Capillary 151 (H) 70 - 99 mg/dL  Glucose, capillary     Status: Abnormal   Collection Time: 04/23/20  7:21 AM  Result Value Ref Range   Glucose-Capillary 187 (H) 70 - 99 mg/dL   Comment 1 Notify RN    Comment 2 Document  in Chart   Glucose, capillary     Status: Abnormal   Collection Time: 04/23/20 10:40 AM  Result Value Ref Range   Glucose-Capillary 302 (H) 70 - 99 mg/dL  Glucose, capillary     Status: Abnormal   Collection Time: 04/23/20 12:16 PM  Result Value Ref Range   Glucose-Capillary 309 (H) 70 - 99 mg/dL   Comment 1 Notify RN    Comment 2 Document in Chart     Recent Results (from the past 240 hour(s))  Resp Panel by RT-PCR (Flu A&B, Covid) Nasopharyngeal Swab     Status: None   Collection Time: 04/20/20 12:28 PM   Specimen: Nasopharyngeal Swab; Nasopharyngeal(NP) swabs in vial transport medium  Result Value Ref Range Status   SARS Coronavirus 2 by RT PCR NEGATIVE NEGATIVE Final    Comment: (NOTE) SARS-CoV-2 target nucleic acids are NOT DETECTED.  The SARS-CoV-2 RNA is generally detectable in upper respiratory specimens during the acute phase of infection. The lowest concentration of SARS-CoV-2 viral copies this assay can detect is 138 copies/mL. A negative result does not preclude SARS-Cov-2 infection and should not be used as the sole basis for treatment or other patient management decisions. A negative result may occur with  improper specimen collection/handling, submission of specimen other than nasopharyngeal swab, presence of viral mutation(s) within the areas targeted by this assay, and inadequate number of viral copies(<138 copies/mL). A negative result  must be combined with clinical observations, patient history, and epidemiological information. The expected result is Negative.  Fact Sheet for Patients:  BloggerCourse.comhttps://www.fda.gov/media/152166/download  Fact Sheet for Healthcare Providers:  SeriousBroker.ithttps://www.fda.gov/media/152162/download  This test is no t yet approved or cleared by the [place] FDA and  has been authorized for detection and/or diagnosis of SARS-CoV-2 by FDA under an Emergency Use Authorization (EUA). This EUA will remain  in effect (meaning this test can be used) for the duration of the COVID-19 declaration under Section 564(b)(1) of the Act, 21 U.S.C.section 360bbb-3(b)(1), unless the authorization is terminated  or revoked sooner.       Influenza A by PCR NEGATIVE NEGATIVE Final   Influenza B by PCR NEGATIVE NEGATIVE Final    Comment: (NOTE) The Xpert Xpress SARS-CoV-2/FLU/RSV plus assay is intended as an aid in the diagnosis of influenza from Nasopharyngeal swab specimens and should not be used as a sole basis for treatment. Nasal washings and aspirates are unacceptable for Xpert Xpress SARS-CoV-2/FLU/RSV testing.  Fact Sheet for Patients: BloggerCourse.comhttps://www.fda.gov/media/152166/download  Fact Sheet for Healthcare Providers: SeriousBroker.ithttps://www.fda.gov/media/152162/download  This test is not yet approved or cleared by the [place] FDA and has been authorized for detection and/or diagnosis of SARS-CoV-2 by FDA under an Emergency Use Authorization (EUA). This EUA will remain in effect (meaning this test can be used) for the duration of the COVID-19 declaration under Section 564(b)(1) of the Act, 21 U.S.C. section 360bbb-3(b)(1), unless the authorization is terminated or revoked.  Performed at [facility], [address] [place], [place] [address]      Radiology Studies: DG Swallowing Func-Speech Pathology  Result Date: 04/22/2020 Objective Swallowing Evaluation: Type of Study: MBS-Modified Barium Swallow  Study  Patient Details Name: Ashok CordiaWilliam L Heward Jr. MRN: [id number] Date of Birth: 10-May-1950 Today's Date: 04/22/2020 Time: SLP Start Time (ACUTE ONLY): 0948 -SLP Stop Time (ACUTE ONLY): 1005 SLP Time Calculation (min) (ACUTE ONLY): 17 min Past Medical History: Past Medical History: Diagnosis Date . Acute blood loss anemia  . Acute embolic stroke (HCC)  . Acute lower UTI  .  AMS (altered mental status) 06/05/2019 . Cerebral edema (HCC) 04/04/2018 . Diabetes 1.5, managed as type 2 (HCC)  . Diabetes mellitus without complication (HCC)  . High cholesterol  . High glucose   Diabetes . History of CVA with residual deficit  . History of Dysphagia, post-stroke  . Hypertension  . Ischemic stroke (HCC) 03/05/2019 . Labile blood glucose  . 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 Renal insufficiency 04/04/2018 . Seizures (HCC)  . Stroke (HCC)  . UTI (urinary tract infection) 03/05/2019 Past Surgical History: Past Surgical History: Procedure Laterality Date . IR PATIENT EVAL TECH 0-60 MINS  03/30/2018 . LOOP RECORDER INSERTION N/A 03/08/2019  Procedure: LOOP RECORDER INSERTION;  Surgeon: Regan Lemming, MD;  Location: [facility] INVASIVE CV LAB;  Service: Cardiovascular;  Laterality: N/A; . TEE WITHOUT CARDIOVERSION N/A 04/02/2018  Procedure: TRANSESOPHAGEAL ECHOCARDIOGRAM (TEE);  Surgeon: [name], MD;  Location: [facility] ENDOSCOPY;  Service: Cardiovascular;  Laterality: N/A; HPI: Patient is a 69 y.o. male with PMH: HTN, HLD, DM-2, multiple CVA with residual right-sided hemiparesis, seizures, who was found by daughter pushing into the wall, acting more drowsy than normal and with elevated blood pressure. EMS was called for code stroke. At baseline, patient has right-sided deficits with no movement of right extremities, contracture of right UE, aphasia: "yes/no" are his only words and he uses gestures to communicate. He is wheelchair bound with assistance to stand for toileting.  Subjective: alert, cooperative, pleasant  Assessment / Plan / Recommendation CHL IP CLINICAL IMPRESSIONS 04/22/2020 Clinical Impression Pt presents with a moderate oropharyngeal dysphagia.  Pt's oral phase is prolonged due to decreased posterior propulsion of boluses towards the pharynx.  Pt's swallow response is triggered consistently at the pyriform sinuses which could be attributable to sensory deficits post CVA.  He silently aspirated thin liquids both before and during the swallow.  He also appeared to have deep penetration leading to eventual aspiration of residual purees from the vallecula following administration of barium tablet whole in applesauce although the actual event was not visualized but rather noted upon return to pharynx following esophageal sweep.   As a result, pt may be advanced to dys 1, nectar thick liquids with full supervision for use of the following swallow precautions: avoid mixed consistencies, small bites and sips, upright for meals and for 30-60 min after, meds crushed in puree.   SLP Visit Diagnosis Dysphagia, oropharyngeal phase (R13.12) Attention and concentration deficit following -- Frontal lobe and executive function deficit following -- Impact on safety and function Moderate aspiration risk   CHL IP TREATMENT RECOMMENDATION 04/22/2020 Treatment Recommendations Therapy as outlined in treatment plan below   Prognosis 04/22/2020 Prognosis for Safe Diet Advancement Good Barriers to Reach Goals Time post onset;Severity of deficits Barriers/Prognosis Comment -- CHL IP DIET RECOMMENDATION 04/22/2020 SLP Diet Recommendations Dysphagia 1 (Puree) solids;Nectar thick liquid Liquid Administration via Cup;No straw Medication Administration Crushed with puree Compensations Minimize environmental distractions;Slow rate;Small sips/bites Postural Changes Remain semi-upright after after feeds/meals (Comment);Seated upright at 90 degrees   CHL IP OTHER RECOMMENDATIONS 04/22/2020 Recommended Consults -- Oral Care Recommendations Oral  care BID Other Recommendations Order thickener from pharmacy;Clarify dietary restrictions;Prohibited food (jello, ice cream, thin soups);Remove water pitcher   CHL IP FOLLOW UP RECOMMENDATIONS 04/22/2020 Follow up Recommendations Skilled Nursing facility;24 hour supervision/assistance   CHL IP FREQUENCY AND DURATION 04/22/2020 Speech Therapy Frequency (ACUTE ONLY) min 2x/week Treatment Duration 2 weeks      CHL IP ORAL PHASE 04/22/2020 Oral Phase Impaired Oral - Pudding Teaspoon --  Oral - Pudding Cup -- Oral - Honey Teaspoon -- Oral - Honey Cup Reduced posterior propulsion;Delayed oral transit Oral - Nectar Teaspoon -- Oral - Nectar Cup Reduced posterior propulsion;Delayed oral transit Oral - Nectar Straw -- Oral - Thin Teaspoon -- Oral - Thin Cup Reduced posterior propulsion;Delayed oral transit Oral - Thin Straw Reduced posterior propulsion;Delayed oral transit Oral - Puree Reduced posterior propulsion;Delayed oral transit Oral - Mech Soft Reduced posterior propulsion;Delayed oral transit Oral - Regular -- Oral - Multi-Consistency -- Oral - Pill -- Oral Phase - Comment --  CHL IP PHARYNGEAL PHASE 04/22/2020 Pharyngeal Phase Impaired Pharyngeal- Pudding Teaspoon -- Pharyngeal -- Pharyngeal- Pudding Cup -- Pharyngeal -- Pharyngeal- Honey Teaspoon -- Pharyngeal -- Pharyngeal- Honey Cup Delayed swallow initiation-pyriform sinuses Pharyngeal -- Pharyngeal- Nectar Teaspoon -- Pharyngeal -- Pharyngeal- Nectar Cup Delayed swallow initiation-pyriform sinuses Pharyngeal -- Pharyngeal- Nectar Straw -- Pharyngeal -- Pharyngeal- Thin Teaspoon -- Pharyngeal -- Pharyngeal- Thin Cup Delayed swallow initiation-pyriform sinuses;Penetration/Aspiration before swallow;Penetration/Aspiration during swallow Pharyngeal Material enters airway, passes BELOW cords without attempt by patient to eject out (silent aspiration) Pharyngeal- Thin Straw Delayed swallow initiation-pyriform sinuses Pharyngeal -- Pharyngeal- Puree Delayed swallow  initiation-pyriform sinuses;Pharyngeal residue - valleculae Pharyngeal -- Pharyngeal- Mechanical Soft Delayed swallow initiation-pyriform sinuses;Pharyngeal residue - valleculae Pharyngeal -- Pharyngeal- Regular -- Pharyngeal -- Pharyngeal- Multi-consistency -- Pharyngeal -- Pharyngeal- Pill Delayed swallow initiation-pyriform sinuses;Pharyngeal residue - valleculae;Penetration/Apiration after swallow Pharyngeal -- Pharyngeal Comment --  CHL IP CERVICAL ESOPHAGEAL PHASE 11/22/2019 Cervical Esophageal Phase WFL Pudding Teaspoon -- Pudding Cup -- Honey Teaspoon -- Honey Cup -- Nectar Teaspoon -- Nectar Cup -- Nectar Straw -- Thin Teaspoon -- Thin Cup -- Thin Straw -- Puree -- Mechanical Soft -- Regular -- Multi-consistency -- Pill -- Cervical Esophageal Comment -- Maryjane Hurter 04/22/2020, 10:39 AM              DG Swallowing Func-Speech Pathology  Final Result    MR BRAIN WO CONTRAST  Final Result    DG CHEST PORT 1 VIEW  Final Result    CT HEAD CODE STROKE WO CONTRAST  Final Result      Scheduled Meds: .  stroke: mapping our early stages of recovery book   Does not apply Once  . amLODipine  10 mg Oral Daily  . aspirin EC  325 mg Oral q morning - 10a  . atorvastatin  40 mg Oral Q0600  . carvedilol  12.5 mg Oral BID WC  . clopidogrel  75 mg Oral Daily  . enoxaparin (LOVENOX) injection  40 mg Subcutaneous Daily  . indapamide  1.25 mg Oral Daily  . insulin aspart  0-15 Units Subcutaneous TID AC & HS  . lamoTRIgine  150 mg Oral BID  . lisinopril  20 mg Oral BID  . midodrine  5 mg Oral TID WC  . multivitamin with minerals  1 tablet Oral Daily  . tamsulosin  0.4 mg Oral QPC supper   PRN Meds: acetaminophen **OR** acetaminophen (TYLENOL) oral liquid 160 mg/5 mL **OR** acetaminophen, hydrALAZINE, traZODone Continuous Infusions: . sodium chloride 50 mL/hr at 04/23/20 1156     LOS: 2 days  Time spent: Greater than 50% of the 35 minute visit was spent in counseling/coordination of care for  the patient as laid out in the A&P.   Lewie Chamber, MD [facility] 04/23/2020, 2:15 PM

## 2020-04-24 LAB — GLUCOSE, CAPILLARY
Glucose-Capillary: 134 mg/dL — ABNORMAL HIGH (ref 70–99)
Glucose-Capillary: 230 mg/dL — ABNORMAL HIGH (ref 70–99)
Glucose-Capillary: 175 mg/dL — ABNORMAL HIGH (ref 70–99)
Glucose-Capillary: 193 mg/dL — ABNORMAL HIGH (ref 70–99)

## 2020-04-24 LAB — CBC WITH DIFFERENTIAL/PLATELET
Abs Immature Granulocytes: 0.01 10*3/uL (ref 0.00–0.07)
Basophils Absolute: 0 10*3/uL (ref 0.0–0.1)
Basophils Relative: 0 %
Eosinophils Absolute: 0.1 10*3/uL (ref 0.0–0.5)
Eosinophils Relative: 1 %
HCT: 35.8 % — ABNORMAL LOW (ref 39.0–52.0)
Hemoglobin: 11.4 g/dL — ABNORMAL LOW (ref 13.0–17.0)
Immature Granulocytes: 0 %
Lymphocytes Relative: 27 %
Lymphs Abs: 1.6 10*3/uL (ref 0.7–4.0)
MCH: 29.8 pg (ref 26.0–34.0)
MCHC: 31.8 g/dL (ref 30.0–36.0)
MCV: 93.7 fL (ref 80.0–100.0)
Monocytes Absolute: 0.7 10*3/uL (ref 0.1–1.0)
Monocytes Relative: 11 %
Neutro Abs: 3.7 10*3/uL (ref 1.7–7.7)
Neutrophils Relative %: 61 %
Platelets: 198 10*3/uL (ref 150–400)
RBC: 3.82 MIL/uL — ABNORMAL LOW (ref 4.22–5.81)
RDW: 14 % (ref 11.5–15.5)
WBC: 6.1 10*3/uL (ref 4.0–10.5)
nRBC: 0 % (ref 0.0–0.2)

## 2020-04-24 LAB — BASIC METABOLIC PANEL
Anion gap: 10 (ref 5–15)
BUN: 27 mg/dL — ABNORMAL HIGH (ref 8–23)
CO2: 25 mmol/L (ref 22–32)
Calcium: 9.5 mg/dL (ref 8.9–10.3)
Chloride: 105 mmol/L (ref 98–111)
Creatinine, Ser: 1.67 mg/dL — ABNORMAL HIGH (ref 0.61–1.24)
GFR, Estimated: 44 mL/min — ABNORMAL LOW (ref >60)
Glucose, Bld: 188 mg/dL — ABNORMAL HIGH (ref 70–99)
Potassium: 3.5 mmol/L (ref 3.5–5.1)
Sodium: 140 mmol/L (ref 135–145)

## 2020-04-24 LAB — MAGNESIUM: Magnesium: 2.1 mg/dL (ref 1.7–2.4)

## 2020-04-24 MED ORDER — HYDRALAZINE HCL 25 MG PO TABS: 25.0000 mg | ORAL_TABLET | ORAL | Status: DC | PRN

## 2020-04-24 MED ORDER — GLIPIZIDE 5 MG PO TABS
10.0000 mg | ORAL_TABLET | Freq: Two times a day (BID) | ORAL | Status: DC
  Administered 2020-04-24 – 2020-04-27 (×6): 10 mg via ORAL
  Filled 2020-04-24 (×6): qty 2

## 2020-04-24 MED ORDER — LACTATED RINGERS IV SOLN: INTRAVENOUS | Status: DC

## 2020-04-24 MED ORDER — LINAGLIPTIN 5 MG PO TABS
5.0000 mg | ORAL_TABLET | Freq: Every day | ORAL | Status: DC
  Administered 2020-04-24 – 2020-04-27 (×4): 5 mg via ORAL
  Filled 2020-04-24 (×5): qty 1

## 2020-04-24 MED ORDER — LABETALOL HCL 5 MG/ML IV SOLN: 10.0000 mg | INTRAVENOUS | Status: DC | PRN

## 2020-04-24 NOTE — Progress Notes (Signed)
PROGRESS NOTE    Douglas [name].   JKD:[id number]  DOB: 1950/05/27  DOA: 04/20/2020     3  PCP: Ngetich, Donalee Citrin, NP  CC: drowsy  Hospital Course: 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 His baseline is that he says "yes" to any question asked and has severe right-sided hemiplegia. However, he is able to follow some commands at home such as handing his daughter a drink for her to open or other simple gestures. There was concern for possible stroke on admission and neurology was also consulted. He underwent stroke work-up with CT head and MRI brain as well as EEG. No acute findings were seen on MRI, just old chronic and large left MCA/PCA infarcts.  EEG revealed cortical dysfunction in the left hemisphere with slowing noted which was considered to have possibly been associated with a post ictal state. No active seizure activity noted on EEG. Etiology of his presentation was considered likely due to a probable underlying seizure at home prior to admission but no acute stroke. He was recommended by neurology to continue on previous regimen of seizure medication and secondary stroke prevention medications. He was also evaluated by physical therapy and his daughter was hopeful for patient going to rehab at discharge as she states that he regained some function and came home stronger after rehab.   Interval History:  No events overnight. Awake in bed this am. Using left arm easily. Didn't follow as many commands today.   Old records reviewed in assessment of this patient  ROS: Constitutional: negative for chills and fevers, Respiratory: negative for cough, Cardiovascular: negative for chest pain and Gastrointestinal: negative for abdominal pain  Assessment & Plan: * Acute metabolic encephalopathy-resolved as of  04/22/2020 -Likely postictal from seizure. No active seizure on EEG -Neurology has seen patient -Continue current seizure regimen -Baseline mentation is answering "yes" to all questions which he is perceivably back to baseline at this point  Seizure disorder (HCC) -Slowing seen on EEG but no active seizure activity -Continue Lamictal 150 mg twice daily. If witnessed seizure, per neurology would then increase to 200 mg twice daily  History of CVA (cerebrovascular accident) -No new strokes seen on MRI brain -Large left MCA/PCA old strokes noted -Continue aspirin, Plavix, statin  Type 2 diabetes mellitus with peripheral neuropathy (HCC) -Continue SSI and CBG monitoring -Last A1c 6.5% on 03/06/2020 -Levemir on hold - resume glipizide; CrCl borderline for metformin (possibly lower clearance in past given on "allergy" list); at risk for hypoglycemia as age advances - add [place]  Physical debility -Patient is from private residence with 24/7 care. PT recommending to consider SNF -We will follow-up with SW/CM and family discussions  Hyperlipidemia -Continue statin  BPH with obstruction/lower urinary tract symptoms -Continue Flomax   Antimicrobials:   DVT prophylaxis: Lovenox Code Status: Full Family Communication: None present Disposition Plan: Status is: Inpatient  Remains inpatient appropriate because:Altered mental status, Unsafe d/c plan and Inpatient level of care appropriate due to severity of illness   Dispo: The patient is from: Home              Anticipated d/c is to: SNF              Anticipated d/c date is: when bed avail              Patient  currently is medically stable to d/c.  Objective: Blood pressure (!) 145/86, pulse 78, temperature 98.6 F (37 C), temperature source Oral, resp. rate 16, height 6\' 1"  (1.854 m), weight 72 kg, SpO2 100 %.  Examination: General appearance: Chronically ill elderly gentleman laying in bed in no distress Head: Normocephalic,  without obvious abnormality, atraumatic Eyes: EOMI Lungs: clear to auscultation bilaterally Heart: regular rate and rhythm and S1, S2 normal Abdomen: normal findings: bowel sounds normal and soft, non-tender Extremities: Contracted right wrist. No edema Skin: mobility and turgor normal Neurologic: Left upper extremity moving easily and purposefully  Consultants:   Neurology -signed off  Procedures:   EEG  Data Reviewed: I have personally reviewed following labs and imaging studies Results for orders placed or performed during the hospital encounter of 04/20/20 (from the past 24 hour(s))  Glucose, capillary     Status: Abnormal   Collection Time: 04/23/20  5:12 PM  Result Value Ref Range   Glucose-Capillary 269 (H) 70 - 99 mg/dL   Comment 1 Notify RN    Comment 2 Document in Chart   Glucose, capillary     Status: Abnormal   Collection Time: 04/23/20 10:03 PM  Result Value Ref Range   Glucose-Capillary 331 (H) 70 - 99 mg/dL  Basic metabolic panel     Status: Abnormal   Collection Time: 04/24/20  2:36 AM  Result Value Ref Range   Sodium 140 135 - 145 mmol/L   Potassium 3.5 3.5 - 5.1 mmol/L   Chloride 105 98 - 111 mmol/L   CO2 25 22 - 32 mmol/L   Glucose, Bld 188 (H) 70 - 99 mg/dL   BUN 27 (H) 8 - 23 mg/dL   Creatinine, Ser 04/26/20 (H) 0.61 - 1.24 mg/dL   Calcium 9.5 8.9 - 9.62 mg/dL   GFR, Estimated 44 (L) >60 mL/min   Anion gap 10 5 - 15  CBC with Differential/Platelet     Status: Abnormal   Collection Time: 04/24/20  2:36 AM  Result Value Ref Range   WBC 6.1 4.0 - 10.5 K/uL   RBC 3.82 (L) 4.22 - 5.81 MIL/uL   Hemoglobin 11.4 (L) 13.0 - 17.0 g/dL   HCT 04/26/20 (L) 79.8 - 92.1 %   MCV 93.7 80.0 - 100.0 fL   MCH 29.8 26.0 - 34.0 pg   MCHC 31.8 30.0 - 36.0 g/dL   RDW 19.4 17.4 - 08.1 %   Platelets 198 150 - 400 K/uL   nRBC 0.0 0.0 - 0.2 %   Neutrophils Relative % 61 %   Neutro Abs 3.7 1.7 - 7.7 K/uL   Lymphocytes Relative 27 %   Lymphs Abs 1.6 0.7 - 4.0 K/uL    Monocytes Relative 11 %   Monocytes Absolute 0.7 0.1 - 1.0 K/uL   Eosinophils Relative 1 %   Eosinophils Absolute 0.1 0.0 - 0.5 K/uL   Basophils Relative 0 %   Basophils Absolute 0.0 0.0 - 0.1 K/uL   Immature Granulocytes 0 %   Abs Immature Granulocytes 0.01 0.00 - 0.07 K/uL  Magnesium     Status: None   Collection Time: 04/24/20  2:36 AM  Result Value Ref Range   Magnesium 2.1 1.7 - 2.4 mg/dL  Glucose, capillary     Status: Abnormal   Collection Time: 04/24/20  6:19 AM  Result Value Ref Range   Glucose-Capillary 134 (H) 70 - 99 mg/dL   Comment 1 Notify RN    Comment 2 Document in Chart  Glucose, capillary     Status: Abnormal   Collection Time: 04/24/20 10:54 AM  Result Value Ref Range   Glucose-Capillary 230 (H) 70 - 99 mg/dL    Recent Results (from the past 240 hour(s))  Resp Panel by RT-PCR (Flu A&B, Covid) Nasopharyngeal Swab     Status: None   Collection Time: 04/20/20 12:28 PM   Specimen: Nasopharyngeal Swab; Nasopharyngeal(NP) swabs in vial transport medium  Result Value Ref Range Status   SARS Coronavirus 2 by RT PCR NEGATIVE NEGATIVE Final    Comment: (NOTE) SARS-CoV-2 target nucleic acids are NOT DETECTED.  The SARS-CoV-2 RNA is generally detectable in upper respiratory specimens during the acute phase of infection. The lowest concentration of SARS-CoV-2 viral copies this assay can detect is 138 copies/mL. A negative result does not preclude SARS-Cov-2 infection and should not be used as the sole basis for treatment or other patient management decisions. A negative result may occur with  improper specimen collection/handling, submission of specimen other than nasopharyngeal swab, presence of viral mutation(s) within the areas targeted by this assay, and inadequate number of viral copies(<138 copies/mL). A negative result must be combined with clinical observations, patient history, and epidemiological information. The expected result is Negative.  Fact Sheet  for Patients:  BloggerCourse.comhttps://www.fda.gov/media/152166/download  Fact Sheet for Healthcare Providers:  SeriousBroker.ithttps://www.fda.gov/media/152162/download  This test is no t yet approved or cleared by the [place] FDA and  has been authorized for detection and/or diagnosis of SARS-CoV-2 by FDA under an Emergency Use Authorization (EUA). This EUA will remain  in effect (meaning this test can be used) for the duration of the COVID-19 declaration under Section 564(b)(1) of the Act, 21 U.S.C.section 360bbb-3(b)(1), unless the authorization is terminated  or revoked sooner.       Influenza A by PCR NEGATIVE NEGATIVE Final   Influenza B by PCR NEGATIVE NEGATIVE Final    Comment: (NOTE) The Xpert Xpress SARS-CoV-2/FLU/RSV plus assay is intended as an aid in the diagnosis of influenza from Nasopharyngeal swab specimens and should not be used as a sole basis for treatment. Nasal washings and aspirates are unacceptable for Xpert Xpress SARS-CoV-2/FLU/RSV testing.  Fact Sheet for Patients: BloggerCourse.comhttps://www.fda.gov/media/152166/download  Fact Sheet for Healthcare Providers: SeriousBroker.ithttps://www.fda.gov/media/152162/download  This test is not yet approved or cleared by the [place] FDA and has been authorized for detection and/or diagnosis of SARS-CoV-2 by FDA under an Emergency Use Authorization (EUA). This EUA will remain in effect (meaning this test can be used) for the duration of the COVID-19 declaration under Section 564(b)(1) of the Act, 21 U.S.C. section 360bbb-3(b)(1), unless the authorization is terminated or revoked.  Performed at [facility], [address] [place], [place] [address]      Radiology Studies: No results found. DG Swallowing Func-Speech Pathology  Final Result    MR BRAIN WO CONTRAST  Final Result    DG CHEST PORT 1 VIEW  Final Result    CT HEAD CODE STROKE WO CONTRAST  Final Result      Scheduled Meds: .  stroke: mapping our early stages of recovery book    Does not apply Once  . amLODipine  10 mg Oral Daily  . aspirin EC  325 mg Oral q morning - 10a  . atorvastatin  40 mg Oral Q0600  . carvedilol  12.5 mg Oral BID WC  . clopidogrel  75 mg Oral Daily  . enoxaparin (LOVENOX) injection  40 mg Subcutaneous Daily  . glipiZIDE  10 mg Oral BID  WC  . indapamide  1.25 mg Oral Daily  . insulin aspart  0-15 Units Subcutaneous TID AC & HS  . lamoTRIgine  150 mg Oral BID  . linagliptin  5 mg Oral Daily  . lisinopril  20 mg Oral BID  . midodrine  5 mg Oral TID WC  . multivitamin with minerals  1 tablet Oral Daily  . tamsulosin  0.4 mg Oral QPC supper   PRN Meds: acetaminophen **OR** acetaminophen (TYLENOL) oral liquid 160 mg/5 mL **OR** acetaminophen, hydrALAZINE, traZODone Continuous Infusions: . sodium chloride Stopped (04/24/20 0809)  . lactated ringers 75 mL/hr at 04/24/20 0809     LOS: 3 days  Time spent: Greater than 50% of the 35 minute visit was spent in counseling/coordination of care for the patient as laid out in the A&P.   Lewie Chamber, MD [facility] 04/24/2020, 12:16 PM

## 2020-04-24 NOTE — Progress Notes (Signed)
Inpatient Diabetes Program Recommendations  AACE/ADA: New Consensus Statement on Inpatient Glycemic Control (2015)  Target Ranges:  Prepandial:   less than 140 mg/dL      Peak postprandial:   less than 180 mg/dL (1-2 hours)      Critically ill patients:  140 - 180 mg/dL   Lab Results  Component Value Date   GLUCAP 134 (H) 04/24/2020   HGBA1C 6.6 (H) 04/22/2020    Review of Glycemic Control  Diabetes history: DM2 Outpatient Diabetes medications: Levemir 16 units daily, Novolog 2-15 units QID, Glipizide 10 mg BID Current orders for Inpatient glycemic control: Novolog 0-15 units TID with meals and at bedtime  Inpatient Diabetes Program Recommendations:    Insulin: Please consider:  -Levemir 5 units Q24H - Novolog 3 units TID with meals for meal coverage if patient eats at least 50% of meals -Decrease hs Novolog correction to 0-5 units Secure chat sent to Dr. Latanya Maudlin.  Thank you, Douglas [name]. Kierstyn Baranowski, RN, MSN, CDE  Diabetes Coordinator Inpatient Glycemic Control Team Team Pager [phone] (8am-5pm) 04/24/2020 10:36 AM

## 2020-04-24 NOTE — Progress Notes (Signed)
Physical Therapy Treatment Patient Details Name: Douglas [name]. MRN: [id number] DOB: 1951/02/05 Today's Date: 04/24/2020    History of Present Illness 69 yo male presenting with acute metabolic encephalopathy with concern for seizure vs. CVA. Previous hospital admission 10/2019 with AMS and concern for seizure vs. underlying CVA. 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    PT Comments    Pt tolerates treatment well but continues to require significant physical assistance for all functional mobility. Pt continues to have difficulty maintaining sitting balance and is unable to tolerate challenges to balance. Pt is only intermittently able to follow commands due to aphasia. Pt will benefit from continued acute PT POC to improve functional mobility quality and to reduce caregiver burden. PT continues to recommend SNF placement.   Follow Up Recommendations  SNF;Supervision/Assistance - 24 hour     Equipment Recommendations  None recommended by PT    Recommendations for Other Services       Precautions / Restrictions Precautions Precautions: Fall Precaution Comments: Dense R hemiplegia with contractures Restrictions Weight Bearing Restrictions: No    Mobility  Bed Mobility Overal bed mobility: Needs Assistance Bed Mobility: Rolling;Supine to Sit Rolling: Max assist   Supine to sit: Total assist     General bed mobility comments: pt rolls to both sides with use of railing and maxA, improved ability to roll to R side  Transfers Overall transfer level: Needs assistance Equipment used: 1 person hand held assist Transfers: Squat Pivot Transfers     Squat pivot transfers: Max assist;From elevated surface     General transfer comment: PT provides L knee block and BUE support  Ambulation/Gait                 Stairs             Wheelchair Mobility    Modified Rankin (Stroke Patients Only)        Balance Overall balance assessment: Needs assistance Sitting-balance support: Single extremity supported;Feet supported Sitting balance-Leahy Scale: Poor Sitting balance - Comments: reliant on LUE support, min-modA Postural control: Right lateral lean Standing balance support: Bilateral upper extremity supported Standing balance-Leahy Scale: Zero Standing balance comment: maxA with BUE support and L knee block                            Cognition Arousal/Alertness: Awake/alert Behavior During Therapy: Flat affect Overall Cognitive Status: No family/caregiver present to determine baseline cognitive functioning                                 General Comments: aphasic at baseline, responds with head nods, intermittently points during session to pick out objects      Exercises      General Comments General comments (skin integrity, edema, etc.): VSS on RA      Pertinent Vitals/Pain Pain Assessment: Faces Faces Pain Scale: No hurt    Home Living                      Prior Function            PT Goals (current goals can now be found in the care plan section) Acute Rehab PT Goals Patient Stated Goal: Unable Progress towards PT goals: Progressing toward goals    Frequency    Min 2X/week  PT Plan Current plan remains appropriate    Co-evaluation              AM-PAC PT "6 Clicks" Mobility   Outcome Measure  Help needed turning from your back to your side while in a flat bed without using bedrails?: A Lot Help needed moving from lying on your back to sitting on the side of a flat bed without using bedrails?: A Lot Help needed moving to and from a bed to a chair (including a wheelchair)?: A Lot Help needed standing up from a chair using your arms (e.g., wheelchair or bedside chair)?: Total Help needed to walk in hospital room?: Total Help needed climbing 3-5 steps with a railing? : Total 6 Click Score: 9    End  of Session   Activity Tolerance: Patient tolerated treatment well Patient left: in chair;with call bell/phone within reach;with chair alarm set Nurse Communication: Mobility status;Need for lift equipment PT Visit Diagnosis: Unsteadiness on feet (R26.81);Other abnormalities of gait and mobility (R26.89);Difficulty in walking, not elsewhere classified (R26.2);Hemiplegia and hemiparesis Hemiplegia - Right/Left: Right Hemiplegia - caused by: Cerebral infarction     Time: 1505-6979 PT Time Calculation (min) (ACUTE ONLY): 38 min  Charges:  $Therapeutic Activity: 38-52 mins                     Arlyss Gandy, PT, DPT Acute Rehabilitation Pager: [phone]    Arlyss Gandy 04/24/2020, 4:34 PM

## 2020-04-25 LAB — GLUCOSE, CAPILLARY
Glucose-Capillary: 110 mg/dL — ABNORMAL HIGH (ref 70–99)
Glucose-Capillary: 197 mg/dL — ABNORMAL HIGH (ref 70–99)
Glucose-Capillary: 254 mg/dL — ABNORMAL HIGH (ref 70–99)
Glucose-Capillary: 212 mg/dL — ABNORMAL HIGH (ref 70–99)

## 2020-04-25 NOTE — Plan of Care (Signed)
  Problem: Education: Goal: Knowledge of General Education information will improve Description: Including pain rating scale, medication(s)/side effects and non-pharmacologic comfort measures Outcome: Progressing   Problem: Health Behavior/Discharge Planning: Goal: Ability to manage health-related needs will improve Outcome: Progressing   Problem: Clinical Measurements: Goal: Ability to maintain clinical measurements within normal limits will improve Outcome: Progressing Goal: Will remain free from infection Outcome: Progressing Goal: Diagnostic test results will improve Outcome: Progressing Goal: Respiratory complications will improve Outcome: Progressing Goal: Cardiovascular complication will be avoided Outcome: Progressing   Problem: Activity: Goal: Risk for activity intolerance will decrease Outcome: Progressing   Problem: Nutrition: Goal: Adequate nutrition will be maintained Outcome: Progressing   Problem: Pain Managment: Goal: General experience of comfort will improve Outcome: Progressing   Problem: Safety: Goal: Ability to remain free from injury will improve Outcome: Progressing   Problem: Education: Goal: Knowledge of disease or condition will improve Outcome: Progressing Goal: Knowledge of secondary prevention will improve Outcome: Progressing Goal: Knowledge of patient specific risk factors addressed and post discharge goals established will improve Outcome: Progressing   Problem: Education: Goal: Expressions of having a comfortable level of knowledge regarding the disease process will increase Outcome: Progressing   Problem: Coping: Goal: Ability to adjust to condition or change in health will improve Outcome: Progressing Goal: Ability to identify appropriate support needs will improve Outcome: Progressing   Problem: Health Behavior/Discharge Planning: Goal: Compliance with prescribed medication regimen will improve Outcome: Progressing    Problem: Medication: Goal: Risk for medication side effects will decrease Outcome: Progressing   Problem: Clinical Measurements: Goal: Complications related to the disease process, condition or treatment will be avoided or minimized Outcome: Progressing Goal: Diagnostic test results will improve Outcome: Progressing   Problem: Safety: Goal: Verbalization of understanding the information provided will improve Outcome: Progressing   Problem: Self-Concept: Goal: Level of anxiety will decrease Outcome: Progressing Goal: Ability to verbalize feelings about condition will improve Outcome: Progressing   

## 2020-04-25 NOTE — Progress Notes (Signed)
PROGRESS NOTE    Douglas [name].   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 His baseline is that he says "yes" to any question asked and has severe right-sided hemiplegia. However, he is able to follow some commands at home such as handing his daughter a drink for her to open or other simple gestures. There was concern for possible stroke on admission and neurology was also consulted. He underwent stroke work-up with CT head and MRI brain as well as EEG. No acute findings were seen on MRI, just old chronic and large left MCA/PCA infarcts.  EEG revealed cortical dysfunction in the left hemisphere with slowing noted which was considered to have possibly been associated with a post ictal state. No active seizure activity noted on EEG. Etiology of his presentation was considered likely due to a probable underlying seizure at home prior to admission but no acute stroke. He was recommended by neurology to continue on previous regimen of seizure medication and secondary stroke prevention medications. He was also evaluated by physical therapy and his daughter was hopeful for patient going to rehab at discharge as she states that he regained some function and came home stronger after rehab.   Interval History:  No events overnight. He is resting in bed in no distress. Could not follow.   Old records reviewed in assessment of this patient  ROS: Constitutional: negative for chills and fevers, Respiratory: negative for cough, Cardiovascular: negative for chest pain and Gastrointestinal: negative for abdominal pain  Assessment & Plan: * Acute metabolic encephalopathy-resolved as of 04/22/2020 -Likely postictal from seizure.  No active seizure on EEG -Neurology has seen patient -Continue current seizure regimen -Baseline mentation is answering "yes" to all questions which he is perceivably back to baseline at this point  Seizure disorder (HCC) -Slowing seen on EEG but no active seizure activity -Continue Lamictal 150 mg twice daily. If witnessed seizure, per neurology would then increase to 200 mg twice daily  History of CVA (cerebrovascular accident) -No new strokes seen on MRI brain -Large left MCA/PCA old strokes noted -Continue aspirin, Plavix, statin  Type 2 diabetes mellitus with peripheral neuropathy (HCC) -Continue SSI and CBG monitoring -Last A1c 6.5% on 03/06/2020 -Levemir on hold - resume glipizide; CrCl borderline for metformin (possibly lower clearance in past given on "allergy" list); at risk for hypoglycemia as age advances - add [place]  Physical debility -Patient is from private residence with 24/7 care. PT recommending to consider SNF -We will follow-up with SW/CM and family discussions  Hyperlipidemia -Continue statin  BPH with obstruction/lower urinary tract symptoms -Continue Flomax   Antimicrobials:   DVT prophylaxis: Lovenox Code Status: Full Family Communication: None present Disposition Plan: Status is: Inpatient  Remains inpatient appropriate because:Altered mental status, Unsafe d/c plan and Inpatient level of care appropriate due to severity of illness  Dispo: The patient is from: Home              Anticipated d/c is to: SNF              Anticipated d/c date is: when bed avail              Patient currently is medically stable to  d/c.  Objective: Blood pressure 135/73, pulse 81, temperature 98.2 F (36.8 C), temperature source Oral, resp. rate 18, height 6\' 1"  (1.854 m), weight 72 kg, SpO2 99 %.  Examination: General appearance: Chronically ill elderly gentleman laying in bed in no distress Head: Normocephalic, without obvious abnormality, atraumatic Eyes:  EOMI Lungs: clear to auscultation bilaterally Heart: regular rate and rhythm and S1, S2 normal Abdomen: normal findings: bowel sounds normal and soft, non-tender Extremities: Contracted right wrist. No edema Skin: mobility and turgor normal Neurologic: Left upper extremity moving easily and purposefully  Consultants:   Neurology -signed off  Procedures:   EEG  Data Reviewed: I have personally reviewed following labs and imaging studies Results for orders placed or performed during the hospital encounter of 04/20/20 (from the past 24 hour(s))  Glucose, capillary     Status: Abnormal   Collection Time: 04/24/20  4:45 PM  Result Value Ref Range   Glucose-Capillary 175 (H) 70 - 99 mg/dL   Comment 1 Notify RN    Comment 2 Document in Chart   Glucose, capillary     Status: Abnormal   Collection Time: 04/24/20  9:15 PM  Result Value Ref Range   Glucose-Capillary 193 (H) 70 - 99 mg/dL   Comment 1 Notify RN    Comment 2 Document in Chart   Glucose, capillary     Status: Abnormal   Collection Time: 04/25/20  5:32 AM  Result Value Ref Range   Glucose-Capillary 110 (H) 70 - 99 mg/dL    Recent Results (from the past 240 hour(s))  Resp Panel by RT-PCR (Flu A&B, Covid) Nasopharyngeal Swab     Status: None   Collection Time: 04/20/20 12:28 PM   Specimen: Nasopharyngeal Swab; Nasopharyngeal(NP) swabs in vial transport medium  Result Value Ref Range Status   SARS Coronavirus 2 by RT PCR NEGATIVE NEGATIVE Final    Comment: (NOTE) SARS-CoV-2 target nucleic acids are NOT DETECTED.  The SARS-CoV-2 RNA is generally detectable in upper respiratory specimens during the acute phase of infection. The lowest concentration of SARS-CoV-2 viral copies this assay can detect is 138 copies/mL. A negative result does not preclude SARS-Cov-2 infection and should not be used as the sole basis for treatment or other patient management decisions. A negative result may occur with  improper specimen  collection/handling, submission of specimen other than nasopharyngeal swab, presence of viral mutation(s) within the areas targeted by this assay, and inadequate number of viral copies(<138 copies/mL). A negative result must be combined with clinical observations, patient history, and epidemiological information. The expected result is Negative.  Fact Sheet for Patients:  04/22/20  Fact Sheet for Healthcare Providers:  BloggerCourse.com  This test is no t yet approved or cleared by the [place] FDA and  has been authorized for detection and/or diagnosis of SARS-CoV-2 by FDA under an Emergency Use Authorization (EUA). This EUA will remain  in effect (meaning this test can be used) for the duration of the COVID-19 declaration under Section 564(b)(1) of the Act, 21 U.S.C.section 360bbb-3(b)(1), unless the authorization is terminated  or revoked sooner.       Influenza A by PCR NEGATIVE NEGATIVE Final   Influenza B by PCR NEGATIVE NEGATIVE Final    Comment: (NOTE) The Xpert Xpress SARS-CoV-2/FLU/RSV plus assay is intended as an aid in the diagnosis of influenza from Nasopharyngeal swab specimens and should not be used as a sole basis for treatment. Nasal washings and aspirates are unacceptable for Xpert Xpress SARS-CoV-2/FLU/RSV testing.  Fact  Sheet for Patients: BloggerCourse.com  Fact Sheet for Healthcare Providers: SeriousBroker.it  This test is not yet approved or cleared by the [place] FDA and has been authorized for detection and/or diagnosis of SARS-CoV-2 by FDA under an Emergency Use Authorization (EUA). This EUA will remain in effect (meaning this test can be used) for the duration of the COVID-19 declaration under Section 564(b)(1) of the Act, 21 U.S.C. section 360bbb-3(b)(1), unless the authorization is terminated or revoked.  Performed at [facility], [address] [place], [place] [address]      Radiology Studies: No results found. DG Swallowing Func-Speech Pathology  Final Result    MR BRAIN WO CONTRAST  Final Result    DG CHEST PORT 1 VIEW  Final Result    CT HEAD CODE STROKE WO CONTRAST  Final Result      Scheduled Meds: .  stroke: mapping our early stages of recovery book   Does not apply Once  . amLODipine  10 mg Oral Daily  . aspirin EC  325 mg Oral q morning - 10a  . atorvastatin  40 mg Oral Q0600  . carvedilol  12.5 mg Oral BID WC  . clopidogrel  75 mg Oral Daily  . enoxaparin (LOVENOX) injection  40 mg Subcutaneous Daily  . glipiZIDE  10 mg Oral BID WC  . insulin aspart  0-15 Units Subcutaneous TID AC & HS  . lamoTRIgine  150 mg Oral BID  . linagliptin  5 mg Oral Daily  . multivitamin with minerals  1 tablet Oral Daily  . tamsulosin  0.4 mg Oral QPC supper   PRN Meds: acetaminophen **OR** acetaminophen (TYLENOL) oral liquid 160 mg/5 mL **OR** acetaminophen, hydrALAZINE, labetalol, traZODone Continuous Infusions: . sodium chloride Stopped (04/24/20 0809)  . lactated ringers 75 mL/hr at 04/24/20 2026     LOS: 4 days  Time spent: Greater than 50% of the 35 minute visit was spent in counseling/coordination of care for the patient as laid out in the A&P.   Lewie Chamber, MD [facility] 04/25/2020, 11:00 AM

## 2020-04-26 ENCOUNTER — Other Ambulatory Visit: Payer: Self-pay | Admitting: Family

## 2020-04-26 DIAGNOSIS — E1159 Type 2 diabetes mellitus with other circulatory complications: Secondary | ICD-10-CM

## 2020-04-26 DIAGNOSIS — I6990 Unspecified sequelae of unspecified cerebrovascular disease: Secondary | ICD-10-CM

## 2020-04-26 LAB — CBC WITH DIFFERENTIAL/PLATELET
Abs Immature Granulocytes: 0.04 10*3/uL (ref 0.00–0.07)
Basophils Absolute: 0 10*3/uL (ref 0.0–0.1)
Basophils Relative: 1 %
Eosinophils Absolute: 0.3 10*3/uL (ref 0.0–0.5)
Eosinophils Relative: 4 %
HCT: 32.8 % — ABNORMAL LOW (ref 39.0–52.0)
Hemoglobin: 10.3 g/dL — ABNORMAL LOW (ref 13.0–17.0)
Immature Granulocytes: 1 %
Lymphocytes Relative: 23 %
Lymphs Abs: 1.5 10*3/uL (ref 0.7–4.0)
MCH: 29.4 pg (ref 26.0–34.0)
MCHC: 31.4 g/dL (ref 30.0–36.0)
MCV: 93.7 fL (ref 80.0–100.0)
Monocytes Absolute: 0.7 10*3/uL (ref 0.1–1.0)
Monocytes Relative: 11 %
Neutro Abs: 4 10*3/uL (ref 1.7–7.7)
Neutrophils Relative %: 60 %
Platelets: 195 10*3/uL (ref 150–400)
RBC: 3.5 MIL/uL — ABNORMAL LOW (ref 4.22–5.81)
RDW: 14.4 % (ref 11.5–15.5)
WBC: 6.4 10*3/uL (ref 4.0–10.5)
nRBC: 0 % (ref 0.0–0.2)

## 2020-04-26 LAB — GLUCOSE, CAPILLARY
Glucose-Capillary: 239 mg/dL — ABNORMAL HIGH (ref 70–99)
Glucose-Capillary: 241 mg/dL — ABNORMAL HIGH (ref 70–99)
Glucose-Capillary: 200 mg/dL — ABNORMAL HIGH (ref 70–99)
Glucose-Capillary: 269 mg/dL — ABNORMAL HIGH (ref 70–99)

## 2020-04-26 LAB — MAGNESIUM: Magnesium: 1.7 mg/dL (ref 1.7–2.4)

## 2020-04-26 LAB — BASIC METABOLIC PANEL
Anion gap: 11 (ref 5–15)
BUN: 17 mg/dL (ref 8–23)
CO2: 27 mmol/L (ref 22–32)
Calcium: 9.1 mg/dL (ref 8.9–10.3)
Chloride: 101 mmol/L (ref 98–111)
Creatinine, Ser: 1.1 mg/dL (ref 0.61–1.24)
GFR, Estimated: >60 mL/min (ref >60)
Glucose, Bld: 232 mg/dL — ABNORMAL HIGH (ref 70–99)
Potassium: 3.4 mmol/L — ABNORMAL LOW (ref 3.5–5.1)
Sodium: 139 mmol/L (ref 135–145)

## 2020-04-26 MED ORDER — POTASSIUM CHLORIDE 20 MEQ PO PACK
40.0000 meq | PACK | Freq: Once | ORAL | Status: AC
  Administered 2020-04-26: 40 meq via ORAL
  Filled 2020-04-26: qty 2

## 2020-04-26 NOTE — Plan of Care (Signed)
  Problem: Education: Goal: Knowledge of General Education information will improve Description: Including pain rating scale, medication(s)/side effects and non-pharmacologic comfort measures Outcome: Progressing   Problem: Health Behavior/Discharge Planning: Goal: Ability to manage health-related needs will improve Outcome: Progressing   Problem: Clinical Measurements: Goal: Ability to maintain clinical measurements within normal limits will improve Outcome: Progressing Goal: Will remain free from infection Outcome: Progressing Goal: Diagnostic test results will improve Outcome: Progressing Goal: Respiratory complications will improve Outcome: Progressing Goal: Cardiovascular complication will be avoided Outcome: Progressing   Problem: Activity: Goal: Risk for activity intolerance will decrease Outcome: Progressing   Problem: Nutrition: Goal: Adequate nutrition will be maintained Outcome: Progressing   Problem: Pain Managment: Goal: General experience of comfort will improve Outcome: Progressing   Problem: Safety: Goal: Ability to remain free from injury will improve Outcome: Progressing   Problem: Education: Goal: Knowledge of disease or condition will improve Outcome: Progressing Goal: Knowledge of secondary prevention will improve Outcome: Progressing Goal: Knowledge of patient specific risk factors addressed and post discharge goals established will improve Outcome: Progressing   Problem: Education: Goal: Expressions of having a comfortable level of knowledge regarding the disease process will increase Outcome: Progressing   Problem: Coping: Goal: Ability to adjust to condition or change in health will improve Outcome: Progressing Goal: Ability to identify appropriate support needs will improve Outcome: Progressing   Problem: Health Behavior/Discharge Planning: Goal: Compliance with prescribed medication regimen will improve Outcome: Progressing    Problem: Medication: Goal: Risk for medication side effects will decrease Outcome: Progressing   Problem: Clinical Measurements: Goal: Complications related to the disease process, condition or treatment will be avoided or minimized Outcome: Progressing Goal: Diagnostic test results will improve Outcome: Progressing   Problem: Safety: Goal: Verbalization of understanding the information provided will improve Outcome: Progressing   Problem: Self-Concept: Goal: Level of anxiety will decrease Outcome: Progressing Goal: Ability to verbalize feelings about condition will improve Outcome: Progressing   

## 2020-04-26 NOTE — TOC Progression Note (Addendum)
Transition of Care (TOC) - Progression Note    Patient Details  Name: Douglas [name]. MRN: [id number] Date of Birth: 22-May-1950  Transition of Care [facility]) CM/SW Contact  Carley Hammed, [place] Phone Number: 04/26/2020, 1:13 PM  Clinical Narrative:    CSW spoke with pt's daughter, Cala Bradford. Bed offers were given. She said that [facility] will likely be the choice if no one else responds. She asked that we follow up with [facility] and [facility]. A message was left for [facility], but was unable to leave one for [facility]. TOC will continue to follow for DC planning.  2:20 CSW received call from [facility] that they are not accepting anyone until the first of the year. Expected Discharge Plan: Skilled Nursing Facility Barriers to Discharge: Continued Medical Work up,SNF Pending bed offer,Insurance Authorization  Expected Discharge Plan and Services Expected Discharge Plan: Skilled Nursing Facility In-house Referral: Clinical Social Work     Living arrangements for the past 2 months: Single Family Home                                       Social Determinants of Health (SDOH) Interventions    Readmission Risk Interventions No flowsheet data found.

## 2020-04-26 NOTE — Progress Notes (Signed)
PROGRESS NOTE    Douglas [name].   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 His baseline is that he says "yes" to any question asked and has severe right-sided hemiplegia. However, he is able to follow some commands at home such as handing his daughter a drink for her to open or other simple gestures. There was concern for possible stroke on admission and neurology was also consulted. He underwent stroke work-up with CT head and MRI brain as well as EEG. No acute findings were seen on MRI, just old chronic and large left MCA/PCA infarcts.  EEG revealed cortical dysfunction in the left hemisphere with slowing noted which was considered to have possibly been associated with a post ictal state. No active seizure activity noted on EEG. Etiology of his presentation was considered likely due to a probable underlying seizure at home prior to admission but no acute stroke. He was recommended by neurology to continue on previous regimen of seizure medication and secondary stroke prevention medications. He was also evaluated by physical therapy and his daughter was hopeful for patient going to rehab at discharge as she states that he regained some function and came home stronger after rehab.   Interval History:  No events overnight. He is resting in bed in no distress.  Followed commands better today.   Old records reviewed in assessment of this patient  ROS: Constitutional: negative for chills and fevers, Respiratory: negative for cough, Cardiovascular: negative for chest pain and Gastrointestinal: negative for abdominal pain  Assessment & Plan: * Acute metabolic encephalopathy-resolved as of 04/22/2020 -Likely postictal  from seizure. No active seizure on EEG -Neurology has seen patient -Continue current seizure regimen -Baseline mentation is answering "yes" to all questions which he is perceivably back to baseline at this point  Seizure disorder (HCC) -Slowing seen on EEG but no active seizure activity -Continue Lamictal 150 mg twice daily. If witnessed seizure, per neurology would then increase to 200 mg twice daily  History of CVA (cerebrovascular accident) -No new strokes seen on MRI brain -Large left MCA/PCA old strokes noted -Continue aspirin, Plavix, statin  Type 2 diabetes mellitus with peripheral neuropathy (HCC) -Continue SSI and CBG monitoring -Last A1c 6.5% on 03/06/2020 -Levemir on hold - resume glipizide; CrCl borderline for metformin (possibly lower clearance in past given on "allergy" list); at risk for hypoglycemia as age advances - add [place]  Physical debility -Patient is from private residence with 24/7 care. PT recommending to consider SNF -We will follow-up with SW/CM and family discussions  Hyperlipidemia -Continue statin  BPH with obstruction/lower urinary tract symptoms -Continue Flomax   Antimicrobials:   DVT prophylaxis: Lovenox Code Status: Full Family Communication: None present Disposition Plan: Status is: Inpatient  Remains inpatient appropriate because:Altered mental status, Unsafe d/c plan and Inpatient level of care appropriate due to severity of illness  Dispo: The patient is from: Home              Anticipated d/c is to: SNF              Anticipated d/c date is: when bed avail              Patient currently is medically  stable to d/c.  Objective: Blood pressure (!) 144/77, pulse 88, temperature 98.5 F (36.9 C), temperature source Oral, resp. rate 18, height 6\' 1"  (1.854 m), weight 72 kg, SpO2 100 %.  Examination: General appearance: Chronically ill elderly gentleman laying in bed in no distress Head: Normocephalic, without obvious abnormality,  atraumatic Eyes: EOMI Lungs: clear to auscultation bilaterally Heart: regular rate and rhythm and S1, S2 normal Abdomen: normal findings: bowel sounds normal and soft, non-tender Extremities: Contracted right wrist. No edema Skin: mobility and turgor normal Neurologic: Left upper extremity moving easily and purposefully  Consultants:   Neurology -signed off  Procedures:   EEG  Data Reviewed: I have personally reviewed following labs and imaging studies Results for orders placed or performed during the hospital encounter of 04/20/20 (from the past 24 hour(s))  Glucose, capillary     Status: Abnormal   Collection Time: 04/25/20 12:56 PM  Result Value Ref Range   Glucose-Capillary 197 (H) 70 - 99 mg/dL  Glucose, capillary     Status: Abnormal   Collection Time: 04/25/20  3:58 PM  Result Value Ref Range   Glucose-Capillary 254 (H) 70 - 99 mg/dL  Glucose, capillary     Status: Abnormal   Collection Time: 04/25/20  9:17 PM  Result Value Ref Range   Glucose-Capillary 212 (H) 70 - 99 mg/dL  Basic metabolic panel     Status: Abnormal   Collection Time: 04/26/20  2:28 AM  Result Value Ref Range   Sodium 139 135 - 145 mmol/L   Potassium 3.4 (L) 3.5 - 5.1 mmol/L   Chloride 101 98 - 111 mmol/L   CO2 27 22 - 32 mmol/L   Glucose, Bld 232 (H) 70 - 99 mg/dL   BUN 17 8 - 23 mg/dL   Creatinine, Ser 04/28/20 0.61 - 1.24 mg/dL   Calcium 9.1 8.9 - 8.41 mg/dL   GFR, Estimated 66.0 >63 mL/min   Anion gap 11 5 - 15  CBC with Differential/Platelet     Status: Abnormal   Collection Time: 04/26/20  2:28 AM  Result Value Ref Range   WBC 6.4 4.0 - 10.5 K/uL   RBC 3.50 (L) 4.22 - 5.81 MIL/uL   Hemoglobin 10.3 (L) 13.0 - 17.0 g/dL   HCT 04/28/20 (L) 60.1 - 09.3 %   MCV 93.7 80.0 - 100.0 fL   MCH 29.4 26.0 - 34.0 pg   MCHC 31.4 30.0 - 36.0 g/dL   RDW 23.5 57.3 - 22.0 %   Platelets 195 150 - 400 K/uL   nRBC 0.0 0.0 - 0.2 %   Neutrophils Relative % 60 %   Neutro Abs 4.0 1.7 - 7.7 K/uL   Lymphocytes  Relative 23 %   Lymphs Abs 1.5 0.7 - 4.0 K/uL   Monocytes Relative 11 %   Monocytes Absolute 0.7 0.1 - 1.0 K/uL   Eosinophils Relative 4 %   Eosinophils Absolute 0.3 0.0 - 0.5 K/uL   Basophils Relative 1 %   Basophils Absolute 0.0 0.0 - 0.1 K/uL   Immature Granulocytes 1 %   Abs Immature Granulocytes 0.04 0.00 - 0.07 K/uL  Magnesium     Status: None   Collection Time: 04/26/20  2:28 AM  Result Value Ref Range   Magnesium 1.7 1.7 - 2.4 mg/dL  Glucose, capillary     Status: Abnormal   Collection Time: 04/26/20  6:08 AM  Result Value Ref Range   Glucose-Capillary 239 (H) 70 - 99 mg/dL    Recent  Results (from the past 240 hour(s))  Resp Panel by RT-PCR (Flu A&B, Covid) Nasopharyngeal Swab     Status: None   Collection Time: 04/20/20 12:28 PM   Specimen: Nasopharyngeal Swab; Nasopharyngeal(NP) swabs in vial transport medium  Result Value Ref Range Status   SARS Coronavirus 2 by RT PCR NEGATIVE NEGATIVE Final    Comment: (NOTE) SARS-CoV-2 target nucleic acids are NOT DETECTED.  The SARS-CoV-2 RNA is generally detectable in upper respiratory specimens during the acute phase of infection. The lowest concentration of SARS-CoV-2 viral copies this assay can detect is 138 copies/mL. A negative result does not preclude SARS-Cov-2 infection and should not be used as the sole basis for treatment or other patient management decisions. A negative result may occur with  improper specimen collection/handling, submission of specimen other than nasopharyngeal swab, presence of viral mutation(s) within the areas targeted by this assay, and inadequate number of viral copies(<138 copies/mL). A negative result must be combined with clinical observations, patient history, and epidemiological information. The expected result is Negative.  Fact Sheet for Patients:  BloggerCourse.com  Fact Sheet for Healthcare Providers:  SeriousBroker.it  This test  is no t yet approved or cleared by the [place] FDA and  has been authorized for detection and/or diagnosis of SARS-CoV-2 by FDA under an Emergency Use Authorization (EUA). This EUA will remain  in effect (meaning this test can be used) for the duration of the COVID-19 declaration under Section 564(b)(1) of the Act, 21 U.S.C.section 360bbb-3(b)(1), unless the authorization is terminated  or revoked sooner.       Influenza A by PCR NEGATIVE NEGATIVE Final   Influenza B by PCR NEGATIVE NEGATIVE Final    Comment: (NOTE) The Xpert Xpress SARS-CoV-2/FLU/RSV plus assay is intended as an aid in the diagnosis of influenza from Nasopharyngeal swab specimens and should not be used as a sole basis for treatment. Nasal washings and aspirates are unacceptable for Xpert Xpress SARS-CoV-2/FLU/RSV testing.  Fact Sheet for Patients: BloggerCourse.com  Fact Sheet for Healthcare Providers: SeriousBroker.it  This test is not yet approved or cleared by the [place] FDA and has been authorized for detection and/or diagnosis of SARS-CoV-2 by FDA under an Emergency Use Authorization (EUA). This EUA will remain in effect (meaning this test can be used) for the duration of the COVID-19 declaration under Section 564(b)(1) of the Act, 21 U.S.C. section 360bbb-3(b)(1), unless the authorization is terminated or revoked.  Performed at [facility], [address] [place], [place] [address]      Radiology Studies: No results found. DG Swallowing Func-Speech Pathology  Final Result    MR BRAIN WO CONTRAST  Final Result    DG CHEST PORT 1 VIEW  Final Result    CT HEAD CODE STROKE WO CONTRAST  Final Result      Scheduled Meds: .  stroke: mapping our early stages of recovery book   Does not apply Once  . amLODipine  10 mg Oral Daily  . aspirin EC  325 mg Oral q morning - 10a  . atorvastatin  40 mg Oral Q0600  . carvedilol  12.5  mg Oral BID WC  . clopidogrel  75 mg Oral Daily  . enoxaparin (LOVENOX) injection  40 mg Subcutaneous Daily  . glipiZIDE  10 mg Oral BID WC  . insulin aspart  0-15 Units Subcutaneous TID AC & HS  . lamoTRIgine  150 mg Oral BID  . linagliptin  5 mg Oral Daily  . multivitamin with  minerals  1 tablet Oral Daily  . tamsulosin  0.4 mg Oral QPC supper   PRN Meds: acetaminophen **OR** acetaminophen (TYLENOL) oral liquid 160 mg/5 mL **OR** acetaminophen, hydrALAZINE, labetalol, traZODone Continuous Infusions: . sodium chloride Stopped (04/24/20 0809)  . lactated ringers 75 mL/hr at 04/25/20 2151     LOS: 5 days  Time spent: Greater than 50% of the 35 minute visit was spent in counseling/coordination of care for the patient as laid out in the A&P.   Lewie Chamber, MD [facility] 04/26/2020, 11:15 AM

## 2020-04-27 DIAGNOSIS — L89152 Pressure ulcer of sacral region, stage 2: Secondary | ICD-10-CM | POA: Insufficient documentation

## 2020-04-27 LAB — RESP PANEL BY RT-PCR (FLU A&B, COVID) ARPGX2
Influenza A by PCR: NEGATIVE
Influenza B by PCR: NEGATIVE
SARS Coronavirus 2 by RT PCR: NEGATIVE

## 2020-04-27 LAB — GLUCOSE, CAPILLARY
Glucose-Capillary: 127 mg/dL — ABNORMAL HIGH (ref 70–99)
Glucose-Capillary: 249 mg/dL — ABNORMAL HIGH (ref 70–99)

## 2020-04-27 MED ORDER — INSULIN DETEMIR 100 UNIT/ML ~~LOC~~ SOLN: 5.0000 [IU] | Freq: Every day | SUBCUTANEOUS | 0 refills | Status: DC

## 2020-04-27 MED ORDER — SENNOSIDES-DOCUSATE SODIUM 8.6-50 MG PO TABS: 1.0000 | ORAL_TABLET | Freq: Every evening | ORAL | 0 refills | Status: AC | PRN

## 2020-04-27 MED ORDER — LINAGLIPTIN 5 MG PO TABS: 5.0000 mg | ORAL_TABLET | Freq: Every day | ORAL | Status: DC

## 2020-04-27 NOTE — Assessment & Plan Note (Addendum)
-   wash sacral wound daily - apply tegaderm dressing after cleansing  - needs ongoing wound care evaluation - continue scheduled turning in bed

## 2020-04-27 NOTE — TOC Transition Note (Signed)
Transition of Care [facility]) - CM/SW Discharge Note   Patient Details  Name: Douglas [name]. MRN: [id number] Date of Birth: Feb 02, 1951  [facility]) CM/SW Contact:  Lorri Frederick, LCSW Phone Number: 04/27/2020, 2:21 PM   Clinical Narrative:  Pt discharging to [facility],  Room assignment available when RN calls report to [phone].     Final next level of care: Skilled Nursing Facility Barriers to Discharge: Barriers Resolved   Patient Goals and CMS Choice Patient states their goals for this hospitalization and ongoing recovery are:: Per pt's daughter " for him to get stronger and not choke and to be able to get out more words". CMS Medicare.gov Compare Post Acute Care list provided to:: Patient Represenative (must comment) (Medicare.gov website was given to pt's daughter by phone) Choice offered to / list presented to : Adult Children  Discharge Placement              Patient chooses bed at: [facility] Patient to be transferred to facility by: [facility] Name of family member notified: [name], daughter Patient and family notified of of transfer: 04/27/20  Discharge Plan and Services In-house Referral: Clinical Social Work                                   Social Determinants of Health (SDOH) Interventions     Readmission Risk Interventions No flowsheet data found.

## 2020-04-27 NOTE — Progress Notes (Signed)
Report called to [facility], [phone].

## 2020-04-27 NOTE — Plan of Care (Signed)
  Problem: Education: Goal: Knowledge of General Education information will improve Description: Including pain rating scale, medication(s)/side effects and non-pharmacologic comfort measures Outcome: Progressing   Problem: Health Behavior/Discharge Planning: Goal: Ability to manage health-related needs will improve Outcome: Progressing   Problem: Clinical Measurements: Goal: Ability to maintain clinical measurements within normal limits will improve Outcome: Progressing Goal: Will remain free from infection Outcome: Progressing Goal: Diagnostic test results will improve Outcome: Progressing Goal: Respiratory complications will improve Outcome: Progressing Goal: Cardiovascular complication will be avoided Outcome: Progressing   Problem: Activity: Goal: Risk for activity intolerance will decrease Outcome: Progressing   Problem: Nutrition: Goal: Adequate nutrition will be maintained Outcome: Progressing   Problem: Pain Managment: Goal: General experience of comfort will improve Outcome: Progressing   Problem: Safety: Goal: Ability to remain free from injury will improve Outcome: Progressing   Problem: Education: Goal: Knowledge of disease or condition will improve Outcome: Progressing Goal: Knowledge of secondary prevention will improve Outcome: Progressing Goal: Knowledge of patient specific risk factors addressed and post discharge goals established will improve Outcome: Progressing   Problem: Education: Goal: Expressions of having a comfortable level of knowledge regarding the disease process will increase Outcome: Progressing   Problem: Coping: Goal: Ability to adjust to condition or change in health will improve Outcome: Progressing Goal: Ability to identify appropriate support needs will improve Outcome: Progressing   Problem: Health Behavior/Discharge Planning: Goal: Compliance with prescribed medication regimen will improve Outcome: Progressing    Problem: Medication: Goal: Risk for medication side effects will decrease Outcome: Progressing   Problem: Clinical Measurements: Goal: Complications related to the disease process, condition or treatment will be avoided or minimized Outcome: Progressing Goal: Diagnostic test results will improve Outcome: Progressing   Problem: Safety: Goal: Verbalization of understanding the information provided will improve Outcome: Progressing   Problem: Self-Concept: Goal: Level of anxiety will decrease Outcome: Progressing Goal: Ability to verbalize feelings about condition will improve Outcome: Progressing

## 2020-04-27 NOTE — Progress Notes (Signed)
Inpatient Diabetes Program Recommendations  AACE/ADA: New Consensus Statement on Inpatient Glycemic Control (2015)  Target Ranges:  Prepandial:   less than 140 mg/dL      Peak postprandial:   less than 180 mg/dL (1-2 hours)      Critically ill patients:  140 - 180 mg/dL   Lab Results  Component Value Date   GLUCAP 249 (H) 04/27/2020   HGBA1C 6.6 (H) 04/22/2020    Review of Glycemic Control Results for Douglas [name], Douglas [name] (MRN [id number]) as of 04/27/2020 12:31  Ref. Range 04/26/2020 12:23 04/26/2020 16:42 04/26/2020 21:06 04/27/2020 05:39 04/27/2020 10:56  Glucose-Capillary Latest Ref Range: 70 - 99 mg/dL 628 (H) 366 (H) 294 (H) 127 (H) 249 (H)  Diabetes history:DM2 Outpatient Diabetes medications:Levemir 16 units daily, Novolog 2-15 units QID, Glipizide 10 mg BID Current orders for Inpatient glycemic control:Novolog 0-15 units TID with meals and at bedtime, Glucotrol 10 mg bid, Tradjenta 5 mg daily Inpatient Diabetes Program Recommendations:    May consider restarting Levemir 8 units daily (1/2 of home dose).   Currently ordered moderate correction at HS.  Consider changing correction at bedtime to HS correction (less insulin starting at 201 mg/dL).  Thanks,  Beryl Meager, RN, BC-ADM Inpatient Diabetes Coordinator Pager [phone] (8a-5p)

## 2020-04-27 NOTE — TOC Progression Note (Signed)
Transition of Care (TOC) - Progression Note    Patient Details  Name: Douglas [name]. MRN: [id number] Date of Birth: 1950/10/03  Transition [facility]) CM/SW Contact  Lorri Frederick, LCSW Phone Number: 04/27/2020, 11:27 AM  Clinical Narrative:   CSW attempted to call [facility], unable to get through, spoke with pt daughter Douglas [name] who had a different number.  CSW spoke with person at [phone], who reports Douglas [name] has been closed for 18 months, facility is [facility], [facility].  They do have SNF section, but they are not taking patients from the community because they remain full serving their own residents.  CSW spoke with daughter Douglas [name] who is agreeable to moving forward with [facility] at this time.     Expected Discharge Plan: Skilled Nursing Facility Barriers to Discharge: Continued Medical Work up,SNF Pending bed offer,Insurance Authorization  Expected Discharge Plan and Services Expected Discharge Plan: Skilled Nursing Facility In-house Referral: Clinical Social Work     Living arrangements for the past 2 months: Single Family Home                                       Social Determinants of Health (SDOH) Interventions    Readmission Risk Interventions No flowsheet data found.

## 2020-04-27 NOTE — Discharge Summary (Signed)
Physician Discharge Summary   Douglas [name]. XBJ:[id number] DOB: [birth year] DOA: 04/20/2020  PCP: Caesar Bookman, NP  Admit date: 04/20/2020 Discharge date: 04/27/2020  Admitted From: home Disposition:  rehab Discharging physician: Lewie Chamber, MD  Recommendations for Outpatient Follow-up:  1. May need Levemir adjustment more  2. If any further physical decline, would consider involving palliative care for goals of care discussions   Patient discharged to rehab in Discharge Condition: stable CODE STATUS: Full Diet recommendation:  Diet Orders (From admission, onward)    Start     Ordered   04/27/20 0000  Diet - low sodium heart healthy        04/27/20 1400   04/27/20 0000  Diet Carb Modified        04/27/20 1400   04/22/20 1049  DIET - DYS 1 Room service appropriate? Yes; Fluid consistency: Nectar Thick  Diet effective now       Comments: Meds crushed in puree, full supervision with meals  Question Answer Comment  Room service appropriate? Yes   Fluid consistency: Nectar Thick      04/22/20 1048          Hospital Course: 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 His baseline is that he says "yes" to any question asked and has severe right-sided hemiplegia. However, he is able to follow some commands at home such as handing his daughter a drink for her to open or other simple gestures. There was concern for possible stroke on admission and neurology was also consulted. He underwent stroke work-up with CT head and MRI brain as well as EEG. No acute findings were seen on MRI, just old chronic and large left MCA/PCA infarcts.  EEG revealed cortical dysfunction in the left hemisphere with slowing noted which was considered to have possibly been associated with a post ictal state. No active seizure activity noted  on EEG. Etiology of his presentation was considered likely due to a probable underlying seizure at home prior to admission but no acute stroke. He was recommended by neurology to continue on previous regimen of seizure medication and secondary stroke prevention medications. He was also evaluated by physical therapy and his daughter was hopeful for patient going to rehab at discharge as she states that he regained some function and came home stronger after rehab.   * Acute metabolic encephalopathy-resolved as of 04/22/2020 -Likely postictal from seizure. No active seizure on EEG -Neurology has seen patient -Continue current seizure regimen -Baseline mentation is answering "yes" to all questions which he is perceivably back to baseline at this point  Seizure disorder (HCC) -Slowing seen on EEG but no active seizure activity -Continue Lamictal 150 mg twice daily. If witnessed seizure, per neurology would then increase to 200 mg twice daily  History of CVA (cerebrovascular accident) -No new strokes seen on MRI brain -Large left MCA/PCA old strokes noted -Continue aspirin, Plavix, statin  Type 2 diabetes mellitus with peripheral neuropathy (HCC) -Continue SSI and CBG monitoring -Last A1c 6.5% on 03/06/2020 -Levemir on hold; resume at 5 units daily at discharge. Will need to be adjusted further  - resume glipizide; CrCl borderline for metformin (possibly lower clearance in past given on "allergy" list); at risk for hypoglycemia as age advances - add [place]  Decubitus ulcer of sacral region, stage 2 (HCC) - wash sacral wound  daily - apply tegaderm dressing after cleansing  - needs ongoing wound care evaluation - continue scheduled turning in bed  Physical debility -Patient is from private residence with 24/7 care. PT recommending to consider SNF - bed accepted at [facility]  Hyperlipidemia -Continue statin  BPH with obstruction/lower urinary tract symptoms -Continue  Flomax    Principal Diagnosis: Acute metabolic encephalopathy  Discharge Diagnoses: Active Hospital Problems   Diagnosis Date Noted  . Seizure disorder (HCC) 07/21/2019    Priority: Medium  . History of CVA (cerebrovascular accident)     Priority: Medium  . Type 2 diabetes mellitus with peripheral neuropathy (HCC)     Priority: Low  . Decubitus ulcer of sacral region, stage 2 (HCC) 04/27/2020  . Protein-calorie malnutrition, severe 04/23/2020  . Physical debility 04/22/2020  . Hyperlipidemia 04/20/2020  . 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 Acute metabolic encephalopathy 04/20/2020 04/22/2020    Priority: High    Discharge Instructions    Diet - low sodium heart healthy   Complete by: As directed    Diet Carb Modified   Complete by: As directed    Discharge wound care:   Complete by: As directed    Wash sacral wound daily and cover with Tegaderm dressing   Increase activity slowly   Complete by: As directed      Allergies as of 04/27/2020      Reactions   Ciprofloxacin Anaphylaxis   Keppra [levetiracetam] Other (See Comments)   Makes the patient very agitated and "not like himself"   Avocado Nausea And Vomiting   Metformin And Related Other (See Comments)   "Renal issue"      Medication List    STOP taking these medications   bisacodyl 10 MG suppository Commonly known as: DULCOLAX   indapamide 1.25 MG tablet Commonly known as: LOZOL   lisinopril 20 MG tablet Commonly known as: ZESTRIL   midodrine 5 MG tablet Commonly known as: PROAMATINE   OMEGA-3 FISH OIL PO   pantoprazole 40 MG tablet Commonly known as: PROTONIX     TAKE these medications   amLODipine 10 MG tablet Commonly known as: NORVASC Take 1 tablet (10 mg total) by mouth daily.   ascorbic acid 500 MG  tablet Commonly known as: VITAMIN C Take 1 tablet (500 mg total) by mouth daily as needed (if cold-like symptoms are present).   aspirin EC 325 MG tablet Take 325 mg by mouth every morning.   atorvastatin 40 MG tablet Commonly known as: LIPITOR Take 1 tablet (40 mg total) by mouth daily at 6 (six) AM.   baclofen 20 MG tablet Commonly known as: LIORESAL Take 1 tablet (20 mg total) by mouth 2 (two) times daily.   carvedilol 12.5 MG tablet Commonly known as: COREG TAKE 1 TABLET (12.5 MG TOTAL) BY MOUTH 2 (TWO) TIMES DAILY WITH A MEAL.   clopidogrel 75 MG tablet Commonly known as: PLAVIX Take 1 tablet (75 mg total) by mouth daily.   glipiZIDE 10 MG tablet Commonly known as: GLUCOTROL Take 1 tablet (10 mg total) by mouth 2 (two) times daily with a meal.   insulin aspart 100 UNIT/ML injection Commonly known as: NovoLOG Inject 2-15 Units into the skin 4 (four) times daily -  before meals and at bedtime. SSI:  121-150 = 2 units; 151-200 =  3 units, 201-250 = 5 units, 251-300 = 8 units, 301-350 = 11 units, 351-300 = 15 units, greater than 400 = 15 units and call MD   insulin detemir 100 UNIT/ML injection Commonly known as: LEVEMIR Inject 0.05 mLs (5 Units total) into the skin daily. If CBG >125 (per daughter) What changed: how much to take   lamoTRIgine 150 MG tablet Commonly known as: LAMICTAL Take 1 tablet (150 mg total) by mouth 2 (two) times daily.   linagliptin 5 MG Tabs tablet Commonly known as: TRADJENTA Take 1 tablet (5 mg total) by mouth daily. Start taking on: April 28, 2020   liver oil-zinc oxide 40 % ointment Commonly known as: DESITIN Apply 1 application topically 2 (two) times daily as needed for irritation.   senna-docusate 8.6-50 MG tablet Commonly known as: Senokot-S Take 1 tablet by mouth at bedtime as needed for mild constipation. What changed:   when to take this  reasons to take this   tamsulosin 0.4 MG Caps capsule Commonly known as:  FLOMAX Take 1 capsule (0.4 mg total) by mouth daily after supper.   traZODone 50 MG tablet Commonly known as: DESYREL TAKE 1/2-1 TABLET BY MOUTH AT BEDTIME AS NEEDED FOR SLEEP. What changed: See the new instructions.   Vitamin D3 25 MCG tablet Commonly known as: Vitamin D Take 0.5 tablets (500 Units total) by mouth daily.            Discharge Care Instructions  (From admission, onward)         Start     Ordered   04/27/20 0000  Discharge wound care:       Comments: Wash sacral wound daily and cover with Tegaderm dressing   04/27/20 1400          Allergies  Allergen Reactions  . Ciprofloxacin Anaphylaxis  . Keppra [Levetiracetam] Other (See Comments)    Makes the patient very agitated and "not like himself"  . Avocado Nausea And Vomiting  . Metformin And Related Other (See Comments)    "Renal issue"    Consultations: Neuro  Discharge Exam: BP 135/72 (BP Location: Left Arm)   Pulse 88   Temp 98.7 F (37.1 C) (Oral)   Resp 16   Ht 6\' 1"  (1.854 m)   Wt 72 kg   SpO2 99%   BMI 20.95 kg/m  General appearance: Chronically ill elderly gentleman laying in bed in no distress Head: Normocephalic, without obvious abnormality, atraumatic Eyes: EOMI Lungs: clear to auscultation bilaterally Heart: regular rate and rhythm and S1, S2 normal Abdomen: normal findings: bowel sounds normal and soft, non-tender Extremities: Contracted right wrist. No edema Skin: mobility and turgor normal Neurologic: Left upper extremity moving easily and purposefully; moves left lower extremity freely as well  The results of significant diagnostics from this hospitalization (including imaging, microbiology, ancillary and laboratory) are listed below for reference.   Microbiology: Recent Results (from the past 240 hour(s))  Resp Panel by RT-PCR (Flu A&B, Covid) Nasopharyngeal Swab     Status: None   Collection Time: 04/20/20 12:28 PM   Specimen: Nasopharyngeal Swab; Nasopharyngeal(NP)  swabs in vial transport medium  Result Value Ref Range Status   SARS Coronavirus 2 by RT PCR NEGATIVE NEGATIVE Final    Comment: (NOTE) SARS-CoV-2 target nucleic acids are NOT DETECTED.  The SARS-CoV-2 RNA is generally detectable in upper respiratory specimens during the acute phase of infection. The lowest concentration of SARS-CoV-2 viral copies this assay can detect is 138 copies/mL. A negative  result does not preclude SARS-Cov-2 infection and should not be used as the sole basis for treatment or other patient management decisions. A negative result may occur with  improper specimen collection/handling, submission of specimen other than nasopharyngeal swab, presence of viral mutation(s) within the areas targeted by this assay, and inadequate number of viral copies(<138 copies/mL). A negative result must be combined with clinical observations, patient history, and epidemiological information. The expected result is Negative.  Fact Sheet for Patients:  BloggerCourse.com  Fact Sheet for Healthcare Providers:  SeriousBroker.it  This test is no t yet approved or cleared by the [place] FDA and  has been authorized for detection and/or diagnosis of SARS-CoV-2 by FDA under an Emergency Use Authorization (EUA). This EUA will remain  in effect (meaning this test can be used) for the duration of the COVID-19 declaration under Section 564(b)(1) of the Act, 21 U.S.C.section 360bbb-3(b)(1), unless the authorization is terminated  or revoked sooner.       Influenza A by PCR NEGATIVE NEGATIVE Final   Influenza B by PCR NEGATIVE NEGATIVE Final    Comment: (NOTE) The Xpert Xpress SARS-CoV-2/FLU/RSV plus assay is intended as an aid in the diagnosis of influenza from Nasopharyngeal swab specimens and should not be used as a sole basis for treatment. Nasal washings and aspirates are unacceptable for Xpert Xpress  SARS-CoV-2/FLU/RSV testing.  Fact Sheet for Patients: BloggerCourse.com  Fact Sheet for Healthcare Providers: SeriousBroker.it  This test is not yet approved or cleared by the [place] FDA and has been authorized for detection and/or diagnosis of SARS-CoV-2 by FDA under an Emergency Use Authorization (EUA). This EUA will remain in effect (meaning this test can be used) for the duration of the COVID-19 declaration under Section 564(b)(1) of the Act, 21 U.S.C. section 360bbb-3(b)(1), unless the authorization is terminated or revoked.  Performed at [facility], [address], [place], [place] [address]   Resp Panel by RT-PCR (Flu A&B, Covid) Nasopharyngeal Swab     Status: None   Collection Time: 04/27/20 10:59 AM   Specimen: Nasopharyngeal Swab; Nasopharyngeal(NP) swabs in vial transport medium  Result Value Ref Range Status   SARS Coronavirus 2 by RT PCR NEGATIVE NEGATIVE Final    Comment: (NOTE) SARS-CoV-2 target nucleic acids are NOT DETECTED.  The SARS-CoV-2 RNA is generally detectable in upper respiratory specimens during the acute phase of infection. The lowest concentration of SARS-CoV-2 viral copies this assay can detect is 138 copies/mL. A negative result does not preclude SARS-Cov-2 infection and should not be used as the sole basis for treatment or other patient management decisions. A negative result may occur with  improper specimen collection/handling, submission of specimen other than nasopharyngeal swab, presence of viral mutation(s) within the areas targeted by this assay, and inadequate number of viral copies(<138 copies/mL). A negative result must be combined with clinical observations, patient history, and epidemiological information. The expected result is Negative.  Fact Sheet for Patients:  BloggerCourse.com  Fact Sheet for Healthcare Providers:   SeriousBroker.it  This test is no t yet approved or cleared by the [place] FDA and  has been authorized for detection and/or diagnosis of SARS-CoV-2 by FDA under an Emergency Use Authorization (EUA). This EUA will remain  in effect (meaning this test can be used) for the duration of the COVID-19 declaration under Section 564(b)(1) of the Act, 21 U.S.C.section 360bbb-3(b)(1), unless the authorization is terminated  or revoked sooner.       Influenza A by PCR NEGATIVE  NEGATIVE Final   Influenza B by PCR NEGATIVE NEGATIVE Final    Comment: (NOTE) The Xpert Xpress SARS-CoV-2/FLU/RSV plus assay is intended as an aid in the diagnosis of influenza from Nasopharyngeal swab specimens and should not be used as a sole basis for treatment. Nasal washings and aspirates are unacceptable for Xpert Xpress SARS-CoV-2/FLU/RSV testing.  Fact Sheet for Patients: BloggerCourse.com  Fact Sheet for Healthcare Providers: SeriousBroker.it  This test is not yet approved or cleared by the [place] FDA and has been authorized for detection and/or diagnosis of SARS-CoV-2 by FDA under an Emergency Use Authorization (EUA). This EUA will remain in effect (meaning this test can be used) for the duration of the COVID-19 declaration under Section 564(b)(1) of the Act, 21 U.S.C. section 360bbb-3(b)(1), unless the authorization is terminated or revoked.  Performed at [facility], [address], [place], [place] [address]      Labs: BNP (last 3 results) No results for input(s): BNP in the last 8760 hours. Basic Metabolic Panel: Recent Labs  Lab 04/22/20 0249 04/23/20 0255 04/24/20 0236 04/26/20 0228  NA 137 138 140 139  K 3.7 3.6 3.5 3.4*  CL 101 102 105 101  CO2 23 27 25 27   GLUCOSE 246* 152* 188* 232*  BUN 10 12 27* 17  CREATININE 0.98 0.97 1.67* 1.10  CALCIUM 9.1 9.4 9.5 9.1  MG 1.7 1.9 2.1 1.7    Liver Function Tests: No results for input(s): AST, ALT, ALKPHOS, BILITOT, PROT, ALBUMIN in the last 168 hours. No results for input(s): LIPASE, AMYLASE in the last 168 hours. No results for input(s): AMMONIA in the last 168 hours. CBC: Recent Labs  Lab 04/22/20 0249 04/23/20 0255 04/24/20 0236 04/26/20 0228  WBC 7.0 5.9 6.1 6.4  NEUTROABS 4.8 3.9 3.7 4.0  HGB 12.5* 12.6* 11.4* 10.3*  HCT 39.3 39.1 35.8* 32.8*  MCV 92.5 91.4 93.7 93.7  PLT 234 252 198 195   Cardiac Enzymes: No results for input(s): CKTOTAL, CKMB, CKMBINDEX, TROPONINI in the last 168 hours. BNP: Invalid input(s): POCBNP CBG: Recent Labs  Lab 04/26/20 1223 04/26/20 1642 04/26/20 2106 04/27/20 0539 04/27/20 1056  GLUCAP 241* 200* 269* 127* 249*   D-Dimer No results for input(s): DDIMER in the last 72 hours. Hgb A1c No results for input(s): HGBA1C in the last 72 hours. Lipid Profile No results for input(s): CHOL, HDL, LDLCALC, TRIG, CHOLHDL, LDLDIRECT in the last 72 hours. Thyroid function studies No results for input(s): TSH, T4TOTAL, T3FREE, THYROIDAB in the last 72 hours.  Invalid input(s): FREET3 Anemia work up No results for input(s): VITAMINB12, FOLATE, FERRITIN, TIBC, IRON, RETICCTPCT in the last 72 hours. Urinalysis    Component Value Date/Time   COLORURINE YELLOW 04/20/2020 2047   APPEARANCEUR CLEAR 04/20/2020 2047   LABSPEC 1.018 04/20/2020 2047   PHURINE 5.0 04/20/2020 2047   GLUCOSEU NEGATIVE 04/20/2020 2047   HGBUR MODERATE (A) 04/20/2020 2047   BILIRUBINUR NEGATIVE 04/20/2020 2047   KETONESUR NEGATIVE 04/20/2020 2047   PROTEINUR 30 (A) 04/20/2020 2047   NITRITE NEGATIVE 04/20/2020 2047   LEUKOCYTESUR NEGATIVE 04/20/2020 2047   Sepsis Labs Invalid input(s): PROCALCITONIN,  WBC,  LACTICIDVEN Microbiology Recent Results (from the past 240 hour(s))  Resp Panel by RT-PCR (Flu A&B, Covid) Nasopharyngeal Swab     Status: None   Collection Time: 04/20/20 12:28 PM   Specimen:  Nasopharyngeal Swab; Nasopharyngeal(NP) swabs in vial transport medium  Result Value Ref Range Status   SARS Coronavirus 2 by RT PCR NEGATIVE  NEGATIVE Final    Comment: (NOTE) SARS-CoV-2 target nucleic acids are NOT DETECTED.  The SARS-CoV-2 RNA is generally detectable in upper respiratory specimens during the acute phase of infection. The lowest concentration of SARS-CoV-2 viral copies this assay can detect is 138 copies/mL. A negative result does not preclude SARS-Cov-2 infection and should not be used as the sole basis for treatment or other patient management decisions. A negative result may occur with  improper specimen collection/handling, submission of specimen other than nasopharyngeal swab, presence of viral mutation(s) within the areas targeted by this assay, and inadequate number of viral copies(<138 copies/mL). A negative result must be combined with clinical observations, patient history, and epidemiological information. The expected result is Negative.  Fact Sheet for Patients:  BloggerCourse.com  Fact Sheet for Healthcare Providers:  SeriousBroker.it  This test is no t yet approved or cleared by the [place] FDA and  has been authorized for detection and/or diagnosis of SARS-CoV-2 by FDA under an Emergency Use Authorization (EUA). This EUA will remain  in effect (meaning this test can be used) for the duration of the COVID-19 declaration under Section 564(b)(1) of the Act, 21 U.S.C.section 360bbb-3(b)(1), unless the authorization is terminated  or revoked sooner.       Influenza A by PCR NEGATIVE NEGATIVE Final   Influenza B by PCR NEGATIVE NEGATIVE Final    Comment: (NOTE) The Xpert Xpress SARS-CoV-2/FLU/RSV plus assay is intended as an aid in the diagnosis of influenza from Nasopharyngeal swab specimens and should not be used as a sole basis for treatment. Nasal washings and aspirates are unacceptable for  Xpert Xpress SARS-CoV-2/FLU/RSV testing.  Fact Sheet for Patients: BloggerCourse.com  Fact Sheet for Healthcare Providers: SeriousBroker.it  This test is not yet approved or cleared by the [place] FDA and has been authorized for detection and/or diagnosis of SARS-CoV-2 by FDA under an Emergency Use Authorization (EUA). This EUA will remain in effect (meaning this test can be used) for the duration of the COVID-19 declaration under Section 564(b)(1) of the Act, 21 U.S.C. section 360bbb-3(b)(1), unless the authorization is terminated or revoked.  Performed at [facility], [address] [place], [place] [address]   Resp Panel by RT-PCR (Flu A&B, Covid) Nasopharyngeal Swab     Status: None   Collection Time: 04/27/20 10:59 AM   Specimen: Nasopharyngeal Swab; Nasopharyngeal(NP) swabs in vial transport medium  Result Value Ref Range Status   SARS Coronavirus 2 by RT PCR NEGATIVE NEGATIVE Final    Comment: (NOTE) SARS-CoV-2 target nucleic acids are NOT DETECTED.  The SARS-CoV-2 RNA is generally detectable in upper respiratory specimens during the acute phase of infection. The lowest concentration of SARS-CoV-2 viral copies this assay can detect is 138 copies/mL. A negative result does not preclude SARS-Cov-2 infection and should not be used as the sole basis for treatment or other patient management decisions. A negative result may occur with  improper specimen collection/handling, submission of specimen other than nasopharyngeal swab, presence of viral mutation(s) within the areas targeted by this assay, and inadequate number of viral copies(<138 copies/mL). A negative result must be combined with clinical observations, patient history, and epidemiological information. The expected result is Negative.  Fact Sheet for Patients:  BloggerCourse.com  Fact Sheet for Healthcare Providers:   SeriousBroker.it  This test is no t yet approved or cleared by the [place] FDA and  has been authorized for detection and/or diagnosis of SARS-CoV-2 by FDA under an Emergency Use Authorization (EUA). This EUA  will remain  in effect (meaning this test can be used) for the duration of the COVID-19 declaration under Section 564(b)(1) of the Act, 21 U.S.C.section 360bbb-3(b)(1), unless the authorization is terminated  or revoked sooner.       Influenza A by PCR NEGATIVE NEGATIVE Final   Influenza B by PCR NEGATIVE NEGATIVE Final    Comment: (NOTE) The Xpert Xpress SARS-CoV-2/FLU/RSV plus assay is intended as an aid in the diagnosis of influenza from Nasopharyngeal swab specimens and should not be used as a sole basis for treatment. Nasal washings and aspirates are unacceptable for Xpert Xpress SARS-CoV-2/FLU/RSV testing.  Fact Sheet for Patients: BloggerCourse.com  Fact Sheet for Healthcare Providers: SeriousBroker.it  This test is not yet approved or cleared by the [place] FDA and has been authorized for detection and/or diagnosis of SARS-CoV-2 by FDA under an Emergency Use Authorization (EUA). This EUA will remain in effect (meaning this test can be used) for the duration of the COVID-19 declaration under Section 564(b)(1) of the Act, 21 U.S.C. section 360bbb-3(b)(1), unless the authorization is terminated or revoked.  Performed at [facility], [address] [place], [place] [address]     Procedures/Studies: EEG  Result Date: 04/21/2020 Charlsie Quest, MD     04/21/2020  8:47 AM Patient Name: Douglas [name]. MRN: [id number] Epilepsy Attending: Charlsie Quest Referring Physician/Provider: Dr. Madelyn Flavors Date: 04/20/2020 Duration: 25.34 minutes Patient history: 69 year old male with history of multiple CVAs with right-sided deficits, seizures on lamotrigine who  presented with altered mental status, aphasia, left-sided weakness. EEG to evaluate for seizures. Level of alertness: Awake, drowsy, sleep, comatose, lethargic AEDs during EEG study: Lamotrigine Technical aspects: This EEG study was done with scalp electrodes positioned according to the 10-20 International system of electrode placement. Electrical activity was acquired at a sampling rate of  and reviewed with a high frequency filter of  and a low frequency filter of . EEG data were recorded continuously and digitally stored. Description: The posterior dominant rhythm consists of 8 Hz activity of moderate voltage (25-35 uV) seen predominantly in posterior head regions, asymmetric(L<R) and reactive to eye opening and eye closing. EEG showed continuous left hemispheric 3 to 6 Hz theta-delta slowing.  Hyperventilation and photic stimulation were not performed.   ABNORMALITY - Continuous slow, left hemisphere - Background asymmetry, left<right  IMPRESSION: This study is suggestive of cortical dysfunction in left hemisphere likely secondary to underlying infarct and can also be seen in post-ictal state. No seizures or epileptiform discharges were seen throughout the recording.  Charlsie Quest   MR BRAIN WO CONTRAST  Result Date: 04/20/2020 CLINICAL DATA:  Mental status change, unknown cause EXAM: MRI HEAD WITHOUT CONTRAST TECHNIQUE: Multiplanar, multiecho pulse sequences of the brain and surrounding structures were obtained without intravenous contrast. COMPARISON:  04/20/2020 and prior. FINDINGS: Brain: Chronic 3-4 mm focal left corona radiata restricted diffusion, less conspicuous than prior exam. Minimal cortically based restricted diffusion involving residual left parietal parenchyma, chronic sequela demonstrated on prior exams. Chronic left MCA and PCA territory infarcts with sequela of wallerian degeneration, superficial siderosis and cortical laminar necrosis. Ex vacuo dilatation of the left  greater than right lateral ventricles. Scattered remote microhemorrhages involving the bilateral cerebrum/cerebellum, brainstem and thalami. Confluent T2 hyperintense supratentorial white matter signal may reflect gliosis versus chronic microvascular ischemic changes. No significant midline shift. Multifocal chronic lacunar insults involving the right corona radiata/basal ganglia, pons and bilateral cerebellum. No mass lesion.  No extra-axial fluid collection. Vascular:  Chronic right V4 segment high-grade narrowing. Chronic distal left M1 segment occlusion. T2 hyperintense signal within the left petrous ICA may reflect slow flow versus high-grade narrowing. Skull and upper cervical spine: Normal marrow signal. Sinuses/Orbits: Normal orbits. Clear paranasal sinuses. No mastoid effusion. Other: None. IMPRESSION: No acute intracranial finding. 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 Multifocal chronic lacunar insults as detailed above. Chronic high-grade narrowing of the right V4 segment and distal left M1 segment occlusion. Left petrous ICA T2 hyperintense signal may reflect slow flow versus high-grade narrowing. These results were called by telephone at the time of interpretation on 04/20/2020 at 4:04 pm to provider [facility] , who verbally acknowledged these results. Electronically Signed   By: Stana Bunting M.D.   On: 04/20/2020 16:19   DG CHEST PORT 1 VIEW  Result Date: 04/20/2020 CLINICAL DATA:  Encephalopathy EXAM: PORTABLE CHEST 1 VIEW COMPARISON:  March 10, 2020 FINDINGS: Lungs are clear. Heart size and pulmonary vascularity are normal. No adenopathy. Loop recorder on left. There is aortic atherosclerosis. No bone lesions. IMPRESSION: Lungs clear.  Heart size normal.  Loop recorder on left. Aortic Atherosclerosis (ICD10-I70.0). Electronically Signed   By: Bretta Bang III M.D.   On: 04/20/2020 14:59   DG Swallowing Func-Speech Pathology  Result Date: 04/22/2020 Objective  Swallowing Evaluation: Type of Study: MBS-Modified Barium Swallow Study  Patient Details Name: Ash Mcelwain. MRN: [id number] Date of Birth: Jan 17, 1951 Today's Date: 04/22/2020 Time: SLP Start Time (ACUTE ONLY): 0948 -SLP Stop Time (ACUTE ONLY): 1005 SLP Time Calculation (min) (ACUTE ONLY): 17 min Past Medical History: Past Medical History: Diagnosis Date . Acute blood loss anemia  . Acute embolic stroke (HCC)  . Acute lower UTI  . AMS (altered mental status) 06/05/2019 . Cerebral edema (HCC) 04/04/2018 . Diabetes 1.5, managed as type 2 (HCC)  . Diabetes mellitus without complication (HCC)  . High cholesterol  . High glucose   Diabetes . History of CVA with residual deficit  . History of Dysphagia, post-stroke  . Hypertension  . Ischemic stroke (HCC) 03/05/2019 . Labile blood glucose  . 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 Renal insufficiency 04/04/2018 . Seizures (HCC)  . Stroke (HCC)  . UTI (urinary tract infection) 03/05/2019 Past Surgical History: Past Surgical History: Procedure Laterality Date . IR PATIENT EVAL TECH 0-60 MINS  03/30/2018 . LOOP RECORDER INSERTION N/A 03/08/2019  Procedure: LOOP RECORDER INSERTION;  Surgeon: Regan Lemming, MD;  Location: [facility] INVASIVE CV LAB;  Service: Cardiovascular;  Laterality: N/A; . TEE WITHOUT CARDIOVERSION N/A 04/02/2018  Procedure: TRANSESOPHAGEAL ECHOCARDIOGRAM (TEE);  Surgeon: [name], MD;  Location: [facility] ENDOSCOPY;  Service: Cardiovascular;  Laterality: N/A; HPI: Patient is a 69 y.o. male with PMH: HTN, HLD, DM-2, multiple CVA with residual right-sided hemiparesis, seizures, who was found by daughter pushing into the wall, acting more drowsy than normal and with elevated blood pressure. EMS was called for code stroke. At baseline, patient has right-sided deficits with no movement of right extremities, contracture of right UE, aphasia: "yes/no" are his only words and he uses gestures to communicate. He is wheelchair bound with assistance to  stand for toileting.  Subjective: alert, cooperative, pleasant Assessment / Plan / Recommendation CHL IP CLINICAL IMPRESSIONS 04/22/2020 Clinical Impression Pt presents with a moderate oropharyngeal dysphagia.  Pt's oral phase is prolonged due to decreased posterior propulsion of boluses towards the pharynx.  Pt's swallow response is triggered consistently at the pyriform sinuses which could be attributable to sensory deficits post  CVA.  He silently aspirated thin liquids both before and during the swallow.  He also appeared to have deep penetration leading to eventual aspiration of residual purees from the vallecula following administration of barium tablet whole in applesauce although the actual event was not visualized but rather noted upon return to pharynx following esophageal sweep.   As a result, pt may be advanced to dys 1, nectar thick liquids with full supervision for use of the following swallow precautions: avoid mixed consistencies, small bites and sips, upright for meals and for 30-60 min after, meds crushed in puree.   SLP Visit Diagnosis Dysphagia, oropharyngeal phase (R13.12) Attention and concentration deficit following -- Frontal lobe and executive function deficit following -- Impact on safety and function Moderate aspiration risk   CHL IP TREATMENT RECOMMENDATION 04/22/2020 Treatment Recommendations Therapy as outlined in treatment plan below   Prognosis 04/22/2020 Prognosis for Safe Diet Advancement Good Barriers to Reach Goals Time post onset;Severity of deficits Barriers/Prognosis Comment -- CHL IP DIET RECOMMENDATION 04/22/2020 SLP Diet Recommendations Dysphagia 1 (Puree) solids;Nectar thick liquid Liquid Administration via Cup;No straw Medication Administration Crushed with puree Compensations Minimize environmental distractions;Slow rate;Small sips/bites Postural Changes Remain semi-upright after after feeds/meals (Comment);Seated upright at 90 degrees   CHL IP OTHER RECOMMENDATIONS  04/22/2020 Recommended Consults -- Oral Care Recommendations Oral care BID Other Recommendations Order thickener from pharmacy;Clarify dietary restrictions;Prohibited food (jello, ice cream, thin soups);Remove water pitcher   CHL IP FOLLOW UP RECOMMENDATIONS 04/22/2020 Follow up Recommendations Skilled Nursing facility;24 hour supervision/assistance   CHL IP FREQUENCY AND DURATION 04/22/2020 Speech Therapy Frequency (ACUTE ONLY) min 2x/week Treatment Duration 2 weeks      CHL IP ORAL PHASE 04/22/2020 Oral Phase Impaired Oral - Pudding Teaspoon -- Oral - Pudding Cup -- Oral - Honey Teaspoon -- Oral - Honey Cup Reduced posterior propulsion;Delayed oral transit Oral - Nectar Teaspoon -- Oral - Nectar Cup Reduced posterior propulsion;Delayed oral transit Oral - Nectar Straw -- Oral - Thin Teaspoon -- Oral - Thin Cup Reduced posterior propulsion;Delayed oral transit Oral - Thin Straw Reduced posterior propulsion;Delayed oral transit Oral - Puree Reduced posterior propulsion;Delayed oral transit Oral - Mech Soft Reduced posterior propulsion;Delayed oral transit Oral - Regular -- Oral - Multi-Consistency -- Oral - Pill -- Oral Phase - Comment --  CHL IP PHARYNGEAL PHASE 04/22/2020 Pharyngeal Phase Impaired Pharyngeal- Pudding Teaspoon -- Pharyngeal -- Pharyngeal- Pudding Cup -- Pharyngeal -- Pharyngeal- Honey Teaspoon -- Pharyngeal -- Pharyngeal- Honey Cup Delayed swallow initiation-pyriform sinuses Pharyngeal -- Pharyngeal- Nectar Teaspoon -- Pharyngeal -- Pharyngeal- Nectar Cup Delayed swallow initiation-pyriform sinuses Pharyngeal -- Pharyngeal- Nectar Straw -- Pharyngeal -- Pharyngeal- Thin Teaspoon -- Pharyngeal -- Pharyngeal- Thin Cup Delayed swallow initiation-pyriform sinuses;Penetration/Aspiration before swallow;Penetration/Aspiration during swallow Pharyngeal Material enters airway, passes BELOW cords without attempt by patient to eject out (silent aspiration) Pharyngeal- Thin Straw Delayed swallow  initiation-pyriform sinuses Pharyngeal -- Pharyngeal- Puree Delayed swallow initiation-pyriform sinuses;Pharyngeal residue - valleculae Pharyngeal -- Pharyngeal- Mechanical Soft Delayed swallow initiation-pyriform sinuses;Pharyngeal residue - valleculae Pharyngeal -- Pharyngeal- Regular -- Pharyngeal -- Pharyngeal- Multi-consistency -- Pharyngeal -- Pharyngeal- Pill Delayed swallow initiation-pyriform sinuses;Pharyngeal residue - valleculae;Penetration/Apiration after swallow Pharyngeal -- Pharyngeal Comment --  CHL IP CERVICAL ESOPHAGEAL PHASE 11/22/2019 Cervical Esophageal Phase WFL Pudding Teaspoon -- Pudding Cup -- Honey Teaspoon -- Honey Cup -- Nectar Teaspoon -- Nectar Cup -- Nectar Straw -- Thin Teaspoon -- Thin Cup -- Thin Straw -- Puree -- Mechanical Soft -- Regular -- Multi-consistency -- Pill -- Cervical Esophageal Comment -- Page, Melanee SpryNicole L  04/22/2020, 10:39 AM              CUP PACEART REMOTE DEVICE CHECK  Result Date: 04/01/2020 ILR summary report received. Battery status OK. Normal device function. No new symptom, tachy, brady, or pause episodes. No new AF episodes. Monthly summary reports and ROV/PRN. HB  CT HEAD CODE STROKE WO CONTRAST  Result Date: 04/20/2020 CLINICAL DATA:  Code stroke.  Aphasia with left-sided weakness EXAM: CT HEAD WITHOUT CONTRAST TECHNIQUE: Contiguous axial images were obtained from the base of the skull through the vertex without intravenous contrast. COMPARISON:  02/23/2020 FINDINGS: Brain: Large remote left MCA and PCA territory infarct. No evidence of hemorrhage or acute infarct. Confluent low-density in the cerebral white matter attributed to chronic small vessel ischemia. Vascular: Atheromatous calcification. Prominent density at the right MCA bifurcation on axial slices but not clearly hyperdense on the reformats. Skull: No acute finding. Sinuses/Orbits: Negative Other: These results were communicated to Dr Napoleon Form at 11:45 amon 12/20/2021by text page via the [facility]  messaging system. ASPECTS [facility] Stroke Program Early CT Score) - Ganglionic level infarction (caudate, lentiform nuclei, internal capsule, insula, M1-M3 cortex): 7 - Supraganglionic infarction (M4-M6 cortex): 3 Total score (0-10 with 10 being normal): 10 IMPRESSION: 1. No acute finding. 2. Remote left cerebral infarction that only spares the ACA distribution. Electronically Signed   By: Marnee Spring M.D.   On: 04/20/2020 11:47     Time coordinating discharge: Over 30 minutes    Lewie Chamber, MD  [facility] 04/27/2020, 2:02 PM

## 2020-04-27 NOTE — Progress Notes (Signed)
  Speech Language Pathology Treatment: Dysphagia  Patient Details Name: Douglas [name]. MRN: [id number] DOB: [birth year] Today's Date: 04/27/2020 Time: 1000-1015 SLP Time Calculation (min) (ACUTE ONLY): 15 min  Assessment / Plan / Recommendation Clinical Impression  Douglas [name] was pleasant and cooperative for therapeutic diet tolerance. He had recently finished his breakfast tray of all purees, which he had consumed 100% with no difficulty reported by staff. He is noted with patchy white coating on lingual body, which did not appear to be consistent with food residue. He was seen with independent cup sips of nectar thick liquids. He was noted to utilize single sips on his own (no cues required). He had no s/s aspiration with these trials. At this time, continue recommendations for dysphagia 1 and nectar thick liquids, with SLP follow up to assess for upgrade as appropriate.   HPI HPI: Patient is a 69 y.o. male with PMH: HTN, HLD, DM-2, multiple CVA with residual right-sided hemiparesis, seizures, who was found by daughter pushing into the wall, acting more drowsy than normal and with elevated blood pressure. EMS was called for code stroke. At baseline, patient has right-sided deficits with no movement of right extremities, contracture of right UE, aphasia: "yes/no" are his only words and he uses gestures to communicate. He is wheelchair bound with assistance to stand for toileting.      SLP Plan  Continue with current plan of care       Recommendations  Diet recommendations: Dysphagia 1 (puree);Nectar-thick liquid Liquids provided via: Teaspoon;Cup Medication Administration: Crushed with puree Supervision: Staff to assist with self feeding;Full supervision/cueing for compensatory strategies Compensations: Minimize environmental distractions;Slow rate;Small sips/bites Postural Changes and/or Swallow Maneuvers: Seated upright 90 degrees;Upright 30-60 min after meal;Out of bed for meals                 Oral Care Recommendations: Oral care BID Follow up Recommendations: Skilled Nursing facility SLP Visit Diagnosis: Dysphagia, oropharyngeal phase (R13.12) Plan: Continue with current plan of care                     Douglas [name] P. Douglas [name], M.S., CCC-SLP Speech-Language Pathologist Acute Rehabilitation Services Pager: [phone]  Douglas [name] Douglas [name] 04/27/2020, 10:18 AM

## 2020-05-04 ENCOUNTER — Ambulatory Visit (INDEPENDENT_AMBULATORY_CARE_PROVIDER_SITE_OTHER): Payer: Medicare Other

## 2020-05-04 DIAGNOSIS — I639 Cerebral infarction, unspecified: Secondary | ICD-10-CM

## 2020-05-05 LAB — CUP PACEART REMOTE DEVICE CHECK
Date Time Interrogation Session: 20220102230700
Implantable Pulse Generator Implant Date: 20201106

## 2020-05-18 NOTE — Progress Notes (Signed)
Carelink Summary Report / Loop Recorder 

## 2020-05-21 ENCOUNTER — Emergency Department (HOSPITAL_COMMUNITY)
Admission: EM | Admit: 2020-05-21 | Discharge: 2020-05-22 | Disposition: A | Discharge status: Home / Self Care | Payer: Medicare Other | Attending: Emergency Medicine | Admitting: Emergency Medicine

## 2020-05-21 ENCOUNTER — Emergency Department (HOSPITAL_COMMUNITY): Payer: Medicare Other

## 2020-05-21 DIAGNOSIS — Z7902 Long term (current) use of antithrombotics/antiplatelets: Secondary | ICD-10-CM | POA: Diagnosis not present

## 2020-05-21 DIAGNOSIS — Z7984 Long term (current) use of oral hypoglycemic drugs: Secondary | ICD-10-CM | POA: Diagnosis not present

## 2020-05-21 DIAGNOSIS — Z7982 Long term (current) use of aspirin: Secondary | ICD-10-CM | POA: Insufficient documentation

## 2020-05-21 DIAGNOSIS — E1140 Type 2 diabetes mellitus with diabetic neuropathy, unspecified: Secondary | ICD-10-CM | POA: Diagnosis not present

## 2020-05-21 DIAGNOSIS — Z79899 Other long term (current) drug therapy: Secondary | ICD-10-CM | POA: Insufficient documentation

## 2020-05-21 DIAGNOSIS — R531 Weakness: Secondary | ICD-10-CM | POA: Insufficient documentation

## 2020-05-21 DIAGNOSIS — U071 COVID-19: Secondary | ICD-10-CM

## 2020-05-21 DIAGNOSIS — I10 Essential (primary) hypertension: Secondary | ICD-10-CM | POA: Diagnosis not present

## 2020-05-21 DIAGNOSIS — R569 Unspecified convulsions: Secondary | ICD-10-CM

## 2020-05-21 DIAGNOSIS — Z8673 Personal history of transient ischemic attack (TIA), and cerebral infarction without residual deficits: Secondary | ICD-10-CM | POA: Insufficient documentation

## 2020-05-21 DIAGNOSIS — Q670 Congenital facial asymmetry: Secondary | ICD-10-CM | POA: Insufficient documentation

## 2020-05-21 DIAGNOSIS — R4182 Altered mental status, unspecified: Secondary | ICD-10-CM | POA: Diagnosis present

## 2020-05-21 DIAGNOSIS — Z794 Long term (current) use of insulin: Secondary | ICD-10-CM | POA: Insufficient documentation

## 2020-05-21 LAB — CBC WITH DIFFERENTIAL/PLATELET
Abs Immature Granulocytes: 0.04 10*3/uL (ref 0.00–0.07)
Basophils Absolute: 0 10*3/uL (ref 0.0–0.1)
Basophils Relative: 0 %
Eosinophils Absolute: 0.1 10*3/uL (ref 0.0–0.5)
Eosinophils Relative: 1 %
HCT: 35.6 % — ABNORMAL LOW (ref 39.0–52.0)
Hemoglobin: 11 g/dL — ABNORMAL LOW (ref 13.0–17.0)
Immature Granulocytes: 1 %
Lymphocytes Relative: 14 %
Lymphs Abs: 0.7 10*3/uL (ref 0.7–4.0)
MCH: 29.9 pg (ref 26.0–34.0)
MCHC: 30.9 g/dL (ref 30.0–36.0)
MCV: 96.7 fL (ref 80.0–100.0)
Monocytes Absolute: 0.8 10*3/uL (ref 0.1–1.0)
Monocytes Relative: 15 %
Neutro Abs: 3.4 10*3/uL (ref 1.7–7.7)
Neutrophils Relative %: 69 %
Platelets: 146 10*3/uL — ABNORMAL LOW (ref 150–400)
RBC: 3.68 MIL/uL — ABNORMAL LOW (ref 4.22–5.81)
RDW: 14 % (ref 11.5–15.5)
WBC: 5 10*3/uL (ref 4.0–10.5)
nRBC: 0 % (ref 0.0–0.2)

## 2020-05-21 LAB — URINALYSIS, COMPLETE (UACMP) WITH MICROSCOPIC
Bacteria, UA: NONE SEEN
Bilirubin Urine: NEGATIVE
Glucose, UA: NEGATIVE mg/dL
Hgb urine dipstick: NEGATIVE
Ketones, ur: NEGATIVE mg/dL
Leukocytes,Ua: NEGATIVE
Nitrite: NEGATIVE
Protein, ur: NEGATIVE mg/dL
Specific Gravity, Urine: 1.013 (ref 1.005–1.030)
pH: 5 (ref 5.0–8.0)

## 2020-05-21 LAB — I-STAT VENOUS BLOOD GAS, ED
Acid-Base Excess: 4 mmol/L — ABNORMAL HIGH (ref 0.0–2.0)
Bicarbonate: 29.9 mmol/L — ABNORMAL HIGH (ref 20.0–28.0)
Calcium, Ion: 1.19 mmol/L (ref 1.15–1.40)
HCT: 30 % — ABNORMAL LOW (ref 39.0–52.0)
Hemoglobin: 10.2 g/dL — ABNORMAL LOW (ref 13.0–17.0)
O2 Saturation: 78 %
Potassium: 4.3 mmol/L (ref 3.5–5.1)
Sodium: 139 mmol/L (ref 135–145)
TCO2: 31 mmol/L (ref 22–32)
pCO2, Ven: 50.2 mmHg (ref 44.0–60.0)
pH, Ven: 7.382 (ref 7.250–7.430)
pO2, Ven: 44 mmHg (ref 32.0–45.0)

## 2020-05-21 LAB — COMPREHENSIVE METABOLIC PANEL
ALT: 28 U/L (ref 0–44)
AST: 25 U/L (ref 15–41)
Albumin: 3.2 g/dL — ABNORMAL LOW (ref 3.5–5.0)
Alkaline Phosphatase: 66 U/L (ref 38–126)
Anion gap: 9 (ref 5–15)
BUN: 17 mg/dL (ref 8–23)
CO2: 26 mmol/L (ref 22–32)
Calcium: 9.1 mg/dL (ref 8.9–10.3)
Chloride: 103 mmol/L (ref 98–111)
Creatinine, Ser: 1.56 mg/dL — ABNORMAL HIGH (ref 0.61–1.24)
GFR, Estimated: 48 mL/min — ABNORMAL LOW (ref >60)
Glucose, Bld: 234 mg/dL — ABNORMAL HIGH (ref 70–99)
Potassium: 4.3 mmol/L (ref 3.5–5.1)
Sodium: 138 mmol/L (ref 135–145)
Total Bilirubin: 0.7 mg/dL (ref 0.3–1.2)
Total Protein: 5.8 g/dL — ABNORMAL LOW (ref 6.5–8.1)

## 2020-05-21 LAB — CBG MONITORING, ED: Glucose-Capillary: 211 mg/dL — ABNORMAL HIGH (ref 70–99)

## 2020-05-21 LAB — LACTIC ACID, PLASMA
Lactic Acid, Venous: 2.2 mmol/L (ref 0.5–1.9)
Lactic Acid, Venous: 2.1 mmol/L (ref 0.5–1.9)

## 2020-05-21 LAB — SARS CORONAVIRUS 2 (TAT 6-24 HRS): SARS Coronavirus 2: POSITIVE — AB

## 2020-05-21 LAB — AMMONIA: Ammonia: 25 umol/L (ref 9–35)

## 2020-05-21 MED ORDER — LAMOTRIGINE 150 MG PO TABS
150.0000 mg | ORAL_TABLET | Freq: Once | ORAL | Status: AC
  Administered 2020-05-21: 150 mg via ORAL
  Filled 2020-05-21: qty 1

## 2020-05-21 MED ORDER — LACTATED RINGERS IV BOLUS
1000.0000 mL | Freq: Once | INTRAVENOUS | Status: AC
  Administered 2020-05-21: 1000 mL via INTRAVENOUS

## 2020-05-21 NOTE — ED Notes (Signed)
Changed brief on pt 

## 2020-05-21 NOTE — ED Notes (Signed)
Daughter has gone home.

## 2020-05-21 NOTE — ED Provider Notes (Signed)
[facility]   CSN: [id number] Arrival date & time: 05/21/20  1517     History Chief Complaint  Patient presents with  . Altered Mental Status    Douglas CordiaWilliam L Marksberry Jr. is a 70 y.o. male.  The history is provided by the EMS personnel (daughter, [place]inda [place] ).  Altered Mental Status Presenting symptoms: confusion and partial responsiveness   Severity:  Moderate Most recent episode:  Today Episode history:  Single Duration:  1 day Timing:  Constant Progression:  Unchanged Context: nursing home resident   Context: not recent illness and not recent infection   Associated symptoms: seizures        Past Medical History:  Diagnosis Date  . Acute blood loss anemia   . Acute embolic stroke (HCC)   . Acute lower UTI   . AMS (altered mental status) 06/05/2019  . Cerebral edema (HCC) 04/04/2018  . Diabetes 1.5, managed as type 2 (HCC)   . Diabetes mellitus without complication (HCC)   . High cholesterol   . High glucose    Diabetes  . History of CVA with residual deficit   . History of Dysphagia, post-stroke   . Hypertension   . Ischemic stroke (HCC) 03/05/2019  . Labile blood glucose   . 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 Renal insufficiency 04/04/2018  . Seizures (HCC)   . Stroke (HCC)   . UTI (urinary tract infection) 03/05/2019    Patient Active Problem List   Diagnosis Date Noted  . Decubitus ulcer of sacral region, stage 2 (HCC) 04/27/2020  . Protein-calorie malnutrition, severe 04/23/2020  . Physical debility 04/22/2020  . Hyperlipidemia 04/20/2020  . Slow transit constipation 12/26/2019  . Seizure (HCC) 11/20/2019  . Seizures (HCC) 11/20/2019  . Encephalopathy 11/19/2019  . Delayed orthostatic hypotension 08/04/2019  . Seizure disorder (HCC) 07/21/2019  . 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 Shoulder subluxation, right, sequela 07/16/2019  . Altered mental status  07/15/2019  . 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 Late effects of CVA (cerebrovascular accident)   . Pressure injury of skin 06/08/2019  . History of loop recorder 03/08/2019  . UTI (urinary tract infection) 03/05/2019  . 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 Global aphasia   . Type 2 diabetes mellitus with peripheral neuropathy (HCC)   . Hypertension associated with diabetes (HCC)   . Hyperlipidemia associated with type 2 diabetes mellitus (HCC)   . History of CVA (cerebrovascular accident)   . History of Dysphagia, post-stroke     Past Surgical History:  Procedure Laterality Date  . IR PATIENT EVAL TECH 0-60 MINS  03/30/2018  . LOOP RECORDER INSERTION N/A 03/08/2019   Procedure: LOOP RECORDER INSERTION;  Surgeon: Regan Lemmingamnitz, Will Martin, MD;  Location: [facility] INVASIVE CV LAB;  Service: Cardiovascular;  Laterality: N/A;  . TEE WITHOUT CARDIOVERSION N/A 04/02/2018   Procedure: TRANSESOPHAGEAL ECHOCARDIOGRAM (TEE);  Surgeon: Chilton Siandolph, Tiffany, MD;  [facility];  Service: Cardiovascular;  Laterality: N/A;       Family History  Problem Relation Age of Onset  . Leukemia Mother   . Dementia Father     Social History   Tobacco Use  . Smoking status: Never Smoker  . Smokeless tobacco: Never Used  Vaping Use  . Vaping Use: Never used  Substance Use Topics  . Alcohol use: Not Currently  . Drug use: Never  Home Medications Prior to Admission medications   Medication Sig Start Date End Date Taking? Authorizing Provider  amLODipine (NORVASC) 10 MG tablet Take 1 tablet (10 mg total) by mouth daily. 03/06/20   Ngetich, Dinah C, NP  ascorbic acid (VITAMIN C) 500 MG tablet Take 1 tablet (500 mg total) by mouth daily as needed (if cold-like symptoms are present). 08/30/19   Margit HanksAlexander, Anne D, MD  aspirin EC 325 MG tablet Take 325 mg by mouth every morning.    [provider]  atorvastatin (LIPITOR) 40 MG  tablet Take 1 tablet (40 mg total) by mouth daily at 6 (six) AM. 12/26/19   Ngetich, Dinah C, NP  baclofen (LIORESAL) 20 MG tablet Take 1 tablet (20 mg total) by mouth 2 (two) times daily. 03/06/20   Ngetich, Dinah C, NP  carvedilol (COREG) 12.5 MG tablet TAKE 1 TABLET (12.5 MG TOTAL) BY MOUTH 2 (TWO) TIMES DAILY WITH A MEAL. 04/27/20   Ngetich, Dinah C, NP  clopidogrel (PLAVIX) 75 MG tablet Take 1 tablet (75 mg total) by mouth daily. 12/26/19   Ngetich, Dinah C, NP  glipiZIDE (GLUCOTROL) 10 MG tablet Take 1 tablet (10 mg total) by mouth 2 (two) times daily with a meal. 12/26/19   Ngetich, Dinah C, NP  insulin aspart (NOVOLOG) 100 UNIT/ML injection Inject 2-15 Units into the skin 4 (four) times daily -  before meals and at bedtime. SSI:  121-150 = 2 units; 151-200 = 3 units, 201-250 = 5 units, 251-300 = 8 units, 301-350 = 11 units, 351-300 = 15 units, greater than 400 = 15 units and call MD 12/26/19   Ngetich, Dinah C, NP  insulin detemir (LEVEMIR) 100 UNIT/ML injection Inject 0.05 mLs (5 Units total) into the skin daily. If CBG >125 (per daughter) 04/27/20   Lewie ChamberGirguis, David, MD  lamoTRIgine (LAMICTAL) 150 MG tablet Take 1 tablet (150 mg total) by mouth 2 (two) times daily. 03/06/20   Ngetich, Dinah C, NP  linagliptin (TRADJENTA) 5 MG TABS tablet Take 1 tablet (5 mg total) by mouth daily. 04/28/20   Lewie ChamberGirguis, David, MD  liver oil-zinc oxide (DESITIN) 40 % ointment Apply 1 application topically 2 (two) times daily as needed for irritation. 12/26/19   Ngetich, Dinah C, NP  senna-docusate (SENOKOT-S) 8.6-50 MG tablet Take 1 tablet by mouth at bedtime as needed for mild constipation. 04/27/20   Lewie ChamberGirguis, David, MD  tamsulosin (FLOMAX) 0.4 MG CAPS capsule Take 1 capsule (0.4 mg total) by mouth daily after supper. 12/27/19   Ngetich, Dinah C, NP  traZODone (DESYREL) 50 MG tablet TAKE 1/2-1 TABLET BY MOUTH AT BEDTIME AS NEEDED FOR SLEEP. Patient taking differently: Take 25 mg by mouth at bedtime as needed for sleep.  03/30/20   Ngetich, Dinah C, NP  Vitamin D3 (VITAMIN D) 25 MCG tablet Take 0.5 tablets (500 Units total) by mouth daily. 08/30/19   Margit HanksAlexander, Anne D, MD    Allergies    Ciprofloxacin, Keppra [levetiracetam], Avocado, and Metformin and related  Review of Systems   Review of Systems  Unable to perform ROS: Mental status change  Neurological: Positive for seizures.  Psychiatric/Behavioral: Positive for confusion.    Physical Exam Updated Vital Signs BP (!) 135/102   Pulse 87   Temp 99.2 F (37.3 C) (Rectal)   Resp (!) 30   SpO2 97%   Physical Exam Vitals and nursing note reviewed.  Constitutional:      Appearance: He is well-developed and well-nourished.  HENT:  Head: Normocephalic and atraumatic.  Eyes:     Conjunctiva/sclera: Conjunctivae normal.  Cardiovascular:     Rate and Rhythm: Normal rate and regular rhythm.     Pulses:          Radial pulses are 2+ on the right side and 2+ on the left side.       Dorsalis pedis pulses are 2+ on the right side and 2+ on the left side.     Heart sounds: No murmur heard.   Pulmonary:     Effort: Pulmonary effort is normal. No respiratory distress.     Breath sounds: Normal breath sounds.  Abdominal:     Palpations: Abdomen is soft.     Tenderness: There is no abdominal tenderness.  Musculoskeletal:        General: No edema.     Cervical back: Neck supple.  Skin:    General: Skin is warm.     Findings: No rash.     Comments: Warm to the touch   Neurological:     Mental Status: He is lethargic.     GCS: GCS eye subscore is 4. GCS verbal subscore is 2. GCS motor subscore is 6.     Cranial Nerves: Facial asymmetry present.     Motor: Weakness (RUE/RLE) present.  Psychiatric:        Mood and Affect: Mood and affect normal.     ED Results / Procedures / Treatments   Labs (all labs ordered are listed, but only abnormal results are displayed) Labs Reviewed  SARS CORONAVIRUS 2 (TAT 6-24 HRS) - Abnormal; Notable for the  following components:      Result Value   SARS Coronavirus 2 POSITIVE (*)    All other components within normal limits  LACTIC ACID, PLASMA - Abnormal; Notable for the following components:   Lactic Acid, Venous 2.2 (*)    All other components within normal limits  LACTIC ACID, PLASMA - Abnormal; Notable for the following components:   Lactic Acid, Venous 2.1 (*)    All other components within normal limits  COMPREHENSIVE METABOLIC PANEL - Abnormal; Notable for the following components:   Glucose, Bld 234 (*)    Creatinine, Ser 1.56 (*)    Total Protein 5.8 (*)    Albumin 3.2 (*)    GFR, Estimated 48 (*)    All other components within normal limits  CBC WITH DIFFERENTIAL/PLATELET - Abnormal; Notable for the following components:   RBC 3.68 (*)    Hemoglobin 11.0 (*)    HCT 35.6 (*)    Platelets 146 (*)    All other components within normal limits  CBG MONITORING, ED - Abnormal; Notable for the following components:   Glucose-Capillary 211 (*)    All other components within normal limits  I-STAT VENOUS BLOOD GAS, ED - Abnormal; Notable for the following components:   Bicarbonate 29.9 (*)    Acid-Base Excess 4.0 (*)    HCT 30.0 (*)    Hemoglobin 10.2 (*)    All other components within normal limits  CULTURE, BLOOD (ROUTINE X 2)  CULTURE, BLOOD (ROUTINE X 2)  URINALYSIS, COMPLETE (UACMP) WITH MICROSCOPIC  AMMONIA  LACTIC ACID, PLASMA    EKG None  Radiology CT HEAD WO CONTRAST  Result Date: 05/21/2020 CLINICAL DATA:  Mental status change, unknown cause; altered. Additional provided: Patient less alert with decreased appetite. EXAM: CT HEAD WITHOUT CONTRAST TECHNIQUE: Contiguous axial images were obtained from the base of the skull through the vertex  without intravenous contrast. COMPARISON:  Brain MRI 04/20/2020.  Head CT 04/20/2020. FINDINGS: Brain: Redemonstrated large chronic left MCA and PCA territory infarct with ex vacuo dilatation of the left lateral ventricle.  Background moderate cerebral atrophy. Comparatively mild cerebellar atrophy. Advanced ill-defined hypoattenuation within the cerebral white matter elsewhere is nonspecific, but compatible chronic small vessel ischemic disease. There is no acute intracranial hemorrhage. No acute demarcated cortical infarct. No extra-axial fluid collection. No evidence of intracranial mass. No midline shift. Vascular: No hyperdense vessel.  Atherosclerotic calcifications. Skull: Normal. Negative for fracture or focal lesion. Sinuses/Orbits: Visualized orbits show no acute finding. Mild bilateral ethmoid sinus mucosal thickening. IMPRESSION: No evidence of acute intracranial abnormality. Redemonstrated large chronic left MCA and PCA territory infarct. Background moderate cerebral atrophy and advanced chronic small vessel ischemic disease. Mild ethmoid sinus mucosal thickening. Electronically Signed   By: Jackey Loge DO   On: 05/21/2020 19:36   DG Chest Port 1 View  Result Date: 05/21/2020 CLINICAL DATA:  Altered mental status and decreased appetite. EXAM: PORTABLE CHEST 1 VIEW COMPARISON:  April 20, 2020 FINDINGS: The heart size and mediastinal contours are within normal limits. A radiopaque loop recorder device is seen. Both lungs are clear. Degenerative changes are seen within the thoracic spine. IMPRESSION: No active disease. Electronically Signed   By: Aram Candela M.D.   On: 05/21/2020 16:17    Procedures Procedures (including critical care time)  Medications Ordered in ED Medications  lactated ringers bolus 1,000 mL (0 mLs Intravenous Stopped 05/21/20 2258)  lamoTRIgine (LAMICTAL) tablet 150 mg (150 mg Oral Given 05/21/20 2305)    ED Course  I have reviewed the triage vital signs and the nursing notes.  Pertinent labs & imaging results that were available during my care of the patient were reviewed by me and considered in my medical decision making (see chart for details).    MDM  Rules/Calculators/A&P                          This is a 70 year old male with a past medical history of multiple CVAs with residual right-sided deficits, type 2 diabetes, hyperlipidemia, hypertension, seizure disorder, chronic kidney disease who presents with altered level of consciousness, not following commands.  Daughter at bedside reports that his baseline he is usually able to say yes, is interactive, labs, and engages in watching TV and with interactions.  Today, the daughter went to go visit and noticed that he was more somnolent, not interactive, did not want to eat his lunch.  Patient did eat breakfast and work well with physical therapy this morning.  Daughter states over the last few days the patient has been coughing, did receive 1 vaccine against COVID-19.  EMS arrived, sugar elevated to 73, systolic blood pressures in the low 100s.  On exam the patient has a mild right-sided facial droop, significant weakness of the right upper and right lower extremity; all of which are consistent with his previous exams secondary to his strokes.  He feels warm to the touch, abdomen is soft and nontender.  There is no leg asymmetry, swelling, or tenderness with deep palpation of the lower extremities.  Daughter reports the patient was complaining of some cramping in his leg and pointing to his thigh the last few days, believes that he was given a medication to help last night, I called the nursing home who confirmed that he was given 1 g of Tylenol for this pain. She also confirms  that today he has been much more flat and not as interactive. She states that usually he is borderline aggressive and will shout out words, but today he seemed much more reserved.   The right-sided weakness and right facial droop is chronic for this patient.  A CT of the head did show chronic changes related to his known left MCA and PCA stroke.  His mentation continued to improve during his emergency department stay, daughter at  bedside does confirm that he has returned to his baseline.  He is laughing and watching TV, following commands and wiping his own mouth with his left hand.  Laboratory work-up shows lactic acid of 2.2 that is downtrending to 2.1.  His venous blood gas is within normal limits, his chemistry shows hyperglycemia and a creatinine of 1.56 which is above his previous that was 1.1, but has been as high as 1.67 in the past.  He was given a 1 L bolus of lactated ringer.  His CBC shows no leukocytosis, hemoglobin that is baseline anemia of 11.  Initially labs were drawn for potential sepsis as patient had a hypotensive reading, however when I went to bedside his noninvasive blood pressure cuff was not on his arm and I am led to believe that this blood pressure was not accurate.  When applying the NIBP to his left arm, normotensive blood pressures were obtained and maintained during his stay in the emergency department.  Chest x-ray shows no evidence of acute infectious process or cardiopulmonary abnormality.  His COVID test did return positive, his oxygen saturations have been maintained at 100% on room air on multiple reevaluations by myself and he has no increased work of breathing.  His brief transient change in mental status is likely related to potential seizures as he does have a history of seizures and has definitive etiologies for epileptiform discharges from his previous strokes.  Now that he has returned to baseline, he was given his p.m. dose of Trileptal and we will plan to discharge back to [facility] for continued rehab.  The daughter does question if the patient could be transferred, I did speak with case management who recommended the daughter speak with the [facility] Case management/social worker to discuss possible transfer and would not advise against pulling the patient from the facility at this time to avoid potential repercussions regarding insurance and rehab reimbursement.  Urinalysis  appears noninfectious, no evidence of obvious pneumonia on chest x-ray, no other concerning source of infection at this time with normal leukocyte count and downtrending lactic acid, do not believe antibiotics are warranted at this time.  Again has returned to his baseline and is stable for discharge back to the facility.  Final Clinical Impression(s) / ED Diagnoses Final diagnoses:  Seizures (HCC)  History of CVA (cerebrovascular accident)  COVID-19    Rx / DC Orders ED Discharge Orders    None       Kathleen Lime, MD 05/21/20 0454    Gwyneth Sprout, MD 05/22/20 1945

## 2020-05-21 NOTE — ED Notes (Signed)
Requested NS to call [facility] for this pt.

## 2020-05-21 NOTE — ED Notes (Signed)
Confirmed with EDP that fecal occult was positive.

## 2020-05-21 NOTE — ED Notes (Signed)
Phlebotomy is going to draw a second set of blood cultures and a second LA.

## 2020-05-21 NOTE — ED Notes (Signed)
Notified EDP with difficulty obtaining IV access and labs.

## 2020-05-21 NOTE — ED Provider Notes (Incomplete)
Patient is a 70 year old male with a history of diabetes, hypertension, right-sided stroke with significant paralysis, recurrent UTIs and seizure disorder who presents today with change in mental status.  Daughter saw him yesterday and he seemed his normal self.  He apparently did eat breakfast and worked with physical therapy and was fine but daughter went to see him at noon today and he would not eat his lunch and seemed much more sluggish.  She reports he is normally bright-eyed and able to follow a conversation and occasionally laugh but today he was not himself.  There was no evidence of seizure today while at the facility but still possibility.  Patient does open his eyes and look around while here but does appear somnolent.  He has no focal findings suggestive of cellulitis.  Blood pressure is in the low 100s and patient feels warm on exam.  There is no significant findings for new neurologic deficits.  There has been no new medications other than a new diabetic medication but no mind altering medications.  Patient blood sugar was greater than 200 today.  Concern for possibly an infectious etiology versus toxic metabolic vs SZ.  He did receive Tylenol yesterday but no other medications.  Rectal temperature is pending but patient will most likely meet criteria for code sepsis. Rectal temp was normal.  Pt's labs are reassuring other than mild elevation of lactate.  VS remain stable.  After observation pt is now back to his baseline and is much more alert.  Daughter reports he is at baseline and at this time suspect possible sz.

## 2020-05-21 NOTE — Discharge Instructions (Addendum)
The COVID result did come back positive, recommend quarantining for 5 days, ensuring adequate fluid intake and nutrition.  Monitor oxygen levels at the facility and return if patient is having any problems with breathing.    He will be discharged and transported back to [facility] facility.  As I spoke with your daughter, further action for possible transfer can be taken by speaking with the social worker/case management at [facility].  The COVID test that we obtained is pending at this time, can call for final results.  You were given a dose of your antiseizure medication (Lamictal/lamotrigine), I believe the change in his behavior was secondary to a possible seizure that he may have had that was unwitnessed this morning.  Please return to the emergency department if there is any further concerns for changes in mental status.

## 2020-05-21 NOTE — ED Notes (Addendum)
[facility] was called @17 :35, it will be aminute before he gets picked up

## 2020-05-21 NOTE — ED Triage Notes (Signed)
Pt bib by ems from [facility] per dtr request. Dtr reports pt less alert and decreased appetite. Pt @ baseline is nonverbal with extreme right sided deficits. Cbg 276

## 2020-05-22 ENCOUNTER — Telehealth: Payer: Self-pay

## 2020-05-22 ENCOUNTER — Telehealth: Payer: Self-pay | Admitting: Physician Assistant

## 2020-05-22 NOTE — Telephone Encounter (Signed)
Patient daughter "Joseff Luckman" called and states that patient is in [facility]. Patient tested Positive for Covid-19 and she just wanted to notify us. She states that the nurse at the facility let her know that if she chooses to change patient facility that it has to be done by PCP Ngetich, Dinah C, NP . Patient daughter states she's not having good experience so far with facilities. Message routed to PCP Ngetich, Donalee Citrin, NP.

## 2020-05-22 NOTE — Telephone Encounter (Signed)
Patient daughter "Jordani Nunn"  Called and notified. She states that she will notify us if she decided to change patient facility. She aware that it's up to POA, and Patient to make that decision.

## 2020-05-22 NOTE — Telephone Encounter (Signed)
Noted  

## 2020-05-22 NOTE — Telephone Encounter (Signed)
Called to discuss with patient about COVID-19 symptoms and the use of one of the available treatments for those with mild to moderate Covid symptoms and at a high risk of hospitalization.  Pt appears to qualify for outpatient treatment due to co-morbid conditions and/or a member of an at-risk group in accordance with the FDA Emergency Use Authorization.     Daughter picked up phone and she is at work. She will call us back after 6 pm.  Unable to review symptoms onset and vaccination status.   [employer]

## 2020-05-22 NOTE — Telephone Encounter (Signed)
Sorry to hear that he tested positive for COVID-19.I wish him a quick recovery.Please discuss concerns with Facility administration.It's up to patient's POA and patient  to determine whether he want to continue Rehab at the facility not provider.

## 2020-05-26 LAB — CULTURE, BLOOD (ROUTINE X 2)
Culture: NO GROWTH
Special Requests: ADEQUATE

## 2020-05-27 LAB — CULTURE, BLOOD (ROUTINE X 2): Culture: NO GROWTH

## 2020-05-30 ENCOUNTER — Other Ambulatory Visit: Payer: Self-pay | Admitting: Family

## 2020-05-30 DIAGNOSIS — G40209 Localization-related (focal) (partial) symptomatic epilepsy and epileptic syndromes with complex partial seizures, not intractable, without status epilepticus: Secondary | ICD-10-CM

## 2020-05-31 ENCOUNTER — Other Ambulatory Visit: Payer: Self-pay | Admitting: Family

## 2020-05-31 DIAGNOSIS — E1142 Type 2 diabetes mellitus with diabetic polyneuropathy: Secondary | ICD-10-CM

## 2020-05-31 DIAGNOSIS — G40209 Localization-related (focal) (partial) symptomatic epilepsy and epileptic syndromes with complex partial seizures, not intractable, without status epilepticus: Secondary | ICD-10-CM

## 2020-06-01 ENCOUNTER — Other Ambulatory Visit: Payer: Self-pay

## 2020-06-01 ENCOUNTER — Ambulatory Visit (INDEPENDENT_AMBULATORY_CARE_PROVIDER_SITE_OTHER): Payer: Medicare Other | Admitting: Podiatry

## 2020-06-01 ENCOUNTER — Encounter: Payer: Self-pay | Admitting: Podiatry

## 2020-06-01 DIAGNOSIS — E1142 Type 2 diabetes mellitus with diabetic polyneuropathy: Secondary | ICD-10-CM

## 2020-06-01 DIAGNOSIS — B351 Tinea unguium: Secondary | ICD-10-CM

## 2020-06-01 DIAGNOSIS — B353 Tinea pedis: Secondary | ICD-10-CM

## 2020-06-01 DIAGNOSIS — M79674 Pain in right toe(s): Secondary | ICD-10-CM

## 2020-06-01 DIAGNOSIS — M79675 Pain in left toe(s): Secondary | ICD-10-CM

## 2020-06-01 MED ORDER — KETOCONAZOLE 2 % EX CREA: 1.0000 "application " | TOPICAL_CREAM | Freq: Every day | CUTANEOUS | 2 refills | Status: AC

## 2020-06-01 MED ORDER — CICLOPIROX 8 % EX SOLN: Freq: Every day | CUTANEOUS | 2 refills | Status: AC

## 2020-06-01 NOTE — Progress Notes (Signed)
  Subjective:  Patient ID: Douglas Cordia., male    DOB: 01-Nov-1950,  MRN: [id number]  Chief Complaint  Patient presents with  . diabetic foot care    Nail trim     70 y.o. male presents with the above complaint. History confirmed with patient. Here with his daughter. 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 His nails are severely thickened, elongated and unable to be cut at home by themselves. He also has very dry skin on the bottom of the feet  Objective:  Physical Exam: warm, good capillary refill, no trophic changes or ulcerative lesions and normal DP and PT pulses. Absent motor function bilateral LE. Onychomycosis x10 with elongated, thickened nails brown and yellow discoloration. Severe gryphosis of hallux nails. Tinea pedis in moccasin distribution Assessment:   1. Type 2 diabetes mellitus with peripheral neuropathy (HCC)   2. Onychomycosis   3. Pain due to onychomycosis of toenails of both feet   4. Tinea pedis of both feet      Plan:  Patient was evaluated and treated and all questions answered.  Patient educated on diabetes. Discussed proper diabetic foot care and discussed risks and complications of disease. Educated patient in depth on reasons to return to the office immediately should he/she discover anything concerning or new on the feet. All questions answered. Discussed proper shoes as well.   Discussed the etiology and treatment options for the condition in detail with the patient. Educated patient on the topical and oral treatment options for mycotic nails. Recommended debridement of the nails today. Sharp and mechanical debridement performed of all painful and mycotic nails today. Nails debrided in length and thickness using a nail nipper to level of comfort. Discussed treatment options including appropriate shoe gear. Follow up as needed for painful nails. Rx for penlac sent to pharmacy  Discussed the etiology and treatment options for tinea  pedis.  Discussed topical and oral treatment.  Recommended topical treatment with 2% ketoconazole cream.  This was sent to the patient's pharmacy.  Also discussed appropriate foot hygiene, use of antifungal spray such as Tinactin in shoes, as well as cleaning foot surfaces such as showers and bathroom floors with bleach.   Return in about 3 months (around 08/29/2020) for at risk diabetic foot care.

## 2020-06-04 ENCOUNTER — Ambulatory Visit (INDEPENDENT_AMBULATORY_CARE_PROVIDER_SITE_OTHER): Payer: Medicare Other

## 2020-06-04 DIAGNOSIS — I639 Cerebral infarction, unspecified: Secondary | ICD-10-CM | POA: Diagnosis not present

## 2020-06-08 LAB — CUP PACEART REMOTE DEVICE CHECK
Date Time Interrogation Session: 20220204230222
Implantable Pulse Generator Implant Date: 20201106

## 2020-06-09 ENCOUNTER — Ambulatory Visit: Payer: Self-pay | Admitting: Family

## 2020-06-10 ENCOUNTER — Encounter (HOSPITAL_COMMUNITY): Payer: Self-pay | Admitting: Emergency Medicine

## 2020-06-10 ENCOUNTER — Emergency Department (HOSPITAL_COMMUNITY)
Admission: EM | Admit: 2020-06-10 | Discharge: 2020-06-11 | Disposition: A | Discharge status: Home / Self Care | Payer: Medicare Other | Attending: Emergency Medicine | Admitting: Emergency Medicine

## 2020-06-10 DIAGNOSIS — R1032 Left lower quadrant pain: Secondary | ICD-10-CM | POA: Insufficient documentation

## 2020-06-10 DIAGNOSIS — Z5321 Procedure and treatment not carried out due to patient leaving prior to being seen by health care provider: Secondary | ICD-10-CM | POA: Diagnosis not present

## 2020-06-10 LAB — COMPREHENSIVE METABOLIC PANEL
ALT: 20 U/L (ref 0–44)
AST: 16 U/L (ref 15–41)
Albumin: 3.8 g/dL (ref 3.5–5.0)
Alkaline Phosphatase: 73 U/L (ref 38–126)
Anion gap: 10 (ref 5–15)
BUN: 24 mg/dL — ABNORMAL HIGH (ref 8–23)
CO2: 28 mmol/L (ref 22–32)
Calcium: 9.3 mg/dL (ref 8.9–10.3)
Chloride: 102 mmol/L (ref 98–111)
Creatinine, Ser: 1.24 mg/dL (ref 0.61–1.24)
GFR, Estimated: >60 mL/min (ref >60)
Glucose, Bld: 158 mg/dL — ABNORMAL HIGH (ref 70–99)
Potassium: 3.6 mmol/L (ref 3.5–5.1)
Sodium: 140 mmol/L (ref 135–145)
Total Bilirubin: 0.8 mg/dL (ref 0.3–1.2)
Total Protein: 6.9 g/dL (ref 6.5–8.1)

## 2020-06-10 LAB — CBC
HCT: 36.9 % — ABNORMAL LOW (ref 39.0–52.0)
Hemoglobin: 12.2 g/dL — ABNORMAL LOW (ref 13.0–17.0)
MCH: 30.8 pg (ref 26.0–34.0)
MCHC: 33.1 g/dL (ref 30.0–36.0)
MCV: 93.2 fL (ref 80.0–100.0)
Platelets: 286 10*3/uL (ref 150–400)
RBC: 3.96 MIL/uL — ABNORMAL LOW (ref 4.22–5.81)
RDW: 13.2 % (ref 11.5–15.5)
WBC: 5 10*3/uL (ref 4.0–10.5)
nRBC: 0 % (ref 0.0–0.2)

## 2020-06-10 LAB — LIPASE, BLOOD: Lipase: 30 U/L (ref 11–51)

## 2020-06-10 NOTE — ED Triage Notes (Signed)
Patient arrived with EMS from home daughter reported LLQ pain for 2 days , no emesis or diarrhea , no fever or chills . He is non verbal history of CVA .

## 2020-06-10 NOTE — Progress Notes (Signed)
Carelink Summary Report / Loop Recorder 

## 2020-06-11 NOTE — ED Notes (Signed)
Pt daughter stated, "I think we are just going to go. He seems to be fine." Two sort techs assisted pt into daughters car.

## 2020-06-18 ENCOUNTER — Ambulatory Visit: Payer: Medicare Other | Admitting: Family

## 2020-06-26 ENCOUNTER — Telehealth: Payer: Self-pay | Admitting: *Deleted

## 2020-06-26 NOTE — Telephone Encounter (Signed)
[place] with [facility] called requesting verbal orders to continue OT 1x6wks.  Verbal orders given.

## 2020-07-03 ENCOUNTER — Ambulatory Visit: Payer: Medicare Other | Admitting: Family

## 2020-07-06 ENCOUNTER — Ambulatory Visit (INDEPENDENT_AMBULATORY_CARE_PROVIDER_SITE_OTHER): Payer: Medicare Other

## 2020-07-06 DIAGNOSIS — I639 Cerebral infarction, unspecified: Secondary | ICD-10-CM

## 2020-07-09 LAB — CUP PACEART REMOTE DEVICE CHECK
Date Time Interrogation Session: 20220309230652
Implantable Pulse Generator Implant Date: 20201106

## 2020-07-10 ENCOUNTER — Ambulatory Visit: Payer: Medicare Other | Admitting: Family

## 2020-07-14 NOTE — Progress Notes (Signed)
Carelink Summary Report / Loop Recorder 

## 2020-07-24 ENCOUNTER — Telehealth: Payer: Self-pay

## 2020-07-24 NOTE — Telephone Encounter (Signed)
Noted  

## 2020-07-24 NOTE — Telephone Encounter (Signed)
FYI: patient had a missed occupational therapy visit this week and will resume services next week, at the request of his family member.

## 2020-08-02 ENCOUNTER — Other Ambulatory Visit: Payer: Self-pay | Admitting: Family

## 2020-08-02 DIAGNOSIS — E1142 Type 2 diabetes mellitus with diabetic polyneuropathy: Secondary | ICD-10-CM

## 2020-08-06 ENCOUNTER — Ambulatory Visit (INDEPENDENT_AMBULATORY_CARE_PROVIDER_SITE_OTHER): Payer: Medicare Other

## 2020-08-06 DIAGNOSIS — I639 Cerebral infarction, unspecified: Secondary | ICD-10-CM | POA: Diagnosis not present

## 2020-08-09 ENCOUNTER — Emergency Department (HOSPITAL_COMMUNITY): Payer: Medicare Other

## 2020-08-09 ENCOUNTER — Emergency Department (HOSPITAL_COMMUNITY)
Admission: EM | Admit: 2020-08-09 | Discharge: 2020-08-10 | Disposition: A | Discharge status: Home / Self Care | Payer: Medicare Other | Attending: Emergency Medicine | Admitting: Emergency Medicine

## 2020-08-09 DIAGNOSIS — Z20822 Contact with and (suspected) exposure to covid-19: Secondary | ICD-10-CM | POA: Insufficient documentation

## 2020-08-09 DIAGNOSIS — Z79899 Other long term (current) drug therapy: Secondary | ICD-10-CM | POA: Insufficient documentation

## 2020-08-09 DIAGNOSIS — E876 Hypokalemia: Secondary | ICD-10-CM | POA: Diagnosis not present

## 2020-08-09 DIAGNOSIS — I10 Essential (primary) hypertension: Secondary | ICD-10-CM | POA: Insufficient documentation

## 2020-08-09 DIAGNOSIS — R569 Unspecified convulsions: Secondary | ICD-10-CM

## 2020-08-09 DIAGNOSIS — Z7982 Long term (current) use of aspirin: Secondary | ICD-10-CM | POA: Diagnosis not present

## 2020-08-09 DIAGNOSIS — G40909 Epilepsy, unspecified, not intractable, without status epilepticus: Secondary | ICD-10-CM | POA: Diagnosis not present

## 2020-08-09 DIAGNOSIS — E1140 Type 2 diabetes mellitus with diabetic neuropathy, unspecified: Secondary | ICD-10-CM | POA: Insufficient documentation

## 2020-08-09 DIAGNOSIS — Z794 Long term (current) use of insulin: Secondary | ICD-10-CM | POA: Diagnosis not present

## 2020-08-09 DIAGNOSIS — R4182 Altered mental status, unspecified: Secondary | ICD-10-CM | POA: Insufficient documentation

## 2020-08-09 DIAGNOSIS — Z7984 Long term (current) use of oral hypoglycemic drugs: Secondary | ICD-10-CM | POA: Diagnosis not present

## 2020-08-09 LAB — COMPREHENSIVE METABOLIC PANEL
ALT: 32 U/L (ref 0–44)
AST: 16 U/L (ref 15–41)
Albumin: 2.8 g/dL — ABNORMAL LOW (ref 3.5–5.0)
Alkaline Phosphatase: 60 U/L (ref 38–126)
Anion gap: 4 — ABNORMAL LOW (ref 5–15)
BUN: 15 mg/dL (ref 8–23)
CO2: 26 mmol/L (ref 22–32)
Calcium: 7.4 mg/dL — ABNORMAL LOW (ref 8.9–10.3)
Chloride: 114 mmol/L — ABNORMAL HIGH (ref 98–111)
Creatinine, Ser: 0.88 mg/dL (ref 0.61–1.24)
GFR, Estimated: >60 mL/min (ref >60)
Glucose, Bld: 89 mg/dL (ref 70–99)
Potassium: 2.9 mmol/L — ABNORMAL LOW (ref 3.5–5.1)
Sodium: 144 mmol/L (ref 135–145)
Total Bilirubin: 0.8 mg/dL (ref 0.3–1.2)
Total Protein: 4.9 g/dL — ABNORMAL LOW (ref 6.5–8.1)

## 2020-08-09 LAB — MAGNESIUM: Magnesium: 1.6 mg/dL — ABNORMAL LOW (ref 1.7–2.4)

## 2020-08-09 LAB — URINALYSIS, ROUTINE W REFLEX MICROSCOPIC
Bilirubin Urine: NEGATIVE
Glucose, UA: NEGATIVE mg/dL
Hgb urine dipstick: NEGATIVE
Ketones, ur: NEGATIVE mg/dL
Leukocytes,Ua: NEGATIVE
Nitrite: NEGATIVE
Protein, ur: NEGATIVE mg/dL
Specific Gravity, Urine: 1.013 (ref 1.005–1.030)
pH: 6 (ref 5.0–8.0)

## 2020-08-09 LAB — CBC WITH DIFFERENTIAL/PLATELET
Abs Immature Granulocytes: 0.02 10*3/uL (ref 0.00–0.07)
Basophils Absolute: 0 10*3/uL (ref 0.0–0.1)
Basophils Relative: 1 %
Eosinophils Absolute: 0.1 10*3/uL (ref 0.0–0.5)
Eosinophils Relative: 3 %
HCT: 35 % — ABNORMAL LOW (ref 39.0–52.0)
Hemoglobin: 11 g/dL — ABNORMAL LOW (ref 13.0–17.0)
Immature Granulocytes: 1 %
Lymphocytes Relative: 26 %
Lymphs Abs: 1 10*3/uL (ref 0.7–4.0)
MCH: 29.6 pg (ref 26.0–34.0)
MCHC: 31.4 g/dL (ref 30.0–36.0)
MCV: 94.3 fL (ref 80.0–100.0)
Monocytes Absolute: 0.3 10*3/uL (ref 0.1–1.0)
Monocytes Relative: 8 %
Neutro Abs: 2.3 10*3/uL (ref 1.7–7.7)
Neutrophils Relative %: 61 %
Platelets: 157 10*3/uL (ref 150–400)
RBC: 3.71 MIL/uL — ABNORMAL LOW (ref 4.22–5.81)
RDW: 13.4 % (ref 11.5–15.5)
WBC: 3.8 10*3/uL — ABNORMAL LOW (ref 4.0–10.5)
nRBC: 0 % (ref 0.0–0.2)

## 2020-08-09 LAB — CBG MONITORING, ED: Glucose-Capillary: 126 mg/dL — ABNORMAL HIGH (ref 70–99)

## 2020-08-09 LAB — RESP PANEL BY RT-PCR (FLU A&B, COVID) ARPGX2
Influenza A by PCR: NEGATIVE
Influenza B by PCR: NEGATIVE
SARS Coronavirus 2 by RT PCR: NEGATIVE

## 2020-08-09 LAB — LACTIC ACID, PLASMA: Lactic Acid, Venous: 0.8 mmol/L (ref 0.5–1.9)

## 2020-08-09 MED ORDER — SODIUM CHLORIDE 0.9 % IV BOLUS
500.0000 mL | Freq: Once | INTRAVENOUS | Status: AC
  Administered 2020-08-09: 500 mL via INTRAVENOUS

## 2020-08-09 MED ORDER — LAMOTRIGINE 150 MG PO TABS
150.0000 mg | ORAL_TABLET | Freq: Once | ORAL | Status: AC
  Administered 2020-08-09: 150 mg via ORAL
  Filled 2020-08-09 (×2): qty 1

## 2020-08-09 MED ORDER — POTASSIUM CHLORIDE CRYS ER 20 MEQ PO TBCR
40.0000 meq | EXTENDED_RELEASE_TABLET | Freq: Once | ORAL | Status: AC
  Administered 2020-08-09: 40 meq via ORAL
  Filled 2020-08-09: qty 2

## 2020-08-09 MED ORDER — MAGNESIUM SULFATE 2 GM/50ML IV SOLN
2.0000 g | Freq: Once | INTRAVENOUS | Status: AC
  Administered 2020-08-09: 2 g via INTRAVENOUS
  Filled 2020-08-09: qty 50

## 2020-08-09 MED ORDER — CARVEDILOL 12.5 MG PO TABS
12.5000 mg | ORAL_TABLET | Freq: Once | ORAL | Status: AC
  Administered 2020-08-09: 12.5 mg via ORAL
  Filled 2020-08-09: qty 1

## 2020-08-09 MED ORDER — POTASSIUM CHLORIDE 10 MEQ/100ML IV SOLN
10.0000 meq | Freq: Once | INTRAVENOUS | Status: AC
  Administered 2020-08-09: 10 meq via INTRAVENOUS
  Filled 2020-08-09: qty 100

## 2020-08-09 NOTE — ED Notes (Signed)
Pt transported to ct scan

## 2020-08-09 NOTE — ED Triage Notes (Signed)
Pt arrived to ED via ems from home. Daughter called for witness focal seizure, states pt staring off for a few minutes and then started to yell. Per daughter pt remains altered from his baseline, which is non verbal. Pt with hx of previous stroke and deficits on right side. Does have hx of seizures last one over a year ago.

## 2020-08-09 NOTE — ED Notes (Signed)
Lab to add on Magnesium 

## 2020-08-09 NOTE — ED Notes (Signed)
Called [facility]; pt. Is next in line.

## 2020-08-09 NOTE — ED Notes (Signed)
This RN spoke to pts daughter. Pt will need to go home by [facility]. Secretary to call [facility] for pt. Pt family would like to be notified when pt is heading home.

## 2020-08-09 NOTE — ED Notes (Signed)
Per Dr. Particia Nearing pt can eat and drink.

## 2020-08-09 NOTE — ED Notes (Signed)
This RN and Morrie Sheldon NT changed pt and placed pt on condom cath. Pt had urinated on himself. Sheets changed and new brief placed on pt.

## 2020-08-09 NOTE — ED Provider Notes (Signed)
[facility] EMERGENCY DEPARTMENT Provider Note   CSN: [id number] Arrival date & time: 08/09/20  1654     History Chief Complaint  Patient presents with  . Altered Mental Status    Douglas [name]. is a 70 y.o. male.  Pt presents to the ED today with AMS.  Pt brought in by EMS due to a possible syncopal event vs. Seizure.  Pt does have a seizure hx.  Pt has a hx of CVA and is nonverbal.  No other hx available.  Pt is now awake and alert.    I spoke with pt's daughter.  She said he did not sleep last night.  She described the spell as he started grabbing onto a table and was looking up at the ceiling and not responding to her.  This lasted a few minutes.  After that, he started yelling and seemed to be angry.  She said he is at his normal baseline now.        Past Medical History:  Diagnosis Date  . Acute blood loss anemia   . Acute embolic stroke (HCC)   . Acute lower UTI   . AMS (altered mental status) 06/05/2019  . Cerebral edema (HCC) 04/04/2018  . Diabetes 1.5, managed as type 2 (HCC)   . Diabetes mellitus without complication (HCC)   . High cholesterol   . High glucose    Diabetes  . History of CVA with residual deficit   . History of Dysphagia, post-stroke   . Hypertension   . Ischemic stroke (HCC) 03/05/2019  . Labile blood glucose   . 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 Renal insufficiency 04/04/2018  . Seizures (HCC)   . Stroke (HCC)   . UTI (urinary tract infection) 03/05/2019    Patient Active Problem List   Diagnosis Date Noted  . Decubitus ulcer of sacral region, stage 2 (HCC) 04/27/2020  . Protein-calorie malnutrition, severe 04/23/2020  . Physical debility 04/22/2020  . Hyperlipidemia 04/20/2020  . Slow transit constipation 12/26/2019  . Seizure (HCC) 11/20/2019  . Seizures (HCC) 11/20/2019  . Encephalopathy 11/19/2019  . Delayed orthostatic hypotension 08/04/2019  . Seizure disorder (HCC) 07/21/2019  . 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 Shoulder subluxation, right, sequela 07/16/2019  . Altered mental status 07/15/2019  . 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 Late effects of CVA (cerebrovascular accident)   . Pressure injury of skin 06/08/2019  . History of loop recorder 03/08/2019  . UTI (urinary tract infection) 03/05/2019  . 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 Global aphasia   . Type 2 diabetes mellitus with peripheral neuropathy (HCC)   . Hypertension associated with diabetes (HCC)   . Hyperlipidemia associated with type 2 diabetes mellitus (HCC)   . History of CVA (cerebrovascular accident)   . History of Dysphagia, post-stroke     Past Surgical History:  Procedure Laterality Date  . IR PATIENT EVAL TECH 0-60 MINS  03/30/2018  . LOOP RECORDER INSERTION N/A 03/08/2019   Procedure: LOOP RECORDER INSERTION;  Surgeon: Regan Lemming, MD;  Location: [facility] INVASIVE CV LAB;  Service: Cardiovascular;  Laterality: N/A;  . TEE WITHOUT CARDIOVERSION N/A 04/02/2018   Procedure: TRANSESOPHAGEAL ECHOCARDIOGRAM (TEE);  Surgeon: [name], MD;  Location: [facility] ENDOSCOPY;  Service: Cardiovascular;  Laterality: N/A;       Family History  Problem Relation Age of Onset  . Leukemia  Mother   . Dementia Father     Social History   Tobacco Use  . Smoking status: Never Smoker  . Smokeless tobacco: Never Used  Vaping Use  . Vaping Use: Never used  Substance Use Topics  . Alcohol use: Not Currently  . Drug use: Never    Home Medications Prior to Admission medications   Medication Sig Start Date End Date Taking? Authorizing Provider  amLODipine (NORVASC) 10 MG tablet Take 1 tablet (10 mg total) by mouth daily. 03/06/20  Yes Ngetich, Dinah C, NP  ascorbic acid (VITAMIN C) 500 MG tablet Take 1 tablet (500 mg total) by mouth daily as needed (if cold-like symptoms are present). 08/30/19  Yes  Margit Hanks, MD  aspirin EC 325 MG tablet Take 325 mg by mouth every morning.   Yes [provider]  atorvastatin (LIPITOR) 40 MG tablet Take 1 tablet (40 mg total) by mouth daily at 6 (six) AM. 12/26/19  Yes Ngetich, Dinah C, NP  baclofen (LIORESAL) 20 MG tablet TAKE 1 TABLET BY MOUTH TWICE A DAY Patient taking differently: Take 20 mg by mouth 2 (two) times daily. 06/01/20  Yes Ngetich, Dinah C, NP  carvedilol (COREG) 12.5 MG tablet TAKE 1 TABLET (12.5 MG TOTAL) BY MOUTH 2 (TWO) TIMES DAILY WITH A MEAL. 04/27/20  Yes Ngetich, Dinah C, NP  clopidogrel (PLAVIX) 75 MG tablet Take 1 tablet (75 mg total) by mouth daily. 12/26/19  Yes Ngetich, Dinah C, NP  glipiZIDE (GLUCOTROL) 10 MG tablet TAKE 1 TABLET BY MOUTH TWICE A DAY WITH A MEAL Patient taking differently: Take 10 mg by mouth 2 (two) times daily before a meal. 06/01/20  Yes Ngetich, Dinah C, NP  indapamide (LOZOL) 1.25 MG tablet Take 1.25 mg by mouth daily. 06/01/20  Yes [provider]  insulin detemir (LEVEMIR) 100 UNIT/ML injection Inject 0.05 mLs (5 Units total) into the skin daily. If CBG >125 (per daughter) 04/27/20  Yes Lewie Chamber, MD  ketoconazole (NIZORAL) 2 % cream Apply 1 application topically daily. 06/01/20  Yes McDonald, Rachelle Hora, DPM  lamoTRIgine (LAMICTAL) 150 MG tablet TAKE 1 TABLET BY MOUTH TWICE A DAY 06/01/20  Yes Ngetich, Dinah C, NP  midodrine (PROAMATINE) 5 MG tablet Take 5 mg by mouth 3 (three) times daily with meals. 06/03/20  Yes [provider]  NOVOLOG FLEXPEN 100 UNIT/ML FlexPen INJECT 2-15 UNITS INTO THE SKIN 4 TIMES DAILY - BEFORE MEALS AND AT BEDTIME. SSI: 121-150 = 2 UNITS 151-200 = 3 UNITS, 201-250 = 5 UNITS, 251-300 = 8 UNITS, 301-350 = 11 UNITS, 351-300 = 15 UNITS, GREATER THAN 400 = 15 UNITS AND CALL MD Patient taking differently: Inject 2-15 Units into the skin in the morning, at noon, in the evening, and at bedtime. 08/03/20  Yes Ngetich, Dinah C, NP  traZODone (DESYREL) 50 MG tablet  TAKE 1/2-1 TABLET BY MOUTH AT BEDTIME AS NEEDED FOR SLEEP. Patient taking differently: Take by mouth at bedtime. 03/30/20  Yes Ngetich, Dinah C, NP  Vitamin D3 (VITAMIN D) 25 MCG tablet Take 0.5 tablets (500 Units total) by mouth daily. 08/30/19  Yes Margit Hanks, MD  ciclopirox [facility]) 8 % solution Apply topically at bedtime. Apply over nail and surrounding skin. Apply daily over previous coat. After seven (7) days, may remove with alcohol and continue cycle. Patient not taking: Reported on 08/09/2020 06/01/20   Edwin Cap, DPM  linagliptin (TRADJENTA) 5 MG TABS tablet Take 1 tablet (5 mg total) by mouth  daily. Patient not taking: Reported on 08/09/2020 04/28/20   Lewie ChamberGirguis, David, MD  liver oil-zinc oxide (DESITIN) 40 % ointment Apply 1 application topically 2 (two) times daily as needed for irritation. Patient not taking: Reported on 08/09/2020 12/26/19   Ngetich, Dinah C, NP  senna-docusate (SENOKOT-S) 8.6-50 MG tablet Take 1 tablet by mouth at bedtime as needed for mild constipation. Patient not taking: Reported on 08/09/2020 04/27/20   Lewie ChamberGirguis, David, MD  tamsulosin (FLOMAX) 0.4 MG CAPS capsule Take 1 capsule (0.4 mg total) by mouth daily after supper. Patient not taking: Reported on 08/09/2020 12/27/19   Ngetich, Dinah C, NP    Allergies    Ciprofloxacin, Keppra [levetiracetam], Avocado, and Metformin and related  Review of Systems   Review of Systems  Unable to perform ROS: Patient nonverbal    Physical Exam Updated Vital Signs BP (!) 154/90   Pulse 68   Temp (!) 97.3 F (36.3 C) (Rectal)   Resp 10   SpO2 100%   Physical Exam Vitals and nursing note reviewed.  Constitutional:      Appearance: Normal appearance.  HENT:     Head: Normocephalic and atraumatic.     Right Ear: External ear normal.     Left Ear: External ear normal.     Nose: Nose normal.     Mouth/Throat:     Mouth: Mucous membranes are dry.  Eyes:     Pupils: Pupils are equal, round, and reactive to  light.  Cardiovascular:     Rate and Rhythm: Normal rate and regular rhythm.     Pulses: Normal pulses.     Heart sounds: Normal heart sounds.  Pulmonary:     Effort: Pulmonary effort is normal.     Breath sounds: Normal breath sounds.  Abdominal:     General: Abdomen is flat. Bowel sounds are normal.     Palpations: Abdomen is soft.  Musculoskeletal:     Cervical back: Normal range of motion and neck supple.     Comments: RUE contracture  Skin:    General: Skin is warm.     Capillary Refill: Capillary refill takes less than 2 seconds.  Neurological:     Mental Status: He is alert. Mental status is at baseline.     Comments: Nonverbal and right sided weakness (chronic).  Baseline per EMS.  Psychiatric:        Mood and Affect: Mood normal.     ED Results / Procedures / Treatments   Labs (all labs ordered are listed, but only abnormal results are displayed) Labs Reviewed  CBC WITH DIFFERENTIAL/PLATELET - Abnormal; Notable for the following components:      Result Value   WBC 3.8 (*)    RBC 3.71 (*)    Hemoglobin 11.0 (*)    HCT 35.0 (*)    All other components within normal limits  COMPREHENSIVE METABOLIC PANEL - Abnormal; Notable for the following components:   Potassium 2.9 (*)    Chloride 114 (*)    Calcium 7.4 (*)    Total Protein 4.9 (*)    Albumin 2.8 (*)    Anion gap 4 (*)    All other components within normal limits  URINALYSIS, ROUTINE W REFLEX MICROSCOPIC - Abnormal; Notable for the following components:   Color, Urine STRAW (*)    All other components within normal limits  MAGNESIUM - Abnormal; Notable for the following components:   Magnesium 1.6 (*)    All other components within normal limits  CBG MONITORING, ED - Abnormal; Notable for the following components:   Glucose-Capillary 126 (*)    All other components within normal limits  RESP PANEL BY RT-PCR (FLU A&B, COVID) [id number]  LACTIC ACID, PLASMA    EKG EKG Interpretation  Date/Time:  Sunday  August 09 2020 17:06:04 EDT Ventricular Rate:  124 PR Interval:    QRS Duration: 120 QT Interval:  354 QTC Calculation: 426 R Axis:   66 Text Interpretation: Atrial fibrillation Ventricular premature complex Incomplete left bundle branch block Anterior Q waves, possibly due to ILBBB ST elevation, consider inferior injury Artifact in lead(s) I II III aVR aVL aVF V1 V2 V3 V4 V5 V6 Poor data quality in current ECG precludes serial comparison Confirmed by Jacalyn Lefevre [phone]) on 08/09/2020 5:17:06 PM   Radiology CT Head Wo Contrast  Result Date: 08/09/2020 CLINICAL DATA:  Mental status change. EXAM: CT HEAD WITHOUT CONTRAST TECHNIQUE: Contiguous axial images were obtained from the base of the skull through the vertex without intravenous contrast. COMPARISON:  May 21, 2020 FINDINGS: Brain: No evidence of acute infarction, or hemorrhage. Redemonstrated is large chronic left MCA and PCA territory infarct with ex vacuo dilatation of the left lateral ventricle. Vascular: Calcific atherosclerotic disease of the intra cavernous carotid arteries. Skull: Normal. Negative for fracture or focal lesion. Sinuses/Orbits: No acute finding. Other: None. IMPRESSION: 1. No acute intracranial abnormality. 2. Large chronic left MCA and PCA territory infarct. Electronically Signed   By: Ted Mcalpine M.D.   On: 08/09/2020 18:19   DG Chest Portable 1 View  Result Date: 08/09/2020 CLINICAL DATA:  Altered level of consciousness. EXAM: PORTABLE CHEST 1 VIEW COMPARISON:  May 29, 2020 FINDINGS: Loop recorder as before. Cardiomediastinal silhouette is normal. Mediastinal contours appear intact. Calcific atherosclerotic disease and tortuosity of the aorta. There is no evidence of focal airspace consolidation, pleural effusion or pneumothorax. Approximately 1 cm nodular density in the right lower thorax, etiology uncertain. Stable biapical pleural thickening. Osseous structures are without acute abnormality. Soft  tissues are grossly normal. IMPRESSION: Approximately 1 cm nodular density in the right lower thorax, etiology uncertain. Further nonemergent evaluation with chest CT may be considered. Otherwise no evidence of airspace consolidation. Electronically Signed   By: Ted Mcalpine M.D.   On: 08/09/2020 17:48    Procedures Procedures   Medications Ordered in ED Medications  potassium chloride 10 mEq in 100 mL IVPB (10 mEq Intravenous New Bag/Given 08/09/20 2053)  magnesium sulfate IVPB 2 g 50 mL (2 g Intravenous New Bag/Given 08/09/20 2056)  sodium chloride 0.9 % bolus 500 mL (0 mLs Intravenous Stopped 08/09/20 2027)  potassium chloride SA (KLOR-CON) CR tablet 40 mEq (40 mEq Oral Given 08/09/20 2111)  carvedilol (COREG) tablet 12.5 mg (12.5 mg Oral Given 08/09/20 2133)  lamoTRIgine (LAMICTAL) tablet 150 mg (150 mg Oral Given 08/09/20 2133)    ED Course  I have reviewed the triage vital signs and the nursing notes.  Pertinent labs & imaging results that were available during my care of the patient were reviewed by me and considered in my medical decision making (see chart for details).    MDM Rules/Calculators/A&P                          Pt is eating and drinking without problems.  He has been at his baseline since arrival here.  K and Mg are both low and are both replaced.  No sign of fever.  I suspect  pt had a seizure.  [name] said he does not have a neurologist, so I am going to refer him to neurology.  Return if worse. Final Clinical Impression(s) / ED Diagnoses Final diagnoses:  Hypokalemia  Hypomagnesemia  Seizure (HCC)    Rx / DC Orders ED Discharge Orders         Ordered    Ambulatory referral to Neurology       Comments: An appointment is requested in approximately: 1 week   08/09/20 2152           Jacalyn Lefevre, MD 08/09/20 2153

## 2020-08-10 NOTE — ED Notes (Signed)
Patient verbalizes understanding of discharge instructions. Opportunity for questioning and answers were provided. Armband removed by staff, pt discharged from ED via [facility].  

## 2020-08-11 LAB — CUP PACEART REMOTE DEVICE CHECK
Date Time Interrogation Session: 20220411230638
Implantable Pulse Generator Implant Date: 20201106

## 2020-08-18 NOTE — Progress Notes (Signed)
Carelink Summary Report / Loop Recorder 

## 2020-08-27 ENCOUNTER — Other Ambulatory Visit: Payer: Self-pay

## 2020-08-27 ENCOUNTER — Emergency Department (HOSPITAL_COMMUNITY): Payer: Medicare Other

## 2020-08-27 ENCOUNTER — Emergency Department (HOSPITAL_COMMUNITY)
Admission: EM | Admit: 2020-08-27 | Discharge: 2020-08-27 | Disposition: A | Discharge status: Home / Self Care | Payer: Medicare Other | Attending: Emergency Medicine | Admitting: Emergency Medicine

## 2020-08-27 ENCOUNTER — Other Ambulatory Visit: Payer: Self-pay | Admitting: Family

## 2020-08-27 ENCOUNTER — Emergency Department (HOSPITAL_BASED_OUTPATIENT_CLINIC_OR_DEPARTMENT_OTHER): Payer: Medicare Other

## 2020-08-27 DIAGNOSIS — Z4502 Encounter for adjustment and management of automatic implantable cardiac defibrillator: Secondary | ICD-10-CM | POA: Insufficient documentation

## 2020-08-27 DIAGNOSIS — Z794 Long term (current) use of insulin: Secondary | ICD-10-CM | POA: Diagnosis not present

## 2020-08-27 DIAGNOSIS — M79662 Pain in left lower leg: Secondary | ICD-10-CM | POA: Insufficient documentation

## 2020-08-27 DIAGNOSIS — I951 Orthostatic hypotension: Secondary | ICD-10-CM

## 2020-08-27 DIAGNOSIS — R451 Restlessness and agitation: Secondary | ICD-10-CM | POA: Insufficient documentation

## 2020-08-27 DIAGNOSIS — M79604 Pain in right leg: Secondary | ICD-10-CM | POA: Diagnosis not present

## 2020-08-27 DIAGNOSIS — E1140 Type 2 diabetes mellitus with diabetic neuropathy, unspecified: Secondary | ICD-10-CM | POA: Insufficient documentation

## 2020-08-27 DIAGNOSIS — Z79899 Other long term (current) drug therapy: Secondary | ICD-10-CM | POA: Diagnosis not present

## 2020-08-27 DIAGNOSIS — Z0000 Encounter for general adult medical examination without abnormal findings: Secondary | ICD-10-CM

## 2020-08-27 DIAGNOSIS — E1159 Type 2 diabetes mellitus with other circulatory complications: Secondary | ICD-10-CM

## 2020-08-27 DIAGNOSIS — E1142 Type 2 diabetes mellitus with diabetic polyneuropathy: Secondary | ICD-10-CM

## 2020-08-27 DIAGNOSIS — Z7982 Long term (current) use of aspirin: Secondary | ICD-10-CM | POA: Diagnosis not present

## 2020-08-27 LAB — URINALYSIS, ROUTINE W REFLEX MICROSCOPIC
Bilirubin Urine: NEGATIVE
Glucose, UA: NEGATIVE mg/dL
Ketones, ur: NEGATIVE mg/dL
Nitrite: NEGATIVE
Protein, ur: NEGATIVE mg/dL
Specific Gravity, Urine: 1.017 (ref 1.005–1.030)
pH: 7 (ref 5.0–8.0)

## 2020-08-27 MED ORDER — LAMOTRIGINE 150 MG PO TABS
150.0000 mg | ORAL_TABLET | Freq: Two times a day (BID) | ORAL | Status: DC
  Administered 2020-08-27: 150 mg via ORAL
  Filled 2020-08-27 (×3): qty 1

## 2020-08-27 MED ORDER — AMLODIPINE BESYLATE 5 MG PO TABS
10.0000 mg | ORAL_TABLET | Freq: Once | ORAL | Status: AC
  Administered 2020-08-27: 10 mg via ORAL
  Filled 2020-08-27: qty 2

## 2020-08-27 NOTE — Discharge Instructions (Addendum)
You have been seen and discharged from the emergency department.  Your imaging and lab results were normal.  Follow-up with your primary provider for reevaluation and further care. Take home medications as prescribed. If you have any worsening symptoms or further concerns for your health please return to an emergency department for further evaluation.

## 2020-08-27 NOTE — ED Provider Notes (Signed)
[facility] EMERGENCY DEPARTMENT Provider Note   CSN: [id number] Arrival date & time: 08/27/20  1155     History Chief Complaint  Patient presents with  . Leg Pain    Douglas [name]. is a 70 y.o. male.  HPI   70 year old male with past medical history of CVA who is nonverbal and nonambulatory presents the emergency department by EMS for possible reported leg pain.  Report from EMS is that family says the patient has been more agitated today and staring down at his leg.  There has been no known trauma or injury.  No reported fever or illness.  There is currently no family at bedside.  Patient every now and then says yes but seems to say yes to every question asked, he does not localize or point very well.  Past Medical History:  Diagnosis Date  . Acute blood loss anemia   . Acute embolic stroke (HCC)   . Acute lower UTI   . AMS (altered mental status) 06/05/2019  . Cerebral edema (HCC) 04/04/2018  . Diabetes 1.5, managed as type 2 (HCC)   . Diabetes mellitus without complication (HCC)   . High cholesterol   . High glucose    Diabetes  . History of CVA with residual deficit   . History of Dysphagia, post-stroke   . Hypertension   . Ischemic stroke (HCC) 03/05/2019  . Labile blood glucose   . 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 Renal insufficiency 04/04/2018  . Seizures (HCC)   . Stroke (HCC)   . UTI (urinary tract infection) 03/05/2019    Patient Active Problem List   Diagnosis Date Noted  . Decubitus ulcer of sacral region, stage 2 (HCC) 04/27/2020  . Protein-calorie malnutrition, severe 04/23/2020  . Physical debility 04/22/2020  . Hyperlipidemia 04/20/2020  . Slow transit constipation 12/26/2019  . Seizure (HCC) 11/20/2019  . Seizures (HCC) 11/20/2019  . Encephalopathy 11/19/2019  . Delayed orthostatic hypotension 08/04/2019  . Seizure disorder (HCC) 07/21/2019  . 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 Shoulder subluxation, right, sequela 07/16/2019  . Altered mental status 07/15/2019  . 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 Late effects of CVA (cerebrovascular accident)   . Pressure injury of skin 06/08/2019  . History of loop recorder 03/08/2019  . UTI (urinary tract infection) 03/05/2019  . 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 Global aphasia   . Type 2 diabetes mellitus with peripheral neuropathy (HCC)   . Hypertension associated with diabetes (HCC)   . Hyperlipidemia associated with type 2 diabetes mellitus (HCC)   . History of CVA (cerebrovascular accident)   . History of Dysphagia, post-stroke     Past Surgical History:  Procedure Laterality Date  . IR PATIENT EVAL TECH 0-60 MINS  03/30/2018  . LOOP RECORDER INSERTION N/A 03/08/2019   Procedure: LOOP RECORDER INSERTION;  Surgeon: Regan Lemming, MD;  [facility] INVASIVE CV LAB;  Service: Cardiovascular;  Laterality: N/A;  . TEE WITHOUT CARDIOVERSION N/A 04/02/2018   Procedure: TRANSESOPHAGEAL ECHOCARDIOGRAM (TEE);  Surgeon: [name], MD;  Location: [facility] ENDOSCOPY;  Service: Cardiovascular;  Laterality: N/A;       Family History  Problem Relation Age of Onset  . Leukemia Mother   . Dementia Father     Social History   Tobacco Use  . Smoking status: Never Smoker  . Smokeless tobacco: Never Used  Vaping  Use  . Vaping Use: Never used  Substance Use Topics  . Alcohol use: Not Currently  . Drug use: Never    Home Medications Prior to Admission medications   Medication Sig Start Date End Date Taking? Authorizing Provider  amLODipine (NORVASC) 10 MG tablet Take 1 tablet (10 mg total) by mouth daily. 03/06/20   Ngetich, Dinah C, NP  ascorbic acid (VITAMIN C) 500 MG tablet Take 1 tablet (500 mg total) by mouth daily as needed (if cold-like symptoms are present). 08/30/19   Margit Hanks, MD  aspirin EC 325 MG tablet Take 325 mg by  mouth every morning.    [provider]  atorvastatin (LIPITOR) 40 MG tablet Take 1 tablet (40 mg total) by mouth daily at 6 (six) AM. 12/26/19   Ngetich, Dinah C, NP  baclofen (LIORESAL) 20 MG tablet TAKE 1 TABLET BY MOUTH TWICE A DAY Patient taking differently: Take 20 mg by mouth 2 (two) times daily. 06/01/20   Ngetich, Dinah C, NP  carvedilol (COREG) 12.5 MG tablet TAKE 1 TABLET (12.5 MG TOTAL) BY MOUTH 2 (TWO) TIMES DAILY WITH A MEAL. 04/27/20   Ngetich, Dinah C, NP  ciclopirox (PENLAC) 8 % solution Apply topically at bedtime. Apply over nail and surrounding skin. Apply daily over previous coat. After seven (7) days, may remove with alcohol and continue cycle. Patient not taking: Reported on 08/09/2020 06/01/20   Edwin Cap, DPM  clopidogrel (PLAVIX) 75 MG tablet Take 1 tablet (75 mg total) by mouth daily. 12/26/19   Ngetich, Dinah C, NP  glipiZIDE (GLUCOTROL) 10 MG tablet Take 1 tablet (10 mg total) by mouth 2 (two) times daily with a meal. Needs an appointment before anymore future refills. 08/27/20   Ngetich, Dinah C, NP  indapamide (LOZOL) 1.25 MG tablet Take 1 tablet (1.25 mg total) by mouth daily. Needs an appointment before anymore future refill. 08/27/20   Ngetich, Dinah C, NP  insulin detemir (LEVEMIR) 100 UNIT/ML injection Inject 0.05 mLs (5 Units total) into the skin daily. If CBG >125 (per daughter) 04/27/20   Lewie Chamber, MD  ketoconazole (NIZORAL) 2 % cream Apply 1 application topically daily. 06/01/20   McDonald, Rachelle Hora, DPM  lamoTRIgine (LAMICTAL) 150 MG tablet TAKE 1 TABLET BY MOUTH TWICE A DAY 06/01/20   Ngetich, Dinah C, NP  linagliptin (TRADJENTA) 5 MG TABS tablet Take 1 tablet (5 mg total) by mouth daily. Patient not taking: Reported on 08/09/2020 04/28/20   Lewie Chamber, MD  liver oil-zinc oxide (DESITIN) 40 % ointment Apply 1 application topically 2 (two) times daily as needed for irritation. Patient not taking: Reported on 08/09/2020 12/26/19   Ngetich, Dinah C,  NP  midodrine (PROAMATINE) 5 MG tablet Take one tablet by mouth in the morning, at noon, and at bedtime for orthostatic Hypotension. Needs an appointment before anymore future refills. 08/27/20   Ngetich, Dinah C, NP  NOVOLOG FLEXPEN 100 UNIT/ML FlexPen INJECT 2-15 UNITS INTO THE SKIN 4 TIMES DAILY - BEFORE MEALS AND AT BEDTIME. SSI: 121-150 = 2 UNITS 151-200 = 3 UNITS, 201-250 = 5 UNITS, 251-300 = 8 UNITS, 301-350 = 11 UNITS, 351-300 = 15 UNITS, GREATER THAN 400 = 15 UNITS AND CALL MD Patient taking differently: Inject 2-15 Units into the skin in the morning, at noon, in the evening, and at bedtime. 08/03/20   Ngetich, Dinah C, NP  senna-docusate (SENOKOT-S) 8.6-50 MG tablet Take 1 tablet by mouth at bedtime as needed for mild constipation. Patient not  taking: Reported on 08/09/2020 04/27/20   Lewie ChamberGirguis, David, MD  tamsulosin (FLOMAX) 0.4 MG CAPS capsule Take 1 capsule (0.4 mg total) by mouth daily after supper. Patient not taking: Reported on 08/09/2020 12/27/19   Ngetich, Dinah C, NP  traZODone (DESYREL) 50 MG tablet TAKE 1/2-1 TABLET BY MOUTH AT BEDTIME AS NEEDED FOR SLEEP. Patient taking differently: Take by mouth at bedtime. 03/30/20   Ngetich, Dinah C, NP  Vitamin D3 (VITAMIN D) 25 MCG tablet Take 0.5 tablets (500 Units total) by mouth daily. 08/30/19   Margit HanksAlexander, Anne D, MD    Allergies    Ciprofloxacin, Keppra [levetiracetam], Avocado, and Metformin and related  Review of Systems   Review of Systems  Unable to perform ROS: Patient nonverbal    Physical Exam Updated Vital Signs BP (!) 142/86 (BP Location: Right Arm)   Pulse 71   Temp 98.1 F (36.7 C) (Oral)   Resp 15   Ht 6\' 1"  (1.854 m)   Wt 72 kg   SpO2 100%   BMI 20.94 kg/m   Physical Exam Vitals and nursing note reviewed.  Constitutional:      Appearance: Normal appearance. He is not ill-appearing, toxic-appearing or diaphoretic.     Comments: Thin, nonverbal, in no apparent distress  HENT:     Head: Normocephalic.      Mouth/Throat:     Mouth: Mucous membranes are moist.  Cardiovascular:     Rate and Rhythm: Normal rate.  Pulmonary:     Effort: Pulmonary effort is normal. No respiratory distress.  Abdominal:     Palpations: Abdomen is soft.     Tenderness: There is no abdominal tenderness.  Musculoskeletal:     Comments: Pelvis appears stable and nontender, patient points to his left leg when asked which leg hurts, patient does not appear to react to any palpation of the left lower extremity but seems to motion to his mid calf as to where the tenderness is on the left leg, equal palpable DP pulses, capillary refill is in tact, foot is not cold or discolored  Skin:    General: Skin is warm.  Neurological:     Mental Status: He is alert and oriented to person, place, and time. Mental status is at baseline.  Psychiatric:        Mood and Affect: Mood normal.     ED Results / Procedures / Treatments   Labs (all labs ordered are listed, but only abnormal results are displayed) Labs Reviewed - No data to display  EKG None  Radiology No results found.  Procedures Procedures   Medications Ordered in ED Medications - No data to display  ED Course  I have reviewed the triage vital signs and the nursing notes.  Pertinent labs & imaging results that were available during my care of the patient were reviewed by me and considered in my medical decision making (see chart for details).    MDM Rules/Calculators/A&P                          70 year old male presents the emergency department with reported concern for lower leg pain.  Report from EMS is that was his right leg, patient points to his left leg.  Both legs look unremarkable, they have equal palpable DP pulses, no signs of discoloration or problem with arterial blood flow, no deformity or swelling, there is maybe some tenderness to palpation in the left calf but this is very hard  to delineate as the patient is nonverbal.  Pelvis x-ray is  unremarkable, I have low suspicion for fracture based off of physical exam.  Ultrasound shows no DVT.  The daughter was here briefly and spoke to nursing staff, was concerned that he "just was not acting himself". No other report of fall, head injury, acute neurologic change per daughter.  Besides the agitation he is otherwise reported as baseline.  For this reason a urinalysis was done, shows no urinary tract infection, urine culture will be sent as the patient is nonverbal and I cannot evaluate for symptomatic.   I spoke with the daughter Douglas [name].  I went over his work-up and negative results.  Plan for outpatient follow-up and care.  No other indication for emergent lab/imaging.  Vitals have remained stable, his physical exam is very reassuring and appears baseline.  Patient will be discharged and treated as an outpatient.  Discharge plan and strict return to ED precautions discussed, patient verbalizes understanding and agreement.    Final Clinical Impression(s) / ED Diagnoses Final diagnoses:  None    Rx / DC Orders ED Discharge Orders    None       Rozelle Logan, DO 08/27/20 1526

## 2020-08-27 NOTE — ED Triage Notes (Addendum)
Pt BIB [facility] from home. Per family patient has been agitated today and staring at his R leg. Per family it seems that the patient may be experiencing some type of leg pain. Pt has hx of prior stroke and is nonverbal at baseline, only shaking his head yes when asked any question. However at this time he is unable to state if he is in pain currently. Pt currently appears to be in no distress at this time. VSS w/ EMS.

## 2020-08-27 NOTE — ED Notes (Signed)
pts daughter called x3 for discharge, voicemail left.

## 2020-08-27 NOTE — Progress Notes (Incomplete)
Venous lower ext .  has been completed. Refer to [facility] under chart review to view preliminary results.   08/27/2020  2:05 PM Zosia Lucchese, Gerarda Gunther

## 2020-08-27 NOTE — ED Notes (Signed)
ptar called 

## 2020-08-28 ENCOUNTER — Other Ambulatory Visit: Payer: Self-pay | Admitting: Family

## 2020-08-28 DIAGNOSIS — I152 Hypertension secondary to endocrine disorders: Secondary | ICD-10-CM

## 2020-08-28 DIAGNOSIS — I6990 Unspecified sequelae of unspecified cerebrovascular disease: Secondary | ICD-10-CM

## 2020-08-28 DIAGNOSIS — E1159 Type 2 diabetes mellitus with other circulatory complications: Secondary | ICD-10-CM

## 2020-08-28 NOTE — Telephone Encounter (Signed)
Patient hasn't been seen since May 2021. To Douglas [name].

## 2020-08-28 NOTE — ED Notes (Signed)
Patient denies pain and is resting comfortably.  

## 2020-08-30 LAB — URINE CULTURE: Culture: >=100000 — AB

## 2020-08-31 ENCOUNTER — Telehealth: Payer: Self-pay | Admitting: Emergency Medicine

## 2020-08-31 NOTE — Telephone Encounter (Signed)
Post ED Visit - Positive Culture Follow-up  Culture report reviewed by antimicrobial stewardship pharmacist: [facility] []  , Pharm.D. []  Enzo Bi, Pharm.D., BCPS AQ-ID []  , Pharm.D., BCPS []  Celedonio Miyamoto, Pharm.D., BCPS []  [place], Garvin Fila.D., BCPS, AAHIVP []  , Pharm.D., BCPS, AAHIVP []  Georgina Pillion, PharmD, BCPS []  , PharmD, BCPS []  [place], PharmD, BCPS []  [place], PharmD []  , PharmD, BCPS []  Estella Husk, PharmD  Pharmacy Team []  Lysle Pearl, PharmD []  , PharmD []  Phillips Climes, PharmD []  , Rph []  Agapito Games) , PharmD []  Verlan Friends, PharmD []  , PharmD []  Mervyn Gay, PharmD []  , PharmD []  Vinnie Level, PharmD []  [facility], PharmD []  , PharmD []  Len Childs, PharmD   Positive urine culture Treated with none, asymptomatic and no further patient follow-up is required at this time.  08/31/2020, 12:29 PM

## 2020-09-06 ENCOUNTER — Emergency Department (HOSPITAL_COMMUNITY): Payer: Medicare Other

## 2020-09-06 ENCOUNTER — Encounter (HOSPITAL_COMMUNITY): Payer: Self-pay | Admitting: Emergency Medicine

## 2020-09-06 ENCOUNTER — Other Ambulatory Visit: Payer: Self-pay

## 2020-09-06 ENCOUNTER — Inpatient Hospital Stay (HOSPITAL_COMMUNITY)
Admission: EM | Admit: 2020-09-06 | Discharge: 2020-09-13 | DRG: 689 | Disposition: A | Discharge status: Home Health | Payer: Medicare Other | Attending: Family Medicine | Admitting: Family Medicine

## 2020-09-06 DIAGNOSIS — E785 Hyperlipidemia, unspecified: Secondary | ICD-10-CM | POA: Diagnosis present

## 2020-09-06 DIAGNOSIS — Z79899 Other long term (current) drug therapy: Secondary | ICD-10-CM

## 2020-09-06 DIAGNOSIS — I69351 Hemiplegia and hemiparesis following cerebral infarction affecting right dominant side: Secondary | ICD-10-CM

## 2020-09-06 DIAGNOSIS — R4182 Altered mental status, unspecified: Secondary | ICD-10-CM | POA: Diagnosis not present

## 2020-09-06 DIAGNOSIS — Z7902 Long term (current) use of antithrombotics/antiplatelets: Secondary | ICD-10-CM | POA: Diagnosis not present

## 2020-09-06 DIAGNOSIS — N400 Enlarged prostate without lower urinary tract symptoms: Secondary | ICD-10-CM | POA: Diagnosis present

## 2020-09-06 DIAGNOSIS — E7800 Pure hypercholesterolemia, unspecified: Secondary | ICD-10-CM | POA: Diagnosis present

## 2020-09-06 DIAGNOSIS — F0150 Vascular dementia without behavioral disturbance: Secondary | ICD-10-CM | POA: Diagnosis present

## 2020-09-06 DIAGNOSIS — R1310 Dysphagia, unspecified: Secondary | ICD-10-CM | POA: Diagnosis present

## 2020-09-06 DIAGNOSIS — G9340 Encephalopathy, unspecified: Secondary | ICD-10-CM | POA: Diagnosis present

## 2020-09-06 DIAGNOSIS — N390 Urinary tract infection, site not specified: Secondary | ICD-10-CM

## 2020-09-06 DIAGNOSIS — R319 Hematuria, unspecified: Secondary | ICD-10-CM | POA: Diagnosis not present

## 2020-09-06 DIAGNOSIS — E1165 Type 2 diabetes mellitus with hyperglycemia: Secondary | ICD-10-CM | POA: Diagnosis present

## 2020-09-06 DIAGNOSIS — Z20822 Contact with and (suspected) exposure to covid-19: Secondary | ICD-10-CM | POA: Diagnosis present

## 2020-09-06 DIAGNOSIS — E1122 Type 2 diabetes mellitus with diabetic chronic kidney disease: Secondary | ICD-10-CM | POA: Diagnosis present

## 2020-09-06 DIAGNOSIS — Z7982 Long term (current) use of aspirin: Secondary | ICD-10-CM | POA: Diagnosis not present

## 2020-09-06 DIAGNOSIS — N3001 Acute cystitis with hematuria: Principal | ICD-10-CM | POA: Diagnosis present

## 2020-09-06 DIAGNOSIS — I129 Hypertensive chronic kidney disease with stage 1 through stage 4 chronic kidney disease, or unspecified chronic kidney disease: Secondary | ICD-10-CM | POA: Diagnosis present

## 2020-09-06 DIAGNOSIS — G9341 Metabolic encephalopathy: Secondary | ICD-10-CM | POA: Diagnosis present

## 2020-09-06 DIAGNOSIS — Z794 Long term (current) use of insulin: Secondary | ICD-10-CM | POA: Diagnosis not present

## 2020-09-06 DIAGNOSIS — N3000 Acute cystitis without hematuria: Secondary | ICD-10-CM | POA: Diagnosis not present

## 2020-09-06 DIAGNOSIS — B964 Proteus (mirabilis) (morganii) as the cause of diseases classified elsewhere: Secondary | ICD-10-CM | POA: Diagnosis present

## 2020-09-06 DIAGNOSIS — I639 Cerebral infarction, unspecified: Secondary | ICD-10-CM | POA: Diagnosis not present

## 2020-09-06 DIAGNOSIS — E43 Unspecified severe protein-calorie malnutrition: Secondary | ICD-10-CM | POA: Diagnosis present

## 2020-09-06 DIAGNOSIS — G40909 Epilepsy, unspecified, not intractable, without status epilepticus: Secondary | ICD-10-CM | POA: Diagnosis present

## 2020-09-06 DIAGNOSIS — N182 Chronic kidney disease, stage 2 (mild): Secondary | ICD-10-CM | POA: Diagnosis present

## 2020-09-06 DIAGNOSIS — N179 Acute kidney failure, unspecified: Secondary | ICD-10-CM | POA: Diagnosis present

## 2020-09-06 DIAGNOSIS — I951 Orthostatic hypotension: Secondary | ICD-10-CM | POA: Diagnosis present

## 2020-09-06 LAB — BASIC METABOLIC PANEL
Anion gap: 13 (ref 5–15)
BUN: 37 mg/dL — ABNORMAL HIGH (ref 8–23)
CO2: 22 mmol/L (ref 22–32)
Calcium: 9.6 mg/dL (ref 8.9–10.3)
Chloride: 104 mmol/L (ref 98–111)
Creatinine, Ser: 1.84 mg/dL — ABNORMAL HIGH (ref 0.61–1.24)
GFR, Estimated: 39 mL/min — ABNORMAL LOW (ref >60)
Glucose, Bld: 191 mg/dL — ABNORMAL HIGH (ref 70–99)
Potassium: 4.1 mmol/L (ref 3.5–5.1)
Sodium: 139 mmol/L (ref 135–145)

## 2020-09-06 LAB — CBC WITH DIFFERENTIAL/PLATELET
Abs Immature Granulocytes: 0.02 10*3/uL (ref 0.00–0.07)
Basophils Absolute: 0 10*3/uL (ref 0.0–0.1)
Basophils Relative: 0 %
Eosinophils Absolute: 0.1 10*3/uL (ref 0.0–0.5)
Eosinophils Relative: 1 %
HCT: 46.4 % (ref 39.0–52.0)
Hemoglobin: 14.7 g/dL (ref 13.0–17.0)
Immature Granulocytes: 0 %
Lymphocytes Relative: 26 %
Lymphs Abs: 1.5 10*3/uL (ref 0.7–4.0)
MCH: 29.7 pg (ref 26.0–34.0)
MCHC: 31.7 g/dL (ref 30.0–36.0)
MCV: 93.7 fL (ref 80.0–100.0)
Monocytes Absolute: 0.5 10*3/uL (ref 0.1–1.0)
Monocytes Relative: 8 %
Neutro Abs: 3.6 10*3/uL (ref 1.7–7.7)
Neutrophils Relative %: 65 %
Platelets: 212 10*3/uL (ref 150–400)
RBC: 4.95 MIL/uL (ref 4.22–5.81)
RDW: 13.7 % (ref 11.5–15.5)
WBC: 5.7 10*3/uL (ref 4.0–10.5)
nRBC: 0 % (ref 0.0–0.2)

## 2020-09-06 LAB — URINALYSIS, ROUTINE W REFLEX MICROSCOPIC
Bilirubin Urine: NEGATIVE
Glucose, UA: NEGATIVE mg/dL
Ketones, ur: NEGATIVE mg/dL
Nitrite: NEGATIVE
Protein, ur: NEGATIVE mg/dL
RBC / HPF: >50 RBC/hpf — ABNORMAL HIGH (ref 0–5)
Specific Gravity, Urine: 1.017 (ref 1.005–1.030)
pH: 5 (ref 5.0–8.0)

## 2020-09-06 LAB — GLUCOSE, CAPILLARY: Glucose-Capillary: 110 mg/dL — ABNORMAL HIGH (ref 70–99)

## 2020-09-06 MED ORDER — LAMOTRIGINE 25 MG PO TABS
150.0000 mg | ORAL_TABLET | Freq: Two times a day (BID) | ORAL | Status: DC
  Administered 2020-09-06 – 2020-09-13 (×14): 150 mg via ORAL
  Filled 2020-09-06 (×16): qty 1

## 2020-09-06 MED ORDER — CARVEDILOL 6.25 MG PO TABS
6.2500 mg | ORAL_TABLET | Freq: Two times a day (BID) | ORAL | Status: DC
  Administered 2020-09-07 – 2020-09-13 (×14): 6.25 mg via ORAL
  Filled 2020-09-06 (×14): qty 1

## 2020-09-06 MED ORDER — MIDODRINE HCL 5 MG PO TABS
2.5000 mg | ORAL_TABLET | Freq: Two times a day (BID) | ORAL | Status: DC
  Administered 2020-09-07 – 2020-09-13 (×12): 2.5 mg via ORAL
  Filled 2020-09-06 (×14): qty 1

## 2020-09-06 MED ORDER — HEPARIN SODIUM (PORCINE) 5000 UNIT/ML IJ SOLN
5000.0000 [IU] | Freq: Two times a day (BID) | INTRAMUSCULAR | Status: DC
  Administered 2020-09-06 – 2020-09-13 (×14): 5000 [IU] via SUBCUTANEOUS
  Filled 2020-09-06 (×14): qty 1

## 2020-09-06 MED ORDER — ASPIRIN EC 325 MG PO TBEC
325.0000 mg | DELAYED_RELEASE_TABLET | Freq: Every morning | ORAL | Status: DC
  Administered 2020-09-07 – 2020-09-13 (×7): 325 mg via ORAL
  Filled 2020-09-06 (×4): qty 1

## 2020-09-06 MED ORDER — TRAZODONE HCL 50 MG PO TABS
50.0000 mg | ORAL_TABLET | Freq: Every day | ORAL | Status: DC
  Administered 2020-09-06 – 2020-09-12 (×7): 50 mg via ORAL
  Filled 2020-09-06 (×7): qty 1

## 2020-09-06 MED ORDER — CLOPIDOGREL BISULFATE 75 MG PO TABS
75.0000 mg | ORAL_TABLET | Freq: Every day | ORAL | Status: DC
  Administered 2020-09-07 – 2020-09-13 (×7): 75 mg via ORAL
  Filled 2020-09-06 (×7): qty 1

## 2020-09-06 MED ORDER — ONDANSETRON HCL 4 MG PO TABS: 4.0000 mg | ORAL_TABLET | Freq: Four times a day (QID) | ORAL | Status: DC | PRN

## 2020-09-06 MED ORDER — ATORVASTATIN CALCIUM 40 MG PO TABS
40.0000 mg | ORAL_TABLET | Freq: Every day | ORAL | Status: DC
  Administered 2020-09-08 – 2020-09-13 (×6): 40 mg via ORAL
  Filled 2020-09-06 (×6): qty 1

## 2020-09-06 MED ORDER — INSULIN ASPART 100 UNIT/ML IJ SOLN
0.0000 [IU] | Freq: Three times a day (TID) | INTRAMUSCULAR | Status: DC
  Administered 2020-09-07: 2 [IU] via SUBCUTANEOUS
  Administered 2020-09-08: 1 [IU] via SUBCUTANEOUS
  Administered 2020-09-08 – 2020-09-09 (×4): 2 [IU] via SUBCUTANEOUS
  Administered 2020-09-10: 3 [IU] via SUBCUTANEOUS
  Administered 2020-09-10 – 2020-09-11 (×3): 2 [IU] via SUBCUTANEOUS
  Administered 2020-09-11: 1 [IU] via SUBCUTANEOUS
  Administered 2020-09-12: 3 [IU] via SUBCUTANEOUS
  Administered 2020-09-12 (×2): 2 [IU] via SUBCUTANEOUS
  Administered 2020-09-13: 3 [IU] via SUBCUTANEOUS
  Administered 2020-09-13 (×2): 2 [IU] via SUBCUTANEOUS

## 2020-09-06 MED ORDER — ONDANSETRON HCL 4 MG/2ML IJ SOLN
4.0000 mg | Freq: Four times a day (QID) | INTRAMUSCULAR | Status: DC | PRN
Start: 2020-09-06 — End: 2020-09-13

## 2020-09-06 MED ORDER — SODIUM CHLORIDE 0.9 % IV SOLN: INTRAVENOUS | Status: DC

## 2020-09-06 MED ORDER — SODIUM CHLORIDE 0.9 % IV BOLUS
1000.0000 mL | Freq: Once | INTRAVENOUS | Status: AC
  Administered 2020-09-06: 1000 mL via INTRAVENOUS

## 2020-09-06 MED ORDER — BACLOFEN 10 MG PO TABS
20.0000 mg | ORAL_TABLET | Freq: Two times a day (BID) | ORAL | Status: DC
  Administered 2020-09-06 – 2020-09-13 (×14): 20 mg via ORAL
  Filled 2020-09-06 (×8): qty 2
  Filled 2020-09-06: qty 1
  Filled 2020-09-06 (×6): qty 2

## 2020-09-06 MED ORDER — SODIUM CHLORIDE 0.9 % IV SOLN
1.0000 g | Freq: Once | INTRAVENOUS | Status: AC
  Administered 2020-09-06: 1 g via INTRAVENOUS
  Filled 2020-09-06: qty 10

## 2020-09-06 MED ORDER — SODIUM CHLORIDE 0.9 % IV SOLN
1.0000 g | INTRAVENOUS | Status: DC
  Administered 2020-09-07 – 2020-09-08 (×2): 1 g via INTRAVENOUS
  Filled 2020-09-06 (×2): qty 10

## 2020-09-06 NOTE — ED Notes (Signed)
Attempted to call report x1. Floor reviewing chart.

## 2020-09-06 NOTE — ED Notes (Signed)
Attempted to call 2W. No answer. Will attempt at later time.

## 2020-09-06 NOTE — ED Provider Notes (Addendum)
[facility] EMERGENCY DEPARTMENT Provider Note   CSN: [id number] Arrival date & time: 09/06/20  1342     History Chief Complaint  Patient presents with  . Altered Mental Status    Douglas [name]. is a 70 y.o. male.  70 year old male brought in by EMS from home, called out by family who reported patient was more agitated than usual and making eye contact less than normal.  Patient is nonverbal and nonambulatory at baseline.  Patient was seen in the emergency room about 10 days ago with similar complaint without cause found at that time.  Family is not at bedside at time of exam.  Level 5 caveat applies to this nonverbal patient.        Past Medical History:  Diagnosis Date  . Acute blood loss anemia   . Acute embolic stroke (HCC)   . Acute lower UTI   . AMS (altered mental status) 06/05/2019  . Cerebral edema (HCC) 04/04/2018  . Diabetes 1.5, managed as type 2 (HCC)   . Diabetes mellitus without complication (HCC)   . High cholesterol   . High glucose    Diabetes  . History of CVA with residual deficit   . History of Dysphagia, post-stroke   . Hypertension   . Ischemic stroke (HCC) 03/05/2019  . Labile blood glucose   . 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 Renal insufficiency 04/04/2018  . Seizures (HCC)   . Stroke (HCC)   . UTI (urinary tract infection) 03/05/2019    Patient Active Problem List   Diagnosis Date Noted  . Decubitus ulcer of sacral region, stage 2 (HCC) 04/27/2020  . Protein-calorie malnutrition, severe 04/23/2020  . Physical debility 04/22/2020  . Hyperlipidemia 04/20/2020  . Slow transit constipation 12/26/2019  . Seizure (HCC) 11/20/2019  . Seizures (HCC) 11/20/2019  . Encephalopathy 11/19/2019  . Delayed orthostatic hypotension 08/04/2019  . Seizure disorder (HCC) 07/21/2019  . 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 Shoulder subluxation, right, sequela 07/16/2019  . Altered mental  status 07/15/2019  . 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 Late effects of CVA (cerebrovascular accident)   . Pressure injury of skin 06/08/2019  . History of loop recorder 03/08/2019  . UTI (urinary tract infection) 03/05/2019  . 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 Global aphasia   . Type 2 diabetes mellitus with peripheral neuropathy (HCC)   . Hypertension associated with diabetes (HCC)   . Hyperlipidemia associated with type 2 diabetes mellitus (HCC)   . History of CVA (cerebrovascular accident)   . History of Dysphagia, post-stroke     Past Surgical History:  Procedure Laterality Date  . IR PATIENT EVAL TECH 0-60 MINS  03/30/2018  . LOOP RECORDER INSERTION N/A 03/08/2019   Procedure: LOOP RECORDER INSERTION;  Surgeon: Regan Lemming, MD;  Location: [facility] INVASIVE CV LAB;  Service: Cardiovascular;  Laterality: N/A;  . TEE WITHOUT CARDIOVERSION N/A 04/02/2018   Procedure: TRANSESOPHAGEAL ECHOCARDIOGRAM (TEE);  Surgeon: [name], MD;  Location: [facility] ENDOSCOPY;  Service: Cardiovascular;  Laterality: N/A;       Family History  Problem Relation Age of Onset  . Leukemia Mother   . Dementia Father     Social History   Tobacco Use  . Smoking status: Never Smoker  . Smokeless tobacco: Never Used  Vaping Use  . Vaping Use: Never used  Substance Use Topics  . Alcohol  use: Not Currently  . Drug use: Never    Home Medications Prior to Admission medications   Medication Sig Start Date End Date Taking? Authorizing Provider  amLODipine (NORVASC) 10 MG tablet Take 1 tablet (10 mg total) by mouth daily. 03/06/20   Ngetich, Dinah C, NP  ascorbic acid (VITAMIN C) 500 MG tablet Take 1 tablet (500 mg total) by mouth daily as needed (if cold-like symptoms are present). 08/30/19   Margit HanksAlexander, Anne D, MD  aspirin EC 325 MG tablet Take 325 mg by mouth every morning.    [provider]  atorvastatin (LIPITOR)  40 MG tablet Take 1 tablet (40 mg total) by mouth daily at 6 (six) AM. 12/26/19   Ngetich, Dinah C, NP  baclofen (LIORESAL) 20 MG tablet TAKE 1 TABLET BY MOUTH TWICE A DAY Patient taking differently: Take 20 mg by mouth 2 (two) times daily. 06/01/20   Ngetich, Dinah C, NP  carvedilol (COREG) 12.5 MG tablet Take 1 tablet (12.5 mg total) by mouth 2 (two) times daily with a meal. WILL NEED APPT FOR FUTURE REFILLS 08/28/20   Sharon SellerEubanks, Jessica K, NP  ciclopirox (PENLAC) 8 % solution Apply topically at bedtime. Apply over nail and surrounding skin. Apply daily over previous coat. After seven (7) days, may remove with alcohol and continue cycle. Patient not taking: Reported on 08/09/2020 06/01/20   Edwin CapMcDonald, Adam R, DPM  clopidogrel (PLAVIX) 75 MG tablet Take 1 tablet (75 mg total) by mouth daily. 12/26/19   Ngetich, Dinah C, NP  glipiZIDE (GLUCOTROL) 10 MG tablet Take 1 tablet (10 mg total) by mouth 2 (two) times daily with a meal. Needs an appointment before anymore future refills. 08/27/20   Ngetich, Dinah C, NP  indapamide (LOZOL) 1.25 MG tablet Take 1 tablet (1.25 mg total) by mouth daily. Needs an appointment before anymore future refill. 08/27/20   Ngetich, Dinah C, NP  insulin detemir (LEVEMIR) 100 UNIT/ML injection Inject 0.05 mLs (5 Units total) into the skin daily. If CBG >125 (per daughter) 04/27/20   Lewie ChamberGirguis, David, MD  ketoconazole (NIZORAL) 2 % cream Apply 1 application topically daily. 06/01/20   McDonald, Rachelle HoraAdam R, DPM  lamoTRIgine (LAMICTAL) 150 MG tablet TAKE 1 TABLET BY MOUTH TWICE A DAY 06/01/20   Ngetich, Dinah C, NP  linagliptin (TRADJENTA) 5 MG TABS tablet Take 1 tablet (5 mg total) by mouth daily. Patient not taking: Reported on 08/09/2020 04/28/20   Lewie ChamberGirguis, David, MD  liver oil-zinc oxide (DESITIN) 40 % ointment Apply 1 application topically 2 (two) times daily as needed for irritation. Patient not taking: Reported on 08/09/2020 12/26/19   Ngetich, Dinah C, NP  midodrine (PROAMATINE) 5 MG tablet  Take one tablet by mouth in the morning, at noon, and at bedtime for orthostatic Hypotension. Needs an appointment before anymore future refills. 08/27/20   Ngetich, Dinah C, NP  NOVOLOG FLEXPEN 100 UNIT/ML FlexPen INJECT 2-15 UNITS INTO THE SKIN 4 TIMES DAILY - BEFORE MEALS AND AT BEDTIME. SSI: 121-150 = 2 UNITS 151-200 = 3 UNITS, 201-250 = 5 UNITS, 251-300 = 8 UNITS, 301-350 = 11 UNITS, 351-300 = 15 UNITS, GREATER THAN 400 = 15 UNITS AND CALL MD Patient taking differently: Inject 2-15 Units into the skin in the morning, at noon, in the evening, and at bedtime. 08/03/20   Ngetich, Dinah C, NP  senna-docusate (SENOKOT-S) 8.6-50 MG tablet Take 1 tablet by mouth at bedtime as needed for mild constipation. Patient not taking: Reported on 08/09/2020 04/27/20   Girguis,  Onalee Hua, MD  tamsulosin (FLOMAX) 0.4 MG CAPS capsule Take 1 capsule (0.4 mg total) by mouth daily after supper. Patient not taking: Reported on 08/09/2020 12/27/19   Ngetich, Dinah C, NP  traZODone (DESYREL) 50 MG tablet TAKE 1/2-1 TABLET BY MOUTH AT BEDTIME AS NEEDED FOR SLEEP. Patient taking differently: Take by mouth at bedtime. 03/30/20   Ngetich, Dinah C, NP  Vitamin D3 (VITAMIN D) 25 MCG tablet Take 0.5 tablets (500 Units total) by mouth daily. 08/30/19   Margit Hanks, MD    Allergies    Ciprofloxacin, Keppra [levetiracetam], Avocado, and Metformin and related  Review of Systems   Review of Systems  Unable to perform ROS: Patient nonverbal    Physical Exam Updated Vital Signs BP 137/70   Pulse 91   Temp 98.7 F (37.1 C) (Axillary)   Resp (!) 22   Ht 6\' 1"  (1.854 m)   Wt 72 kg   SpO2 100%   BMI 20.94 kg/m   Physical Exam Vitals and nursing note reviewed.  Constitutional:      Appearance: Normal appearance. He is normal weight.     Comments: Awake, follows simple commands   Cardiovascular:     Rate and Rhythm: Normal rate and regular rhythm.     Pulses: Normal pulses.     Heart sounds: Normal heart sounds.   Pulmonary:     Effort: Pulmonary effort is normal.     Breath sounds: Normal breath sounds.  Abdominal:     Palpations: Abdomen is soft.     Tenderness: There is no abdominal tenderness.  Musculoskeletal:        General: No swelling or tenderness. Normal range of motion.  Skin:    General: Skin is warm and dry.     Findings: No erythema or rash.  Neurological:     Comments: Moves extremities      ED Results / Procedures / Treatments   Labs (all labs ordered are listed, but only abnormal results are displayed) Labs Reviewed  URINALYSIS, ROUTINE W REFLEX MICROSCOPIC - Abnormal; Notable for the following components:      Result Value   APPearance HAZY (*)    Hgb urine dipstick LARGE (*)    Leukocytes,Ua LARGE (*)    RBC / HPF >50 (*)    Bacteria, UA RARE (*)    All other components within normal limits  BASIC METABOLIC PANEL - Abnormal; Notable for the following components:   Glucose, Bld 191 (*)    BUN 37 (*)    Creatinine, Ser 1.84 (*)    GFR, Estimated 39 (*)    All other components within normal limits  CULTURE, BLOOD (ROUTINE X 2)  CULTURE, BLOOD (ROUTINE X 2)  URINE CULTURE  CBC WITH DIFFERENTIAL/PLATELET    EKG None  Radiology No results found.  Procedures Procedures   Medications Ordered in ED Medications  sodium chloride 0.9 % bolus 1,000 mL (has no administration in time range)  cefTRIAXone (ROCEPHIN) 1 g in sodium chloride 0.9 % 100 mL IVPB (has no administration in time range)    ED Course  I have reviewed the triage vital signs and the nursing notes.  Pertinent labs & imaging results that were available during my care of the patient were reviewed by me and considered in my medical decision making (see chart for details).  Clinical Course as of 09/06/20 1548  Sun Sep 06, 2020  [id number] 70 year old male brought in by EMS from home with concern for change  in mental status. On arrival, follows simple commands, nonverbal and bedridden at  baseline. Patient was seen in the ED 10 days ago with similar complaint, urine culture grew greater than 100,000 colonies of Proteus which was not treated by review of records.  Consider this may be part of his presentation today. Urinalysis with large hemoglobin, large leukocytes, rare bacteria. CBC is unremarkable with normal white blood cell count. BMP with concern for AKI with increasing creatinine to 1.84 time today with GFR of 39. Will plan to order IV fluids. Patient's daughter now at bedside stating that he normally is able to eat and feed himself however could not do that today and has repeated left hand shaking although appears alert while this is happening.  States that he is on medication for seizures, does have history of stroke.  CT head added to work-up. [LM]  1541 Care signed out to Dr. Pecola Leisure at change of shift pending CT head. [LM]    Clinical Course User Index [LM] Alden Hipp   MDM Rules/Calculators/A&P                          Final Clinical Impression(s) / ED Diagnoses Final diagnoses:  Altered mental status, unspecified altered mental status type  AKI (acute kidney injury) (HCC)  Urinary tract infection with hematuria, site unspecified    Rx / DC Orders ED Discharge Orders    None       Jeannie Fend, PA-C 09/06/20 1544    Jeannie Fend, PA-C 09/06/20 1548    Tilden Fossa, MD 09/07/20 [phone]

## 2020-09-06 NOTE — ED Triage Notes (Signed)
Pt BIB [facility] from home, c/o AMS from baseline. Per pts daughter, who is pts caregiver, pt is more altered than usual. At baseline, pt is able to comprehend, and knows who his daughter is, but is unable to do so today. At baseline, pt is nonverbal and nonambulatory due to hx of stroke. Per daughter, pt was mouthing "help me" at home.

## 2020-09-06 NOTE — H&P (Signed)
History and Physical    [name]. ZJI:[id number] DOB: May 17, 1950 DOA: 09/06/2020  PCP: Caesar Bookman, NP (Confirm with patient/family/NH records and if not entered, this has to be entered at [facility] point of entry) Patient coming from: Home  I have personally briefly reviewed patient's old medical records in [facility]  Chief Complaint: No pain  HPI: Douglas [name]. is a 70 y.o. male with medical history significant of multiple CVAs with residual right-sided weakness, and baseline nonverbal and nonambulatory, IDDM, HTN, HLD, seizure disorder, CKD stage II, presented with altered mentations.  Patient unable to provide any history, most history provided by patient daughter over the phone.  Daughter reported for the last 3 days, patient's behavior became bizarre and frequent episode of confusion.  Decreased oral intake since yesterday.  But did not complain any urinary problems and appears to have no fever or chills.  2 weeks ago patient had similar ER visit with similar presentation, patient was given hydration and reassurance and sent home.  UA on that ER visit, culture growing Proteus. ED Course: UA compatible with UTI, blood work showed AKI and hemoconcentration  Review of Systems: Unable to perform, patient confused.  Past Medical History:  Diagnosis Date  . Acute blood loss anemia   . Acute embolic stroke (HCC)   . Acute lower UTI   . AMS (altered mental status) 06/05/2019  . Cerebral edema (HCC) 04/04/2018  . Diabetes 1.5, managed as type 2 (HCC)   . Diabetes mellitus without complication (HCC)   . High cholesterol   . High glucose    Diabetes  . History of CVA with residual deficit   . History of Dysphagia, post-stroke   . Hypertension   . Ischemic stroke (HCC) 03/05/2019  . Labile blood glucose   . 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 Renal insufficiency 04/04/2018  . Seizures (HCC)   . Stroke (HCC)   . UTI (urinary tract infection)  03/05/2019    Past Surgical History:  Procedure Laterality Date  . IR PATIENT EVAL TECH 0-60 MINS  03/30/2018  . LOOP RECORDER INSERTION N/A 03/08/2019   Procedure: LOOP RECORDER INSERTION;  Surgeon: Regan Lemming, MD;  Location: [facility] INVASIVE CV LAB;  Service: Cardiovascular;  Laterality: N/A;  . TEE WITHOUT CARDIOVERSION N/A 04/02/2018   Procedure: TRANSESOPHAGEAL ECHOCARDIOGRAM (TEE);  Surgeon: [name], MD;  Location: [facility] ENDOSCOPY;  Service: Cardiovascular;  Laterality: N/A;     reports that he has never smoked. He has never used smokeless tobacco. He reports previous alcohol use. He reports that he does not use drugs.  Allergies  Allergen Reactions  . Ciprofloxacin Anaphylaxis  . Keppra [Levetiracetam] Other (See Comments)    Makes the patient very agitated and "not like himself"  . Avocado Nausea And Vomiting  . Metformin And Related Other (See Comments)    "Renal issue"    Family History  Problem Relation Age of Onset  . Leukemia Mother   . Dementia Father      Prior to Admission medications   Medication Sig Start Date End Date Taking? Authorizing Provider  amLODipine (NORVASC) 10 MG tablet Take 1 tablet (10 mg total) by mouth daily. 03/06/20   Ngetich, Dinah C, NP  ascorbic acid (VITAMIN C) 500 MG tablet Take 1 tablet (500 mg total) by mouth daily as needed (if cold-like symptoms are present). 08/30/19   Margit Hanks, MD  aspirin EC 325 MG tablet Take 325 mg by  mouth every morning.    [provider]  atorvastatin (LIPITOR) 40 MG tablet Take 1 tablet (40 mg total) by mouth daily at 6 (six) AM. 12/26/19   Ngetich, Dinah C, NP  baclofen (LIORESAL) 20 MG tablet TAKE 1 TABLET BY MOUTH TWICE A DAY Patient taking differently: Take 20 mg by mouth 2 (two) times daily. 06/01/20   Ngetich, Dinah C, NP  carvedilol (COREG) 12.5 MG tablet Take 1 tablet (12.5 mg total) by mouth 2 (two) times daily with a meal. WILL NEED APPT FOR FUTURE REFILLS 08/28/20   Sharon SellerEubanks,  Jessica K, NP  ciclopirox (PENLAC) 8 % solution Apply topically at bedtime. Apply over nail and surrounding skin. Apply daily over previous coat. After seven (7) days, may remove with alcohol and continue cycle. Patient not taking: Reported on 08/09/2020 06/01/20   Edwin CapMcDonald, Adam R, DPM  clopidogrel (PLAVIX) 75 MG tablet Take 1 tablet (75 mg total) by mouth daily. 12/26/19   Ngetich, Dinah C, NP  glipiZIDE (GLUCOTROL) 10 MG tablet Take 1 tablet (10 mg total) by mouth 2 (two) times daily with a meal. Needs an appointment before anymore future refills. 08/27/20   Ngetich, Dinah C, NP  indapamide (LOZOL) 1.25 MG tablet Take 1 tablet (1.25 mg total) by mouth daily. Needs an appointment before anymore future refill. 08/27/20   Ngetich, Dinah C, NP  insulin detemir (LEVEMIR) 100 UNIT/ML injection Inject 0.05 mLs (5 Units total) into the skin daily. If CBG >125 (per daughter) 04/27/20   Lewie ChamberGirguis, David, MD  ketoconazole (NIZORAL) 2 % cream Apply 1 application topically daily. 06/01/20   McDonald, Rachelle HoraAdam R, DPM  lamoTRIgine (LAMICTAL) 150 MG tablet TAKE 1 TABLET BY MOUTH TWICE A DAY 06/01/20   Ngetich, Dinah C, NP  linagliptin (TRADJENTA) 5 MG TABS tablet Take 1 tablet (5 mg total) by mouth daily. Patient not taking: Reported on 08/09/2020 04/28/20   Lewie ChamberGirguis, David, MD  liver oil-zinc oxide (DESITIN) 40 % ointment Apply 1 application topically 2 (two) times daily as needed for irritation. Patient not taking: Reported on 08/09/2020 12/26/19   Ngetich, Dinah C, NP  midodrine (PROAMATINE) 5 MG tablet Take one tablet by mouth in the morning, at noon, and at bedtime for orthostatic Hypotension. Needs an appointment before anymore future refills. 08/27/20   Ngetich, Dinah C, NP  NOVOLOG FLEXPEN 100 UNIT/ML FlexPen INJECT 2-15 UNITS INTO THE SKIN 4 TIMES DAILY - BEFORE MEALS AND AT BEDTIME. SSI: 121-150 = 2 UNITS 151-200 = 3 UNITS, 201-250 = 5 UNITS, 251-300 = 8 UNITS, 301-350 = 11 UNITS, 351-300 = 15 UNITS, GREATER THAN 400 = 15  UNITS AND CALL MD Patient taking differently: Inject 2-15 Units into the skin in the morning, at noon, in the evening, and at bedtime. 08/03/20   Ngetich, Dinah C, NP  senna-docusate (SENOKOT-S) 8.6-50 MG tablet Take 1 tablet by mouth at bedtime as needed for mild constipation. Patient not taking: Reported on 08/09/2020 04/27/20   Lewie ChamberGirguis, David, MD  tamsulosin (FLOMAX) 0.4 MG CAPS capsule Take 1 capsule (0.4 mg total) by mouth daily after supper. Patient not taking: Reported on 08/09/2020 12/27/19   Ngetich, Dinah C, NP  traZODone (DESYREL) 50 MG tablet TAKE 1/2-1 TABLET BY MOUTH AT BEDTIME AS NEEDED FOR SLEEP. Patient taking differently: Take by mouth at bedtime. 03/30/20   Ngetich, Dinah C, NP  Vitamin D3 (VITAMIN D) 25 MCG tablet Take 0.5 tablets (500 Units total) by mouth daily. 08/30/19   Margit HanksAlexander, Anne D, MD  Physical Exam: Vitals:   09/06/20 1730 09/06/20 1745 09/06/20 1800 09/06/20 1815  BP: 123/64 118/71 122/63 119/73  Pulse: 78 74 73 76  Resp: 20 13 16 13   Temp:      TempSrc:      SpO2: 100% 100% 100% 100%  Weight:      Height:        Constitutional: NAD, calm, comfortable Vitals:   09/06/20 1730 09/06/20 1745 09/06/20 1800 09/06/20 1815  BP: 123/64 118/71 122/63 119/73  Pulse: 78 74 73 76  Resp: 20 13 16 13   Temp:      TempSrc:      SpO2: 100% 100% 100% 100%  Weight:      Height:       Eyes: PERRL, lids and conjunctivae normal ENMT: Mucous membranes are dry. Posterior pharynx clear of any exudate or lesions.Normal dentition.  Neck: normal, supple, no masses, no thyromegaly Respiratory: clear to auscultation bilaterally, no wheezing, no crackles. Normal respiratory effort. No accessory muscle use.  Cardiovascular: Regular rate and rhythm, no murmurs / rubs / gallops. No extremity edema. 2+ pedal pulses. No carotid bruits.  Abdomen: Mild tenderness on suprapubic area, no masses palpated. No hepatosplenomegaly. Bowel sounds positive.  Musculoskeletal: no clubbing /  cyanosis. No joint deformity upper and lower extremities. Good ROM, no contractures. Normal muscle tone.  Skin: no rashes, lesions, ulcers. No induration Neurologic: No facial droops, moving left-sided limbs but not right psychiatric: Confused    Labs on Admission: I have personally reviewed following labs and imaging studies  CBC: Recent Labs  Lab 09/06/20 1434  WBC 5.7  NEUTROABS 3.6  HGB 14.7  HCT 46.4  MCV 93.7  PLT 212   Basic Metabolic Panel: Recent Labs  Lab 09/06/20 1434  NA 139  K 4.1  CL 104  CO2 22  GLUCOSE 191*  BUN 37*  CREATININE 1.84*  CALCIUM 9.6   GFR: Estimated Creatinine Clearance: 38 mL/min (A) (by C-G formula based on SCr of 1.84 mg/dL (H)). Liver Function Tests: No results for input(s): AST, ALT, ALKPHOS, BILITOT, PROT, ALBUMIN in the last 168 hours. No results for input(s): LIPASE, AMYLASE in the last 168 hours. No results for input(s): AMMONIA in the last 168 hours. Coagulation Profile: No results for input(s): INR, PROTIME in the last 168 hours. Cardiac Enzymes: No results for input(s): CKTOTAL, CKMB, CKMBINDEX, TROPONINI in the last 168 hours. BNP (last 3 results) No results for input(s): PROBNP in the last 8760 hours. HbA1C: No results for input(s): HGBA1C in the last 72 hours. CBG: No results for input(s): GLUCAP in the last 168 hours. Lipid Profile: No results for input(s): CHOL, HDL, LDLCALC, TRIG, CHOLHDL, LDLDIRECT in the last 72 hours. Thyroid Function Tests: No results for input(s): TSH, T4TOTAL, FREET4, T3FREE, THYROIDAB in the last 72 hours. Anemia Panel: No results for input(s): VITAMINB12, FOLATE, FERRITIN, TIBC, IRON, RETICCTPCT in the last 72 hours. Urine analysis:    Component Value Date/Time   COLORURINE YELLOW 09/06/2020 1452   APPEARANCEUR HAZY (A) 09/06/2020 1452   LABSPEC 1.017 09/06/2020 1452   PHURINE 5.0 09/06/2020 1452   GLUCOSEU NEGATIVE 09/06/2020 1452   HGBUR LARGE (A) 09/06/2020 1452   BILIRUBINUR  NEGATIVE 09/06/2020 1452   KETONESUR NEGATIVE 09/06/2020 1452   PROTEINUR NEGATIVE 09/06/2020 1452   NITRITE NEGATIVE 09/06/2020 1452   LEUKOCYTESUR LARGE (A) 09/06/2020 1452    Radiological Exams on Admission: CT Head Wo Contrast  Result Date: 09/06/2020 CLINICAL DATA:  Altered mental status. EXAM: CT  HEAD WITHOUT CONTRAST TECHNIQUE: Contiguous axial images were obtained from the base of the skull through the vertex without intravenous contrast. COMPARISON:  August 09, 2020 FINDINGS: Brain: There is mild cerebral atrophy with widening of the extra-axial spaces and ventricular dilatation. There are areas of decreased attenuation within the white matter tracts of the supratentorial brain, consistent with microvascular disease changes. A large area of cortical encephalomalacia and adjacent chronic white matter low attenuation is seen throughout the left hemisphere, involving the left MCA and left PCA territories. Associated ex vacuo dilatation of the left lateral ventricle is seen. Vascular: No hyperdense vessel or unexpected calcification. Skull: Normal. Negative for fracture or focal lesion. Sinuses/Orbits: No acute finding. Other: None. IMPRESSION: 1. Generalized cerebral atrophy. 2. Large chronic left MCA and left PCA territory infarct. 3. No acute intracranial abnormality. Electronically Signed   By: Aram Candela M.D.   On: 09/06/2020 16:56   DG Chest Port 1 View  Result Date: 09/06/2020 CLINICAL DATA:  Altered mental status EXAM: PORTABLE CHEST 1 VIEW COMPARISON:  08/09/2020 FINDINGS: Cardiac shadows within normal limits. Loop recorder is again noted. The lungs are clear. Previously seen nodular density in the right base is well seen but extrinsic to the chest wall consistent with prior nipple shadow. No focal infiltrate is seen. No bony noted. IMPRESSION: No acute abnormality seen. Previously noted nodule corresponds to a nipple shadow extrinsic to the chest wall. Electronically Signed   By:  Alcide Clever M.D.   On: 09/06/2020 17:32    EKG: Ordered  Assessment/Plan Active Problems:   * No active hospital problems. *  (please populate well all problems here in Problem List. (For example, if patient is on BP meds at home and you resume or decide to hold them, it is a problem that needs to be her. Same for CAD, COPD, HLD and so on)  Acute metabolic encephalopathy -Likely secondary to UTI.  UTI -Treat with cefadroxil according to recent recent culture showing Proteus -Check PVR.  AKI on CKD stage II -Signs of hypovolemic/dehydration -Start maintenance IV fluid -Consider renal imaging if kidney function not improving.  HTN -BP borderline -Hold long acting Amlodipine -Cut down Coreg for now  Orthostatic hypotension -Continue Midodrine  Hx of BPH -Check PVR and consider re-start Flomax  Right-sided paresis secondary to multiple CVA -CT head reassuring, PT tomorrow.  Baseline able to transfer himself to commode with help.    DVT prophylaxis: Heparin subQ Code Status: Full Code Family Communication: Daughter by phone Disposition Plan: Expect more than 2 midnight hospital stay to treat UTI and AKI. Consults called: None Admission status: Tele admit   Emeline General MD [facility] Pager [phone]  09/06/2020, 6:46 PM

## 2020-09-06 NOTE — ED Notes (Signed)
Attempted to call report x2

## 2020-09-06 NOTE — Progress Notes (Signed)
Pt daughter states that she gives BP meds diferently. Coreg once a day, no lisinopryl.

## 2020-09-07 ENCOUNTER — Ambulatory Visit (INDEPENDENT_AMBULATORY_CARE_PROVIDER_SITE_OTHER): Payer: Medicare Other

## 2020-09-07 DIAGNOSIS — I639 Cerebral infarction, unspecified: Secondary | ICD-10-CM | POA: Diagnosis not present

## 2020-09-07 DIAGNOSIS — N179 Acute kidney failure, unspecified: Secondary | ICD-10-CM | POA: Diagnosis not present

## 2020-09-07 DIAGNOSIS — N3000 Acute cystitis without hematuria: Secondary | ICD-10-CM | POA: Diagnosis not present

## 2020-09-07 LAB — CBC
HCT: 39 % (ref 39.0–52.0)
Hemoglobin: 12.6 g/dL — ABNORMAL LOW (ref 13.0–17.0)
MCH: 29.9 pg (ref 26.0–34.0)
MCHC: 32.3 g/dL (ref 30.0–36.0)
MCV: 92.6 fL (ref 80.0–100.0)
Platelets: 212 10*3/uL (ref 150–400)
RBC: 4.21 MIL/uL — ABNORMAL LOW (ref 4.22–5.81)
RDW: 14 % (ref 11.5–15.5)
WBC: 6.2 10*3/uL (ref 4.0–10.5)
nRBC: 0 % (ref 0.0–0.2)

## 2020-09-07 LAB — BASIC METABOLIC PANEL
Anion gap: 7 (ref 5–15)
BUN: 27 mg/dL — ABNORMAL HIGH (ref 8–23)
CO2: 30 mmol/L (ref 22–32)
Calcium: 9.6 mg/dL (ref 8.9–10.3)
Chloride: 104 mmol/L (ref 98–111)
Creatinine, Ser: 1.55 mg/dL — ABNORMAL HIGH (ref 0.61–1.24)
GFR, Estimated: 48 mL/min — ABNORMAL LOW (ref >60)
Glucose, Bld: 157 mg/dL — ABNORMAL HIGH (ref 70–99)
Potassium: 3.9 mmol/L (ref 3.5–5.1)
Sodium: 141 mmol/L (ref 135–145)

## 2020-09-07 LAB — HEMOGLOBIN A1C
Hgb A1c MFr Bld: 6.8 % — ABNORMAL HIGH (ref 4.8–5.6)
Mean Plasma Glucose: 148.46 mg/dL

## 2020-09-07 LAB — GLUCOSE, CAPILLARY
Glucose-Capillary: 115 mg/dL — ABNORMAL HIGH (ref 70–99)
Glucose-Capillary: 162 mg/dL — ABNORMAL HIGH (ref 70–99)
Glucose-Capillary: 100 mg/dL — ABNORMAL HIGH (ref 70–99)
Glucose-Capillary: 140 mg/dL — ABNORMAL HIGH (ref 70–99)

## 2020-09-07 MED ORDER — SODIUM CHLORIDE 0.9 % IV SOLN: INTRAVENOUS | Status: AC

## 2020-09-07 NOTE — Plan of Care (Signed)

## 2020-09-07 NOTE — Progress Notes (Signed)
Initial Nutrition Assessment  DOCUMENTATION CODES:   Severe malnutrition in context of chronic illness  INTERVENTION:  Will monitor for results of swallow evaluation and provide supplementation recommendations/orders as appropriate.  NUTRITION DIAGNOSIS:   Severe Malnutrition related to chronic illness (multiple CVAs) as evidenced by severe muscle depletion,severe fat depletion.  GOAL:   Patient will meet greater than or equal to 90% of their needs  MONITOR:   PO intake,Supplement acceptance,Weight trends,Labs,I & O's  REASON FOR ASSESSMENT:   Malnutrition Screening Tool    ASSESSMENT:   Pt admitted with acute metabolic encephalopathy 2/2 acute lower UTI and AKI. PMH includes multiple CVAs with residual R-sided hemiplegia with contractures, type 2 DM, HTN, HLD, seizure disorder, stage II CKD and UTI. Pt is non-verbal and nonambulatory at baseline.  Pt unable to provide any history. No family at bedside.  Per H&P, pt's daughter reported pt had been experiencing confusion x3 days PTA with decreased oral intake since the day before admission.  Discussed pt with MD to request diet liberalization to optimize po intake; however, MD has requested swallow evaluation first. RD will monitor for results of swallow evaluation and will provide supplementation recommendations as appropriate. MD approves diet liberalization pending results of swallow eval.  Reviewed weight history. Pt weighed 77.6kg in Nov 2021 and now weighs 72 kg. This indicates a 7% weight loss x6 months, which is insignificant for time frame.   No PO intake documented.   No UOP documented.   Medications: SSI TID w/ meals  Labs: Cr 1.55 (H, trending down) CBGs 115-162-100  NUTRITION - FOCUSED PHYSICAL EXAM:  Flowsheet Row Most Recent Value  Orbital Region No depletion  Upper Arm Region Moderate depletion  Thoracic and Lumbar Region Mild depletion  Buccal Region Mild depletion  Temple Region Severe depletion   Clavicle Bone Region Severe depletion  Clavicle and Acromion Bone Region Severe depletion  Scapular Bone Region Severe depletion  Dorsal Hand Severe depletion  Patellar Region Severe depletion  Anterior Thigh Region Severe depletion  Posterior Calf Region Severe depletion  Edema (RD Assessment) None  Hair Reviewed  Eyes Reviewed  Mouth Reviewed  Skin Reviewed  Nails Reviewed       Diet Order:   Diet Order            Diet heart healthy/carb modified Room service appropriate? Yes; Fluid consistency: Thin  Diet effective now                 EDUCATION NEEDS:   No education needs have been identified at this time  Skin:  Skin Assessment: Reviewed RN Assessment  Last BM:  PTA  Height:   Ht Readings from Last 1 Encounters:  09/06/20 6\' 1"  (1.854 m)    Weight:   Wt Readings from Last 1 Encounters:  09/06/20 72 kg    BMI:  Body mass index is 20.94 kg/m.  Estimated Nutritional Needs:   Kcal:  2000-2200  Protein:  100-115 grams  Fluid:  >2L/d    11/06/20, MS, RD, LDN RD pager number and weekend/on-call pager number located in Amion.

## 2020-09-07 NOTE — Progress Notes (Signed)
PROGRESS NOTE   Douglas [name].  MGQ:[id number]    DOB: 10/06/1950    DOA: 09/06/2020  PCP: Caesar Bookman, NP   I have briefly reviewed patients previous medical records in [facility]  Chief Complaint  Patient presents with  . 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  Admitted for acute metabolic encephalopathy secondary to acute lower UTI and acute kidney injury.   Assessment & Plan:  Active Problems:   UTI (urinary tract infection)   Encephalopathy   Acute metabolic encephalopathy complicating chronic stroke related encephalopathy Likely related to acute lower UTI and AKI.  Alert, apart from his name, attempts to say something but speech undecipherable.  Does not follow instructions.  Await family visit to determine whether his mental status has improved or not.  CT head without acute findings.  Proteus mirabilis acute cystitis without hematuria: Urine culture: >100 K colonies of Proteus mirabilis, susceptibilities pending but same organism identified on urine culture 4/28 when it was pansensitive except to nitrofurantoin.  Continue empirically started IV ceftriaxone.  Acute kidney injury complicating CKD stage II Creatinine 0.88 on 4/10.  Presented with creatinine of 1.84.  AKI likely related to poor oral intake.  Improving with IVF, continue.  Follow BMP in a.m.  Essential hypertension Blood pressure is borderline on admission and hence amlodipine on hold.  Continue reduced dose carvedilol.  Blood pressures improved and controlled now.  Orthostatic hypotension Remains on midodrine.  History of  BPH Consider restarting Flomax after PVR.  History of multiple strokes with residual right hemiplegia with contractures Continue aspirin, Plavix and statins.  Hyperlipidemia Continue statins.  Seizure disorder Continue Lamictal.  Type II DM with hyperglycemia Hold Glucotrol, Levemir and Tradjenta.  Reasonable inpatient control on SSI.  Body mass index is 20.94 kg/m.    DVT prophylaxis: heparin injection 5,000 Units Start: 09/06/20 2000     Code Status: Full Code Family Communication: None at bedside. Disposition:  Status is: Inpatient  Remains inpatient appropriate because:Inpatient level of care appropriate due to severity of illness   Dispo: The patient is from: Home              Anticipated d/c is to: TBD              Patient currently is not medically stable to d/c.   Difficult to place patient No        Consultants:   None  Procedures:   None  Antimicrobials:    Anti-infectives (From admission, onward)   Start     Dose/Rate Route Frequency Ordered Stop   09/07/20 1000  cefTRIAXone (ROCEPHIN) 1 g in sodium chloride 0.9 % 100 mL IVPB        1 g 200 mL/hr over 30 Minutes Intravenous Every 24 hours 09/06/20 1857     09/06/20 1545  cefTRIAXone (ROCEPHIN) 1 g in sodium chloride 0.9 % 100 mL IVPB        1 g 200 mL/hr over 30 Minutes Intravenous  Once 09/06/20 1543 09/06/20 1701        Subjective:  Says his name but unable to understand rest of his speech.  Does not follow instructions.  We will have to wait for his family to advise whether there is any change in his mental status since admission.  As per nursing, no acute issues noted  Objective:   Vitals:   09/06/20 2115 09/06/20 2145 09/06/20 2241 09/07/20 1237  BP: 129/73 130/78 (!) 152/83 137/79  Pulse: 84 82  64  Resp: 13   20  Temp:    98.2 F (36.8 C)  TempSrc:   Oral   SpO2: 100% 100%  100%  Weight:      Height:        General exam: Elderly male, moderately built and frail,  chronically ill looking, lying comfortably propped up in bed without distress. Respiratory system: Clear to auscultation/poor inspiratory effort. Respiratory effort normal. Cardiovascular system: S1 & S2 heard, RRR. No JVD, murmurs, rubs, gallops or clicks. No pedal edema.  Telemetry personally reviewed: Sinus rhythm. Gastrointestinal system: Abdomen is nondistended, soft and nontender. No organomegaly or masses felt. Normal bowel sounds heard. Central nervous system: Alert and oriented only to self.  Dysarthria, facial asymmetry present, all likely chronic Extremities: Right upper extremity contracture.  Right lower extremity 0/5 power.  Left upper extremity grade 5 x 5 power.  Left lower extremity at least grade 2 x 5 power. Skin: No rashes, lesions or ulcers Psychiatry: Judgement and insight impaired. Mood & affect cannot be assessed.     Data Reviewed:   I have personally reviewed following labs and imaging studies   CBC: Recent Labs  Lab 09/06/20 1434 09/07/20 0430  WBC 5.7 6.2  NEUTROABS 3.6  --   HGB 14.7 12.6*  HCT 46.4 39.0  MCV 93.7 92.6  PLT 212 212    Basic Metabolic Panel: Recent Labs  Lab 09/06/20 1434 09/07/20 0430  NA 139 141  K 4.1 3.9  CL 104 104  CO2 22 30  GLUCOSE 191* 157*  BUN 37* 27*  CREATININE 1.84* 1.55*  CALCIUM 9.6 9.6    Liver Function Tests: No results for input(s): AST, ALT, ALKPHOS, BILITOT, PROT, ALBUMIN in the last 168 hours.  CBG: Recent Labs  Lab 09/06/20 2301 09/07/20 0727 09/07/20 1236  GLUCAP 110* 115* 162*    Microbiology Studies:   Recent Results (from the past 240 hour(s))  Urine culture     Status: Abnormal (Preliminary result)   Collection Time: 09/06/20  3:45 PM   Specimen: Urine, Random  Result Value Ref Range Status   Specimen Description URINE, RANDOM  Final   Special Requests NONE  Final   Culture (A)  Final    >=100,000 COLONIES/mL PROTEUS MIRABILIS SUSCEPTIBILITIES TO FOLLOW Performed at [facility], [address] [place], [place] [address]    Report Status PENDING  Incomplete  Culture, blood (routine x 2)     Status: None (Preliminary result)   Collection Time: 09/06/20  4:04 PM   Specimen: BLOOD LEFT FOREARM  Result Value Ref Range Status   Specimen Description BLOOD LEFT FOREARM  Final   Special Requests   Final    BOTTLES DRAWN AEROBIC AND ANAEROBIC Blood Culture results may not be optimal due to an inadequate volume of blood received in culture bottles   Culture   Final    NO GROWTH < 24 HOURS Performed at [facility], [address], [place], [place] [address]    Report Status PENDING  Incomplete  Culture, blood (routine x 2)     Status: None (Preliminary result)   Collection Time: 09/06/20  4:11 PM   Specimen: BLOOD RIGHT FOREARM  Result Value Ref Range Status   Specimen Description BLOOD RIGHT FOREARM  Final   Special Requests   Final    BOTTLES DRAWN AEROBIC ONLY Blood Culture adequate volume   Culture   Final    NO GROWTH < 24 HOURS Performed at [facility], [address] [place], [place] [address]    Report Status PENDING  Incomplete     Radiology Studies:  CT Head Wo Contrast  Result Date: 09/06/2020 CLINICAL DATA:  Altered mental status. EXAM: CT HEAD WITHOUT CONTRAST TECHNIQUE: Contiguous axial images were obtained from the base of the skull through the vertex without intravenous contrast. COMPARISON:  August 09, 2020 FINDINGS: Brain: There is mild cerebral atrophy with widening of the extra-axial spaces and ventricular dilatation. There are areas of decreased attenuation within the white matter tracts of the supratentorial brain, consistent with microvascular disease changes. A large area of cortical encephalomalacia and adjacent chronic white matter low attenuation is seen throughout the left hemisphere, involving the left MCA and left PCA territories. Associated ex vacuo dilatation of the left lateral ventricle is seen. Vascular: No  hyperdense vessel or unexpected calcification. Skull: Normal. Negative for fracture or focal lesion. Sinuses/Orbits: No acute finding. Other: None. IMPRESSION: 1. Generalized cerebral atrophy. 2. Large chronic left MCA and left PCA territory infarct. 3. No acute intracranial abnormality. Electronically Signed   By: Aram Candela M.D.   On: 09/06/2020 16:56   DG Chest Port 1 View  Result Date: 09/06/2020 CLINICAL DATA:  Altered mental status EXAM: PORTABLE CHEST 1 VIEW COMPARISON:  08/09/2020 FINDINGS: Cardiac shadows within normal limits. Loop recorder is again noted. The lungs are clear. Previously seen nodular density in the right base is well seen but extrinsic to the chest wall consistent with prior nipple shadow. No focal infiltrate is seen. No bony noted. IMPRESSION: No acute abnormality seen. Previously noted nodule corresponds to a nipple shadow extrinsic to the chest wall. Electronically Signed   By: Alcide Clever M.D.   On: 09/06/2020 17:32     Scheduled Meds:   . aspirin EC  325 mg Oral q morning  . atorvastatin  40 mg Oral Q0600  . baclofen  20 mg Oral BID  . carvedilol  6.25 mg Oral BID WC  . clopidogrel  75 mg Oral Daily  . heparin  5,000 Units Subcutaneous Q12H  . insulin aspart  0-9 Units Subcutaneous TID WC  . lamoTRIgine  150 mg Oral BID  . midodrine  2.5 mg Oral BID WC  . traZODone  50 mg Oral QHS    Continuous Infusions:   . sodium chloride 125 mL/hr at 09/07/20 1258  . cefTRIAXone (ROCEPHIN)  IV Stopped (09/07/20 0856)     LOS: 1 day     Marcellus Scott, MD, [place], [facility]. [facility]    To contact the attending provider between 7A-7P or the covering provider during after hours 7P-7A, please log into the web site [email or web address] and access using universal [place] password for that web site. If you do not have the password, please call the hospital operator.  09/07/2020, 3:22 PM

## 2020-09-07 NOTE — Evaluation (Addendum)
Physical Therapy Evaluation Patient Details Name: Douglas [name]. MRN: [id number] DOB: Mar 11, 1951 Today's Date: 09/07/2020   History of Present Illness  Pt adm 5/8 with AMS. Pt found to have UTI. 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  Clinical Impression  Pt presents to PT with significant impairments with mobility which I suspect may be close to his baseline. Pt is non ambulatory and likely requires significant assist at baseline with transfers. Will work with pt acutely to maximize his mobility to decr caregiver burden. Recommend transport home via [facility]. At times pt became agitated/frustrated.     Follow Up Recommendations Supervision/Assistance - 24 hour;Home health PT (HHPT if pt below his baseline)    Equipment Recommendations  None recommended by PT    Recommendations for Other Services       Precautions / Restrictions Precautions Precautions: Fall      Mobility  Bed Mobility Overal bed mobility: Needs Assistance Bed Mobility: Supine to Sit;Sit to Supine     Supine to sit: Max assist Sit to supine: Max assist   General bed mobility comments: Assist to bring legs off of the bed, elevate trunk into sitting, and bring hips to EOB. Assist to lower trunk and bring legs back into the bed.    Transfers                 General transfer comment: Unable to safely attempt with one person assist  Ambulation/Gait                Stairs            Wheelchair Mobility    Modified Rankin (Stroke Patients Only)       Balance Overall balance assessment: Needs assistance Sitting-balance support: Single extremity supported;Feet supported Sitting balance-Leahy Scale: Poor Sitting balance - Comments: Mod assist to sit EOB. Postural control: Left lateral lean                                   Pertinent Vitals/Pain Pain Assessment: Faces Faces Pain Scale: No hurt    Home  Living Family/patient expects to be discharged to:: Private residence Living Arrangements: Children;Spouse/significant other Available Help at Discharge: Family;Available 24 hours/day;Other (Comment);Personal care attendant Type of Home: House Home Access: Level entry     Home Layout: One level Home Equipment: Tub bench;Wheelchair - [employer];Hospital bed;Transport chair;Hand held shower head;Grab bars - tub/shower;Other (comment) (hemitwalker) Additional Comments: Information from prior encounter    Prior Function Level of Independence: Needs assistance   Gait / Transfers Assistance Needed: Unsure of how much assist for transfers. Pt is nonambulatory           Hand Dominance   Dominant Hand: Right    Extremity/Trunk Assessment   Upper Extremity Assessment Upper Extremity Assessment: RUE deficits/detail RUE Deficits / Details: No active movement noted. Flexion contracture    Lower Extremity Assessment Lower Extremity Assessment: RLE deficits/detail RLE Deficits / Details: Decr if any active movment. Difficult to assess due to communication deficits       Communication   Communication: Receptive difficulties;Expressive difficulties  Cognition Arousal/Alertness: Awake/alert Behavior During Therapy: Restless;Agitated Overall Cognitive Status: Difficult to assess                                 General Comments: pt  not following commands. At times pt became agitated.      General Comments      Exercises     Assessment/Plan    PT Assessment Patient needs continued PT services  PT Problem List Decreased strength;Decreased range of motion;Decreased activity tolerance;Decreased balance;Decreased mobility;Decreased cognition       PT Treatment Interventions DME instruction;Functional mobility training;Therapeutic activities;Therapeutic exercise;Balance training;Neuromuscular re-education;Cognitive remediation;Patient/family education     PT Goals (Current goals can be found in the Care Plan section)  Acute Rehab PT Goals Patient Stated Goal: Pt cannot state PT Goal Formulation: Patient unable to participate in goal setting Time For Goal Achievement: 09/21/20 Potential to Achieve Goals: Fair    Frequency Min 2X/week   Barriers to discharge        Co-evaluation               AM-PAC PT "6 Clicks" Mobility  Outcome Measure Help needed turning from your back to your side while in a flat bed without using bedrails?: Total Help needed moving from lying on your back to sitting on the side of a flat bed without using bedrails?: Total Help needed moving to and from a bed to a chair (including a wheelchair)?: Total Help needed standing up from a chair using your arms (e.g., wheelchair or bedside chair)?: Total Help needed to walk in hospital room?: Total Help needed climbing 3-5 steps with a railing? : Total 6 Click Score: 6    End of Session   Activity Tolerance: Patient limited by fatigue Patient left: in bed;with call bell/phone within reach;with bed alarm set   PT Visit Diagnosis: Other abnormalities of gait and mobility (R26.89);Hemiplegia and hemiparesis Hemiplegia - Right/Left: Right Hemiplegia - dominant/non-dominant: Dominant Hemiplegia - caused by: Cerebral infarction    Time: 8453-6468 PT Time Calculation (min) (ACUTE ONLY): 15 min   Charges:   PT Evaluation $PT Eval Moderate Complexity: 1 Mod          [facility] PT Acute Rehabilitation Services Pager [phone] Office [phone]   [facility] 09/07/2020, 6:40 PM

## 2020-09-08 ENCOUNTER — Inpatient Hospital Stay (HOSPITAL_COMMUNITY): Payer: Medicare Other

## 2020-09-08 DIAGNOSIS — R4182 Altered mental status, unspecified: Secondary | ICD-10-CM | POA: Diagnosis not present

## 2020-09-08 DIAGNOSIS — N3000 Acute cystitis without hematuria: Secondary | ICD-10-CM | POA: Diagnosis not present

## 2020-09-08 DIAGNOSIS — N179 Acute kidney failure, unspecified: Secondary | ICD-10-CM | POA: Diagnosis not present

## 2020-09-08 LAB — BASIC METABOLIC PANEL
Anion gap: 5 (ref 5–15)
BUN: 13 mg/dL (ref 8–23)
CO2: 29 mmol/L (ref 22–32)
Calcium: 9.5 mg/dL (ref 8.9–10.3)
Chloride: 103 mmol/L (ref 98–111)
Creatinine, Ser: 1.11 mg/dL (ref 0.61–1.24)
GFR, Estimated: >60 mL/min (ref >60)
Glucose, Bld: 136 mg/dL — ABNORMAL HIGH (ref 70–99)
Potassium: 3.8 mmol/L (ref 3.5–5.1)
Sodium: 137 mmol/L (ref 135–145)

## 2020-09-08 LAB — URINE CULTURE: Culture: >=100000 — AB

## 2020-09-08 LAB — BLOOD CULTURE ID PANEL (REFLEXED) - BCID2

## 2020-09-08 LAB — GLUCOSE, CAPILLARY
Glucose-Capillary: 105 mg/dL — ABNORMAL HIGH (ref 70–99)
Glucose-Capillary: 128 mg/dL — ABNORMAL HIGH (ref 70–99)
Glucose-Capillary: 195 mg/dL — ABNORMAL HIGH (ref 70–99)
Glucose-Capillary: 117 mg/dL — ABNORMAL HIGH (ref 70–99)

## 2020-09-08 MED ORDER — CEPHALEXIN 500 MG PO CAPS
500.0000 mg | ORAL_CAPSULE | Freq: Three times a day (TID) | ORAL | Status: AC
  Administered 2020-09-09 – 2020-09-12 (×12): 500 mg via ORAL
  Filled 2020-09-08 (×12): qty 1

## 2020-09-08 MED ORDER — SODIUM CHLORIDE 0.9 % IV SOLN: INTRAVENOUS | Status: AC

## 2020-09-08 NOTE — Progress Notes (Signed)
Proteus came back pan sens except for nitrofurantoin. Ok to optimize to PO keflex to complete 7d of therapy per Dr Waymon Amato.  Ulyses Southward, PharmD, BCIDP, AAHIVP, CPP Infectious Disease Pharmacist 09/08/2020 10:14 AM

## 2020-09-08 NOTE — Progress Notes (Signed)
SLP Cancellation Note  Patient Details Name: Douglas [name]. MRN: [id number] DOB: 10/09/1950   Cancelled treatment:       Reason Eval/Treat Not Completed: Patient refused.  Daughter reported after his last hospitalization he went to rehab and came home and has been eating and drinking regular foods and thin liquids. Today pt completely resisted any sips of water or offer of PO. He seemed upset. Pt typically only able to communicate with gestures and sometimes inaccurate Y/N due to prior stroke at baseline. Recommend pt continue his current diet since hes been tolerating it without negative consequence. Discussed with RN and MD.     Claudine Mouton 09/08/2020, 10:22 AM

## 2020-09-08 NOTE — Plan of Care (Signed)

## 2020-09-08 NOTE — Progress Notes (Signed)
PROGRESS NOTE   Douglas [name].  ZOX:[id number]    DOB: Feb 12, 1951    DOA: 09/06/2020  PCP: Caesar Bookman, NP   I have briefly reviewed patients previous medical records in [facility].  Chief Complaint  Patient presents with  . 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  Admitted for acute metabolic encephalopathy secondary to acute lower UTI and acute kidney injury. Despite resolution of AKI and appropriate treatment for his UTI, ongoing mental status changes and far from his baseline.  Assessment & Plan:  Active Problems:   UTI (urinary tract infection)   Encephalopathy   Acute metabolic encephalopathy complicating chronic stroke related encephalopathy CT head without acute findings.  Despite treating his AKI and UTI appropriately, ongoing mental status changes and currently far from his baseline as discussed extensively with his daughter.  Likely multifactorial due to AKI, UTI, hospitalization, sleep deprivation, complicating underlying stroke related vascular dementia.  Delirium precautions.  Reorient.  Proteus mirabilis acute cystitis without hematuria: Urine culture: >100 K colonies of Proteus mirabilis, pansensitive except to nitrofurantoin.  Treated empirically with IV ceftriaxone x3 days and then changed to orals Keflex to complete total 7 days course.  1 of 2 sets of blood cultures shows gram-positive cocci in clusters/Staphylococcus, likely contaminant.  Acute kidney injury complicating CKD stage II Creatinine 0.88 on 4/10.  Presented with creatinine of 1.84.  AKI likely related to poor oral intake.   Treated with IV fluids, AKI resolved.  However ongoing poor oral intake, refusing meds and meals this morning.  If continues, at risk again for AKI and hence we will continue IV fluids.  Essential hypertension Blood pressures mildly uncontrolled at times.  Continue reduced dose carvedilol.  Amlodipine on hold and may need to resume.  Orthostatic hypotension Remains on midodrine.  History of BPH Consider restarting Flomax after PVR.  History of multiple strokes with residual right hemiplegia with contractures Continue aspirin, Plavix and statins.  Hyperlipidemia Continue statins.  Seizure disorder Continue Lamictal.  No seizures reported.  However given ongoing mental status changes, will get spot EEG for completion.  Type II DM with hyperglycemia Hold Glucotrol, Levemir and Tradjenta.  Reasonable inpatient control on SSI.  Dysphagia Requested ST evaluation.  However per ST, daughter reported that he has been tolerating regular diet and thin liquids at home.  Moreover patient uncooperative with evaluation by ST and resisting any sips of water or oral intake, seemed upset.  ST recommended continuing his current diet i.e. regular diet and thin consistency.  Body mass index is 20.94 kg/m.    DVT prophylaxis: heparin injection 5,000 Units Start: 09/06/20 2000     Code Status: Full Code Family Communication: Discussed in detail with patient's daughter via phone, updated care and answered all questions. Disposition:  Status is: Inpatient  Remains inpatient appropriate because:Inpatient level of care appropriate due to severity of illness   Dispo: The patient is from: Home              Anticipated d/c is to: Home.              Patient currently is not medically stable to d/c.  Ongoing mental status  changes, far from his baseline, ongoing refusal to take meds or oral intake, thereby continuing IV fluids, further evaluation and management.   Difficult to place patient No         Consultants:   None  Procedures:   None  Antimicrobials:    Anti-infectives (From admission, onward)   Start     Dose/Rate Route Frequency Ordered Stop   09/09/20 0600  cephALEXin (KEFLEX) capsule 500 mg        500 mg Oral Every 8 hours 09/08/20 1012 09/13/20 0559   09/07/20 1000  cefTRIAXone (ROCEPHIN) 1 g in sodium chloride 0.9 % 100 mL IVPB  Status:  Discontinued        1 g 200 mL/hr over 30 Minutes Intravenous Every 24 hours 09/06/20 1857 09/08/20 1012   09/06/20 1545  cefTRIAXone (ROCEPHIN) 1 g in sodium chloride 0.9 % 100 mL IVPB        1 g 200 mL/hr over 30 Minutes Intravenous  Once 09/06/20 1543 09/06/20 1701        Subjective:  When seen this morning, awake, alert, tracking activities around the room with his eyes but nonverbal, not following any instructions.  Was tugging at his hospital gown.  A few minutes ago spoke with patient's daughter, she indicates that at baseline patient is a very pleasant person, laughs, says "yes or no" to questions, points to things that he needs, calls for help, able to stand and pivot to bedside commode or chair, sit in a chair etc.  She indicates that his mental status currently is far from his baseline, she is not sure if even recognizes her, appears somewhat agitated and does not know why.  As per RN, patient refused/uncooperative with his morning meds and breakfast and will try again at lunchtime.  Objective:   Vitals:   09/06/20 2145 09/06/20 2241 09/07/20 1237 09/07/20 2100  BP: 130/78 (!) 152/83 137/79 (!) 146/92  Pulse: 82  64 74  Resp:   20 20  Temp:   98.2 F (36.8 C) 98.3 F (36.8 C)  TempSrc:  Oral    SpO2: 100%  100% 100%  Weight:      Height:        General exam: Elderly male, moderately built and frail, chronically ill looking, lying comfortably propped up in bed without distress. Respiratory system: Clear to auscultation.  No increased work of breathing. Cardiovascular system: S1 and S2 heard, RRR.  No JVD,  murmurs or pedal edema.  Telemetry personally reviewed: Sinus rhythm. Gastrointestinal system: Abdomen is nondistended, soft and nontender. No organomegaly or masses felt. Normal bowel sounds heard. Central nervous system: Alert but unable to assess orientation today, nonverbal and does not follow instructions.  Dysarthria, facial asymmetry present, all likely chronic Extremities: Right upper extremity contracture.  Right lower extremity 0/5 power.  Left upper extremity grade 5 x 5 power.  Left lower extremity at least grade 2 x 5 power.  Purposeful movement of left upper extremity, pulling at his hospital gown. Skin: No rashes, lesions or ulcers Psychiatry: Judgement and insight impaired. Mood & affect cannot be assessed.     Data Reviewed:   I have personally reviewed following labs and imaging studies   CBC: Recent Labs  Lab 09/06/20 1434 09/07/20 0430  WBC 5.7 6.2  NEUTROABS 3.6  --   HGB 14.7 12.6*  HCT 46.4 39.0  MCV 93.7 92.6  PLT 212 212    Basic Metabolic Panel: Recent Labs  Lab 09/06/20 1434  09/07/20 0430 09/08/20 0230  NA 139 141 137  K 4.1 3.9 3.8  CL 104 104 103  CO2 22 30 29   GLUCOSE 191* 157* 136*  BUN 37* 27* 13  CREATININE 1.84* 1.55* 1.11  CALCIUM 9.6 9.6 9.5    Liver Function Tests: No results for input(s): AST, ALT, ALKPHOS, BILITOT, PROT, ALBUMIN in the last 168 hours.  CBG: Recent Labs  Lab 09/07/20 2100 09/08/20 0801 09/08/20 1157  GLUCAP 140* 105* 128*    Microbiology Studies:   Recent Results (from the past 240 hour(s))  Urine culture     Status: Abnormal   Collection Time: 09/06/20  3:45 PM   Specimen: Urine, Random  Result Value Ref Range Status   Specimen Description URINE, RANDOM  Final   Special Requests   Final    NONE Performed at [facility], [address], [place], [place]    Culture >=100,000 COLONIES/mL PROTEUS MIRABILIS (A)  Final   Report Status 09/08/2020 FINAL  Final   Organism ID, Bacteria  PROTEUS MIRABILIS (A)  Final      Susceptibility   Proteus mirabilis - MIC*    AMPICILLIN <=2 SENSITIVE Sensitive     CEFAZOLIN <=4 SENSITIVE Sensitive     CEFEPIME <=0.12 SENSITIVE Sensitive     CEFTRIAXONE <=0.25 SENSITIVE Sensitive     CIPROFLOXACIN <=0.25 SENSITIVE Sensitive     GENTAMICIN <=1 SENSITIVE Sensitive     IMIPENEM 4 SENSITIVE Sensitive     NITROFURANTOIN 128 RESISTANT Resistant     TRIMETH/SULFA <=20 SENSITIVE Sensitive     AMPICILLIN/SULBACTAM <=2 SENSITIVE Sensitive     PIP/TAZO <=4 SENSITIVE Sensitive     * >=100,000 COLONIES/mL PROTEUS MIRABILIS  Culture, blood (routine x 2)     Status: None (Preliminary result)   Collection Time: 09/06/20  4:04 PM   Specimen: BLOOD LEFT FOREARM  Result Value Ref Range Status   Specimen Description BLOOD LEFT FOREARM  Final   Special Requests   Final    BOTTLES DRAWN AEROBIC AND ANAEROBIC Blood Culture results may not be optimal due to an inadequate volume of blood received in culture bottles   Culture  Setup Time   Final    GRAM POSITIVE COCCI IN CLUSTERS AEROBIC BOTTLE ONLY CRITICAL RESULT CALLED TO, READ BACK BY AND VERIFIED WITH: PHARMD DANIEL L. 11/06/20 FCP Performed at [facility], [address] [place], [place]    Culture GRAM POSITIVE COCCI  Final   Report Status PENDING  Incomplete  Blood Culture ID Panel (Reflexed)     Status: Abnormal   Collection Time: 09/06/20  4:04 PM  Result Value Ref Range Status   Enterococcus faecalis NOT DETECTED NOT DETECTED Final   Enterococcus Faecium NOT DETECTED NOT DETECTED Final   Listeria monocytogenes NOT DETECTED NOT DETECTED Final   Staphylococcus species DETECTED (A) NOT DETECTED Final    Comment: CRITICAL RESULT CALLED TO, READ BACK BY AND VERIFIED WITH: PHARMD DANIEL L. 0411 11/06/20 FCP    Staphylococcus aureus (BCID) NOT DETECTED NOT DETECTED Final   Staphylococcus epidermidis NOT DETECTED NOT DETECTED Final   Staphylococcus lugdunensis NOT DETECTED  NOT DETECTED Final   Streptococcus species NOT DETECTED NOT DETECTED Final   Streptococcus agalactiae NOT DETECTED NOT DETECTED Final   Streptococcus pneumoniae NOT DETECTED NOT DETECTED Final   Streptococcus pyogenes NOT DETECTED NOT DETECTED Final   A.calcoaceticus-baumannii NOT DETECTED NOT DETECTED Final   Bacteroides fragilis NOT DETECTED NOT DETECTED Final  Enterobacterales NOT DETECTED NOT DETECTED Final   Enterobacter cloacae complex NOT DETECTED NOT DETECTED Final   Escherichia coli NOT DETECTED NOT DETECTED Final   Klebsiella aerogenes NOT DETECTED NOT DETECTED Final   Klebsiella oxytoca NOT DETECTED NOT DETECTED Final   Klebsiella pneumoniae NOT DETECTED NOT DETECTED Final   Proteus species NOT DETECTED NOT DETECTED Final   Salmonella species NOT DETECTED NOT DETECTED Final   Serratia marcescens NOT DETECTED NOT DETECTED Final   Haemophilus influenzae NOT DETECTED NOT DETECTED Final   Neisseria meningitidis NOT DETECTED NOT DETECTED Final   Pseudomonas aeruginosa NOT DETECTED NOT DETECTED Final   Stenotrophomonas maltophilia NOT DETECTED NOT DETECTED Final   Candida albicans NOT DETECTED NOT DETECTED Final   Candida auris NOT DETECTED NOT DETECTED Final   Candida glabrata NOT DETECTED NOT DETECTED Final   Candida krusei NOT DETECTED NOT DETECTED Final   Candida parapsilosis NOT DETECTED NOT DETECTED Final   Candida tropicalis NOT DETECTED NOT DETECTED Final   Cryptococcus neoformans/gattii NOT DETECTED NOT DETECTED Final    Comment: Performed at [facility], [address], [place], [place] [address]  Culture, blood (routine x 2)     Status: None (Preliminary result)   Collection Time: 09/06/20  4:11 PM   Specimen: BLOOD RIGHT FOREARM  Result Value Ref Range Status   Specimen Description BLOOD RIGHT FOREARM  Final   Special Requests   Final    BOTTLES DRAWN AEROBIC ONLY Blood Culture adequate volume   Culture   Final    NO GROWTH 2 DAYS Performed at [facility], [address], [place], [place] [address]    Report Status PENDING  Incomplete     Radiology Studies:  CT Head Wo Contrast  Result Date: 09/06/2020 CLINICAL DATA:  Altered mental status. EXAM: CT HEAD WITHOUT CONTRAST TECHNIQUE: Contiguous axial images were obtained from the base of the skull through the vertex without intravenous contrast. COMPARISON:  August 09, 2020 FINDINGS: Brain: There is mild cerebral atrophy with widening of the extra-axial spaces and ventricular dilatation. There are areas of decreased attenuation within the white matter tracts of the supratentorial brain, consistent with microvascular disease changes. A large area of cortical encephalomalacia and adjacent chronic white matter low attenuation is seen throughout the left hemisphere, involving the left MCA and left PCA territories. Associated ex vacuo dilatation of the left lateral ventricle is seen. Vascular: No hyperdense vessel or unexpected calcification. Skull: Normal. Negative for fracture or focal lesion. Sinuses/Orbits: No acute finding. Other: None. IMPRESSION: 1. Generalized cerebral atrophy. 2. Large chronic left MCA and left PCA territory infarct. 3. No acute intracranial abnormality. Electronically Signed   By: Aram Candela M.D.   On: 09/06/2020 16:56   DG Chest Port 1 View  Result Date: 09/06/2020 CLINICAL DATA:  Altered mental status EXAM: PORTABLE CHEST 1 VIEW COMPARISON:  08/09/2020 FINDINGS: Cardiac shadows within normal limits. Loop recorder is again noted. The lungs are clear. Previously seen nodular density in the right base is well seen but extrinsic to the chest wall consistent with prior nipple shadow. No focal infiltrate is seen. No bony noted. IMPRESSION: No acute abnormality seen. Previously noted nodule corresponds to a nipple shadow extrinsic to the chest wall. Electronically Signed   By: Alcide Clever M.D.   On: 09/06/2020 17:32     Scheduled Meds:   . aspirin EC  325 mg Oral q  morning  . atorvastatin  40 mg Oral Q0600  . baclofen  20 mg Oral BID  . carvedilol  6.25 mg Oral BID WC  . [START ON 09/09/2020] cephALEXin  500 mg Oral Q8H  . clopidogrel  75 mg Oral Daily  . heparin  5,000 Units Subcutaneous Q12H  . insulin aspart  0-9 Units Subcutaneous TID WC  . lamoTRIgine  150 mg Oral BID  . midodrine  2.5 mg Oral BID WC  . traZODone  50 mg Oral QHS    Continuous Infusions:      LOS: 2 days     Marcellus Scott, MD, [place], [facility]. [facility]    To contact the attending provider between 7A-7P or the covering provider during after hours 7P-7A, please log into the web site [email or web address] and access using universal [place] password for that web site. If you do not have the password, please call the hospital operator.  09/08/2020, 12:02 PM

## 2020-09-08 NOTE — Progress Notes (Signed)
PHARMACY - PHYSICIAN COMMUNICATION CRITICAL VALUE ALERT - BLOOD CULTURE IDENTIFICATION (BCID)  Sally Reimers. is an 70 y.o. male who presented to [facility] on 09/06/2020 with a chief complaint of altered mental status  Assessment:  WBC WNL, afebrile  Name of physician (or Provider) Contacted: Dr. Antionette Char  Current antibiotics: Ceftriaxone  Changes to prescribed antibiotics recommended:  None  Results for orders placed or performed during the hospital encounter of 09/06/20  Blood Culture ID Panel (Reflexed) (Collected: 09/06/2020  4:04 PM)  Result Value Ref Range   Enterococcus faecalis NOT DETECTED NOT DETECTED   Enterococcus Faecium NOT DETECTED NOT DETECTED   Listeria monocytogenes NOT DETECTED NOT DETECTED   Staphylococcus species DETECTED (A) NOT DETECTED   Staphylococcus aureus (BCID) NOT DETECTED NOT DETECTED   Staphylococcus epidermidis NOT DETECTED NOT DETECTED   Staphylococcus lugdunensis NOT DETECTED NOT DETECTED   Streptococcus species NOT DETECTED NOT DETECTED   Streptococcus agalactiae NOT DETECTED NOT DETECTED   Streptococcus pneumoniae NOT DETECTED NOT DETECTED   Streptococcus pyogenes NOT DETECTED NOT DETECTED   A.calcoaceticus-baumannii NOT DETECTED NOT DETECTED   Bacteroides fragilis NOT DETECTED NOT DETECTED   Enterobacterales NOT DETECTED NOT DETECTED   Enterobacter cloacae complex NOT DETECTED NOT DETECTED   Escherichia coli NOT DETECTED NOT DETECTED   Klebsiella aerogenes NOT DETECTED NOT DETECTED   Klebsiella oxytoca NOT DETECTED NOT DETECTED   Klebsiella pneumoniae NOT DETECTED NOT DETECTED   Proteus species NOT DETECTED NOT DETECTED   Salmonella species NOT DETECTED NOT DETECTED   Serratia marcescens NOT DETECTED NOT DETECTED   Haemophilus influenzae NOT DETECTED NOT DETECTED   Neisseria meningitidis NOT DETECTED NOT DETECTED   Pseudomonas aeruginosa NOT DETECTED NOT DETECTED   Stenotrophomonas maltophilia NOT DETECTED NOT DETECTED   Candida  albicans NOT DETECTED NOT DETECTED   Candida auris NOT DETECTED NOT DETECTED   Candida glabrata NOT DETECTED NOT DETECTED   Candida krusei NOT DETECTED NOT DETECTED   Candida parapsilosis NOT DETECTED NOT DETECTED   Candida tropicalis NOT DETECTED NOT DETECTED   Cryptococcus neoformans/gattii NOT DETECTED NOT DETECTED    Abran Duke 09/08/2020  4:29 AM

## 2020-09-08 NOTE — Progress Notes (Signed)
pts daughter visited today and fed him lunch, pt ate 100% of his lunch tray

## 2020-09-08 NOTE — Procedures (Signed)
Patient Name: Douglas [name].  MRN: [id number]  Epilepsy Attending: Charlsie Quest  Referring Physician/Provider: Dr Corinna Lines Date: 09/08/2020 Duration: 25.53 mins  Patient history: 70yo m with ams. EEG to evaluate for seizure  Level of alertness: Awake,asleep  AEDs during EEG study: LTG  Technical aspects: This EEG study was done with scalp electrodes positioned according to the 10-20 International system of electrode placement. Electrical activity was acquired at a sampling rate of 500Hz  and reviewed with a high frequency filter of 70Hz  and a low frequency filter of 1Hz . EEG data were recorded continuously and digitally stored.   Description: The posterior dominant rhythm consists of 8-9 Hz activity of moderate voltage (25-35 uV) seen predominantly in posterior head regions, asymmetric ( L<R) and reactive to eye opening and eye closing. Sleep was characterized by vertex waves, sleep spindles (12 to 14 Hz), maximal right frontocentral region.  EEG showed continuous low amplitude 3 to 6 Hz theta-delta slowing in left hemisphere.  Hyperventilation and photic stimulation were not performed.     ABNORMALITY - Continuous slow,  left hemisphere - Background asymmetry, left<right   IMPRESSION: This study is suggestive of cortical dysfunction arising from left hemisphere likely secondary to underlying structural abnormality/ stroke. No seizures or epileptiform discharges were seen throughout the recording.  Douglas [name] 

## 2020-09-08 NOTE — Progress Notes (Signed)
EEG Completed; Results Pending  

## 2020-09-08 NOTE — Progress Notes (Signed)
Pt ate 100% of lunch and 100% of his dinner

## 2020-09-09 DIAGNOSIS — N3000 Acute cystitis without hematuria: Secondary | ICD-10-CM | POA: Diagnosis not present

## 2020-09-09 LAB — BASIC METABOLIC PANEL
Anion gap: 7 (ref 5–15)
BUN: 14 mg/dL (ref 8–23)
CO2: 27 mmol/L (ref 22–32)
Calcium: 9 mg/dL (ref 8.9–10.3)
Chloride: 102 mmol/L (ref 98–111)
Creatinine, Ser: 1.19 mg/dL (ref 0.61–1.24)
GFR, Estimated: >60 mL/min (ref >60)
Glucose, Bld: 219 mg/dL — ABNORMAL HIGH (ref 70–99)
Potassium: 3.7 mmol/L (ref 3.5–5.1)
Sodium: 136 mmol/L (ref 135–145)

## 2020-09-09 LAB — GLUCOSE, CAPILLARY
Glucose-Capillary: 151 mg/dL — ABNORMAL HIGH (ref 70–99)
Glucose-Capillary: 163 mg/dL — ABNORMAL HIGH (ref 70–99)
Glucose-Capillary: 162 mg/dL — ABNORMAL HIGH (ref 70–99)
Glucose-Capillary: 161 mg/dL — ABNORMAL HIGH (ref 70–99)

## 2020-09-09 MED ORDER — SODIUM CHLORIDE 0.9 % IV SOLN: INTRAVENOUS | Status: DC

## 2020-09-09 MED ORDER — TAMSULOSIN HCL 0.4 MG PO CAPS
0.4000 mg | ORAL_CAPSULE | Freq: Every day | ORAL | Status: DC
  Administered 2020-09-09 – 2020-09-13 (×5): 0.4 mg via ORAL
  Filled 2020-09-09 (×5): qty 1

## 2020-09-09 NOTE — Progress Notes (Signed)
PROGRESS NOTE  Douglas [name].  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  Admitted for acute metabolic encephalopathy secondary to acute lower UTI and acute kidney injury. Despite resolution of AKI and appropriate treatment for his UTI, ongoing mental status changes and far from his baseline. Repeat PT, OT, and SLP evaluations are pending to inform disposition.  Assessment & Plan: Active Problems:   UTI (urinary tract infection)   Encephalopathy  Acute metabolic encephalopathy: Multifactorial due to AKI, UTI, and possibly part of stepwise worsening of vascular dementia. CT head nonacute. EEG 5/10 suggestive of cortical dysfunction centered around left hemisphere affected by stroke. No epileptiform discharges noted. - Delirium precautions, suspect mentation would improve in familiar environment (i.e. home). Daughter at bedside is helpful and improves his po intake.   Dysphagia:  - SLP evaluation requested per daughter.   Proteus UTI:  - Continue keflex with plans to complete 7 days Tx. 1 of 4 blood culture bottles with GPC reincubated though favored to be contaminant in this setting. Remains afebrile.   AKI on stage II CKD: SCr 1.84 on admission, back near baseline of 0.8-0.9. Resolved with IVF.  - Continue IV fluids while po intake is poor.   History of CVAs with residual spastic right hemiplegia, HLD:  - Continue DAPT, statin.  T2DM with hyperglycemia:  - Continue SSI.   Orthostatic hypotension:  - Continue midodrine  BPH:  - Continue tamsulosin  Severe  protein-calorie malnutrition: Appears to have decreased muscle bulk, weakness.  - Supplement protein as able.   Seizure disorder:  - Continue lamictal, no seizure noted clinically or by EEG.  DVT prophylaxis: Heparin Code Status: Full Family Communication: Daughter at bedside Disposition Plan:  Status is: Inpatient  Remains inpatient appropriate because:Altered mental status and Unsafe d/c plan   Dispo: The patient is from: Home              Anticipated d/c is to: TBD. Asked PT and OT and SLP to evaluate the patient while daughter is there to inform PLOF to determine optimal disposition venue/need for PT going forward.              Patient currently is not medically stable to d/c.   Difficult to place patient No  Consultants:   None  Procedures:   None  Antimicrobials:  Ceftriaxone, keflex   Subjective: Pt does say "yes" and "no" inconsistently in response to questions, pointing to the left intermittently but otherwise not able to give history. No overnight events noted.   Objective: Vitals:   09/08/20 1215 09/08/20 2037 09/09/20 0501 09/09/20 1358  BP: (!) 141/93 122/79 139/87 134/83  Pulse: 90 73 63 81  Resp: 17 20 18    Temp: 97.6 F (36.4 C) 98.4 F (36.9 C) 98.1 F (36.7 C) 97.7 F (36.5 C)  TempSrc:  Axillary Axillary Oral  SpO2: 99% 99% 100% 100%  Weight:      Height:        Intake/Output Summary (Last 24 hours) at 09/09/2020 1627 Last data filed at 09/09/2020 0600 Gross per 24 hour  Intake 720 ml  Output 300 ml  Net 420 ml  Filed Weights   09/06/20 1409  Weight: 72 kg    Gen: Thin chronically ill-appearing man in no distress. Pulm: Non-labored breathing. Clear to auscultation bilaterally.  CV: Regular rate and rhythm. No murmur, rub, or gallop. No JVD, no pedal edema. GI: Abdomen soft, non-tender, non-distended, with normoactive bowel sounds. No organomegaly or masses felt. Ext: Warm, no deformities, sarcopenic. Skin: No rashes, lesions or  ulcers on visualized skin. Neuro: Alert and unable to cooperate with exam. Right spastic hemiparesis noted with possible neglect as well.  Psych: UTD.  Data Reviewed: I have personally reviewed following labs and imaging studies  CBC: Recent Labs  Lab 09/06/20 1434 09/07/20 0430  WBC 5.7 6.2  NEUTROABS 3.6  --   HGB 14.7 12.6*  HCT 46.4 39.0  MCV 93.7 92.6  PLT 212 212   Basic Metabolic Panel: Recent Labs  Lab 09/06/20 1434 09/07/20 0430 09/08/20 0230 09/09/20 0221  NA 139 141 137 136  K 4.1 3.9 3.8 3.7  CL 104 104 103 102  CO2 22 30 29 27   GLUCOSE 191* 157* 136* 219*  BUN 37* 27* 13 14  CREATININE 1.84* 1.55* 1.11 1.19  CALCIUM 9.6 9.6 9.5 9.0   GFR: Estimated Creatinine Clearance: 58.8 mL/min (by C-G formula based on SCr of 1.19 mg/dL). Liver Function Tests: No results for input(s): AST, ALT, ALKPHOS, BILITOT, PROT, ALBUMIN in the last 168 hours. No results for input(s): LIPASE, AMYLASE in the last 168 hours. No results for input(s): AMMONIA in the last 168 hours. Coagulation Profile: No results for input(s): INR, PROTIME in the last 168 hours. Cardiac Enzymes: No results for input(s): CKTOTAL, CKMB, CKMBINDEX, TROPONINI in the last 168 hours. BNP (last 3 results) No results for input(s): PROBNP in the last 8760 hours. HbA1C: Recent Labs    09/06/20 1858  HGBA1C 6.8*   CBG: Recent Labs  Lab 09/08/20 1157 09/08/20 1623 09/08/20 2040 09/09/20 0801 09/09/20 1130  GLUCAP 128* 195* 117* 151* 163*   Lipid Profile: No results for input(s): CHOL, HDL, LDLCALC, TRIG, CHOLHDL, LDLDIRECT in the last 72 hours. Thyroid Function Tests: No results for input(s): TSH, T4TOTAL, FREET4, T3FREE, THYROIDAB in the last 72 hours. Anemia Panel: No results for input(s): VITAMINB12, FOLATE, FERRITIN, TIBC, IRON, RETICCTPCT in the last 72 hours. Urine analysis:    Component Value Date/Time   COLORURINE YELLOW 09/06/2020 1452   APPEARANCEUR HAZY (A) 09/06/2020 1452    LABSPEC 1.017 09/06/2020 1452   PHURINE 5.0 09/06/2020 1452   GLUCOSEU NEGATIVE 09/06/2020 1452   HGBUR LARGE (A) 09/06/2020 1452   BILIRUBINUR NEGATIVE 09/06/2020 1452   KETONESUR NEGATIVE 09/06/2020 1452   PROTEINUR NEGATIVE 09/06/2020 1452   NITRITE NEGATIVE 09/06/2020 1452   LEUKOCYTESUR LARGE (A) 09/06/2020 1452   Recent Results (from the past 240 hour(s))  Urine culture     Status: Abnormal   Collection Time: 09/06/20  3:45 PM   Specimen: Urine, Random  Result Value Ref Range Status   Specimen Description URINE, RANDOM  Final   Special Requests   Final    NONE Performed at [facility], [address], [place], [place]    Culture >=100,000 COLONIES/mL PROTEUS MIRABILIS (A)  Final   Report Status 09/08/2020 FINAL  Final   Organism ID, Bacteria PROTEUS MIRABILIS (A)  Final      Susceptibility   Proteus mirabilis - MIC*    AMPICILLIN <=2 SENSITIVE Sensitive     CEFAZOLIN <=4 SENSITIVE Sensitive     CEFEPIME <=  0.12 SENSITIVE Sensitive     CEFTRIAXONE <=0.25 SENSITIVE Sensitive     CIPROFLOXACIN <=0.25 SENSITIVE Sensitive     GENTAMICIN <=1 SENSITIVE Sensitive     IMIPENEM 4 SENSITIVE Sensitive     NITROFURANTOIN 128 RESISTANT Resistant     TRIMETH/SULFA <=20 SENSITIVE Sensitive     AMPICILLIN/SULBACTAM <=2 SENSITIVE Sensitive     PIP/TAZO <=4 SENSITIVE Sensitive     * >=100,000 COLONIES/mL PROTEUS MIRABILIS  Culture, blood (routine x 2)     Status: None (Preliminary result)   Collection Time: 09/06/20  4:04 PM   Specimen: BLOOD LEFT FOREARM  Result Value Ref Range Status   Specimen Description BLOOD LEFT FOREARM  Final   Special Requests   Final    BOTTLES DRAWN AEROBIC AND ANAEROBIC Blood Culture results may not be optimal due to an inadequate volume of blood received in culture bottles   Culture  Setup Time   Final    GRAM POSITIVE COCCI IN CLUSTERS AEROBIC BOTTLE ONLY CRITICAL RESULT CALLED TO, READ BACK BY AND VERIFIED WITH: PHARMD DANIEL L. [id number]  [id number] FCP    Culture   Final    GRAM POSITIVE COCCI CULTURE REINCUBATED FOR BETTER GROWTH Performed at [facility], [address], [place], [place] [address]    Report Status PENDING  Incomplete  Blood Culture ID Panel (Reflexed)     Status: Abnormal   Collection Time: 09/06/20  4:04 PM  Result Value Ref Range Status   Enterococcus faecalis NOT DETECTED NOT DETECTED Final   Enterococcus Faecium NOT DETECTED NOT DETECTED Final   Listeria monocytogenes NOT DETECTED NOT DETECTED Final   Staphylococcus species DETECTED (A) NOT DETECTED Final    Comment: CRITICAL RESULT CALLED TO, READ BACK BY AND VERIFIED WITH: PHARMD DANIEL L. [id number] FCP    Staphylococcus aureus (BCID) NOT DETECTED NOT DETECTED Final   Staphylococcus epidermidis NOT DETECTED NOT DETECTED Final   Staphylococcus lugdunensis NOT DETECTED NOT DETECTED Final   Streptococcus species NOT DETECTED NOT DETECTED Final   Streptococcus agalactiae NOT DETECTED NOT DETECTED Final   Streptococcus pneumoniae NOT DETECTED NOT DETECTED Final   Streptococcus pyogenes NOT DETECTED NOT DETECTED Final   A.calcoaceticus-baumannii NOT DETECTED NOT DETECTED Final   Bacteroides fragilis NOT DETECTED NOT DETECTED Final   Enterobacterales NOT DETECTED NOT DETECTED Final   Enterobacter cloacae complex NOT DETECTED NOT DETECTED Final   Escherichia coli NOT DETECTED NOT DETECTED Final   Klebsiella aerogenes NOT DETECTED NOT DETECTED Final   Klebsiella oxytoca NOT DETECTED NOT DETECTED Final   Klebsiella pneumoniae NOT DETECTED NOT DETECTED Final   Proteus species NOT DETECTED NOT DETECTED Final   Salmonella species NOT DETECTED NOT DETECTED Final   Serratia marcescens NOT DETECTED NOT DETECTED Final   Haemophilus influenzae NOT DETECTED NOT DETECTED Final   Neisseria meningitidis NOT DETECTED NOT DETECTED Final   Pseudomonas aeruginosa NOT DETECTED NOT DETECTED Final   Stenotrophomonas maltophilia NOT DETECTED NOT DETECTED Final    Candida albicans NOT DETECTED NOT DETECTED Final   Candida auris NOT DETECTED NOT DETECTED Final   Candida glabrata NOT DETECTED NOT DETECTED Final   Candida krusei NOT DETECTED NOT DETECTED Final   Candida parapsilosis NOT DETECTED NOT DETECTED Final   Candida tropicalis NOT DETECTED NOT DETECTED Final   Cryptococcus neoformans/gattii NOT DETECTED NOT DETECTED Final    Comment: Performed at [facility], [address] [place], [place] [address]  Culture, blood (routine x 2)  Status: None (Preliminary result)   Collection Time: 09/06/20  4:11 PM   Specimen: BLOOD RIGHT FOREARM  Result Value Ref Range Status   Specimen Description BLOOD RIGHT FOREARM  Final   Special Requests   Final    BOTTLES DRAWN AEROBIC ONLY Blood Culture adequate volume   Culture   Final    NO GROWTH 3 DAYS Performed at [facility], [address] [place], [place] [address]    Report Status PENDING  Incomplete      Radiology Studies: EEG adult  Result Date: 09/08/2020 Charlsie QuestYadav, Priyanka O, MD     09/08/2020  6:29 PM Patient Name: Douglas CordiaWilliam L Reamer Jr. MRN: [id number] Epilepsy Attending: Charlsie QuestPriyanka O Yadav Referring Physician/Provider: Dr Corinna LinesAnand Honglagi Date: 09/08/2020 Duration: 25.53 mins Patient history: 70yo m with ams. EEG to evaluate for seizure Level of alertness: Awake,asleep AEDs during EEG study: LTG Technical aspects: This EEG study was done with scalp electrodes positioned according to the 10-20 International system of electrode placement. Electrical activity was acquired at a sampling rate of 500Hz  and reviewed with a high frequency filter of 70Hz  and a low frequency filter of 1Hz . EEG data were recorded continuously and digitally stored. Description: The posterior dominant rhythm consists of 8-9 Hz activity of moderate voltage (25-35 uV) seen predominantly in posterior head regions, asymmetric ( L<R) and reactive to eye opening and eye closing. Sleep was characterized by vertex waves, sleep  spindles (12 to 14 Hz), maximal right frontocentral region.  EEG showed continuous low amplitude 3 to 6 Hz theta-delta slowing in left hemisphere.  Hyperventilation and photic stimulation were not performed.   ABNORMALITY - Continuous slow,  left hemisphere - Background asymmetry, left<right IMPRESSION: This study is suggestive of cortical dysfunction arising from left hemisphere likely secondary to underlying structural abnormality/ stroke. No seizures or epileptiform discharges were seen throughout the recording. Priyanka Annabelle Harman Yadav    Scheduled Meds: . aspirin EC  325 mg Oral q morning  . atorvastatin  40 mg Oral Q0600  . baclofen  20 mg Oral BID  . carvedilol  6.25 mg Oral BID WC  . cephALEXin  500 mg Oral Q8H  . clopidogrel  75 mg Oral Daily  . heparin  5,000 Units Subcutaneous Q12H  . insulin aspart  0-9 Units Subcutaneous TID WC  . lamoTRIgine  150 mg Oral BID  . midodrine  2.5 mg Oral BID WC  . traZODone  50 mg Oral QHS   Continuous Infusions:   LOS: 3 days   Time spent: 25 minutes.  Tyrone Nineyan B Ike Maragh, MD [facility] [email or web address] 09/09/2020, 4:27 PM

## 2020-09-09 NOTE — Care Management Important Message (Signed)
Important Message  Patient Details  Name: Douglas [name]. MRN: [id number] Date of Birth: 1950/12/22   Medicare Important Message Given:  Yes     Tashana Haberl 09/09/2020, 2:28 PM

## 2020-09-09 NOTE — Progress Notes (Signed)
Clinical/Bedside Swallow Evaluation Patient Details  Name: Douglas [name]. MRN: [id number] Date of Birth: 12-14-1950  Today's Date: 09/09/2020 Time: SLP Start Time (ACUTE ONLY): 1613 SLP Stop Time (ACUTE ONLY): 1645 SLP Time Calculation (min) (ACUTE ONLY): 32.98 min  Past Medical History:  Past Medical History:  Diagnosis Date  . Acute blood loss anemia   . Acute embolic stroke (HCC)   . Acute lower UTI   . AMS (altered mental status) 06/05/2019  . Cerebral edema (HCC) 04/04/2018  . Diabetes 1.5, managed as type 2 (HCC)   . Diabetes mellitus without complication (HCC)   . High cholesterol   . High glucose    Diabetes  . History of CVA with residual deficit   . History of Dysphagia, post-stroke   . Hypertension   . Ischemic stroke (HCC) 03/05/2019  . Labile blood glucose   . 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 Renal insufficiency 04/04/2018  . Seizures (HCC)   . Stroke (HCC)   . UTI (urinary tract infection) 03/05/2019   Past Surgical History:  Past Surgical History:  Procedure Laterality Date  . IR PATIENT EVAL TECH 0-60 MINS  03/30/2018  . LOOP RECORDER INSERTION N/A 03/08/2019   Procedure: LOOP RECORDER INSERTION;  Surgeon: Regan Lemming, MD;  Location: [facility] INVASIVE CV LAB;  Service: Cardiovascular;  Laterality: N/A;  . TEE WITHOUT CARDIOVERSION N/A 04/02/2018   Procedure: TRANSESOPHAGEAL ECHOCARDIOGRAM (TEE);  Surgeon: [name], MD;  Location: [facility] ENDOSCOPY;  Service: Cardiovascular;  Laterality: N/A;   HPI:  70 year old male with medical history significant for but not limited to multiple CVAs with residual right-sided hemiplegia at baseline severe expressive aphasia with receptive impairment, communicates with Y/N and gestures. Pt presented with altered mentation: bizarre behavior and frequent episodes of confusion along with decreased oral intake.  Admitted for acute metabolic encephalopathy secondary to acute lower UTI and acute kidney  injury.Pt has a history of dysphagia, last MBS in 12/21 recommended "dys 1, nectar thick liquids with full supervision for use of the following swallow precautions: avoid mixed consistencies, small bites and sips, upright for meals and for 30-60 min after, meds crushed in puree." Daughter reports that pt went to rehab after that admission and by the time he was home he was back on regular diet and thin liquids.   Assessment / Plan / Recommendation Clinical Impression  Pt was seen for bedside swallow evaluation. [name] was at bedside and she denied the pt having any signs of aspiration prior to admission, but expressed concerns regarding the pt demonstrating coughing yesterday with lunch. Dentition was adequate, but pt was unable to participate in a complete oral mechanism exam. Pt demonstrated impulsive tendencies throughout the evaluation. Signs of aspiration were noted with thin liquids via cup and straw even when a reduced rate was used. Intake rate was increased with regular textures, but mastication and oral clearance were adequate and no symptoms of laryngeal invasion were noted with solids. A regular texture diet with nectar thick liquids is recommended at this time. A modified barium swallow study will be conducted on 5/12 to further assess swallow function. SLP Visit Diagnosis: Dysphagia, unspecified (R13.10)    Aspiration Risk  Mild aspiration risk;Moderate aspiration risk    Diet Recommendation Regular;Nectar-thick liquid   Liquid Administration via: Cup;No straw Medication Administration: Whole meds with puree Supervision: Full supervision/cueing for compensatory strategies Compensations: Slow rate;Small sips/bites Postural Changes: Seated upright at 90 degrees    Other  Recommendations Oral  Care Recommendations: Oral care BID   Follow up Recommendations  (TBD)      Frequency and Duration min 2x/week  1 week       Prognosis Prognosis for Safe Diet Advancement: Good       Swallow Study   General Date of Onset: 04/22/20 HPI: 70 year old male with medical history significant for but not limited to multiple CVAs with residual right-sided hemiplegia at baseline severe expressive aphasia with receptive impairment, communicates with Y/N and gestures. Pt presented with altered mentation: bizarre behavior and frequent episodes of confusion along with decreased oral intake.  Admitted for acute metabolic encephalopathy secondary to acute lower UTI and acute kidney injury.Pt has a history of dysphagia, last MBS in 12/21 recommended "dys 1, nectar thick liquids with full supervision for use of the following swallow precautions: avoid mixed consistencies, small bites and sips, upright for meals and for 30-60 min after, meds crushed in puree." Daughter reports that pt went to rehab after that admission and by the time he was home he was back on regular diet and thin liquids. Type of Study: Bedside Swallow Evaluation Previous Swallow Assessment: See HPI Diet Prior to this Study: Regular;Thin liquids Temperature Spikes Noted: No Respiratory Status: Room air History of Recent Intubation: No Behavior/Cognition: Alert;Cooperative;Pleasant mood Oral Cavity Assessment: Within Functional Limits Oral Care Completed by SLP: No Oral Cavity - Dentition: Adequate natural dentition Vision: Functional for self-feeding Self-Feeding Abilities: Able to feed self;Needs assist Patient Positioning: Upright in bed;Postural control adequate for testing Baseline Vocal Quality: Normal Volitional Swallow: Able to elicit    Oral/Motor/Sensory Function Overall Oral Motor/Sensory Function:  (Difficult to assess)   Ice Chips Ice chips: Within functional limits Presentation: Spoon   Thin Liquid Thin Liquid: Impaired Presentation: Straw;Cup Pharyngeal  Phase Impairments: Cough - Delayed;Suspected delayed Swallow    Nectar Thick Nectar Thick Liquid: Within functional limits Presentation: Cup;Straw    Honey Thick Honey Thick Liquid: Not tested   Puree Puree: Within functional limits Presentation: Spoon   Solid     Solid: Within functional limits Presentation: Self Fed     Zollie Ellery I. Vear Clock, MS, CCC-SLP Acute Rehabilitation Services Office number [phone] Pager [phone]  Scheryl Marten 09/09/2020,4:53 PM

## 2020-09-10 ENCOUNTER — Other Ambulatory Visit: Payer: Self-pay | Admitting: Nurse Practitioner

## 2020-09-10 ENCOUNTER — Inpatient Hospital Stay (HOSPITAL_COMMUNITY): Payer: Medicare Other

## 2020-09-10 DIAGNOSIS — I152 Hypertension secondary to endocrine disorders: Secondary | ICD-10-CM

## 2020-09-10 DIAGNOSIS — N3000 Acute cystitis without hematuria: Secondary | ICD-10-CM | POA: Diagnosis not present

## 2020-09-10 DIAGNOSIS — E1159 Type 2 diabetes mellitus with other circulatory complications: Secondary | ICD-10-CM

## 2020-09-10 DIAGNOSIS — I6990 Unspecified sequelae of unspecified cerebrovascular disease: Secondary | ICD-10-CM

## 2020-09-10 LAB — GLUCOSE, CAPILLARY
Glucose-Capillary: 179 mg/dL — ABNORMAL HIGH (ref 70–99)
Glucose-Capillary: 235 mg/dL — ABNORMAL HIGH (ref 70–99)
Glucose-Capillary: 200 mg/dL — ABNORMAL HIGH (ref 70–99)
Glucose-Capillary: 190 mg/dL — ABNORMAL HIGH (ref 70–99)
Glucose-Capillary: 200 mg/dL — ABNORMAL HIGH (ref 70–99)

## 2020-09-10 MED ORDER — STARCH (THICKENING) PO POWD
ORAL | Status: DC | PRN
  Filled 2020-09-10: qty 227

## 2020-09-10 NOTE — Progress Notes (Addendum)
Modified Barium Swallow Progress Note  Patient Details  Name: Douglas [name]. MRN: [id number] Date of Birth: [birth year]  Today's Date: 09/10/2020  Modified Barium Swallow completed.  Full report located under Chart Review in the Imaging Section.  Brief recommendations include the following:  Clinical Impression  Pt presented with oropharyngeal dysphagia with impairments similar to those noted during prior studies, but with less impact on safety. He presented with reduced bolus cohesion, a pharyngeal delay, and reduced lingual retraction. He demonstrated vallecular residue, and premature spillage to the valleculae with spillover to the pyriform sinuses. Mild vallecular and pyriform sisnus residue was cleared with secondary swallows. Pt demonstrated aspiration (PAS 7) during intake of a pill with thin liquids via cup when liquids spilled into the airway from the pyriform sinuses. However, no other instances of penetration/aspiration were noted even when pt's swallow was challenged and pt's cough was effective in expelling the aspirate. Pt's diet will be advanced to regular texture solids and thin liquids. SLP will follow briefly to ensure tolerance of the advanced diet.   Swallow Evaluation Recommendations       SLP Diet Recommendations: Regular solids;Thin liquid   Liquid Administration via: Cup;No straw   Medication Administration: Whole meds with puree       Compensations: Slow rate;Small sips/bites   Postural Changes: Seated upright at 90 degrees   Oral Care Recommendations: Oral care BID     Astaria Nanez I. Vear Clock, MS, CCC-SLP Acute Rehabilitation Services Office number [phone] Pager [phone]   Scheryl Marten 09/10/2020,3:00 PM

## 2020-09-10 NOTE — Progress Notes (Signed)
PROGRESS NOTE Douglas Cordias Jr.  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  Admitted for acute metabolic encephalopathy secondary to acute lower UTI and acute kidney injury. Mentation has begun to improve nearer baseline with resolution of AKI and appropriate treatment for his UTI, though he has not returned to his functional baseline. PT recommends SNF for rehabilitation.  Assessment & Plan: Active Problems:   UTI (urinary tract infection)   Encephalopathy  Acute metabolic encephalopathy: Multifactorial due to AKI, UTI, and possibly part of stepwise worsening of vascular dementia. CT head nonacute. EEG 5/10 suggestive of cortical dysfunction centered around left hemisphere affected by stroke. No epileptiform discharges noted. - Delirium precautions to continue, avoid sedating medications if possible.  Dysphagia:  - SLP evaluation appreciated, precautions recommended but no change in diet.  Proteus UTI:  - Continue keflex with plans to complete 7 days Tx. 1 of 4 blood culture bottles with GPC reincubated though favored to be contaminant in this setting. Remains afebrile.   AKI on stage II CKD: SCr 1.84 on admission, back near baseline of 0.8-0.9. Resolved with IVF.  - Can stop IVF now that po intake has improved.    History of CVAs with residual spastic right hemiplegia, HLD:  - Continue DAPT, statin.  T2DM with hyperglycemia:  - Continue SSI.   Orthostatic hypotension:  - Continue midodrine  BPH:  - Continue tamsulosin  Severe protein-calorie malnutrition:  Appears to have decreased muscle bulk, weakness.  - Supplement protein as able.   Seizure disorder:  - Continue lamictal, no seizure noted clinically or by EEG.  DVT prophylaxis: Heparin Code Status: Full Family Communication: Daughter at bedside on PM rounds. Disposition Plan:  Status is: Inpatient  Remains inpatient appropriate because:Unsafe d/c plan  Dispo: The patient is from: Home              Anticipated d/c is to: SNF              Patient currently is medically stable to d/c.   Difficult to place patient No  Consultants:   None  Procedures:   None  Antimicrobials:  Ceftriaxone, keflex   Subjective: Back near baseline as he is interactive though aphasic stating only yes yes yes yes and pointing inconsistently. No overnight events. Tech stated she walked in to help him with breakfast but he was already nearly done eating himself.  Objective: Vitals:   09/09/20 1358 09/09/20 1702 09/09/20 2137 09/10/20 0939  BP: 134/83  135/72 (!) 149/84  Pulse: 81 90 65 89  Resp:   18 18  Temp: 97.7 F (36.5 C)  98.8 F (37.1 C) 98.2 F (36.8 C)  TempSrc: Oral  Oral Oral  SpO2: 100%  100% 99%  Weight:      Height:        Intake/Output Summary (Last 24 hours) at 09/10/2020 1613 Last data filed at 09/10/2020 1109 Gross per 24 hour  Intake 540.45 ml  Output 3200 ml  Net -2659.55 ml   Filed Weights   09/06/20 1409  Weight: 72 kg   Gen: 70 y.o. male in no distress Pulm: Nonlabored breathing room air. Clear.  CV: Regular rate and rhythm. No murmur, rub, or gallop. No JVD, no dependent edema. GI: Abdomen soft, non-tender, non-distended, with normoactive bowel sounds.  Ext: Warm, no deformities Skin: No new rashes, lesions or ulcers on visualized skin. Neuro: Alert, incompletely cooperative with exam, right hemiparesis spasticity noted. No other focal neurological deficits. Psych: UTD   Data Reviewed: I have personally reviewed following labs and imaging  studies  CBC: Recent Labs  Lab 09/06/20 1434 09/07/20 0430  WBC 5.7 6.2  NEUTROABS 3.6  --   HGB 14.7 12.6*  HCT 46.4 39.0  MCV 93.7 92.6  PLT 212 212   Basic Metabolic Panel: Recent Labs  Lab 09/06/20 1434 09/07/20 0430 09/08/20 0230 09/09/20 0221  NA 139 141 137 136  K 4.1 3.9 3.8 3.7  CL 104 104 103 102  CO2 22 30 29 27   GLUCOSE 191* 157* 136* 219*  BUN 37* 27* 13 14  CREATININE 1.84* 1.55* 1.11 1.19  CALCIUM 9.6 9.6 9.5 9.0   GFR: Estimated Creatinine Clearance: 58.8 mL/min (by C-G formula based on SCr of 1.19 mg/dL). Liver Function Tests: No results for input(s): AST, ALT, ALKPHOS, BILITOT, PROT, ALBUMIN in the last 168 hours. No results for input(s): LIPASE, AMYLASE in the last 168 hours. No results for input(s): AMMONIA in the last 168 hours. Coagulation Profile: No results for input(s): INR, PROTIME in the last 168 hours. Cardiac Enzymes: No results for input(s): CKTOTAL, CKMB, CKMBINDEX, TROPONINI in the last 168 hours. BNP (last 3 results) No results for input(s): PROBNP in the last 8760 hours. HbA1C: No results for input(s): HGBA1C in the last 72 hours. CBG: Recent Labs  Lab 09/09/20 2014 09/10/20 0739 09/10/20 0920 09/10/20 1144 09/10/20 1602  GLUCAP 161* 179* 235* 200* 190*   Lipid Profile: No results for input(s): CHOL, HDL, LDLCALC, TRIG, CHOLHDL, LDLDIRECT in the last 72 hours. Thyroid Function Tests: No results for input(s): TSH, T4TOTAL, FREET4, T3FREE, THYROIDAB in the last 72 hours. Anemia Panel: No results for input(s): VITAMINB12, FOLATE, FERRITIN, TIBC, IRON, RETICCTPCT in the last 72 hours. Urine analysis:    Component Value Date/Time   COLORURINE YELLOW 09/06/2020 1452   APPEARANCEUR HAZY (A) 09/06/2020 1452   LABSPEC 1.017 09/06/2020 1452   PHURINE 5.0 09/06/2020 1452   GLUCOSEU NEGATIVE 09/06/2020 1452   HGBUR LARGE (A) 09/06/2020 1452   BILIRUBINUR NEGATIVE 09/06/2020 1452   KETONESUR NEGATIVE 09/06/2020 1452    PROTEINUR NEGATIVE 09/06/2020 1452   NITRITE NEGATIVE 09/06/2020 1452   LEUKOCYTESUR LARGE (A) 09/06/2020 1452   Recent Results (from the past 240 hour(s))  Urine culture     Status: Abnormal   Collection Time: 09/06/20  3:45 PM   Specimen: Urine, Random  Result Value Ref Range Status   Specimen Description URINE, RANDOM  Final   Special Requests   Final    NONE Performed at [facility], [address] [place], [place]    Culture >=100,000 COLONIES/mL PROTEUS MIRABILIS (A)  Final   Report Status 09/08/2020 FINAL  Final   Organism ID, Bacteria PROTEUS MIRABILIS (A)  Final      Susceptibility   Proteus mirabilis - MIC*    AMPICILLIN <=2 SENSITIVE Sensitive     CEFAZOLIN <=4 SENSITIVE Sensitive     CEFEPIME <=0.12 SENSITIVE Sensitive     CEFTRIAXONE <=0.25 SENSITIVE Sensitive     CIPROFLOXACIN <=0.25 SENSITIVE Sensitive     GENTAMICIN <=1 SENSITIVE Sensitive     IMIPENEM 4 SENSITIVE Sensitive  NITROFURANTOIN 128 RESISTANT Resistant     TRIMETH/SULFA <=20 SENSITIVE Sensitive     AMPICILLIN/SULBACTAM <=2 SENSITIVE Sensitive     PIP/TAZO <=4 SENSITIVE Sensitive     * >=100,000 COLONIES/mL PROTEUS MIRABILIS  Culture, blood (routine x 2)     Status: None (Preliminary result)   Collection Time: 09/06/20  4:04 PM   Specimen: BLOOD LEFT FOREARM  Result Value Ref Range Status   Specimen Description BLOOD LEFT FOREARM  Final   Special Requests   Final    BOTTLES DRAWN AEROBIC AND ANAEROBIC Blood Culture results may not be optimal due to an inadequate volume of blood received in culture bottles   Culture  Setup Time   Final    GRAM POSITIVE COCCI IN CLUSTERS AEROBIC BOTTLE ONLY CRITICAL RESULT CALLED TO, READ BACK BY AND VERIFIED WITH: PHARMD DANIEL L. [id number] FCP    Culture   Final    GRAM POSITIVE COCCI CULTURE REINCUBATED FOR BETTER GROWTH Performed at [facility], [address] [place], [place] [address]    Report Status PENDING  Incomplete   Blood Culture ID Panel (Reflexed)     Status: Abnormal   Collection Time: 09/06/20  4:04 PM  Result Value Ref Range Status   Enterococcus faecalis NOT DETECTED NOT DETECTED Final   Enterococcus Faecium NOT DETECTED NOT DETECTED Final   Listeria monocytogenes NOT DETECTED NOT DETECTED Final   Staphylococcus species DETECTED (A) NOT DETECTED Final    Comment: CRITICAL RESULT CALLED TO, READ BACK BY AND VERIFIED WITH: PHARMD DANIEL L. [id number] FCP    Staphylococcus aureus (BCID) NOT DETECTED NOT DETECTED Final   Staphylococcus epidermidis NOT DETECTED NOT DETECTED Final   Staphylococcus lugdunensis NOT DETECTED NOT DETECTED Final   Streptococcus species NOT DETECTED NOT DETECTED Final   Streptococcus agalactiae NOT DETECTED NOT DETECTED Final   Streptococcus pneumoniae NOT DETECTED NOT DETECTED Final   Streptococcus pyogenes NOT DETECTED NOT DETECTED Final   A.calcoaceticus-baumannii NOT DETECTED NOT DETECTED Final   Bacteroides fragilis NOT DETECTED NOT DETECTED Final   Enterobacterales NOT DETECTED NOT DETECTED Final   Enterobacter cloacae complex NOT DETECTED NOT DETECTED Final   Escherichia coli NOT DETECTED NOT DETECTED Final   Klebsiella aerogenes NOT DETECTED NOT DETECTED Final   Klebsiella oxytoca NOT DETECTED NOT DETECTED Final   Klebsiella pneumoniae NOT DETECTED NOT DETECTED Final   Proteus species NOT DETECTED NOT DETECTED Final   Salmonella species NOT DETECTED NOT DETECTED Final   Serratia marcescens NOT DETECTED NOT DETECTED Final   Haemophilus influenzae NOT DETECTED NOT DETECTED Final   Neisseria meningitidis NOT DETECTED NOT DETECTED Final   Pseudomonas aeruginosa NOT DETECTED NOT DETECTED Final   Stenotrophomonas maltophilia NOT DETECTED NOT DETECTED Final   Candida albicans NOT DETECTED NOT DETECTED Final   Candida auris NOT DETECTED NOT DETECTED Final   Candida glabrata NOT DETECTED NOT DETECTED Final   Candida krusei NOT DETECTED NOT DETECTED Final    Candida parapsilosis NOT DETECTED NOT DETECTED Final   Candida tropicalis NOT DETECTED NOT DETECTED Final   Cryptococcus neoformans/gattii NOT DETECTED NOT DETECTED Final    Comment: Performed at [facility], [address] [place], [place] [address]  Culture, blood (routine x 2)     Status: None (Preliminary result)   Collection Time: 09/06/20  4:11 PM   Specimen: BLOOD RIGHT FOREARM  Result Value Ref Range Status   Specimen Description BLOOD RIGHT FOREARM  Final   Special Requests  Final    BOTTLES DRAWN AEROBIC ONLY Blood Culture adequate volume   Culture   Final    NO GROWTH 4 DAYS Performed at [facility], [address] [place], [place] [address]    Report Status PENDING  Incomplete      Radiology Studies: DG Swallowing Func-Speech Pathology  Result Date: 09/10/2020 Objective Swallowing Evaluation: Type of Study: MBS-Modified Barium Swallow Study  Patient Details Name: Douglas Brister. MRN: [id number] Date of Birth: 20-Sep-1950 Today's Date: 09/10/2020 Time: SLP Start Time (ACUTE ONLY): 1342 -SLP Stop Time (ACUTE ONLY): 1357 SLP Time Calculation (min) (ACUTE ONLY): 15 min Past Medical History: Past Medical History: Diagnosis Date . Acute blood loss anemia  . Acute embolic stroke (HCC)  . Acute lower UTI  . AMS (altered mental status) 06/05/2019 . Cerebral edema (HCC) 04/04/2018 . Diabetes 1.5, managed as type 2 (HCC)  . Diabetes mellitus without complication (HCC)  . High cholesterol  . High glucose   Diabetes . History of CVA with residual deficit  . History of Dysphagia, post-stroke  . Hypertension  . Ischemic stroke (HCC) 03/05/2019 . Labile blood glucose  . 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 Renal insufficiency 04/04/2018 . Seizures (HCC)  . Stroke (HCC)  . UTI (urinary tract infection) 03/05/2019 Past Surgical History: Past Surgical History: Procedure Laterality Date . IR PATIENT EVAL TECH 0-60 MINS  03/30/2018 . LOOP RECORDER INSERTION N/A 03/08/2019   Procedure: LOOP RECORDER INSERTION;  Surgeon: Regan Lemming, MD;  Location: [facility] INVASIVE CV LAB;  Service: Cardiovascular;  Laterality: N/A; . TEE WITHOUT CARDIOVERSION N/A 04/02/2018  Procedure: TRANSESOPHAGEAL ECHOCARDIOGRAM (TEE);  Surgeon: [name], MD;  Location: [facility] ENDOSCOPY;  Service: Cardiovascular;  Laterality: N/A; HPI: 70 year old male with medical history significant for but not limited to multiple CVAs with residual right-sided hemiplegia at baseline severe expressive aphasia with receptive impairment, communicates with Y/N and gestures. Pt presented with altered mentation: bizarre behavior and frequent episodes of confusion along with decreased oral intake.  Admitted for acute metabolic encephalopathy secondary to acute lower UTI and acute kidney injury.Pt has a history of dysphagia, last MBS in 12/21 recommended "dys 1, nectar thick liquids with full supervision for use of the following swallow precautions: avoid mixed consistencies, small bites and sips, upright for meals and for 30-60 min after, meds crushed in puree." Daughter reports that pt went to rehab after that admission and by the time he was home he was back on regular diet and thin liquids.  No data recorded Assessment / Plan / Recommendation CHL IP CLINICAL IMPRESSIONS 09/10/2020 Clinical Impression Pt presented with oropharyngeal dysphagia with impairments similar to those noted during prior studies, but with less impact on safety. He presented with reduced bolus cohesion, a pharyngeal delay, and reduced lingual retraction. He demonstrated vallecular residue, and premature spillage to the valleculae with spillover to the pyriform sinuses. Mild vallecular and pyriform sisnus residue was cleared with secondary swallows. Pt demonstrated aspiration (PAS 7) during intake of a pill with thin liquids via cup when liquids spilled into the airway from the pyriform sinuses. However, no other instances of penetration/aspiration were  noted even when pt's swallow was challenged and pt's cough was effective in expelling the aspirate. Pt's diet will be advanced to regular texture solids and thin liquids. SLP will follow briefly to ensure tolerance of the advanced diet. SLP Visit Diagnosis Dysphagia, unspecified (R13.10) Attention and concentration deficit following -- Frontal lobe and executive function deficit following -- Impact  on safety and function Mild aspiration risk;Moderate aspiration risk   CHL IP TREATMENT RECOMMENDATION 09/10/2020 Treatment Recommendations Therapy as outlined in treatment plan below   Prognosis 09/10/2020 Prognosis for Safe Diet Advancement Good Barriers to Reach Goals Cognitive deficits Barriers/Prognosis Comment -- CHL IP DIET RECOMMENDATION 09/10/2020 SLP Diet Recommendations Regular solids;Thin liquid Liquid Administration via Cup;No straw Medication Administration Whole meds with puree Compensations Slow rate;Small sips/bites Postural Changes Seated upright at 90 degrees   CHL IP OTHER RECOMMENDATIONS 09/10/2020 Recommended Consults -- Oral Care Recommendations Oral care BID Other Recommendations --   CHL IP FOLLOW UP RECOMMENDATIONS 09/10/2020 Follow up Recommendations Skilled Nursing facility   [facility] IP FREQUENCY AND DURATION 09/10/2020 Speech Therapy Frequency (ACUTE ONLY) min 2x/week Treatment Duration 1 week      CHL IP ORAL PHASE 09/10/2020 Oral Phase Impaired Oral - Pudding Teaspoon -- Oral - Pudding Cup -- Oral - Honey Teaspoon -- Oral - Honey Cup -- Oral - Nectar Teaspoon -- Oral - Nectar Cup -- Oral - Nectar Straw -- Oral - Thin Teaspoon -- Oral - Thin Cup Decreased bolus cohesion;Premature spillage Oral - Thin Straw Decreased bolus cohesion;Premature spillage Oral - Puree Decreased bolus cohesion;Premature spillage Oral - Mech Soft -- Oral - Regular Decreased bolus cohesion;Premature spillage Oral - Multi-Consistency -- Oral - Pill Decreased bolus cohesion;Premature spillage Oral Phase - Comment --  CHL IP  PHARYNGEAL PHASE 09/10/2020 Pharyngeal Phase Impaired Pharyngeal- Pudding Teaspoon -- Pharyngeal -- Pharyngeal- Pudding Cup -- Pharyngeal -- Pharyngeal- Honey Teaspoon -- Pharyngeal -- Pharyngeal- Honey Cup -- Pharyngeal -- Pharyngeal- Nectar Teaspoon -- Pharyngeal -- Pharyngeal- Nectar Cup -- Pharyngeal -- Pharyngeal- Nectar Straw -- Pharyngeal -- Pharyngeal- Thin Teaspoon -- Pharyngeal -- Pharyngeal- Thin Cup Reduced tongue base retraction;Pharyngeal residue - valleculae;Pharyngeal residue - pyriform;Delayed swallow initiation-pyriform sinuses Pharyngeal -- Pharyngeal- Thin Straw Reduced tongue base retraction;Pharyngeal residue - valleculae;Pharyngeal residue - pyriform;Delayed swallow initiation-pyriform sinuses Pharyngeal -- Pharyngeal- Puree Reduced tongue base retraction;Pharyngeal residue - valleculae;Pharyngeal residue - pyriform;Delayed swallow initiation-pyriform sinuses;Delayed swallow initiation-vallecula Pharyngeal -- Pharyngeal- Mechanical Soft -- Pharyngeal -- Pharyngeal- Regular Reduced tongue base retraction;Pharyngeal residue - valleculae;Pharyngeal residue - pyriform;Delayed swallow initiation-pyriform sinuses Pharyngeal -- Pharyngeal- Multi-consistency -- Pharyngeal -- Pharyngeal- Pill Reduced tongue base retraction;Pharyngeal residue - valleculae;Pharyngeal residue - pyriform;Delayed swallow initiation-pyriform sinuses Pharyngeal -- Pharyngeal Comment --  CHL IP CERVICAL ESOPHAGEAL PHASE 09/10/2020 Cervical Esophageal Phase WFL Pudding Teaspoon -- Pudding Cup -- Honey Teaspoon -- Honey Cup -- Nectar Teaspoon -- Nectar Cup -- Nectar Straw -- Thin Teaspoon -- Thin Cup -- Thin Straw -- Puree -- Mechanical Soft -- Regular -- Multi-consistency -- Pill -- Cervical Esophageal Comment -- Shanika I. Vear Clock, MS, CCC-SLP Acute Rehabilitation Services Office number [phone] Pager [phone] Scheryl Marten 09/10/2020, 3:09 PM              EEG adult  Result Date: 09/08/2020 Charlsie Quest, MD      09/08/2020  6:29 PM Patient Name: Douglas Cordia. MRN: [id number] Epilepsy Attending: Charlsie Quest Referring Physician/Provider: Dr Corinna Lines Date: 09/08/2020 Duration: 25.53 mins Patient history: 70yo m with ams. EEG to evaluate for seizure Level of alertness: Awake,asleep AEDs during EEG study: LTG Technical aspects: This EEG study was done with scalp electrodes positioned according to the 10-20 International system of electrode placement. Electrical activity was acquired at a sampling rate of 500Hz  and reviewed with a high frequency filter of 70Hz  and a low frequency filter of 1Hz . EEG data were recorded continuously and digitally stored. Description: The  posterior dominant rhythm consists of 8-9 Hz activity of moderate voltage (25-35 uV) seen predominantly in posterior head regions, asymmetric ( L<R) and reactive to eye opening and eye closing. Sleep was characterized by vertex waves, sleep spindles (12 to 14 Hz), maximal right frontocentral region.  EEG showed continuous low amplitude 3 to 6 Hz theta-delta slowing in left hemisphere.  Hyperventilation and photic stimulation were not performed.   ABNORMALITY - Continuous slow,  left hemisphere - Background asymmetry, left<right IMPRESSION: This study is suggestive of cortical dysfunction arising from left hemisphere likely secondary to underlying structural abnormality/ stroke. No seizures or epileptiform discharges were seen throughout the recording. Priyanka Annabelle Harman    Scheduled Meds: . aspirin EC  325 mg Oral q morning  . atorvastatin  40 mg Oral Q0600  . baclofen  20 mg Oral BID  . carvedilol  6.25 mg Oral BID WC  . cephALEXin  500 mg Oral Q8H  . clopidogrel  75 mg Oral Daily  . heparin  5,000 Units Subcutaneous Q12H  . insulin aspart  0-9 Units Subcutaneous TID WC  . lamoTRIgine  150 mg Oral BID  . midodrine  2.5 mg Oral BID WC  . tamsulosin  0.4 mg Oral QPC supper  . traZODone  50 mg Oral QHS   Continuous Infusions:   LOS:  4 days   Time spent: 25 minutes.  Tyrone Nine, MD [facility] [email or web address] 09/10/2020, 4:13 PM

## 2020-09-10 NOTE — Progress Notes (Signed)
Physical Therapy Treatment Patient Details Name: Douglas [name]. MRN: [id number] DOB: 11-05-1950 Today's Date: 09/10/2020    History of Present Illness Pt adm 5/8 with AMS. Pt found to have UTI. 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    PT Comments    Patient uses gestures and lipspeaks, however very difficult to understand and this understandably frustrates him. When asked and PT gestured if he wanted to get OOB, he loudly repeated "yes, yes, yes, yes, yes!" These were the only verbalizations that he made.   Patient assisted more getting to EOB than previous session, however despite indicating he wanted to get to chair, he did not assist at all when transfer attempted x 3. Patient returned to supine due to inability to get LE activation (not even trying with his left leg) to assist with transfer bed to chair.     Called pt's daughter and discussed his prior functional status (she could help him stand-pivot and take a few steps). His transfers began to worsen 1 week PTA and he has not returned to his baseline. She agrees with SNF to try to get him more mobile and able to transfer with her before return to home.     Follow Up Recommendations  Supervision/Assistance - 24 hour;SNF (pt had significant decline over 1 week PTA which he has not returned to baseline)     Equipment Recommendations  None recommended by PT    Recommendations for Other Services       Precautions / Restrictions Precautions Precautions: Fall Restrictions Weight Bearing Restrictions: No    Mobility  Bed Mobility Overal bed mobility: Needs Assistance Bed Mobility: Supine to Sit;Sit to Supine     Supine to sit: Mod assist;HOB elevated (with rail) Sit to supine: Total assist   General bed mobility comments: Facilitation/gestures to bring LLE over left EOB, Assist to bring RLE off of the bed. Patient using LUE on rail to assist with elevating  trunk to sitting at EOB--assist to scoot forward to get feet on the floor. Assist to lower trunk and bring legs back into the bed (pt initially actively resisting return to supine after failed attempt at transfer x 3)    Transfers                 General transfer comment: Attempted x 3 with pt not engaging to assist. Pt gestures toward chair as if he wants to sit up, but then has no activation.  Ambulation/Gait                 Stairs             Wheelchair Mobility    Modified Rankin (Stroke Patients Only)       Balance Overall balance assessment: Needs assistance Sitting-balance support: Single extremity supported;Feet supported Sitting balance-Leahy Scale: Poor Sitting balance - Comments: minguard assist for safety with LUE support on rail                                    Cognition Arousal/Alertness: Awake/alert Behavior During Therapy:  (Frustrated by inability to communicate) Overall Cognitive Status: Difficult to assess                                 General Comments: followed gestures and command to  come to sit at EOB; did not initiate transfer with cues and facilitation      Exercises      General Comments        Pertinent Vitals/Pain Pain Assessment: Faces Faces Pain Scale: No hurt    Home Living                      Prior Function    Gait / Transfers Assistance Needed: 5/12 per daughter she could help pt stand-pivot and he would even step his feet before he began to decline 1 week PTA; he had gotten to the point it took "all (her) might" to transfer him.       PT Goals (current goals can now be found in the care plan section) Acute Rehab PT Goals Patient Stated Goal: Pt cannot state Time For Goal Achievement: 09/21/20 Potential to Achieve Goals: Fair Progress towards PT goals: Progressing toward goals    Frequency    Min 2X/week      PT Plan Current plan remains appropriate     Co-evaluation              AM-PAC PT "6 Clicks" Mobility   Outcome Measure  Help needed turning from your back to your side while in a flat bed without using bedrails?: Total Help needed moving from lying on your back to sitting on the side of a flat bed without using bedrails?: Total Help needed moving to and from a bed to a chair (including a wheelchair)?: Total Help needed standing up from a chair using your arms (e.g., wheelchair or bedside chair)?: Total Help needed to walk in hospital room?: Total Help needed climbing 3-5 steps with a railing? : Total 6 Click Score: 6    End of Session Equipment Utilized During Treatment: Gait belt Activity Tolerance: Patient tolerated treatment well Patient left: in bed;with call bell/phone within reach;with bed alarm set Nurse Communication: Mobility status PT Visit Diagnosis: Other abnormalities of gait and mobility (R26.89);Hemiplegia and hemiparesis Hemiplegia - Right/Left: Right Hemiplegia - dominant/non-dominant: Dominant Hemiplegia - caused by: Cerebral infarction     Time: 4315-4008 PT Time Calculation (min) (ACUTE ONLY): 23 min  Charges:  $Therapeutic Activity: 23-37 mins                      Jerolyn Center, PT Pager [phone]    Zena Amos 09/10/2020, 1:56 PM

## 2020-09-10 NOTE — Plan of Care (Signed)

## 2020-09-10 NOTE — TOC Initial Note (Signed)
Transition of Care (TOC) - Initial/Assessment Note    Patient Details  Name: Douglas [name]. MRN: [id number] Date of Birth: 03/02/1951  Transition of Care [facility]) CM/SW Contact:    Beckie Busing, RN Phone Number: 09/10/2020, 9:28 AM  Clinical Narrative:                 TOC following patient for disposition needs. CM spoke with daughter Cala Bradford. Per Cala Bradford patient has been living with her. Daughter is unsure about disposition needs she is currently on her way to meet with PT at the hospital to determine what the patients needs will be when discharged. Daughter states that patient does currently have DME at home (wheelchair, cane &walker) Daughter is agreeable to discuss further needs after meeting with PT. TOC will continue to follow.    Expected Discharge Plan: Home w Home Health Services Barriers to Discharge: Continued Medical Work up   Patient Goals and CMS Choice Patient states their goals for this hospitalization and ongoing recovery are:: Patient unable to state goals   Choice offered to / list presented to :  (spoke with daughter but awaiting her meeting with PT this am)  Expected Discharge Plan and Services Expected Discharge Plan: Home w Home Health Services In-house Referral: NA Discharge Planning Services: CM Consult   Living arrangements for the past 2 months: Single Family Home                 DME Arranged: N/A DME Agency: NA         HH Agency: NA        Prior Living Arrangements/Services Living arrangements for the past 2 months: Single Family Home Lives with:: Adult Children   Do you feel safe going back to the place where you live?:  (patient is unable to answer)      Need for Family Participation in Patient Care: Yes (Comment) Care giver support system in place?: Yes (comment)   Criminal Activity/Legal Involvement Pertinent to Current Situation/Hospitalization: No - Comment as needed  Activities of Daily Living      Permission  Sought/Granted   Permission granted to share information with : No              Emotional Assessment Appearance::  (unable to assess) Attitude/Demeanor/Rapport: Unable to Assess Affect (typically observed): Unable to Assess     Psych Involvement: No (comment)  Admission diagnosis:  Encephalopathy [G93.40] AKI (acute kidney injury) (HCC) [N17.9] Urinary tract infection with hematuria, site unspecified [N39.0, R31.9] Altered mental status, unspecified altered mental status type [R41.82] Patient Active Problem List   Diagnosis Date Noted  . AKI (acute kidney injury) (HCC)   . Decubitus ulcer of sacral region, stage 2 (HCC) 04/27/2020  . Protein-calorie malnutrition, severe 04/23/2020  . Physical debility 04/22/2020  . Hyperlipidemia 04/20/2020  . Slow transit constipation 12/26/2019  . Seizure (HCC) 11/20/2019  . Seizures (HCC) 11/20/2019  . Encephalopathy 11/19/2019  . Delayed orthostatic hypotension 08/04/2019  . Seizure disorder (HCC) 07/21/2019  . 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 Shoulder subluxation, right, sequela 07/16/2019  . Altered mental status 07/15/2019  . 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 Late effects of CVA (cerebrovascular accident)   . Pressure injury of skin 06/08/2019  . History of loop recorder 03/08/2019  . UTI (urinary tract infection) 03/05/2019  . 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 Global aphasia   . Type 2  diabetes mellitus with peripheral neuropathy (HCC)   . Hypertension associated with diabetes (HCC)   . Hyperlipidemia associated with type 2 diabetes mellitus (HCC)   . History of CVA (cerebrovascular accident)   . History of Dysphagia, post-stroke    PCP:  Ngetich, Donalee Citrin, NP Pharmacy:   [facility] [phone] - [place], [place] - [address] AT [address] OF [facility] [address] [place] [address] Phone:  [phone] Fax: [phone]     Social Determinants of Health (SDOH) Interventions    Readmission Risk Interventions Readmission Risk Prevention Plan 09/10/2020  Transportation Screening Complete  PCP or Specialist Appt within 5-7 Days Complete  Home Care Screening Complete  Medication Review (RN CM) Referral to Pharmacy

## 2020-09-11 DIAGNOSIS — N3000 Acute cystitis without hematuria: Secondary | ICD-10-CM | POA: Diagnosis not present

## 2020-09-11 LAB — SARS CORONAVIRUS 2 (TAT 6-24 HRS): SARS Coronavirus 2: NEGATIVE

## 2020-09-11 LAB — CULTURE, BLOOD (ROUTINE X 2)
Culture: NO GROWTH
Special Requests: ADEQUATE

## 2020-09-11 LAB — GLUCOSE, CAPILLARY
Glucose-Capillary: 136 mg/dL — ABNORMAL HIGH (ref 70–99)
Glucose-Capillary: 167 mg/dL — ABNORMAL HIGH (ref 70–99)
Glucose-Capillary: 194 mg/dL — ABNORMAL HIGH (ref 70–99)

## 2020-09-11 MED ORDER — CEPHALEXIN 500 MG PO CAPS: 500.0000 mg | ORAL_CAPSULE | Freq: Three times a day (TID) | ORAL | 0 refills | Status: DC

## 2020-09-11 NOTE — TOC Progression Note (Addendum)
Transition of Care (TOC) - Progression Note    Patient Details  Name: Douglas [name]. MRN: [id number] Date of Birth: 1951-01-23  Transition of Care [facility]) CM/SW Contact  Beckie Busing, RN Phone Number: [phone]  09/11/2020, 9:47 AM  Clinical Narrative:    CM spoke with daughter Cala Bradford who is now agreeable to SNF placement. Daughter currently not at facility but agrees to [facility] leaving a list of choices a the bedside.  CM will initiate SNF workup.  1044 FL2 completed and info has been faxed out for bed offers. No insurance auth needed patient is straight medicare.   1215 Bed offers received. CM has reached out to daughter and daughter not yet ready to decide she wants to review list first. Will be coming in. CM has requested Covid test for placement.  Expected Discharge Plan: Home w Home Health Services Barriers to Discharge: Continued Medical Work up  Expected Discharge Plan and Services Expected Discharge Plan: Home w Home Health Services In-house Referral: NA Discharge Planning Services: CM Consult   Living arrangements for the past 2 months: Single Family Home Expected Discharge Date: 09/11/20               DME Arranged: N/A DME Agency: NA         HH Agency: NA         Social Determinants of Health (SDOH) Interventions    Readmission Risk Interventions Readmission Risk Prevention Plan 09/10/2020  Transportation Screening Complete  PCP or Specialist Appt within 5-7 Days Complete  Home Care Screening Complete  Medication Review (RN CM) Referral to Pharmacy

## 2020-09-11 NOTE — Discharge Summary (Signed)
Physician Discharge Summary  Douglas [name]. LTR:[id number] DOB: 04-03-1951 DOA: 09/06/2020  PCP: Caesar Bookman, NP  Admit date: 09/06/2020 Discharge date: 09/11/2020  Admitted From: Home Disposition: SNF   Recommendations for Outpatient Follow-up:  1. 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 Admitted for acute metabolic encephalopathy secondary to acute lower UTI and acute kidney injury.  Mentation has begun to improve nearer baseline with resolution of AKI and appropriate treatment for his UTI, though he has not returned to his functional baseline. PT recommends SNF for rehabilitation which is being pursued.  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 CT head nonacute. EEG 5/10 suggestive of cortical dysfunction centered around left hemisphere affected by stroke. No epileptiform discharges noted. - Delirium precautions, avoid sedating medications.   Dysphagia:  - SLP evaluation appreciated, precautions recommended but no change in diet.  Proteus UTI:  - Continue keflex with plans to complete 7 days Tx (last dose will be in the evening of 5/14). 1 of 4 blood culture bottles with S. auricularis, S. capitis favored to be contaminant in this setting. Remains afebrile.   AKI on stage II CKD: SCr 1.84 on  admission, back near baseline of 0.8-0.9. Resolved with IVF.  - Avoid nephrotoxins, monitor intermittently.  History of CVAs with residual spastic right hemiplegia, HLD:  - Continue DAPT, statin.  T2DM with hyperglycemia: HbA1c 6.8%.  - Continue home medications as planned  HLD:  - Continue statin  Orthostatic hypotension, HTN:  - Continue midodrine, coreg.  BPH:  - Continue tamsulosin  Severe protein-calorie malnutrition: Appears to have decreased muscle bulk, weakness.  - Supplement protein as able.   Seizure disorder:  - Continue lamictal, no seizure noted clinically or by EEG.  Discharge Instructions  Allergies as of 09/11/2020      Reactions   Ciprofloxacin Anaphylaxis   Keppra [levetiracetam] Other (See Comments)   Makes the patient very agitated and "not like himself"   Avocado Nausea And Vomiting   Metformin And Related Other (See Comments)   "Renal issue"      Medication List    TAKE these medications   amLODipine 10 MG tablet Commonly known as: NORVASC Take 1 tablet (10 mg total) by mouth daily.   ascorbic acid 500 MG tablet Commonly known as: VITAMIN C Take 1 tablet (500 mg total) by mouth daily as needed (if cold-like symptoms are present).   aspirin EC 325 MG tablet Take 325 mg by mouth every morning.   atorvastatin 40 MG tablet Commonly known as: LIPITOR Take 1 tablet (40 mg total) by mouth daily at 6 (six) AM.   baclofen 20 MG tablet Commonly known as: LIORESAL TAKE 1 TABLET BY MOUTH TWICE A DAY   carvedilol 12.5 MG tablet Commonly known as: COREG Take 1 tablet (12.5 mg total) by mouth 2 (two) times daily with a meal. WILL NEED APPT FOR FUTURE REFILLS   cephALEXin 500 MG capsule Commonly  known as: KEFLEX Take 1 capsule (500 mg total) by mouth every 8 (eight) hours for 2 days.   ciclopirox 8 % solution Commonly known as: Penlac Apply topically at bedtime. Apply over nail and surrounding skin. Apply daily over previous coat. After  seven (7) days, may remove with alcohol and continue cycle. What changed: how much to take   clopidogrel 75 MG tablet Commonly known as: PLAVIX Take 1 tablet (75 mg total) by mouth daily.   glipiZIDE 10 MG tablet Commonly known as: GLUCOTROL Take 1 tablet (10 mg total) by mouth 2 (two) times daily with a meal. Needs an appointment before anymore future refills.   indapamide 1.25 MG tablet Commonly known as: LOZOL Take 1 tablet (1.25 mg total) by mouth daily. Needs an appointment before anymore future refill.   insulin detemir 100 UNIT/ML injection Commonly known as: LEVEMIR Inject 0.05 mLs (5 Units total) into the skin daily. If CBG >125 (per daughter)   ketoconazole 2 % cream Commonly known as: NIZORAL Apply 1 application topically daily.   lamoTRIgine 150 MG tablet Commonly known as: LAMICTAL TAKE 1 TABLET BY MOUTH TWICE A DAY   linagliptin 5 MG Tabs tablet Commonly known as: TRADJENTA Take 1 tablet (5 mg total) by mouth daily.   liver oil-zinc oxide 40 % ointment Commonly known as: DESITIN Apply 1 application topically 2 (two) times daily as needed for irritation.   midodrine 5 MG tablet Commonly known as: PROAMATINE Take one tablet by mouth in the morning, at noon, and at bedtime for orthostatic Hypotension. Needs an appointment before anymore future refills. What changed:   how much to take  how to take this  when to take this  additional instructions   NovoLOG FlexPen 100 UNIT/ML FlexPen Generic drug: insulin aspart INJECT 2-15 UNITS INTO THE SKIN 4 TIMES DAILY - BEFORE MEALS AND AT BEDTIME. SSI: 121-150 = 2 UNITS 151-200 = 3 UNITS, 201-250 = 5 UNITS, 251-300 = 8 UNITS, 301-350 = 11 UNITS, 351-300 = 15 UNITS, GREATER THAN 400 = 15 UNITS AND CALL MD What changed: See the new instructions.   senna-docusate 8.6-50 MG tablet Commonly known as: Senokot-S Take 1 tablet by mouth at bedtime as needed for mild constipation.   tamsulosin 0.4 MG Caps  capsule Commonly known as: FLOMAX Take 1 capsule (0.4 mg total) by mouth daily after supper.   traZODone 50 MG tablet Commonly known as: DESYREL TAKE 1/2-1 TABLET BY MOUTH AT BEDTIME AS NEEDED FOR SLEEP. What changed: See the new instructions.   Vitamin D3 25 MCG tablet Commonly known as: Vitamin D Take 0.5 tablets (500 Units total) by mouth daily.       Follow-up Information    Ngetich, Dinah C, NP. Schedule an appointment as soon as possible for a visit.   Specialty: Family Medicine Contact information: [address] [place] [address] [phone]              Allergies  Allergen Reactions  . Ciprofloxacin Anaphylaxis  . Keppra [Levetiracetam] Other (See Comments)    Makes the patient very agitated and "not like himself"  . Avocado Nausea And Vomiting  . Metformin And Related Other (See Comments)    "Renal issue"    Consultations:  None  Procedures/Studies: CT Head Wo Contrast  Result Date: 09/06/2020 CLINICAL DATA:  Altered mental status. EXAM: CT HEAD WITHOUT CONTRAST TECHNIQUE: Contiguous axial images were obtained from the base of the skull through the vertex without intravenous contrast. COMPARISON:  August 09, 2020 FINDINGS: Brain: There is mild cerebral atrophy with widening of the extra-axial spaces and ventricular dilatation. There are areas of decreased attenuation within the white matter tracts of the supratentorial brain, consistent with microvascular disease changes. A large area of cortical encephalomalacia and adjacent chronic white matter low attenuation is seen throughout the left hemisphere, involving the left MCA and left PCA territories. Associated ex vacuo dilatation of the left lateral ventricle is seen. Vascular: No hyperdense vessel or unexpected calcification. Skull: Normal. Negative for fracture or focal lesion. Sinuses/Orbits: No acute finding. Other: None. IMPRESSION: 1. Generalized cerebral atrophy. 2. Large chronic left MCA and left PCA  territory infarct. 3. No acute intracranial abnormality. Electronically Signed   By: Aram Candela M.D.   On: 09/06/2020 16:56   DG Pelvis Portable  Result Date: 08/27/2020 CLINICAL DATA:  Right leg pain. EXAM: PORTABLE PELVIS 1-2 VIEWS COMPARISON:  CT abdomen pelvis dated November 19, 2019. FINDINGS: No acute fracture or dislocation. Mild bilateral hip osteoarthritis. Osteopenia. Large stool ball in the rectum. IMPRESSION: 1. No acute osseous abnormality. Electronically Signed   By: Obie Dredge M.D.   On: 08/27/2020 13:56   DG Chest Port 1 View  Result Date: 09/06/2020 CLINICAL DATA:  Altered mental status EXAM: PORTABLE CHEST 1 VIEW COMPARISON:  08/09/2020 FINDINGS: Cardiac shadows within normal limits. Loop recorder is again noted. The lungs are clear. Previously seen nodular density in the right base is well seen but extrinsic to the chest wall consistent with prior nipple shadow. No focal infiltrate is seen. No bony noted. IMPRESSION: No acute abnormality seen. Previously noted nodule corresponds to a nipple shadow extrinsic to the chest wall. Electronically Signed   By: Alcide Clever M.D.   On: 09/06/2020 17:32   DG Swallowing Func-Speech Pathology  Result Date: 09/10/2020 Objective Swallowing Evaluation: Type of Study: MBS-Modified Barium Swallow Study  Patient Details Name: Douglas [name]. MRN: [id number] Date of Birth: [birth year] Today's Date: 09/10/2020 Time: SLP Start Time (ACUTE ONLY): 1342 -SLP Stop Time (ACUTE ONLY): 1357 SLP Time Calculation (min) (ACUTE ONLY): 15 min Past Medical History: Past Medical History: Diagnosis Date . Acute blood loss anemia  . Acute embolic stroke (HCC)  . Acute lower UTI  . AMS (altered mental status) 06/05/2019 . Cerebral edema (HCC) 04/04/2018 . Diabetes 1.5, managed as type 2 (HCC)  . Diabetes mellitus without complication (HCC)  . High cholesterol  . High glucose   Diabetes . History of CVA with residual deficit  . History of Dysphagia, post-stroke  .  Hypertension  . Ischemic stroke (HCC) 03/05/2019 . Labile blood glucose  . 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 Renal insufficiency 04/04/2018 . Seizures (HCC)  . Stroke (HCC)  . UTI (urinary tract infection) 03/05/2019 Past Surgical History: Past Surgical History: Procedure Laterality Date . IR PATIENT EVAL TECH 0-60 MINS  03/30/2018 . LOOP RECORDER INSERTION N/A 03/08/2019  Procedure: LOOP RECORDER INSERTION;  Surgeon: Regan Lemming, MD;  Location: [facility] INVASIVE [facility];  Service: Cardiovascular;  Laterality: N/A; . TEE WITHOUT CARDIOVERSION N/A 04/02/2018  Procedure: TRANSESOPHAGEAL ECHOCARDIOGRAM (TEE);  Surgeon: [name], MD;  Location: [facility] ENDOSCOPY;  Service: Cardiovascular;  Laterality: N/A; HPI: 70 year old male with medical history significant for but not limited to multiple CVAs with residual right-sided hemiplegia at baseline severe expressive aphasia with receptive impairment, communicates with Y/N and gestures. Pt presented with altered mentation: bizarre behavior and frequent episodes of confusion along with decreased oral intake.  Admitted for acute  metabolic encephalopathy secondary to acute lower UTI and acute kidney injury.Pt has a history of dysphagia, last MBS in 12/21 recommended "dys 1, nectar thick liquids with full supervision for use of the following swallow precautions: avoid mixed consistencies, small bites and sips, upright for meals and for 30-60 min after, meds crushed in puree." Daughter reports that pt went to rehab after that admission and by the time he was home he was back on regular diet and thin liquids.  No data recorded Assessment / Plan / Recommendation CHL IP CLINICAL IMPRESSIONS 09/10/2020 Clinical Impression Pt presented with oropharyngeal dysphagia with impairments similar to those noted during prior studies, but with less impact on safety. He presented with reduced bolus cohesion, a pharyngeal delay, and reduced lingual retraction. He demonstrated  vallecular residue, and premature spillage to the valleculae with spillover to the pyriform sinuses. Mild vallecular and pyriform sisnus residue was cleared with secondary swallows. Pt demonstrated aspiration (PAS 7) during intake of a pill with thin liquids via cup when liquids spilled into the airway from the pyriform sinuses. However, no other instances of penetration/aspiration were noted even when pt's swallow was challenged and pt's cough was effective in expelling the aspirate. Pt's diet will be advanced to regular texture solids and thin liquids. SLP will follow briefly to ensure tolerance of the advanced diet. SLP Visit Diagnosis Dysphagia, unspecified (R13.10) Attention and concentration deficit following -- Frontal lobe and executive function deficit following -- Impact on safety and function Mild aspiration risk;Moderate aspiration risk   CHL IP TREATMENT RECOMMENDATION 09/10/2020 Treatment Recommendations Therapy as outlined in treatment plan below   Prognosis 09/10/2020 Prognosis for Safe Diet Advancement Good Barriers to Reach Goals Cognitive deficits Barriers/Prognosis Comment -- CHL IP DIET RECOMMENDATION 09/10/2020 SLP Diet Recommendations Regular solids;Thin liquid Liquid Administration via Cup;No straw Medication Administration Whole meds with puree Compensations Slow rate;Small sips/bites Postural Changes Seated upright at 90 degrees   CHL IP OTHER RECOMMENDATIONS 09/10/2020 Recommended Consults -- Oral Care Recommendations Oral care BID Other Recommendations --   CHL IP FOLLOW UP RECOMMENDATIONS 09/10/2020 Follow up Recommendations Skilled Nursing facility   [facility] IP FREQUENCY AND DURATION 09/10/2020 Speech Therapy Frequency (ACUTE ONLY) min 2x/week Treatment Duration 1 week      CHL IP ORAL PHASE 09/10/2020 Oral Phase Impaired Oral - Pudding Teaspoon -- Oral - Pudding Cup -- Oral - Honey Teaspoon -- Oral - Honey Cup -- Oral - Nectar Teaspoon -- Oral - Nectar Cup -- Oral - Nectar Straw -- Oral - Thin  Teaspoon -- Oral - Thin Cup Decreased bolus cohesion;Premature spillage Oral - Thin Straw Decreased bolus cohesion;Premature spillage Oral - Puree Decreased bolus cohesion;Premature spillage Oral - Mech Soft -- Oral - Regular Decreased bolus cohesion;Premature spillage Oral - Multi-Consistency -- Oral - Pill Decreased bolus cohesion;Premature spillage Oral Phase - Comment --  CHL IP PHARYNGEAL PHASE 09/10/2020 Pharyngeal Phase Impaired Pharyngeal- Pudding Teaspoon -- Pharyngeal -- Pharyngeal- Pudding Cup -- Pharyngeal -- Pharyngeal- Honey Teaspoon -- Pharyngeal -- Pharyngeal- Honey Cup -- Pharyngeal -- Pharyngeal- Nectar Teaspoon -- Pharyngeal -- Pharyngeal- Nectar Cup -- Pharyngeal -- Pharyngeal- Nectar Straw -- Pharyngeal -- Pharyngeal- Thin Teaspoon -- Pharyngeal -- Pharyngeal- Thin Cup Reduced tongue base retraction;Pharyngeal residue - valleculae;Pharyngeal residue - pyriform;Delayed swallow initiation-pyriform sinuses Pharyngeal -- Pharyngeal- Thin Straw Reduced tongue base retraction;Pharyngeal residue - valleculae;Pharyngeal residue - pyriform;Delayed swallow initiation-pyriform sinuses Pharyngeal -- Pharyngeal- Puree Reduced tongue base retraction;Pharyngeal residue - valleculae;Pharyngeal residue - pyriform;Delayed swallow initiation-pyriform sinuses;Delayed swallow initiation-vallecula Pharyngeal -- Pharyngeal- Mechanical Soft --  Pharyngeal -- Pharyngeal- Regular Reduced tongue base retraction;Pharyngeal residue - valleculae;Pharyngeal residue - pyriform;Delayed swallow initiation-pyriform sinuses Pharyngeal -- Pharyngeal- Multi-consistency -- Pharyngeal -- Pharyngeal- Pill Reduced tongue base retraction;Pharyngeal residue - valleculae;Pharyngeal residue - pyriform;Delayed swallow initiation-pyriform sinuses Pharyngeal -- Pharyngeal Comment --  CHL IP CERVICAL ESOPHAGEAL PHASE 09/10/2020 Cervical Esophageal Phase WFL Pudding Teaspoon -- Pudding Cup -- Honey Teaspoon -- Honey Cup -- Nectar Teaspoon -- Nectar  Cup -- Nectar Straw -- Thin Teaspoon -- Thin Cup -- Thin Straw -- Puree -- Mechanical Soft -- Regular -- Multi-consistency -- Pill -- Cervical Esophageal Comment -- Shanika I. Vear Clock, MS, CCC-SLP Acute Rehabilitation Services Office number [phone] Pager [phone] Scheryl Marten 09/10/2020, 3:09 PM              EEG adult  Result Date: 09/08/2020 Charlsie Quest, MD     09/08/2020  6:29 PM Patient Name: Douglas [name]. MRN: [id number] Epilepsy Attending: Charlsie Quest Referring Physician/Provider: Dr Corinna Lines Date: 09/08/2020 Duration: 25.53 mins Patient history: 70yo m with ams. EEG to evaluate for seizure Level of alertness: Awake,asleep AEDs during EEG study: LTG Technical aspects: This EEG study was done with scalp electrodes positioned according to the 10-20 International system of electrode placement. Electrical activity was acquired at a sampling rate of 500Hz  and reviewed with a high frequency filter of 70Hz  and a low frequency filter of 1Hz . EEG data were recorded continuously and digitally stored. Description: The posterior dominant rhythm consists of 8-9 Hz activity of moderate voltage (25-35 uV) seen predominantly in posterior head regions, asymmetric ( L<R) and reactive to eye opening and eye closing. Sleep was characterized by vertex waves, sleep spindles (12 to 14 Hz), maximal right frontocentral region.  EEG showed continuous low amplitude 3 to 6 Hz theta-delta slowing in left hemisphere.  Hyperventilation and photic stimulation were not performed.   ABNORMALITY - Continuous slow,  left hemisphere - Background asymmetry, left<right IMPRESSION: This study is suggestive of cortical dysfunction arising from left hemisphere likely secondary to underlying structural abnormality/ stroke. No seizures or epileptiform discharges were seen throughout the recording. Priyanka O Yadav   VAS LOWER EXTREMITY VENOUS (DVT) ([facility] 7a-7p)  Result Date: 08/29/2020  Lower Venous DVT  Study Patient Name:  Rockne Dearinger.  Date of Exam:   08/27/2020 Medical Rec #: 08/31/2020             Accession #:    Douglas [name] Date of Birth: 1951/02/11             Patient Gender: M Patient Age:   70Y Exam Location:  [facility] Procedure:      VAS 06/02/1950 LOWER EXTREMITY VENOUS (DVT) Referring Phys: 09-20-1996 [facility] --------------------------------------------------------------------------------  Indications: Right leg pain.  Risk Factors: Stroke. Comparison Study: 02-20-19 - Negative Performing Technologist: [id number] [place] RDMS, RVT  Examination Guidelines: A complete evaluation includes B-mode imaging, spectral Doppler, color Doppler, and power Doppler as needed of all accessible portions of each vessel. Bilateral testing is considered an integral part of a complete examination. Limited examinations for reoccurring indications may be performed as noted. The reflux portion of the exam is performed with the patient in reverse Trendelenburg.  +---------+---------------+---------+-----------+----------+--------------+ RIGHT    CompressibilityPhasicitySpontaneityPropertiesThrombus Aging +---------+---------------+---------+-----------+----------+--------------+ CFV      Full           Yes      Yes                                 +---------+---------------+---------+-----------+----------+--------------+  SFJ      Full                                                        +---------+---------------+---------+-----------+----------+--------------+ FV Prox  Full                                                        +---------+---------------+---------+-----------+----------+--------------+ FV Mid   Full                                                        +---------+---------------+---------+-----------+----------+--------------+ FV DistalFull                                                         +---------+---------------+---------+-----------+----------+--------------+ PFV      Full                                                        +---------+---------------+---------+-----------+----------+--------------+ POP      Full           Yes      Yes                                 +---------+---------------+---------+-----------+----------+--------------+ PTV      Full                                                        +---------+---------------+---------+-----------+----------+--------------+ PERO     Full                                                        +---------+---------------+---------+-----------+----------+--------------+   +---------+---------------+---------+-----------+----------+--------------+ LEFT     CompressibilityPhasicitySpontaneityPropertiesThrombus Aging +---------+---------------+---------+-----------+----------+--------------+ CFV      Full           Yes      Yes                                 +---------+---------------+---------+-----------+----------+--------------+ SFJ      Full                                                        +---------+---------------+---------+-----------+----------+--------------+  FV Prox  Full                                                        +---------+---------------+---------+-----------+----------+--------------+ FV Mid   Full                                                        +---------+---------------+---------+-----------+----------+--------------+ FV DistalFull                                                        +---------+---------------+---------+-----------+----------+--------------+ PFV      Full                                                        +---------+---------------+---------+-----------+----------+--------------+ POP      Full           Yes      Yes                                  +---------+---------------+---------+-----------+----------+--------------+ PTV      Full                                                        +---------+---------------+---------+-----------+----------+--------------+ PERO     Full                                                        +---------+---------------+---------+-----------+----------+--------------+     Summary: BILATERAL: - No evidence of deep vein thrombosis seen in the lower extremities, bilaterally. -No evidence of popliteal cyst, bilaterally.   *See table(s) above for measurements and observations. Electronically signed by Coral Else MD on 08/29/2020 at 4:18:50 PM.    Final      Subjective: Remains more alert with improved, baseline po intake. No events overnight.   Discharge Exam: Vitals:   09/10/20 2047 09/11/20 0957  BP: (!) 153/93 134/70  Pulse: 79 72  Resp: 18   Temp: 98.7 F (37.1 C)   SpO2: 100%    General: Pt is alert, awake, not in acute distress Cardiovascular: RRR, S1/S2 +, no rubs, no gallops Respiratory: CTA bilaterally, no wheezing, no rhonchi Abdominal: Soft, NT, ND, bowel sounds + Extremities: No pitting edema, no cyanosis  Labs: BNP (last 3 results) No results for input(s): BNP in the last 8760 hours. Basic Metabolic Panel: Recent Labs  Lab 09/06/20 1434 09/07/20 0430 09/08/20 0230 09/09/20 0221  NA 139 141  137 136  K 4.1 3.9 3.8 3.7  CL 104 104 103 102  CO2 GLUCOSE 191* 157* 136* 219*  BUN 37* 27* 13 14  CREATININE 1.84* 1.55* 1.11 1.19  CALCIUM 9.6 9.6 9.5 9.0   Liver Function Tests: No results for input(s): AST, ALT, ALKPHOS, BILITOT, PROT, ALBUMIN in the last 168 hours. No results for input(s): LIPASE, AMYLASE in the last 168 hours. No results for input(s): AMMONIA in the last 168 hours. CBC: Recent Labs  Lab 09/06/20 1434 09/07/20 0430  WBC 5.7 6.2  NEUTROABS 3.6  --   HGB 14.7 12.6*  HCT 46.4 39.0  MCV 93.7 92.6  PLT 212 212   Cardiac  Enzymes: No results for input(s): CKTOTAL, CKMB, CKMBINDEX, TROPONINI in the last 168 hours. BNP: Invalid input(s): POCBNP CBG: Recent Labs  Lab 09/10/20 0920 09/10/20 1144 09/10/20 1602 09/10/20 1922 09/11/20 0749  GLUCAP 235* 200* 190* 200* 136*   D-Dimer No results for input(s): DDIMER in the last 72 hours. Hgb A1c No results for input(s): HGBA1C in the last 72 hours. Lipid Profile No results for input(s): CHOL, HDL, LDLCALC, TRIG, CHOLHDL, LDLDIRECT in the last 72 hours. Thyroid function studies No results for input(s): TSH, T4TOTAL, T3FREE, THYROIDAB in the last 72 hours.  Invalid input(s): FREET3 Anemia work up No results for input(s): VITAMINB12, FOLATE, FERRITIN, TIBC, IRON, RETICCTPCT in the last 72 hours. Urinalysis    Component Value Date/Time   COLORURINE YELLOW 09/06/2020 1452   APPEARANCEUR HAZY (A) 09/06/2020 1452   LABSPEC 1.017 09/06/2020 1452   PHURINE 5.0 09/06/2020 1452   GLUCOSEU NEGATIVE 09/06/2020 1452   HGBUR LARGE (A) 09/06/2020 1452   BILIRUBINUR NEGATIVE 09/06/2020 1452   KETONESUR NEGATIVE 09/06/2020 1452   PROTEINUR NEGATIVE 09/06/2020 1452   NITRITE NEGATIVE 09/06/2020 1452   LEUKOCYTESUR LARGE (A) 09/06/2020 1452    Microbiology Recent Results (from the past 240 hour(s))  Urine culture     Status: Abnormal   Collection Time: 09/06/20  3:45 PM   Specimen: Urine, Random  Result Value Ref Range Status   Specimen Description URINE, RANDOM  Final   Special Requests   Final    NONE Performed at [facility], [address], [place], [place] [address]    Culture >=100,000 COLONIES/mL PROTEUS MIRABILIS (A)  Final   Report Status 09/08/2020 FINAL  Final   Organism ID, Bacteria PROTEUS MIRABILIS (A)  Final      Susceptibility   Proteus mirabilis - MIC*    AMPICILLIN <=2 SENSITIVE Sensitive     CEFAZOLIN <=4 SENSITIVE Sensitive     CEFEPIME <=0.12 SENSITIVE Sensitive     CEFTRIAXONE <=0.25 SENSITIVE Sensitive     CIPROFLOXACIN  <=0.25 SENSITIVE Sensitive     GENTAMICIN <=1 SENSITIVE Sensitive     IMIPENEM 4 SENSITIVE Sensitive     NITROFURANTOIN 128 RESISTANT Resistant     TRIMETH/SULFA <=20 SENSITIVE Sensitive     AMPICILLIN/SULBACTAM <=2 SENSITIVE Sensitive     PIP/TAZO <=4 SENSITIVE Sensitive     * >=100,000 COLONIES/mL PROTEUS MIRABILIS  Culture, blood (routine x 2)     Status: Abnormal   Collection Time: 09/06/20  4:04 PM   Specimen: BLOOD LEFT FOREARM  Result Value Ref Range Status   Specimen Description BLOOD LEFT FOREARM  Final   Special Requests   Final    BOTTLES DRAWN AEROBIC AND ANAEROBIC Blood Culture results may not be optimal due to an inadequate volume of blood received  in culture bottles   Culture  Setup Time   Final    GRAM POSITIVE COCCI IN CLUSTERS AEROBIC BOTTLE ONLY CRITICAL RESULT CALLED TO, READ BACK BY AND VERIFIED WITH: PHARMD DANIEL L. [id number] FCP    Culture (A)  Final    STAPHYLOCOCCUS AURICULARIS STAPHYLOCOCCUS CAPITIS THE SIGNIFICANCE OF ISOLATING THIS ORGANISM FROM A SINGLE SET OF BLOOD CULTURES WHEN MULTIPLE SETS ARE DRAWN IS UNCERTAIN. PLEASE NOTIFY THE MICROBIOLOGY DEPARTMENT WITHIN ONE WEEK IF SPECIATION AND SENSITIVITIES ARE REQUIRED. Performed at [facility], [address] [place], [place] [address]    Report Status 09/11/2020 FINAL  Final  Blood Culture ID Panel (Reflexed)     Status: Abnormal   Collection Time: 09/06/20  4:04 PM  Result Value Ref Range Status   Enterococcus faecalis NOT DETECTED NOT DETECTED Final   Enterococcus Faecium NOT DETECTED NOT DETECTED Final   Listeria monocytogenes NOT DETECTED NOT DETECTED Final   Staphylococcus species DETECTED (A) NOT DETECTED Final    Comment: CRITICAL RESULT CALLED TO, READ BACK BY AND VERIFIED WITH: PHARMD DANIEL L. [id number] FCP    Staphylococcus aureus (BCID) NOT DETECTED NOT DETECTED Final   Staphylococcus epidermidis NOT DETECTED NOT DETECTED Final   Staphylococcus lugdunensis NOT DETECTED NOT  DETECTED Final   Streptococcus species NOT DETECTED NOT DETECTED Final   Streptococcus agalactiae NOT DETECTED NOT DETECTED Final   Streptococcus pneumoniae NOT DETECTED NOT DETECTED Final   Streptococcus pyogenes NOT DETECTED NOT DETECTED Final   A.calcoaceticus-baumannii NOT DETECTED NOT DETECTED Final   Bacteroides fragilis NOT DETECTED NOT DETECTED Final   Enterobacterales NOT DETECTED NOT DETECTED Final   Enterobacter cloacae complex NOT DETECTED NOT DETECTED Final   Escherichia coli NOT DETECTED NOT DETECTED Final   Klebsiella aerogenes NOT DETECTED NOT DETECTED Final   Klebsiella oxytoca NOT DETECTED NOT DETECTED Final   Klebsiella pneumoniae NOT DETECTED NOT DETECTED Final   Proteus species NOT DETECTED NOT DETECTED Final   Salmonella species NOT DETECTED NOT DETECTED Final   Serratia marcescens NOT DETECTED NOT DETECTED Final   Haemophilus influenzae NOT DETECTED NOT DETECTED Final   Neisseria meningitidis NOT DETECTED NOT DETECTED Final   Pseudomonas aeruginosa NOT DETECTED NOT DETECTED Final   Stenotrophomonas maltophilia NOT DETECTED NOT DETECTED Final   Candida albicans NOT DETECTED NOT DETECTED Final   Candida auris NOT DETECTED NOT DETECTED Final   Candida glabrata NOT DETECTED NOT DETECTED Final   Candida krusei NOT DETECTED NOT DETECTED Final   Candida parapsilosis NOT DETECTED NOT DETECTED Final   Candida tropicalis NOT DETECTED NOT DETECTED Final   Cryptococcus neoformans/gattii NOT DETECTED NOT DETECTED Final    Comment: Performed at [facility], [address] [place], [place] [address]  Culture, blood (routine x 2)     Status: None (Preliminary result)   Collection Time: 09/06/20  4:11 PM   Specimen: BLOOD RIGHT FOREARM  Result Value Ref Range Status   Specimen Description BLOOD RIGHT FOREARM  Final   Special Requests   Final    BOTTLES DRAWN AEROBIC ONLY Blood Culture adequate volume   Culture   Final    NO GROWTH 4 DAYS Performed at [facility], [address] [place], [place] [address]    Report Status PENDING  Incomplete    Time coordinating discharge: Approximately 40 minutes  Tyrone Nine, MD  [facility] 09/11/2020, 12:18 PM

## 2020-09-11 NOTE — NC FL2 (Addendum)
[place] MEDICAID FL2 LEVEL OF CARE SCREENING TOOL     IDENTIFICATION  Patient Name: Douglas [name]. Birthdate: [birth year] Sex: male Admission Date (Current Location): 09/06/2020  [facility] and [place] Number:  Producer, television/film/video and Address:  [facility], [address], [place], [place] [address]      Provider Number: [id number]  Attending Physician Name and Address:  Tyrone Nine, MD  Relative Name and Phone Number:  Aahan Marques [phone]    Current Level of Care: Hospital Recommended Level of Care: Skilled Nursing Facility Prior Approval Number:    Date Approved/Denied:   PASRR Number:  [id number]  Discharge Plan: SNF    Current Diagnoses: Patient Active Problem List   Diagnosis Date Noted  . AKI (acute kidney injury) (HCC)   . Decubitus ulcer of sacral region, stage 2 (HCC) 04/27/2020  . Protein-calorie malnutrition, severe 04/23/2020  . Physical debility 04/22/2020  . Hyperlipidemia 04/20/2020  . Slow transit constipation 12/26/2019  . Seizure (HCC) 11/20/2019  . Seizures (HCC) 11/20/2019  . Encephalopathy 11/19/2019  . Delayed orthostatic hypotension 08/04/2019  . Seizure disorder (HCC) 07/21/2019  . 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 Shoulder subluxation, right, sequela 07/16/2019  . Altered mental status 07/15/2019  . 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 Late effects of CVA (cerebrovascular accident)   . Pressure injury of skin 06/08/2019  . History of loop recorder 03/08/2019  . UTI (urinary tract infection) 03/05/2019  . 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 Global aphasia   . Type 2 diabetes mellitus with peripheral neuropathy (HCC)   . Hypertension associated with diabetes (HCC)   . Hyperlipidemia associated with type 2 diabetes mellitus (HCC)   . History of CVA (cerebrovascular accident)   . History of Dysphagia,  post-stroke     Orientation RESPIRATION BLADDER Height & Weight     Self  Normal Incontinent,External catheter Weight: 72 kg Height:  6\' 1"  (185.4 cm)  BEHAVIORAL SYMPTOMS/MOOD NEUROLOGICAL BOWEL NUTRITION STATUS    Convulsions/Seizures (Hx of seizures) Incontinent Diet (Heart Healthy/ carb modified)  AMBULATORY STATUS COMMUNICATION OF NEEDS Skin   Total Care Non-Verbally (gestures) Normal                       Personal Care Assistance Level of Assistance  Bathing,Feeding,Dressing,Total care Bathing Assistance: Maximum assistance Feeding assistance: Maximum assistance Dressing Assistance: Maximum assistance Total Care Assistance: Maximum assistance   Functional Limitations Info  Sight,Hearing,Speech Sight Info: Adequate Hearing Info: Adequate Speech Info: Impaired (slurred/ dysarthria)    SPECIAL CARE FACTORS FREQUENCY  PT (By licensed PT),OT (By licensed OT),Speech therapy     PT Frequency: 5X OT Frequency: 5X     Speech Therapy Frequency: follow up to ensure patient is tolerating diet      Contractures Contractures Info: Present (right hand)    Additional Factors Info  Code Status,Allergies,Psychotropic,Insulin Sliding Scale,Isolation Precautions,Suctioning Needs Code Status Info: Full Allergies Info: Ciprofloxacin, Keppra (Levetiracetam), Avocado, Metformin And Related Psychotropic Info: see discharge summary Insulin Sliding Scale Info: see discharge summary for sliding scale info Isolation Precautions Info: n/a Suctioning Needs: n/a   Current Medications (09/11/2020):  This is the current hospital active medication list Current Facility-Administered Medications  Medication Dose Route Frequency Provider Last Rate Last Admin  . aspirin EC tablet 325 mg  325 mg Oral q morning 09/13/2020, MD   325  mg at 09/11/20 1003  . atorvastatin (LIPITOR) tablet 40 mg  40 mg Oral Q0600 Mikey College T, MD   40 mg at 09/11/20 0551  . baclofen (LIORESAL) tablet 20 mg  20  mg Oral BID Mikey College T, MD   20 mg at 09/11/20 0957  . carvedilol (COREG) tablet 6.25 mg  6.25 mg Oral BID WC Mikey College T, MD   6.25 mg at 09/11/20 0957  . cephALEXin (KEFLEX) capsule 500 mg  500 mg Oral Q8H Pham, Minh Q, RPH-CPP   500 mg at 09/11/20 0552  . clopidogrel (PLAVIX) tablet 75 mg  75 mg Oral Daily Mikey College T, MD   75 mg at 09/11/20 0957  . heparin injection 5,000 Units  5,000 Units Subcutaneous Q12H Emeline General, MD   5,000 Units at 09/11/20 [phone]  . insulin aspart (novoLOG) injection 0-9 Units  0-9 Units Subcutaneous TID WC Emeline General, MD   1 Units at 09/11/20 [phone]  . lamoTRIgine (LAMICTAL) tablet 150 mg  150 mg Oral BID Mikey College T, MD   150 mg at 09/11/20 0957  . midodrine (PROAMATINE) tablet 2.5 mg  2.5 mg Oral BID WC Mikey College T, MD   2.5 mg at 09/11/20 0957  . ondansetron (ZOFRAN) tablet 4 mg  4 mg Oral Q6H PRN Mikey College T, MD       Or  . ondansetron [facility]) injection 4 mg  4 mg Intravenous Q6H PRN Mikey College T, MD      . tamsulosin [facility]) capsule 0.4 mg  0.4 mg Oral QPC supper Tyrone Nine, MD   0.4 mg at 09/10/20 1656  . traZODone (DESYREL) tablet 50 mg  50 mg Oral QHS Emeline General, MD   50 mg at 09/10/20 2050     Discharge Medications: Please see discharge summary for a list of discharge medications.  Relevant Imaging Results:  Relevant Lab Results:   Additional Information SS# [id number]  Beckie Busing, RN

## 2020-09-11 NOTE — Plan of Care (Signed)
  Problem: Education: Goal: Knowledge of General Education information will improve Description: Including pain rating scale, medication(s)/side effects and non-pharmacologic comfort measures Outcome: Progressing   Problem: Health Behavior/Discharge Planning: Goal: Ability to manage health-related needs will improve Outcome: Progressing   Problem: Clinical Measurements: Goal: Ability to maintain clinical measurements within normal limits will improve Outcome: Progressing Goal: Diagnostic test results will improve Outcome: Progressing   Problem: Activity: Goal: Risk for activity intolerance will decrease Outcome: Progressing   Problem: Nutrition: Goal: Adequate nutrition will be maintained Outcome: Progressing   Problem: Coping: Goal: Level of anxiety will decrease Outcome: Progressing   

## 2020-09-12 DIAGNOSIS — N3000 Acute cystitis without hematuria: Secondary | ICD-10-CM | POA: Diagnosis not present

## 2020-09-12 LAB — GLUCOSE, CAPILLARY
Glucose-Capillary: 188 mg/dL — ABNORMAL HIGH (ref 70–99)
Glucose-Capillary: 188 mg/dL — ABNORMAL HIGH (ref 70–99)
Glucose-Capillary: 223 mg/dL — ABNORMAL HIGH (ref 70–99)
Glucose-Capillary: 218 mg/dL — ABNORMAL HIGH (ref 70–99)

## 2020-09-12 MED ORDER — LACTATED RINGERS IV BOLUS
1000.0000 mL | Freq: Once | INTRAVENOUS | Status: AC
  Administered 2020-09-12: 1000 mL via INTRAVENOUS

## 2020-09-12 NOTE — Progress Notes (Signed)
Notified by tech of low BP.  Assessed patient, decreased activity and manual pressure of 72/46.  HR 67.   Notified MD and orders received.

## 2020-09-12 NOTE — Plan of Care (Signed)
  Problem: Education: Goal: Knowledge of General Education information will improve Description Including pain rating scale, medication(s)/side effects and non-pharmacologic comfort measures Outcome: Progressing   Problem: Health Behavior/Discharge Planning: Goal: Ability to manage health-related needs will improve Outcome: Progressing   Problem: Clinical Measurements: Goal: Ability to maintain clinical measurements within normal limits will improve Outcome: Progressing Goal: Will remain free from infection Outcome: Progressing Goal: Diagnostic test results will improve Outcome: Progressing   Problem: Activity: Goal: Risk for activity intolerance will decrease Outcome: Progressing   Problem: Safety: Goal: Ability to remain free from injury will improve Outcome: Progressing   

## 2020-09-12 NOTE — TOC Progression Note (Addendum)
Transition of Care (TOC) - Progression Note    Patient Details  Name: Douglas [name]. MRN: [id number] Date of Birth: 04-Jun-1950  Transition of Care [facility]) CM/SW Contact  Carley Hammed, [place] Phone Number: 09/12/2020, 4:25 PM  Clinical Narrative:    CSW spoke with pt's dtr and gave bed offers, pt's dtr stated she would call back w/ bed choice. She was made aware that pt is medically stable w/ the only barrier being bed choice.  4:30 CSW had not had a return call at this time from dtr, CSW followed up. Pt's dtr stated she would like [facility], [facility], or [place]. She was advised they had not offered a bed. She stated that she would call these facilities and see if they would extend bed offers. She stated she would follow up in the morning. CSW entered avoidable day. SW will continue to follow for DC placement.   Expected Discharge Plan: Home w Home Health Services Barriers to Discharge: Continued Medical Work up  Expected Discharge Plan and Services Expected Discharge Plan: Home w Home Health Services In-house Referral: NA Discharge Planning Services: CM Consult   Living arrangements for the past 2 months: Single Family Home Expected Discharge Date: 09/11/20               DME Arranged: N/A DME Agency: NA         HH Agency: NA         Social Determinants of Health (SDOH) Interventions    Readmission Risk Interventions Readmission Risk Prevention Plan 09/10/2020  Transportation Screening Complete  PCP or Specialist Appt within 5-7 Days Complete  Home Care Screening Complete  Medication Review (RN CM) Referral to Pharmacy

## 2020-09-12 NOTE — Progress Notes (Signed)
Patient discharged yesterday to SNF. Unclear why he remains in the hospital, though he has had stable vital signs overnight. I have seen and examined him again this morning. He is without complaints at his baseline level of interactivity. Makes eye contact, makes gestures with left hand, says yes yes yes and occasionally no without correlation to conversation.   BP (!) 164/85   Pulse 86   Temp (!) 97.3 F (36.3 C) (Oral)   Resp 18   Ht 6\' 1"  (1.854 m)   Wt 72 kg   SpO2 99%   BMI 20.94 kg/m   No distress RRR, no edema Alert, not appreciably verbal, right hemiparesis stable  Screening covid test yesterday was negative.   Patient remains stable for transfer to SNF with plan as outlined in DC summary from 09/11/2020. I spoke with his daughter in person yesterday afternoon.  09/13/2020, MD 09/12/2020 10:45 AM

## 2020-09-13 DIAGNOSIS — N3000 Acute cystitis without hematuria: Secondary | ICD-10-CM | POA: Diagnosis not present

## 2020-09-13 LAB — GLUCOSE, CAPILLARY
Glucose-Capillary: 178 mg/dL — ABNORMAL HIGH (ref 70–99)
Glucose-Capillary: 175 mg/dL — ABNORMAL HIGH (ref 70–99)
Glucose-Capillary: 204 mg/dL — ABNORMAL HIGH (ref 70–99)

## 2020-09-13 NOTE — Progress Notes (Signed)
Patient still pending DC to SNF. Yesterday had midodrine held due to high BP only to then have lower BP which improved with midodrine. Was also given IV fluids but is eating full meals. His interactions with me are unchanged today, sleeping quietly but awakens and point with left arm, responding "yes yes yes" to any question.   BP 124/64   Pulse 78   Temp 98.4 F (36.9 C) (Oral)   Resp 16   Ht 6\' 1"  (1.854 m)   Wt 72 kg   SpO2 98%   BMI 20.94 kg/m   Awakens easily, appears in no distress. RRR, no M/R/G, no edema. Clear lungs.  No deformities.  Stable R facial droop, right hemiparesis with no changes.  A/P:  Has completed abx for proteus UTI. Mentation is stable. Remains stable for discharge from a medical perspective, and continue to feel that SNF is appropriate disposition.   , MD 09/13/2020 11:38 AM

## 2020-09-13 NOTE — TOC Progression Note (Addendum)
Transition of Care (TOC) - Progression Note    Patient Details  Name: Dameian Crisman. MRN: [id number] Date of Birth: [birth year]  Transition of Care [facility]) CM/SW Contact  Lawerance Sabal, RN Phone Number: 09/13/2020, 3:12 PM  Clinical Narrative:   Informed through CSW that patient is out of covered days for SNF stay, he is now in copay days  Spoke w daughter [name], agreeable to home health services instead of paying copays out of pocket.  Notified Dr Jarvis Newcomer, and requested [facility] orders.  Discussed HH providers, she would like to use [facility]. Referral sent to liaison, awaiting confirmation. -Accepted- Declined DME needs. Requested medical transport for after 5:30 as she states she is on her way back home from a trip and she will be home by then.  Transport forms given to unit clerk, PTAR called for 6pm pick up.    Expected Discharge Plan: Home w Home Health Services Barriers to Discharge: Continued Medical Work up  Expected Discharge Plan and Services Expected Discharge Plan: Home w Home Health Services In-house Referral: NA Discharge Planning Services: CM Consult   Living arrangements for the past 2 months: Single Family Home Expected Discharge Date: 09/11/20               DME Arranged: N/A DME Agency: NA         HH Agency: NA         Social Determinants of Health (SDOH) Interventions    Readmission Risk Interventions Readmission Risk Prevention Plan 09/10/2020  Transportation Screening Complete  PCP or Specialist Appt within 5-7 Days Complete  Home Care Screening Complete  Medication Review (RN CM) Referral to Pharmacy

## 2020-09-13 NOTE — Progress Notes (Signed)
Removed PIV and condom catheter.  Completed AVS medications and follow up appointments and placed in DC folder with other paperwork for daughter at home. Gathered all belongings. Waiting for PTAR to arrive to take patient home.

## 2020-09-13 NOTE — TOC Progression Note (Addendum)
Transition of Care (TOC) - Progression Note    Patient Details  Name: Douglas [name]. MRN: [id number] Date of Birth: 1951-02-12  Transition of Care [facility]) CM/SW Contact  Carley Hammed, [place] Phone Number: 09/13/2020, 9:50 AM  Clinical Narrative:     CSW contacted pt's dtr to determine a bed choice. She was advised that pt has been medically cleared and DC'ed. Pt has several bed offers and a decision needs to be made. Dtr continued to request facilities that had not offered. She noted that Previous TOC had helped her by contacting facilitites, CSW has contacted facilities, but many do not have admissions on the weekend. Dtr requested to speak with previous TOC, she was advised that CSW was the covering TOC at this time, and she was not available. Also that this information was coming from leadership, and that she would get the same information from other TOC's.  CSW offered to go through and discuss the options to help her make the best choice, HH options were also offered. Dtr not interested. CSW offered to have CM contact her to discuss, CM will call with assistance of CSW to discuss best options for pt. If this does not work, CSW will Chartered certified accountant for next steps.  10:30am. CM spoke to pt's dtr and reiterated bed choices and the need to choose a bed. Pt's dtr is unhappy with this information and with CSW and CM, however agreed to go over bed offers again. CM noted she would put CSW on the call to discuss SNF options, but pt's dtr hung up. CM and CSW both attempted to call back, VM were left. CSW followed up with leadership who noted that at this time it may not be possible to DC today anyway with most SNF's having no admissions. Leadership stated MD could attempt to reach out, if contact cannot be made.   11:00am. CSW received call from Emerson, Pt's dtr, who would like to discuss pro's and con's with the different SNF's. CSW went over the options and helped her narrow decision down to  [facility] or [facility], Sheliah Hatch is reviewing now. Dtr will attempt to speak with [facility] and info was given to [facility] liaison to follow up w/ dtr as well. CSW will follow for Bed choice.  2:30 pm. CSW had not received call from dtr yet, so phone call was made. [name] returned call and noted that she is still waiting for [facility] to call her back, but she liked them better. CSW had spoken to [facility] and they had noted that pt is into copay days. They require copays up front at time of admission. CSW reached out to [facility] who noted copays would be about 190 a day. CSW gave pt's dtr this information, and she stated that she would rather take him home. He has been set up with [facility] before, but she does not want them again. She said [facility] sounds familiar. CM will follow up for [facility] needs.  Expected Discharge Plan: Home w Home Health Services Barriers to Discharge: Continued Medical Work up  Expected Discharge Plan and Services Expected Discharge Plan: Home w Home Health Services In-house Referral: NA Discharge Planning Services: CM Consult   Living arrangements for the past 2 months: Single Family Home Expected Discharge Date: 09/11/20               DME Arranged: N/A DME Agency: NA         HH Agency: NA  Social Determinants of Health (SDOH) Interventions    Readmission Risk Interventions Readmission Risk Prevention Plan 09/10/2020  Transportation Screening Complete  PCP or Specialist Appt within 5-7 Days Complete  Home Care Screening Complete  Medication Review (RN CM) Referral to Pharmacy

## 2020-09-14 LAB — CUP PACEART REMOTE DEVICE CHECK
Date Time Interrogation Session: 20220514230519
Implantable Pulse Generator Implant Date: 20201106

## 2020-09-15 ENCOUNTER — Telehealth: Payer: Self-pay | Admitting: *Deleted

## 2020-09-15 NOTE — Telephone Encounter (Signed)
I have made the 1st attempt to contact the patient or family member in charge, in order to follow up from recently being discharged from the hospital. I left a message on voicemail but I will make another attempt at a different time.  

## 2020-09-18 ENCOUNTER — Other Ambulatory Visit: Payer: Self-pay | Admitting: Family

## 2020-09-18 DIAGNOSIS — I951 Orthostatic hypotension: Secondary | ICD-10-CM

## 2020-09-18 DIAGNOSIS — E1142 Type 2 diabetes mellitus with diabetic polyneuropathy: Secondary | ICD-10-CM

## 2020-09-23 ENCOUNTER — Other Ambulatory Visit: Payer: Self-pay | Admitting: Family

## 2020-09-23 DIAGNOSIS — E1159 Type 2 diabetes mellitus with other circulatory complications: Secondary | ICD-10-CM

## 2020-09-25 NOTE — Progress Notes (Signed)
Carelink Summary Report / Loop Recorder 

## 2020-10-01 ENCOUNTER — Emergency Department (HOSPITAL_COMMUNITY): Payer: Medicare Other

## 2020-10-01 ENCOUNTER — Encounter (HOSPITAL_COMMUNITY): Payer: Self-pay | Admitting: Emergency Medicine

## 2020-10-01 ENCOUNTER — Emergency Department (HOSPITAL_COMMUNITY)
Admission: EM | Admit: 2020-10-01 | Discharge: 2020-10-01 | Disposition: A | Discharge status: Home / Self Care | Payer: Medicare Other | Attending: Emergency Medicine | Admitting: Emergency Medicine

## 2020-10-01 ENCOUNTER — Other Ambulatory Visit: Payer: Self-pay

## 2020-10-01 DIAGNOSIS — Z7982 Long term (current) use of aspirin: Secondary | ICD-10-CM | POA: Insufficient documentation

## 2020-10-01 DIAGNOSIS — Z7984 Long term (current) use of oral hypoglycemic drugs: Secondary | ICD-10-CM | POA: Diagnosis not present

## 2020-10-01 DIAGNOSIS — R456 Violent behavior: Secondary | ICD-10-CM | POA: Insufficient documentation

## 2020-10-01 DIAGNOSIS — Z794 Long term (current) use of insulin: Secondary | ICD-10-CM | POA: Insufficient documentation

## 2020-10-01 DIAGNOSIS — R4182 Altered mental status, unspecified: Secondary | ICD-10-CM | POA: Diagnosis not present

## 2020-10-01 DIAGNOSIS — R451 Restlessness and agitation: Secondary | ICD-10-CM

## 2020-10-01 DIAGNOSIS — I129 Hypertensive chronic kidney disease with stage 1 through stage 4 chronic kidney disease, or unspecified chronic kidney disease: Secondary | ICD-10-CM | POA: Diagnosis not present

## 2020-10-01 DIAGNOSIS — N182 Chronic kidney disease, stage 2 (mild): Secondary | ICD-10-CM | POA: Insufficient documentation

## 2020-10-01 DIAGNOSIS — E1122 Type 2 diabetes mellitus with diabetic chronic kidney disease: Secondary | ICD-10-CM | POA: Insufficient documentation

## 2020-10-01 DIAGNOSIS — Z79899 Other long term (current) drug therapy: Secondary | ICD-10-CM | POA: Insufficient documentation

## 2020-10-01 DIAGNOSIS — Z20822 Contact with and (suspected) exposure to covid-19: Secondary | ICD-10-CM | POA: Insufficient documentation

## 2020-10-01 LAB — CBC WITH DIFFERENTIAL/PLATELET
Abs Immature Granulocytes: 0.02 10*3/uL (ref 0.00–0.07)
Basophils Absolute: 0 10*3/uL (ref 0.0–0.1)
Basophils Relative: 1 %
Eosinophils Absolute: 0.1 10*3/uL (ref 0.0–0.5)
Eosinophils Relative: 1 %
HCT: 42.3 % (ref 39.0–52.0)
Hemoglobin: 13.4 g/dL (ref 13.0–17.0)
Immature Granulocytes: 0 %
Lymphocytes Relative: 23 %
Lymphs Abs: 1.3 10*3/uL (ref 0.7–4.0)
MCH: 29.5 pg (ref 26.0–34.0)
MCHC: 31.7 g/dL (ref 30.0–36.0)
MCV: 93 fL (ref 80.0–100.0)
Monocytes Absolute: 0.3 10*3/uL (ref 0.1–1.0)
Monocytes Relative: 6 %
Neutro Abs: 3.8 10*3/uL (ref 1.7–7.7)
Neutrophils Relative %: 69 %
Platelets: 250 10*3/uL (ref 150–400)
RBC: 4.55 MIL/uL (ref 4.22–5.81)
RDW: 14.4 % (ref 11.5–15.5)
WBC: 5.6 10*3/uL (ref 4.0–10.5)
nRBC: 0 % (ref 0.0–0.2)

## 2020-10-01 LAB — URINALYSIS, ROUTINE W REFLEX MICROSCOPIC
Bilirubin Urine: NEGATIVE
Glucose, UA: NEGATIVE mg/dL
Hgb urine dipstick: NEGATIVE
Ketones, ur: NEGATIVE mg/dL
Leukocytes,Ua: NEGATIVE
Nitrite: NEGATIVE
Protein, ur: NEGATIVE mg/dL
Specific Gravity, Urine: 1.015 (ref 1.005–1.030)
pH: 5 (ref 5.0–8.0)

## 2020-10-01 LAB — LACTIC ACID, PLASMA: Lactic Acid, Venous: 1.6 mmol/L (ref 0.5–1.9)

## 2020-10-01 LAB — COMPREHENSIVE METABOLIC PANEL
ALT: 34 U/L (ref 0–44)
AST: 25 U/L (ref 15–41)
Albumin: 4.3 g/dL (ref 3.5–5.0)
Alkaline Phosphatase: 82 U/L (ref 38–126)
Anion gap: 9 (ref 5–15)
BUN: 22 mg/dL (ref 8–23)
CO2: 29 mmol/L (ref 22–32)
Calcium: 10 mg/dL (ref 8.9–10.3)
Chloride: 103 mmol/L (ref 98–111)
Creatinine, Ser: 1.2 mg/dL (ref 0.61–1.24)
GFR, Estimated: >60 mL/min (ref >60)
Glucose, Bld: 143 mg/dL — ABNORMAL HIGH (ref 70–99)
Potassium: 3.2 mmol/L — ABNORMAL LOW (ref 3.5–5.1)
Sodium: 141 mmol/L (ref 135–145)
Total Bilirubin: 1.5 mg/dL — ABNORMAL HIGH (ref 0.3–1.2)
Total Protein: 7.3 g/dL (ref 6.5–8.1)

## 2020-10-01 LAB — RESP PANEL BY RT-PCR (FLU A&B, COVID) ARPGX2
Influenza A by PCR: NEGATIVE
Influenza B by PCR: NEGATIVE
SARS Coronavirus 2 by RT PCR: NEGATIVE

## 2020-10-01 LAB — PROTIME-INR
INR: 1.2 (ref 0.8–1.2)
Prothrombin Time: 14.7 seconds (ref 11.4–15.2)

## 2020-10-01 LAB — APTT: aPTT: 28 seconds (ref 24–36)

## 2020-10-01 MED ORDER — LAMOTRIGINE 150 MG PO TABS
150.0000 mg | ORAL_TABLET | ORAL | Status: AC
  Administered 2020-10-01: 150 mg via ORAL
  Filled 2020-10-01: qty 1

## 2020-10-01 MED ORDER — RISPERIDONE 0.5 MG PO TABS
0.5000 mg | ORAL_TABLET | Freq: Once | ORAL | Status: DC
  Filled 2020-10-01: qty 1

## 2020-10-01 MED ORDER — LORAZEPAM 2 MG/ML IJ SOLN
1.0000 mg | Freq: Once | INTRAMUSCULAR | Status: AC
  Administered 2020-10-01: 1 mg via INTRAMUSCULAR
  Filled 2020-10-01: qty 1

## 2020-10-01 MED ORDER — POTASSIUM CHLORIDE CRYS ER 20 MEQ PO TBCR
20.0000 meq | EXTENDED_RELEASE_TABLET | Freq: Once | ORAL | Status: DC
  Filled 2020-10-01: qty 1

## 2020-10-01 MED ORDER — RISPERIDONE 2 MG PO TABS: 2.0000 mg | ORAL_TABLET | Freq: Every day | ORAL | 0 refills | Status: DC

## 2020-10-01 NOTE — ED Notes (Signed)
This RN called dtr to inform her pt is being discharged and to ensure she is home to receive the pt if he comes by [facility]. Dtr will ensure someone is home. Secretary calling PTAR at this time

## 2020-10-01 NOTE — Discharge Instructions (Addendum)
Please give medicines with applesauce You are being given a referral for palliative care medicine and transitions of care is working with you for skilled nursing facility placement as an outpatient

## 2020-10-01 NOTE — ED Provider Notes (Signed)
[facility]   CSN: [id number] Arrival date & time: 10/01/20  1028     History No chief complaint on file.   Douglas CordiaWilliam L Raybuck Jr. is a 70 y.o. male.  HPI  Level 5 caveat secondary to history of aphasia, altered mental status history of stroke Patient is noncontributory to history here in ED. History obtained from EMS who spoke with daughter at the house 70 year old male admitted 5 8 through [id number] for metabolic encephalopathy with a history of multiple strokes, residual right-sided hemiplegia with contractures, baseline nonverbal and nonambulatory, type 2 diabetes, hypertension, hyperlipidemia, seizure disorder, stage II CKD with eventual discharge diagnosis of UTI encephalopathy.  It was discharged to home although there was recommendation for skilled nursing facility.  Per EMS report patient has been home with daughter but today has refused to take any medications.  Patient's family also reported that he has not been eating and drinking since yesterday.  The report has been more aggressive than normal today.  He responded yes to everything they ask him with no other verbalization.  He has bed bound at baseline.  His blood pressure was 156/82, heart rate 74, oxygen saturation 94% and CBG 114 reported prehospital.     Past Medical History:  Diagnosis Date  . Acute blood loss anemia   . Acute embolic stroke (HCC)   . Acute lower UTI   . AMS (altered mental status) 06/05/2019  . Cerebral edema (HCC) 04/04/2018  . Diabetes 1.5, managed as type 2 (HCC)   . Diabetes mellitus without complication (HCC)   . High cholesterol   . High glucose    Diabetes  . History of CVA with residual deficit   . History of Dysphagia, post-stroke   . Hypertension   . Ischemic stroke (HCC) 03/05/2019  . Labile blood glucose   . 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 Renal insufficiency 04/04/2018  . Seizures (HCC)   . Stroke (HCC)   . UTI  (urinary tract infection) 03/05/2019    Patient Active Problem List   Diagnosis Date Noted  . AKI (acute kidney injury) (HCC)   . Decubitus ulcer of sacral region, stage 2 (HCC) 04/27/2020  . Protein-calorie malnutrition, severe 04/23/2020  . Physical debility 04/22/2020  . Hyperlipidemia 04/20/2020  . Slow transit constipation 12/26/2019  . Seizure (HCC) 11/20/2019  . Seizures (HCC) 11/20/2019  . Encephalopathy 11/19/2019  . Delayed orthostatic hypotension 08/04/2019  . Seizure disorder (HCC) 07/21/2019  . 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 Shoulder subluxation, right, sequela 07/16/2019  . Altered mental status 07/15/2019  . 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 Late effects of CVA (cerebrovascular accident)   . Pressure injury of skin 06/08/2019  . History of loop recorder 03/08/2019  . UTI (urinary tract infection) 03/05/2019  . 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 Global aphasia   . Type 2 diabetes mellitus with peripheral neuropathy (HCC)   . Hypertension associated with diabetes (HCC)   . Hyperlipidemia associated with type 2 diabetes mellitus (HCC)   . History of CVA (cerebrovascular accident)   . History of Dysphagia, post-stroke     Past Surgical History:  Procedure Laterality Date  . IR PATIENT EVAL TECH 0-60 MINS  03/30/2018  . LOOP RECORDER INSERTION N/A 03/08/2019   Procedure: LOOP RECORDER INSERTION;  Surgeon: Regan Lemmingamnitz, Will Martin, MD;  Location: [facility] INVASIVE CV LAB;  Service: Cardiovascular;  Laterality: N/A;  . TEE WITHOUT CARDIOVERSION N/A 04/02/2018   Procedure: TRANSESOPHAGEAL ECHOCARDIOGRAM (TEE);  Surgeon: [name], MD;  Location: [facility] ENDOSCOPY;  Service: Cardiovascular;  Laterality: N/A;       Family History  Problem Relation Age of Onset  . Leukemia Mother   . Dementia Father     Social History   Tobacco Use  . Smoking status: Never Smoker   . Smokeless tobacco: Never Used  Vaping Use  . Vaping Use: Never used  Substance Use Topics  . Alcohol use: Not Currently  . Drug use: Never    Home Medications Prior to Admission medications   Medication Sig Start Date End Date Taking? Authorizing Provider  amLODipine (NORVASC) 10 MG tablet Take 1 tablet (10 mg total) by mouth daily. 03/06/20   Ngetich, Dinah C, NP  ascorbic acid (VITAMIN C) 500 MG tablet Take 1 tablet (500 mg total) by mouth daily as needed (if cold-like symptoms are present). 08/30/19   Margit Hanks, MD  aspirin EC 325 MG tablet Take 325 mg by mouth every morning.    [provider]  atorvastatin (LIPITOR) 40 MG tablet Take 1 tablet (40 mg total) by mouth daily at 6 (six) AM. 12/26/19   Ngetich, Dinah C, NP  baclofen (LIORESAL) 20 MG tablet TAKE 1 TABLET BY MOUTH TWICE A DAY Patient taking differently: Take 20 mg by mouth 2 (two) times daily. 06/01/20   Ngetich, Dinah C, NP  carvedilol (COREG) 12.5 MG tablet Take 1 tablet (12.5 mg total) by mouth 2 (two) times daily with a meal. WILL NEED APPT FOR FUTURE REFILLS 08/28/20   Sharon Seller, NP  ciclopirox (PENLAC) 8 % solution Apply topically at bedtime. Apply over nail and surrounding skin. Apply daily over previous coat. After seven (7) days, may remove with alcohol and continue cycle. Patient taking differently: Apply 1 application topically at bedtime. Apply over nail and surrounding skin. Apply daily over previous coat. After seven (7) days, may remove with alcohol and continue cycle. 06/01/20   Edwin Cap, DPM  clopidogrel (PLAVIX) 75 MG tablet Take 1 tablet (75 mg total) by mouth daily. 12/26/19   Ngetich, Dinah C, NP  glipiZIDE (GLUCOTROL) 10 MG tablet TAKE 1 TABLET TWICE A DAY WITH A MEAL. NEED APPT 09/18/20   Ngetich, Dinah C, NP  indapamide (LOZOL) 1.25 MG tablet TAKE 1 TABLET BY MOUTH DAILY. NEEDS AN APPOINTMENT BEFORE ANYMORE FUTURE REFILL. 09/23/20   Ngetich, Dinah C, NP  insulin detemir  (LEVEMIR) 100 UNIT/ML injection Inject 0.05 mLs (5 Units total) into the skin daily. If CBG >125 (per daughter) 04/27/20   Lewie Chamber, MD  ketoconazole (NIZORAL) 2 % cream Apply 1 application topically daily. 06/01/20   McDonald, Rachelle Hora, DPM  lamoTRIgine (LAMICTAL) 150 MG tablet TAKE 1 TABLET BY MOUTH TWICE A DAY Patient taking differently: Take 150 mg by mouth 2 (two) times daily. 06/01/20   Ngetich, Dinah C, NP  linagliptin (TRADJENTA) 5 MG TABS tablet Take 1 tablet (5 mg total) by mouth daily. 04/28/20   Lewie Chamber, MD  liver oil-zinc oxide (DESITIN) 40 % ointment Apply 1 application topically 2 (two) times daily as needed for irritation. 12/26/19   Ngetich, Dinah C, NP  midodrine (PROAMATINE) 5 MG tablet TAKE 1 TAB IN AM, NOON, & BEDTIME FOR ORTHOSTATIC HYPOTENSION NEEDS APPOINTMENT BEFORE MORE REFILLS 09/18/20   Ngetich, Dinah C, NP  NOVOLOG FLEXPEN 100 UNIT/ML FlexPen INJECT 2-15 UNITS INTO THE SKIN  4 TIMES DAILY - BEFORE MEALS AND AT BEDTIME. SSI: 121-150 = 2 UNITS 151-200 = 3 UNITS, 201-250 = 5 UNITS, 251-300 = 8 UNITS, 301-350 = 11 UNITS, 351-300 = 15 UNITS, GREATER THAN 400 = 15 UNITS AND CALL MD Patient taking differently: Inject 2-15 Units into the skin in the morning, at noon, in the evening, and at bedtime. 08/03/20   Ngetich, Dinah C, NP  senna-docusate (SENOKOT-S) 8.6-50 MG tablet Take 1 tablet by mouth at bedtime as needed for mild constipation. 04/27/20   Lewie Chamber, MD  tamsulosin (FLOMAX) 0.4 MG CAPS capsule Take 1 capsule (0.4 mg total) by mouth daily after supper. 12/27/19   Ngetich, Dinah C, NP  traZODone (DESYREL) 50 MG tablet TAKE 1/2-1 TABLET BY MOUTH AT BEDTIME AS NEEDED FOR SLEEP. Patient taking differently: Take 50 mg by mouth at bedtime. 03/30/20   Ngetich, Dinah C, NP  Vitamin D3 (VITAMIN D) 25 MCG tablet Take 0.5 tablets (500 Units total) by mouth daily. 08/30/19   Margit Hanks, MD    Allergies    Ciprofloxacin, Keppra [levetiracetam], Avocado, and Metformin  and related  Review of Systems   Review of Systems  Unable to perform ROS: Mental status change    Physical Exam Updated Vital Signs Temp 98.4 F (36.9 C) (Rectal)   Physical Exam Vitals and nursing note reviewed.  Constitutional:      General: He is not in acute distress.    Appearance: He is ill-appearing.     Comments: Patient is warm to touch  HENT:     Head: Normocephalic.     Right Ear: External ear normal.     Left Ear: External ear normal.     Nose: Nose normal.     Mouth/Throat:     Mouth: Mucous membranes are dry.     Pharynx: Oropharynx is clear.  Cardiovascular:     Rate and Rhythm: Normal rate and regular rhythm.     Pulses: Normal pulses.  Pulmonary:     Effort: Pulmonary effort is normal.     Comments: Breath sounds decreased throughout Abdominal:     General: Abdomen is flat. Bowel sounds are normal. There is no distension.     Palpations: Abdomen is soft.     Tenderness: There is no abdominal tenderness.  Musculoskeletal:        General: No tenderness or deformity.     Cervical back: Normal range of motion.     Comments: With right upper extremity contracture  Skin:    General: Skin is warm and dry.     Capillary Refill: Capillary refill takes less than 2 seconds.  Neurological:     Mental Status: He is alert.     Comments: Patient does not verbalize to me.  He does squeeze fingers on the left and wiggle his toes on the left.  Right upper extremity is contractured he does not move his right upper extremity or his right lower extremity     ED Results / Procedures / Treatments   Labs (all labs ordered are listed, but only abnormal results are displayed) Labs Reviewed  URINE CULTURE  CULTURE, BLOOD (ROUTINE X 2)  CULTURE, BLOOD (ROUTINE X 2)  RESP PANEL BY RT-PCR (FLU A&B, COVID) ARPGX2  LACTIC ACID, PLASMA  LACTIC ACID, PLASMA  COMPREHENSIVE METABOLIC PANEL  CBC WITH DIFFERENTIAL/PLATELET  PROTIME-INR  APTT  URINALYSIS, ROUTINE W REFLEX  MICROSCOPIC    EKG None  Radiology No results found.  Procedures Procedures  Medications Ordered in ED Medications  LORazepam (ATIVAN) injection 1 mg (has no administration in time range)    ED Course  I have reviewed the triage vital signs and the nursing notes.  Pertinent labs & imaging results that were available during my care of the patient were reviewed by me and considered in my medical decision making (see chart for details).    MDM Rules/Calculators/A&P                          70 yo male ho stroke, with central right-sided hemiplegia with contractures, nonverbal and nonambulatory, with recent admission for UTI presents today with not taking meds and aggressive behavior.  Here in the ED he was given Ativan and being started on Risperdal.  Otherwise work-up does not show any evidence of acute infection or difficult metabolic abnormality.  Patient is at baseline mental status per old records.   Attempted to call daughter as patient does not have indication for admission. Daughter, [name], power of attorney, called me back.  She states that patient has not been taking his medications and was concerned especially about his Lamictal.  We are attempting to give him Lamictal in his applesauce.  She wishes to have ongoing care.  We discussed DNR, but she does not wish to put any DNR in place at this time.  Transitions of care consult placed for skilled nursing facility placement and palliative care referral as outpatient. Patient took Lamictal p.o. Transitions of care will follow up regarding outpatient skilled nursing facility placement and palliative care referral Final Clinical Impression(s) / ED Diagnoses Final diagnoses:  Agitation    Rx / DC Orders ED Discharge Orders    None       Margarita Grizzle, MD 10/01/20 1531

## 2020-10-01 NOTE — ED Notes (Signed)
Ptar update pt is now 4 on the list

## 2020-10-01 NOTE — ED Notes (Signed)
Ptar called by unable to give time frame

## 2020-10-01 NOTE — ED Notes (Signed)
Difficulty obtaining IV x2 nurses with call Iv team

## 2020-10-01 NOTE — ED Triage Notes (Signed)
Pt arrives via EMS from home- pt's family reported that he refused taking his night time meds. Pt not eating and drinking since yesterday. This morning, pt more aggressive than normal. Pt responds "yes" to everything. Baseline, pt can answer yes and no questions. Pt is bedbound at baseline. BP 156/82, HR 74, 94% on room air. CBG 114

## 2020-10-02 LAB — URINE CULTURE: Culture: NO GROWTH

## 2020-10-06 LAB — CULTURE, BLOOD (ROUTINE X 2): Culture: NO GROWTH

## 2020-10-08 ENCOUNTER — Ambulatory Visit (INDEPENDENT_AMBULATORY_CARE_PROVIDER_SITE_OTHER): Payer: Medicare Other | Admitting: Adult Health

## 2020-10-08 ENCOUNTER — Encounter: Payer: Self-pay | Admitting: Adult Health

## 2020-10-08 ENCOUNTER — Other Ambulatory Visit: Payer: Self-pay

## 2020-10-08 VITALS — BP 130/90 | HR 81 | Temp 97.5°F | Resp 16 | Ht 73.0 in

## 2020-10-08 DIAGNOSIS — E876 Hypokalemia: Secondary | ICD-10-CM

## 2020-10-08 DIAGNOSIS — F5101 Primary insomnia: Secondary | ICD-10-CM | POA: Diagnosis not present

## 2020-10-08 DIAGNOSIS — I6990 Unspecified sequelae of unspecified cerebrovascular disease: Secondary | ICD-10-CM

## 2020-10-08 DIAGNOSIS — R1310 Dysphagia, unspecified: Secondary | ICD-10-CM | POA: Diagnosis not present

## 2020-10-08 DIAGNOSIS — I639 Cerebral infarction, unspecified: Secondary | ICD-10-CM | POA: Diagnosis not present

## 2020-10-08 DIAGNOSIS — E1159 Type 2 diabetes mellitus with other circulatory complications: Secondary | ICD-10-CM

## 2020-10-08 DIAGNOSIS — R451 Restlessness and agitation: Secondary | ICD-10-CM

## 2020-10-08 DIAGNOSIS — I152 Hypertension secondary to endocrine disorders: Secondary | ICD-10-CM

## 2020-10-08 MED ORDER — CARVEDILOL 12.5 MG PO TABS: 12.5000 mg | ORAL_TABLET | Freq: Two times a day (BID) | ORAL | 3 refills | Status: DC

## 2020-10-08 MED ORDER — CLOPIDOGREL BISULFATE 75 MG PO TABS: 75.0000 mg | ORAL_TABLET | Freq: Every day | ORAL | 1 refills | Status: DC

## 2020-10-08 MED ORDER — QUETIAPINE FUMARATE 25 MG PO TABS: 25.0000 mg | ORAL_TABLET | Freq: Every day | ORAL | 3 refills | Status: DC

## 2020-10-08 MED ORDER — AMLODIPINE BESYLATE 10 MG PO TABS: 10.0000 mg | ORAL_TABLET | Freq: Every day | ORAL | 1 refills | Status: DC

## 2020-10-08 MED ORDER — INDAPAMIDE 1.25 MG PO TABS: ORAL_TABLET | ORAL | 3 refills | Status: DC

## 2020-10-08 MED ORDER — TRAZODONE HCL 50 MG PO TABS: 50.0000 mg | ORAL_TABLET | Freq: Every day | ORAL | 1 refills | Status: DC

## 2020-10-08 NOTE — Progress Notes (Signed)
[name] clinic  Provider: Kenard Gower  DNP  Code Status:  Full Code  Goals of Care:  Advanced Directives 10/08/2020  Does Patient Have a Medical Advance Directive? No  Type of Advance Directive -  Does patient want to make changes to medical advance directive? -  Copy of Healthcare Power of Attorney in Chart? -  Would patient like information on creating a medical advance directive? No - Patient declined     Chief Complaint  Patient presents with   Acute Visit    Patient has questions about behavior medication side effects.    HPI: Patient is a 70 y.o. male seen today for an acute visit for behavior medication. He had multiple strokes with right-sided hemiplegia. He is bedbound and needing total care. He is nonverbal. Of note, he was brought to ED on 10/01/20 for agitation. It was reported by EMS that patient was home with daughter and refused to take his medications. Furthermore, it was reported by family that he has not been eating and drinking X 1 day and has been more aggressive than normal. He responds "yes" to everything. In the ED, he was given Ativan and was started on Risperdal. ED work-up was negative for any evidence of acute infection or metabolic abnormality. Daughter was concerned about patient not taking his Lamictal. Lamictal was mixed with applesauce and he took Lamictal. Daughter does not wish DNR at this time. Transitions of care regarding outpatient skilled nursing facility placement  and palliative care referral was discussed.  Today at [facility], he was accompanied by daughter. Daughter has not given Risperdal to patient because she was afraid of the side effects she has read. Daughter reported that she has been giving Amlodipine 10 mg daily and if the BP is still high, she will give another dose of Amlodipine 10 mg daily. She further stated that she gives Coreg 12.5 mg only once a day now since she doesn't have much supply and doesn't want to run out of  medication. She said that she doesn't give him Tradjenta for diabetes medications. Daughter was told that he has used up his medicare/therapy day for this year Daughter stated that patient only used 30 days of short-term rehabilitation after he was hospitalized. Discussed for her to talk to billing department of the SNF to discuss what are the possible placement options for patient to which the daughter agreed.   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   Renal insufficiency 04/04/2018   Seizures (HCC)    Stroke (HCC)    UTI (urinary tract infection) 03/05/2019    Past Surgical History:  Procedure Laterality Date   IR PATIENT EVAL TECH 0-60 MINS  03/30/2018   LOOP RECORDER INSERTION N/A 03/08/2019   Procedure: LOOP RECORDER INSERTION;  Surgeon: Regan Lemming, MD;  Location: [facility] INVASIVE CV LAB;  Service: Cardiovascular;  Laterality: N/A;   TEE  WITHOUT CARDIOVERSION N/A 04/02/2018   Procedure: TRANSESOPHAGEAL ECHOCARDIOGRAM (TEE);  Surgeon: [name], MD;  Location: [facility] ENDOSCOPY;  Service: Cardiovascular;  Laterality: N/A;    Allergies  Allergen Reactions   Ciprofloxacin Anaphylaxis   Keppra [Levetiracetam] Other (See Comments)    Makes the patient very agitated and "not like himself"   Avocado Nausea And Vomiting   Metformin And Related Other (See Comments)    "Renal issue"    Outpatient Encounter Medications as of 10/08/2020  Medication Sig   amLODipine (NORVASC) 10 MG tablet Take 1  tablet (10 mg total) by mouth daily.   ascorbic acid (VITAMIN C) 500 MG tablet Take 1 tablet (500 mg total) by mouth daily as needed (if cold-like symptoms are present).   aspirin EC 325 MG tablet Take 325 mg by mouth every morning.   atorvastatin (LIPITOR) 40 MG tablet Take 1 tablet (40 mg total) by mouth daily at 6 (six) AM.   baclofen (LIORESAL) 20 MG tablet TAKE 1 TABLET BY MOUTH TWICE A DAY   carvedilol (COREG) 12.5 MG tablet Take 1 tablet (12.5 mg total) by mouth 2 (two) times daily with a meal. WILL NEED APPT FOR FUTURE REFILLS   ciclopirox (PENLAC) 8 % solution Apply topically at bedtime. Apply over nail and surrounding skin. Apply daily over previous coat. After seven (7) days, may remove with alcohol and continue cycle.   clopidogrel (PLAVIX) 75 MG tablet Take 1 tablet (75 mg total) by mouth daily.   glipiZIDE (GLUCOTROL) 10 MG tablet TAKE 1 TABLET TWICE A DAY WITH A MEAL. NEED APPT   indapamide (LOZOL) 1.25 MG tablet TAKE 1 TABLET BY MOUTH DAILY. NEEDS AN APPOINTMENT BEFORE ANYMORE FUTURE REFILL.   insulin detemir (LEVEMIR) 100 UNIT/ML injection Inject 0.05 mLs (5 Units total) into the skin daily. If CBG >125 (per daughter)   ketoconazole (NIZORAL) 2 % cream Apply 1 application topically daily.   lamoTRIgine (LAMICTAL) 150 MG tablet TAKE 1 TABLET BY MOUTH TWICE A DAY   liver oil-zinc oxide (DESITIN) 40 % ointment Apply 1 application topically 2 (two) times daily as needed for irritation.   midodrine (PROAMATINE) 5 MG tablet TAKE 1 TAB IN AM, NOON, & BEDTIME FOR ORTHOSTATIC HYPOTENSION NEEDS APPOINTMENT BEFORE MORE REFILLS   NOVOLOG FLEXPEN 100 UNIT/ML FlexPen INJECT 2-15 UNITS INTO THE SKIN 4 TIMES DAILY - BEFORE MEALS AND AT BEDTIME. SSI: 121-150 = 2 UNITS 151-200 = 3 UNITS, 201-250 = 5 UNITS, 251-300 = 8 UNITS, 301-350 = 11 UNITS, 351-300 = 15 UNITS, GREATER THAN 400 = 15 UNITS AND CALL MD   senna-docusate (SENOKOT-S) 8.6-50 MG tablet Take 1 tablet by mouth at bedtime as needed for mild  constipation.   tamsulosin (FLOMAX) 0.4 MG CAPS capsule Take 1 capsule (0.4 mg total) by mouth daily after supper.   traZODone (DESYREL) 50 MG tablet Take 50 mg by mouth at bedtime.   Vitamin D3 (VITAMIN D) 25 MCG tablet Take 0.5 tablets (500 Units total) by mouth daily.   [DISCONTINUED] traZODone (DESYREL) 50 MG tablet TAKE 1/2-1 TABLET BY MOUTH AT BEDTIME AS NEEDED FOR SLEEP.   linagliptin (TRADJENTA) 5 MG TABS tablet Take 1 tablet (5 mg total) by mouth daily. (Patient not taking: Reported on 10/08/2020)   risperiDONE (RISPERDAL) 2 MG tablet Take 1 tablet (2 mg total) by mouth at bedtime. (Patient not taking: Reported on 10/08/2020)   No facility-administered encounter medications on file as of 10/08/2020.    Review of Systems:   Unable to obtain  due to nonverbal   Health Maintenance  Topic Date Due   FOOT EXAM  Never done   OPHTHALMOLOGY EXAM  Never done   URINE MICROALBUMIN  Never done   Hepatitis C Screening  Never done   TETANUS/TDAP  Never done   COLONOSCOPY (Pts 45-43yrs Insurance coverage will need to be confirmed)  Never done   Zoster Vaccines- Shingrix (1 of 2) Never done   PNA vac Low Risk Adult (2 of 2 - PCV13) 01/01/2019   COVID-19 Vaccine (3 - Booster) 09/20/2020   INFLUENZA VACCINE  11/30/2020   HEMOGLOBIN A1C  03/09/2021   HPV VACCINES  Aged Out    Physical Exam: Vitals:   10/08/20 1542  BP: 130/90  Pulse: 81  Resp: 16  Temp: (!) 97.5 F (36.4 C)  SpO2: 98%  Height:  (1.854 m)   Body mass index is 20.94 kg/m. Physical Exam Constitutional:      Appearance: He is normal weight.     Comments: Sitting on wheelchair with gait belt.  HENT:     Head: Normocephalic and atraumatic.     Mouth/Throat:     Mouth: Mucous membranes are moist.  Eyes:     Conjunctiva/sclera: Conjunctivae normal.  Cardiovascular:     Rate and Rhythm: Normal rate and regular rhythm.     Pulses: Normal pulses.     Heart sounds: Normal heart sounds.  Pulmonary:     Effort:  Pulmonary effort is normal.     Breath sounds: Normal breath sounds.  Abdominal:     General: Bowel sounds are normal.  Musculoskeletal:     Right lower leg: No edema.     Left lower leg: No edema.  Skin:    General: Skin is warm.     Comments: Daughter stated that he has a dry open wound on sacral area, "healing"  Neurological:     Comments: Right hemiplegia, nonverbal  Psychiatric:        Mood and Affect: Mood normal.    Labs reviewed: Basic Metabolic Panel: Recent Labs    03/06/20 1218 03/10/20 2125 04/22/20 0249 04/23/20 0255 04/24/20 0236 04/26/20 0228 05/21/20 1705 08/09/20 1703 09/06/20 1434 09/08/20 0230 09/09/20 0221 10/01/20 1146  NA 141   < > 137   < > 140 139   < > 144   < > 137 136 141  K 4.4   < > 3.7   < > 3.5 3.4*   < > 2.9*   < > 3.8 3.7 3.2*  CL 102   < > 101   < > 105 101   < > 114*   < > 103 102 103  CO2 28   < > 23   < > 25 27   < > 26   < > GLUCOSE 141*   < > 246*   < > 188* 232*   < > 89   < > 136* 219* 143*  BUN 30*   < > 10   < > 27* 17   < > 15   < > CREATININE 1.49*   < > 0.98   < > 1.67* 1.10   < > 0.88   < > 1.11 1.19 1.20  CALCIUM 9.8   < > 9.1   < > 9.5 9.1   < > 7.4*   < > 9.5 9.0 10.0  MG  --   --  1.7   < >  2.1 1.7  --  1.6*  --   --   --   --   TSH 2.07  --  1.356  --   --   --   --   --   --   --   --   --    < > = values in this interval not displayed.   Liver Function Tests: Recent Labs    06/10/20 2229 08/09/20 1703 10/01/20 1146  AST 16 16 25   ALT 20 32 34  ALKPHOS 73 60 82  BILITOT 0.8 0.8 1.5*  PROT 6.9 4.9* 7.3  ALBUMIN 3.8 2.8* 4.3   Recent Labs    11/18/19 2153 06/10/20 2229  LIPASE 26 30   Recent Labs    02/23/20 1607 05/21/20 1705  AMMONIA 26 25   CBC: Recent Labs    08/09/20 1703 09/06/20 1434 09/07/20 0430 10/01/20 1146  WBC 3.8* 5.7 6.2 5.6  NEUTROABS 2.3 3.6  --  3.8  HGB 11.0* 14.7 12.6* 13.4  HCT 35.0* 46.4 39.0 42.3  MCV 94.3 93.7 92.6 93.0  PLT 157 212 212 250    Lipid Panel: Recent Labs    03/06/20 1218 04/22/20 0249  CHOL 114 101  HDL 35* 34*  LDLCALC 63 59  TRIG 84 40  CHOLHDL 3.3 3.0   Lab Results  Component Value Date   HGBA1C 6.8 (H) 09/06/2020    Procedures since last visit: CT Head Wo Contrast  Result Date: 10/01/2020 CLINICAL DATA:  Mental status change EXAM: CT HEAD WITHOUT CONTRAST TECHNIQUE: Contiguous axial images were obtained from the base of the skull through the vertex without intravenous contrast. COMPARISON:  CT head 09/06/2020 FINDINGS: Brain: Large territory chronic infarct in the left MCA and left PCA territory unchanged. There is chronic infarct in the left thalamus unchanged. There is compensatory enlargement of the left lateral ventricle. Generalized atrophy. Chronic microvascular ischemic change in the white matter bilaterally. Negative for acute infarct, hemorrhage, mass Vascular: Negative for hyperdense vessel. Atherosclerotic calcification carotid and vertebral arteries bilaterally. Skull: Negative Sinuses/Orbits: Negative Other: None IMPRESSION: No acute abnormality no change from the prior CT Large chronic infarct left MCA and PCA territory Electronically Signed   By: Marlan Palauharles  Clark M.D.   On: 10/01/2020 11:18   DG Chest Port 1 View  Result Date: 10/01/2020 CLINICAL DATA:  Symptoms concerning for sepsis EXAM: PORTABLE CHEST 1 VIEW COMPARISON:  Sep 06, 2020 FINDINGS: No edema or airspace opacity. Heart size and pulmonary vascularity are normal. No adenopathy. There is aortic atherosclerosis. There is degenerative change in each shoulder. There is a loop recorder on the left. IMPRESSION: No edema or airspace opacity. Stable cardiac silhouette. Loop recorder on left. Aortic Atherosclerosis (ICD10-I70.0). Electronically Signed   By: Bretta BangWilliam  Woodruff III M.D.   On: 10/01/2020 12:17   DG Swallowing Func-Speech Pathology  Result Date: 09/10/2020 Objective Swallowing Evaluation: Type of Study: MBS-Modified Barium Swallow  Study  Patient Details Name: Douglas CordiaWilliam L Pulse Jr. MRN: [id number] Date of Birth: 04-22-1951 Today's Date: 09/10/2020 Time: SLP Start Time (ACUTE ONLY): 1342 -SLP Stop Time (ACUTE ONLY): 1357 SLP Time Calculation (min) (ACUTE ONLY): 15 min Past Medical History: Past Medical History: Diagnosis Date  Acute blood loss anemia   Acute embolic stroke (HCC)   Acute lower UTI   AMS (altered mental status) 06/05/2019  Cerebral edema (HCC) 04/04/2018  Diabetes 1.5, managed as type 2 (HCC)   Diabetes mellitus without complication (HCC)   High cholesterol   High  glucose   Diabetes  History of CVA with residual deficit   History of Dysphagia, post-stroke   Hypertension   Ischemic stroke (HCC) 03/05/2019  Labile blood glucose   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  Renal insufficiency 04/04/2018  Seizures (HCC)   Stroke (HCC)   UTI (urinary tract infection) 03/05/2019 Past Surgical History: Past Surgical History: Procedure Laterality Date  IR PATIENT EVAL TECH 0-60 MINS  03/30/2018  LOOP RECORDER INSERTION N/A 03/08/2019  Procedure: LOOP RECORDER INSERTION;  Surgeon: Regan Lemming, MD;  Location: [facility] INVASIVE CV LAB;  Service: Cardiovascular;  Laterality: N/A;  TEE WITHOUT CARDIOVERSION N/A 04/02/2018  Procedure: TRANSESOPHAGEAL ECHOCARDIOGRAM (TEE);  Surgeon: [name], MD;  Location: [facility] ENDOSCOPY;  Service: Cardiovascular;  Laterality: N/A; HPI: 70 year old male with medical history significant for but not limited to multiple CVAs with residual right-sided hemiplegia at baseline severe expressive aphasia with receptive impairment, communicates with Y/N and gestures. Pt presented with altered mentation: bizarre behavior and frequent episodes of confusion along with decreased oral intake.  Admitted for acute metabolic encephalopathy secondary to acute lower UTI and acute kidney injury.Pt has a history of dysphagia, last MBS in 12/21 recommended "dys 1, nectar thick liquids with full supervision for use of the  following swallow precautions: avoid mixed consistencies, small bites and sips, upright for meals and for 30-60 min after, meds crushed in puree." Daughter reports that pt went to rehab after that admission and by the time he was home he was back on regular diet and thin liquids.  No data recorded Assessment / Plan / Recommendation CHL IP CLINICAL IMPRESSIONS 09/10/2020 Clinical Impression Pt presented with oropharyngeal dysphagia with impairments similar to those noted during prior studies, but with less impact on safety. He presented with reduced bolus cohesion, a pharyngeal delay, and reduced lingual retraction. He demonstrated vallecular residue, and premature spillage to the valleculae with spillover to the pyriform sinuses. Mild vallecular and pyriform sisnus residue was cleared with secondary swallows. Pt demonstrated aspiration (PAS 7) during intake of a pill with thin liquids via cup when liquids spilled into the airway from the pyriform sinuses. However, no other instances of penetration/aspiration were noted even when pt's swallow was challenged and pt's cough was effective in expelling the aspirate. Pt's diet will be advanced to regular texture solids and thin liquids. SLP will follow briefly to ensure tolerance of the advanced diet. SLP Visit Diagnosis Dysphagia, unspecified (R13.10) Attention and concentration deficit following -- Frontal lobe and executive function deficit following -- Impact on safety and function Mild aspiration risk;Moderate aspiration risk   CHL IP TREATMENT RECOMMENDATION 09/10/2020 Treatment Recommendations Therapy as outlined in treatment plan below   Prognosis 09/10/2020 Prognosis for Safe Diet Advancement Good Barriers to Reach Goals Cognitive deficits Barriers/Prognosis Comment -- CHL IP DIET RECOMMENDATION 09/10/2020 SLP Diet Recommendations Regular solids;Thin liquid Liquid Administration via Cup;No straw Medication Administration Whole meds with puree Compensations Slow  rate;Small sips/bites Postural Changes Seated upright at 90 degrees   CHL IP OTHER RECOMMENDATIONS 09/10/2020 Recommended Consults -- Oral Care Recommendations Oral care BID Other Recommendations --   CHL IP FOLLOW UP RECOMMENDATIONS 09/10/2020 Follow up Recommendations Skilled Nursing facility   [facility] IP FREQUENCY AND DURATION 09/10/2020 Speech Therapy Frequency (ACUTE ONLY) min 2x/week Treatment Duration 1 week      CHL IP ORAL PHASE 09/10/2020 Oral Phase Impaired Oral - Pudding Teaspoon -- Oral - Pudding Cup -- Oral - Honey Teaspoon -- Oral - Honey Cup -- Oral - Nectar Teaspoon --  Oral - Nectar Cup -- Oral - Nectar Straw -- Oral - Thin Teaspoon -- Oral - Thin Cup Decreased bolus cohesion;Premature spillage Oral - Thin Straw Decreased bolus cohesion;Premature spillage Oral - Puree Decreased bolus cohesion;Premature spillage Oral - Mech Soft -- Oral - Regular Decreased bolus cohesion;Premature spillage Oral - Multi-Consistency -- Oral - Pill Decreased bolus cohesion;Premature spillage Oral Phase - Comment --  CHL IP PHARYNGEAL PHASE 09/10/2020 Pharyngeal Phase Impaired Pharyngeal- Pudding Teaspoon -- Pharyngeal -- Pharyngeal- Pudding Cup -- Pharyngeal -- Pharyngeal- Honey Teaspoon -- Pharyngeal -- Pharyngeal- Honey Cup -- Pharyngeal -- Pharyngeal- Nectar Teaspoon -- Pharyngeal -- Pharyngeal- Nectar Cup -- Pharyngeal -- Pharyngeal- Nectar Straw -- Pharyngeal -- Pharyngeal- Thin Teaspoon -- Pharyngeal -- Pharyngeal- Thin Cup Reduced tongue base retraction;Pharyngeal residue - valleculae;Pharyngeal residue - pyriform;Delayed swallow initiation-pyriform sinuses Pharyngeal -- Pharyngeal- Thin Straw Reduced tongue base retraction;Pharyngeal residue - valleculae;Pharyngeal residue - pyriform;Delayed swallow initiation-pyriform sinuses Pharyngeal -- Pharyngeal- Puree Reduced tongue base retraction;Pharyngeal residue - valleculae;Pharyngeal residue - pyriform;Delayed swallow initiation-pyriform sinuses;Delayed swallow  initiation-vallecula Pharyngeal -- Pharyngeal- Mechanical Soft -- Pharyngeal -- Pharyngeal- Regular Reduced tongue base retraction;Pharyngeal residue - valleculae;Pharyngeal residue - pyriform;Delayed swallow initiation-pyriform sinuses Pharyngeal -- Pharyngeal- Multi-consistency -- Pharyngeal -- Pharyngeal- Pill Reduced tongue base retraction;Pharyngeal residue - valleculae;Pharyngeal residue - pyriform;Delayed swallow initiation-pyriform sinuses Pharyngeal -- Pharyngeal Comment --  CHL IP CERVICAL ESOPHAGEAL PHASE 09/10/2020 Cervical Esophageal Phase WFL Pudding Teaspoon -- Pudding Cup -- Honey Teaspoon -- Honey Cup -- Nectar Teaspoon -- Nectar Cup -- Nectar Straw -- Thin Teaspoon -- Thin Cup -- Thin Straw -- Puree -- Mechanical Soft -- Regular -- Multi-consistency -- Pill -- Cervical Esophageal Comment -- Shanika I. Vear Clock, MS, CCC-SLP Acute Rehabilitation Services Office number [phone] Pager [phone] Scheryl Marten 09/10/2020, 3:09 PM              EEG adult  Result Date: 09/08/2020 Charlsie Quest, MD     09/08/2020  6:29 PM Patient Name: Douglas Cordia. MRN: [id number] Epilepsy Attending: Charlsie Quest Referring Physician/Provider: Dr Corinna Lines Date: 09/08/2020 Duration: 25.53 mins Patient history: 70yo m with ams. EEG to evaluate for seizure Level of alertness: Awake,asleep AEDs during EEG study: LTG Technical aspects: This EEG study was done with scalp electrodes positioned according to the 10-20 International system of electrode placement. Electrical activity was acquired at a sampling rate of 500Hz  and reviewed with a high frequency filter of 70Hz  and a low frequency filter of 1Hz . EEG data were recorded continuously and digitally stored. Description: The posterior dominant rhythm consists of 8-9 Hz activity of moderate voltage (25-35 uV) seen predominantly in posterior head regions, asymmetric ( L<R) and reactive to eye opening and eye closing. Sleep was characterized by vertex  waves, sleep spindles (12 to 14 Hz), maximal right frontocentral region.  EEG showed continuous low amplitude 3 to 6 Hz theta-delta slowing in left hemisphere.  Hyperventilation and photic stimulation were not performed.   ABNORMALITY - Continuous slow,  left hemisphere - Background asymmetry, left<right IMPRESSION: This study is suggestive of cortical dysfunction arising from left hemisphere likely secondary to underlying structural abnormality/ stroke. No seizures or epileptiform discharges were seen throughout the recording.   CUP PACEART REMOTE DEVICE CHECK  Result Date: 09/14/2020 ILR summary report received. Battery status OK. Normal device function. No new symptom, tachy, brady, or pause episodes. No new AF episodes. Monthly summary reports and ROV/PRN. LH   Assessment/Plan  1. Late effects of CVA (cerebrovascular accident)  - clopidogrel (  PLAVIX) 75 MG tablet; Take 1 tablet (75 mg total) by mouth daily.  Dispense: 90 tablet; Refill: 1 - amLODipine (NORVASC) 10 MG tablet; Take 1 tablet (10 mg total) by mouth daily.  Dispense: 90 tablet; Refill: 1 - carvedilol (COREG) 12.5 MG tablet; Take 1 tablet (12.5 mg total) by mouth 2 (two) times daily with a meal. WILL NEED APPT FOR FUTURE REFILLS  Dispense: 60 tablet; Refill: 3 - Ambulatory referral to Home Health PT, OT, Nurse and ST - PT and OT for therapeutic strengthening exercises -  Nurse for wound treatment and medication management  2. Dysphagia, unspecified type - referred to Home health ST for swallowing techniques and communication strategies -  aspiration precautions  3. Hypertension associated with diabetes (HCC)  - amLODipine (NORVASC) 10 MG tablet; Take 1 tablet (10 mg total) by mouth daily.  Dispense: 90 tablet; Refill: 1 - carvedilol (COREG) 12.5 MG tablet; Take 1 tablet (12.5 mg total) by mouth 2 (two) times daily with a meal. WILL NEED APPT FOR FUTURE REFILLS  Dispense: 60 tablet; Refill: 3 - indapamide  (LOZOL) 1.25 MG tablet; TAKE 1 TABLET BY MOUTH DAILY. NEEDS AN APPOINTMENT BEFORE ANYMORE FUTURE REFILL.  Dispense: 30 tablet; Refill: 3  4. Type 2 diabetes mellitus with other circulatory complications (HCC) Lab Results  Component Value Date   HGBA1C 6.8 (H) 09/06/2020   -  continue Glipizide, Levemir and Tradjenta  5. Agitation  -  discontinue Risperdal and will start on Seroquel - QUEtiapine (SEROQUEL) 25 MG tablet; Take 1 tablet (25 mg total) by mouth at bedtime.  Dispense: 30 tablet; Refill: 3  6. Primary insomnia  - traZODone (DESYREL) 50 MG tablet; Take 1 tablet (50 mg total) by mouth at bedtime.  Dispense: 90 tablet; Refill: 1  7. Hypokalemia Lab Results  Component Value Date   K 3.2 (L) 10/01/2020   - Basic Metabolic Panel     Labs/tests ordered:  BMP  Next appt:  Visit date not found

## 2020-10-08 NOTE — Patient Instructions (Signed)

## 2020-10-09 ENCOUNTER — Emergency Department (HOSPITAL_COMMUNITY): Payer: Medicare Other

## 2020-10-09 ENCOUNTER — Emergency Department (HOSPITAL_COMMUNITY)
Admission: EM | Admit: 2020-10-09 | Discharge: 2020-10-10 | Disposition: A | Discharge status: Home / Self Care | Payer: Medicare Other | Attending: Emergency Medicine | Admitting: Emergency Medicine

## 2020-10-09 DIAGNOSIS — N182 Chronic kidney disease, stage 2 (mild): Secondary | ICD-10-CM | POA: Diagnosis not present

## 2020-10-09 DIAGNOSIS — Z7982 Long term (current) use of aspirin: Secondary | ICD-10-CM | POA: Insufficient documentation

## 2020-10-09 DIAGNOSIS — R404 Transient alteration of awareness: Secondary | ICD-10-CM

## 2020-10-09 DIAGNOSIS — R4182 Altered mental status, unspecified: Secondary | ICD-10-CM | POA: Insufficient documentation

## 2020-10-09 DIAGNOSIS — E1122 Type 2 diabetes mellitus with diabetic chronic kidney disease: Secondary | ICD-10-CM | POA: Insufficient documentation

## 2020-10-09 DIAGNOSIS — I129 Hypertensive chronic kidney disease with stage 1 through stage 4 chronic kidney disease, or unspecified chronic kidney disease: Secondary | ICD-10-CM | POA: Diagnosis not present

## 2020-10-09 DIAGNOSIS — E1169 Type 2 diabetes mellitus with other specified complication: Secondary | ICD-10-CM | POA: Diagnosis not present

## 2020-10-09 DIAGNOSIS — Z7984 Long term (current) use of oral hypoglycemic drugs: Secondary | ICD-10-CM | POA: Insufficient documentation

## 2020-10-09 DIAGNOSIS — R251 Tremor, unspecified: Secondary | ICD-10-CM | POA: Insufficient documentation

## 2020-10-09 DIAGNOSIS — Z794 Long term (current) use of insulin: Secondary | ICD-10-CM | POA: Insufficient documentation

## 2020-10-09 DIAGNOSIS — E785 Hyperlipidemia, unspecified: Secondary | ICD-10-CM | POA: Insufficient documentation

## 2020-10-09 DIAGNOSIS — E1142 Type 2 diabetes mellitus with diabetic polyneuropathy: Secondary | ICD-10-CM | POA: Diagnosis not present

## 2020-10-09 DIAGNOSIS — Z7902 Long term (current) use of antithrombotics/antiplatelets: Secondary | ICD-10-CM | POA: Insufficient documentation

## 2020-10-09 DIAGNOSIS — Z79899 Other long term (current) drug therapy: Secondary | ICD-10-CM | POA: Insufficient documentation

## 2020-10-09 LAB — COMPREHENSIVE METABOLIC PANEL
ALT: 33 U/L (ref 0–44)
AST: 19 U/L (ref 15–41)
Albumin: 3.8 g/dL (ref 3.5–5.0)
Alkaline Phosphatase: 77 U/L (ref 38–126)
Anion gap: 9 (ref 5–15)
BUN: 29 mg/dL — ABNORMAL HIGH (ref 8–23)
CO2: 31 mmol/L (ref 22–32)
Calcium: 9.7 mg/dL (ref 8.9–10.3)
Chloride: 101 mmol/L (ref 98–111)
Creatinine, Ser: 1.55 mg/dL — ABNORMAL HIGH (ref 0.61–1.24)
GFR, Estimated: 48 mL/min — ABNORMAL LOW (ref >60)
Glucose, Bld: 130 mg/dL — ABNORMAL HIGH (ref 70–99)
Potassium: 3.7 mmol/L (ref 3.5–5.1)
Sodium: 141 mmol/L (ref 135–145)
Total Bilirubin: 0.8 mg/dL (ref 0.3–1.2)
Total Protein: 6.7 g/dL (ref 6.5–8.1)

## 2020-10-09 LAB — CBC WITH DIFFERENTIAL/PLATELET
Abs Immature Granulocytes: 0.02 10*3/uL (ref 0.00–0.07)
Basophils Absolute: 0 10*3/uL (ref 0.0–0.1)
Basophils Relative: 0 %
Eosinophils Absolute: 0.1 10*3/uL (ref 0.0–0.5)
Eosinophils Relative: 2 %
HCT: 37.5 % — ABNORMAL LOW (ref 39.0–52.0)
Hemoglobin: 12.3 g/dL — ABNORMAL LOW (ref 13.0–17.0)
Immature Granulocytes: 0 %
Lymphocytes Relative: 23 %
Lymphs Abs: 1.3 10*3/uL (ref 0.7–4.0)
MCH: 30.5 pg (ref 26.0–34.0)
MCHC: 32.8 g/dL (ref 30.0–36.0)
MCV: 93.1 fL (ref 80.0–100.0)
Monocytes Absolute: 0.4 10*3/uL (ref 0.1–1.0)
Monocytes Relative: 7 %
Neutro Abs: 4 10*3/uL (ref 1.7–7.7)
Neutrophils Relative %: 68 %
Platelets: 201 10*3/uL (ref 150–400)
RBC: 4.03 MIL/uL — ABNORMAL LOW (ref 4.22–5.81)
RDW: 14.2 % (ref 11.5–15.5)
WBC: 5.9 10*3/uL (ref 4.0–10.5)
nRBC: 0 % (ref 0.0–0.2)

## 2020-10-09 LAB — URINALYSIS, ROUTINE W REFLEX MICROSCOPIC
Bilirubin Urine: NEGATIVE
Glucose, UA: NEGATIVE mg/dL
Ketones, ur: NEGATIVE mg/dL
Leukocytes,Ua: NEGATIVE
Nitrite: NEGATIVE
Protein, ur: NEGATIVE mg/dL
Specific Gravity, Urine: 1.018 (ref 1.005–1.030)
pH: 5 (ref 5.0–8.0)

## 2020-10-09 LAB — CBG MONITORING, ED: Glucose-Capillary: 135 mg/dL — ABNORMAL HIGH (ref 70–99)

## 2020-10-09 MED ORDER — SODIUM CHLORIDE 0.9 % IV BOLUS
1000.0000 mL | Freq: Once | INTRAVENOUS | Status: AC
Start: 2020-10-09 — End: 2020-10-09
  Administered 2020-10-09: 1000 mL via INTRAVENOUS

## 2020-10-09 MED ORDER — SODIUM CHLORIDE 0.9 % IV BOLUS: 500.0000 mL | Freq: Once | INTRAVENOUS | Status: DC

## 2020-10-09 NOTE — ED Provider Notes (Signed)
5:28 PM Signout from [employer] at shift change.   Pending completion of work-up.  Awaiting lab work. UA reviewed. Unable to obtain access -- awaiting IV team consult.   BP 107/74   Pulse 69   Temp 98.1 F (36.7 C) (Oral)   Resp 15   SpO2 97%   5:52 PM Went to check patient, IV team at bedside.   6:14 PM IV established in left forearm. Pt alert. Awaiting labs.   BP 98/71   Pulse 68   Temp 98.1 F (36.7 C) (Oral)   Resp 13   SpO2 100%   7:29 PM labs are reassuring.  I called the patient's daughter and updated her on the results.  We discussed medication change from yesterday from Risperdal to Seroquel for agitation and mood.  I doubt this is contributing.  Where that Lamictal level was sent and will need to be followed up by PCP.  Patient continues to be at his baseline.  Plan for discharge to home.  Will need PTAR for transport.  BP (!) 113/102   Pulse 72   Temp 98.1 F (36.7 C) (Oral)   Resp 12   SpO2 100%     Renne Crigler, PA-C 10/09/20 1930    Cathren Laine, MD 10/10/20 1535

## 2020-10-09 NOTE — ED Notes (Signed)
Confirmed address with daughter.

## 2020-10-09 NOTE — Discharge Instructions (Addendum)
Please read and follow all provided instructions.  Your diagnoses today include:  1. Transient alteration of awareness     Tests performed today include: Blood cell counts (white, red, and platelets) Electrolytes - normal potassium Kidney function test - slight weak kidney function, treated with IV fluids here today, should be rechecked by your PCP Lamictal level - please have this followed up by the provider who treats seizure to see if any medication adjustments need to be made Urine test - some blood noted in urine but no infection CT of the head - no acute problems X-ray of the chest - no problems or pneumonia Vital signs. See below for your results today.   Medications prescribed:   Take any prescribed medications only as directed.  Home care instructions:  Follow any educational materials contained in this packet.  BE VERY CAREFUL not to take multiple medicines containing Tylenol (also called acetaminophen). Doing so can lead to an overdose which can damage your liver and cause liver failure and possibly death.   Follow-up instructions: Please follow-up with your primary care provider in the next 3 days for further evaluation of your symptoms.   Return instructions:  Please return to the Emergency Department if you experience worsening symptoms.  Please return if you have any other emergent concerns.  Additional Information:  Your vital signs today were: BP (!) 113/102   Pulse 72   Temp 98.1 F (36.7 C) (Oral)   Resp 12   SpO2 100%  If your blood pressure (BP) was elevated above 135/85 this visit, please have this repeated by your doctor within one month. --------------

## 2020-10-09 NOTE — ED Provider Notes (Signed)
[facility] EMERGENCY DEPARTMENT Provider Note   CSN: [id number] Arrival date & time: 10/09/20  1237     History Chief Complaint  Patient presents with  . Altered Mental Status    Douglas [name]. is a 70 y.o. 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  Is nonverbal at baseline.  Level 5 caveat applies.  Spoke to patient's daughter, Douglas [name], who reports that she was contacted by home health nurse at approximately 1120.  Health nurse reports that patient had an episode where his eyes rolled back into his head and he did not respond for a few seconds.  After this patient opened his eyes and was staring straight ahead however was not acting his normal self.  Daughter reports that this happened approximately 3 times.  At 1140 patient started shaking his head yes and shaking his hand.  Per daughter he looked frightened.  Daughter reports that he still did not seem to be at his baseline mental status which is able to follow simple commands.  Patient has been taking all of his medications as prescribed.  Patient had all morning medications today.  Patient has not had any fevers, vomiting, or diarrhea per daughter.   Altered Mental Status     Past Medical History:  Diagnosis Date  . Acute blood loss anemia   . Acute embolic stroke (HCC)   . Acute lower UTI   . AMS (altered mental status) 06/05/2019  . Cerebral edema (HCC) 04/04/2018  . Diabetes 1.5, managed as type 2 (HCC)   . Diabetes mellitus without complication (HCC)   . High cholesterol   . High glucose    Diabetes  . History of CVA with residual deficit   . History of Dysphagia, post-stroke   . Hypertension   . Ischemic stroke (HCC) 03/05/2019  . Labile blood glucose   . 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 Renal  insufficiency 04/04/2018  . Seizures (HCC)   . Stroke (HCC)   . UTI (urinary tract infection) 03/05/2019    Patient Active Problem List   Diagnosis Date Noted  . AKI (acute kidney injury) (HCC)   . Decubitus ulcer of sacral region, stage 2 (HCC) 04/27/2020  . Protein-calorie malnutrition, severe 04/23/2020  . Physical debility 04/22/2020  . Hyperlipidemia 04/20/2020  . Slow transit constipation 12/26/2019  . Seizure (HCC) 11/20/2019  . Seizures (HCC) 11/20/2019  . Encephalopathy 11/19/2019  . Delayed orthostatic hypotension 08/04/2019  . Seizure disorder (HCC) 07/21/2019  . 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 Shoulder subluxation, right, sequela 07/16/2019  . Altered mental status 07/15/2019  . 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 Late effects of CVA (cerebrovascular accident)   . Pressure injury of skin 06/08/2019  . History of loop recorder 03/08/2019  . UTI (urinary tract infection) 03/05/2019  . 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 Global aphasia   . Type 2 diabetes mellitus with peripheral neuropathy (HCC)   . Hypertension associated with diabetes (HCC)   . Hyperlipidemia associated with type 2 diabetes mellitus (HCC)   . History of CVA (cerebrovascular accident)   . History of Dysphagia, post-stroke     Past Surgical History:  Procedure Laterality Date  . IR PATIENT EVAL TECH 0-60 MINS  03/30/2018  .  LOOP RECORDER INSERTION N/A 03/08/2019   Procedure: LOOP RECORDER INSERTION;  Surgeon: Regan Lemmingamnitz, Will Martin, MD;  Location: [facility] INVASIVE CV LAB;  Service: Cardiovascular;  Laterality: N/A;  . TEE WITHOUT CARDIOVERSION N/A 04/02/2018   Procedure: TRANSESOPHAGEAL ECHOCARDIOGRAM (TEE);  Surgeon: Chilton Siandolph, Tiffany, MD;  [facility];  Service: Cardiovascular;  Laterality: N/A;       Family History  Problem Relation Age of Onset  . Leukemia Mother   . Dementia Father      Social History   Tobacco Use  . Smoking status: Never  . Smokeless tobacco: Never  Vaping Use  . Vaping Use: Never used  Substance Use Topics  . Alcohol use: Not Currently  . Drug use: Never    Home Medications Prior to Admission medications   Medication Sig Start Date End Date Taking? Authorizing Provider  amLODipine (NORVASC) 10 MG tablet Take 1 tablet (10 mg total) by mouth daily. 10/08/20   Medina-Vargas, Monina C, NP  ascorbic acid (VITAMIN C) 500 MG tablet Take 1 tablet (500 mg total) by mouth daily as needed (if cold-like symptoms are present). 08/30/19   Margit HanksAlexander, Anne D, MD  aspirin EC 325 MG tablet Take 325 mg by mouth every morning.    [provider]  atorvastatin (LIPITOR) 40 MG tablet Take 1 tablet (40 mg total) by mouth daily at 6 (six) AM. 12/26/19   Ngetich, Dinah C, NP  baclofen (LIORESAL) 20 MG tablet TAKE 1 TABLET BY MOUTH TWICE A DAY 06/01/20   Ngetich, Dinah C, NP  carvedilol (COREG) 12.5 MG tablet Take 1 tablet (12.5 mg total) by mouth 2 (two) times daily with a meal. WILL NEED APPT FOR FUTURE REFILLS 10/08/20   Medina-Vargas, Monina C, NP  ciclopirox (PENLAC) 8 % solution Apply topically at bedtime. Apply over nail and surrounding skin. Apply daily over previous coat. After seven (7) days, may remove with alcohol and continue cycle. 06/01/20   Edwin CapMcDonald, Adam [name], DPM  clopidogrel (PLAVIX) 75 MG tablet Take 1 tablet (75 mg total) by mouth daily. 10/08/20   Medina-Vargas, Monina C, NP  glipiZIDE (GLUCOTROL) 10 MG tablet TAKE 1 TABLET TWICE A DAY WITH A MEAL. NEED APPT 09/18/20   Ngetich, Dinah C, NP  indapamide (LOZOL) 1.25 MG tablet TAKE 1 TABLET BY MOUTH DAILY. NEEDS AN APPOINTMENT BEFORE ANYMORE FUTURE REFILL. 10/08/20   Medina-Vargas, Monina C, NP  insulin detemir (LEVEMIR) 100 UNIT/ML injection Inject 0.05 mLs (5 Units total) into the skin daily. If CBG >125 (per daughter) 04/27/20   Douglas ChamberGirguis, David, MD  ketoconazole (NIZORAL) 2 % cream Apply 1 application  topically daily. 06/01/20   McDonald, Douglas [name], DPM  lamoTRIgine (LAMICTAL) 150 MG tablet TAKE 1 TABLET BY MOUTH TWICE A DAY 06/01/20   Ngetich, Dinah C, NP  linagliptin (TRADJENTA) 5 MG TABS tablet Take 1 tablet (5 mg total) by mouth daily. Patient not taking: Reported on 10/08/2020 04/28/20   Douglas ChamberGirguis, David, MD  liver oil-zinc oxide (DESITIN) 40 % ointment Apply 1 application topically 2 (two) times daily as needed for irritation. 12/26/19   Ngetich, Dinah C, NP  midodrine (PROAMATINE) 5 MG tablet TAKE 1 TAB IN AM, NOON, & BEDTIME FOR ORTHOSTATIC HYPOTENSION NEEDS APPOINTMENT BEFORE MORE REFILLS 09/18/20   Ngetich, Dinah C, NP  NOVOLOG FLEXPEN 100 UNIT/ML FlexPen INJECT 2-15 UNITS INTO THE SKIN 4 TIMES DAILY - BEFORE MEALS AND AT BEDTIME. SSI: 121-150 = 2 UNITS 151-200 = 3 UNITS, 201-250 = 5 UNITS, 251-300 = 8 UNITS, 301-350 =  11 UNITS, 351-300 = 15 UNITS, GREATER THAN 400 = 15 UNITS AND CALL MD 08/03/20   Ngetich, Dinah C, NP  QUEtiapine (SEROQUEL) 25 MG tablet Take 1 tablet (25 mg total) by mouth at bedtime. 10/08/20   Medina-Vargas, Monina C, NP  senna-docusate (SENOKOT-S) 8.6-50 MG tablet Take 1 tablet by mouth at bedtime as needed for mild constipation. 04/27/20   Douglas Chamber, MD  tamsulosin (FLOMAX) 0.4 MG CAPS capsule Take 1 capsule (0.4 mg total) by mouth daily after supper. 12/27/19   Ngetich, Dinah C, NP  traZODone (DESYREL) 50 MG tablet Take 1 tablet (50 mg total) by mouth at bedtime. 10/08/20   Medina-Vargas, Monina C, NP  Vitamin D3 (VITAMIN D) 25 MCG tablet Take 0.5 tablets (500 Units total) by mouth daily. 08/30/19   Margit Hanks, MD    Allergies    Ciprofloxacin, Keppra [levetiracetam], Avocado, and Metformin and related  Review of Systems   Review of Systems  Unable to perform ROS: Patient nonverbal   Physical Exam Updated Vital Signs BP 127/79 (BP Location: Left Arm)   Pulse 78   Temp 98.1 F (36.7 C) (Oral)   Resp 14   SpO2 99%   Physical Exam Vitals and nursing note  reviewed.  Constitutional:      General: He is not in acute distress.    Appearance: He is not ill-appearing, toxic-appearing or diaphoretic.  HENT:     Head: Normocephalic and atraumatic.  Eyes:     General: No scleral icterus.       Right eye: No discharge.        Left eye: No discharge.     Conjunctiva/sclera:     Right eye: Right conjunctiva is not injected. No chemosis, exudate or hemorrhage.    Left eye: Left conjunctiva is not injected. No chemosis, exudate or hemorrhage.    Pupils: Pupils are equal, round, and reactive to light.     Comments: Patient tracks provider appropriately  Cardiovascular:     Rate and Rhythm: Normal rate.     Heart sounds: Normal heart sounds.  Pulmonary:     Effort: Pulmonary effort is normal. No tachypnea, bradypnea or respiratory distress.     Breath sounds: Normal breath sounds. Decreased air movement present. No stridor.     Comments: Lungs clear to auscultation bilaterally, decreased air movement Abdominal:     General: Abdomen is flat.     Palpations: Abdomen is soft. There is no mass or pulsatile mass.     Tenderness: There is no abdominal tenderness. There is no guarding or rebound.  Musculoskeletal:     Cervical back: Neck supple.     Comments: No midline tenderness or deformity to cervical, thoracic, or lumbar spine   Skin:    General: Skin is warm and dry.     Coloration: Skin is not jaundiced or pale.  Neurological:     Mental Status: He is alert. Mental status is at baseline.     GCS: GCS eye subscore is 4. GCS verbal subscore is 1. GCS motor subscore is 5.     Comments: Patient is nonverbal, will nod head yes to any question.  Patient right arm contracted, does not move right upper or lower extremity.  Patient will squeeze left hand and wiggle left toes.  Right-sided facial droop    ED Results / Procedures / Treatments   Labs (all labs ordered are listed, but only abnormal results are displayed) Labs Reviewed  URINALYSIS,  ROUTINE W  REFLEX MICROSCOPIC - Abnormal; Notable for the following components:      Result Value   APPearance HAZY (*)    Hgb urine dipstick SMALL (*)    Bacteria, UA RARE (*)    All other components within normal limits  CBG MONITORING, ED - Abnormal; Notable for the following components:   Glucose-Capillary 135 (*)    All other components within normal limits  URINE CULTURE  COMPREHENSIVE METABOLIC PANEL  CBC WITH DIFFERENTIAL/PLATELET  LAMOTRIGINE LEVEL    EKG None  Radiology CT Head Wo Contrast  Result Date: 10/09/2020 CLINICAL DATA:  Altered mental status.  Altered mental status. EXAM: CT HEAD WITHOUT CONTRAST TECHNIQUE: Contiguous axial images were obtained from the base of the skull through the vertex without intravenous contrast. COMPARISON:  Head CT 10/01/2020. FINDINGS: Brain: No evidence of acute infarction, hemorrhage, hydrocephalus, extra-axial collection or mass lesion/mass effect. Very large, remote infarct MCA and PCA territory infarct on the left noted. There is atrophy and chronic microvascular ischemic change. Vascular: No hyperdense vessel or unexpected calcification. Skull: Intact.  No focal lesion. Sinuses/Orbits: Negative. Other: None. IMPRESSION: No acute abnormality in a patient with a large, remote left MCA and PCA territory infarct. Electronically Signed   By: Drusilla Kanner M.D.   On: 10/09/2020 14:30   DG Chest Portable 1 View  Result Date: 10/09/2020 CLINICAL DATA:  Acute mental status change. EXAM: PORTABLE CHEST 1 VIEW COMPARISON:  October 01, 2020 FINDINGS: A loop recorder device is projected over the left lung base. The heart, hila, mediastinum, lungs, and pleura are otherwise unremarkable. Degenerative changes in the left shoulder. IMPRESSION: No active disease. Electronically Signed   By: Gerome Sam III M.D   On: 10/09/2020 13:42    Procedures Procedures   Medications Ordered in ED Medications - No data to display  ED Course  I have reviewed  the triage vital signs and the nursing notes.  Pertinent labs & imaging results that were available during my care of the patient were reviewed by me and considered in my medical decision making (see chart for details).  Clinical Course as of 10/09/20 1839  Caleen Essex Oct 09, 2020  6606 Bilirubin Urine: NEGATIVE [PB]    Clinical Course User Index [PB] Berneice Heinrich   MDM Rules/Calculators/A&P                          Alert 70 year old male no acute distress, nontoxic-appearing.  Patient is nonverbal and nonambulatory at baseline.  Patient has history of CVA with right-sided hemiplegia and contractures.  Patient's daughter reports that he had 3 episodes where his eyes rolled into the back of his head and he was unresponsive which were followed by patient staring and not following simple commands.  Patient's daughter reports that he has been taking all of his medications as prescribed.  Patient had all of his morning medications today.  On arrival in the Emergency Department patient appears to be at his mental baseline status.  We will obtain urinalysis, CMP, CBC, chest x-ray, EKG, Lamictal level and noncontrast head CT to evaluate for possible other cause of his reported mental status change.  Based on description of events suspect possible breakthrough seizure, however at this time cannot rule out infectious or metabolic source.  Lab work was delayed due to difficult IV access.  Nurse, phlebotomy, attending physician Dr. Wilkie Aye tried without assessed to gain IV access.  Will consult IV team.  Patient care transferred  to PA Geiple at the end of my shift. Patient presentation, ED course, and plan of care discussed with review of all pertinent labs and imaging. Please see his/her note for further details regarding further ED course and disposition.   At time of discharge patient's lab work is pending.  If lab work is unremarkable plan to discharge home in the care of his daughter.   Patient to follow-up with his primary care provider.    Final Clinical Impression(s) / ED Diagnoses Final diagnoses:  None    Rx / DC Orders ED Discharge Orders     None        Haskel Schroeder, PA-C 10/09/20 1812    Rozelle Logan, DO 10/10/20 [phone]

## 2020-10-09 NOTE — ED Triage Notes (Signed)
Patient BIB [facility] for altered mental status. Patient has history of stroke, is baseline nonverbal but family reports patient is able to track them around the room with his eyes. Right arm contracted. EMS states family reported an episode at approximately 0900 this morning where patient's eyes rolled back and his left arm started shaking. Patient alert and following commands at this time.

## 2020-10-09 NOTE — ED Notes (Signed)
Per last RN unable to obtain blood for labs PA notified

## 2020-10-09 NOTE — ED Notes (Signed)
EDP at beside for [place] IV

## 2020-10-10 LAB — URINE CULTURE: Culture: NO GROWTH

## 2020-10-10 NOTE — ED Notes (Signed)
Call daughter [name] [phone] for more additional info on care and health

## 2020-10-10 NOTE — ED Notes (Addendum)
Verified d/c vitals with MD.

## 2020-10-10 NOTE — ED Notes (Signed)
Pt daughter would like a call

## 2020-10-12 LAB — LAMOTRIGINE LEVEL: Lamotrigine Lvl: 10.5 ug/mL (ref 2.0–20.0)

## 2020-10-13 DIAGNOSIS — R1310 Dysphagia, unspecified: Secondary | ICD-10-CM

## 2020-10-13 DIAGNOSIS — G4701 Insomnia due to medical condition: Secondary | ICD-10-CM | POA: Diagnosis not present

## 2020-10-13 DIAGNOSIS — I69351 Hemiplegia and hemiparesis following cerebral infarction affecting right dominant side: Secondary | ICD-10-CM | POA: Diagnosis not present

## 2020-10-13 DIAGNOSIS — I69320 Aphasia following cerebral infarction: Secondary | ICD-10-CM

## 2020-10-13 DIAGNOSIS — R451 Restlessness and agitation: Secondary | ICD-10-CM | POA: Diagnosis not present

## 2020-10-13 DIAGNOSIS — I10 Essential (primary) hypertension: Secondary | ICD-10-CM

## 2020-10-13 DIAGNOSIS — I69318 Other symptoms and signs involving cognitive functions following cerebral infarction: Secondary | ICD-10-CM | POA: Diagnosis not present

## 2020-10-13 DIAGNOSIS — I69391 Dysphagia following cerebral infarction: Secondary | ICD-10-CM

## 2020-10-15 ENCOUNTER — Other Ambulatory Visit: Payer: Self-pay | Admitting: Family

## 2020-10-16 ENCOUNTER — Ambulatory Visit: Payer: Medicare Other | Admitting: Family

## 2020-10-16 ENCOUNTER — Ambulatory Visit (INDEPENDENT_AMBULATORY_CARE_PROVIDER_SITE_OTHER): Payer: Medicare Other

## 2020-10-16 ENCOUNTER — Telehealth: Payer: Self-pay

## 2020-10-16 DIAGNOSIS — I639 Cerebral infarction, unspecified: Secondary | ICD-10-CM

## 2020-10-16 LAB — CUP PACEART REMOTE DEVICE CHECK
Date Time Interrogation Session: 20220616230156
Implantable Pulse Generator Implant Date: 20201106

## 2020-10-16 NOTE — Telephone Encounter (Signed)
Physical Therapist called requesting verbal orders for 2 week 2 and 1 week 2.  Per [employer] standing order, verbal order given. Message will be sent to patient's provider as a FYI.

## 2020-10-16 NOTE — Telephone Encounter (Signed)
Noted  

## 2020-10-21 ENCOUNTER — Other Ambulatory Visit: Payer: Self-pay

## 2020-10-21 ENCOUNTER — Telehealth: Payer: Medicare Other | Admitting: Family

## 2020-10-21 ENCOUNTER — Telehealth: Payer: Self-pay

## 2020-10-21 NOTE — Telephone Encounter (Signed)
noted 

## 2020-10-21 NOTE — Telephone Encounter (Signed)
Called patient at 3:15pm, and 3:44pm and left voicemail. Then called patient again at 3:56pm and daughter answered. She states that she's not at home and will have to reschedule. Patient daughter states that she will call tomorrow to reschedule or we can call her.

## 2020-10-28 ENCOUNTER — Telehealth: Payer: Self-pay

## 2020-10-28 NOTE — Telephone Encounter (Signed)
Okay to give verbal orders?  ?

## 2020-10-28 NOTE — Telephone Encounter (Signed)
Occupational therapiat with [facility] home health would like verbal order to reschedule inial evaluation until week of July 10th. Please advise.  Message routed to Richarda Blade, NP

## 2020-10-29 NOTE — Telephone Encounter (Signed)
Called and gave Will verbal orders that were approved by Richarda Blade, NP.

## 2020-10-30 ENCOUNTER — Other Ambulatory Visit: Payer: Self-pay | Admitting: Adult Health

## 2020-10-30 DIAGNOSIS — R451 Restlessness and agitation: Secondary | ICD-10-CM

## 2020-11-04 ENCOUNTER — Other Ambulatory Visit: Payer: Self-pay | Admitting: Family

## 2020-11-04 ENCOUNTER — Other Ambulatory Visit: Payer: Self-pay | Admitting: Adult Health

## 2020-11-04 DIAGNOSIS — I152 Hypertension secondary to endocrine disorders: Secondary | ICD-10-CM

## 2020-11-04 DIAGNOSIS — G40209 Localization-related (focal) (partial) symptomatic epilepsy and epileptic syndromes with complex partial seizures, not intractable, without status epilepticus: Secondary | ICD-10-CM

## 2020-11-04 DIAGNOSIS — E1142 Type 2 diabetes mellitus with diabetic polyneuropathy: Secondary | ICD-10-CM

## 2020-11-04 DIAGNOSIS — I6990 Unspecified sequelae of unspecified cerebrovascular disease: Secondary | ICD-10-CM

## 2020-11-04 DIAGNOSIS — E1159 Type 2 diabetes mellitus with other circulatory complications: Secondary | ICD-10-CM

## 2020-11-04 NOTE — Progress Notes (Signed)
Carelink Summary Report / Loop Recorder 

## 2020-11-05 ENCOUNTER — Other Ambulatory Visit: Payer: Self-pay

## 2020-11-05 ENCOUNTER — Telehealth: Payer: Self-pay

## 2020-11-05 ENCOUNTER — Encounter: Payer: Self-pay | Admitting: Family

## 2020-11-05 ENCOUNTER — Telehealth (INDEPENDENT_AMBULATORY_CARE_PROVIDER_SITE_OTHER): Payer: Medicare Other | Admitting: Family

## 2020-11-05 DIAGNOSIS — I639 Cerebral infarction, unspecified: Secondary | ICD-10-CM

## 2020-11-05 DIAGNOSIS — G4701 Insomnia due to medical condition: Secondary | ICD-10-CM | POA: Diagnosis not present

## 2020-11-05 DIAGNOSIS — E1142 Type 2 diabetes mellitus with diabetic polyneuropathy: Secondary | ICD-10-CM

## 2020-11-05 DIAGNOSIS — I951 Orthostatic hypotension: Secondary | ICD-10-CM

## 2020-11-05 DIAGNOSIS — I152 Hypertension secondary to endocrine disorders: Secondary | ICD-10-CM

## 2020-11-05 DIAGNOSIS — R451 Restlessness and agitation: Secondary | ICD-10-CM

## 2020-11-05 DIAGNOSIS — G40209 Localization-related (focal) (partial) symptomatic epilepsy and epileptic syndromes with complex partial seizures, not intractable, without status epilepticus: Secondary | ICD-10-CM

## 2020-11-05 DIAGNOSIS — I6990 Unspecified sequelae of unspecified cerebrovascular disease: Secondary | ICD-10-CM

## 2020-11-05 DIAGNOSIS — I700 Atherosclerosis of aorta: Secondary | ICD-10-CM

## 2020-11-05 DIAGNOSIS — E1159 Type 2 diabetes mellitus with other circulatory complications: Secondary | ICD-10-CM

## 2020-11-05 MED ORDER — BACLOFEN 20 MG PO TABS: 20.0000 mg | ORAL_TABLET | Freq: Two times a day (BID) | ORAL | 1 refills | Status: AC

## 2020-11-05 MED ORDER — LAMOTRIGINE 150 MG PO TABS: 150.0000 mg | ORAL_TABLET | Freq: Two times a day (BID) | ORAL | 1 refills | Status: AC

## 2020-11-05 MED ORDER — INSULIN DETEMIR 100 UNIT/ML FLEXPEN: 5.0000 [IU] | PEN_INJECTOR | Freq: Every day | SUBCUTANEOUS | 11 refills | Status: DC

## 2020-11-05 MED ORDER — INDAPAMIDE 1.25 MG PO TABS: ORAL_TABLET | ORAL | 1 refills | Status: DC

## 2020-11-05 NOTE — Patient Instructions (Addendum)
-   Home health Nurse to draw CBC/diff,CMP,TSH,Hgb A1C and Lipid panel   - Administered Seroquel 25 mg tablet one by mouth daily at bedtime for agitation   - check Blood pressure prior to administering blood pressure medication ( Amlodipine,Indapamide and coreg) .Hold if B/p < 100/60 and notify provider's office.

## 2020-11-05 NOTE — Progress Notes (Signed)
This service is provided via telemedicine  No vital signs collected/recorded due to the encounter was a telemedicine visit.   Location of patient (ex: home, work):  Home  Patient consents to a telephone visit: Yes, see telephone visit dated 11/05/20  Location of the provider (ex: office, home):  [facility], Office   Name of any referring provider:  N/A  Names of all persons participating in the telemedicine service and their role in the encounter:  S.Chrae B/CMA, Richarda Blade, NP, [place] (daughter), and Patient   Time spent on call:  18 min with medical assistant     Provider: Gildo Crisco FNP-C  Tyiesha Brackney, Donalee Citrin, NP  Patient Care Team: Kalyiah Saintil, Donalee Citrin, NP as PCP - General (Family Medicine)  Extended Emergency Contact Information Primary Emergency Contact: [facility] Phone: [phone] Mobile Phone: [phone] Relation: Daughter Secondary Emergency Contact: Luffman,Joyce Mobile Phone: [phone] Relation: Friend Preferred language: [place] Interpreter needed? No  Code Status:  Full Code  Goals of care: Advanced Directive information Advanced Directives 11/05/2020  Does Patient Have a Medical Advance Directive? Yes  Type of Advance Directive Healthcare Power of Attorney  Does patient want to make changes to medical advance directive? No - Patient declined  Copy of Healthcare Power of Attorney in Chart? No - copy requested  Would patient like information on creating a medical advance directive? -     Chief Complaint  Patient presents with   Acute Visit    Sleep issues, medication review and refills. Refill Glipizide, indapamide, and discuss changing Levemir to the pen versus vial. Patient out of several medications times several weeks. Discuss Tradjenta, patient was never able to pick-up. Discuss seroquel, daughter did not give to patient due to potential side effects. Daughter stopped flomax x 1 year ago, FYI, felt  like patient did not need. Discuss why patient is on midodrine.     HPI:  Pt is a 70 y.o. male seen today for an acute visit for evaluation of sleep issues,medication review and refill glipizide,indapamide and Baclofen.Has been out of medication for several weeks.Patient's daughter provides HPI information.patient lying in bed nonverbal but waved to provider on Mychart video.  Daughter states not aware patient was on Tradjenta so has never pick up or given to patient.  She would like Levemir switched to pen instead of vial.Has also been using her mother's glucometer and supplies to check patient's blood sugars.patient's glucometer not working has had it for several years.request glucometer and supplies to be ordered.supposed to check CBG before meals and at bedtime. CBG readings ranging in the 120's - 170's  Appetite has been good.eats everything that is served on his plate.   Seroquel ordered from the hospital but daughter did not give states due to potential side effect.states patient has been agitated especially during the night when the lights are switched off has been yelling out loudly until 4 am.sometimes yells also during the day and gets angry.  Tends to sleep most of the day. Daughter states afraid to get him out of the bed to sit on the chair because blood pressure drops then passes out. Usually gets up to use the bedside commode then back to bed.   Also daughter stopped flomax x 1 year ago.felt like patient did not need it.  Would like to discuss why patient is on Midodrine.states patient's blood pressure runs low in the 90's / 60's.  He is status post ED visit 10/09/2020 after daughter reported that patient had  3 episodes where his eyes rolled into the back of his head and was unresponsive then stared and was not following any simple commands.Labs, CXR and EKG were unremarkable.CT w/o contrast showed no acute abnormality had a large remote left MCA and PCA territory infarct from  previous CVA.Atrophy and chronic microvascular ischemic change noted.He was in stable condition breakthrough seizure activity was suspected  Aortic Atherosclerosis noted on CXR.   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   Renal insufficiency 04/04/2018   Seizures (HCC)    Stroke (HCC)    UTI (urinary tract infection) 03/05/2019   Past Surgical History:  Procedure Laterality Date   IR PATIENT EVAL TECH 0-60 MINS  03/30/2018   LOOP RECORDER INSERTION N/A 03/08/2019   Procedure: LOOP RECORDER INSERTION;  Surgeon: Regan Lemming, MD;  Location: [facility] INVASIVE CV LAB;  Service: Cardiovascular;  Laterality: N/A;   TEE WITHOUT CARDIOVERSION N/A 04/02/2018   Procedure: TRANSESOPHAGEAL ECHOCARDIOGRAM (TEE);  Surgeon: [name], MD;  Location: [facility] ENDOSCOPY;  Service: Cardiovascular;  Laterality: N/A;    Allergies  Allergen Reactions   Ciprofloxacin Anaphylaxis   Keppra [Levetiracetam] Other (See Comments)    Makes the patient very agitated and "not like himself"   Avocado Nausea And Vomiting   Metformin And Related Other (See Comments)    "Renal issue"    Outpatient Encounter Medications as of 11/05/2020  Medication Sig   amLODipine (NORVASC) 10 MG tablet Take 1 tablet (10 mg total) by mouth daily.   ascorbic acid (VITAMIN C) 500 MG tablet Take 1 tablet (500 mg total) by mouth daily as needed (if cold-like symptoms are present).   aspirin EC 325 MG tablet Take 325 mg by mouth every morning.    atorvastatin (LIPITOR) 40 MG tablet Take 1 tablet (40 mg total) by mouth daily at 6 (six) AM.   baclofen (LIORESAL) 20 MG tablet TAKE 1 TABLET BY MOUTH TWICE A DAY   BD PEN NEEDLE NANO U/F 32G X 4 MM MISC TAKE AS DIRECTED   carvedilol (COREG) 12.5 MG tablet Take 1 tablet (12.5 mg total) by mouth 2 (two) times daily with a meal. WILL NEED APPT FOR FUTURE REFILLS   ciclopirox (PENLAC) 8 % solution Apply topically at bedtime. Apply over nail and surrounding skin. Apply daily over previous coat. After seven (7) days, may remove with alcohol and continue cycle.   clopidogrel (PLAVIX) 75 MG tablet Take 1 tablet (75 mg total) by mouth daily.   diphenhydrAMINE HCl, Sleep, (NIGHT TIME SLEEP AID PO) Take 1 tablet by mouth daily. Has melatonin in it   glipiZIDE (GLUCOTROL) 10 MG tablet TAKE 1 TABLET TWICE A DAY WITH A MEAL. NEED APPT   indapamide (LOZOL) 1.25 MG tablet TAKE 1 TABLET BY MOUTH DAILY. NEEDS AN APPOINTMENT BEFORE ANYMORE FUTURE REFILL.   insulin detemir (LEVEMIR) 100 UNIT/ML injection Inject 0.05 mLs (5 Units total) into  the skin daily. If CBG >125 (per daughter)   ketoconazole (NIZORAL) 2 % cream Apply 1 application topically daily.   lamoTRIgine (LAMICTAL) 150 MG tablet TAKE 1 TABLET BY MOUTH TWICE A DAY   liver oil-zinc oxide (DESITIN) 40 % ointment Apply 1 application topically 2 (two) times daily as needed for irritation.   midodrine (PROAMATINE) 5 MG tablet TAKE 1 TAB IN AM, NOON, & BEDTIME FOR ORTHOSTATIC HYPOTENSION NEEDS APPOINTMENT BEFORE MORE REFILLS   NOVOLOG FLEXPEN 100 UNIT/ML FlexPen INJECT 2-15 UNITS INTO THE SKIN 4 TIMES DAILY - BEFORE MEALS AND AT BEDTIME. SSI: 121-150 = 2 UNITS 151-200 = 3 UNITS, 201-250 = 5 UNITS, 251-300 = 8 UNITS, 301-350 = 11 UNITS, 351-300 = 15 UNITS, GREATER THAN 400 = 15 UNITS AND CALL MD   QUEtiapine (SEROQUEL) 25 MG tablet Take 1 tablet (25 mg total) by mouth at bedtime.   risperiDONE (RISPERDAL) 2 MG tablet Take 2 mg by mouth at bedtime.    senna-docusate (SENOKOT-S) 8.6-50 MG tablet Take 1 tablet by mouth at bedtime as needed for mild constipation.   traZODone (DESYREL) 50 MG tablet Take 1 tablet (50 mg total) by mouth at bedtime.   Vitamin D3 (VITAMIN D) 25 MCG tablet Take 0.5 tablets (500 Units total) by mouth daily.   linagliptin (TRADJENTA) 5 MG TABS tablet Take 1 tablet (5 mg total) by mouth daily. (Patient not taking: No sig reported)   [DISCONTINUED] tamsulosin (FLOMAX) 0.4 MG CAPS capsule Take 1 capsule (0.4 mg total) by mouth daily after supper. (Patient not taking: Reported on 11/05/2020)   No facility-administered encounter medications on file as of 11/05/2020.    Review of Systems  Unable to perform ROS: Patient nonverbal (Information provided by patient's daughter during the visit)  Constitutional:  Negative for appetite change, chills, fatigue, fever and unexpected weight change.  HENT:  Negative for congestion, dental problem, ear discharge, ear pain, facial swelling, hearing loss, nosebleeds, postnasal drip, rhinorrhea, sinus pressure, sinus pain, sneezing, sore throat, tinnitus and trouble swallowing.   Eyes:  Negative for pain, discharge, redness, itching and visual disturbance.  Respiratory:  Negative for cough, chest tightness, shortness of breath and wheezing.   Cardiovascular:  Negative for chest pain, palpitations and leg swelling.  Gastrointestinal:  Negative for abdominal distention, abdominal pain, blood in stool, constipation, diarrhea, nausea and vomiting.  Endocrine: Negative for cold intolerance, heat intolerance, polydipsia, polyphagia and polyuria.  Genitourinary:  Negative for difficulty urinating, dysuria, flank pain, frequency and urgency.  Musculoskeletal:  Positive for gait problem. Negative for arthralgias, back pain, joint swelling, myalgias, neck pain and neck stiffness.  Skin:  Negative for color change, pallor, rash and wound.  Neurological:  Positive for weakness. Negative for dizziness,  syncope, speech difficulty, light-headedness, numbness and headaches.       Left side weakness   Hematological:  Does not bruise/bleed easily.  Psychiatric/Behavioral:  Positive for agitation and sleep disturbance. Negative for confusion, hallucinations, self-injury and suicidal ideas. The patient is not nervous/anxious.    Immunization History  Administered Date(s) Administered   Influenza-Unspecified 01/01/2019   PFIZER(Purple Top)SARS-COV-2 Vaccination 04/22/2020   Pneumococcal-Unspecified 12/31/2017   Unspecified SARS-COV-2 Vaccination 12/02/2019   Pertinent  Health Maintenance Due  Topic Date Due   FOOT EXAM  Never done   OPHTHALMOLOGY EXAM  Never done   URINE MICROALBUMIN  Never done   COLONOSCOPY (Pts 45-3452yrs Insurance coverage will need to be confirmed)  Never done   PNA vac Low Risk Adult (2 of 2 -  PCV13) 01/01/2019   INFLUENZA VACCINE  11/30/2020   HEMOGLOBIN A1C  03/09/2021   Fall Risk  11/05/2020 10/08/2020 03/06/2020  Falls in the past year? 0 0 0  Number falls in past yr: 0 0 0  Injury with Fall? 0 0 0   Functional Status Survey:    There were no vitals filed for this visit. There is no height or weight on file to calculate BMI. Physical Exam Constitutional:      General: He is not in acute distress.    Appearance: He is not ill-appearing.     Comments: nonverbal  Pulmonary:     Effort: Pulmonary effort is normal.  Neurological:     Mental Status: He is alert. Mental status is at baseline.     Gait: Gait abnormal.     Comments: Left side weakness  Psychiatric:     Comments: Calm during visit     Labs reviewed: Recent Labs    04/24/20 0236 04/26/20 0228 05/21/20 1705 08/09/20 1703 09/06/20 1434 09/09/20 0221 10/01/20 1146 10/09/20 1300  NA 140 139   < > 144   < > 136 141 141  K 3.5 3.4*   < > 2.9*   < > 3.7 3.2* 3.7  CL 105 101   < > 114*   < > 102 103 101  CO2 25 27   < > 26   < > 27 29 31   GLUCOSE 188* 232*   < > 89   < > 219* 143* 130*  BUN  27* 17   < > 15   < > 14 22 29*  CREATININE 1.67* 1.10   < > 0.88   < > 1.19 1.20 1.55*  CALCIUM 9.5 9.1   < > 7.4*   < > 9.0 10.0 9.7  MG 2.1 1.7  --  1.6*  --   --   --   --    < > = values in this interval not displayed.   Recent Labs    08/09/20 1703 10/01/20 1146 10/09/20 1300  AST 16 25 19   ALT 32 34 33  ALKPHOS 60 82 77  BILITOT 0.8 1.5* 0.8  PROT 4.9* 7.3 6.7  ALBUMIN 2.8* 4.3 3.8   Recent Labs    09/06/20 1434 09/07/20 0430 10/01/20 1146 10/09/20 1300  WBC 5.7 6.2 5.6 5.9  NEUTROABS 3.6  --  3.8 4.0  HGB 14.7 12.6* 13.4 12.3*  HCT 46.4 39.0 42.3 37.5*  MCV 93.7 92.6 93.0 93.1  PLT 212 212 250 201   Lab Results  Component Value Date   TSH 1.356 04/22/2020   Lab Results  Component Value Date   HGBA1C 6.8 (H) 09/06/2020   Lab Results  Component Value Date   CHOL 101 04/22/2020   HDL 34 (L) 04/22/2020   LDLCALC 59 04/22/2020   TRIG 40 04/22/2020   CHOLHDL 3.0 04/22/2020    Significant Diagnostic Results in last 30 days:  CT Head Wo Contrast  Result Date: 10/09/2020 CLINICAL DATA:  Altered mental status.  Altered mental status. EXAM: CT HEAD WITHOUT CONTRAST TECHNIQUE: Contiguous axial images were obtained from the base of the skull through the vertex without intravenous contrast. COMPARISON:  Head CT 10/01/2020. FINDINGS: Brain: No evidence of acute infarction, hemorrhage, hydrocephalus, extra-axial collection or mass lesion/mass effect. Very large, remote infarct MCA and PCA territory infarct on the left noted. There is atrophy and chronic microvascular ischemic change. Vascular: No hyperdense vessel or unexpected calcification. Skull:  Intact.  No focal lesion. Sinuses/Orbits: Negative. Other: None. IMPRESSION: No acute abnormality in a patient with a large, remote left MCA and PCA territory infarct. Electronically Signed   By: Drusilla Kanner M.D.   On: 10/09/2020 14:30   DG Chest Portable 1 View  Result Date: 10/09/2020 CLINICAL DATA:  Acute mental  status change. EXAM: PORTABLE CHEST 1 VIEW COMPARISON:  October 01, 2020 FINDINGS: A loop recorder device is projected over the left lung base. The heart, hila, mediastinum, lungs, and pleura are otherwise unremarkable. Degenerative changes in the left shoulder. IMPRESSION: No active disease. Electronically Signed   By: Gerome Sam III M.D   On: 10/09/2020 13:42   CUP PACEART REMOTE DEVICE CHECK  Result Date: 10/16/2020 ILR summary report received. Battery status OK. Normal device function. No new symptom, tachy, brady, or pause episodes. No new AF episodes. Monthly summary reports and ROV/PRN. HB   Assessment/Plan 1. Hypertension associated with diabetes (HCC) Low soft B/p readings reported. Daughter advised to check blood pressure prior to giving medication if < 100/60 to hold Amlodipine,Indapamide and coreg and Notify provider.  - indapamide (LOZOL) 1.25 MG tablet; TAKE 1 TABLET BY MOUTH DAILY. NEEDS AN APPOINTMENT BEFORE ANYMORE FUTURE REFILL.  Dispense: 90 tablet; Refill: 1 - For home use only DME Other see comment:Home health Nurse to draw labs : CBC/diff,CMP,TSH,Hgb A1C and Lipid panel   2. Partial symptomatic epilepsy with complex partial seizures, not intractable, without status epilepticus (HCC) No recent seizure activity but pass out due to low blood pressure.  - lamoTRIgine (LAMICTAL) 150 MG tablet; Take 1 tablet (150 mg total) by mouth 2 (two) times daily.  Dispense: 180 tablet; Refill: 1  3. Type 2 diabetes mellitus with peripheral neuropathy (HCC) Lab Results  Component Value Date   HGBA1C 6.8 (H) 09/06/2020  CBG controlled  Continue on Levemir,Humalog and glipizide.Not taking tradjenta  Requires glucometer,lacets and strips. - insulin detemir (LEVEMIR) 100 UNIT/ML FlexPen; Inject 5 Units into the skin daily.  Dispense: 15 mL; Refill: 11 - For home use only DME Other see comment: CBC/diff,CMP,TSH,Hgb A1C and Lipid panel   4. Insomnia due to medical condition Advised  daughter to administer Seroquel 25 mg tablet as prescribed from the Hospital for agitation. - continue on Trazodone   5. Delayed orthostatic hypotension Continue on midodrine   6. Late effects of CVA (cerebrovascular accident) Continue to control high risk factors. - baclofen (LIORESAL) 20 MG tablet; Take 1 tablet (20 mg total) by mouth 2 (two) times daily.  Dispense: 180 tablet; Refill: 1 - For home use only DME Other see comment: Home health Nurse to draw CBC/diff,CMP,TSH,Hgb A1C and Lipid panel   7. Agitation Has not used Seroquel daughter was afraid of side effects.Patient yells loudly at night and sometimes during the day.daughter advised to start of Seroquel to help with agitation.will rule out other acute or metabolic causes.HH[facility] nurse to draw labs as below.  - For home use only DME Other see comment:CBC/diff,CMP,TSH,Hgb A1C and Lipid panel   8. Aortic atherosclerosis (HCC) Per chest X-ray done in ED 10/01/2020  Continue on clopidogrel ,ASA and atorvastatin.    Family/ staff Communication: Reviewed plan of care with patient and daughter verbalized understanding patient non-verbal.   Labs/tests ordered: Home health Nurse to draw CBC/diff,CMP,TSH,Hgb A1C and Lipid panel orders given to Synetta Fail May,CMA to fax to [facility]   Next Appointment: 5 months for medical management of chronic issues.  I connected with  [name]. on  11/05/20 by a video enabled telemedicine application and verified that I am speaking with the correct person using two identifiers.   I discussed the limitations of evaluation and management by telemedicine. The patient expressed understanding and agreed to proceed.  Spent 25 minutes of Face to face with patient and daughter    Caesar Bookman, NP

## 2020-11-05 NOTE — Telephone Encounter (Signed)
[name], Douglas [name] are scheduled for a virtual visit with your provider today.    Just as we do with appointments in the office, we must obtain your consent to participate.  Your consent will be active for this visit and any virtual visit you may have with one of our providers in the next 365 days.    If you have a MyChart account, I can also send a copy of this consent to you electronically.  All virtual visits are billed to your insurance company just like a traditional visit in the office.  As this is a virtual visit, video technology does not allow for your provider to perform a traditional examination.  This may limit your provider's ability to fully assess your condition.  If your provider identifies any concerns that need to be evaluated in person or the need to arrange testing such as labs, EKG, etc, we will make arrangements to do so.    Although advances in technology are sophisticated, we cannot ensure that it will always work on either your end or our end.  If the connection with a video visit is poor, we may have to switch to a telephone visit.  With either a video or telephone visit, we are not always able to ensure that we have a secure connection.   I need to obtain your verbal consent now.   Are you willing to proceed with your visit today?   Douglas [name]. And Douglas [name] (daughter) has provided verbal consent on 11/05/2020 for a virtual visit (video or telephone).   Douglas [name] [place], [place] 11/05/2020  1:19 PM

## 2020-11-06 ENCOUNTER — Telehealth: Payer: Self-pay | Admitting: *Deleted

## 2020-11-06 NOTE — Telephone Encounter (Signed)
Dinah brought an order to my desk requesting Home Health to draw labs on patient.   Labs: CBC/diff, CMP, TSH, Lipid Dx: I69.90, E11.59, I15.2, R45.1  Called and left message for [facility] Nurse to return call.  Nino Glow [phone])

## 2020-11-06 NOTE — Telephone Encounter (Signed)
Spoke with Nurse, Nino Glow and she stated to call office and give orders.  Called [phone] and spoke with Leotis Shames and gave orders to draw requested labs.

## 2020-11-06 NOTE — Telephone Encounter (Signed)
LMOM for [facility] nurse to return call.

## 2020-11-10 LAB — HEPATIC FUNCTION PANEL
ALT: 26 (ref 10–40)
AST: 21 (ref 14–40)
Alkaline Phosphatase: 103 (ref 25–125)
Bilirubin, Total: 0.7

## 2020-11-10 LAB — BASIC METABOLIC PANEL
BUN: 19 (ref 4–21)
CO2: 23 — AB (ref 13–22)
Chloride: 96 — AB (ref 99–108)
Creatinine: 0.9 (ref 0.6–1.3)
Glucose: 116
Potassium: 4.4 (ref 3.4–5.3)
Sodium: 136 — AB (ref 137–147)

## 2020-11-10 LAB — COMPREHENSIVE METABOLIC PANEL: Calcium: 9.7 (ref 8.7–10.7)

## 2020-11-10 LAB — CBC: RBC: 4.31 (ref 3.87–5.11)

## 2020-11-10 LAB — LIPID PANEL: Cholesterol: 119 (ref 0–200)

## 2020-11-10 LAB — TSH: TSH: 1.36 (ref 0.41–5.90)

## 2020-11-11 ENCOUNTER — Encounter: Payer: Self-pay | Admitting: *Deleted

## 2020-11-11 ENCOUNTER — Telehealth: Payer: Self-pay | Admitting: Family

## 2020-11-11 LAB — CBC AND DIFFERENTIAL
HCT: 38 — AB (ref 41–53)
Hemoglobin: 13.1 — AB (ref 13.5–17.5)
Neutrophils Absolute: 2.9
Platelets: 224 (ref 150–399)
WBC: 4.8

## 2020-11-11 NOTE — Telephone Encounter (Signed)
Called patient and no answer. Voicemail was left with office call back number.   

## 2020-11-11 NOTE — Telephone Encounter (Signed)
Copy of labs received from [facility].All lab results are normal except glucose slightly high though this is expected if non-fasting labs.

## 2020-11-13 ENCOUNTER — Telehealth: Payer: Self-pay | Admitting: *Deleted

## 2020-11-13 NOTE — Telephone Encounter (Signed)
Will with [facility] called requesting verbal orders for OT 1x3wks.   Verbal orders given.

## 2020-11-14 ENCOUNTER — Other Ambulatory Visit: Payer: Self-pay | Admitting: Family

## 2020-11-14 DIAGNOSIS — E1142 Type 2 diabetes mellitus with diabetic polyneuropathy: Secondary | ICD-10-CM

## 2020-11-16 ENCOUNTER — Ambulatory Visit (INDEPENDENT_AMBULATORY_CARE_PROVIDER_SITE_OTHER): Payer: Medicare Other

## 2020-11-16 DIAGNOSIS — Z8673 Personal history of transient ischemic attack (TIA), and cerebral infarction without residual deficits: Secondary | ICD-10-CM

## 2020-11-18 LAB — CUP PACEART REMOTE DEVICE CHECK
Date Time Interrogation Session: 20220719230712
Implantable Pulse Generator Implant Date: 20201106

## 2020-11-19 ENCOUNTER — Telehealth: Payer: Self-pay

## 2020-11-19 NOTE — Telephone Encounter (Signed)
Noted  

## 2020-11-19 NOTE — Telephone Encounter (Signed)
Called [facility] and asked them about missing HDL, LDL, TRG from Lipid Panel. [facility] worker states that requisition was sent from [facility] office and only Cholesterol was marked. [facility] worker states that Lipid Panel was never selected only Cholesterol. I was also informed that it was too late to add on missing labs LDL, HDL, and TRG. Message routed back to PCP Ngetich, Dinah C, NP .

## 2020-11-20 ENCOUNTER — Telehealth: Payer: Self-pay | Admitting: *Deleted

## 2020-11-20 NOTE — Telephone Encounter (Signed)
Mark with [facility] called and stated that patient is being seen for PT and OT. Stated that he saw patient today and patient was complaining of Right upper and Lower Extremity pain.  [name] is wondering if you could prescribe patient Nerve Pain Medication to help relive the pain.   Please Advise.

## 2020-11-20 NOTE — Telephone Encounter (Signed)
Mark with Home Health Notified and agreed. He will notify family.

## 2020-11-20 NOTE — Telephone Encounter (Signed)
Will need an appointment to evaluate leg pain

## 2020-11-22 ENCOUNTER — Encounter (HOSPITAL_COMMUNITY): Payer: Self-pay | Admitting: *Deleted

## 2020-11-22 ENCOUNTER — Emergency Department (HOSPITAL_COMMUNITY)
Admission: EM | Admit: 2020-11-22 | Discharge: 2020-11-22 | Disposition: A | Discharge status: Home / Self Care | Payer: Medicare Other | Attending: Emergency Medicine | Admitting: Emergency Medicine

## 2020-11-22 ENCOUNTER — Other Ambulatory Visit: Payer: Self-pay

## 2020-11-22 DIAGNOSIS — Z5321 Procedure and treatment not carried out due to patient leaving prior to being seen by health care provider: Secondary | ICD-10-CM | POA: Diagnosis not present

## 2020-11-22 DIAGNOSIS — R4182 Altered mental status, unspecified: Secondary | ICD-10-CM | POA: Insufficient documentation

## 2020-11-22 LAB — CBC
HCT: 41.4 % (ref 39.0–52.0)
Hemoglobin: 13.6 g/dL (ref 13.0–17.0)
MCH: 30.8 pg (ref 26.0–34.0)
MCHC: 32.9 g/dL (ref 30.0–36.0)
MCV: 93.7 fL (ref 80.0–100.0)
Platelets: 217 10*3/uL (ref 150–400)
RBC: 4.42 MIL/uL (ref 4.22–5.81)
RDW: 13.2 % (ref 11.5–15.5)
WBC: 5.1 10*3/uL (ref 4.0–10.5)
nRBC: 0 % (ref 0.0–0.2)

## 2020-11-22 LAB — COMPREHENSIVE METABOLIC PANEL
ALT: 29 U/L (ref 0–44)
AST: 24 U/L (ref 15–41)
Albumin: 3.8 g/dL (ref 3.5–5.0)
Alkaline Phosphatase: 81 U/L (ref 38–126)
Anion gap: 10 (ref 5–15)
BUN: 23 mg/dL (ref 8–23)
CO2: 28 mmol/L (ref 22–32)
Calcium: 9.6 mg/dL (ref 8.9–10.3)
Chloride: 98 mmol/L (ref 98–111)
Creatinine, Ser: 1.14 mg/dL (ref 0.61–1.24)
GFR, Estimated: >60 mL/min (ref >60)
Glucose, Bld: 127 mg/dL — ABNORMAL HIGH (ref 70–99)
Potassium: 3.3 mmol/L — ABNORMAL LOW (ref 3.5–5.1)
Sodium: 136 mmol/L (ref 135–145)
Total Bilirubin: 1 mg/dL (ref 0.3–1.2)
Total Protein: 6.9 g/dL (ref 6.5–8.1)

## 2020-11-22 NOTE — ED Triage Notes (Signed)
Pt from home with EMS for AMS. At baseline, pt can state yes and able to feed himself. This morning pt was able to feed himself, but tonight unable to. Family requesting eval for increased AMS. Hx of stroke with L sided deficits

## 2020-11-22 NOTE — ED Notes (Signed)
Daughter took patient home POV. Will return if symptoms return or worsen.

## 2020-11-23 ENCOUNTER — Emergency Department (HOSPITAL_COMMUNITY): Payer: Medicare Other

## 2020-11-23 ENCOUNTER — Emergency Department (HOSPITAL_COMMUNITY)
Admission: EM | Admit: 2020-11-23 | Discharge: 2020-11-24 | Disposition: A | Discharge status: Home / Self Care | Payer: Medicare Other | Attending: Student | Admitting: Student

## 2020-11-23 ENCOUNTER — Encounter (HOSPITAL_COMMUNITY): Payer: Self-pay | Admitting: Emergency Medicine

## 2020-11-23 ENCOUNTER — Other Ambulatory Visit: Payer: Self-pay

## 2020-11-23 DIAGNOSIS — Z5321 Procedure and treatment not carried out due to patient leaving prior to being seen by health care provider: Secondary | ICD-10-CM | POA: Diagnosis not present

## 2020-11-23 DIAGNOSIS — R531 Weakness: Secondary | ICD-10-CM | POA: Diagnosis present

## 2020-11-23 DIAGNOSIS — R5383 Other fatigue: Secondary | ICD-10-CM | POA: Diagnosis not present

## 2020-11-23 LAB — CBC WITH DIFFERENTIAL/PLATELET
Abs Immature Granulocytes: 0.01 10*3/uL (ref 0.00–0.07)
Basophils Absolute: 0 10*3/uL (ref 0.0–0.1)
Basophils Relative: 0 %
Eosinophils Absolute: 0.1 10*3/uL (ref 0.0–0.5)
Eosinophils Relative: 2 %
HCT: 40.7 % (ref 39.0–52.0)
Hemoglobin: 13.4 g/dL (ref 13.0–17.0)
Immature Granulocytes: 0 %
Lymphocytes Relative: 28 %
Lymphs Abs: 1.3 10*3/uL (ref 0.7–4.0)
MCH: 30.9 pg (ref 26.0–34.0)
MCHC: 32.9 g/dL (ref 30.0–36.0)
MCV: 93.8 fL (ref 80.0–100.0)
Monocytes Absolute: 0.4 10*3/uL (ref 0.1–1.0)
Monocytes Relative: 10 %
Neutro Abs: 2.7 10*3/uL (ref 1.7–7.7)
Neutrophils Relative %: 60 %
Platelets: 212 10*3/uL (ref 150–400)
RBC: 4.34 MIL/uL (ref 4.22–5.81)
RDW: 13.2 % (ref 11.5–15.5)
WBC: 4.5 10*3/uL (ref 4.0–10.5)
nRBC: 0 % (ref 0.0–0.2)

## 2020-11-23 LAB — COMPREHENSIVE METABOLIC PANEL
ALT: 27 U/L (ref 0–44)
AST: 20 U/L (ref 15–41)
Albumin: 3.8 g/dL (ref 3.5–5.0)
Alkaline Phosphatase: 79 U/L (ref 38–126)
Anion gap: 10 (ref 5–15)
BUN: 24 mg/dL — ABNORMAL HIGH (ref 8–23)
CO2: 28 mmol/L (ref 22–32)
Calcium: 9.7 mg/dL (ref 8.9–10.3)
Chloride: 100 mmol/L (ref 98–111)
Creatinine, Ser: 1.2 mg/dL (ref 0.61–1.24)
GFR, Estimated: >60 mL/min (ref >60)
Glucose, Bld: 135 mg/dL — ABNORMAL HIGH (ref 70–99)
Potassium: 3.3 mmol/L — ABNORMAL LOW (ref 3.5–5.1)
Sodium: 138 mmol/L (ref 135–145)
Total Bilirubin: 0.9 mg/dL (ref 0.3–1.2)
Total Protein: 6.9 g/dL (ref 6.5–8.1)

## 2020-11-23 NOTE — ED Notes (Signed)
Pt unable to sign MSE waiver.  

## 2020-11-23 NOTE — ED Notes (Signed)
Patients daughter states she checked mychart and all the tests she was concerned about are ok with her. Requesting [place] to help patient get into car to leave.

## 2020-11-23 NOTE — ED Triage Notes (Signed)
Pt BIB [facility] from home, hx stroke, at baseline pt answer yes/no. Pt reports increased weakness and generalized fatigue.

## 2020-11-23 NOTE — ED Provider Notes (Signed)
Emergency Medicine Provider Triage Evaluation Note  Douglas [name]. , a 70 y.o. male  was evaluated in triage.  Patient brought in by EMS from home.  Daughter states that he was doing fine this morning, at lunch here became verbally unresponsive, was awake and sitting upright, however was not responding for about 20 minutes and had a blank facial expression daughter states that she also noticed some weakness in the left side, does have history of stroke and is always weak on the right side.  No other new focal deficits.  Back to baseline now.  Review of Systems  Positive: weakness Negative:   Physical Exam  BP 121/79 (BP Location: Left Arm)   Pulse 75   Temp 98 F (36.7 C)   Resp 16   SpO2 100%  Gen:   Bedbbound Resp:  Normal effort Neuro:  At baseline per daughter  Medical Decision Making  Medically screening exam initiated at 4:28 PM.  Appropriate orders placed.  Douglas [name]. was informed that the remainder of the evaluation will be completed by another provider, this initial triage assessment does not replace that evaluation, and the importance of remaining in the ED until their evaluation is complete.     Farrel Gordon, PA-C 11/23/20 1631    Virgina Norfolk, DO 11/23/20 2324

## 2020-11-24 DIAGNOSIS — R531 Weakness: Secondary | ICD-10-CM | POA: Diagnosis not present

## 2020-11-24 MED ORDER — LAMOTRIGINE 150 MG PO TABS
150.0000 mg | ORAL_TABLET | Freq: Two times a day (BID) | ORAL | Status: DC
  Administered 2020-11-24: 150 mg via ORAL
  Filled 2020-11-24 (×2): qty 1

## 2020-11-24 NOTE — ED Provider Notes (Signed)
Discussed with daughter. Pt normally answers yes/no questions, but has been unable to do so since this AM.  She reports increasing weakness over the last 2 days including inability to assist with transfer from bed to wheelchair and inability to feed himself.  Daughter reports hx of CVA with some aphasia and right sided weakness. Long discussion with daughter who is willing to have patient stay for continued work-up.   Haleemah Buckalew, Boyd Kerbs 11/24/20 0124    Glynn Octave, MD 11/24/20 0201

## 2020-11-24 NOTE — ED Notes (Signed)
[name] visiting patient in triage and has brought him breakfast. Daughter states patient is back close to his baseline and would like to just take him home. EMT and RN assisted patient into wheelchair and wheeled out to car.

## 2020-11-24 NOTE — ED Notes (Signed)
Patient inc of stool and urine. Condom cath applied to patient and patient cleaned up and new brief put on patient.

## 2020-11-25 ENCOUNTER — Telehealth: Payer: Self-pay | Admitting: *Deleted

## 2020-11-25 LAB — LAMOTRIGINE LEVEL: Lamotrigine Lvl: 10.3 ug/mL (ref 2.0–20.0)

## 2020-11-25 NOTE — Telephone Encounter (Signed)
Noted  

## 2020-11-25 NOTE — Telephone Encounter (Signed)
FYI  Tammy with [facility] called and stated that she just wanted to let you know that patient's Right shoulder looks like it is Subluxed interiorly.   Daughter states this is not a new problem.  No Complaints.   Nurse is just wanting to make you aware.

## 2020-11-29 ENCOUNTER — Other Ambulatory Visit: Payer: Self-pay | Admitting: Adult Health

## 2020-11-29 DIAGNOSIS — R451 Restlessness and agitation: Secondary | ICD-10-CM

## 2020-12-01 ENCOUNTER — Telehealth: Payer: Self-pay | Admitting: *Deleted

## 2020-12-01 DIAGNOSIS — M25511 Pain in right shoulder: Secondary | ICD-10-CM

## 2020-12-01 NOTE — Telephone Encounter (Signed)
Order send to [facility] on [facility] [address]

## 2020-12-01 NOTE — Telephone Encounter (Signed)
Michael with [facility] called and stated that she is seeing patient today and she thinks patient has what looks like a Interior Dislocation of the Right Shoulder at least 2 fingers apart.   Requesting a verbal order for Mobile X-Ray.   Please Advise.

## 2020-12-01 NOTE — Telephone Encounter (Signed)
Nurse wants [place] to place the order for patient to go to [facility].   Pended order for approval.

## 2020-12-01 NOTE — Telephone Encounter (Signed)
Obtain a portable right shoulder X-ray 2-3 views to rule out fracture or dislocation

## 2020-12-05 ENCOUNTER — Other Ambulatory Visit: Payer: Self-pay | Admitting: Adult Health

## 2020-12-05 DIAGNOSIS — R451 Restlessness and agitation: Secondary | ICD-10-CM

## 2020-12-09 NOTE — Progress Notes (Signed)
Carelink Summary Report / Loop Recorder 

## 2020-12-12 DIAGNOSIS — I69320 Aphasia following cerebral infarction: Secondary | ICD-10-CM | POA: Diagnosis not present

## 2020-12-12 DIAGNOSIS — I69391 Dysphagia following cerebral infarction: Secondary | ICD-10-CM | POA: Diagnosis not present

## 2020-12-12 DIAGNOSIS — R451 Restlessness and agitation: Secondary | ICD-10-CM

## 2020-12-12 DIAGNOSIS — I69351 Hemiplegia and hemiparesis following cerebral infarction affecting right dominant side: Secondary | ICD-10-CM

## 2020-12-12 DIAGNOSIS — I69318 Other symptoms and signs involving cognitive functions following cerebral infarction: Secondary | ICD-10-CM | POA: Diagnosis not present

## 2020-12-12 DIAGNOSIS — E1159 Type 2 diabetes mellitus with other circulatory complications: Secondary | ICD-10-CM

## 2020-12-12 DIAGNOSIS — L89616 Pressure-induced deep tissue damage of right heel: Secondary | ICD-10-CM

## 2020-12-12 DIAGNOSIS — R1312 Dysphagia, oropharyngeal phase: Secondary | ICD-10-CM | POA: Diagnosis not present

## 2020-12-17 ENCOUNTER — Ambulatory Visit (INDEPENDENT_AMBULATORY_CARE_PROVIDER_SITE_OTHER): Payer: Medicare Other

## 2020-12-17 ENCOUNTER — Telehealth: Payer: Medicare Other

## 2020-12-17 DIAGNOSIS — Z8673 Personal history of transient ischemic attack (TIA), and cerebral infarction without residual deficits: Secondary | ICD-10-CM

## 2020-12-17 NOTE — Telephone Encounter (Signed)
Marisue Ivan, RN with [facility] called stating that patient is seeing therapy and they ordered for a nurse to come out for wound on right heel. Marisue Ivan called wanting orders for foam dressing change twice a week. Please advise.  Message sent to Richarda Blade, NP

## 2020-12-17 NOTE — Telephone Encounter (Signed)
May given verbal orders for wound care.Nurse to send measurement of wound to provider.

## 2020-12-18 ENCOUNTER — Other Ambulatory Visit: Payer: Self-pay | Admitting: Family

## 2020-12-18 DIAGNOSIS — E1169 Type 2 diabetes mellitus with other specified complication: Secondary | ICD-10-CM

## 2020-12-18 DIAGNOSIS — E785 Hyperlipidemia, unspecified: Secondary | ICD-10-CM

## 2020-12-18 DIAGNOSIS — E1142 Type 2 diabetes mellitus with diabetic polyneuropathy: Secondary | ICD-10-CM

## 2020-12-18 DIAGNOSIS — I6990 Unspecified sequelae of unspecified cerebrovascular disease: Secondary | ICD-10-CM

## 2020-12-18 DIAGNOSIS — K5901 Slow transit constipation: Secondary | ICD-10-CM

## 2020-12-18 DIAGNOSIS — E1159 Type 2 diabetes mellitus with other circulatory complications: Secondary | ICD-10-CM

## 2020-12-18 MED ORDER — CLOPIDOGREL BISULFATE 75 MG PO TABS
75.0000 mg | ORAL_TABLET | Freq: Every day | ORAL | 1 refills | Status: DC
Start: 2020-12-18 — End: 2021-07-06

## 2020-12-18 MED ORDER — INDAPAMIDE 1.25 MG PO TABS: ORAL_TABLET | ORAL | 1 refills | Status: AC

## 2020-12-18 MED ORDER — ATORVASTATIN CALCIUM 40 MG PO TABS: 40.0000 mg | ORAL_TABLET | Freq: Every day | ORAL | 1 refills | Status: AC

## 2020-12-18 MED ORDER — AMLODIPINE BESYLATE 10 MG PO TABS: 10.0000 mg | ORAL_TABLET | Freq: Every day | ORAL | 1 refills | Status: AC

## 2020-12-18 NOTE — Telephone Encounter (Signed)
Returned call to Marisue Ivan and informed her that Richarda Blade, NP approved orders. She verbalized her understanding and agreed.

## 2020-12-18 NOTE — Telephone Encounter (Signed)
Patient daughter called requesting refill. Requesting 90 day supply.

## 2020-12-21 LAB — CUP PACEART REMOTE DEVICE CHECK
Date Time Interrogation Session: 20220821230148
Implantable Pulse Generator Implant Date: 20201106

## 2020-12-29 ENCOUNTER — Telehealth: Payer: Self-pay

## 2020-12-29 NOTE — Telephone Encounter (Signed)
Almira Coaster called back requesting order mobile xray of right shoulder secondary to possible subluxation. Company for mobile xray order call into [phone].  To Areatha Keas, NP

## 2020-12-29 NOTE — Telephone Encounter (Signed)
Called patient. No answer, LMOM. Discussed with Almira Coaster who will also relay the msg to schedule office visit.

## 2020-12-29 NOTE — Telephone Encounter (Signed)
Marisue Ivan with [facility] nurse called stating on the 25th kim LPN left msg about the patient's daughter requesting a culture and UA bc patient not acting right and wound on foot had a small hole and draining (Green and thick drainage from small hole) as if its infected. Marisue Ivan can be reached at [phone].  Marisue Ivan also stated therapist will be calling about getting a mobile xray out for his right arm.  To Areatha Keas (covering Dinah)

## 2020-12-29 NOTE — Telephone Encounter (Signed)
Almira Coaster, speech therapist with [facility] would like verbal orders 1 week 4 to address broken language, production and understanding secondary to CVA [phone]. To Areatha Keas

## 2020-12-29 NOTE — Telephone Encounter (Signed)
Lets have him make appt in office

## 2020-12-29 NOTE — Telephone Encounter (Signed)
It sounds like this area needs to be evaluated in office, please have patient make appt.

## 2020-12-31 NOTE — Telephone Encounter (Signed)
Patient's daughter Cala Bradford) returned call and was advised. Cala Bradford scheduled a MyChart video visit with Dinah on 01/07/2021.

## 2021-01-06 NOTE — Progress Notes (Signed)
Carelink Summary Report / Loop Recorder 

## 2021-01-07 ENCOUNTER — Telehealth (INDEPENDENT_AMBULATORY_CARE_PROVIDER_SITE_OTHER): Payer: Medicare Other | Admitting: Family

## 2021-01-07 ENCOUNTER — Other Ambulatory Visit: Payer: Self-pay

## 2021-01-07 ENCOUNTER — Other Ambulatory Visit: Payer: Self-pay | Admitting: *Deleted

## 2021-01-07 ENCOUNTER — Encounter: Payer: Self-pay | Admitting: Family

## 2021-01-07 DIAGNOSIS — R451 Restlessness and agitation: Secondary | ICD-10-CM

## 2021-01-07 DIAGNOSIS — M25511 Pain in right shoulder: Secondary | ICD-10-CM

## 2021-01-07 DIAGNOSIS — E1142 Type 2 diabetes mellitus with diabetic polyneuropathy: Secondary | ICD-10-CM

## 2021-01-07 DIAGNOSIS — L89610 Pressure ulcer of right heel, unstageable: Secondary | ICD-10-CM

## 2021-01-07 DIAGNOSIS — I6990 Unspecified sequelae of unspecified cerebrovascular disease: Secondary | ICD-10-CM

## 2021-01-07 MED ORDER — ACCU-CHEK AVIVA PLUS W/DEVICE KIT: 1.0000 | PACK | Freq: Four times a day (QID) | 0 refills | Status: DC

## 2021-01-07 MED ORDER — LINAGLIPTIN 5 MG PO TABS
5.0000 mg | ORAL_TABLET | Freq: Every day | ORAL | Status: AC
Start: 2021-01-07 — End: ?

## 2021-01-07 MED ORDER — QUETIAPINE FUMARATE 25 MG PO TABS: 50.0000 mg | ORAL_TABLET | Freq: Every day | ORAL | 1 refills | Status: DC

## 2021-01-07 MED ORDER — QUETIAPINE FUMARATE 25 MG PO TABS: 50.0000 mg | ORAL_TABLET | Freq: Every day | ORAL | 1 refills | Status: AC

## 2021-01-07 NOTE — Progress Notes (Signed)
This service is provided via telemedicine  No vital signs collected/recorded due to the encounter was a telemedicine visit.   Location of patient (ex: home, work):  Home.  Patient consents to a telephone visit:  Yes  Location of the provider (ex: office, home):  [employer].   Name of any referring provider:  Sayed Apostol, Douglas Citrin, NP   Names of all persons participating in the telemedicine service and their role in the encounter:  Patient, Daughter "Douglas [name], Douglas [name], RMA, Douglas [name], [place], NP.    Time spent on call:  8 minutes spent on the phone with Medical Assistant.      Provider: Richarda Blade FNP-C  Jarquez Mestre, Douglas Citrin, NP  Patient Care Team: Jamina Macbeth, Douglas Citrin, NP as PCP - General (Family Medicine)  Extended Emergency Contact Information Primary Emergency Contact: [facility] Phone: [phone] Mobile Phone: [phone] Relation: Daughter Secondary Emergency Contact: Ham,Joyce Mobile Phone: [phone] Relation: Friend Preferred language: English Interpreter needed? No  Code Status:  Full Code  Goals of care: Advanced Directive information Advanced Directives 01/07/2021  Does Patient Have a Medical Advance Directive? Yes  Type of Advance Directive Healthcare Power of Attorney  Does patient want to make changes to medical advance directive? No - Patient declined  Copy of Healthcare Power of Attorney in Chart? No - copy requested  Would patient like information on creating a medical advance directive? -     Chief Complaint  Patient presents with   Acute Visit    Patient complains of should pain, and requesting x-ray.    HPI:  Pt is a 70 y.o. male seen today for an acute visit for evaluation of right shoulder pain.he is seen on video with daughter's assistance.daughter states had dislocation of shoulder but has heeling well.Has worked with OT for subluxation of the right shoulder. Daughter states feels pain is getting  better and seems to be getting better. Per daughter the separation on shoulder has narrowed thinking it's healing well.   She also states patient has been agitated towards new care givers who assist when daughter goes to work.request seroQuel dose to be increased to help with agitation.  Has also worked with speech therapy but recently completed visit.states patient was doing well ST was able to work with him had started reading and able to pronounce some words.would like to extend speech therapy due to aphasia from stroke.Has some occasional choking with meals.  Has a right heel ulcer.has been elevating heel on pillows.states no drainage or odor or redness on the heel.she is worried about some black areas on the ulcer.Home health has been changing the dressing.    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   Renal insufficiency 04/04/2018   Seizures (HCC)    Stroke (HCC)    UTI (urinary tract infection) 03/05/2019   Past Surgical History:  Procedure Laterality Date   IR PATIENT  EVAL TECH 0-60 MINS  03/30/2018   LOOP RECORDER INSERTION N/A 03/08/2019   Procedure: LOOP RECORDER INSERTION;  Surgeon: Regan Lemmingamnitz, Will Martin, MD;  Location: [facility]AS[facility] CV [facility];  Service: Cardiovascular;  Laterality: N/A;   TEE WITHOUT CARDIOVERSION N/A 04/02/2018   Procedure: TRANSESOPHAGEAL ECHOCARDIOGRAM (TEE);  Surgeon: Chilton Siandolph, Tiffany, MD;  [facility];  Service: Cardiovascular;  Laterality: N/A;    Allergies   Allergen Reactions   Ciprofloxacin Anaphylaxis   Keppra [Levetiracetam] Other (See Comments)    Makes the patient very agitated and "not like himself"   Avocado Nausea And Vomiting   Metformin And Related Other (See Comments)    "Renal issue"    Outpatient Encounter Medications as of 01/07/2021  Medication Sig   amLODipine (NORVASC) 10 MG tablet Take 1 tablet (10 mg total) by mouth daily.   ascorbic acid (VITAMIN C) 500 MG tablet Take 1 tablet (500 mg total) by mouth daily as needed (if cold-like symptoms are present).   aspirin EC 325 MG tablet Take 325 mg by mouth every morning.   atorvastatin (LIPITOR) 40 MG tablet Take 1 tablet (40 mg total) by mouth daily at 6 (six) AM.   baclofen (LIORESAL) 20 MG tablet Take 1 tablet (20 mg total) by mouth 2 (two) times daily.   BD PEN NEEDLE NANO U/F 32G X 4 MM MISC TAKE AS DIRECTED   carvedilol (COREG) 12.5 MG tablet Take 1 tablet (12.5 mg total) by mouth 2 (two) times daily with a meal. WILL NEED APPT FOR FUTURE REFILLS   ciclopirox (PENLAC) 8 % solution Apply topically at bedtime. Apply over nail and surrounding skin. Apply daily over previous coat. After seven (7) days, may remove with alcohol and continue cycle.   clopidogrel (PLAVIX) 75 MG tablet Take 1 tablet (75 mg total) by mouth daily.   diphenhydrAMINE HCl, Sleep, (NIGHT TIME SLEEP AID PO) Take 1 tablet by mouth daily. Has melatonin in it   glipiZIDE (GLUCOTROL) 10 MG tablet Take one tablet by mouth twice daily with meal.   indapamide (LOZOL) 1.25 MG tablet Take one tablet by mouth once daily.   insulin detemir (LEVEMIR) 100 UNIT/ML FlexPen Inject 5 Units into the skin daily.   ketoconazole (NIZORAL) 2 % cream Apply 1 application topically daily.   lamoTRIgine (LAMICTAL) 150 MG tablet Take 1 tablet (150 mg total) by mouth 2 (two) times daily.   linagliptin (TRADJENTA) 5 MG TABS tablet Take 1 tablet (5 mg total) by mouth daily.   liver oil-zinc oxide (DESITIN) 40 % ointment Apply 1  application topically 2 (two) times daily as needed for irritation.   midodrine (PROAMATINE) 5 MG tablet TAKE 1 TAB IN AM, NOON, & BEDTIME FOR ORTHOSTATIC HYPOTENSION NEEDS APPOINTMENT BEFORE MORE REFILLS   NOVOLOG FLEXPEN 100 UNIT/ML FlexPen INJECT 2-15 UNITS INTO THE SKIN 4 TIMES DAILY - BEFORE MEALS AND AT BEDTIME. SSI: 121-150 = 2 UNITS 151-200 = 3 UNITS, 201-250 = 5 UNITS, 251-300 = 8 UNITS, 301-350 = 11 UNITS, 351-300 = 15 UNITS, GREATER THAN 400 = 15 UNITS AND CALL MD   QUEtiapine (SEROQUEL) 25 MG tablet TAKE 1 TABLET BY MOUTH EVERYDAY AT BEDTIME   senna-docusate (SENOKOT-S) 8.6-50 MG tablet Take 1 tablet by mouth at bedtime as needed for mild constipation.   traZODone (DESYREL) 50 MG tablet Take 1 tablet (50 mg total) by mouth at bedtime.   Vitamin D3 (VITAMIN D) 25 MCG tablet Take 0.5 tablets (500 Units total) by mouth daily.   [DISCONTINUED] risperiDONE (  RISPERDAL) 2 MG tablet Take 2 mg by mouth at bedtime.   No facility-administered encounter medications on file as of 01/07/2021.    Review of Systems  Constitutional:  Negative for appetite change, chills, fatigue, fever and unexpected weight change.  HENT:  Positive for trouble swallowing. Negative for congestion, dental problem, ear discharge, ear pain, facial swelling, hearing loss, nosebleeds, postnasal drip, rhinorrhea, sinus pressure, sinus pain, sneezing, sore throat and tinnitus.   Eyes:  Negative for pain, discharge, redness, itching and visual disturbance.  Respiratory:  Negative for cough, chest tightness, shortness of breath and wheezing.   Cardiovascular:  Negative for chest pain, palpitations and leg swelling.  Gastrointestinal:  Negative for abdominal distention, abdominal pain, blood in stool, constipation, diarrhea, nausea and vomiting.  Genitourinary:  Negative for difficulty urinating, dysuria, flank pain, frequency and urgency.  Musculoskeletal:  Positive for arthralgias and gait problem. Negative for back pain, joint  swelling, myalgias, neck pain and neck stiffness.       Right shoulder subluxation   Skin:  Positive for wound. Negative for color change, pallor and rash.       Right heel ulcer   Neurological:  Positive for weakness. Negative for dizziness, syncope, light-headedness, numbness and headaches.       Aphasia  Left side weakness   Hematological:  Does not bruise/bleed easily.  Psychiatric/Behavioral:  Positive for agitation. Negative for behavioral problems, confusion, hallucinations, self-injury, sleep disturbance and suicidal ideas. The patient is not nervous/anxious.    Immunization History  Administered Date(s) Administered   Influenza-Unspecified 01/01/2019   PFIZER(Purple Top)SARS-COV-2 Vaccination 04/22/2020   Pneumococcal-Unspecified 12/31/2017   Unspecified SARS-COV-2 Vaccination 12/02/2019   Pertinent  Health Maintenance Due  Topic Date Due   FOOT EXAM  Never done   OPHTHALMOLOGY EXAM  Never done   URINE MICROALBUMIN  Never done   COLONOSCOPY (Pts 45-38yrs Insurance coverage will need to be confirmed)  Never done   PNA vac Low Risk Adult (2 of 2 - PCV13) 01/01/2019   INFLUENZA VACCINE  11/30/2020   HEMOGLOBIN A1C  03/09/2021   Fall Risk  01/07/2021 11/05/2020 10/08/2020 03/06/2020  Falls in the past year? 0 0 0 0  Number falls in past yr: 0 0 0 0  Injury with Fall? 0 0 0 0  Risk for fall due to : No Fall Risks - - -  Follow up Falls evaluation completed - - -   Functional Status Survey:    There were no vitals filed for this visit. There is no height or weight on file to calculate BMI. Physical Exam Vitals reviewed.  Constitutional:      General: He is not in acute distress.    Appearance: Normal appearance. He is not ill-appearing or diaphoretic.  HENT:     Head: Normocephalic.  Pulmonary:     Effort: Pulmonary effort is normal. No respiratory distress.  Musculoskeletal:     Right lower leg: No edema.     Left lower leg: No edema.  Skin:    Comments: Right heel  unstageable ulcer 90% pink in color and the rest dark black in color.  Neurological:     Mental Status: He is alert. Mental status is at baseline.     Gait: Gait abnormal.  Psychiatric:        Behavior: Behavior is agitated.        Judgment: Judgment normal.    Labs reviewed: Recent Labs    04/24/20 0236 04/26/20 0228 05/21/20 1705 08/09/20 1703 09/06/20  1434 10/09/20 1300 11/10/20 0000 11/22/20 2044 11/23/20 1628  NA 140 139   < > 144   < > 141 136* 136 138  K 3.5 3.4*   < > 2.9*   < > 3.7 4.4 3.3* 3.3*  CL 105 101   < > 114*   < > 101 96* 98 100  CO2 25 27   < > 26   < > 31 23* 28 28  GLUCOSE 188* 232*   < > 89   < > 130*  --  127* 135*  BUN 27* 17   < > 15   < > 29* 19 23 24*  CREATININE 1.67* 1.10   < > 0.88   < > 1.55* 0.9 1.14 1.20  CALCIUM 9.5 9.1   < > 7.4*   < > 9.7 9.7 9.6 9.7  MG 2.1 1.7  --  1.6*  --   --   --   --   --    < > = values in this interval not displayed.   Recent Labs    10/09/20 1300 11/10/20 0000 11/22/20 2044 11/23/20 1628  AST 19 21 24 20   ALT 33 26 29 27   ALKPHOS 77 103 81 79  BILITOT 0.8  --  1.0 0.9  PROT 6.7  --  6.9 6.9  ALBUMIN 3.8  --  3.8 3.8   Recent Labs    10/09/20 1300 11/10/20 0000 11/22/20 2044 11/23/20 1628  WBC 5.9 4.8 5.1 4.5  NEUTROABS 4.0 2.90  --  2.7  HGB 12.3* 13.1* 13.6 13.4  HCT 37.5* 38* 41.4 40.7  MCV 93.1  --  93.7 93.8  PLT 201 224 217 212   Lab Results  Component Value Date   TSH 1.36 11/10/2020   Lab Results  Component Value Date   HGBA1C 6.8 (H) 09/06/2020   Lab Results  Component Value Date   CHOL 119 11/10/2020   HDL 34 (L) 04/22/2020   LDLCALC 59 04/22/2020   TRIG 40 04/22/2020   CHOLHDL 3.0 04/22/2020    Significant Diagnostic Results in last 30 days:  CUP PACEART REMOTE DEVICE CHECK  Result Date: 12/21/2020 ILR summary report received. Battery status OK. Normal device function. No new symptom, tachy, brady, or pause episodes. No new AF episodes. Monthly summary reports and  ROV/PRN   Assessment/Plan  1. Agitation Worsening towards care givers. Increased Seroquel from 25 mg tablet daily to 50 mg tablet daily.Advised to administered 1/2 tablet twice daily if drowsiness noted.   2. Acute pain of right shoulder Status post subluxation of shoulder with progressive healing. - For home use only DME Other see comment:[facility] mobile imaging to obtain right shoulder 2-3 views X-ray rule out fracture.portable x-ray due to non-ambulatory bed bound telephone [phone]. Order to be faxed   3. 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  - Ambulatory referral to Home Health: speech therapy for speech and swallowing evaluation.Request [facility] speech therapist.   4. Type 2 diabetes mellitus with peripheral neuropathy (HCC) Lab Results  Component Value Date   HGBA1C 6.8 (H) 09/06/2020  No CBG fro evaluation  Request Accucheck glucometer previous glucometer is broken.  Continue levemir,glipizide and Novolog. - continue ASA and Statin  - linagliptin (TRADJENTA) 5 MG TABS tablet; Take 1 tablet (5 mg total) by mouth daily.  Dispense: 30 tablet  5. Pressure ulcer of heel, right, unstageable (HCC) - For home use only DME Other see comment: [facility]  Home health Nurse to cleanse right heel ulcer with saline,pat dry apply small amount of santyl to dark black areas and cover with foam dressing for absorption and protection.change dressing every 3 days and as needed if soiled until resolved.Notify provider for any odor or signs of infection.Keep right heel elevated off bed at all times. HH orders to be faxed by [facility]    Family/ staff Communication: Reviewed plan of care with patient's daughter verbalized understanding.  Labs/tests ordered: None   Next Appointment: As needed if symptoms worsen or fail to improve   I connected with  Douglas [name]. on 01/07/21 by a video enabled telemedicine application and verified that I am speaking  with the correct person using two identifiers.   I discussed the limitations of evaluation and management by telemedicine. The patient expressed understanding and agreed to proceed.  Spent 22 minutes of video face to face with patient with assistance from Daughter.     Douglas Bookman, NP

## 2021-01-07 NOTE — Patient Instructions (Signed)
[facility] Home health Nurse to cleanse right heel ulcer with saline,pat dry apply small amount of santyl to dark black areas and cover with foam dressing for absorption and protection.change dressing every 3 days and as needed if soiled until resolved.Notify provider for any odor or signs of infection.Keep right heel elevated off bed at all times.

## 2021-01-07 NOTE — Telephone Encounter (Signed)
Pharmacy sent Request for a 90 day supply. Stated Rx was sent in for 2 tablets daily.  Pended Rx and sent to [facility] for approval.

## 2021-01-08 ENCOUNTER — Other Ambulatory Visit: Payer: Self-pay | Admitting: *Deleted

## 2021-01-08 MED ORDER — ACCU-CHEK GUIDE W/DEVICE KIT: PACK | 0 refills | Status: AC

## 2021-01-08 MED ORDER — ACCU-CHEK GUIDE VI STRP: ORAL_STRIP | 3 refills | Status: AC

## 2021-01-08 NOTE — Telephone Encounter (Signed)
Received fax from pharmacy stating that Accucheck aviva monitor is no longer made and requesting a Rx for a Accucheck Guide.

## 2021-01-13 ENCOUNTER — Telehealth: Payer: Self-pay

## 2021-01-13 NOTE — Telephone Encounter (Signed)
HHN to collect urine specimen for U/A and C/S to rule out UTI

## 2021-01-13 NOTE — Telephone Encounter (Signed)
Verbal Orders Given.  

## 2021-01-13 NOTE — Telephone Encounter (Signed)
Patsy Lager from [facility] states that patient daughter has concerns of patient yelling out at night. Patient daughter states that this has happen before in the past right before patient has had UTI. Nurse Patsy Lager wanted to know if she could receive verbal order to collect urine specimen from patient. Message routed to PCP Ngetich, Donalee Citrin, NP for approval.

## 2021-01-14 ENCOUNTER — Telehealth: Payer: Self-pay | Admitting: *Deleted

## 2021-01-14 DIAGNOSIS — L89610 Pressure ulcer of right heel, unstageable: Secondary | ICD-10-CM

## 2021-01-14 NOTE — Telephone Encounter (Signed)
She has evaluated and treating the Heel Wound she is calling because patient has a New Pressure wound on the Back of Right Leg now.   Are the verbal orders ok for the NEW wound.  Please Advise.

## 2021-01-14 NOTE — Telephone Encounter (Signed)
Okay to give HHN verbal orders.  

## 2021-01-14 NOTE — Telephone Encounter (Signed)
Patient had video visit with provider on 01/07/2021 and right heel wound care orders and HHN was ordered.see note.

## 2021-01-14 NOTE — Telephone Encounter (Signed)
Marisue Ivan with [facility] called and stated that patient has a new Pressure wound on back of Right leg.  Stated that the Caregiver has been propping up patient's leg on blankets to get his heel off of the bed and now has new wound to back of leg measuring 5cm long X 1cm wide with black escar but no drainage.   Requesting verbal orders for foam Dressing 2 times a week.   Please Advise.

## 2021-01-15 MED ORDER — SANTYL 250 UNIT/GM EX OINT: 1.0000 "application " | TOPICAL_OINTMENT | CUTANEOUS | 1 refills | Status: AC

## 2021-01-15 NOTE — Telephone Encounter (Signed)
Marisue Ivan, Nurse notified and agreed.   Also Stated that the pharmacy has not received a Rx for the Santyl that was ordered on 01/07/2021 for the Heel wound. Needs sent to pharmacy. They cannot order Santyl, it has to come from Provider.   Pended and sent to [facility] for approval.    OV Note Dated 01/07/2021: 5. Pressure ulcer of heel, right, unstageable (HCC) - For home use only DME Other see comment: [facility] Home health Nurse to cleanse right heel ulcer with saline,pat dry apply small amount of santyl to dark black areas and cover with foam dressing for absorption and protection.change dressing every 3 days and as needed if soiled until resolved.Notify provider for any odor or signs of infection.Keep right heel elevated off bed at all times. HH orders to be faxed by [facility]

## 2021-01-15 NOTE — Telephone Encounter (Signed)
LMOM to return call.

## 2021-01-15 NOTE — Telephone Encounter (Signed)
Okay.will resend script.

## 2021-01-18 ENCOUNTER — Ambulatory Visit: Payer: Commercial Indemnity

## 2021-01-19 ENCOUNTER — Other Ambulatory Visit: Payer: Self-pay | Admitting: Family

## 2021-01-19 MED ORDER — SACCHAROMYCES BOULARDII 250 MG PO CAPS: 250.0000 mg | ORAL_CAPSULE | Freq: Two times a day (BID) | ORAL | 0 refills | Status: DC

## 2021-01-19 MED ORDER — CIPROFLOXACIN HCL 500 MG PO TABS: 500.0000 mg | ORAL_TABLET | Freq: Two times a day (BID) | ORAL | 0 refills | Status: DC

## 2021-01-19 NOTE — Telephone Encounter (Signed)
Called patient daughter "Barack Nicodemus" to discuss labs. No answer so voicemail was left. Medications pending to be sent into pharmacy. Patient medication have allergy contraindications. Medications and message routed back to PCP Ngetich, Dinah C, NP for approval.

## 2021-01-19 NOTE — Telephone Encounter (Signed)
Final Urine culture results received indicates Mirabilis > 100,000 colonies  Start on Cipro 500 mg tablet one by mouth twice daily x 7 days Take along with probiotics Florastor 250 mg capsule one by mouth twice daily x 10 days to prevent antibiotics associated diarrhea

## 2021-01-20 ENCOUNTER — Telehealth: Payer: Self-pay | Admitting: *Deleted

## 2021-01-20 MED ORDER — AMOXICILLIN-POT CLAVULANATE 875-125 MG PO TABS: 1.0000 | ORAL_TABLET | Freq: Two times a day (BID) | ORAL | 0 refills | Status: DC

## 2021-01-20 NOTE — Telephone Encounter (Signed)
Patient daughter notified and agreed.  Cipro Discontinued and Augmentin sent to pharmacy.

## 2021-01-20 NOTE — Telephone Encounter (Signed)
Patient daughter, [name], called and stated that the Ciprofloxacin called in for patent's UTI Yesterday is in patient's ALLERGY List.   Daughter is concerned with giving it to patient.   Please Advise.

## 2021-01-20 NOTE — Telephone Encounter (Signed)
-   D/c Cipro  Start on Augmentin 875 -125 mg tablet one by mouth twice daily x 7 days

## 2021-01-23 LAB — CUP PACEART REMOTE DEVICE CHECK
Date Time Interrogation Session: 20220923230620
Implantable Pulse Generator Implant Date: 20201106

## 2021-02-10 DIAGNOSIS — L89890 Pressure ulcer of other site, unstageable: Secondary | ICD-10-CM | POA: Diagnosis not present

## 2021-02-10 DIAGNOSIS — L89616 Pressure-induced deep tissue damage of right heel: Secondary | ICD-10-CM | POA: Diagnosis not present

## 2021-02-10 DIAGNOSIS — I69320 Aphasia following cerebral infarction: Secondary | ICD-10-CM | POA: Diagnosis not present

## 2021-02-10 DIAGNOSIS — E1159 Type 2 diabetes mellitus with other circulatory complications: Secondary | ICD-10-CM

## 2021-02-10 DIAGNOSIS — I69318 Other symptoms and signs involving cognitive functions following cerebral infarction: Secondary | ICD-10-CM

## 2021-02-10 DIAGNOSIS — I69351 Hemiplegia and hemiparesis following cerebral infarction affecting right dominant side: Secondary | ICD-10-CM

## 2021-02-10 DIAGNOSIS — R1312 Dysphagia, oropharyngeal phase: Secondary | ICD-10-CM

## 2021-02-10 DIAGNOSIS — I69391 Dysphagia following cerebral infarction: Secondary | ICD-10-CM | POA: Diagnosis not present

## 2021-02-25 ENCOUNTER — Ambulatory Visit (INDEPENDENT_AMBULATORY_CARE_PROVIDER_SITE_OTHER): Payer: Medicare Other

## 2021-02-25 DIAGNOSIS — Z8673 Personal history of transient ischemic attack (TIA), and cerebral infarction without residual deficits: Secondary | ICD-10-CM

## 2021-02-26 LAB — CUP PACEART REMOTE DEVICE CHECK
Date Time Interrogation Session: 20221026230943
Implantable Pulse Generator Implant Date: 20201106

## 2021-03-05 NOTE — Progress Notes (Signed)
Carelink Summary Report / Loop Recorder 

## 2021-03-13 ENCOUNTER — Other Ambulatory Visit: Payer: Self-pay | Admitting: Adult Health

## 2021-03-13 DIAGNOSIS — I6990 Unspecified sequelae of unspecified cerebrovascular disease: Secondary | ICD-10-CM

## 2021-03-13 DIAGNOSIS — E1159 Type 2 diabetes mellitus with other circulatory complications: Secondary | ICD-10-CM

## 2021-03-13 DIAGNOSIS — I152 Hypertension secondary to endocrine disorders: Secondary | ICD-10-CM

## 2021-03-30 ENCOUNTER — Ambulatory Visit (INDEPENDENT_AMBULATORY_CARE_PROVIDER_SITE_OTHER): Payer: Medicare Other

## 2021-03-30 DIAGNOSIS — Z8673 Personal history of transient ischemic attack (TIA), and cerebral infarction without residual deficits: Secondary | ICD-10-CM

## 2021-03-30 LAB — CUP PACEART REMOTE DEVICE CHECK
Date Time Interrogation Session: 20221128231158
Implantable Pulse Generator Implant Date: 20201106

## 2021-04-07 ENCOUNTER — Other Ambulatory Visit: Payer: Self-pay | Admitting: Family

## 2021-04-07 DIAGNOSIS — E1159 Type 2 diabetes mellitus with other circulatory complications: Secondary | ICD-10-CM

## 2021-04-07 DIAGNOSIS — I6990 Unspecified sequelae of unspecified cerebrovascular disease: Secondary | ICD-10-CM

## 2021-04-08 NOTE — Progress Notes (Signed)
Carelink Summary Report / Loop Recorder 

## 2021-04-11 DIAGNOSIS — I69320 Aphasia following cerebral infarction: Secondary | ICD-10-CM | POA: Diagnosis not present

## 2021-04-11 DIAGNOSIS — R451 Restlessness and agitation: Secondary | ICD-10-CM

## 2021-04-11 DIAGNOSIS — I69351 Hemiplegia and hemiparesis following cerebral infarction affecting right dominant side: Secondary | ICD-10-CM | POA: Diagnosis not present

## 2021-04-11 DIAGNOSIS — L89890 Pressure ulcer of other site, unstageable: Secondary | ICD-10-CM | POA: Diagnosis not present

## 2021-04-11 DIAGNOSIS — I69391 Dysphagia following cerebral infarction: Secondary | ICD-10-CM | POA: Diagnosis not present

## 2021-04-11 DIAGNOSIS — I69318 Other symptoms and signs involving cognitive functions following cerebral infarction: Secondary | ICD-10-CM

## 2021-04-11 DIAGNOSIS — E1159 Type 2 diabetes mellitus with other circulatory complications: Secondary | ICD-10-CM

## 2021-04-11 DIAGNOSIS — R1312 Dysphagia, oropharyngeal phase: Secondary | ICD-10-CM

## 2021-05-02 LAB — CUP PACEART REMOTE DEVICE CHECK
Date Time Interrogation Session: 20221231230519
Implantable Pulse Generator Implant Date: 20201106

## 2021-05-04 ENCOUNTER — Ambulatory Visit (INDEPENDENT_AMBULATORY_CARE_PROVIDER_SITE_OTHER): Payer: Medicare Other

## 2021-05-04 DIAGNOSIS — Z8673 Personal history of transient ischemic attack (TIA), and cerebral infarction without residual deficits: Secondary | ICD-10-CM | POA: Diagnosis not present

## 2021-05-06 ENCOUNTER — Telehealth: Payer: Self-pay | Admitting: *Deleted

## 2021-05-06 NOTE — Telephone Encounter (Signed)
May collect urine specimen for U/A and C/S rule out UTI

## 2021-05-06 NOTE — Telephone Encounter (Signed)
Douglas [name] with [facility] and stated that she saw patient today and he was increased Agitated and has a odor to his urine. No other symptoms noted.   Nurse is wanting to know if she could collect a urine for a Urine Culture to rule out UTI.   Please Advise.

## 2021-05-06 NOTE — Telephone Encounter (Signed)
Called and left message on [employer].

## 2021-05-11 ENCOUNTER — Telehealth: Payer: Self-pay | Admitting: Family

## 2021-05-11 NOTE — Telephone Encounter (Signed)
Noted  

## 2021-05-11 NOTE — Telephone Encounter (Signed)
I called and spoke with Douglas [name] 05/11/21 and asked her if she had found in home care for Douglas [name] or if she was still looking. She said she had not been looking into it & she was still wanting to stay with Douglas [name], I told her if Douglas [name] was going to remain the PCP we need to see him in office SOON being that we have not seen him in office since 03/06/20 he has only had video visits. So I let her know we sign a lot of home health orders and we've been doing them but were going to need an in office visit going forward. She said she would call back and schedule something.

## 2021-05-13 NOTE — Progress Notes (Signed)
Carelink Summary Report / Loop Recorder 

## 2021-05-18 ENCOUNTER — Telehealth: Payer: Self-pay

## 2021-05-18 DIAGNOSIS — I6990 Unspecified sequelae of unspecified cerebrovascular disease: Secondary | ICD-10-CM

## 2021-05-18 DIAGNOSIS — E1159 Type 2 diabetes mellitus with other circulatory complications: Secondary | ICD-10-CM

## 2021-05-18 DIAGNOSIS — I152 Hypertension secondary to endocrine disorders: Secondary | ICD-10-CM

## 2021-05-18 NOTE — Telephone Encounter (Signed)
Urine culture not received.please send results.

## 2021-05-18 NOTE — Telephone Encounter (Signed)
Incoming call received from [facility] requesting an antibiotic for UTI treatment.  I asked Patsy Lager to elaborate on her request and she states that patient had a u/a and culture collected on 05/07/21, the results were to be sent to [facility].  I informed Gabriel Cirri that I do not see that we have received results. Gabriel Cirri will call her office and have them fax results to [place].  Once results received Patsy Lager will suspect that Carilyn Goodpasture will send an antibiotic to the pharmacy.

## 2021-05-19 MED ORDER — SACCHAROMYCES BOULARDII 250 MG PO CAPS: 250.0000 mg | ORAL_CAPSULE | Freq: Two times a day (BID) | ORAL | 0 refills | Status: AC

## 2021-05-19 MED ORDER — AMOXICILLIN-POT CLAVULANATE 875-125 MG PO TABS: 1.0000 | ORAL_TABLET | Freq: Two times a day (BID) | ORAL | 0 refills | Status: DC

## 2021-05-19 NOTE — Telephone Encounter (Signed)
RX's sent to pharmacy. I called Yolanda to inform her and was unable to leave a message (mailbox was full). I will try again tomorrow

## 2021-05-19 NOTE — Telephone Encounter (Signed)
Final urine culture indicates urinary tract infection Start on Augmentin 875/125 mg tablet one by mouth twice daily x 7 days  Take along with probiotics Florastor 250 mg capsule one by mouth twice daily x 10 days to prevent antibiotics associated diarrhea

## 2021-05-19 NOTE — Telephone Encounter (Signed)
Report is now in chart under media tab with a date of 05/12/21

## 2021-05-20 NOTE — Telephone Encounter (Signed)
Spoke with Patsy Lager this morning around 10 am and informed her that RX was sent to pharmacy yesterday.   I called Cala Bradford, patients daughter and her mailbox was full, I opted to send a SMS notification with the [facility] office number.  When [name] returns the call we will need to let her know that 2 rx's were sent in for UTI treatment (antibiotic and probiotic).

## 2021-05-21 MED ORDER — CARVEDILOL 12.5 MG PO TABS: ORAL_TABLET | ORAL | 0 refills | Status: DC

## 2021-05-21 NOTE — Telephone Encounter (Signed)
Spoke with daughter [name] and she stated that they have picked up Rx's.   Also requesting Rx for Carvedilol.   Scheduled a MyChart Visit. Stated that they have a hard time getting patient out of the house to come into office.

## 2021-05-21 NOTE — Addendum Note (Signed)
Addended by: Nelda Severe A on: 05/21/2021 10:11 AM   Modules accepted: Orders

## 2021-05-26 ENCOUNTER — Other Ambulatory Visit: Payer: Self-pay

## 2021-05-26 ENCOUNTER — Telehealth: Payer: Medicare Other | Admitting: Family

## 2021-06-02 ENCOUNTER — Other Ambulatory Visit: Payer: Self-pay

## 2021-06-02 ENCOUNTER — Telehealth: Payer: Medicare Other | Admitting: Family

## 2021-06-02 ENCOUNTER — Telehealth: Payer: Self-pay

## 2021-06-02 NOTE — Telephone Encounter (Signed)
Noted  

## 2021-06-02 NOTE — Telephone Encounter (Signed)
I made three attempts to call patient at times 4:03pm, 4:15pm, and 4:33pm. Patient will need to reschedule MyChart visit. Message routed to PCP Ngetich, Donalee Citrin, NP , and front administration Tera Partridge, and [employer].

## 2021-06-04 NOTE — Telephone Encounter (Signed)
A face to face visit required to order DME

## 2021-06-07 ENCOUNTER — Ambulatory Visit (INDEPENDENT_AMBULATORY_CARE_PROVIDER_SITE_OTHER): Payer: Medicare Other

## 2021-06-07 DIAGNOSIS — Z8673 Personal history of transient ischemic attack (TIA), and cerebral infarction without residual deficits: Secondary | ICD-10-CM | POA: Diagnosis not present

## 2021-06-07 LAB — CUP PACEART REMOTE DEVICE CHECK
Date Time Interrogation Session: 20230205231007
Implantable Pulse Generator Implant Date: 20201106

## 2021-06-08 ENCOUNTER — Other Ambulatory Visit: Payer: Self-pay

## 2021-06-08 ENCOUNTER — Telehealth: Payer: Medicare Other | Admitting: Family

## 2021-06-09 NOTE — Progress Notes (Signed)
Carelink Summary Report / Loop Recorder 

## 2021-06-11 ENCOUNTER — Ambulatory Visit (INDEPENDENT_AMBULATORY_CARE_PROVIDER_SITE_OTHER): Payer: Medicare Other | Admitting: Adult Health

## 2021-06-11 ENCOUNTER — Other Ambulatory Visit: Payer: Self-pay

## 2021-06-11 ENCOUNTER — Encounter: Payer: Self-pay | Admitting: Adult Health

## 2021-06-11 VITALS — BP 116/70 | HR 65 | Temp 97.5°F

## 2021-06-11 DIAGNOSIS — Z8673 Personal history of transient ischemic attack (TIA), and cerebral infarction without residual deficits: Secondary | ICD-10-CM | POA: Diagnosis not present

## 2021-06-11 DIAGNOSIS — S81802A Unspecified open wound, left lower leg, initial encounter: Secondary | ICD-10-CM | POA: Diagnosis not present

## 2021-06-11 DIAGNOSIS — I6990 Unspecified sequelae of unspecified cerebrovascular disease: Secondary | ICD-10-CM

## 2021-06-11 DIAGNOSIS — E1142 Type 2 diabetes mellitus with diabetic polyneuropathy: Secondary | ICD-10-CM

## 2021-06-11 MED ORDER — XEROFORM OCCLUSIVE GAUZE PATCH EX PADS: 1.0000 | MEDICATED_PAD | CUTANEOUS | 1 refills | Status: AC

## 2021-06-11 NOTE — Patient Instructions (Signed)
Wound Care, Adult ?Taking care of your wound properly can help to prevent pain, infection, and scarring. It can also help your wound heal more quickly. Follow instructions from your health care provider about how to care for your wound. ?Supplies needed: ?Soap and water. ?Wound cleanser, saline, or germ-free (sterile) water. ?Gauze. ?If needed, a clean bandage (dressing) or other type of wound dressing material to cover or place in the wound. Follow your health care provider's instructions about what dressing supplies to use. ?Cream or topical ointment to apply to the wound, if told by your health care provider. ?How to care for your wound ?Cleaning the wound ?Ask your health care provider how to clean the wound. This may include: ?Using mild soap and water, a wound cleanser, saline, or sterile water. ?Using a clean gauze to pat the wound dry after cleaning it. Do not rub or scrub the wound. ?Dressing care ?Wash your hands with soap and water for at least 20 seconds before and after you change the dressing. If soap and water are not available, use hand sanitizer. ?Change your dressing as told by your health care provider. This may include: ?Cleaning or rinsing out (irrigating) the wound. ?Application of cream or topical ointment, if told by your health care provider. ?Placing a dressing over the wound or in the wound (packing). ?Covering the wound with an outer dressing. ?Leave stitches (sutures), staples, skin glue, or adhesive strips in place. These skin closures may need to stay in place for 2 weeks or longer. If adhesive strip edges start to loosen and curl up, you may trim the loose edges. Do not remove adhesive strips completely unless your health care provider tells you to do that. ?Ask your health care provider when you can leave the wound uncovered. ?Checking for infection ?Check your wound area every day for signs of infection. Check for: ?More redness, swelling, or pain. ?Fluid or blood. ?Warmth. ?Pus or  a bad smell. ? ?Follow these instructions at home ?Medicines ?If you were prescribed an antibiotic medicine, cream, or ointment, take or apply it as told by your health care provider. Do not stop using the antibiotic even if your condition improves. ?If you were prescribed pain medicine, take it 30 minutes before you do any wound care or as told by your health care provider. ?Take over-the-counter and prescription medicines only as told by your health care provider. ?Eating and drinking ?Eat a diet that includes protein, vitamin A, vitamin C, and other nutrient-rich foods to help the wound heal. ?Foods rich in protein include meat, fish, eggs, dairy, beans, and nuts. ?Foods rich in vitamin A include carrots and dark green, leafy vegetables. ?Foods rich in vitamin C include citrus fruits, tomatoes, broccoli, and peppers. ?Drink enough fluid to keep your urine pale yellow. ?General instructions ?Do not take baths, swim, or use a hot tub until your health care provider approves. Ask your health care provider if you may take showers. You may only be allowed to take sponge baths. ?Do not scratch or pick at the wound. Keep it covered as told by your health care provider. ?Return to your normal activities as told by your health care provider. Ask your health care provider what activities are safe for you. ?Protect your wound from the sun when you are outside for the first 6 months, or for as long as told by your health care provider. Cover up the scar area or apply sunscreen that has an SPF of at least 30. ?Do not   use any products that contain nicotine or tobacco. These products include cigarettes, chewing tobacco, and vaping devices, such as e-cigarettes. If you need help quitting, ask your health care provider. ?Keep all follow-up visits. This is important. ?Contact a health care provider if: ?You received a tetanus shot and you have swelling, severe pain, redness, or bleeding at the injection site. ?Your pain is not  controlled with medicine. ?You have any of these signs of infection: ?More redness, swelling, or pain around the wound. ?Fluid or blood coming from the wound. ?Warmth coming from the wound. ?A fever or chills. ?You are nauseous or you vomit. ?You are dizzy. ?You have a new rash or hardness around the wound. ?Get help right away if: ?You have a red streak of skin near the area around your wound. ?Pus or a bad smell coming from the wound. ?Your wound has been closed with staples, sutures, skin glue, or adhesive strips and it begins to open up and separate. ?Your wound is bleeding, and the bleeding does not stop with gentle pressure. ?These symptoms may represent a serious problem that is an emergency. Do not wait to see if the symptoms will go away. Get medical help right away. Call your local emergency services (911 in the [place] Do not drive yourself to the hospital. ?Summary ?Always wash your hands with soap and water for at least 20 seconds before and after changing your dressing. ?Change your dressing as told by your health care provider. ?To help with healing, eat foods that are rich in protein, vitamin A, vitamin C, and other nutrients. ?Check your wound every day for signs of infection. Contact your health care provider if you think that your wound is infected. ?This information is not intended to replace advice given to you by your health care provider. Make sure you discuss any questions you have with your health care provider. ?Document Revised: 08/25/2020 Document Reviewed: 08/25/2020 ?Elsevier Patient Education ? 2022 Elsevier Inc. ? ?

## 2021-06-11 NOTE — Progress Notes (Signed)
[facility] clinic  Provider:   Durenda Age  DNP  Code Status:    Full Code  Goals of Care:  Advanced Directives 01/07/2021  Does Patient Have a Medical Advance Directive? Yes  Type of Advance Directive [place]  Does patient want to make changes to medical advance directive? No - Patient declined  Copy of Jacon[place] in Chart? No - copy requested  Would patient like information on creating a medical advance directive? -     Chief Complaint  Patient presents with   Acute Visit    Patient presents today for recommendations and orders for hospital bed due to health reasons.  Right leg wound check and bleeding today.    HPI: Patient is a 71 y.o. male seen today for an acute visit for right lower leg wound. He was brought in by his 2 daughters. He had a stroke in 2013 and 2020. 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 He is coming today for a wound check and wanting a hospital bed. Daughter has a job in [employer] and is moving to [place], [place] tomorrow and will be taking her father with her. Another daughter would be helping during the move. Right lower leg has an open wound which is the size of 1.5 quarter. They were late by 1 hour for the appointment because the wound bled while he was getting dressed. The wound was bloody but not actively bleeding.  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   Renal insufficiency 04/04/2018   Seizures ([place])     Stroke ([place])    UTI (urinary tract infection) 03/05/2019    Past Surgical History:  Procedure Laterality Date   IR PATIENT EVAL TECH 0-60 MINS  03/30/2018   LOOP RECORDER INSERTION N/A 03/08/2019   Procedure: LOOP RECORDER INSERTION;  Surgeon: Constance Haw, MD;  Location: [place];  Service: Cardiovascular;  Laterality: N/A;   TEE WITHOUT CARDIOVERSION N/A 04/02/2018   Procedure: TRANSESOPHAGEAL ECHOCARDIOGRAM (TEE);  Surgeon: Skeet Latch, MD;  Location: [facility] ENDOSCOPY;  Service: Cardiovascular;  Laterality: N/A;    Allergies  Allergen Reactions   Ciprofloxacin Anaphylaxis   Keppra [Levetiracetam] Other (See Comments)    Makes the patient very agitated and "not like himself"   Avocado Nausea And Vomiting   Metformin And Related Other (See Comments)    "Renal issue"    Outpatient Encounter Medications as of 06/11/2021  Medication Sig   amLODipine (NORVASC) 10 MG tablet Take 1 tablet (10 mg total) by mouth daily.   amoxicillin-clavulanate (AUGMENTIN) 875-125 MG tablet Take 1 tablet by mouth 2 (two) times daily. For 7 days   ascorbic acid (VITAMIN C) 500 MG tablet Take 1 tablet (500 mg total) by mouth daily as needed (if cold-like symptoms are present).   aspirin EC 325 MG tablet Take 325 mg by mouth  every morning.   atorvastatin (LIPITOR) 40 MG tablet Take 1 tablet (40 mg total) by mouth daily at 6 (six) AM.   baclofen (LIORESAL) 20 MG tablet Take 1 tablet (20 mg total) by mouth 2 (two) times daily.   BD PEN NEEDLE NANO U/F 32G X 4 MM MISC TAKE AS DIRECTED   Blood Glucose Monitoring Suppl (ACCU-CHEK GUIDE) w/Device KIT Use to test blood sugar four times daily. Dx: E11.42   carvedilol (COREG) 12.5 MG tablet TAKE 1 TABLET (12.5 MG) BY MOUTH 2 (TWO) TIMES DAILY WITH A MEAL.   ciclopirox (PENLAC) 8 % solution Apply topically at bedtime. Apply over nail and surrounding skin. Apply daily over previous coat. After seven (7) days, may remove with alcohol and continue  cycle.   clopidogrel (PLAVIX) 75 MG tablet Take 1 tablet (75 mg total) by mouth daily.   collagenase (SANTYL) ointment Apply 1 application topically every 3 (three) days. To Right Heel.   diphenhydrAMINE HCl, Sleep, (NIGHT TIME SLEEP AID PO) Take 1 tablet by mouth daily. Has melatonin in it   glipiZIDE (GLUCOTROL) 10 MG tablet Take one tablet by mouth twice daily with meal.   glucose blood (ACCU-CHEK GUIDE) test strip Use to test blood sugar four times daily. Dx: E11.42   indapamide (LOZOL) 1.25 MG tablet Take one tablet by mouth once daily.   insulin detemir (LEVEMIR) 100 UNIT/ML FlexPen Inject 5 Units into the skin daily.   ketoconazole (NIZORAL) 2 % cream Apply 1 application topically daily.   lamoTRIgine (LAMICTAL) 150 MG tablet Take 1 tablet (150 mg total) by mouth 2 (two) times daily.   linagliptin (TRADJENTA) 5 MG TABS tablet Take 1 tablet (5 mg total) by mouth daily.   liver oil-zinc oxide (DESITIN) 40 % ointment Apply 1 application topically 2 (two) times daily as needed for irritation.   midodrine (PROAMATINE) 5 MG tablet TAKE 1 TAB IN AM, NOON, & BEDTIME FOR ORTHOSTATIC HYPOTENSION NEEDS APPOINTMENT BEFORE MORE REFILLS   NOVOLOG FLEXPEN 100 UNIT/ML FlexPen INJECT 2-15 UNITS INTO THE SKIN 4 TIMES DAILY - BEFORE MEALS AND AT BEDTIME. SSI: 121-150 = 2 UNITS 151-200 = 3 UNITS, 201-250 = 5 UNITS, 251-300 = 8 UNITS, 301-350 = 11 UNITS, 351-300 = 15 UNITS, GREATER THAN 400 = 15 UNITS AND CALL MD   QUEtiapine (SEROQUEL) 25 MG tablet Take 2 tablets (50 mg total) by mouth at bedtime.   saccharomyces boulardii (FLORASTOR) 250 MG capsule Take 1 capsule (250 mg total) by mouth 2 (two) times daily.   senna-docusate (SENOKOT-S) 8.6-50 MG tablet Take 1 tablet by mouth at bedtime as needed for mild constipation.   traZODone (DESYREL) 50 MG tablet Take 1 tablet (50 mg total) by mouth at bedtime.   Vitamin D3 (VITAMIN D) 25 MCG tablet Take 0.5 tablets (500 Units total) by mouth daily.   No  facility-administered encounter medications on file as of 06/11/2021.    Review of Systems:  Review of Systems  Unable to obtain due to aphasia.  Health Maintenance  Topic Date Due   FOOT EXAM  Never done   OPHTHALMOLOGY EXAM  Never done   URINE MICROALBUMIN  Never done   Hepatitis C Screening  Never done   TETANUS/TDAP  Never done   COLONOSCOPY (Pts 45-20yr Insurance coverage will need to be confirmed)  Never done   Zoster Vaccines- Shingrix (1 of 2) Never done   Pneumonia Vaccine 71 Years old (1 - PCV) 06/01/2015   COVID-19 Vaccine (3 - Booster) 06/17/2020  INFLUENZA VACCINE  11/30/2020   HEMOGLOBIN A1C  03/09/2021   HPV VACCINES  Aged Out    Physical Exam: Vitals:   06/11/21 1106  Temp: (!) 97.5 F (36.4 C)   There is no height or weight on file to calculate BMI. Physical Exam Constitutional:      General: He is not in acute distress. HENT:     Head: Normocephalic and atraumatic.     Mouth/Throat:     Mouth: Mucous membranes are moist.  Eyes:     Conjunctiva/sclera: Conjunctivae normal.  Cardiovascular:     Rate and Rhythm: Normal rate and regular rhythm.     Pulses: Normal pulses.     Heart sounds: Normal heart sounds.  Pulmonary:     Effort: Pulmonary effort is normal.     Breath sounds: Normal breath sounds.  Abdominal:     General: Bowel sounds are normal.     Palpations: Abdomen is soft.  Musculoskeletal:        General: Swelling present.     Right lower leg: Edema present.     Comments: RLE 1+edema  Skin:    General: Skin is warm and dry.  Neurological:     General: No focal deficit present.     Comments: Right hemiplegia. Aphasic.  Psychiatric:        Mood and Affect: Mood normal.        Behavior: Behavior normal.    Labs reviewed: Basic Metabolic Panel: Recent Labs    08/09/20 1703 09/06/20 1434 10/09/20 1300 11/10/20 0000 11/22/20 2044 11/23/20 1628  NA 144   < > 141 136* 136 138  K 2.9*   < > 3.7 4.4 3.3* 3.3*  CL 114*   < > 101  96* 98 100  CO2 26   < > 31 23* 28 28  GLUCOSE 89   < > 130*  --  127* 135*  BUN 15   < > 29* 19 23 24*  CREATININE 0.88   < > 1.55* 0.9 1.14 1.20  CALCIUM 7.4*   < > 9.7 9.7 9.6 9.7  MG 1.6*  --   --   --   --   --   TSH  --   --   --  1.36  --   --    < > = values in this interval not displayed.   Liver Function Tests: Recent Labs    10/09/20 1300 11/10/20 0000 11/22/20 2044 11/23/20 1628  AST '19 21 24 20  ' ALT 33 '26 29 27  ' ALKPHOS 77 103 81 79  BILITOT 0.8  --  1.0 0.9  PROT 6.7  --  6.9 6.9  ALBUMIN 3.8  --  3.8 3.8   No results for input(s): LIPASE, AMYLASE in the last 8760 hours. No results for input(s): AMMONIA in the last 8760 hours. CBC: Recent Labs    10/09/20 1300 11/10/20 0000 11/22/20 2044 11/23/20 1628  WBC 5.9 4.8 5.1 4.5  NEUTROABS 4.0 2.90  --  2.7  HGB 12.3* 13.1* 13.6 13.4  HCT 37.5* 38* 41.4 40.7  MCV 93.1  --  93.7 93.8  PLT 201 224 217 212   Lipid Panel: Recent Labs    11/10/20 0000  CHOL 119   Lab Results  Component Value Date   HGBA1C 6.8 (H) 09/06/2020    Procedures since last visit: CUP [place]  Result Date: 06/07/2021 ILR summary report received. Battery status OK. Normal device function. No  new symptom, tachy, brady, or pause episodes. No new AF episodes. Monthly summary reports and ROV/PRN LA   Assessment/Plan  1. History of CVA (cerebrovascular accident) -   continue Plavix, ASA and Atorvastatin - For home use only DME Hospital bed  2. Late effects of CVA (cerebrovascular accident) -  has right hemiplegia, continue supportive care - For home use only DME Hospital bed  3. Type 2 diabetes mellitus with peripheral neuropathy (HCC) Lab Results  Component Value Date   HGBA1C 6.8 (H) 09/06/2020   -  continue Glucotrol, Tradjenta and Novolog sliding scale  -  monitor CBGs  4. Wound of left lower extremity, initial encounter -  instructed daughter with wound treatment:   Apply Xeroform dressing to  right lower leg wound and cover with acre wrap and coband. Change dressing every 3 days and PRN - Bismuth Tribromoph-Petrolatum (XEROFORM OCCLUSIVE GAUZE PATCH) PADS; Apply 1 patch topically every 3 (three) days.  Dispense: 25 each; Refill: 1 -  Instructed daughter to make an appointment with a provider in [place], [place] for wound check and labs   Labs/tests ordered:  None  Next appt:  as needed

## 2021-06-17 NOTE — Telephone Encounter (Signed)
Patient was seen in office 06/11/2021 by Ella Bodo, NP

## 2021-07-06 ENCOUNTER — Other Ambulatory Visit: Payer: Self-pay | Admitting: Family

## 2021-07-06 ENCOUNTER — Other Ambulatory Visit: Payer: Self-pay | Admitting: Adult Health

## 2021-07-06 DIAGNOSIS — F5101 Primary insomnia: Secondary | ICD-10-CM

## 2021-07-06 DIAGNOSIS — I6990 Unspecified sequelae of unspecified cerebrovascular disease: Secondary | ICD-10-CM

## 2021-07-12 ENCOUNTER — Ambulatory Visit (INDEPENDENT_AMBULATORY_CARE_PROVIDER_SITE_OTHER): Payer: Medicare Other

## 2021-07-12 DIAGNOSIS — Z8673 Personal history of transient ischemic attack (TIA), and cerebral infarction without residual deficits: Secondary | ICD-10-CM

## 2021-07-12 LAB — CUP PACEART REMOTE DEVICE CHECK
Date Time Interrogation Session: 20230312231308
Implantable Pulse Generator Implant Date: 20201106

## 2021-07-23 NOTE — Progress Notes (Signed)
Carelink Summary Report / Loop Recorder 

## 2021-08-18 ENCOUNTER — Other Ambulatory Visit: Payer: Self-pay | Admitting: Family

## 2021-08-18 DIAGNOSIS — I6990 Unspecified sequelae of unspecified cerebrovascular disease: Secondary | ICD-10-CM

## 2021-08-18 DIAGNOSIS — I152 Hypertension secondary to endocrine disorders: Secondary | ICD-10-CM

## 2021-10-12 IMAGING — CT CT HEAD W/O CM
3 of 4 series · 14 of 37 positions shown, 16 images · non-contrast
Comparison: August 09, 2020

CLINICAL DATA: Altered mental status.

EXAM:
CT HEAD WITHOUT CONTRAST
TECHNIQUE: Contiguous axial images were obtained from the base of the skull
through the vertex without intravenous contrast.

[Series 4: head without · axial · non-contrast · 0.41mm/px · z∈[-58,+48]mm · 4 of 37 slices shown]
[im 8/37  brain]
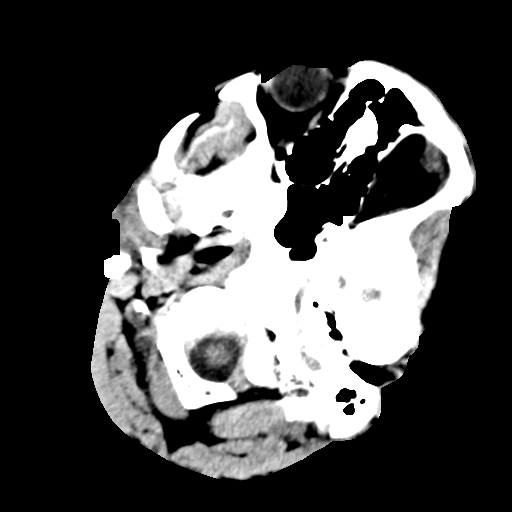
[im 15/37  brain]
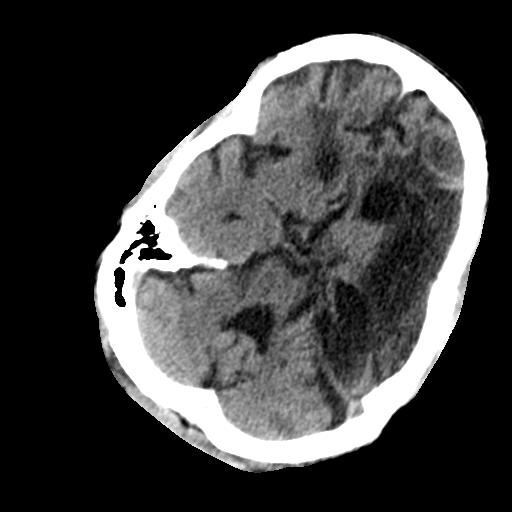
[im 22/37  brain]
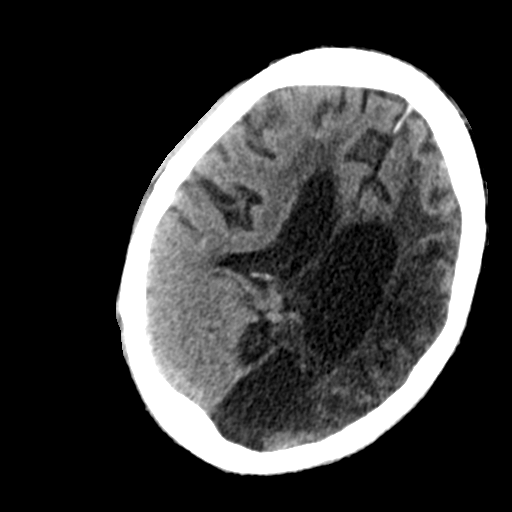
[im 29/37  brain]
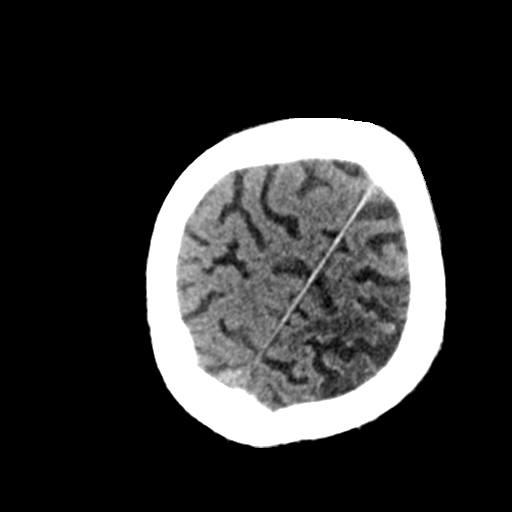

[Series 7: head without sag · sagittal · non-contrast · 0.34mm/px · 3 of 57 slices shown]
[im 21/57  brain]
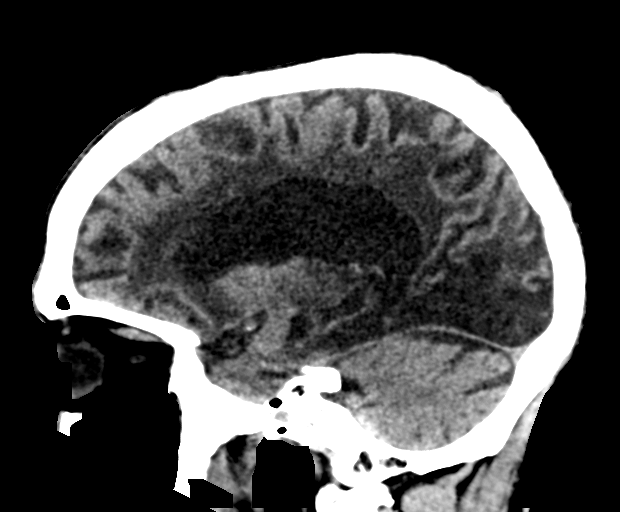
[im 29/57  brain]
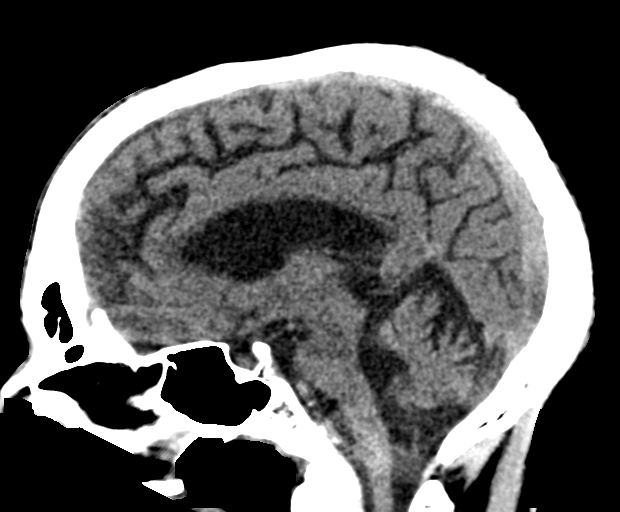
[im 37/57  brain]
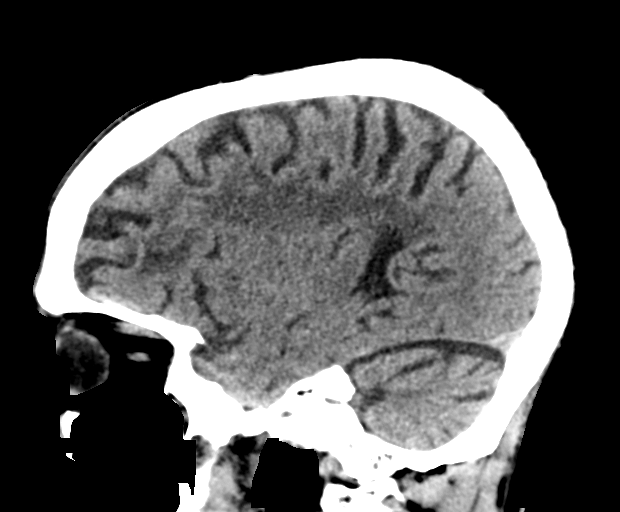

[Series 8: head without ax · axial · non-contrast · 0.33mm/px · z∈[-92,+22]mm · 7 of 59 slices shown, 9 images]
[im 8/59  brain]
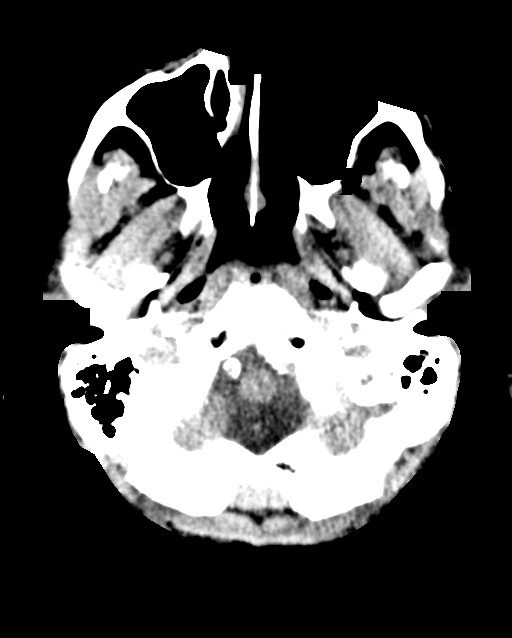
[im 8/59  bone]
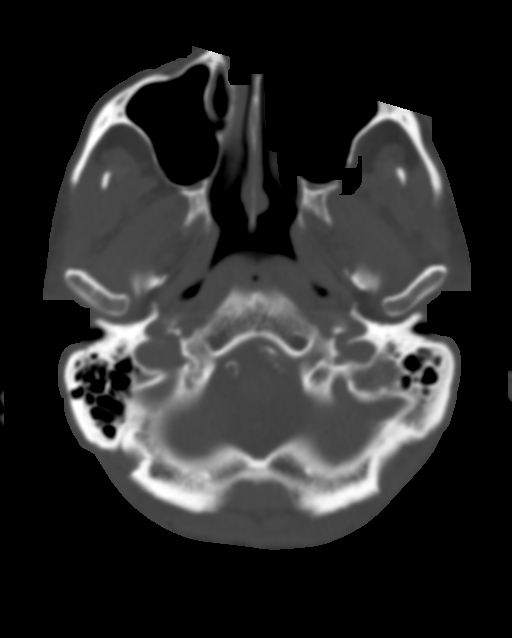
[im 15/59  brain]
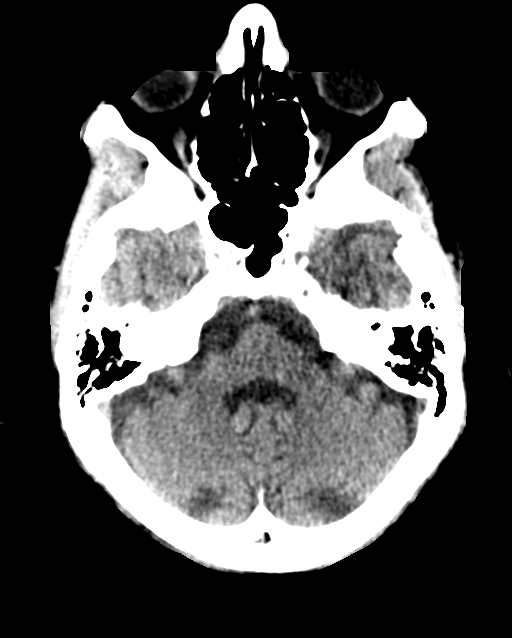
[im 22/59  brain]
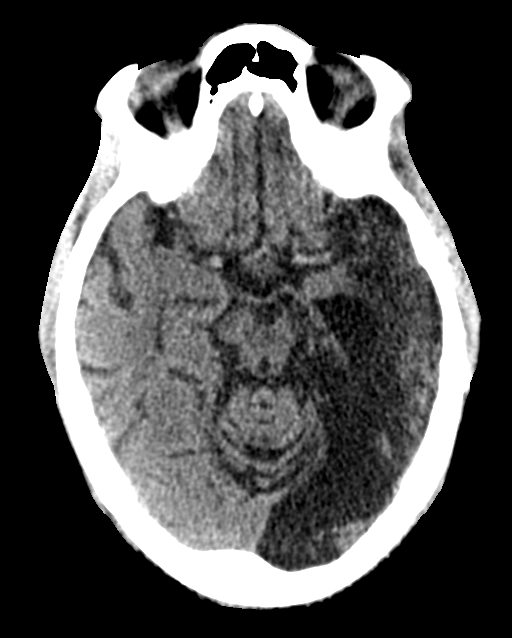
[im 30/59  brain]
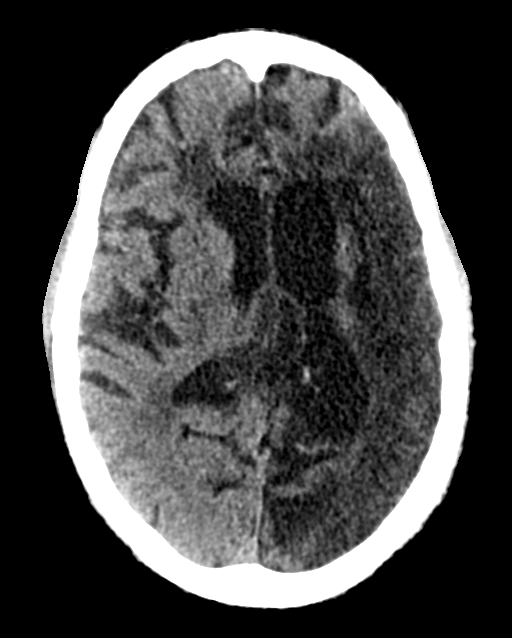
[im 37/59  brain]
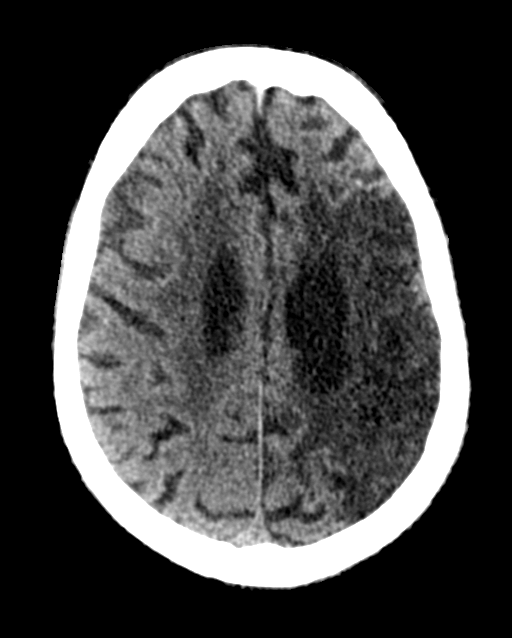
[im 37/59  bone]
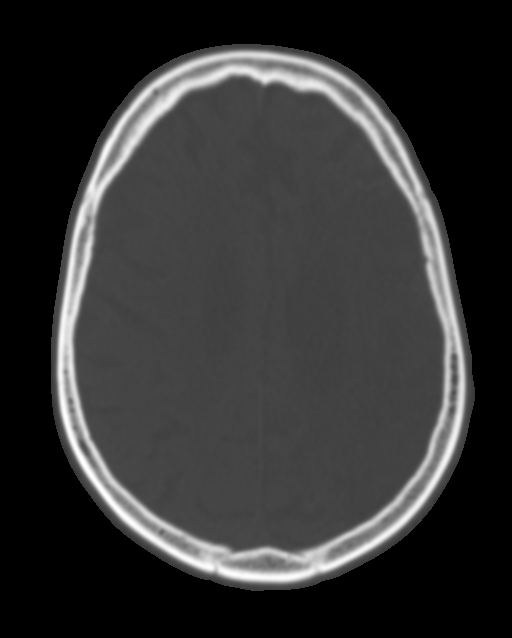
[im 44/59  brain]
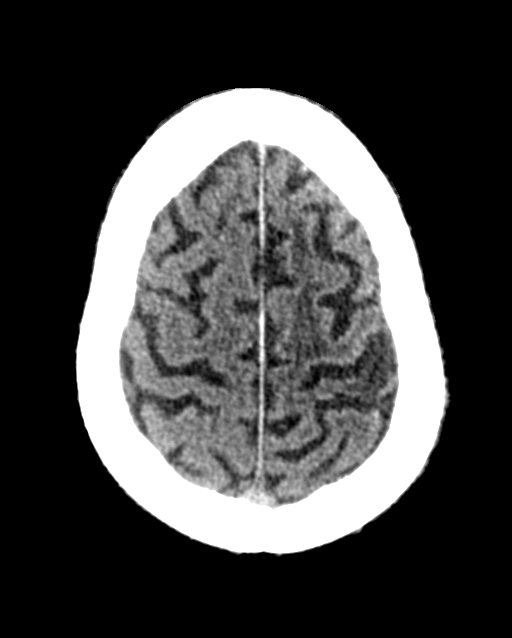
[im 51/59  brain]
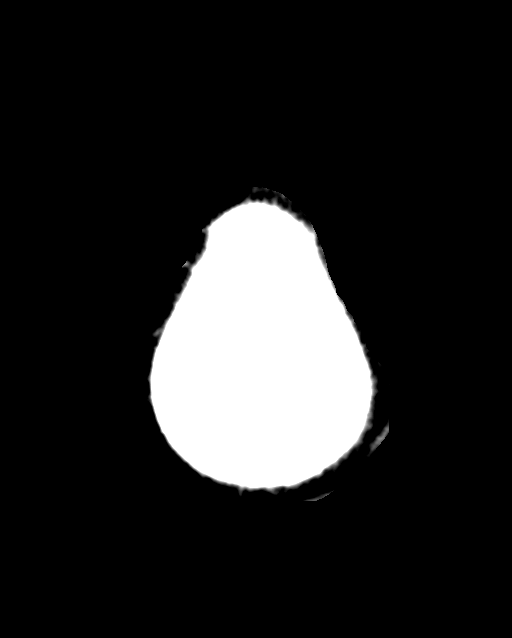

[14 of 37 positions shown; findings below may reference images not displayed]

FINDINGS: Brain: There is mild cerebral atrophy with widening of the
extra-axial spaces and ventricular dilatation.
There are areas of decreased attenuation within the white matter
tracts of the supratentorial brain, consistent with microvascular
disease changes.

A large area of cortical encephalomalacia and adjacent chronic white
matter low attenuation is seen throughout the left hemisphere,
involving the left MCA and left PCA territories. Associated ex vacuo
dilatation of the left lateral ventricle is seen.

Vascular: No hyperdense vessel or unexpected calcification.

Skull: Normal. Negative for fracture or focal lesion.

Sinuses/Orbits: No acute finding.

Other: None.
IMPRESSION: 1. Generalized cerebral atrophy.
2. Large chronic left MCA and left PCA territory infarct.
3. No acute intracranial abnormality.

## 2022-07-29 ENCOUNTER — Other Ambulatory Visit: Payer: Self-pay | Admitting: Pharmacist

## 2022-07-29 NOTE — Progress Notes (Signed)
Patient appearing on report for levemir usage in setting of upcoming levemir discontinuation.  Outreached patient to discuss glucose control and medication management as well as offer support during transition to alternative insulin. Inbox full, was unable to leave voicemail for patient to return my call at their convenience.   Will also send mychart as well as recommendation to PCP.  Larinda Buttery, PharmD, BCPS Clinical Pharmacist [facility]

## 2022-08-10 ENCOUNTER — Telehealth: Payer: Medicare Other

## 2022-08-10 ENCOUNTER — Other Ambulatory Visit: Payer: Self-pay

## 2022-08-10 MED ORDER — LANTUS SOLOSTAR 100 UNIT/ML ~~LOC~~ SOPN: 5.0000 [IU] | PEN_INJECTOR | Freq: Every day | SUBCUTANEOUS | 99 refills | Status: AC

## 2022-08-10 NOTE — Telephone Encounter (Signed)
I tried calling patient's daughter to inform of the change in insulin due to manufacturer no longer providing levemir. Mailbox was full and unable to leave a message. Medication has been changed to Lnatus 5 units daily and sent to pharmacy.
